# Patient Record
Sex: Male | Born: 1980 | Race: Black or African American | Hispanic: No | Marital: Married | Smoking: Former smoker
Health system: Southern US, Community
[De-identification: ages and names within clinical notes are randomized; demographics above are authoritative.]

## PROBLEM LIST (undated history)

## (undated) DIAGNOSIS — E291 Testicular hypofunction: Secondary | ICD-10-CM

## (undated) DIAGNOSIS — N3281 Overactive bladder: Secondary | ICD-10-CM

## (undated) DIAGNOSIS — B20 Human immunodeficiency virus [HIV] disease: Secondary | ICD-10-CM

## (undated) DIAGNOSIS — N400 Enlarged prostate without lower urinary tract symptoms: Secondary | ICD-10-CM

## (undated) DIAGNOSIS — G894 Chronic pain syndrome: Secondary | ICD-10-CM

## (undated) DIAGNOSIS — E669 Obesity, unspecified: Secondary | ICD-10-CM

## (undated) DIAGNOSIS — N529 Male erectile dysfunction, unspecified: Secondary | ICD-10-CM

## (undated) DIAGNOSIS — Z0282 Encounter for adoption services: Secondary | ICD-10-CM

## (undated) DIAGNOSIS — L408 Other psoriasis: Secondary | ICD-10-CM

## (undated) DIAGNOSIS — I5030 Unspecified diastolic (congestive) heart failure: Secondary | ICD-10-CM

## (undated) DIAGNOSIS — F329 Major depressive disorder, single episode, unspecified: Secondary | ICD-10-CM

## (undated) DIAGNOSIS — I119 Hypertensive heart disease without heart failure: Secondary | ICD-10-CM

## (undated) DIAGNOSIS — IMO0002 Reserved for concepts with insufficient information to code with codable children: Secondary | ICD-10-CM

## (undated) DIAGNOSIS — G40909 Epilepsy, unspecified, not intractable, without status epilepticus: Secondary | ICD-10-CM

## (undated) DIAGNOSIS — I509 Heart failure, unspecified: Secondary | ICD-10-CM

## (undated) DIAGNOSIS — E1149 Type 2 diabetes mellitus with other diabetic neurological complication: Secondary | ICD-10-CM

## (undated) DIAGNOSIS — G4730 Sleep apnea, unspecified: Secondary | ICD-10-CM

## (undated) DIAGNOSIS — G459 Transient cerebral ischemic attack, unspecified: Secondary | ICD-10-CM

## (undated) DIAGNOSIS — E0590 Thyrotoxicosis, unspecified without thyrotoxic crisis or storm: Secondary | ICD-10-CM

## (undated) DIAGNOSIS — Z789 Other specified health status: Secondary | ICD-10-CM

## (undated) DIAGNOSIS — N289 Disorder of kidney and ureter, unspecified: Secondary | ICD-10-CM

## (undated) DIAGNOSIS — E785 Hyperlipidemia, unspecified: Secondary | ICD-10-CM

## (undated) DIAGNOSIS — E119 Type 2 diabetes mellitus without complications: Secondary | ICD-10-CM

## (undated) DIAGNOSIS — Q743 Arthrogryposis multiplex congenita: Secondary | ICD-10-CM

## (undated) DIAGNOSIS — I43 Cardiomyopathy in diseases classified elsewhere: Secondary | ICD-10-CM

## (undated) DIAGNOSIS — N3289 Other specified disorders of bladder: Secondary | ICD-10-CM

## (undated) DIAGNOSIS — A6000 Herpesviral infection of urogenital system, unspecified: Secondary | ICD-10-CM

## (undated) DIAGNOSIS — J40 Bronchitis, not specified as acute or chronic: Secondary | ICD-10-CM

## (undated) DIAGNOSIS — Z21 Asymptomatic human immunodeficiency virus [HIV] infection status: Secondary | ICD-10-CM

## (undated) DIAGNOSIS — J309 Allergic rhinitis, unspecified: Secondary | ICD-10-CM

## (undated) DIAGNOSIS — Q688 Other specified congenital musculoskeletal deformities: Secondary | ICD-10-CM

## (undated) DIAGNOSIS — K219 Gastro-esophageal reflux disease without esophagitis: Secondary | ICD-10-CM

## (undated) DIAGNOSIS — K5732 Diverticulitis of large intestine without perforation or abscess without bleeding: Secondary | ICD-10-CM

## (undated) DIAGNOSIS — Z9289 Personal history of other medical treatment: Secondary | ICD-10-CM

## (undated) DIAGNOSIS — F419 Anxiety disorder, unspecified: Secondary | ICD-10-CM

## (undated) DIAGNOSIS — M1990 Unspecified osteoarthritis, unspecified site: Secondary | ICD-10-CM

## (undated) DIAGNOSIS — IMO0001 Reserved for inherently not codable concepts without codable children: Secondary | ICD-10-CM

## (undated) DIAGNOSIS — F3289 Other specified depressive episodes: Secondary | ICD-10-CM

## (undated) HISTORY — DX: Transient cerebral ischemic attack, unspecified: G45.9

## (undated) HISTORY — DX: Asymptomatic human immunodeficiency virus (hiv) infection status: Z21

## (undated) HISTORY — PX: HAND SURGERY: SHX662

## (undated) HISTORY — PX: APPENDECTOMY: SHX54

## (undated) HISTORY — DX: Other specified disorders of bladder: N32.89

## (undated) HISTORY — DX: Unspecified osteoarthritis, unspecified site: M19.90

## (undated) HISTORY — DX: Hypertensive heart disease without heart failure: I43

## (undated) HISTORY — DX: Reserved for inherently not codable concepts without codable children: IMO0001

## (undated) HISTORY — DX: Chronic pain syndrome: G89.4

## (undated) HISTORY — DX: Overactive bladder: N32.81

## (undated) HISTORY — DX: Epilepsy, unspecified, not intractable, without status epilepticus: G40.909

## (undated) HISTORY — DX: Other specified congenital musculoskeletal deformities: Q68.8

## (undated) HISTORY — DX: Allergic rhinitis, unspecified: J30.9

## (undated) HISTORY — DX: Essential (primary) hypertension: I10

## (undated) HISTORY — DX: Obesity, unspecified: E66.9

## (undated) HISTORY — PX: ORTHOPEDIC SURGERY: SHX850

## (undated) HISTORY — DX: Other specified health status: Z78.9

## (undated) HISTORY — DX: Other psoriasis: L40.8

## (undated) HISTORY — DX: Reserved for concepts with insufficient information to code with codable children: IMO0002

## (undated) HISTORY — DX: Unspecified diastolic (congestive) heart failure: I50.30

## (undated) HISTORY — DX: Major depressive disorder, single episode, unspecified: F32.9

## (undated) HISTORY — DX: Benign prostatic hyperplasia without lower urinary tract symptoms: N40.0

## (undated) HISTORY — DX: Encounter for adoption services: Z02.82

## (undated) HISTORY — DX: Personal history of other medical treatment: Z92.89

## (undated) HISTORY — PX: CORNEAL TRANSPLANT: SHX108

## (undated) HISTORY — DX: Anxiety disorder, unspecified: F41.9

## (undated) HISTORY — DX: Thyrotoxicosis, unspecified without thyrotoxic crisis or storm: E05.90

## (undated) HISTORY — PX: COLOSTOMY: SHX63

## (undated) HISTORY — DX: Sleep apnea, unspecified: G47.30

## (undated) HISTORY — DX: Male erectile dysfunction, unspecified: N52.9

## (undated) HISTORY — DX: Hyperlipidemia, unspecified: E78.5

## (undated) HISTORY — DX: Testicular hypofunction: E29.1

## (undated) HISTORY — DX: Diverticulitis of large intestine without perforation or abscess without bleeding: K57.32

## (undated) HISTORY — DX: Other specified depressive episodes: F32.89

## (undated) HISTORY — PX: OTHER SURGICAL HISTORY: SHX169

## (undated) HISTORY — DX: Herpesviral infection of urogenital system, unspecified: A60.00

## (undated) HISTORY — DX: Type 2 diabetes mellitus with other diabetic neurological complication: E11.49

## (undated) HISTORY — DX: Hypertensive heart disease without heart failure: I11.9

## (undated) HISTORY — DX: Human immunodeficiency virus (HIV) disease: B20

## (undated) HISTORY — DX: Gastro-esophageal reflux disease without esophagitis: K21.9

---

## 2003-12-19 ENCOUNTER — Emergency Department: Payer: Self-pay | Admitting: Unknown Physician Specialty

## 2004-08-08 ENCOUNTER — Emergency Department: Payer: Self-pay | Admitting: Unknown Physician Specialty

## 2004-10-02 ENCOUNTER — Emergency Department: Payer: Self-pay | Admitting: Emergency Medicine

## 2004-12-11 ENCOUNTER — Emergency Department: Payer: Self-pay | Admitting: Unknown Physician Specialty

## 2005-05-02 ENCOUNTER — Emergency Department: Payer: Self-pay | Admitting: Unknown Physician Specialty

## 2005-06-18 ENCOUNTER — Emergency Department: Payer: Self-pay | Admitting: Emergency Medicine

## 2005-06-27 DIAGNOSIS — I10 Essential (primary) hypertension: Secondary | ICD-10-CM | POA: Insufficient documentation

## 2005-07-04 ENCOUNTER — Emergency Department: Payer: Self-pay | Admitting: Internal Medicine

## 2005-08-03 ENCOUNTER — Emergency Department: Payer: Self-pay | Admitting: Unknown Physician Specialty

## 2005-08-21 ENCOUNTER — Emergency Department: Payer: Self-pay | Admitting: Emergency Medicine

## 2005-08-22 ENCOUNTER — Emergency Department: Payer: Self-pay | Admitting: Unknown Physician Specialty

## 2006-02-14 ENCOUNTER — Emergency Department: Payer: Self-pay | Admitting: Emergency Medicine

## 2006-06-06 ENCOUNTER — Emergency Department: Payer: Self-pay

## 2006-07-28 ENCOUNTER — Emergency Department: Payer: Self-pay | Admitting: Emergency Medicine

## 2006-08-28 DIAGNOSIS — E1169 Type 2 diabetes mellitus with other specified complication: Secondary | ICD-10-CM | POA: Diagnosis present

## 2007-02-12 ENCOUNTER — Emergency Department: Payer: Self-pay | Admitting: Emergency Medicine

## 2007-05-14 ENCOUNTER — Emergency Department: Payer: Self-pay | Admitting: Emergency Medicine

## 2007-09-14 ENCOUNTER — Emergency Department: Payer: Self-pay | Admitting: Emergency Medicine

## 2007-10-05 ENCOUNTER — Emergency Department: Payer: Self-pay | Admitting: Emergency Medicine

## 2007-10-17 ENCOUNTER — Emergency Department: Payer: Self-pay

## 2007-11-27 ENCOUNTER — Emergency Department: Payer: Self-pay | Admitting: Emergency Medicine

## 2007-12-28 ENCOUNTER — Emergency Department: Payer: Self-pay | Admitting: Emergency Medicine

## 2008-01-18 DIAGNOSIS — J45901 Unspecified asthma with (acute) exacerbation: Secondary | ICD-10-CM | POA: Diagnosis present

## 2008-02-15 ENCOUNTER — Emergency Department: Payer: Self-pay | Admitting: Emergency Medicine

## 2008-02-21 ENCOUNTER — Emergency Department: Payer: Self-pay | Admitting: Emergency Medicine

## 2008-02-22 ENCOUNTER — Emergency Department: Payer: Self-pay | Admitting: Emergency Medicine

## 2008-05-20 ENCOUNTER — Emergency Department: Payer: Self-pay | Admitting: Emergency Medicine

## 2008-05-27 ENCOUNTER — Emergency Department: Payer: Self-pay | Admitting: Emergency Medicine

## 2008-06-26 ENCOUNTER — Emergency Department: Payer: Self-pay | Admitting: Emergency Medicine

## 2008-06-28 ENCOUNTER — Emergency Department: Payer: Self-pay | Admitting: Emergency Medicine

## 2008-08-16 ENCOUNTER — Emergency Department: Payer: Self-pay | Admitting: Emergency Medicine

## 2008-09-10 ENCOUNTER — Emergency Department: Payer: Self-pay | Admitting: Emergency Medicine

## 2008-10-08 ENCOUNTER — Emergency Department: Payer: Self-pay | Admitting: Emergency Medicine

## 2008-10-16 ENCOUNTER — Observation Stay: Payer: Self-pay | Admitting: Student

## 2008-10-31 ENCOUNTER — Emergency Department: Payer: Self-pay | Admitting: Emergency Medicine

## 2008-12-05 ENCOUNTER — Ambulatory Visit: Payer: Self-pay | Admitting: Cardiology

## 2008-12-05 ENCOUNTER — Inpatient Hospital Stay: Payer: Self-pay | Admitting: Internal Medicine

## 2008-12-22 ENCOUNTER — Ambulatory Visit: Payer: Self-pay | Admitting: Internal Medicine

## 2008-12-24 ENCOUNTER — Emergency Department: Payer: Self-pay | Admitting: Unknown Physician Specialty

## 2008-12-30 ENCOUNTER — Emergency Department: Payer: Self-pay | Admitting: Emergency Medicine

## 2009-03-26 ENCOUNTER — Emergency Department: Payer: Self-pay | Admitting: Emergency Medicine

## 2009-06-26 ENCOUNTER — Emergency Department: Payer: Self-pay | Admitting: Emergency Medicine

## 2009-07-10 ENCOUNTER — Emergency Department: Payer: Self-pay | Admitting: Emergency Medicine

## 2009-11-02 ENCOUNTER — Emergency Department (HOSPITAL_COMMUNITY): Admission: EM | Admit: 2009-11-02 | Discharge: 2009-11-03 | Payer: Self-pay | Admitting: Emergency Medicine

## 2010-02-11 ENCOUNTER — Emergency Department: Payer: Self-pay | Admitting: Emergency Medicine

## 2010-02-25 ENCOUNTER — Emergency Department: Payer: Self-pay | Admitting: Emergency Medicine

## 2010-03-24 ENCOUNTER — Inpatient Hospital Stay: Payer: Self-pay | Admitting: *Deleted

## 2010-03-31 ENCOUNTER — Emergency Department: Payer: Self-pay | Admitting: Emergency Medicine

## 2010-04-24 ENCOUNTER — Emergency Department: Payer: Self-pay | Admitting: Emergency Medicine

## 2010-04-29 ENCOUNTER — Emergency Department: Payer: Self-pay | Admitting: Emergency Medicine

## 2010-05-04 ENCOUNTER — Inpatient Hospital Stay: Payer: Self-pay | Admitting: Internal Medicine

## 2010-11-24 ENCOUNTER — Emergency Department: Payer: Self-pay | Admitting: Emergency Medicine

## 2010-12-16 ENCOUNTER — Inpatient Hospital Stay: Payer: Self-pay | Admitting: Specialist

## 2010-12-27 ENCOUNTER — Inpatient Hospital Stay: Payer: Self-pay | Admitting: *Deleted

## 2011-02-09 ENCOUNTER — Emergency Department: Payer: Self-pay | Admitting: Unknown Physician Specialty

## 2011-02-09 LAB — CBC
HCT: 43.8 % (ref 40.0–52.0)
MCH: 27.6 pg (ref 26.0–34.0)
MCHC: 32.8 g/dL (ref 32.0–36.0)
MCV: 84 fL (ref 80–100)
Platelet: 240 10*3/uL (ref 150–440)
RDW: 13.8 % (ref 11.5–14.5)

## 2011-02-09 LAB — BASIC METABOLIC PANEL
Anion Gap: 9 (ref 7–16)
BUN: 11 mg/dL (ref 7–18)
Calcium, Total: 8.9 mg/dL (ref 8.5–10.1)
Chloride: 100 mmol/L (ref 98–107)
Co2: 31 mmol/L (ref 21–32)
EGFR (African American): >60
EGFR (Non-African Amer.): >60
Glucose: 237 mg/dL — ABNORMAL HIGH (ref 65–99)
Osmolality: 286 (ref 275–301)
Potassium: 3.3 mmol/L — ABNORMAL LOW (ref 3.5–5.1)
Sodium: 140 mmol/L (ref 136–145)

## 2011-02-09 LAB — URINALYSIS, COMPLETE
Ketone: NEGATIVE
Ph: 7 (ref 4.5–8.0)
Protein: NEGATIVE
Specific Gravity: 1.015 (ref 1.003–1.030)
Squamous Epithelial: NONE SEEN
WBC UR: 1 /HPF (ref 0–5)

## 2011-02-10 LAB — URINE CULTURE

## 2011-04-17 ENCOUNTER — Emergency Department: Payer: Self-pay | Admitting: Emergency Medicine

## 2011-04-17 LAB — COMPREHENSIVE METABOLIC PANEL
Albumin: 3.9 g/dL (ref 3.4–5.0)
Anion Gap: 8 (ref 7–16)
BUN: 13 mg/dL (ref 7–18)
Calcium, Total: 9.2 mg/dL (ref 8.5–10.1)
Chloride: 104 mmol/L (ref 98–107)
Co2: 33 mmol/L — ABNORMAL HIGH (ref 21–32)
EGFR (African American): >60
EGFR (Non-African Amer.): >60
Glucose: 124 mg/dL — ABNORMAL HIGH (ref 65–99)
Osmolality: 290 (ref 275–301)
Potassium: 3.3 mmol/L — ABNORMAL LOW (ref 3.5–5.1)
SGOT(AST): 23 U/L (ref 15–37)
SGPT (ALT): 31 U/L
Sodium: 145 mmol/L (ref 136–145)
Total Protein: 6.8 g/dL (ref 6.4–8.2)

## 2011-04-17 LAB — URINALYSIS, COMPLETE
Glucose,UR: NEGATIVE mg/dL (ref 0–75)
Leukocyte Esterase: NEGATIVE
Nitrite: NEGATIVE
Ph: 6 (ref 4.5–8.0)
Protein: NEGATIVE
Specific Gravity: 1.025 (ref 1.003–1.030)
WBC UR: <1 /HPF (ref 0–5)

## 2011-04-17 LAB — CBC
HCT: 41.7 % (ref 40.0–52.0)
MCH: 27.8 pg (ref 26.0–34.0)
MCV: 85 fL (ref 80–100)
Platelet: 225 10*3/uL (ref 150–440)
RBC: 4.91 10*6/uL (ref 4.40–5.90)
RDW: 13.8 % (ref 11.5–14.5)

## 2011-04-27 ENCOUNTER — Emergency Department: Payer: Self-pay | Admitting: Emergency Medicine

## 2011-04-27 LAB — URINALYSIS, COMPLETE
Bilirubin,UR: NEGATIVE
Blood: NEGATIVE
Leukocyte Esterase: NEGATIVE
Ph: 6 (ref 4.5–8.0)
RBC,UR: 1 /HPF (ref 0–5)
Specific Gravity: 1.031 (ref 1.003–1.030)
Squamous Epithelial: 2

## 2011-04-27 LAB — BASIC METABOLIC PANEL
Anion Gap: 12 (ref 7–16)
BUN: 12 mg/dL (ref 7–18)
Calcium, Total: 9.1 mg/dL (ref 8.5–10.1)
Chloride: 101 mmol/L (ref 98–107)
Creatinine: 0.79 mg/dL (ref 0.60–1.30)
EGFR (Non-African Amer.): >60
Glucose: 69 mg/dL (ref 65–99)
Osmolality: 285 (ref 275–301)
Sodium: 144 mmol/L (ref 136–145)

## 2011-04-27 LAB — CBC
HGB: 14.3 g/dL (ref 13.0–18.0)
MCH: 27.9 pg (ref 26.0–34.0)
MCHC: 32.9 g/dL (ref 32.0–36.0)
MCV: 85 fL (ref 80–100)
RBC: 5.12 10*6/uL (ref 4.40–5.90)

## 2011-04-27 LAB — RAPID INFLUENZA A&B ANTIGENS

## 2011-05-30 ENCOUNTER — Encounter: Payer: Self-pay | Admitting: *Deleted

## 2011-05-30 ENCOUNTER — Emergency Department: Payer: Self-pay | Admitting: Emergency Medicine

## 2011-05-30 LAB — COMPREHENSIVE METABOLIC PANEL
Albumin: 4.3 g/dL (ref 3.4–5.0)
Alkaline Phosphatase: 64 U/L (ref 50–136)
Anion Gap: 7 (ref 7–16)
BUN: 7 mg/dL (ref 7–18)
Chloride: 103 mmol/L (ref 98–107)
Creatinine: 0.62 mg/dL (ref 0.60–1.30)
EGFR (African American): >60
Osmolality: 283 (ref 275–301)
Sodium: 142 mmol/L (ref 136–145)

## 2011-05-30 LAB — CBC
HGB: 14.1 g/dL (ref 13.0–18.0)
MCV: 84 fL (ref 80–100)
RBC: 5.19 10*6/uL (ref 4.40–5.90)
WBC: 5.7 10*3/uL (ref 3.8–10.6)

## 2011-05-30 LAB — CK TOTAL AND CKMB (NOT AT ARMC): CK-MB: 2.7 ng/mL (ref 0.5–3.6)

## 2011-05-31 ENCOUNTER — Ambulatory Visit: Payer: Self-pay | Admitting: Cardiovascular Disease

## 2011-06-01 ENCOUNTER — Emergency Department: Payer: Self-pay | Admitting: Emergency Medicine

## 2011-06-01 LAB — BASIC METABOLIC PANEL
Anion Gap: 11 (ref 7–16)
Calcium, Total: 8.7 mg/dL (ref 8.5–10.1)
Chloride: 104 mmol/L (ref 98–107)
Co2: 25 mmol/L (ref 21–32)
EGFR (African American): >60
Osmolality: 280 (ref 275–301)
Potassium: 3.2 mmol/L — ABNORMAL LOW (ref 3.5–5.1)
Sodium: 140 mmol/L (ref 136–145)

## 2011-06-01 LAB — CBC
HCT: 43.4 % (ref 40.0–52.0)
MCH: 27.8 pg (ref 26.0–34.0)
MCHC: 33.1 g/dL (ref 32.0–36.0)
MCV: 84 fL (ref 80–100)
Platelet: 289 10*3/uL (ref 150–440)
WBC: 5.3 10*3/uL (ref 3.8–10.6)

## 2011-06-01 LAB — TROPONIN I: Troponin-I: <0.02 ng/mL

## 2011-06-13 ENCOUNTER — Ambulatory Visit: Payer: Self-pay | Admitting: Pain Medicine

## 2011-06-20 ENCOUNTER — Ambulatory Visit (INDEPENDENT_AMBULATORY_CARE_PROVIDER_SITE_OTHER): Payer: Medicaid Other | Admitting: Cardiovascular Disease

## 2011-06-20 ENCOUNTER — Encounter: Payer: Self-pay | Admitting: Cardiovascular Disease

## 2011-06-20 DIAGNOSIS — M5382 Other specified dorsopathies, cervical region: Secondary | ICD-10-CM

## 2011-06-20 DIAGNOSIS — E1159 Type 2 diabetes mellitus with other circulatory complications: Secondary | ICD-10-CM | POA: Insufficient documentation

## 2011-06-20 DIAGNOSIS — I152 Hypertension secondary to endocrine disorders: Secondary | ICD-10-CM | POA: Insufficient documentation

## 2011-06-20 DIAGNOSIS — E119 Type 2 diabetes mellitus without complications: Secondary | ICD-10-CM

## 2011-06-20 DIAGNOSIS — R079 Chest pain, unspecified: Secondary | ICD-10-CM

## 2011-06-20 DIAGNOSIS — R0602 Shortness of breath: Secondary | ICD-10-CM

## 2011-06-20 DIAGNOSIS — R29898 Other symptoms and signs involving the musculoskeletal system: Secondary | ICD-10-CM

## 2011-06-20 DIAGNOSIS — I10 Essential (primary) hypertension: Secondary | ICD-10-CM

## 2011-06-20 DIAGNOSIS — E118 Type 2 diabetes mellitus with unspecified complications: Secondary | ICD-10-CM | POA: Insufficient documentation

## 2011-06-20 MED ORDER — ISOSORBIDE MONONITRATE ER 30 MG PO TB24: 30.0000 mg | ORAL_TABLET | Freq: Two times a day (BID) | ORAL | Status: DC

## 2011-06-20 NOTE — Assessment & Plan Note (Signed)
For his chest pain and blood pressure, we have suggested he start isosorbide mononitrate 30 mg daily. He will monitor his blood pressure and if it continues to be elevated, he will increase the isosorbide to twice a day.

## 2011-06-20 NOTE — Patient Instructions (Signed)
Please start isosorbide once a day Monitor your blood pressure for the next week. If it continues to run high in one week, start isosorbide twice a day  We will schedule you for a stress test at [facility]  Please call us if you have new issues that need to be addressed before your next appt.  Your physician wants you to follow-up in: 3 months.  You will receive a reminder letter in the mail two months in advance. If you don't receive a letter, please call our office to schedule the follow-up appointment.

## 2011-06-20 NOTE — Assessment & Plan Note (Signed)
Etiology of his chest pain is uncertain. He does have several risk factors for coronary artery disease including diabetes, hypertension. He is unable to treadmill and we will schedule him for a Myoview at his convenience.

## 2011-06-20 NOTE — Assessment & Plan Note (Signed)
Long history of diabetes. He does work out with [employer] at [employer]We have encouraged continued exercise, careful diet management in an effort to lose weight.

## 2011-06-20 NOTE — Progress Notes (Signed)
Patient ID: Cory Campbell, male    DOB: Dec 15, 1980, 31 y.o.   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 He presents for further evaluation of chest pain.  He reports that symptoms started approximately 2 months ago, occurred 3 times per week lasting 5-10 minutes at a time. Symptoms are sharp, come on at rest and sometimes with exertion. He describes the symptoms as very severe. He denies any significant lower extremity edema, wears braces in both legs, no significant lightheadedness, sweating.   Workup in the hospital was essentially negative with normal cardiac enzymes.  He reports that his biggest problem is his blood pressure which is always elevated  EKG shows normal sinus rhythm with rate 74 beats per minute poor R-wave progression through the anterior precordial leads, nonspecific ST and T wave abnormality   Outpatient Encounter Prescriptions as of 06/20/2011  Medication Sig Dispense Refill  . acetaminophen-codeine (TYLENOL #3) 300-30 MG per tablet Take 1 tablet by mouth every 4 (four) hours as needed.      Marland Kitchen albuterol (PROVENTIL HFA;VENTOLIN HFA) 108 (90 BASE) MCG/ACT inhaler Inhale 2 puffs into the lungs every 4 (four) hours as needed.      Marland Kitchen amLODipine (NORVASC) 10 MG tablet Take 10 mg by mouth daily.      Marland Kitchen aspirin 325 MG tablet Take 325 mg by mouth daily.      . beclomethasone (QVAR) 80 MCG/ACT inhaler Inhale 2 puffs into the lungs as needed.      . carbamazepine (CARBATROL) 200 MG 12 hr capsule Take one tablet in the am, one tablet in the afternoon and two tablets in the pm.      . cetirizine (ZYRTEC) 10 MG  chewable tablet Chew 10 mg by mouth daily.      . chlorthalidone (HYGROTON) 25 MG tablet Take 25 mg by mouth daily.      . clobetasol cream (TEMOVATE) 0.05 % Apply topically 2 (two) times daily.      . fluticasone (FLONASE) 50 MCG/ACT nasal spray Place 2 sprays into the nose daily.      Marland Kitchen glucose blood test strip 1 each by Other route as needed. Use as instructed      . hydrALAZINE (APRESOLINE) 50 MG tablet Take 50 mg by mouth 3 (three) times daily.      . insulin aspart (NOVOLOG FLEXPEN) 100 UNIT/ML injection Inject 15 Units into the skin 3 (three) times daily before meals. 15 units before lunch, 25 units before dinner and 5 units with snacks.      . insulin glargine (LANTUS) 100 UNIT/ML injection Inject 60 Units into the skin at bedtime.      . Insulin Pen Needle (NOVOFINE) 30G X 8 MM MISC Inject 1 packet into the skin 4 (four) times daily.      Marland Kitchen lisinopril (PRINIVIL,ZESTRIL) 40 MG tablet Take 40 mg by mouth daily.      . metoprolol (LOPRESSOR) 100 MG tablet Take 100 mg by mouth 2 (two) times daily.      . Multiple Vitamin (MULTIVITAMIN) tablet Take 1 tablet by mouth daily.      Marland Kitchen omeprazole (PRILOSEC) 20 MG capsule Take 20  mg by mouth daily.      Marland Kitchen oxybutynin (DITROPAN) 5 MG tablet Take 5 mg by mouth 4 (four) times daily.      . pravastatin (PRAVACHOL) 80 MG tablet Take 80 mg by mouth daily.      . sildenafil (VIAGRA) 100 MG tablet Take 50 mg by mouth daily as needed.      Marland Kitchen spironolactone (ALDACTONE) 25 MG tablet Take 25 mg by mouth daily.      . traZODone (DESYREL) 50 MG tablet Take 25 mg by mouth 2 (two) times daily.         Review of Systems  Constitutional: Negative.   HENT: Negative.   Eyes: Negative.   Respiratory: Negative.   Cardiovascular: Positive for chest pain.  Gastrointestinal: Negative.   Musculoskeletal: Positive for gait problem.       Severe leg pain, chronic  Skin: Negative.   Neurological: Negative.   Hematological: Negative.   Psychiatric/Behavioral:  Negative.   All other systems reviewed and are negative.    BP 159/105  Pulse 74  Ht 5\' 4"  (1.626 m)  Wt 213 lb (96.616 kg)  BMI 36.56 kg/m2  Physical Exam  Nursing note and vitals reviewed. Constitutional: He is oriented to person, place, and time. He appears well-developed and well-nourished.       Presents with arm crutches bilaterally  HENT:  Head: Normocephalic.  Nose: Nose normal.  Mouth/Throat: Oropharynx is clear and moist.  Eyes: Conjunctivae are normal. Pupils are equal, round, and reactive to light.  Neck: Normal range of motion. Neck supple. No JVD present.  Cardiovascular: Normal rate, regular rhythm, S1 normal, S2 normal, normal heart sounds and intact distal pulses.  Exam reveals no gallop and no friction rub.   No murmur heard. Pulmonary/Chest: Effort normal and breath sounds normal. No respiratory distress. He has no wheezes. He has no rales. He exhibits no tenderness.  Abdominal: Soft. Bowel sounds are normal. He exhibits no distension. There is no tenderness.  Musculoskeletal: He exhibits no edema and no tenderness.       In the legs, in braces bilaterally  Lymphadenopathy:    He has no cervical adenopathy.  Neurological: He is alert and oriented to person, place, and time. Coordination abnormal.  Skin: Skin is warm and dry. No rash noted. No erythema.  Psychiatric: He has a normal mood and affect. His behavior is normal. Judgment and thought content normal.           Assessment and Plan

## 2011-06-28 ENCOUNTER — Ambulatory Visit: Payer: Self-pay | Admitting: Cardiovascular Disease

## 2011-06-28 DIAGNOSIS — R079 Chest pain, unspecified: Secondary | ICD-10-CM

## 2011-06-30 ENCOUNTER — Telehealth: Payer: Self-pay | Admitting: Cardiovascular Disease

## 2011-06-30 NOTE — Telephone Encounter (Signed)
Pt called stating that his isosorbide is giving him a headache and wants to talk to the nurse about it

## 2011-06-30 NOTE — Telephone Encounter (Signed)
Pt c/o h/a since starting isosorbide QD. BP is better (120/86) but having a hard time tolerating h/a.  Has yet to try taking isosorbide BID.  I will discuss with Dr. Mariah Milling and see if he wants to make any changes and will then call patient back.  Understanding verb.

## 2011-06-30 NOTE — Telephone Encounter (Signed)
He could try cutting it in half to start for a while. Monitor BP on 1/2 pill Call back with numbers. See if his headache resolves.

## 2011-06-30 NOTE — Telephone Encounter (Signed)
Please review below and advise.  Thanks!

## 2011-07-04 ENCOUNTER — Telehealth: Payer: Self-pay

## 2011-07-04 NOTE — Telephone Encounter (Signed)
LMOM per Dr. Kizzie Furnish from [facility] looked normal.

## 2011-07-04 NOTE — Telephone Encounter (Signed)
Pt informed Understanding verb 

## 2011-07-04 NOTE — Telephone Encounter (Signed)
Pt unavail.

## 2011-07-10 ENCOUNTER — Other Ambulatory Visit: Payer: Self-pay | Admitting: Cardiovascular Disease

## 2011-07-10 DIAGNOSIS — R0602 Shortness of breath: Secondary | ICD-10-CM

## 2011-07-24 ENCOUNTER — Ambulatory Visit: Payer: Self-pay | Admitting: Neurology

## 2011-09-06 ENCOUNTER — Ambulatory Visit: Payer: Self-pay | Admitting: Gastroenterology

## 2011-09-22 ENCOUNTER — Ambulatory Visit: Payer: Medicaid Other | Admitting: Cardiovascular Disease

## 2011-09-28 ENCOUNTER — Ambulatory Visit: Payer: Medicaid Other | Admitting: Cardiovascular Disease

## 2011-11-07 ENCOUNTER — Ambulatory Visit: Payer: Medicaid Other | Admitting: Cardiovascular Disease

## 2011-11-15 ENCOUNTER — Ambulatory Visit (INDEPENDENT_AMBULATORY_CARE_PROVIDER_SITE_OTHER): Payer: Medicaid Other | Admitting: Cardiovascular Disease

## 2011-11-15 ENCOUNTER — Encounter: Payer: Self-pay | Admitting: Cardiovascular Disease

## 2011-11-15 VITALS — BP 142/82 | HR 64 | Ht 66.0 in | Wt 202.8 lb

## 2011-11-15 DIAGNOSIS — R079 Chest pain, unspecified: Secondary | ICD-10-CM

## 2011-11-15 DIAGNOSIS — I10 Essential (primary) hypertension: Secondary | ICD-10-CM

## 2011-11-15 DIAGNOSIS — E119 Type 2 diabetes mellitus without complications: Secondary | ICD-10-CM

## 2011-11-15 MED ORDER — HYDRALAZINE HCL 100 MG PO TABS: 100.0000 mg | ORAL_TABLET | Freq: Three times a day (TID) | ORAL | Status: DC

## 2011-11-15 MED ORDER — HYDRALAZINE HCL 100 MG PO TABS: 50.0000 mg | ORAL_TABLET | Freq: Three times a day (TID) | ORAL | Status: DC

## 2011-11-15 NOTE — Assessment & Plan Note (Signed)
Blood pressure continues to be mildly elevated. We will increase the hydralazine to 100 mg 3 times a day.

## 2011-11-15 NOTE — Assessment & Plan Note (Signed)
No recent episodes of chest pain. No further workup at this time 

## 2011-11-15 NOTE — Patient Instructions (Addendum)
You are doing well. Please increase the hydralazine to 100 mg three times a day  Please call us if you have new issues that need to be addressed before your next appt.  Your physician wants you to follow-up in: 12 months.  You will receive a reminder letter in the mail two months in advance. If you don't receive a letter, please call our office to schedule the follow-up appointment.

## 2011-11-15 NOTE — Progress Notes (Signed)
Patient ID: Cory Campbell, male    DOB: May 11, 1980, 31 y.o.   MRN: [id number]  HPI Comments: Cory Campbell is a 31 year old gentleman with a history of Arthrogryposis multiplex congenita, chronic pain in his legs, asthma, hypertension that has been challenging to control, diabetes, obstructive sleep apnea that is severe on CPAP since April 2012, obesity, seizure disorder, psoriasis with several  evaluations in the emergency room for chest pain on April 23, 06/01/2011. He presents for routine followup.  He denies any further episodes of chest pain. He did go to the emergency room 2 months ago at [facility] for abdominal pain. He was started on MiraLAX for constipation. Since then he has been feeling well. He denies any significant shortness of breath. Rare extremity edema improved with leg elevation. He reports that his diabetes is relatively well controlled if he follows his diet. He reports blood pressure continues to be elevated.  EKG shows normal sinus rhythm with rate 64 beats per minute poor R-wave progression through the anterior precordial leads, nonspecific ST and T wave abnormality   Outpatient Encounter Prescriptions as of 11/15/2011  Medication Sig Dispense Refill  . albuterol (PROVENTIL HFA;VENTOLIN HFA) 108 (90 BASE) MCG/ACT inhaler Inhale 2 puffs into the lungs every 4 (four) hours as needed.      . ALBUTEROL SULFATE PO 2.5mg /62mL as needed.      Marland Kitchen amLODipine (NORVASC) 10 MG tablet Take 10 mg by mouth daily.      Marland Kitchen aspirin 81 MG tablet Take 81 mg by mouth daily.      . beclomethasone (QVAR) 80 MCG/ACT inhaler Inhale 2 puffs into the lungs as needed.      . carbamazepine (CARBATROL) 200 MG 12 hr capsule 2 (two) times daily.       . cetirizine (ZYRTEC) 10 MG chewable tablet Chew 10 mg by mouth daily.      . chlorthalidone (HYGROTON) 25 MG tablet Take 25 mg by mouth daily.      . citalopram (CELEXA) 20 MG tablet Take 20 mg by mouth daily.      . clobetasol cream (TEMOVATE) 0.05 % Apply  topically 2 (two) times daily.      . cyclobenzaprine (FLEXERIL) 10 MG tablet Take 10 mg by mouth as needed.      . dicyclomine (BENTYL) 20 MG tablet Take 20 mg by mouth every 6 (six) hours.      . DULoxetine (CYMBALTA) 20 MG capsule Take 20 mg by mouth daily.      . fluticasone (FLONASE) 50 MCG/ACT nasal spray Place 2 sprays into the nose daily.      Marland Kitchen glucose blood test strip 1 each by Other route as needed. Use as instructed      . insulin aspart (NOVOLOG FLEXPEN) 100 UNIT/ML injection Inject 15 Units into the skin 3 (three) times daily before meals. 15 units before lunch, 25 units before dinner and 5 units with snacks.      . insulin glargine (LANTUS) 100 UNIT/ML injection Inject 60 Units into the skin at bedtime.      . Insulin Pen Needle (NOVOFINE) 30G X 8 MM MISC Inject 1 packet into the skin 4 (four) times daily.      . isosorbide mononitrate (IMDUR) 30 MG 24 hr tablet Take 1 tablet (30 mg total) by mouth 2 (two) times daily.  60 tablet  6  . lisinopril (PRINIVIL,ZESTRIL) 40 MG tablet Take 40 mg by mouth daily.      . metoprolol (LOPRESSOR)  100 MG tablet Take 100 mg by mouth 2 (two) times daily.      . Multiple Vitamin (MULTIVITAMIN) tablet Take 1 tablet by mouth daily.      . naproxen (NAPROSYN) 250 MG tablet Take 250 mg by mouth daily.      Marland Kitchen omeprazole (PRILOSEC) 20 MG capsule Take 20 mg by mouth daily.      Marland Kitchen oxybutynin (DITROPAN) 5 MG tablet Takes 1/2 tablet every 8 hours.      . polyethylene glycol (MIRALAX / GLYCOLAX) packet Take 17 g by mouth daily.      . pravastatin (PRAVACHOL) 80 MG tablet Take 80 mg by mouth daily.      Marland Kitchen senna (SENOKOT) 8.6 MG tablet 2 tablets at bedtime.      . sildenafil (VIAGRA) 100 MG tablet Take 50 mg by mouth daily as needed.      Marland Kitchen spironolactone (ALDACTONE) 25 MG tablet Take 25 mg by mouth daily.      . traZODone (DESYREL) 50 MG tablet Takes 1/2- 2 tablets at bedtime.      . triamcinolone ointment (KENALOG) 0.5 % Apply topically 2 (two) times daily.       .  hydrALAZINE (APRESOLINE) 50 MG tablet Take 50 mg by mouth 3 (three) times daily.         Review of Systems  Constitutional: Negative.   HENT: Negative.   Eyes: Negative.   Respiratory: Negative.   Gastrointestinal: Negative.   Musculoskeletal: Positive for gait problem.       Severe leg pain, chronic  Skin: Negative.   Neurological: Negative.   Hematological: Negative.   Psychiatric/Behavioral: Negative.   All other systems reviewed and are negative.    BP 142/82  Pulse 64  Ht 5\' 6"  (1.676 m)  Wt 202 lb 12 oz (91.967 kg)  BMI 32.72 kg/m2  Physical Exam  Nursing note and vitals reviewed. Constitutional: He is oriented to person, place, and time. He appears well-developed and well-nourished.       Presents with arm crutches bilaterally  HENT:  Head: Normocephalic.  Nose: Nose normal.  Mouth/Throat: Oropharynx is clear and moist.  Eyes: Conjunctivae normal are normal. Pupils are equal, round, and reactive to light.  Neck: Normal range of motion. Neck supple. No JVD present.  Cardiovascular: Normal rate, regular rhythm, S1 normal, S2 normal, normal heart sounds and intact distal pulses.  Exam reveals no gallop and no friction rub.   No murmur heard. Pulmonary/Chest: Effort normal and breath sounds normal. No respiratory distress. He has no wheezes. He has no rales. He exhibits no tenderness.  Abdominal: Soft. Bowel sounds are normal. He exhibits no distension. There is no tenderness.  Musculoskeletal: He exhibits no edema and no tenderness.       In the legs, in braces bilaterally  Lymphadenopathy:    He has no cervical adenopathy.  Neurological: He is alert and oriented to person, place, and time. Coordination abnormal.  Skin: Skin is warm and dry. No rash noted. No erythema.  Psychiatric: He has a normal mood and affect. His behavior is normal. Judgment and thought content normal.           Assessment and Plan

## 2011-11-15 NOTE — Assessment & Plan Note (Signed)
He reports that his diabetes is relatively well controlled. No recent lab work is available. We have encouraged continued exercise, careful diet management in an effort to lose weight.

## 2011-12-13 ENCOUNTER — Ambulatory Visit: Payer: Self-pay | Admitting: Family Medicine

## 2012-01-24 ENCOUNTER — Ambulatory Visit: Payer: Self-pay | Admitting: Gastroenterology

## 2012-01-26 LAB — PATHOLOGY REPORT

## 2012-02-09 ENCOUNTER — Emergency Department: Payer: Self-pay | Admitting: Unknown Physician Specialty

## 2012-02-09 LAB — URINALYSIS, COMPLETE
Bacteria: NONE SEEN
Bilirubin,UR: NEGATIVE
Blood: NEGATIVE
Glucose,UR: NEGATIVE mg/dL (ref 0–75)
Nitrite: NEGATIVE
Protein: NEGATIVE
RBC,UR: 2 /HPF (ref 0–5)
WBC UR: 5 /HPF (ref 0–5)

## 2012-02-09 LAB — BASIC METABOLIC PANEL
Co2: 25 mmol/L (ref 21–32)
EGFR (Non-African Amer.): >60
Glucose: 206 mg/dL — ABNORMAL HIGH (ref 65–99)
Osmolality: 277 (ref 275–301)
Potassium: 3.9 mmol/L (ref 3.5–5.1)

## 2012-02-09 LAB — CBC
HCT: 43.6 % (ref 40.0–52.0)
MCHC: 34.1 g/dL (ref 32.0–36.0)
MCV: 85 fL (ref 80–100)
Platelet: 258 10*3/uL (ref 150–440)
WBC: 8.2 10*3/uL (ref 3.8–10.6)

## 2012-02-09 LAB — HEPATIC FUNCTION PANEL A (ARMC)
Alkaline Phosphatase: 78 U/L (ref 50–136)
Bilirubin,Total: 0.6 mg/dL (ref 0.2–1.0)
SGOT(AST): 38 U/L — ABNORMAL HIGH (ref 15–37)
SGPT (ALT): 38 U/L (ref 12–78)
Total Protein: 7.7 g/dL (ref 6.4–8.2)

## 2012-02-09 LAB — DRUG SCREEN, URINE
Barbiturates, Ur Screen: NEGATIVE (ref ?–200)
Cannabinoid 50 Ng, Ur ~~LOC~~: NEGATIVE (ref ?–50)
Cocaine Metabolite,Ur ~~LOC~~: NEGATIVE (ref ?–300)
MDMA (Ecstasy)Ur Screen: NEGATIVE (ref ?–500)
Phencyclidine (PCP) Ur S: NEGATIVE (ref ?–25)
Tricyclic, Ur Screen: NEGATIVE (ref ?–1000)

## 2012-02-09 LAB — MAGNESIUM: Magnesium: 1.7 mg/dL — ABNORMAL LOW

## 2012-02-09 LAB — TROPONIN I: Troponin-I: <0.02 ng/mL

## 2012-02-09 LAB — CK TOTAL AND CKMB (NOT AT ARMC): CK, Total: 229 U/L (ref 35–232)

## 2012-02-21 ENCOUNTER — Telehealth: Payer: Self-pay | Admitting: *Deleted

## 2012-02-21 ENCOUNTER — Other Ambulatory Visit: Payer: Self-pay

## 2012-02-21 MED ORDER — NITROGLYCERIN 0.4 MG SL SUBL: 0.4000 mg | SUBLINGUAL_TABLET | SUBLINGUAL | Status: DC | PRN

## 2012-02-21 NOTE — Telephone Encounter (Signed)
Line busy

## 2012-02-21 NOTE — Telephone Encounter (Signed)
lmtcb

## 2012-02-21 NOTE — Telephone Encounter (Signed)
Pt c/o sharp intermittent CP that is associated with diaphoresis Says these began last week Worse with exertion Denies active symptoms at this time Has appt with [place] next week ]asks if he should be seen sooner/make any changes prior to appt I told him I will discuss with Dr. Mariah Milling and call him back Understanding verb

## 2012-02-21 NOTE — Telephone Encounter (Signed)
I discussed with Dr. Lewie Loron who suggests ok to wait until Tuesday 02/27/12 to be seen Asks to give pt RX for "NTG 0.4 mg SL to use PRN episodes" VO Dr. Alvis Lemmings, RN Dr. Mariah Milling mentions possible esophageal spasm or other GI issue He is reassured by negative stress test performed in May 2013. I will call pt to inform

## 2012-02-21 NOTE — Telephone Encounter (Signed)
Pt informed Understanding verb New RX sent to pharm for NTG He was instructed on how to take this med and to call 911 should he require 3 SL NTG with no relief Understanding verb

## 2012-02-21 NOTE — Telephone Encounter (Signed)
Pt calling stating that he is having CP and sweats. Last time was yesterday. No nitro. Pt not on any medication for CP. No other symptoms.

## 2012-02-21 NOTE — Telephone Encounter (Signed)
Attempted to reach pt on alternate # # has been temporarily disconnected

## 2012-02-26 ENCOUNTER — Other Ambulatory Visit: Payer: Self-pay | Admitting: Cardiovascular Disease

## 2012-02-26 NOTE — Telephone Encounter (Signed)
Refilled Isosorbide #[id number] refill#6 sent to [facility].

## 2012-02-27 ENCOUNTER — Encounter: Payer: Self-pay | Admitting: Cardiovascular Disease

## 2012-02-27 ENCOUNTER — Ambulatory Visit (INDEPENDENT_AMBULATORY_CARE_PROVIDER_SITE_OTHER): Payer: Medicaid Other | Admitting: Cardiovascular Disease

## 2012-02-27 VITALS — BP 120/80 | HR 84 | Ht 66.0 in | Wt 200.5 lb

## 2012-02-27 DIAGNOSIS — R079 Chest pain, unspecified: Secondary | ICD-10-CM

## 2012-02-27 DIAGNOSIS — R0602 Shortness of breath: Secondary | ICD-10-CM

## 2012-02-27 DIAGNOSIS — E119 Type 2 diabetes mellitus without complications: Secondary | ICD-10-CM

## 2012-02-27 DIAGNOSIS — I10 Essential (primary) hypertension: Secondary | ICD-10-CM

## 2012-02-27 NOTE — Assessment & Plan Note (Signed)
Blood pressure is well controlled on today's visit. No changes made to the medications. 

## 2012-02-27 NOTE — Assessment & Plan Note (Signed)
Atypical type chest pain. Negative stress test 6 months ago. We have suggested he hold his statin to make sure he is not having myalgias. He would like to try ranexa which we are using in his mother. He will try this for a short course of 2 weeks. If no improvement of his symptoms, we will hold the ranexa.  If chest pain persists despite our medical management, only other option would be cardiac catheterization to rule out underlying coronary disease. He does have underlying diabetes and numerous other medical issues which would put him at risk.

## 2012-02-27 NOTE — Progress Notes (Signed)
Patient ID: Cory Campbell, male    DOB: 1980/12/08, 32 y.o.   MRN: [id number]  HPI Comments: Mr. Brabson is a 32 year old gentleman with a history of Arthrogryposis multiplex congenita, chronic pain in his legs, asthma, hypertension that has been challenging to control, diabetes, obstructive sleep apnea that is severe on CPAP since April 2012, obesity, seizure disorder, psoriasis with several  evaluations in the emergency room for chest pain on April 23, 06/01/2011. He presents for routine followup.   Prior visits to the emergency room several months ago at [facility] for abdominal pain. He was started on MiraLAX for constipation. Recent visit to the emergency room for chest pain on 02/09/2012. Workup was essentially negative. Chest pain radiates to the right, the left, up to the shoulder, sometimes downward. Symptoms present at rest and with exertion.  Blood pressure has been relatively stable.  Glucose level was high in the hospital, greater than 200 Since his discharge several weeks ago, he has continued to have waxing waning chest pain on the left, sometimes worse with palpation of the left pectoral region. Symptoms seem to come on sometimes with exertion, sometimes with rest  EKG shows normal sinus rhythm with rate 84 beats per minute, nonspecific ST and T wave abnormality   EKG on 02/09/2012 in the hospital showed normal sinus rhythm with rate 86 beats per minute with PVCs otherwise no significant ST or T wave changes   Outpatient Encounter Prescriptions as of 02/27/2012  Medication Sig Dispense Refill  . albuterol (PROVENTIL HFA;VENTOLIN HFA) 108 (90 BASE) MCG/ACT inhaler Inhale 2 puffs into the lungs every 4 (four) hours as needed.      . ALBUTEROL SULFATE PO 2.5mg /55mL as needed.      Marland Kitchen amLODipine (NORVASC) 10 MG tablet Take 10 mg by mouth daily.      Marland Kitchen aspirin 81 MG tablet Take 81 mg by mouth daily.      . beclomethasone (QVAR) 80 MCG/ACT inhaler Inhale 2 puffs into the lungs as needed.        . carbamazepine (CARBATROL) 200 MG 12 hr capsule 2 (two) times daily.       . cetirizine (ZYRTEC) 10 MG chewable tablet Chew 10 mg by mouth daily.      . chlorthalidone (HYGROTON) 25 MG tablet Take 25 mg by mouth daily.      . citalopram (CELEXA) 20 MG tablet Take 20 mg by mouth daily.      . clobetasol cream (TEMOVATE) 0.05 % Apply topically 2 (two) times daily.      . cyclobenzaprine (FLEXERIL) 10 MG tablet Take 10 mg by mouth as needed.      . dicyclomine (BENTYL) 20 MG tablet Take 20 mg by mouth every 6 (six) hours.      . DULoxetine (CYMBALTA) 20 MG capsule Take 20 mg by mouth daily.      . fluticasone (FLONASE) 50 MCG/ACT nasal spray Place 2 sprays into the nose daily.      Marland Kitchen glucose blood test strip 1 each by Other route as needed. Use as instructed      . hydrALAZINE (APRESOLINE) 100 MG tablet Take 1 tablet (100 mg total) by mouth 3 (three) times daily.  90 tablet  6  . insulin aspart (NOVOLOG FLEXPEN) 100 UNIT/ML injection Inject 15 Units into the skin 3 (three) times daily before meals. 15 units before lunch, 25 units before dinner and 5 units with snacks.      . insulin glargine (LANTUS) 100 UNIT/ML  injection Inject 60 Units into the skin at bedtime.      . Insulin Pen Needle (NOVOFINE) 30G X 8 MM MISC Inject 1 packet into the skin 4 (four) times daily.      . isosorbide mononitrate (IMDUR) 30 MG 24 hr tablet take 1 tablet by mouth twice a day  60 tablet  6  . lisinopril (PRINIVIL,ZESTRIL) 40 MG tablet Take 40 mg by mouth daily.      . metoprolol (LOPRESSOR) 100 MG tablet Take 100 mg by mouth 2 (two) times daily.      . Multiple Vitamin (MULTIVITAMIN) tablet Take 1 tablet by mouth daily.      . naproxen (NAPROSYN) 250 MG tablet Take 250 mg by mouth daily.      . nitroGLYCERIN (NITROSTAT) 0.4 MG SL tablet Place 1 tablet (0.4 mg total) under the tongue every 5 (five) minutes as needed for chest pain.  25 tablet  3  . omeprazole (PRILOSEC) 20 MG capsule Take 20 mg by mouth daily.       Marland Kitchen oxybutynin (DITROPAN) 5 MG tablet Takes 1/2 tablet every 8 hours.      . polyethylene glycol (MIRALAX / GLYCOLAX) packet Take 17 g by mouth daily.      . pravastatin (PRAVACHOL) 80 MG tablet Take 80 mg by mouth daily.      Marland Kitchen senna (SENOKOT) 8.6 MG tablet 2 tablets at bedtime.      . sildenafil (VIAGRA) 100 MG tablet Take 50 mg by mouth daily as needed.      Marland Kitchen spironolactone (ALDACTONE) 25 MG tablet Take 25 mg by mouth daily.      . traZODone (DESYREL) 50 MG tablet Takes 1/2- 2 tablets at bedtime.      . triamcinolone ointment (KENALOG) 0.5 % Apply topically 2 (two) times daily.          Review of Systems  Constitutional: Negative.   HENT: Negative.   Eyes: Negative.   Respiratory: Positive for chest tightness.   Gastrointestinal: Negative.   Musculoskeletal: Positive for gait problem.       Severe leg pain, chronic  Skin: Negative.   Neurological: Negative.   Hematological: Negative.   Psychiatric/Behavioral: Negative.   All other systems reviewed and are negative.    BP 120/80  Pulse 84  Ht 5\' 6"  (1.676 m)  Wt 200 lb 8 oz (90.946 kg)  BMI 32.36 kg/m2  Physical Exam  Nursing note and vitals reviewed. Constitutional: He is oriented to person, place, and time. He appears well-developed and well-nourished.       Presents with arm crutches bilaterally  HENT:  Head: Normocephalic.  Nose: Nose normal.  Mouth/Throat: Oropharynx is clear and moist.  Eyes: Conjunctivae normal are normal. Pupils are equal, round, and reactive to light.  Neck: Normal range of motion. Neck supple. No JVD present.  Cardiovascular: Normal rate, regular rhythm, S1 normal, S2 normal, normal heart sounds and intact distal pulses.  Exam reveals no gallop and no friction rub.   No murmur heard. Pulmonary/Chest: Effort normal and breath sounds normal. No respiratory distress. He has no wheezes. He has no rales. He exhibits no tenderness.  Abdominal: Soft. Bowel sounds are normal. He exhibits no  distension. There is no tenderness.  Musculoskeletal: He exhibits no edema and no tenderness.       In the legs, in braces bilaterally  Lymphadenopathy:    He has no cervical adenopathy.  Neurological: He is alert and oriented to person, place, and  time. Coordination abnormal.  Skin: Skin is warm and dry. No rash noted. No erythema.  Psychiatric: He has a normal mood and affect. His behavior is normal. Judgment and thought content normal.           Assessment and Plan

## 2012-02-27 NOTE — Assessment & Plan Note (Signed)
We have encouraged continued exercise, careful diet management in an effort to lose weight. 

## 2012-02-27 NOTE — Patient Instructions (Addendum)
  Please hold the pravastatin for two weeks to see if chest pain improves We will try to obtain the cardiac cath report from [facility]  Please call us if you have new issues that need to be addressed before your next appt.  Your physician wants you to follow-up in: 3 months.  You will receive a reminder letter in the mail two months in advance. If you don't receive a letter, please call our office to schedule the follow-up appointment.

## 2012-03-12 ENCOUNTER — Telehealth: Payer: Self-pay

## 2012-03-12 NOTE — Telephone Encounter (Signed)
Pt called to say at last OV Dr. Mariah Milling started him on Ranexa. Says this med is working well for him and is ready for some samples and to sign indigent paperwork for Ranexa.  Pt says he was given samples of Ranexa 100 mg to take BID but I do not see dose documented. I will discuss with Dr. Mariah Milling first then told pt I will leave samples at [facility]. He verb understanding I will also have paperwork up front as well  Pt also mentions he has been holding pravastatin at Dr. Windell Hummingbird request for c/o muscle pain Says symptoms have improved and wants to know what Dr. Mariah Milling wants him on for cholesterol. I will discuss this as well with Dr. Mariah Milling.

## 2012-03-13 NOTE — Telephone Encounter (Signed)
Completed patient assistance program application for Ranexa. Will have pt complete financial info and will fax back in.

## 2012-03-19 ENCOUNTER — Emergency Department: Payer: Self-pay | Admitting: Emergency Medicine

## 2012-03-19 LAB — URINALYSIS, COMPLETE
Bacteria: NONE SEEN
Bilirubin,UR: NEGATIVE
Blood: NEGATIVE
Glucose,UR: 150 mg/dL (ref 0–75)
Ketone: NEGATIVE
Leukocyte Esterase: NEGATIVE
Nitrite: NEGATIVE
Ph: 5 (ref 4.5–8.0)
Protein: NEGATIVE
RBC,UR: 1 /HPF (ref 0–5)
Specific Gravity: 1.02 (ref 1.003–1.030)
Squamous Epithelial: <1
WBC UR: 1 /HPF (ref 0–5)

## 2012-03-20 ENCOUNTER — Telehealth: Payer: Self-pay

## 2012-03-20 NOTE — Telephone Encounter (Signed)
error 

## 2012-03-29 NOTE — Telephone Encounter (Signed)
Pt says you prescribed Ranexa last office visit Says we gave him samples I do not see this documented Says this is helping symptoms and needs more samples Should I provide him with 1000 mg tabs or 500 mg tabs He is unsure as to which dose we prescribed

## 2012-03-31 NOTE — Telephone Encounter (Signed)
Continue whatever dose is helping on ranexa bid (500 or 1000)  Hold statin for now, on next visit, will check lipids then decide

## 2012-04-01 ENCOUNTER — Other Ambulatory Visit: Payer: Self-pay

## 2012-04-01 MED ORDER — RANOLAZINE ER 1000 MG PO TB12: 1000.0000 mg | ORAL_TABLET | Freq: Two times a day (BID) | ORAL | Status: DC

## 2012-04-01 NOTE — Telephone Encounter (Signed)
I was able to speak with pt who says medicaid is now paying for meds and he no longer needs samples of ranexa/patient assistance program He is taking ranexa 1000 mg BID Will continue to hold statin and will check L/L in April at next appt

## 2012-06-05 ENCOUNTER — Ambulatory Visit: Payer: Medicaid Other | Admitting: Cardiovascular Disease

## 2012-06-07 ENCOUNTER — Encounter: Payer: Self-pay | Admitting: Cardiovascular Disease

## 2012-06-07 ENCOUNTER — Ambulatory Visit (INDEPENDENT_AMBULATORY_CARE_PROVIDER_SITE_OTHER): Payer: Medicaid Other | Admitting: Cardiovascular Disease

## 2012-06-07 VITALS — BP 140/86 | HR 67 | Ht 65.0 in | Wt 206.2 lb

## 2012-06-07 DIAGNOSIS — I10 Essential (primary) hypertension: Secondary | ICD-10-CM

## 2012-06-07 DIAGNOSIS — Z9989 Dependence on other enabling machines and devices: Secondary | ICD-10-CM

## 2012-06-07 DIAGNOSIS — G4730 Sleep apnea, unspecified: Secondary | ICD-10-CM | POA: Insufficient documentation

## 2012-06-07 DIAGNOSIS — E119 Type 2 diabetes mellitus without complications: Secondary | ICD-10-CM

## 2012-06-07 DIAGNOSIS — G4733 Obstructive sleep apnea (adult) (pediatric): Secondary | ICD-10-CM

## 2012-06-07 DIAGNOSIS — R079 Chest pain, unspecified: Secondary | ICD-10-CM

## 2012-06-07 MED ORDER — PRAVASTATIN SODIUM 80 MG PO TABS: 80.0000 mg | ORAL_TABLET | Freq: Every evening | ORAL | Status: DC

## 2012-06-07 NOTE — Assessment & Plan Note (Signed)
We have encouraged continued exercise, careful diet management in an effort to lose weight. 

## 2012-06-07 NOTE — Assessment & Plan Note (Signed)
Resolution of his chest tightness. Suspect this was atypical in nature. Better on ranexa. We'll restart cholesterol medication.

## 2012-06-07 NOTE — Assessment & Plan Note (Signed)
Encouraged him to stay on his CPAP

## 2012-06-07 NOTE — Progress Notes (Signed)
Patient ID: Cory Campbell, male    DOB: 25-Aug-1980, 32 y.o.   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 He presents for routine followup.  He reported chest pain on his last clinic visit. Cholesterol pill was held for possible myalgias, also started on ranexa. Now taking 1000 mg twice a day. Today he reports feeling much better. He did have a recent fall, hurt his left side including his arm and hip/leg. Leg brace was needing adjustment. He has plans for the summer including going to [place] and to [place]. Denies any leg edema, shortness of breath.     Prior visits to the emergency room several months ago at [facility] for abdominal pain. He was started on MiraLAX for constipation. Recent visit to the emergency room for chest pain on 02/09/2012. Workup was essentially negative. Chest pain radiates to the right, the left, up to the shoulder, sometimes downward. Symptoms present at rest and with exertion.  Blood pressure has been relatively stable.  Glucose level was high in the hospital, greater than 200  EKG shows normal sinus rhythm with rate 67 beats per minute, nonspecific ST and T wave abnormality   EKG on 02/09/2012 in the hospital showed normal sinus rhythm with rate 86 beats per minute with PVCs otherwise no significant ST or T wave changes   Outpatient Encounter Prescriptions as of 06/07/2012  Medication Sig Dispense Refill  . albuterol (PROVENTIL HFA;VENTOLIN HFA) 108 (90 BASE) MCG/ACT inhaler Inhale 2 puffs into the lungs every 4 (four) hours as needed.      . ALBUTEROL SULFATE PO 2.5mg /64mL as needed.      Marland Kitchen antiseptic oral rinse (BIOTENE) LIQD 15 mLs by Mouth  Rinse route 2 (two) times daily as needed.      Marland Kitchen aspirin 81 MG tablet Take 81 mg by mouth daily.      . beclomethasone (QVAR) 80 MCG/ACT inhaler Inhale 2 puffs into the lungs as needed.      . cetirizine (ZYRTEC) 10 MG chewable tablet Chew 10 mg by mouth daily.      . chlorthalidone (HYGROTON) 25 MG tablet Take 25 mg by mouth daily.      . citalopram (CELEXA) 20 MG tablet Take 20 mg by mouth daily.      . clobetasol cream (TEMOVATE) 0.05 % Apply topically 2 (two) times daily.      . cyclobenzaprine (FLEXERIL) 10 MG tablet Take 10 mg by mouth as needed.      . dicyclomine (BENTYL) 20 MG tablet Take 20 mg by mouth every 6 (six) hours.      . DULoxetine (CYMBALTA) 20 MG capsule Take 20 mg by mouth daily.      . finasteride (PROSCAR) 5 MG tablet Take 5 mg by mouth daily.      . fluticasone (FLONASE) 50 MCG/ACT nasal spray Place 2 sprays into the nose daily.      Marland Kitchen glucose blood test strip 1 each by Other route as needed. Use as instructed      . hydrALAZINE (APRESOLINE) 100 MG tablet Take 1 tablet (100 mg total) by mouth 3 (three) times daily.  90 tablet  6  . HYDROcodone-acetaminophen (NORCO/VICODIN) 5-325 MG per  tablet Take 1 tablet by mouth as needed for pain.      Marland Kitchen insulin aspart (NOVOLOG FLEXPEN) 100 UNIT/ML injection 60 units before breakfast, 60 units before lunch, 60 units before dinner and 10 units with snacks.      . insulin glargine (LANTUS) 100 UNIT/ML injection Inject 80 Units into the skin at bedtime.       . Insulin Pen Needle (NOVOFINE) 30G X 8 MM MISC Inject 1 packet into the skin 4 (four) times daily.      . isosorbide dinitrate (ISORDIL) 30 MG tablet Take 30 mg by mouth 2 (two) times daily.      . isosorbide mononitrate (IMDUR) 30 MG 24 hr tablet take 1 tablet by mouth twice a day  60 tablet  6  . lisinopril (PRINIVIL,ZESTRIL) 40 MG tablet Take 40 mg by mouth daily.      . methylPREDNISolone (MEDROL) 4 MG tablet Take 4 mg by mouth as directed.      . metoprolol (LOPRESSOR) 100  MG tablet Take 100 mg by mouth 2 (two) times daily.      . Multiple Vitamin (MULTIVITAMIN) tablet Take 1 tablet by mouth daily.      . naproxen (NAPROSYN) 250 MG tablet Take 250 mg by mouth daily.      . nicotine (NICODERM CQ - DOSED IN MG/24 HR) 7 mg/24hr patch Place 1 patch onto the skin daily.      . nicotine polacrilex (COMMIT) 2 MG lozenge Place 2 mg inside cheek as needed for smoking cessation.      . nitroGLYCERIN (NITROSTAT) 0.4 MG SL tablet Place 1 tablet (0.4 mg total) under the tongue every 5 (five) minutes as needed for chest pain.  25 tablet  3  . omeprazole (PRILOSEC) 20 MG capsule Take 20 mg by mouth daily.      . polyethylene glycol (MIRALAX / GLYCOLAX) packet Take 17 g by mouth daily.      . ranolazine (RANEXA) 1000 MG SR tablet Take 1 tablet (1,000 mg total) by mouth 2 (two) times daily.  60 tablet  3  . spironolactone (ALDACTONE) 25 MG tablet Take 25 mg by mouth daily.      . tamsulosin (FLOMAX) 0.4 MG CAPS Take 0.4 mg by mouth daily.      . traZODone (DESYREL) 50 MG tablet Takes 1/2- 2 tablets at bedtime.      . triamcinolone ointment (KENALOG) 0.5 % Apply topically 2 (two) times daily.      . pravastatin (PRAVACHOL) 80 MG tablet Take 1 tablet (80 mg total) by mouth every evening. Was holding   30 tablet  6    Review of Systems  Constitutional: Negative.   HENT: Negative.   Eyes: Negative.   Cardiovascular: Negative.   Gastrointestinal: Negative.   Musculoskeletal: Positive for gait problem.       Severe leg pain, chronic  Skin: Negative.   Neurological: Negative.   Psychiatric/Behavioral: Negative.   All other systems reviewed and are negative.    BP 140/86  Pulse 67  Ht 5\' 5"  (1.651 m)  Wt 206 lb 4 oz (93.554 kg)  BMI 34.32 kg/m2  Physical Exam  Nursing note and vitals reviewed. Constitutional: He is oriented to person, place, and time. He appears well-developed and well-nourished.  Presents with arm crutches bilaterally  HENT:  Head: Normocephalic.   Nose: Nose normal.  Mouth/Throat: Oropharynx is clear and moist.  Eyes: Conjunctivae are normal. Pupils are equal, round, and reactive to light.  Neck: Normal range of motion. Neck supple. No JVD present.  Cardiovascular: Normal rate, regular rhythm, S1 normal, S2 normal, normal heart sounds and intact distal pulses.  Exam reveals no gallop and no friction rub.   No murmur heard. Pulmonary/Chest: Effort normal and breath sounds normal. No respiratory distress. He has no wheezes. He has no rales. He exhibits no tenderness.  Abdominal: Soft. Bowel sounds are normal. He exhibits no distension. There is no tenderness.  Musculoskeletal: Normal range of motion. He exhibits no edema and no tenderness.  In the legs, in braces bilaterally  Lymphadenopathy:    He has no cervical adenopathy.  Neurological: He is alert and oriented to person, place, and time. Coordination abnormal.  Skin: Skin is warm and dry. No rash noted. No erythema.  Psychiatric: He has a normal mood and affect. His behavior is normal. Judgment and thought content normal.      Assessment and Plan

## 2012-06-07 NOTE — Patient Instructions (Addendum)
You are doing well. Please restart pravastatin 1/2 pill for a few weeks Increase back to a full pull in June if no significant muscle ache  Please call us if you have new issues that need to be addressed before your next appt.  Your physician wants you to follow-up in: 12 months.  You will receive a reminder letter in the mail two months in advance. If you don't receive a letter, please call our office to schedule the follow-up appointment.

## 2012-06-07 NOTE — Assessment & Plan Note (Signed)
Blood pressure is well controlled on today's visit. No changes made to the medications. 

## 2012-06-09 ENCOUNTER — Other Ambulatory Visit: Payer: Self-pay | Admitting: Cardiovascular Disease

## 2012-06-10 ENCOUNTER — Other Ambulatory Visit: Payer: Self-pay | Admitting: *Deleted

## 2012-06-10 MED ORDER — HYDRALAZINE HCL 100 MG PO TABS: ORAL_TABLET | ORAL | Status: DC

## 2012-06-10 NOTE — Telephone Encounter (Signed)
Refilled Hydralazine sent to [facility].

## 2012-08-12 DIAGNOSIS — G894 Chronic pain syndrome: Secondary | ICD-10-CM | POA: Diagnosis present

## 2012-08-15 ENCOUNTER — Other Ambulatory Visit: Payer: Self-pay | Admitting: Cardiovascular Disease

## 2012-08-15 NOTE — Telephone Encounter (Signed)
Refilled Ranexa sent to [facility].

## 2012-09-10 ENCOUNTER — Other Ambulatory Visit: Payer: Self-pay | Admitting: Cardiovascular Disease

## 2012-10-02 ENCOUNTER — Other Ambulatory Visit: Payer: Self-pay | Admitting: Cardiovascular Disease

## 2012-12-15 ENCOUNTER — Emergency Department: Payer: Self-pay | Admitting: Internal Medicine

## 2012-12-19 ENCOUNTER — Other Ambulatory Visit: Payer: Self-pay | Admitting: Cardiovascular Disease

## 2013-01-04 ENCOUNTER — Other Ambulatory Visit: Payer: Self-pay | Admitting: Cardiovascular Disease

## 2013-01-21 ENCOUNTER — Ambulatory Visit (INDEPENDENT_AMBULATORY_CARE_PROVIDER_SITE_OTHER): Payer: Medicaid Other | Admitting: Cardiovascular Disease

## 2013-01-21 ENCOUNTER — Encounter: Payer: Self-pay | Admitting: Cardiovascular Disease

## 2013-01-21 VITALS — BP 118/90 | HR 103 | Ht 65.0 in | Wt 200.0 lb

## 2013-01-21 DIAGNOSIS — E119 Type 2 diabetes mellitus without complications: Secondary | ICD-10-CM

## 2013-01-21 DIAGNOSIS — R0602 Shortness of breath: Secondary | ICD-10-CM

## 2013-01-21 DIAGNOSIS — R000 Tachycardia, unspecified: Secondary | ICD-10-CM

## 2013-01-21 DIAGNOSIS — R079 Chest pain, unspecified: Secondary | ICD-10-CM

## 2013-01-21 DIAGNOSIS — I10 Essential (primary) hypertension: Secondary | ICD-10-CM

## 2013-01-21 NOTE — Assessment & Plan Note (Signed)
We'll continue his current medications. If he does start using more metoprolol and regular basis for tachycardia, may need to decrease his chlorthalidone does or decrease the hydralazine dose. This was discussed with him

## 2013-01-21 NOTE — Progress Notes (Signed)
Patient ID: Cory Campbell, male    DOB: 1980-10-07, 32 y.o.   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 He presents for routine followup.  On his last clinic visit he reported having chest pain. He was started on ranexa and reports mild improvement in his symptoms.. biggest complaint on today's visit is tachycardia. He stopped smoking 2 weeks ago, now using E. Cigarette. Otherwise feels well with no complaints. Diabetes has been poorly controlled recently, sugars greater than 200 on a regular basis  Previous fall, hurt his left side including his arm and hip/leg. Leg brace was needing adjustment. Denies any leg edema, shortness of breath.     Prior visits to the emergency room  at [facility] for abdominal pain. He was started on MiraLAX for constipation.  emergency room for chest pain on 02/09/2012. Workup was essentially negative. Chest pain radiates to the right, the left, up to the shoulder, sometimes downward. Symptoms present at rest and with exertion.  Blood pressure has been relatively stable.   EKG shows normal sinus rhythm with rate 103 beats per minute, nonspecific ST and T wave abnormality   EKG on 02/09/2012 in the hospital showed normal sinus rhythm with rate 86 beats per minute with PVCs otherwise no significant ST or T wave changes   Outpatient Encounter Prescriptions as of 01/21/2013  Medication Sig  . albuterol (PROVENTIL HFA;VENTOLIN HFA) 108 (90 BASE) MCG/ACT inhaler Inhale 2 puffs into the lungs every 4 (four) hours as needed.  . ALBUTEROL SULFATE PO 2.5mg /4mL as needed.  Marland Kitchen antiseptic oral rinse (BIOTENE) LIQD 15 mLs by Mouth Rinse route 2 (two) times daily as  needed.  Marland Kitchen aspirin 81 MG tablet Take 81 mg by mouth daily.  . beclomethasone (QVAR) 80 MCG/ACT inhaler Inhale 2 puffs into the lungs as needed.  . cetirizine (ZYRTEC) 10 MG chewable tablet Chew 10 mg by mouth daily.  . chlorthalidone (HYGROTON) 25 MG tablet Take 25 mg by mouth daily.  . citalopram (CELEXA) 20 MG tablet Take 20 mg by mouth daily.  . clobetasol cream (TEMOVATE) 0.05 % Apply topically 2 (two) times daily.  . cyclobenzaprine (FLEXERIL) 10 MG tablet Take 10 mg by mouth 3 (three) times daily as needed.   . dicyclomine (BENTYL) 20 MG tablet Take 20 mg by mouth every 6 (six) hours.  . DULoxetine (CYMBALTA) 20 MG capsule Take 20 mg by mouth daily.  . finasteride (PROSCAR) 5 MG tablet Take 5 mg by mouth daily.  . fluticasone (FLONASE) 50 MCG/ACT nasal spray Place 2 sprays into the nose daily.  Marland Kitchen glucose blood test strip 1 each by Other route as needed. Use as instructed  . hydrALAZINE (APRESOLINE) 100 MG tablet take 1 tablet by mouth three times a day  . insulin aspart (NOVOLOG FLEXPEN) 100 UNIT/ML injection 60 units before breakfast, 60 units before lunch, 60 units before dinner and 10 units with snacks.  . insulin glargine (LANTUS) 100 UNIT/ML injection Inject 80 Units into the skin at bedtime.   . Insulin Pen Needle (NOVOFINE) 30G X 8 MM MISC Inject 1 packet into the skin 4 (four) times daily.  . isosorbide dinitrate (ISORDIL) 30 MG tablet Take 30 mg by mouth 2 (two) times  daily.  . lisinopril (PRINIVIL,ZESTRIL) 40 MG tablet Take 40 mg by mouth daily.  . metoprolol (LOPRESSOR) 100 MG tablet Take 100 mg by mouth 2 (two) times daily.  . Multiple Vitamin (MULTIVITAMIN) tablet Take 1 tablet by mouth daily.  Marland Kitchen NITROSTAT 0.4 MG SL tablet place 1 tablet under the tongue if needed every 5 minutes for chest pain for 3 doses IF NO RELIEF AFTER 3RD DOSE CALL PRESCRIBER OR 911.  . omeprazole (PRILOSEC) 20 MG capsule Take 20 mg by mouth daily.  . polyethylene glycol (MIRALAX / GLYCOLAX) packet  Take 17 g by mouth daily.  . pravastatin (PRAVACHOL) 80 MG tablet take 1 tablet by mouth every evening  . RANEXA 1000 MG SR tablet take 1 tablet by mouth twice a day  . spironolactone (ALDACTONE) 25 MG tablet Take 25 mg by mouth daily.  . tamsulosin (FLOMAX) 0.4 MG CAPS Take 0.4 mg by mouth daily.  . traZODone (DESYREL) 50 MG tablet Takes 1/2- 2 tablets at bedtime.  . triamcinolone ointment (KENALOG) 0.5 % Apply topically 2 (two) times daily.    Review of Systems  Constitutional: Negative.   HENT: Negative.   Eyes: Negative.   Respiratory: Negative.   Cardiovascular: Positive for chest pain and palpitations.  Gastrointestinal: Negative.   Endocrine: Negative.   Musculoskeletal: Positive for gait problem.       Severe leg pain, chronic  Skin: Negative.   Allergic/Immunologic: Negative.   Neurological: Negative.   Hematological: Negative.   Psychiatric/Behavioral: Negative.   All other systems reviewed and are negative.    BP 118/90  Pulse 103  Ht 5\' 5"  (1.651 m)  Wt 200 lb (90.719 kg)  BMI 33.28 kg/m2  Physical Exam  Nursing note and vitals reviewed. Constitutional: He is oriented to person, place, and time. He appears well-developed and well-nourished.  Presents with arm crutches bilaterally  HENT:  Head: Normocephalic.  Nose: Nose normal.  Mouth/Throat: Oropharynx is clear and moist.  Eyes: Conjunctivae are normal. Pupils are equal, round, and reactive to light.  Neck: Normal range of motion. Neck supple. No JVD present.  Cardiovascular: Normal rate, regular rhythm, S1 normal, S2 normal, normal heart sounds and intact distal pulses.  Exam reveals no gallop and no friction rub.   No murmur heard. Pulmonary/Chest: Effort normal and breath sounds normal. No respiratory distress. He has no wheezes. He has no rales. He exhibits no tenderness.  Abdominal: Soft. Bowel sounds are normal. He exhibits no distension. There is no tenderness.  Musculoskeletal: Normal range of  motion. He exhibits no edema and no tenderness.  In the legs, in braces bilaterally  Lymphadenopathy:    He has no cervical adenopathy.  Neurological: He is alert and oriented to person, place, and time. Coordination abnormal.  Skin: Skin is warm and dry. No rash noted. No erythema.  Psychiatric: He has a normal mood and affect. His behavior is normal. Judgment and thought content normal.      Assessment and Plan

## 2013-01-21 NOTE — Assessment & Plan Note (Signed)
Currently wearing his CPAP 

## 2013-01-21 NOTE — Assessment & Plan Note (Signed)
Elevated heart rate on today's visit, etiology is not clear. He's using electronic cigarettes, recently quit smoking. Some stress at home. Denies missing doses of his metoprolol. We have suggested he take extra half the Toprol as needed for palpitations. If heart rate continues to run high, he could increase a.m. metoprolol up to 150 mg in the morning, continue 100 mg in the evening. He may need to cut his chlorthalidone down or in half if blood pressure is low. Could also decrease the dose of his hydralazine in half for hypotension

## 2013-01-21 NOTE — Patient Instructions (Signed)
Heart rate is mildly elevated  OK to take extra 1/2 metoprolol as needed for tachycardia or palpitations Or Please increase the metoprolol to 150 mg in the morning, stay on 100 mg at dinner Cut the chlorthalidone in 1/2 or hold it if blood pressure runs low  Or hold the noon hydralazine   Please call us if you have new issues that need to be addressed before your next appt.  Your physician wants you to follow-up in: 6 months.  You will receive a reminder letter in the mail two months in advance. If you don't receive a letter, please call our office to schedule the follow-up appointment.

## 2013-01-21 NOTE — Assessment & Plan Note (Signed)
Sugars have been running high, typically greater than 200 by his report. Encouraged him to watch his diet

## 2013-01-21 NOTE — Assessment & Plan Note (Signed)
Chronic atypical chest pain. Previous workup negative, history of falls, also uses his upper extremities to lift his weight

## 2013-04-24 ENCOUNTER — Other Ambulatory Visit: Payer: Self-pay | Admitting: Cardiovascular Disease

## 2013-05-15 ENCOUNTER — Other Ambulatory Visit: Payer: Self-pay | Admitting: Cardiovascular Disease

## 2013-06-16 ENCOUNTER — Other Ambulatory Visit: Payer: Self-pay | Admitting: Cardiovascular Disease

## 2013-07-21 ENCOUNTER — Encounter: Payer: Self-pay | Admitting: *Deleted

## 2013-07-21 ENCOUNTER — Ambulatory Visit: Payer: Medicaid Other | Admitting: Cardiovascular Disease

## 2013-08-12 ENCOUNTER — Emergency Department: Payer: Self-pay | Admitting: Emergency Medicine

## 2013-08-12 ENCOUNTER — Ambulatory Visit (INDEPENDENT_AMBULATORY_CARE_PROVIDER_SITE_OTHER): Payer: Medicaid Other | Admitting: Cardiovascular Disease

## 2013-08-12 ENCOUNTER — Encounter: Payer: Self-pay | Admitting: Cardiovascular Disease

## 2013-08-12 VITALS — BP 172/120 | HR 131 | Ht 65.0 in | Wt 208.0 lb

## 2013-08-12 DIAGNOSIS — M898X1 Other specified disorders of bone, shoulder: Secondary | ICD-10-CM | POA: Insufficient documentation

## 2013-08-12 DIAGNOSIS — R000 Tachycardia, unspecified: Secondary | ICD-10-CM

## 2013-08-12 DIAGNOSIS — M899 Disorder of bone, unspecified: Secondary | ICD-10-CM

## 2013-08-12 DIAGNOSIS — M949 Disorder of cartilage, unspecified: Secondary | ICD-10-CM

## 2013-08-12 DIAGNOSIS — Z9989 Dependence on other enabling machines and devices: Secondary | ICD-10-CM

## 2013-08-12 DIAGNOSIS — Z9181 History of falling: Secondary | ICD-10-CM

## 2013-08-12 DIAGNOSIS — R0602 Shortness of breath: Secondary | ICD-10-CM

## 2013-08-12 DIAGNOSIS — R079 Chest pain, unspecified: Secondary | ICD-10-CM

## 2013-08-12 DIAGNOSIS — R296 Repeated falls: Secondary | ICD-10-CM | POA: Insufficient documentation

## 2013-08-12 DIAGNOSIS — E118 Type 2 diabetes mellitus with unspecified complications: Secondary | ICD-10-CM

## 2013-08-12 DIAGNOSIS — I10 Essential (primary) hypertension: Secondary | ICD-10-CM

## 2013-08-12 DIAGNOSIS — G4733 Obstructive sleep apnea (adult) (pediatric): Secondary | ICD-10-CM

## 2013-08-12 MED ORDER — FUROSEMIDE 20 MG PO TABS: 20.0000 mg | ORAL_TABLET | Freq: Every day | ORAL | Status: DC | PRN

## 2013-08-12 MED ORDER — POTASSIUM CHLORIDE CRYS ER 20 MEQ PO TBCR: 20.0000 meq | EXTENDED_RELEASE_TABLET | Freq: Every day | ORAL | Status: DC | PRN

## 2013-08-12 MED ORDER — METOPROLOL TARTRATE 100 MG PO TABS: 150.0000 mg | ORAL_TABLET | Freq: Two times a day (BID) | ORAL | Status: DC

## 2013-08-12 NOTE — Assessment & Plan Note (Signed)
We have encouraged continued exercise, careful diet management in an effort to lose weight. 

## 2013-08-12 NOTE — Assessment & Plan Note (Signed)
Recent fall yesterday now with swelling of his left clavicle region. Blood pressure high and heart rate high likely from severe pain. He is going to the emergency room for x-rays

## 2013-08-12 NOTE — Assessment & Plan Note (Signed)
Recommended increased metoprolol up to 150 mm twice a day. Faster heart rate and hypertension today likely from severe pain

## 2013-08-12 NOTE — Assessment & Plan Note (Signed)
We'll increase his metoprolol to 150 mg twice a day. Blood pressure well above his baseline likely from severe pain

## 2013-08-12 NOTE — Patient Instructions (Addendum)
Please increase the metoprolol up to 150 mg twice a day for fast heart rate  Please take lasix/furosemide as needed for shortness of breath Take with potassium  Please call us if you have new issues that need to be addressed before your next appt.  Your physician wants you to follow-up in: 1 month.

## 2013-08-12 NOTE — Assessment & Plan Note (Signed)
Swelling noted of the left clavicular region. Suggested he go for x-rays to rule out fracture

## 2013-08-12 NOTE — Progress Notes (Signed)
Patient ID: Cory Campbell, male    DOB: [birth year], 33 y.o.   MRN: [id number]  HPI Comments: 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 He presents for routine followup. History of frequent falls  In followup today, he reports that he fell yesterday. He landed on his left shoulder. He has had severe pain in his upper left mediastinal area, around his collarbone. Significant swelling noted. He has not been icing. He reports pain is 10 over 10. He is on a pain patch, also a pain cream which does not seem to be helping.  He had a fall about one month ago also [facility]. No fracture reported per the patient  Reports having shortness of breath for the past several weeks when he gets up in the morning. Possibly has trace leg edema but not particularly impressive. No coughing or PND.  No recent chest pain reported. Continues to have periods of palpitations consistent with tachycardia. Of proptosis visit we suggested he increase his metoprolol up to 150 mg in the morning. Continues to take 100 mg twice a day Reports his sugars have been running high   [facility]. He was started on MiraLAX for constipation.  emergency room for chest pain on 02/09/2012. Workup was essentially negative. Chest pain radiates to the right, the left, up to the shoulder, sometimes downward. Symptoms present at rest and with exertion.  Blood pressure has been relatively stable.   EKG shows normal sinus rhythm with rate 129 beats per minute, nonspecific ST and T wave abnormality    Outpatient Encounter Prescriptions as of 08/12/2013  Medication Sig  . albuterol  (PROVENTIL HFA;VENTOLIN HFA) 108 (90 BASE) MCG/ACT inhaler Inhale 2 puffs into the lungs every 4 (four) hours as needed.  . ALBUTEROL SULFATE PO 2.5mg /643mL as needed.  Marland Kitchen. antiseptic oral rinse (BIOTENE) LIQD 15 mLs by Mouth Rinse route 2 (two) times daily as needed.  Marland Kitchen. aspirin 81 MG tablet Take 81 mg by mouth daily.  . beclomethasone (QVAR) 80 MCG/ACT inhaler Inhale 2 puffs into the lungs as needed.  . cetirizine (ZYRTEC) 10 MG chewable tablet Chew 10 mg by mouth daily.  . chlorthalidone (HYGROTON) 25 MG tablet Take 25 mg by mouth daily.  . citalopram (CELEXA) 20 MG tablet Take 20 mg by mouth daily.  . clobetasol cream (TEMOVATE) 0.05 % Apply topically 2 (two) times daily.  . cyclobenzaprine (FLEXERIL) 10 MG tablet Take 10 mg by mouth 3 (three) times daily as needed.   . dicyclomine (BENTYL) 20 MG tablet Take 20 mg by mouth every 6 (six) hours.  . DULoxetine (CYMBALTA) 20 MG capsule Take 20 mg by mouth daily.  . finasteride (PROSCAR) 5 MG tablet Take 5 mg by mouth daily.  . fluticasone (FLONASE) 50 MCG/ACT nasal spray Place 2 sprays into the nose daily.  Marland Kitchen. glucose blood test strip 1 each by Other route as needed. Use as instructed  . hydrALAZINE (APRESOLINE) 100 MG tablet take 1 tablet by mouth three times a day  . hydrALAZINE (APRESOLINE) 100 MG tablet take 1 tablet by mouth three times a day  . insulin aspart (NOVOLOG FLEXPEN) 100 UNIT/ML injection Inject 40  Units into the skin 3 (three) times daily with meals.   . insulin glargine (LANTUS) 100 UNIT/ML injection Inject 60 Units into the skin 2 (two) times daily.   . Insulin Pen Needle (NOVOFINE) 30G X 8 MM MISC Inject 1 packet into the skin 4 (four) times daily.  . isosorbide dinitrate (ISORDIL) 30 MG tablet Take 30 mg by mouth 2 (two) times daily.  . isosorbide mononitrate (IMDUR) 30 MG 24 hr tablet take 1 tablet by mouth twice a day  . lisinopril (PRINIVIL,ZESTRIL) 40 MG tablet Take 40 mg by mouth daily.  . metoprolol (LOPRESSOR) 100 MG  tablet Take 100 mg by mouth 2 (two) times daily.  . Multiple Vitamin (MULTIVITAMIN) tablet Take 1 tablet by mouth daily.  Marland Kitchen. NITROSTAT 0.4 MG SL tablet place 1 tablet under the tongue if needed every 5 minutes for chest pain for 3 doses IF NO RELIEF AFTER 3RD DOSE CALL PRESCRIBER OR 911.  . omeprazole (PRILOSEC) 20 MG capsule Take 20 mg by mouth daily.  . polyethylene glycol (MIRALAX / GLYCOLAX) packet Take 17 g by mouth daily.  . pravastatin (PRAVACHOL) 80 MG tablet take 1 tablet by mouth every evening  . RANEXA 1000 MG SR tablet take 1 tablet by mouth twice a day  . spironolactone (ALDACTONE) 25 MG tablet Take 25 mg by mouth daily.  . tamsulosin (FLOMAX) 0.4 MG CAPS Take 0.4 mg by mouth daily.  . traZODone (DESYREL) 50 MG tablet Takes 1/2- 2 tablets at bedtime.  . triamcinolone ointment (KENALOG) 0.5 % Apply topically 2 (two) times daily.    Review of Systems  Constitutional: Negative.   HENT: Negative.   Eyes: Negative.   Respiratory: Negative.   Cardiovascular: Positive for chest pain and palpitations.  Gastrointestinal: Negative.   Endocrine: Negative.   Musculoskeletal: Positive for gait problem.       Severe leg pain, chronic Pain of his left clavicle area, behind his neck  Skin: Negative.   Allergic/Immunologic: Negative.   Neurological: Negative.   Hematological: Negative.   Psychiatric/Behavioral: Negative.   All other systems reviewed and are negative.   BP 172/120  Pulse 131  Ht 5\' 5"  (1.651 m)  Wt 208 lb (94.348 kg)  BMI 34.61 kg/m2  Physical Exam  Nursing note and vitals reviewed. Constitutional: He is oriented to person, place, and time. He appears well-developed and well-nourished.  Presents with arm crutches bilaterally Swelling noted of the left clavicle region, severe tenderness with palpation  HENT:  Head: Normocephalic.  Nose: Nose normal.  Mouth/Throat: Oropharynx is clear and moist.  Eyes: Conjunctivae are normal. Pupils are equal, round, and  reactive to light.  Neck: Normal range of motion. Neck supple. No JVD present.  Cardiovascular: Normal rate, regular rhythm, S1 normal, S2 normal, normal heart sounds and intact distal pulses.  Exam reveals no gallop and no friction rub.   No murmur heard. Pulmonary/Chest: Effort normal and breath sounds normal. No respiratory distress. He has no wheezes. He has no rales. He exhibits no tenderness.  Abdominal: Soft. Bowel sounds are normal. He exhibits no distension. There is no tenderness.  Musculoskeletal: Normal range of motion. He exhibits no edema and no tenderness.  In the legs, in braces bilaterally  Lymphadenopathy:    He has no cervical adenopathy.  Neurological: He is alert and oriented to person, place, and time. Coordination abnormal.  Skin: Skin is warm and dry. No rash noted. No erythema.  Psychiatric: He has a normal mood and affect. His behavior  is normal. Judgment and thought content normal.      Assessment and Plan

## 2013-08-12 NOTE — Assessment & Plan Note (Signed)
Recommended he continue with his CPAP

## 2013-08-27 ENCOUNTER — Telehealth: Payer: Self-pay

## 2013-08-27 ENCOUNTER — Emergency Department: Payer: Self-pay | Admitting: Emergency Medicine

## 2013-08-27 NOTE — Telephone Encounter (Signed)
I see no indication that pt went to [facility].   Left message for pt to call back.

## 2013-08-27 NOTE — Telephone Encounter (Signed)
Pt states his heart has "not stop beating fast", states he has a sharp pain when his heart beats fast. Please call.

## 2013-08-27 NOTE — Telephone Encounter (Signed)
Patient stated he is sweating and his chest hurts  He "feels like his heart is beating very fast" He has not been active today, just resting  Pain is a 9  He states his pain is sharp and stabbing  It does not decrease with rest  Patient stated that he took his metoprolol this morning   Patient advised to go to the ED  He stated someone is there to drive him now Discussed this with Dr. Mariah MillingGollan   Requested that patient call us after his ED visit

## 2013-08-27 NOTE — Telephone Encounter (Signed)
Patient called again

## 2013-08-28 LAB — CBC
HCT: 47.3 % (ref 40.0–52.0)
HGB: 15.8 g/dL (ref 13.0–18.0)
MCH: 28.4 pg (ref 26.0–34.0)
MCHC: 33.4 g/dL (ref 32.0–36.0)
MCV: 85 fL (ref 80–100)
Platelet: 271 10*3/uL (ref 150–440)
RBC: 5.57 10*6/uL (ref 4.40–5.90)
RDW: 13.1 % (ref 11.5–14.5)
WBC: 6 10*3/uL (ref 3.8–10.6)

## 2013-08-28 LAB — PROTIME-INR
INR: 0.9
Prothrombin Time: 12.2 secs (ref 11.5–14.7)

## 2013-08-28 LAB — BASIC METABOLIC PANEL
ANION GAP: 10 (ref 7–16)
BUN: 8 mg/dL (ref 7–18)
CREATININE: 0.7 mg/dL (ref 0.60–1.30)
Calcium, Total: 7.8 mg/dL — ABNORMAL LOW (ref 8.5–10.1)
Chloride: 96 mmol/L — ABNORMAL LOW (ref 98–107)
Co2: 28 mmol/L (ref 21–32)
EGFR (African American): >60
EGFR (Non-African Amer.): >60
GLUCOSE: 331 mg/dL — AB (ref 65–99)
Osmolality: 279 (ref 275–301)
Potassium: 2.9 mmol/L — ABNORMAL LOW (ref 3.5–5.1)
SODIUM: 134 mmol/L — AB (ref 136–145)

## 2013-08-28 LAB — CK TOTAL AND CKMB (NOT AT ARMC)
CK, Total: 418 U/L — ABNORMAL HIGH
CK-MB: 1.2 ng/mL (ref 0.5–3.6)

## 2013-08-28 LAB — PRO B NATRIURETIC PEPTIDE

## 2013-08-28 LAB — APTT: Activated PTT: 29.9 secs (ref 23.6–35.9)

## 2013-08-28 LAB — TROPONIN I
Troponin-I: <0.02 ng/mL
Troponin-I: <0.02 ng/mL

## 2013-08-28 LAB — D-DIMER(ARMC): D-Dimer: 262 ng/ml

## 2013-09-22 ENCOUNTER — Ambulatory Visit: Payer: Medicaid Other | Admitting: Cardiovascular Disease

## 2013-10-22 ENCOUNTER — Ambulatory Visit: Payer: Medicaid Other | Admitting: Cardiovascular Disease

## 2013-10-29 ENCOUNTER — Ambulatory Visit: Payer: Medicaid Other | Admitting: Cardiovascular Disease

## 2013-11-03 ENCOUNTER — Emergency Department: Payer: Self-pay | Admitting: Emergency Medicine

## 2013-11-03 LAB — URINALYSIS, COMPLETE
BILIRUBIN, UR: NEGATIVE
Bacteria: NONE SEEN
Blood: NEGATIVE
GLUCOSE, UR: NEGATIVE mg/dL (ref 0–75)
Ketone: NEGATIVE
LEUKOCYTE ESTERASE: NEGATIVE
Nitrite: NEGATIVE
PROTEIN: NEGATIVE
Ph: 7 (ref 4.5–8.0)
RBC,UR: <1 /HPF (ref 0–5)
Specific Gravity: 1.01 (ref 1.003–1.030)
Squamous Epithelial: 1
WBC UR: 1 /HPF (ref 0–5)

## 2013-11-10 ENCOUNTER — Other Ambulatory Visit: Payer: Self-pay | Admitting: Cardiovascular Disease

## 2013-11-12 ENCOUNTER — Encounter: Payer: Self-pay | Admitting: Cardiovascular Disease

## 2013-11-12 ENCOUNTER — Ambulatory Visit (INDEPENDENT_AMBULATORY_CARE_PROVIDER_SITE_OTHER): Payer: Medicaid Other | Admitting: Cardiovascular Disease

## 2013-11-12 VITALS — BP 138/92 | HR 85 | Ht 63.0 in | Wt 219.8 lb

## 2013-11-12 DIAGNOSIS — R296 Repeated falls: Secondary | ICD-10-CM

## 2013-11-12 DIAGNOSIS — I10 Essential (primary) hypertension: Secondary | ICD-10-CM

## 2013-11-12 DIAGNOSIS — G4733 Obstructive sleep apnea (adult) (pediatric): Secondary | ICD-10-CM

## 2013-11-12 DIAGNOSIS — R079 Chest pain, unspecified: Secondary | ICD-10-CM

## 2013-11-12 DIAGNOSIS — R000 Tachycardia, unspecified: Secondary | ICD-10-CM

## 2013-11-12 DIAGNOSIS — E118 Type 2 diabetes mellitus with unspecified complications: Secondary | ICD-10-CM

## 2013-11-12 DIAGNOSIS — Z9989 Dependence on other enabling machines and devices: Secondary | ICD-10-CM

## 2013-11-12 NOTE — Assessment & Plan Note (Signed)
Compliant with his CPAP, stable

## 2013-11-12 NOTE — Assessment & Plan Note (Signed)
Recommended that he may need an endocrinologist for diabetes control. He reports sugars running in the 200s on a regular basis

## 2013-11-12 NOTE — Progress Notes (Signed)
Patient ID: Cory Campbell, male    DOB: March 20, 1980, 33 y.o.   MRN: [id number]  HPI Comments: Cory Campbell is a 33 year-old gentleman with a history of Arthrogryposis multiplex congenita, chronic pain in his legs, asthma, hypertension, diabetes, severe obstructive sleep apnea on CPAP since April 2012, obesity, seizure disorder, psoriasis with several  evaluations in the emergency room for chest pain on April 23, 06/01/2011. He presents for routine followup. History of frequent falls  In followup today, he reports that he is doing well. Sugars have been running high. Report sugars typically more than 200 on a regular basis. Diabetes is managed by primary care. No recent falls or injuries Denies having any significant shortness of breath. No significant lower extremity edema No coughing or PND. He is taking metoprolol 150 mg twice a day  [facility]. He was started on MiraLAX for constipation. emergency room for chest pain on 02/09/2012. Workup was essentially negative. Chest pain radiates to the right, the left, up to the shoulder, sometimes downward. Symptoms present at rest and with exertion.  Blood pressure has been relatively stable.   EKG shows normal sinus rhythm with rate 84 beats per minute, nonspecific ST and T wave abnormality    Outpatient Encounter Prescriptions as of 11/12/2013  Medication Sig  . albuterol (PROVENTIL HFA;VENTOLIN HFA) 108 (90 BASE) MCG/ACT inhaler Inhale 2 puffs into the lungs every 4 (four) hours as needed.  . ALBUTEROL SULFATE PO 2.5mg /653mL as needed.  Marland Kitchen. aspirin 81 MG tablet Take 81 mg by mouth daily.  . baclofen (LIORESAL) 10 MG tablet Take 10 mg by mouth 3 (three) times daily.  . beclomethasone (QVAR) 80 MCG/ACT inhaler Inhale 2 puffs into the lungs as needed.  . cetirizine (ZYRTEC) 10 MG chewable tablet Chew 10 mg by mouth daily.  . chlorthalidone (HYGROTON) 25 MG tablet Take 25 mg by mouth daily.  .  citalopram (CELEXA) 20 MG tablet Take 20 mg by mouth daily.  Marland Kitchen. dicyclomine (BENTYL) 20 MG tablet Take 20 mg by mouth every 6 (six) hours.  . finasteride (PROSCAR) 5 MG tablet Take 5 mg by mouth daily.  . fluticasone (FLONASE) 50 MCG/ACT nasal spray Place 2 sprays into the nose daily.  . furosemide (LASIX) 20 MG tablet Take 1 tablet (20 mg total) by mouth daily as needed.  Marland Kitchen. glucose blood test strip 1 each by Other route as needed. Use as instructed  . hydrALAZINE (APRESOLINE) 100 MG tablet take 1 tablet by mouth three times a day  . insulin aspart (NOVOLOG FLEXPEN) 100 UNIT/ML injection Inject 40 Units into the skin 3 (three) times daily with meals.   . insulin glargine (LANTUS) 100 UNIT/ML injection Inject 60 Units into the skin 2 (two) times daily.   . Insulin Pen Needle (NOVOFINE) 30G X 8 MM MISC Inject 1 packet into the skin 4 (four) times daily.  . isosorbide dinitrate (ISORDIL) 30 MG tablet Take 30 mg by mouth 2 (two) times daily.  Marland Kitchen. lisinopril (PRINIVIL,ZESTRIL) 40 MG tablet Take 40 mg by mouth daily.  . metoprolol (LOPRESSOR) 100 MG tablet Take 1.5 tablets (150 mg total) by mouth 2 (two) times daily.  . Multiple Vitamin (MULTIVITAMIN) tablet Take 1 tablet by mouth daily.  Marland Kitchen. NITROSTAT 0.4 MG SL tablet place 1 tablet under the tongue if needed every 5 minutes for chest pain for 3 doses IF NO RELIEF AFTER 3RD DOSE CALL PRESCRIBER OR 911.  .Marland Kitchen  omeprazole (PRILOSEC) 20 MG capsule Take 20 mg by mouth daily.  . polyethylene glycol (MIRALAX / GLYCOLAX) packet Take 17 g by mouth daily.  . potassium chloride SA (K-DUR,KLOR-CON) 20 MEQ tablet Take 1 tablet (20 mEq total) by mouth daily as needed.  . pravastatin (PRAVACHOL) 80 MG tablet take 1 tablet by mouth every evening  . RANEXA 1000 MG SR tablet take 1 tablet by mouth twice a day  . spironolactone (ALDACTONE) 25 MG tablet Take 25 mg by mouth daily.  . tamsulosin (FLOMAX) 0.4 MG CAPS Take 0.4 mg by mouth daily.  . traZODone (DESYREL) 50 MG tablet  Takes 1/2- 2 tablets at bedtime.  . triamcinolone ointment (KENALOG) 0.5 % Apply topically 2 (two) times daily.   Review of Systems  Constitutional: Negative.   HENT: Negative.   Eyes: Negative.   Respiratory: Negative.   Cardiovascular: Negative.   Gastrointestinal: Negative.   Endocrine: Negative.   Musculoskeletal: Positive for gait problem.       Severe leg pain, chronic  Skin: Negative.   Allergic/Immunologic: Negative.   Neurological: Negative.   Hematological: Negative.   Psychiatric/Behavioral: Negative.   All other systems reviewed and are negative.   BP 138/92  Pulse 85  Ht 5\' 3"  (1.6 m)  Wt 219 lb 12 oz (99.678 kg)  BMI 38.94 kg/m2  Physical Exam  Nursing note and vitals reviewed. Constitutional: He is oriented to person, place, and time. He appears well-developed and well-nourished.  HENT:  Head: Normocephalic.  Nose: Nose normal.  Mouth/Throat: Oropharynx is clear and moist.  Eyes: Conjunctivae are normal. Pupils are equal, round, and reactive to light.  Neck: Normal range of motion. Neck supple. No JVD present.  Cardiovascular: Normal rate, regular rhythm, S1 normal, S2 normal, normal heart sounds and intact distal pulses.  Exam reveals no gallop and no friction rub.   No murmur heard. Pulmonary/Chest: Effort normal and breath sounds normal. No respiratory distress. He has no wheezes. He has no rales. He exhibits no tenderness.  Abdominal: Soft. Bowel sounds are normal. He exhibits no distension. There is no tenderness.  Musculoskeletal: Normal range of motion. He exhibits no edema and no tenderness.  In the legs, in braces bilaterally  Lymphadenopathy:    He has no cervical adenopathy.  Neurological: He is alert and oriented to person, place, and time. Coordination abnormal.  Skin: Skin is warm and dry. No rash noted. No erythema.  Psychiatric: He has a normal mood and affect. His behavior is normal. Judgment and thought content normal.       Assessment and Plan

## 2013-11-12 NOTE — Assessment & Plan Note (Signed)
Heart rate has dramatically improved on metoprolol 150 mg twice a day

## 2013-11-12 NOTE — Assessment & Plan Note (Signed)
Blood pressure is well controlled on today's visit. No changes made to the medications. 

## 2013-11-12 NOTE — Patient Instructions (Signed)
You are doing well. No medication changes were made.  Please call us if you have new issues that need to be addressed before your next appt.  Your physician wants you to follow-up in: 6 months.  You will receive a reminder letter in the mail two months in advance. If you don't receive a letter, please call our office to schedule the follow-up appointment.   

## 2013-11-12 NOTE — Assessment & Plan Note (Signed)
No recent falls. Secondary to underlying leg weakness

## 2013-11-18 ENCOUNTER — Other Ambulatory Visit: Payer: Self-pay | Admitting: Cardiovascular Disease

## 2013-12-11 ENCOUNTER — Other Ambulatory Visit: Payer: Self-pay | Admitting: Cardiovascular Disease

## 2014-01-14 ENCOUNTER — Other Ambulatory Visit: Payer: Self-pay | Admitting: Cardiovascular Disease

## 2014-02-25 ENCOUNTER — Other Ambulatory Visit: Payer: Self-pay | Admitting: Cardiovascular Disease

## 2014-02-26 ENCOUNTER — Other Ambulatory Visit: Payer: Self-pay | Admitting: *Deleted

## 2014-02-26 MED ORDER — RANOLAZINE ER 1000 MG PO TB12: 1000.0000 mg | ORAL_TABLET | Freq: Two times a day (BID) | ORAL | Status: DC

## 2014-03-10 ENCOUNTER — Other Ambulatory Visit: Payer: Self-pay | Admitting: Cardiovascular Disease

## 2014-04-04 ENCOUNTER — Other Ambulatory Visit: Payer: Self-pay | Admitting: Cardiovascular Disease

## 2014-04-27 ENCOUNTER — Other Ambulatory Visit: Payer: Self-pay | Admitting: Cardiovascular Disease

## 2014-05-11 ENCOUNTER — Emergency Department
Admit: 2014-05-11 | Disposition: A | Discharge status: Home / Self Care | Payer: Self-pay | Admitting: Emergency Medicine

## 2014-05-11 ENCOUNTER — Other Ambulatory Visit: Payer: Self-pay | Admitting: Cardiovascular Disease

## 2014-05-11 LAB — URINALYSIS, COMPLETE
BACTERIA: NONE SEEN
Bilirubin,UR: NEGATIVE
Blood: NEGATIVE
GLUCOSE, UR: NEGATIVE mg/dL (ref 0–75)
Hyaline Cast: 2
KETONE: NEGATIVE
Leukocyte Esterase: NEGATIVE
Nitrite: NEGATIVE
Ph: 6 (ref 4.5–8.0)
Protein: NEGATIVE
RBC,UR: <1 /HPF (ref 0–5)
SPECIFIC GRAVITY: 1.01 (ref 1.003–1.030)
Squamous Epithelial: <1
WBC UR: NONE SEEN /HPF (ref 0–5)

## 2014-05-13 LAB — URINE CULTURE

## 2014-05-16 ENCOUNTER — Emergency Department
Admit: 2014-05-16 | Disposition: A | Discharge status: Home / Self Care | Payer: Self-pay | Admitting: Emergency Medicine

## 2014-05-16 LAB — URINALYSIS, COMPLETE
BILIRUBIN, UR: NEGATIVE
GLUCOSE, UR: NEGATIVE mg/dL (ref 0–75)
Ketone: NEGATIVE
Nitrite: NEGATIVE
Ph: 7 (ref 4.5–8.0)
Protein: 100
SPECIFIC GRAVITY: 1.018 (ref 1.003–1.030)
SQUAMOUS EPITHELIAL: NONE SEEN

## 2014-05-18 ENCOUNTER — Encounter: Payer: Self-pay | Admitting: Cardiovascular Disease

## 2014-05-18 ENCOUNTER — Ambulatory Visit (INDEPENDENT_AMBULATORY_CARE_PROVIDER_SITE_OTHER): Payer: Medicaid Other | Admitting: Cardiovascular Disease

## 2014-05-18 VITALS — BP 140/90 | HR 75 | Ht 63.0 in | Wt 200.0 lb

## 2014-05-18 DIAGNOSIS — E118 Type 2 diabetes mellitus with unspecified complications: Secondary | ICD-10-CM

## 2014-05-18 DIAGNOSIS — R079 Chest pain, unspecified: Secondary | ICD-10-CM

## 2014-05-18 DIAGNOSIS — E669 Obesity, unspecified: Secondary | ICD-10-CM | POA: Insufficient documentation

## 2014-05-18 DIAGNOSIS — I10 Essential (primary) hypertension: Secondary | ICD-10-CM

## 2014-05-18 DIAGNOSIS — R0602 Shortness of breath: Secondary | ICD-10-CM

## 2014-05-18 DIAGNOSIS — Z9989 Dependence on other enabling machines and devices: Secondary | ICD-10-CM

## 2014-05-18 DIAGNOSIS — R296 Repeated falls: Secondary | ICD-10-CM

## 2014-05-18 DIAGNOSIS — G4733 Obstructive sleep apnea (adult) (pediatric): Secondary | ICD-10-CM

## 2014-05-18 MED ORDER — ISOSORBIDE MONONITRATE ER 30 MG PO TB24: 30.0000 mg | ORAL_TABLET | Freq: Two times a day (BID) | ORAL | Status: DC

## 2014-05-18 NOTE — Patient Instructions (Addendum)
You are doing well.  Please stop the isosorbide dinitrate Take the ISOSORBIDE MONONITRATE twice a day  Please call us if you have new issues that need to be addressed before your next appt.  Your physician wants you to follow-up in: 6 months.  You will receive a reminder letter in the mail two months in advance. If you don't receive a letter, please call our office to schedule the follow-up appointment.

## 2014-05-18 NOTE — Assessment & Plan Note (Signed)
Weight continues to be a problem. Unable to exercise given his handicaps Urged weight loss through diet changes

## 2014-05-18 NOTE — Assessment & Plan Note (Signed)
Previously reported sugars running in the 200s on a regular basis. Recommended close follow-up with primary care. His weight has been trending upwards Now with abdominal obesity

## 2014-05-18 NOTE — Assessment & Plan Note (Signed)
Currently with no symptoms of chest pain

## 2014-05-18 NOTE — Progress Notes (Signed)
Patient ID: Cory Campbell, male    DOB: 06-11-1980, 34 y.o.   MRN: [id number]  HPI Comments: Cory Campbell is a 34 year-old gentleman with a history of Arthrogryposis multiplex congenita, chronic pain in his legs, asthma, hypertension, diabetes, severe obstructive sleep apnea on CPAP since April 2012, obesity, seizure disorder, psoriasis with several  evaluations in the emergency room for chest pain on April 23, 06/01/2011. He presents for routine followup of his hypertension and chest pain History of frequent falls, walks with leg braces and canes  In followup today, he reports that he is doing well.  Previously had elevated sugars,  managed by primary care. No recent falls or injuries per the patient Denies having any significant shortness of breath. No significant lower extremity edema. Does not take Lasix often No coughing or PND. Notes indicate he is on isosorbide mononitrate and dinitrate. He is unclear why he is on both, or which one he is taking He does not monitor his blood pressure at home, no blood pressure cuff  EKG shows normal sinus rhythm with rate 75 bpm, no significant ST or T-wave changes  Prior visits to the emergency room  at [facility] for abdominal pain. He was started on MiraLAX for constipation. emergency room for chest pain on 02/09/2012. Workup was essentially negative. Chest pain radiates to the right, the left, up to the shoulder, sometimes downward. Symptoms present at rest and with exertion.  Blood pressure has been relatively stable.     Allergies  Allergen Reactions  . Erythromycin Base   . Penicillins     Outpatient Encounter Prescriptions as of 05/18/2014  Medication Sig  . albuterol (PROVENTIL HFA;VENTOLIN HFA) 108 (90 BASE) MCG/ACT inhaler Inhale 2 puffs into the lungs every 4 (four) hours as needed.  . ALBUTEROL SULFATE PO 2.5mg /73mL as needed.  Marland Kitchen aspirin 81 MG tablet Take 81 mg by mouth daily.  . baclofen (LIORESAL) 10 MG tablet Take 10 mg by mouth 3  (three) times daily.  . beclomethasone (QVAR) 80 MCG/ACT inhaler Inhale 2 puffs into the lungs as needed.  . cetirizine (ZYRTEC) 10 MG chewable tablet Chew 10 mg by mouth daily.  . chlorthalidone (HYGROTON) 25 MG tablet Take 25 mg by mouth daily.  . citalopram (CELEXA) 20 MG tablet Take 20 mg by mouth daily.  Marland Kitchen dicyclomine (BENTYL) 20 MG tablet Take 20 mg by mouth every 6 (six) hours.  . finasteride (PROSCAR) 5 MG tablet Take 5 mg by mouth daily.  . fluticasone (FLONASE) 50 MCG/ACT nasal spray Place 2 sprays into the nose daily.  . furosemide (LASIX) 20 MG tablet take 1 tablet by mouth once daily if needed  . glucose blood test strip 1 each by Other route as needed. Use as instructed  . hydrALAZINE (APRESOLINE) 100 MG tablet take 1 tablet by mouth three times a day  . insulin aspart (NOVOLOG FLEXPEN) 100 UNIT/ML injection Inject 40 Units into the skin 3 (three) times daily with meals.   . insulin glargine (LANTUS) 100 UNIT/ML injection Inject 60 Units into the skin 2 (two) times daily.   . Insulin Pen Needle (NOVOFINE) 30G X 8 MM MISC Inject 1 packet into the skin 4 (four) times daily.  . isosorbide mononitrate (IMDUR) 30 MG 24 hr tablet Take 1 tablet (30 mg total) by mouth 2 (two) times daily.  Marland Kitchen lisinopril (PRINIVIL,ZESTRIL) 40 MG tablet Take 40 mg by mouth daily.  . metoprolol (LOPRESSOR) 100 MG tablet Take 1.5 tablets (150 mg total) by  mouth 2 (two) times daily.  . Multiple Vitamin (MULTIVITAMIN) tablet Take 1 tablet by mouth daily.  Marland Kitchen NITROSTAT 0.4 MG SL tablet place 1 tablet under the tongue if needed every 5 minutes for chest pain for 3 doses IF NO RELIEF AFTER 3RD DOSE CALL PRESCRIBER OR 911.  . omeprazole (PRILOSEC) 20 MG capsule Take 20 mg by mouth daily.  . polyethylene glycol (MIRALAX / GLYCOLAX) packet Take 17 g by mouth daily.  . potassium chloride SA (K-DUR,KLOR-CON) 20 MEQ tablet Take 1 tablet (20 mEq total) by mouth daily as needed.  . pravastatin (PRAVACHOL) 80 MG tablet take  1 tablet by mouth at bedtime  . ranolazine (RANEXA) 1000 MG SR tablet Take 1 tablet (1,000 mg total) by mouth 2 (two) times daily.  Marland Kitchen spironolactone (ALDACTONE) 25 MG tablet Take 25 mg by mouth daily.  . tamsulosin (FLOMAX) 0.4 MG CAPS Take 0.4 mg by mouth daily.  . traZODone (DESYREL) 50 MG tablet Takes 1/2- 2 tablets at bedtime.  . triamcinolone ointment (KENALOG) 0.5 % Apply topically 2 (two) times daily.  . [DISCONTINUED] isosorbide dinitrate (ISORDIL) 30 MG tablet Take 30 mg by mouth 2 (two) times daily.  . [DISCONTINUED] isosorbide mononitrate (IMDUR) 30 MG 24 hr tablet take 1 tablet by mouth twice a day    Past Medical History  Diagnosis Date  . Asthma   . HTN (hypertension)   . Premature baby     born premature  . Arthrogryposis   . Chest pain, unspecified   . Type II or unspecified type diabetes mellitus with neurological manifestations, not stated as uncontrolled   . Abrasion or friction burn of foot and toe(s), without mention of infection   . Other convulsions   . Diverticulitis of colon (without mention of hemorrhage)   . Chronic pain syndrome   . Diverticulitis of colon (without mention of hemorrhage)   . Myalgia and myositis, unspecified   . Other psoriasis   . Depressive disorder, not elsewhere classified   . Other and unspecified noninfectious gastroenteritis and colitis(558.9)   . Diabetes mellitus     Type II  . Hyperlipidemia   . Unspecified asthma(493.90)   . Allergic rhinitis, cause unspecified   . 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 or other cause, without mention of thyrotoxic crisis or storm     hyperthyroidism    Past Surgical History  Procedure Laterality Date  . Orthopedic surgery      hands, feet, knees, legs  . Hand surgery      left and right  . Leg surgery      left and right  . Tubes in ears      both  . Feet surgery      both  . Appendectomy      Social History  reports  that he has quit smoking. He has never used smokeless tobacco. He reports that he does not drink alcohol or use illicit drugs.  Family History Family history is unknown by patient.   Review of Systems  Constitutional: Negative.   Respiratory: Negative.   Cardiovascular: Negative.   Gastrointestinal: Negative.   Musculoskeletal: Positive for gait problem.       Severe leg pain, chronic  Skin: Negative.   Neurological: Negative.   Hematological: Negative.   Psychiatric/Behavioral: Negative.   All other systems reviewed and are negative.   BP 140/90 mmHg  Pulse 75  Ht  (1.6 m)  Wt 200 lb (  90.719 kg)  BMI 35.44 kg/m2  Physical Exam  Constitutional: He is oriented to person, place, and time. He appears well-developed and well-nourished.  Leg braces bilaterally, walks with canes  HENT:  Head: Normocephalic.  Nose: Nose normal.  Mouth/Throat: Oropharynx is clear and moist.  Eyes: Conjunctivae are normal. Pupils are equal, round, and reactive to light.  Neck: Normal range of motion. Neck supple. No JVD present.  Cardiovascular: Normal rate, regular rhythm, S1 normal, S2 normal, normal heart sounds and intact distal pulses.  Exam reveals no gallop and no friction rub.   No murmur heard. Pulmonary/Chest: Effort normal and breath sounds normal. No respiratory distress. He has no wheezes. He has no rales. He exhibits no tenderness.  Abdominal: Soft. Bowel sounds are normal. He exhibits no distension. There is no tenderness.  Musculoskeletal: Normal range of motion. He exhibits no edema or tenderness.  In the legs, in braces bilaterally  Lymphadenopathy:    He has no cervical adenopathy.  Neurological: He is alert and oriented to person, place, and time. Coordination abnormal.  Skin: Skin is warm and dry. No rash noted. No erythema.  Psychiatric: He has a normal mood and affect. His behavior is normal. Judgment and thought content normal.      Assessment and Plan   Nursing  note and vitals reviewed.

## 2014-05-18 NOTE — Assessment & Plan Note (Signed)
Compliant with his CPAP, stable

## 2014-05-18 NOTE — Assessment & Plan Note (Signed)
He denies any recent falls

## 2014-05-18 NOTE — Assessment & Plan Note (Signed)
Unclear why he is on isosorbide mononitrate and dinitrate. He denies any side effects to either one and in fact does not know which one he is taking if any. Recommended he hold the isosorbide dinitrate given the frequency of dosing. Recommended he continue isosorbide mononitrate 30 mg twice a day. Potentially this could be taken 60 mg once a day

## 2014-05-20 LAB — URINE CULTURE

## 2014-06-15 ENCOUNTER — Telehealth: Payer: Self-pay | Admitting: *Deleted

## 2014-06-15 NOTE — Telephone Encounter (Signed)
Pharmacy calling stating that we prescribed remex,  They want to verify if pt is suppose to take it twice a day or not.  Please advise.

## 2014-06-15 NOTE — Telephone Encounter (Signed)
[facility] the patient is taking Ranexa 1000 mg twice a day.

## 2014-10-09 ENCOUNTER — Encounter: Payer: Self-pay | Admitting: *Deleted

## 2014-10-22 ENCOUNTER — Telehealth: Payer: Self-pay | Admitting: Cardiovascular Disease

## 2014-10-22 MED ORDER — FUROSEMIDE 20 MG PO TABS: ORAL_TABLET | ORAL | Status: DC

## 2014-10-22 MED ORDER — POTASSIUM CHLORIDE CRYS ER 20 MEQ PO TBCR: 20.0000 meq | EXTENDED_RELEASE_TABLET | Freq: Every day | ORAL | Status: DC | PRN

## 2014-10-22 NOTE — Telephone Encounter (Signed)
Refill sent for Kdur 20 meq and Lasix 20 mg 90 day supply.

## 2014-10-22 NOTE — Telephone Encounter (Signed)
°  1. Which medications need to be refilled? Kdur 20 meq po daily prn  and Lasix 20 mg po daily prn  2. Which pharmacy is medication to be sent to? [facility]  3. Do they need a 30 day or 90 day supply? 90  4. Would they like a call back once the medication has been sent to the pharmacy? YES , patient says he has been calling and hasnt gotten the refill yet  Has been out of Kdur for a while- 2 months?

## 2014-10-26 ENCOUNTER — Ambulatory Visit: Payer: Self-pay | Admitting: Urology

## 2014-10-26 ENCOUNTER — Encounter: Payer: Self-pay | Admitting: Urology

## 2014-11-02 ENCOUNTER — Other Ambulatory Visit: Payer: Self-pay | Admitting: Cardiovascular Disease

## 2014-11-02 ENCOUNTER — Other Ambulatory Visit: Payer: Self-pay

## 2014-11-02 MED ORDER — NITROGLYCERIN 0.4 MG SL SUBL: 0.4000 mg | SUBLINGUAL_TABLET | SUBLINGUAL | Status: DC | PRN

## 2014-12-02 ENCOUNTER — Ambulatory Visit: Payer: Medicaid Other | Admitting: Cardiovascular Disease

## 2015-01-08 ENCOUNTER — Ambulatory Visit: Payer: Medicaid Other | Admitting: Nurse Practitioner

## 2015-01-28 ENCOUNTER — Other Ambulatory Visit: Payer: Self-pay | Admitting: Cardiovascular Disease

## 2015-02-17 ENCOUNTER — Ambulatory Visit: Payer: Medicaid Other | Admitting: Nurse Practitioner

## 2015-02-24 ENCOUNTER — Other Ambulatory Visit: Payer: Self-pay | Admitting: Cardiovascular Disease

## 2015-03-22 ENCOUNTER — Telehealth: Payer: Self-pay | Admitting: Cardiovascular Disease

## 2015-03-22 NOTE — Telephone Encounter (Signed)
3 attempts to schedule fu from recall list.  LMOV to call office for scheduling.   Deleting Recall.

## 2015-04-29 ENCOUNTER — Ambulatory Visit: Payer: Medicaid Other

## 2015-04-29 ENCOUNTER — Encounter: Payer: Self-pay | Admitting: *Deleted

## 2015-04-29 ENCOUNTER — Ambulatory Visit
Admission: EM | Admit: 2015-04-29 | Discharge: 2015-04-29 | Disposition: A | Discharge status: Home / Self Care | Payer: Medicaid Other | Attending: Emergency Medicine | Admitting: Emergency Medicine

## 2015-04-29 DIAGNOSIS — F17200 Nicotine dependence, unspecified, uncomplicated: Secondary | ICD-10-CM | POA: Diagnosis not present

## 2015-04-29 DIAGNOSIS — M1990 Unspecified osteoarthritis, unspecified site: Secondary | ICD-10-CM | POA: Insufficient documentation

## 2015-04-29 DIAGNOSIS — E785 Hyperlipidemia, unspecified: Secondary | ICD-10-CM | POA: Insufficient documentation

## 2015-04-29 DIAGNOSIS — F329 Major depressive disorder, single episode, unspecified: Secondary | ICD-10-CM | POA: Insufficient documentation

## 2015-04-29 DIAGNOSIS — R079 Chest pain, unspecified: Secondary | ICD-10-CM | POA: Diagnosis present

## 2015-04-29 DIAGNOSIS — F419 Anxiety disorder, unspecified: Secondary | ICD-10-CM | POA: Diagnosis not present

## 2015-04-29 DIAGNOSIS — Z993 Dependence on wheelchair: Secondary | ICD-10-CM | POA: Insufficient documentation

## 2015-04-29 DIAGNOSIS — Z7982 Long term (current) use of aspirin: Secondary | ICD-10-CM | POA: Insufficient documentation

## 2015-04-29 DIAGNOSIS — Z79899 Other long term (current) drug therapy: Secondary | ICD-10-CM | POA: Insufficient documentation

## 2015-04-29 DIAGNOSIS — N3281 Overactive bladder: Secondary | ICD-10-CM | POA: Insufficient documentation

## 2015-04-29 DIAGNOSIS — I10 Essential (primary) hypertension: Secondary | ICD-10-CM | POA: Insufficient documentation

## 2015-04-29 DIAGNOSIS — Z8673 Personal history of transient ischemic attack (TIA), and cerebral infarction without residual deficits: Secondary | ICD-10-CM | POA: Diagnosis not present

## 2015-04-29 DIAGNOSIS — E0590 Thyrotoxicosis, unspecified without thyrotoxic crisis or storm: Secondary | ICD-10-CM | POA: Insufficient documentation

## 2015-04-29 DIAGNOSIS — E669 Obesity, unspecified: Secondary | ICD-10-CM | POA: Insufficient documentation

## 2015-04-29 DIAGNOSIS — G4730 Sleep apnea, unspecified: Secondary | ICD-10-CM | POA: Insufficient documentation

## 2015-04-29 DIAGNOSIS — J45909 Unspecified asthma, uncomplicated: Secondary | ICD-10-CM | POA: Diagnosis not present

## 2015-04-29 DIAGNOSIS — N400 Enlarged prostate without lower urinary tract symptoms: Secondary | ICD-10-CM | POA: Diagnosis not present

## 2015-04-29 DIAGNOSIS — R0789 Other chest pain: Secondary | ICD-10-CM

## 2015-04-29 DIAGNOSIS — G894 Chronic pain syndrome: Secondary | ICD-10-CM | POA: Insufficient documentation

## 2015-04-29 DIAGNOSIS — Z880 Allergy status to penicillin: Secondary | ICD-10-CM | POA: Diagnosis not present

## 2015-04-29 DIAGNOSIS — Z794 Long term (current) use of insulin: Secondary | ICD-10-CM | POA: Diagnosis not present

## 2015-04-29 DIAGNOSIS — K219 Gastro-esophageal reflux disease without esophagitis: Secondary | ICD-10-CM | POA: Diagnosis not present

## 2015-04-29 DIAGNOSIS — E119 Type 2 diabetes mellitus without complications: Secondary | ICD-10-CM | POA: Insufficient documentation

## 2015-04-29 NOTE — ED Provider Notes (Signed)
CSN: [id number]     Arrival date & time 04/29/15  1513 History   First MD Initiated Contact with Patient 04/29/15 1702     Chief Complaint  Patient presents with  . Chest Pain   (Consider location/radiation/quality/duration/timing/severity/associated sxs/prior Treatment) HPI  35 yo M reports quarter sized area of tenderness right lateral mid -ribs present x 2 days  Denies fall, denies trauma, denies cough, fever , malaise, shortness of breath.  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  Has been  previously been able to mobilize for short distances with forearm crutches-  His doctor has recently encouraged him to decrease use of crutches and increase wheelchair time because  His shoulders are being strained by the increasing weight they bear on crutches as his knees are no longer supportive.  Hx smoke, bronchitis,pneumonia distant.  Past Medical History  Diagnosis Date  . Asthma   . HTN (hypertension)   . Premature baby     born premature  . Arthrogryposis   . Chest pain, unspecified   . Type II or unspecified type diabetes mellitus with neurological manifestations, not stated as uncontrolled   . Abrasion or friction burn of foot and toe(s), without mention of infection   . Other convulsions   . Diverticulitis of colon (without mention of hemorrhage)   . Chronic pain syndrome   . Anxiety   . Myalgia and myositis, unspecified   . Other psoriasis   . Depressive disorder, not elsewhere classified   . Other and unspecified noninfectious gastroenteritis and colitis(558.9)   . Acid reflux   . Hyperlipidemia   . Arthritis   . Allergic rhinitis, cause unspecified   . 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 or other cause, without mention of thyrotoxic crisis or storm     hyperthyroidism  . Adopted   . TIA (transient ischemic attack)   . Sleep apnea   . OAB (overactive bladder)   . Bladder wall  thickening   . BPH (benign prostatic hyperplasia)   . Hypogonadism in male   . Obesity    Past Surgical History  Procedure Laterality Date  . Orthopedic surgery      hands, feet, knees, legs  . Hand surgery      left and right  . Leg surgery      left and right  . Tubes in ears      both  . Feet surgery      both  . Appendectomy     History reviewed. No pertinent family history. Social History  Substance Use Topics  . Smoking status: Current Every Day Smoker -- 0 years  . Smokeless tobacco: Never Used     Comment: qiut in   . Alcohol Use: No     Comment: used to be moderate    Review of Systems Review of 10 systems negative for acute change except as referenced in HPI  Allergies  Erythromycin base and Penicillins  Home Medications   Prior to Admission medications   Medication Sig Start Date End Date Taking? Authorizing Provider  albuterol (PROVENTIL HFA;VENTOLIN HFA) 108 (90 BASE) MCG/ACT inhaler Inhale 2 puffs into the lungs every 4 (four) hours as needed.   Yes Historical Provider, MD  ALBUTEROL SULFATE PO 2.5mg /70mL as needed.   Yes Historical Provider, MD  aspirin 81 MG tablet Take 81 mg by mouth daily.   Yes Historical Provider, MD  baclofen (LIORESAL) 10 MG tablet Take 10  mg by mouth 3 (three) times daily.   Yes Historical Provider, MD  beclomethasone (QVAR) 80 MCG/ACT inhaler Inhale 2 puffs into the lungs as needed.   Yes Historical Provider, MD  cetirizine (ZYRTEC) 10 MG chewable tablet Chew 10 mg by mouth daily.   Yes Historical Provider, MD  chlorthalidone (HYGROTON) 25 MG tablet Take 25 mg by mouth daily.   Yes Historical Provider, MD  citalopram (CELEXA) 20 MG tablet Take 20 mg by mouth daily.   Yes Historical Provider, MD  dicyclomine (BENTYL) 20 MG tablet Take 20 mg by mouth every 6 (six) hours.   Yes Historical Provider, MD  finasteride (PROSCAR) 5 MG tablet Take 5 mg by mouth daily.   Yes Historical Provider, MD  fluticasone (FLONASE) 50 MCG/ACT nasal  spray Place 2 sprays into the nose daily.   Yes Historical Provider, MD  furosemide (LASIX) 20 MG tablet take 1 tablet by mouth once daily if needed 10/22/14  Yes Antonieta Iba, MD  glucose blood test strip 1 each by Other route as needed. Use as instructed   Yes Historical Provider, MD  hydrALAZINE (APRESOLINE) 100 MG tablet take 1 tablet by mouth three times a day 01/28/15  Yes Antonieta Iba, MD  hydrALAZINE (APRESOLINE) 100 MG tablet take 1 tablet by mouth three times a day 01/29/15  Yes Antonieta Iba, MD  Insulin Pen Needle (NOVOFINE) 30G X 8 MM MISC Inject 1 packet into the skin 4 (four) times daily.   Yes Historical Provider, MD  isosorbide mononitrate (IMDUR) 30 MG 24 hr tablet Take 1 tablet (30 mg total) by mouth 2 (two) times daily. 05/18/14  Yes Antonieta Iba, MD  lisinopril (PRINIVIL,ZESTRIL) 40 MG tablet Take 40 mg by mouth daily.   Yes Historical Provider, MD  metoprolol (LOPRESSOR) 100 MG tablet Take 1.5 tablets (150 mg total) by mouth 2 (two) times daily. 08/12/13  Yes Antonieta Iba, MD  Multiple Vitamin (MULTIVITAMIN) tablet Take 1 tablet by mouth daily.   Yes Historical Provider, MD  omeprazole (PRILOSEC) 20 MG capsule Take 20 mg by mouth daily.   Yes Historical Provider, MD  polyethylene glycol (MIRALAX / GLYCOLAX) packet Take 17 g by mouth daily.   Yes Historical Provider, MD  potassium chloride SA (K-DUR,KLOR-CON) 20 MEQ tablet Take 1 tablet (20 mEq total) by mouth daily as needed. 10/22/14  Yes Antonieta Iba, MD  pravastatin (PRAVACHOL) 80 MG tablet take 1 tablet by mouth at bedtime 02/25/15  Yes Antonieta Iba, MD  ranolazine (RANEXA) 1000 MG SR tablet Take 1 tablet (1,000 mg total) by mouth 2 (two) times daily. 02/26/14  Yes Antonieta Iba, MD  spironolactone (ALDACTONE) 25 MG tablet Take 25 mg by mouth daily.   Yes Historical Provider, MD  tamsulosin (FLOMAX) 0.4 MG CAPS Take 0.4 mg by mouth daily.   Yes Historical Provider, MD  traZODone (DESYREL) 50 MG tablet  Takes 1/2- 2 tablets at bedtime.   Yes Historical Provider, MD  triamcinolone ointment (KENALOG) 0.5 % Apply topically 2 (two) times daily.   Yes Historical Provider, MD  insulin aspart (NOVOLOG FLEXPEN) 100 UNIT/ML injection Inject 40 Units into the skin 3 (three) times daily with meals.     Historical Provider, MD  insulin glargine (LANTUS) 100 UNIT/ML injection Inject 60 Units into the skin 2 (two) times daily.     Historical Provider, MD  nitroGLYCERIN (NITROSTAT) 0.4 MG SL tablet Place 1 tablet (0.4 mg total) under the tongue every 5 (  five) minutes as needed for chest pain. Call 911 if chest is not resolved w/ 3 tabs. 11/02/14   Antonieta Ibaimothy J Gollan, MD   Meds Ordered and Administered this Visit  Medications - No data to display  BP 141/101 mmHg  Pulse 91  Temp(Src) 98.4 F (36.9 C) (Oral)  Resp 16  Ht 5\' 3"  (1.6 m)  Wt 198 lb (89.812 kg)  BMI 35.08 kg/m2  SpO2 100% No data found.   Physical Exam    Constitutional -alert and oriented,well appearing and in mild discomfort General: wheelchair, interactive and relaxed Head-atraumatic, normocephalic Eyes- conjunctiva normal, EOMI , Ears: grossly normal hearing Nose- no congestion or rhinorrhea Mouth/throat- mucous membranes moist Neck- supple without glandular enlargement CV- regular rate Resp-no distress, normal respiratory effort,clear to auscultation bilaterally GI- soft,non-tender,no distention GU- not examined MSK- point tenderness 2 cm area right lateral chest wall; soft tissue tenderness. Pressure on rib as well as above abd below yield same localized discomfort Hands with multiple finger deformities, patient denies any changes. ROM shoulders unchanged Neuro- normal speech and language,  Skin-warm,dry ,intact;  Psych-mood and affect grossly normal; speech and behavior grossly normal  ED Course  Procedures (including critical care time)  Labs Review Labs Reviewed - No data to display  Imaging Review Dg Ribs  Unilateral W/chest Right  04/29/2015  CLINICAL DATA:  Rib pain on the right-side for 2 days. Current smoker. History of pneumonia, bronchitis, hypertension, diabetes, MI, paralysis. EXAM: RIGHT RIBS AND CHEST - 3+ VIEW COMPARISON:  Chest x-ray dated 11/03/2013. FINDINGS: Single view of the chest and four views of the right ribs are provided. Study is hypoinspiratory with crowding of the perihilar bronchovascular markings. Given the low lung volumes, lungs are clear. No evidence of pneumonia. No pleural effusion or pneumothorax seen. Heart size is normal. Osseous structures about the chest are unremarkable. The right ribs appear intact and normal in mineralization. No acute or suspicious osseous lesions seen. IMPRESSION: Negative. Electronically Signed   By: Bary RichardStan  Maynard M.D.   On: 04/29/2015 17:16  Pertinent labs and imaging results that were available during my care of the patient were reviewed by me and considered in medical decision making. Xray results  discussed with patient and paper copy of same. Discussed contusion/bruise/  Local irritation. Plan therapy with heat pad or ice as preferred. Topical analgesic cream. Time and observation- report if exacerbation  MDM   1. Chest wall tenderness    Plan: x-ray results and diagnosis reviewed with patient/  Rx as per orders;  benefits, risks, potential side effects reviewed   Recommend supportive treatment with cyclic tylenol and ibuprofen; athletic creams,  Seek additional medical care if symptoms worsen or are not improving    Rae HalstedLaurie W Lee, PA-C 04/29/15 2220

## 2015-04-29 NOTE — Discharge Instructions (Signed)
As we discussed please try athletic rub or very localized pain cream to the area we identified as most tender. A heating pad is also helpful at times. Please be reasurred that the xrays report that the ribs and lungs do not appear to be injured or ill thus the tenderness most likely is in the tissue. This may be from a bump or fall a few days ago that is hardly remembered. It would be expected that the tenderness would steadily improve over next few days. Please report back to your PCP or to us or ER of choice should you experience an increase in discomfort, the onset of  coughing , shortness of breath or other symptoms that cause you concern. The status of the tenderness would be re-evaluated at that time.  Chest Wall Pain Chest wall pain is pain in or around the bones and muscles of your chest. Sometimes, an injury causes this pain. Sometimes, the cause may not be known. This pain may take several weeks or longer to get better. HOME CARE Pay attention to any changes in your symptoms. Take these actions to help with your pain:  Rest as told by your doctor.  Avoid activities that cause pain. Try not to use your chest, belly (abdominal), or side muscles to lift heavy things.  If directed, apply ice to the painful area:  Put ice in a plastic bag.  Place a towel between your skin and the bag.  Leave the ice on for 20 minutes, 2-3 times per day.  Take over-the-counter and prescription medicines only as told by your doctor.  Do not use tobacco products, including cigarettes, chewing tobacco, and e-cigarettes. If you need help quitting, ask your doctor.  Keep all follow-up visits as told by your doctor. This is important. GET HELP IF:  You have a fever.  Your chest pain gets worse.  You have new symptoms. GET HELP RIGHT AWAY IF:  You feel sick to your stomach (nauseous) or you throw up (vomit).  You feel sweaty or light-headed.  You have a cough with phlegm (sputum) or you cough up  blood.  You are short of breath.   This information is not intended to replace advice given to you by your health care provider. Make sure you discuss any questions you have with your health care provider.   Document Released: 07/12/2007 Document Revised: 10/14/2014 Document Reviewed: 04/20/2014 Elsevier Interactive Patient Education [employer]2016 [employer]

## 2015-04-29 NOTE — ED Notes (Signed)
Right side upper (axillary) rib pain 8/10, worsens with resp/movement. Denies injury or accompanying symptoms.

## 2015-04-30 ENCOUNTER — Ambulatory Visit: Payer: Medicaid Other | Admitting: Physician Assistant

## 2015-05-19 ENCOUNTER — Ambulatory Visit
Admission: EM | Admit: 2015-05-19 | Discharge: 2015-05-19 | Disposition: A | Discharge status: Home / Self Care | Payer: Medicaid Other | Attending: Family Medicine | Admitting: Family Medicine

## 2015-05-19 ENCOUNTER — Encounter: Payer: Self-pay | Admitting: *Deleted

## 2015-05-19 DIAGNOSIS — I10 Essential (primary) hypertension: Secondary | ICD-10-CM | POA: Insufficient documentation

## 2015-05-19 DIAGNOSIS — Z7982 Long term (current) use of aspirin: Secondary | ICD-10-CM | POA: Diagnosis not present

## 2015-05-19 DIAGNOSIS — Z8673 Personal history of transient ischemic attack (TIA), and cerebral infarction without residual deficits: Secondary | ICD-10-CM | POA: Diagnosis not present

## 2015-05-19 DIAGNOSIS — R350 Frequency of micturition: Secondary | ICD-10-CM | POA: Diagnosis not present

## 2015-05-19 DIAGNOSIS — M791 Myalgia: Secondary | ICD-10-CM | POA: Diagnosis not present

## 2015-05-19 DIAGNOSIS — K219 Gastro-esophageal reflux disease without esophagitis: Secondary | ICD-10-CM | POA: Insufficient documentation

## 2015-05-19 DIAGNOSIS — L409 Psoriasis, unspecified: Secondary | ICD-10-CM | POA: Insufficient documentation

## 2015-05-19 DIAGNOSIS — E291 Testicular hypofunction: Secondary | ICD-10-CM | POA: Diagnosis not present

## 2015-05-19 DIAGNOSIS — R739 Hyperglycemia, unspecified: Secondary | ICD-10-CM

## 2015-05-19 DIAGNOSIS — K529 Noninfective gastroenteritis and colitis, unspecified: Secondary | ICD-10-CM | POA: Insufficient documentation

## 2015-05-19 DIAGNOSIS — N400 Enlarged prostate without lower urinary tract symptoms: Secondary | ICD-10-CM | POA: Insufficient documentation

## 2015-05-19 DIAGNOSIS — F329 Major depressive disorder, single episode, unspecified: Secondary | ICD-10-CM | POA: Insufficient documentation

## 2015-05-19 DIAGNOSIS — N481 Balanitis: Secondary | ICD-10-CM | POA: Diagnosis not present

## 2015-05-19 DIAGNOSIS — E0590 Thyrotoxicosis, unspecified without thyrotoxic crisis or storm: Secondary | ICD-10-CM | POA: Insufficient documentation

## 2015-05-19 DIAGNOSIS — M549 Dorsalgia, unspecified: Secondary | ICD-10-CM | POA: Insufficient documentation

## 2015-05-19 DIAGNOSIS — K5732 Diverticulitis of large intestine without perforation or abscess without bleeding: Secondary | ICD-10-CM | POA: Insufficient documentation

## 2015-05-19 DIAGNOSIS — E119 Type 2 diabetes mellitus without complications: Secondary | ICD-10-CM | POA: Insufficient documentation

## 2015-05-19 DIAGNOSIS — M609 Myositis, unspecified: Secondary | ICD-10-CM | POA: Diagnosis not present

## 2015-05-19 DIAGNOSIS — J45909 Unspecified asthma, uncomplicated: Secondary | ICD-10-CM | POA: Insufficient documentation

## 2015-05-19 DIAGNOSIS — G4730 Sleep apnea, unspecified: Secondary | ICD-10-CM | POA: Insufficient documentation

## 2015-05-19 DIAGNOSIS — Z87898 Personal history of other specified conditions: Secondary | ICD-10-CM | POA: Insufficient documentation

## 2015-05-19 DIAGNOSIS — F419 Anxiety disorder, unspecified: Secondary | ICD-10-CM | POA: Diagnosis not present

## 2015-05-19 DIAGNOSIS — G894 Chronic pain syndrome: Secondary | ICD-10-CM | POA: Insufficient documentation

## 2015-05-19 DIAGNOSIS — E669 Obesity, unspecified: Secondary | ICD-10-CM | POA: Diagnosis not present

## 2015-05-19 DIAGNOSIS — N3281 Overactive bladder: Secondary | ICD-10-CM | POA: Diagnosis not present

## 2015-05-19 DIAGNOSIS — Z0189 Encounter for other specified special examinations: Secondary | ICD-10-CM | POA: Diagnosis present

## 2015-05-19 LAB — URINALYSIS COMPLETE WITH MICROSCOPIC (ARMC ONLY)
BACTERIA UA: NONE SEEN
Bilirubin Urine: NEGATIVE
Glucose, UA: 500 mg/dL — AB
KETONES UR: NEGATIVE mg/dL
Leukocytes, UA: NEGATIVE
NITRITE: NEGATIVE
PROTEIN: NEGATIVE mg/dL
SPECIFIC GRAVITY, URINE: 1.01 (ref 1.005–1.030)
WBC UA: NONE SEEN WBC/hpf (ref 0–5)
pH: 5.5 (ref 5.0–8.0)

## 2015-05-19 LAB — GLUCOSE, CAPILLARY: GLUCOSE-CAPILLARY: 385 mg/dL — AB (ref 65–99)

## 2015-05-19 LAB — CBC WITH DIFFERENTIAL/PLATELET
BASOS ABS: 0.1 10*3/uL (ref 0–0.1)
BASOS PCT: 1 %
Eosinophils Absolute: 0 10*3/uL (ref 0–0.7)
Eosinophils Relative: 1 %
HEMATOCRIT: 37.4 % — AB (ref 40.0–52.0)
HEMOGLOBIN: 11.5 g/dL — AB (ref 13.0–18.0)
Lymphocytes Relative: 20 %
Lymphs Abs: 1.8 10*3/uL (ref 1.0–3.6)
MCH: 22.5 pg — ABNORMAL LOW (ref 26.0–34.0)
MCHC: 30.8 g/dL — ABNORMAL LOW (ref 32.0–36.0)
MCV: 73.1 fL — ABNORMAL LOW (ref 80.0–100.0)
MONOS PCT: 7 %
Monocytes Absolute: 0.6 10*3/uL (ref 0.2–1.0)
NEUTROS ABS: 6.3 10*3/uL (ref 1.4–6.5)
NEUTROS PCT: 71 %
Platelets: 246 10*3/uL (ref 150–440)
RBC: 5.11 MIL/uL (ref 4.40–5.90)
RDW: 18.7 % — ABNORMAL HIGH (ref 11.5–14.5)
WBC: 8.7 10*3/uL (ref 3.8–10.6)

## 2015-05-19 LAB — BASIC METABOLIC PANEL
ANION GAP: 3 — AB (ref 5–15)
BUN: 10 mg/dL (ref 6–20)
CHLORIDE: 102 mmol/L (ref 101–111)
CO2: 27 mmol/L (ref 22–32)
Calcium: 8.4 mg/dL — ABNORMAL LOW (ref 8.9–10.3)
Creatinine, Ser: 0.77 mg/dL (ref 0.61–1.24)
GFR calc non Af Amer: >60 mL/min (ref >60)
Glucose, Bld: 401 mg/dL — ABNORMAL HIGH (ref 65–99)
POTASSIUM: 3.6 mmol/L (ref 3.5–5.1)
Sodium: 132 mmol/L — ABNORMAL LOW (ref 135–145)

## 2015-05-19 MED ORDER — CEPHALEXIN 500 MG PO CAPS: 500.0000 mg | ORAL_CAPSULE | Freq: Four times a day (QID) | ORAL | Status: AC

## 2015-05-19 MED ORDER — NYSTATIN 100000 UNIT/GM EX POWD: Freq: Three times a day (TID) | CUTANEOUS | Status: DC

## 2015-05-19 NOTE — Discharge Instructions (Signed)
Take medication as prescribed. Rest. Drink plenty of water. Keep penile area clean and dry. Keep foreskin area clean and dry. Monitor blood pressure at home. Eat healthy diet.   Manage diabetes and insulin as per your endocrinologist's recommendations as discussed. Call tomorrow to follow up, as discussed. This is very important.   Follow up with your primary care physician this week for follow up in 2-3 days. Return to Urgent care or ER for uncontrolled blood sugar, abdominal pain, back pain, urinary or bowel changes, new or worsening concerns.    Balanitis Balanitis is inflammation of the head of the penis (glans).  CAUSES  Balanitis has multiple causes, both infectious and noninfectious. Often balanitis is the result of poor personal hygiene, especially in uncircumcised males. Without adequate washing, viruses, bacteria, and yeast collect between the foreskin and the glans. This can cause an infection. Lack of air and irritation from a normal secretion called smegma contribute to the cause in uncircumcised males. Other causes include:  Chemical irritation from the use of certain soaps and shower gels (especially soaps with perfumes), condoms, personal lubricants, petroleum jelly, spermicides, and fabric conditioners.  Skin conditions, such as eczema, dermatitis, and psoriasis.  Allergies to drugs, such as tetracycline and sulfa.  Certain medical conditions, including liver cirrhosis, congestive heart failure, and kidney disease.  Morbid obesity. RISK FACTORS  Diabetes mellitus.  A tight foreskin that is difficult to pull back past the glans (phimosis).  Sex without the use of a condom. SIGNS AND SYMPTOMS  Symptoms may include:  Discharge coming from under the foreskin.  Tenderness.  Itching and inability to get an erection (because of the pain).  Redness and a rash.  Sores on the glans and on the foreskin. DIAGNOSIS Diagnosis of balanitis is confirmed through a physical  exam. TREATMENT The treatment is based on the cause of the balanitis. Treatment may include:  Frequent cleansing.  Keeping the glans and foreskin dry.  Use of medicines such as creams, pain medicines, antibiotics, or medicines to treat fungal infections.  Sitz baths. If the irritation has caused a scar on the foreskin that prevents easy retraction, a circumcision may be recommended.  HOME CARE INSTRUCTIONS  Sex should be avoided until the condition has cleared. MAKE SURE YOU:  Understand these instructions.  Will watch your condition.  Will get help right away if you are not doing well or get worse.   This information is not intended to replace advice given to you by your health care provider. Make sure you discuss any questions you have with your health care provider.   Document Released: 06/11/2008 Document Revised: 01/28/2013 Document Reviewed: 07/15/2012 Elsevier Interactive Patient Education 2016 Else[employer] Inc.  Hyperglycemia High blood sugar (hyperglycemia) means that the level of sugar in your blood is higher than it should be. Signs of high blood sugar include:  Feeling thirsty.  Frequent peeing (urinating).  Feeling tired or sleepy.  Dry mouth.  Vision changes.  Feeling weak.  Feeling hungry but losing weight.  Numbness and tingling in your hands or feet.  Headache. When you ignore these signs, your blood sugar may keep going up. These problems may get worse, and other problems may begin. HOME CARE  Check your blood sugars as told by your doctor. Write down the numbers with the date and time.  Take the right amount of insulin or diabetes pills at the right time. Write down the dose with date and time.  Refill your insulin or diabetes pills before running  out.  Watch what you eat. Follow your meal plan.  Drink liquids without sugar, such as water. Check with your doctor if you have kidney or heart disease.  Follow your doctor's orders for exercise.  Exercise at the same time of day.  Keep your doctor's appointments. GET HELP RIGHT AWAY IF:   You have trouble thinking or are confused.  You have fast breathing with fruity smelling breath.  You pass out (faint).  You have 2 to 3 days of high blood sugars and you do not know why.  You have chest pain.  You are feeling sick to your stomach (nauseous) or throwing up (vomiting).  You have sudden vision changes. MAKE SURE YOU:   Understand these instructions.  Will watch your condition.  Will get help right away if you are not doing well or get worse.   This information is not intended to replace advice given to you by your health care provider. Make sure you discuss any questions you have with your health care provider.   Document Released: 11/20/2008 Document Revised: 02/13/2014 Document Reviewed: 09/29/2014 Elsevier Interactive Patient Education [employer]

## 2015-05-19 NOTE — ED Notes (Signed)
Patient started having symptoms of burning on urination, urinary frequency, and back pain for 4 days. Patient reports being thirsty.

## 2015-05-19 NOTE — ED Provider Notes (Signed)
[facility]  ____________________________________________  Time seen: Approximately 5:27 PM  I have reviewed the triage vital signs and the nursing notes.   HISTORY  Chief Complaint Urinary Frequency and Back Pain   HPI Cory Campbell is a 35 y.o. male  presents for complaints of urinary frequency, urinary urgency and some burning with urination x 4 days. Patient reports last two days has been more thirsty than normal. Reports also with distal penile rash and states that distal penis itches. Reports history of similar with yeast infection to penis. States some low back pain, but that consistent with chronic low back pain, denies any acute changes in back pain. Denies urinary flow changes, hematuria, stool color changes, constipation or stool changes.   Denies falls or injury. Denies abdominal pain. Denies penile or testicular pain or swelling. Denies fevers, nausea, vomiting, diarrhea, chest pain, shortness of breath, weakness, dizziness, foul breath, or other complaints.   Patient reports blood sugars have been running 150-290 last few days, which per him is consistent with his normal, but states this morning and this afternoon blood sugar did not read on his monitor. States last ate a few hours ago.   PCP: Letta Pate    Past Medical History  Diagnosis Date  . Asthma   . HTN (hypertension)   . Premature baby     born premature  . Arthrogryposis   . Chest pain, unspecified   . Type II or unspecified type diabetes mellitus with neurological manifestations, not stated as uncontrolled   . Abrasion or friction burn of foot and toe(s), without mention of infection   . Other convulsions   . Diverticulitis of colon (without mention of hemorrhage)   . Chronic pain syndrome   . Anxiety   . Myalgia and myositis, unspecified   . Other psoriasis   . Depressive disorder, not elsewhere classified   . Other and unspecified noninfectious gastroenteritis and colitis(558.9)   . Acid  reflux   . Hyperlipidemia   . Arthritis   . Allergic rhinitis, cause unspecified   . 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 or other cause, without mention of thyrotoxic crisis or storm     hyperthyroidism  . Adopted   . TIA (transient ischemic attack)   . Sleep apnea   . OAB (overactive bladder)   . Bladder wall thickening   . BPH (benign prostatic hyperplasia)   . Hypogonadism in male   . Obesity     Patient Active Problem List   Diagnosis Date Noted  . Obesity 05/18/2014  . Frequent falls 08/12/2013  . Pain of left clavicle 08/12/2013  . Tachycardia 01/21/2013  . Obstructive sleep apnea on CPAP 06/07/2012  . Chest pain 06/20/2011  . Hypertension 06/20/2011  . Diabetes mellitus (HCC) 06/20/2011    Past Surgical History  Procedure Laterality Date  . Orthopedic surgery      hands, feet, knees, legs  . Hand surgery      left and right  . Leg surgery      left and right  . Tubes in ears      both  . Feet surgery      both  . Appendectomy      Current Outpatient Rx  Name  Route  Sig  Dispense  Refill  . albuterol (PROVENTIL HFA;VENTOLIN HFA) 108 (90 BASE) MCG/ACT inhaler   Inhalation   Inhale 2 puffs into the lungs every 4 (four) hours as needed.         Marland Kitchen  ALBUTEROL SULFATE PO      2.5mg /51mL as needed.         Marland Kitchen aspirin 81 MG tablet   Oral   Take 81 mg by mouth daily.         . beclomethasone (QVAR) 80 MCG/ACT inhaler   Inhalation   Inhale 2 puffs into the lungs as needed.         . Buprenorphine (BUTRANS) 15 MCG/HR PTWK   Transdermal   Place 15 mg onto the skin once a week.         . cetirizine (ZYRTEC) 10 MG chewable tablet   Oral   Chew 10 mg by mouth daily.         . chlorthalidone (HYGROTON) 25 MG tablet   Oral   Take 25 mg by mouth daily.         . citalopram (CELEXA) 20 MG tablet   Oral   Take 20 mg by mouth daily.         Marland Kitchen dicyclomine (BENTYL) 20 MG tablet    Oral   Take 20 mg by mouth every 6 (six) hours.         . finasteride (PROSCAR) 5 MG tablet   Oral   Take 5 mg by mouth daily.         . fluticasone (FLONASE) 50 MCG/ACT nasal spray   Nasal   Place 2 sprays into the nose daily.         . furosemide (LASIX) 20 MG tablet      take 1 tablet by mouth once daily if needed   90 tablet   3   . hydrALAZINE (APRESOLINE) 100 MG tablet      take 1 tablet by mouth three times a day   90 tablet   6   .           . isosorbide mononitrate (IMDUR) 30 MG 24 hr tablet   Oral   Take 1 tablet (30 mg total) by mouth 2 (two) times daily.   60 tablet   11   . lisinopril (PRINIVIL,ZESTRIL) 40 MG tablet   Oral   Take 40 mg by mouth daily.         . metoprolol (LOPRESSOR) 100 MG tablet   Oral   Take 1.5 tablets (150 mg total) by mouth 2 (two) times daily.   90 tablet   6   . Multiple Vitamin (MULTIVITAMIN) tablet   Oral   Take 1 tablet by mouth daily.         Marland Kitchen omeprazole (PRILOSEC) 20 MG capsule   Oral   Take 20 mg by mouth daily.         . polyethylene glycol (MIRALAX / GLYCOLAX) packet   Oral   Take 17 g by mouth daily.         . potassium chloride SA (K-DUR,KLOR-CON) 20 MEQ tablet   Oral   Take 1 tablet (20 mEq total) by mouth daily as needed.   90 tablet   3   . pravastatin (PRAVACHOL) 80 MG tablet      take 1 tablet by mouth at bedtime   30 tablet   3   . ranolazine (RANEXA) 1000 MG SR tablet   Oral   Take 1 tablet (1,000 mg total) by mouth 2 (two) times daily.   60 tablet   3   . spironolactone (ALDACTONE) 25 MG tablet   Oral   Take 25 mg by mouth  daily.         . tamsulosin (FLOMAX) 0.4 MG CAPS   Oral   Take 0.4 mg by mouth daily.         . traZODone (DESYREL) 50 MG tablet      Takes 1/2- 2 tablets at bedtime.         . triamcinolone ointment (KENALOG) 0.5 %   Topical   Apply topically 2 (two) times daily.         . baclofen (LIORESAL) 10 MG tablet   Oral   Take 10 mg by  mouth 3 (three) times daily.         Marland Kitchen glucose blood test strip   Other   1 each by Other route as needed. Use as instructed         . hydrALAZINE (APRESOLINE) 100 MG tablet      take 1 tablet by mouth three times a day   90 tablet   3   .           .           . Insulin Pen Needle (NOVOFINE) 30G X 8 MM MISC   Subcutaneous   Inject 1 packet into the skin 4 (four) times daily.         . nitroGLYCERIN (NITROSTAT) 0.4 MG SL tablet   Sublingual   Place 1 tablet (0.4 mg total) under the tongue every 5 (five) minutes as needed for chest pain. Call 911 if chest is not resolved w/ 3 tabs.   25 tablet   1   Humulin R 25 mg TID with meals   Allergies Erythromycin base and Penicillins   family history. Denies.  Reports adopted.   Social History Social History  Substance Use Topics  . Smoking status: Current Every Day Smoker -- 0 years  . Smokeless tobacco: Never Used     Comment: qiut in   . Alcohol Use: No     Comment: used to be moderate    Review of Systems Constitutional: No fever/chills Eyes: No visual changes. ENT: No sore throat. Cardiovascular: Denies chest pain. Respiratory: Denies shortness of breath. Gastrointestinal: No abdominal pain.  No nausea, no vomiting.  No diarrhea.  No constipation. Genitourinary: positive for dysuria. Musculoskeletal: Negative for back pain. Skin: Negative for rash. Neurological: Negative for headaches, focal weakness or numbness.  10-point ROS otherwise negative.  ____________________________________________   PHYSICAL EXAM:  VITAL SIGNS: ED Triage Vitals  Enc Vitals Group     BP 05/19/15 1624 159/101 mmHg     Pulse Rate 05/19/15 1624 84     Resp 05/19/15 1624 18     Temp 05/19/15 1624 98 F (36.7 C)     Temp Source 05/19/15 1624 Oral     SpO2 05/19/15 1624 100 %     Weight 05/19/15 1624 198 lb (89.812 kg)     Height 05/19/15 1624 5\' 3"  (1.6 m)     Head Cir --      Peak Flow --      Pain Score 05/19/15 1636  0     Pain Loc --      Pain Edu? --      Excl. in GC? --     Constitutional: Alert and oriented. Well appearing and in no acute distress. In wheelchair in room.  Eyes: Conjunctivae are normal. PERRL. EOMI. Head: Atraumatic.  Nose: No congestion/rhinnorhea.  Mouth/Throat: Mucous membranes are moist.  Oropharynx non-erythematous. Neck: No stridor.  No cervical spine tenderness to palpation. Cardiovascular: Normal rate, regular rhythm. Grossly normal heart sounds.  Good peripheral circulation. Respiratory: Normal respiratory effort.  No retractions. Lungs CTAB. No wheezes, rales or rhonchi.  Gastrointestinal: Soft and nontender. No distention. Normal Bowel sounds.  No abdominal bruits. No CVA tenderness. Male genitourinary: Exam completed with [name] RN at bedside as chaperone.  No penile tenderness, no testicular tenderness or testicular swelling. Uncircumcised. Foreskin retracted by patient with mildly ulcerated distal penis appearance with yellowish whitish discharge. No swelling. Foreskin replaced by patient.  Musculoskeletal: No lower or upper extremity tenderness nor edema.  Splints to bilateral lower extremities.  Neurologic:  Normal speech and language.  Skin:  Skin is warm, dry and intact. No rash noted. Psychiatric: Mood and affect are normal. Speech and behavior are normal.  ____________________________________________   LABS (all labs ordered are listed, but only abnormal results are displayed)  Labs Reviewed  URINALYSIS COMPLETEWITH MICROSCOPIC ([facility] ONLY) - Abnormal; Notable for the following:    Glucose, UA 500 (*)    Hgb urine dipstick TRACE (*)    Squamous Epithelial / LPF 0-5 (*)    All other components within normal limits  GLUCOSE, CAPILLARY - Abnormal; Notable for the following:    Glucose-Capillary 385 (*)    All other components within normal limits  CBC WITH DIFFERENTIAL/PLATELET - Abnormal; Notable for the following:    Hemoglobin 11.5 (*)    HCT 37.4 (*)     MCV 73.1 (*)    MCH 22.5 (*)    MCHC 30.8 (*)    RDW 18.7 (*)    All other components within normal limits  BASIC METABOLIC PANEL - Abnormal; Notable for the following:    Sodium 132 (*)    Glucose, Bld 401 (*)    Calcium 8.4 (*)    Anion gap 3 (*)    All other components within normal limits  URINE CULTURE     INITIAL IMPRESSION / ASSESSMENT AND PLAN / ED COURSE  Pertinent labs & imaging results that were available during my care of the patient were reviewed by me and considered in my medical decision making (see chart for details).  Discussed patient and plan of care with Dr Thurmond Butts. Very well-appearing patient. No acute distress. Presenting for urinary frequency, urinary urgency and some burning with urination over last 4 days. Also reports penile rash with itching. Patient also reports feeling thirsty and blood sugar was increased today. Patient with appearance of balanitis, suspect that this is yeast related and possibly due to patient reporting sweating a lot versus elevated blood sugars. Concern with secondary skin infection due to patient rubbing and scratching the area. Concern this may be secondary increasing his blood sugars which sound to have been already elevated at baseline. Will also evaluate labs.  Labs reviewed. Glucose 401. Urinalysis negative for bacteria, no WBCs, 500 glucose and trace hemoglobin, oxalate crystals present. Patient without CVA tenderness and no RBCs in urine. Patient denies acute increase in back pain. Patient reports that he does have history of kidney stones. Doubt kidney stone causing problem at this time but encouraged patient to have very close PCP follow-up and encouraged patient to have imaging if pain and back increases or continues. Patient states that he does not feel he is currently having this kidney stone and does not want imaging at this time.  Will treat balanitis with oral cephalexin (reports has taken this in past and tolerates well,  allergic to penicillin) and nystatin topical  powder. Encouraged cleaning and keeping area dry. Encouraged patient to monitor blood sugar very closely and monitor diet. Patient reports as per endocrinologist Dr Kerrie PleasureMoryati he will adjust his insulin tonight and take 10 extra units and check before dinner and before bed. Counseled eating protein dinner. Directed to follow up with endocrinologist regarding blood sugar levels tomorrow. Encouraged patient to follow-up in the next 2 days with primary care physician.  Discussed follow up with Primary care physician this week. Discussed follow up and return parameters to urgent care or er including no resolution or any worsening concerns. Patient verbalized understanding and agreed to plan.   ____________________________________________   FINAL CLINICAL IMPRESSION(S) / ED DIAGNOSES  Final diagnoses:  Balanitis  Hyperglycemia      Note: This dictation was prepared with Dragon dictation along with smaller phrase technology. Any transcriptional errors that result from this process are unintentional.    Renford DillsLindsey Seaborn Nakama, NP 05/19/15 2017  Renford DillsLindsey Aidyn Sportsman, NP 05/19/15 2018

## 2015-05-21 LAB — URINE CULTURE

## 2015-06-02 ENCOUNTER — Other Ambulatory Visit: Payer: Self-pay | Admitting: Cardiovascular Disease

## 2015-06-07 ENCOUNTER — Other Ambulatory Visit: Payer: Self-pay | Admitting: Cardiovascular Disease

## 2015-06-20 ENCOUNTER — Emergency Department: Payer: Medicaid Other

## 2015-06-20 ENCOUNTER — Emergency Department
Admission: EM | Admit: 2015-06-20 | Discharge: 2015-06-20 | Disposition: A | Discharge status: Home / Self Care | Payer: Medicaid Other | Attending: Emergency Medicine | Admitting: Emergency Medicine

## 2015-06-20 DIAGNOSIS — Z79899 Other long term (current) drug therapy: Secondary | ICD-10-CM | POA: Insufficient documentation

## 2015-06-20 DIAGNOSIS — E669 Obesity, unspecified: Secondary | ICD-10-CM | POA: Diagnosis not present

## 2015-06-20 DIAGNOSIS — Z794 Long term (current) use of insulin: Secondary | ICD-10-CM | POA: Insufficient documentation

## 2015-06-20 DIAGNOSIS — Z791 Long term (current) use of non-steroidal anti-inflammatories (NSAID): Secondary | ICD-10-CM | POA: Diagnosis not present

## 2015-06-20 DIAGNOSIS — E785 Hyperlipidemia, unspecified: Secondary | ICD-10-CM | POA: Insufficient documentation

## 2015-06-20 DIAGNOSIS — F17200 Nicotine dependence, unspecified, uncomplicated: Secondary | ICD-10-CM | POA: Diagnosis not present

## 2015-06-20 DIAGNOSIS — E119 Type 2 diabetes mellitus without complications: Secondary | ICD-10-CM | POA: Insufficient documentation

## 2015-06-20 DIAGNOSIS — Z8669 Personal history of other diseases of the nervous system and sense organs: Secondary | ICD-10-CM | POA: Insufficient documentation

## 2015-06-20 DIAGNOSIS — F329 Major depressive disorder, single episode, unspecified: Secondary | ICD-10-CM | POA: Insufficient documentation

## 2015-06-20 DIAGNOSIS — Z880 Allergy status to penicillin: Secondary | ICD-10-CM | POA: Insufficient documentation

## 2015-06-20 DIAGNOSIS — R079 Chest pain, unspecified: Secondary | ICD-10-CM | POA: Insufficient documentation

## 2015-06-20 DIAGNOSIS — Z8673 Personal history of transient ischemic attack (TIA), and cerebral infarction without residual deficits: Secondary | ICD-10-CM | POA: Insufficient documentation

## 2015-06-20 DIAGNOSIS — Z7982 Long term (current) use of aspirin: Secondary | ICD-10-CM | POA: Insufficient documentation

## 2015-06-20 DIAGNOSIS — J45909 Unspecified asthma, uncomplicated: Secondary | ICD-10-CM | POA: Insufficient documentation

## 2015-06-20 DIAGNOSIS — I10 Essential (primary) hypertension: Secondary | ICD-10-CM | POA: Insufficient documentation

## 2015-06-20 LAB — BASIC METABOLIC PANEL
Anion gap: 11 (ref 5–15)
BUN: 17 mg/dL (ref 6–20)
CALCIUM: 9.8 mg/dL (ref 8.9–10.3)
CHLORIDE: 101 mmol/L (ref 101–111)
CO2: 25 mmol/L (ref 22–32)
CREATININE: 0.71 mg/dL (ref 0.61–1.24)
GFR calc Af Amer: >60 mL/min (ref >60)
GFR calc non Af Amer: >60 mL/min (ref >60)
Glucose, Bld: 292 mg/dL — ABNORMAL HIGH (ref 65–99)
Potassium: 4.1 mmol/L (ref 3.5–5.1)
SODIUM: 137 mmol/L (ref 135–145)

## 2015-06-20 LAB — CBC
HCT: 45.8 % (ref 40.0–52.0)
Hemoglobin: 14.5 g/dL (ref 13.0–18.0)
MCH: 22.4 pg — AB (ref 26.0–34.0)
MCHC: 31.6 g/dL — ABNORMAL LOW (ref 32.0–36.0)
MCV: 70.8 fL — AB (ref 80.0–100.0)
PLATELETS: 266 10*3/uL (ref 150–440)
RBC: 6.47 MIL/uL — ABNORMAL HIGH (ref 4.40–5.90)
RDW: 21 % — AB (ref 11.5–14.5)
WBC: 7.7 10*3/uL (ref 3.8–10.6)

## 2015-06-20 LAB — TROPONIN I
Troponin I: <0.03 ng/mL (ref <0.031)
Troponin I: <0.03 ng/mL (ref <0.031)

## 2015-06-20 MED ORDER — MORPHINE SULFATE (PF) 4 MG/ML IV SOLN
4.0000 mg | Freq: Once | INTRAVENOUS | Status: AC
  Administered 2015-06-20: 4 mg via INTRAVENOUS

## 2015-06-20 MED ORDER — MORPHINE SULFATE (PF) 4 MG/ML IV SOLN
INTRAVENOUS | Status: AC
  Administered 2015-06-20: 4 mg via INTRAVENOUS
  Filled 2015-06-20: qty 1

## 2015-06-20 MED ORDER — MORPHINE SULFATE (PF) 2 MG/ML IV SOLN
2.0000 mg | Freq: Once | INTRAVENOUS | Status: AC
  Administered 2015-06-20: 2 mg via INTRAVENOUS
  Filled 2015-06-20: qty 1

## 2015-06-20 MED ORDER — ONDANSETRON HCL 4 MG/2ML IJ SOLN
4.0000 mg | Freq: Once | INTRAMUSCULAR | Status: AC
  Administered 2015-06-20: 4 mg via INTRAVENOUS
  Filled 2015-06-20: qty 2

## 2015-06-20 NOTE — ED Provider Notes (Signed)
[facility] Emergency Department Provider Note   ____________________________________________  Time seen: Seen upon arrival to the emergency department  I have reviewed the triage vital signs and the nursing notes.   HISTORY  Chief Complaint Chest Pain    HPI Cory Campbell is a 35 y.o. male with a history of nonspecific chest pain, TIA and arthrogryposis who is presenting to the emergency department today with 2 days of chest pain. He says that the chest pain was intermittent since Friday and left-sided. However, he says over the past 2 hours the pain has been constant. He took 3 nitros at home without any relief. He said the pain has been radiating to the right side of his chest. He says it is associated with shortness of breath and nausea. Denies any diaphoresis or vomiting. Not associated with exertion. Says that his lower extremities were swelling recently and he was seen [facility] where they increased his dose of Lasix. He says that he has lower extremity pain at this time bilaterally which is chronic for him. [facility]. Says the pain is a 7 out of 10 at this time. He says it feels like a sharp pain. He says it worsens when he takes a deep breath as well as when he moves his left arm.He denies any recent heavy lifting or overexertion. Denies any injury.   Past Medical History  Diagnosis Date  . Asthma   . HTN (hypertension)   . Premature baby     born premature  . Arthrogryposis   . Chest pain, unspecified   . Type II or unspecified type diabetes mellitus with neurological manifestations, not stated as uncontrolled   . Abrasion or friction burn of foot and toe(s), without mention of infection   . Other convulsions   . Diverticulitis of colon (without mention of hemorrhage)   . Chronic pain syndrome   . Anxiety   . Myalgia and myositis, unspecified   . Other psoriasis   . Depressive disorder, not elsewhere  classified   . Other and unspecified noninfectious gastroenteritis and colitis(558.9)   . Acid reflux   . Hyperlipidemia   . Arthritis   . Allergic rhinitis, cause unspecified   . 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 or other cause, without mention of thyrotoxic crisis or storm     hyperthyroidism  . Adopted   . TIA (transient ischemic attack)   . Sleep apnea   . OAB (overactive bladder)   . Bladder wall thickening   . BPH (benign prostatic hyperplasia)   . Hypogonadism in male   . Obesity     Patient Active Problem List   Diagnosis Date Noted  . Obesity 05/18/2014  . Frequent falls 08/12/2013  . Pain of left clavicle 08/12/2013  . Tachycardia 01/21/2013  . Obstructive sleep apnea on CPAP 06/07/2012  . Chest pain 06/20/2011  . Hypertension 06/20/2011  . Diabetes mellitus (HCC) 06/20/2011    Past Surgical History  Procedure Laterality Date  . Orthopedic surgery      hands, feet, knees, legs  . Hand surgery      left and right  . Leg surgery      left and right  . Tubes in ears      both  . Feet surgery      both  . Appendectomy      Current Outpatient Rx  Name  Route  Sig  Dispense  Refill  . albuterol (PROVENTIL HFA;VENTOLIN HFA) 108 (90 BASE) MCG/ACT inhaler   Inhalation   Inhale 2 puffs into the lungs every 4 (four) hours as needed for wheezing or shortness of breath.          Marland Kitchen albuterol (PROVENTIL) (2.5 MG/3ML) 0.083% nebulizer solution   Nebulization   Take 2.5 mg by nebulization every 4 (four) hours as needed for wheezing or shortness of breath.         . alfuzosin (UROXATRAL) 10 MG 24 hr tablet   Oral   Take 10 mg by mouth daily.         Marland Kitchen amLODipine (NORVASC) 10 MG tablet   Oral   Take 10 mg by mouth daily.         Marland Kitchen aspirin 81 MG tablet   Oral   Take 81 mg by mouth daily.         . baclofen (LIORESAL) 10 MG tablet   Oral   Take 10 mg by mouth 3 (three) times daily.           . beclomethasone (QVAR) 80 MCG/ACT inhaler   Inhalation   Inhale 2 puffs into the lungs 2 (two) times daily.          . Buprenorphine (BUTRANS) 15 MCG/HR PTWK   Transdermal   Place 15 mg onto the skin once a week. Put on Fridays.         . chlorthalidone (HYGROTON) 25 MG tablet   Oral   Take 25 mg by mouth daily.         . citalopram (CELEXA) 20 MG tablet   Oral   Take 20 mg by mouth daily.         . diclofenac sodium (VOLTAREN) 1 % GEL   Topical   Apply 2-4 g topically 4 (four) times daily as needed. For pain.         Marland Kitchen dicyclomine (BENTYL) 20 MG tablet   Oral   Take 20 mg by mouth every 6 (six) hours.         . finasteride (PROSCAR) 5 MG tablet   Oral   Take 5 mg by mouth daily.         . fluticasone (FLONASE) 50 MCG/ACT nasal spray   Nasal   Place 2 sprays into the nose daily.         . furosemide (LASIX) 20 MG tablet      take 1 tablet by mouth once daily if needed Patient taking differently: Take 20 mg by mouth daily.    90 tablet   3   . glucose blood test strip   Other   1 each by Other route as needed. Use as instructed         . hydrALAZINE (APRESOLINE) 100 MG tablet      take 1 tablet by mouth three times a day   90 tablet   6   . hydrocortisone cream 0.5 %   Topical   Apply 1 application topically 2 (two) times daily.         . Insulin Pen Needle (NOVOFINE) 30G X 8 MM MISC   Subcutaneous   Inject 1 packet into the skin 4 (four) times daily.         . insulin regular human CONCENTRATED (HUMULIN R U-500 KWIKPEN) 500 UNIT/ML kwikpen   Subcutaneous   Inject 25 Units into the skin 3 (three) times daily with meals.         Marland Kitchen  isosorbide mononitrate (IMDUR) 30 MG 24 hr tablet      take 1 tablet by mouth twice a day   60 tablet   3   . lisinopril (PRINIVIL,ZESTRIL) 40 MG tablet   Oral   Take 40 mg by mouth daily.         Marland Kitchen loratadine (CLARITIN) 10 MG tablet   Oral   Take 10 mg by mouth daily.         .  metoprolol (LOPRESSOR) 100 MG tablet   Oral   Take 1.5 tablets (150 mg total) by mouth 2 (two) times daily. Patient taking differently: Take 100 mg by mouth 2 (two) times daily.    90 tablet   6   . mirtazapine (REMERON) 45 MG tablet   Oral   Take 45 mg by mouth at bedtime.         . Multiple Vitamin (MULTIVITAMIN) tablet   Oral   Take 1 tablet by mouth daily.         . nitroGLYCERIN (NITROSTAT) 0.4 MG SL tablet   Sublingual   Place 1 tablet (0.4 mg total) under the tongue every 5 (five) minutes as needed for chest pain. Call 911 if chest is not resolved w/ 3 tabs.   25 tablet   1   . nortriptyline (PAMELOR) 25 MG capsule   Oral   Take 50 mg by mouth at bedtime.         Marland Kitchen nystatin (MYCOSTATIN) powder   Topical   Apply topically 3 (three) times daily. For 10 days   15 g   0   . omeprazole (PRILOSEC) 20 MG capsule   Oral   Take 20 mg by mouth daily.         . ondansetron (ZOFRAN-ODT) 4 MG disintegrating tablet   Oral   Take 4 mg by mouth every 8 (eight) hours as needed for nausea or vomiting.         . polyethylene glycol (MIRALAX / GLYCOLAX) packet   Oral   Take 17 g by mouth daily.         . potassium chloride SA (K-DUR,KLOR-CON) 20 MEQ tablet   Oral   Take 1 tablet (20 mEq total) by mouth daily as needed. Patient taking differently: Take 20 mEq by mouth daily.    90 tablet   3   . pravastatin (PRAVACHOL) 80 MG tablet      take 1 tablet by mouth at bedtime   30 tablet   3   . prednisoLONE acetate (PRED FORTE) 1 % ophthalmic suspension   Right Eye   Place 1 drop into the right eye 4 (four) times daily.         Marland Kitchen RANEXA 1000 MG SR tablet      take 1 tablet by mouth twice a day   60 tablet   3   . solifenacin (VESICARE) 10 MG tablet   Oral   Take 10 mg by mouth daily.         Marland Kitchen spironolactone (ALDACTONE) 25 MG tablet   Oral   Take 25 mg by mouth daily.         . tamsulosin (FLOMAX) 0.4 MG CAPS   Oral   Take 0.4 mg by mouth  daily.         . traMADol (ULTRAM) 50 MG tablet   Oral   Take 50 mg by mouth every 6 (six) hours as needed for moderate pain or severe pain.         Marland Kitchen  traZODone (DESYREL) 50 MG tablet      Takes 1/2- 2 tablets at bedtime.         . triamcinolone ointment (KENALOG) 0.5 %   Topical   Apply topically 2 (two) times daily.         Marland Kitchen zolpidem (AMBIEN) 10 MG tablet   Oral   Take 10 mg by mouth at bedtime.         . hydrALAZINE (APRESOLINE) 100 MG tablet      take 1 tablet by mouth three times a day   90 tablet   3     Allergies Erythromycin base and Penicillins  History reviewed. No pertinent family history.  Social History Social History  Substance Use Topics  . Smoking status: Current Every Day Smoker -- 0 years  . Smokeless tobacco: Never Used     Comment: qiut in   . Alcohol Use: No     Comment: used to be moderate    Review of Systems Constitutional: No fever/chills Eyes: No visual changes. ENT: No sore throat. Cardiovascular: As above Respiratory: As above Gastrointestinal: No abdominal pain.   no vomiting.  No diarrhea.  No constipation. Genitourinary: Negative for dysuria. Musculoskeletal: Negative for back pain. Skin: Negative for rash. Neurological: Negative for headaches or numbness.  10-point ROS otherwise negative.  ____________________________________________   PHYSICAL EXAM:  VITAL SIGNS: ED Triage Vitals  Enc Vitals Group     BP 06/20/15 1721 111/86 mmHg     Pulse Rate 06/20/15 1721 93     Resp 06/20/15 1721 17     Temp 06/20/15 1721 97.5 F (36.4 C)     Temp Source 06/20/15 1721 Oral     SpO2 06/20/15 1717 100 %     Weight 06/20/15 1721 198 lb (89.812 kg)     Height 06/20/15 1721 5\' 3"  (1.6 m)     Head Cir --      Peak Flow --      Pain Score 06/20/15 1723 9     Pain Loc --      Pain Edu? --      Excl. in GC? --     Constitutional: Alert and oriented. Well appearing and in no acute distress. Eyes: Conjunctivae are  normal. PERRL. EOMI. Head: Atraumatic. Nose: No congestion/rhinnorhea. Mouth/Throat: Mucous membranes are moist.  Oropharynx non-erythematous. Neck: No stridor.   Cardiovascular: Normal rate, regular rhythm. Grossly normal heart sounds.  Good peripheral circulation. Reproducible tenderness to the left side of the chest upon palpation. Respiratory: Normal respiratory effort.  No retractions. Lungs CTAB. Gastrointestinal: Soft and nontender. No distention. No abdominal bruits. No CVA tenderness. Musculoskeletal: No lower extremity edema.  Tenderness to the bilateral lower extremities which is distractible. The patient has cachectic lower extremities and is wearing bilateral lower extremity braces that extend from the bilateral ankles just below the knees bilaterally. Intact dorsalis pedis pulses bilaterally. Neurologic:  Normal speech and language. 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  Skin:  Skin is warm, dry and intact. No rash noted. Psychiatric: Mood and affect are normal. Speech and behavior are normal.  ____________________________________________   LABS (all labs ordered are listed, but only abnormal results are displayed)  Labs Reviewed  BASIC METABOLIC PANEL - Abnormal; Notable for the following:    Glucose, Bld 292 (*)    All other components within normal limits  CBC - Abnormal; Notable for the following:    RBC 6.47 (*)    MCV  70.8 (*)    MCH 22.4 (*)    MCHC 31.6 (*)    RDW 21.0 (*)    All other components within normal limits  TROPONIN I  TROPONIN I   ____________________________________________  EKG  ED ECG REPORT I, Deshay Kirstein,  Teena Irani, the attending physician, personally viewed and interpreted this ECG.   Date: 06/20/2015  EKG Time: 1721  Rate: 95  Rhythm: normal sinus rhythm  Axis: Normal axis  Intervals:none  ST&T Change: No ST elevation or depression. T-wave inversions in V6 as well as a biphasic-appearing T waves in  lead 1. No significant change when compared to multiple previous EKGs.  ____________________________________________  RADIOLOGY   DG Chest 2 View (Final result) Result time: 06/20/15 17:49:54   Final result by Rad Results In Interface (06/20/15 17:49:54)   Narrative:   CLINICAL DATA: Chest pain  EXAM: CHEST 2 VIEW  COMPARISON: 04/29/2015  FINDINGS: The heart size and mediastinal contours are within normal limits. Both lungs are clear. The visualized skeletal structures are unremarkable.  IMPRESSION: No active cardiopulmonary disease.   Electronically Signed By: Alcide Clever M.D. On: 06/20/2015 17:49    ____________________________________________   PROCEDURES    ____________________________________________   INITIAL IMPRESSION / ASSESSMENT AND PLAN / ED COURSE  Pertinent labs & imaging results that were available during my care of the patient were reviewed by me and considered in my medical decision making (see chart for details).  ----------------------------------------- 6:28 PM on 06/20/2015 -----------------------------------------  Patient discussed with Dr. Mariah Milling who is the patient's known cardiologist. Dr. Mariah Milling said that the patient had a stress test was normal 2013. I discussed the circumstances of the event with Dr. Nilda Simmer who says that it seems reasonable, especially with the patient's chronic chest pain, for outpatient workup as long as he is negative troponins that he is looking well.  ----------------------------------------- 9:14 PM on 06/20/2015 -----------------------------------------  Patient pain is relieved with morphine. Very reassuring workup including 2 negative troponins. We'll follow up with his cardiologist, Dr.Gollan, in the office. Long history of chest pain. Normal vital signs without hypoxia, hypotension or tachycardia. Unlikely to be PE. Also doubt thoracic aortic pathology secondary to no radiation to the back.  Explained the plan to the patient was understanding and willing to comply. To be discharged home.  ____________________________________________   FINAL CLINICAL IMPRESSION(S) / ED DIAGNOSES  Chest pain.    NEW MEDICATIONS STARTED DURING THIS VISIT:  New Prescriptions   No medications on file     Note:  This document was prepared using Dragon voice recognition software and may include unintentional dictation errors.    Myrna Blazer, MD 06/20/15 2115

## 2015-06-20 NOTE — ED Notes (Signed)
Pt taken to car in a wheelchair. Pt to be taken home by family. Pt in NAD at this time.

## 2015-06-20 NOTE — ED Notes (Signed)
Pt dressing self at this time. IV and monitors removed. Pt verbalized he has called a ride to pick him up. Pt in NAD at this time.

## 2015-06-20 NOTE — ED Notes (Signed)
Pt arrived from home via EMS c/o chest pain. Per EMS pt c/o left sided chest pain radiating to the right side for the past 2 hours. Pt reported to EMS taking 3 nitros without relief, pt given 324 of aspirin by EMS. Pt alert and oriented, in no acute distress at this time.

## 2015-06-20 NOTE — ED Notes (Signed)
Patient transported to X-ray 

## 2015-06-21 ENCOUNTER — Telehealth: Payer: Self-pay

## 2015-06-21 NOTE — Telephone Encounter (Signed)
-----   Message from Antonieta Ibaimothy J Gollan, MD sent at 06/20/2015  5:45 PM EDT ----- Seen in the ER sunday 06/20/15 He needs outpt lexiscan myoview for chest pain TG

## 2015-06-21 NOTE — Telephone Encounter (Signed)
Pt has an appt w/ Dr. Mariah MillingGollan 06/23/15.

## 2015-06-23 ENCOUNTER — Telehealth: Payer: Self-pay | Admitting: Cardiovascular Disease

## 2015-06-23 ENCOUNTER — Encounter (INDEPENDENT_AMBULATORY_CARE_PROVIDER_SITE_OTHER): Payer: Self-pay

## 2015-06-23 ENCOUNTER — Ambulatory Visit (INDEPENDENT_AMBULATORY_CARE_PROVIDER_SITE_OTHER): Payer: Medicaid Other | Admitting: Cardiovascular Disease

## 2015-06-23 ENCOUNTER — Encounter: Payer: Self-pay | Admitting: Cardiovascular Disease

## 2015-06-23 VITALS — BP 118/80 | HR 93 | Ht 63.0 in | Wt 187.5 lb

## 2015-06-23 DIAGNOSIS — R079 Chest pain, unspecified: Secondary | ICD-10-CM

## 2015-06-23 DIAGNOSIS — G4733 Obstructive sleep apnea (adult) (pediatric): Secondary | ICD-10-CM

## 2015-06-23 DIAGNOSIS — R0602 Shortness of breath: Secondary | ICD-10-CM | POA: Diagnosis not present

## 2015-06-23 DIAGNOSIS — E118 Type 2 diabetes mellitus with unspecified complications: Secondary | ICD-10-CM

## 2015-06-23 DIAGNOSIS — R296 Repeated falls: Secondary | ICD-10-CM

## 2015-06-23 DIAGNOSIS — E669 Obesity, unspecified: Secondary | ICD-10-CM

## 2015-06-23 DIAGNOSIS — I10 Essential (primary) hypertension: Secondary | ICD-10-CM | POA: Diagnosis not present

## 2015-06-23 DIAGNOSIS — Z9989 Dependence on other enabling machines and devices: Secondary | ICD-10-CM

## 2015-06-23 NOTE — Assessment & Plan Note (Signed)
We have encouraged continued careful diet management in an effort to lose weight.  

## 2015-06-23 NOTE — Patient Instructions (Addendum)
  No medication changes were made.  We will schedule an echocardiogram for shortness of breath, chest tightness Echocardiography is a painless test that uses sound waves to create images of your heart. It provides your doctor with information about the size and shape of your heart and how well your heart's chambers and valves are working. This procedure takes approximately one hour. There are no restrictions for this procedure.   Date & time: ________________________  Please call us if you have new issues that need to be addressed before your next appt.  Your physician wants you to follow-up in: 6 months.  You will receive a reminder letter in the mail two months in advance. If you don't receive a letter, please call our office to schedule the follow-up appointment.  Echocardiogram An echocardiogram, or echocardiography, uses sound waves (ultrasound) to produce an image of your heart. The echocardiogram is simple, painless, obtained within a short period of time, and offers valuable information to your health care provider. The images from an echocardiogram can provide information such as:  Evidence of coronary artery disease (CAD).  Heart size.  Heart muscle function.  Heart valve function.  Aneurysm detection.  Evidence of a past heart attack.  Fluid buildup around the heart.  Heart muscle thickening.  Assess heart valve function. LET [facility] KNOW ABOUT:  Any allergies you have.  All medicines you are taking, including vitamins, herbs, eye drops, creams, and over-the-counter medicines.  Previous problems you or members of your family have had with the use of anesthetics.  Any blood disorders you have.  Previous surgeries you have had.  Medical conditions you have.  Possibility of pregnancy, if this applies. BEFORE THE PROCEDURE  No special preparation is needed. Eat and drink normally.  PROCEDURE   In order to produce an image of your heart, gel  will be applied to your chest and a wand-like tool (transducer) will be moved over your chest. The gel will help transmit the sound waves from the transducer. The sound waves will harmlessly bounce off your heart to allow the heart images to be captured in real-time motion. These images will then be recorded.  You may need an IV to receive a medicine that improves the quality of the pictures. AFTER THE PROCEDURE You may return to your normal schedule including diet, activities, and medicines, unless your health care provider tells you otherwise.   This information is not intended to replace advice given to you by your health care provider. Make sure you discuss any questions you have with your health care provider.   Document Released: 01/21/2000 Document Revised: 02/13/2014 Document Reviewed: 09/30/2012 Elsevier Interactive Patient Education [employer]

## 2015-06-23 NOTE — Assessment & Plan Note (Signed)
Sugar levels have been running high Likely secondary to weight gain, poor diet Recommended close follow-up with primary care

## 2015-06-23 NOTE — Assessment & Plan Note (Signed)
Long prior history of atypical chest pain Previous stress tests with no ischemia Suspect musculoskeletal, exacerbated by using his crutches Echocardiogram has been ordered to rule out structural heart disease. None on file

## 2015-06-23 NOTE — Progress Notes (Signed)
Patient ID: Cory Campbell, male    DOB: 11-10-1980, 35 y.o.   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 He presents for routine followup of his hypertension and chest pain History of frequent falls, walks with leg braces and canes  Recently seen in the emergency room for chest pain We discussed the case with the emergency room physician. Pain was somewhat atypical, EKG was unchanged, cardiac enzymes negative. Sent home with follow-up today In follow-up, he reports having occasional symptoms from the left side radiating to the right. Continues to use his crutches, despite being told to use a motorized scooter given problems with his joints. Motorized scooter does not go everywhere that he needs to go, so has to use the crutches and try to walk Prior history of falls  Reports his sugars have been elevated, difficult to control, managed by primary care.  He reports having some shortness of breath, leg swelling. Some PND/orthopnea He does have sleep apnea, uses CPAP faithfully  EKG shows normal sinus rhythm with rate 93 bpm, no significant ST or T-wave changes  Other past medical history reviewed Prior [facility]. He was started on MiraLAX for constipation. emergency room for chest pain on 02/09/2012. Workup was essentially negative. Chest pain radiates to the right, the left, up to the shoulder, sometimes downward. Symptoms present at rest and with exertion.  Blood pressure has been relatively stable.     Allergies  Allergen Reactions  . Erythromycin Base Itching and Rash  . Penicillins Itching and Rash    Has patient had a  PCN reaction causing immediate rash, facial/tongue/throat swelling, SOB or lightheadedness with hypotension: Yes Has patient had a PCN reaction causing severe rash involving mucus membranes or skin necrosis: No Has patient had a PCN reaction that required hospitalization No Has patient had a PCN reaction occurring within the last 10 years: No If all of the above answers are "NO", then may proceed with Cephalosporin use.    Outpatient Encounter Prescriptions as of 06/23/2015  Medication Sig  . albuterol (PROVENTIL HFA;VENTOLIN HFA) 108 (90 BASE) MCG/ACT inhaler Inhale 2 puffs into the lungs every 4 (four) hours as needed for wheezing or shortness of breath.   Marland Kitchen. albuterol (PROVENTIL) (2.5 MG/3ML) 0.083% nebulizer solution Take 2.5 mg by nebulization every 4 (four) hours as needed for wheezing or shortness of breath.  . alfuzosin (UROXATRAL) 10 MG 24 hr tablet Take 10 mg by mouth daily.  Marland Kitchen. amLODipine (NORVASC) 10 MG tablet Take 10 mg by mouth daily.  Marland Kitchen. aspirin 81 MG tablet Take 81 mg by mouth daily.  . baclofen (LIORESAL) 10 MG tablet Take 10 mg by mouth 3 (three) times daily.  . beclomethasone (QVAR) 80 MCG/ACT inhaler Inhale 2 puffs into the lungs 2 (two) times daily.   . Buprenorphine (BUTRANS) 15 MCG/HR PTWK Place 15 mg onto the skin once a week. Put on Fridays.  . chlorthalidone (HYGROTON) 25 MG tablet Take 25 mg by mouth daily.  . citalopram (CELEXA) 20 MG tablet Take 20 mg by mouth daily.  . diclofenac sodium (VOLTAREN) 1 % GEL Apply 2-4 g topically 4 (four) times daily as needed. For pain.  .Marland Kitchen  dicyclomine (BENTYL) 20 MG tablet Take 20 mg by mouth every 6 (six) hours.  . finasteride (PROSCAR) 5 MG tablet Take 5 mg by mouth daily.  . fluticasone (FLONASE) 50 MCG/ACT nasal spray Place 2 sprays into the nose daily.  . furosemide (LASIX) 20 MG tablet take 1 tablet by mouth once daily if needed (Patient taking differently: Take 20 mg by mouth daily. )  . glucose blood test strip 1 each by Other  route as needed. Use as instructed  . hydrALAZINE (APRESOLINE) 100 MG tablet take 1 tablet by mouth three times a day  . hydrALAZINE (APRESOLINE) 100 MG tablet take 1 tablet by mouth three times a day  . hydrocortisone cream 0.5 % Apply 1 application topically 2 (two) times daily.  . Insulin Pen Needle (NOVOFINE) 30G X 8 MM MISC Inject 1 packet into the skin 4 (four) times daily.  . insulin regular human CONCENTRATED (HUMULIN R U-500 KWIKPEN) 500 UNIT/ML kwikpen Inject 25 Units into the skin 3 (three) times daily with meals.  . isosorbide mononitrate (IMDUR) 30 MG 24 hr tablet take 1 tablet by mouth twice a day  . lisinopril (PRINIVIL,ZESTRIL) 40 MG tablet Take 40 mg by mouth daily.  Marland Kitchen loratadine (CLARITIN) 10 MG tablet Take 10 mg by mouth daily.  . metoprolol (LOPRESSOR) 100 MG tablet Take 1.5 tablets (150 mg total) by mouth 2 (two) times daily. (Patient taking differently: Take 100 mg by mouth 2 (two) times daily. )  . mirtazapine (REMERON) 45 MG tablet Take 45 mg by mouth at bedtime.  . Multiple Vitamin (MULTIVITAMIN) tablet Take 1 tablet by mouth daily.  . nitroGLYCERIN (NITROSTAT) 0.4 MG SL tablet Place 1 tablet (0.4 mg total) under the tongue every 5 (five) minutes as needed for chest pain. Call 911 if chest is not resolved w/ 3 tabs.  . nortriptyline (PAMELOR) 25 MG capsule Take 50 mg by mouth at bedtime.  Marland Kitchen nystatin (MYCOSTATIN) powder Apply topically 3 (three) times daily. For 10 days  . omeprazole (PRILOSEC) 20 MG capsule Take 20 mg by mouth daily.  . ondansetron (ZOFRAN-ODT) 4 MG disintegrating tablet Take 4 mg by mouth every 8 (eight) hours as needed for nausea or vomiting.  . polyethylene glycol (MIRALAX / GLYCOLAX) packet Take 17 g by mouth daily.  . potassium chloride SA (K-DUR,KLOR-CON) 20 MEQ tablet Take 1 tablet (20 mEq total) by mouth daily as needed. (Patient taking differently: Take 20 mEq by mouth daily. )  . pravastatin (PRAVACHOL) 80 MG tablet take 1 tablet by mouth at  bedtime  . prednisoLONE acetate (PRED FORTE) 1 % ophthalmic suspension Place 1 drop into the right eye 4 (four) times daily.  Marland Kitchen RANEXA 1000 MG SR tablet take 1 tablet by mouth twice a day  . solifenacin (VESICARE) 10 MG tablet Take 10 mg by mouth daily.  Marland Kitchen spironolactone (ALDACTONE) 25 MG tablet Take 25 mg by mouth daily.  . tamsulosin (FLOMAX) 0.4 MG CAPS Take 0.4 mg by mouth daily.  . traMADol (ULTRAM) 50 MG tablet Take 50 mg by mouth every 6 (six) hours as needed for moderate pain or severe pain.  . traZODone (DESYREL) 50 MG tablet Takes 1/2- 2 tablets at bedtime.  . triamcinolone ointment (KENALOG) 0.5 % Apply topically 2 (two) times daily.  Marland Kitchen zolpidem (AMBIEN) 10 MG tablet Take 10 mg by mouth at bedtime.   No facility-administered encounter medications on file as of 06/23/2015.    Past Medical History  Diagnosis Date  . Asthma   .  HTN (hypertension)   . Premature baby     born premature  . Arthrogryposis   . Chest pain, unspecified   . Type II or unspecified type diabetes mellitus with neurological manifestations, not stated as uncontrolled   . Abrasion or friction burn of foot and toe(s), without mention of infection   . Other convulsions   . Diverticulitis of colon (without mention of hemorrhage)   . Chronic pain syndrome   . Anxiety   . Myalgia and myositis, unspecified   . Other psoriasis   . Depressive disorder, not elsewhere classified   . Other and unspecified noninfectious gastroenteritis and colitis(558.9)   . Acid reflux   . Hyperlipidemia   . Arthritis   . Allergic rhinitis, cause unspecified   . 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 or other cause, without mention of thyrotoxic crisis or storm     hyperthyroidism  . Adopted   . TIA (transient ischemic attack)   . Sleep apnea   . OAB (overactive bladder)   . Bladder wall thickening   . BPH (benign prostatic hyperplasia)   . Hypogonadism in male   .  Obesity     Past Surgical History  Procedure Laterality Date  . Orthopedic surgery      hands, feet, knees, legs  . Hand surgery      left and right  . Leg surgery      left and right  . Tubes in ears      both  . Feet surgery      both  . Appendectomy    . Corneal transplant      Social History  reports that he has been smoking.  He has never used smokeless tobacco. He reports that he does not drink alcohol or use illicit drugs.  Family History Family history is unknown by patient.  Review of Systems  Constitutional: Negative.   Respiratory: Positive for chest tightness and shortness of breath.   Cardiovascular: Negative.   Gastrointestinal: Negative.   Musculoskeletal: Positive for gait problem.       Severe leg pain, chronic  Skin: Negative.   Neurological: Negative.   Hematological: Negative.   Psychiatric/Behavioral: Negative.   All other systems reviewed and are negative.   BP 118/80 mmHg  Pulse 93  Ht 5\' 3"  (1.6 m)  Wt 187 lb 8 oz (85.049 kg)  BMI 33.22 kg/m2  Physical Exam  Constitutional: He is oriented to person, place, and time. He appears well-developed and well-nourished.  Leg braces bilaterally, walks with canes  HENT:  Head: Normocephalic.  Nose: Nose normal.  Mouth/Throat: Oropharynx is clear and moist.  Eyes: Conjunctivae are normal. Pupils are equal, round, and reactive to light.  Neck: Normal range of motion. Neck supple. No JVD present.  Cardiovascular: Normal rate, regular rhythm, S1 normal, S2 normal, normal heart sounds and intact distal pulses.  Exam reveals no gallop and no friction rub.   No murmur heard. Pulmonary/Chest: Effort normal and breath sounds normal. No respiratory distress. He has no wheezes. He has no rales. He exhibits no tenderness.  Abdominal: Soft. Bowel sounds are normal. He exhibits no distension. There is no tenderness.  Musculoskeletal: Normal range of motion. He exhibits no edema or tenderness.  Thin legs, in  braces bilaterally  Lymphadenopathy:    He has no cervical adenopathy.  Neurological: He is alert and oriented to person, place, and time. Coordination abnormal.  Skin: Skin is warm and dry.  No rash noted. No erythema.  Psychiatric: He has a normal mood and affect. His behavior is normal. Judgment and thought content normal.      Assessment and Plan   Nursing note and vitals reviewed.

## 2015-06-23 NOTE — Assessment & Plan Note (Addendum)
He reports compliance with his CPAP. Despite this does have some shortness of breath when supine Echocardiogram pending. Grossly no signs of acute diastolic CHF but may need higher dose Lasix if symptoms get worse

## 2015-06-23 NOTE — Assessment & Plan Note (Signed)
Blood pressure is well controlled on today's visit. No changes made to the medications. 

## 2015-06-23 NOTE — Assessment & Plan Note (Signed)
To minimize falls and joint problems, he currently uses a motorized scooter   Total encounter time more than 25 minutes  Greater than 50% was spent in counseling and coordination of care with the patient

## 2015-06-23 NOTE — Telephone Encounter (Signed)
Patient has transportation today at [id number] and mother wants to know if we can work him in the schedule any sooner.  Patient has an appt at 320.  Unable to move appt  As schedule is full.  Patient will come early per mother and wait in lobby.  Patient mother aware that we may not be able to see him early unless another patient cancels and he may have to wait until already scheduled appt. Time.

## 2015-06-23 NOTE — Assessment & Plan Note (Signed)
He reports having shortness of breath, leg edema, new symptoms for him Weight has been trending upwards which could contribute to symptoms Echocardiogram has been ordered to evaluate ejection fraction, right heart pressures

## 2015-07-13 ENCOUNTER — Other Ambulatory Visit: Payer: Medicaid Other

## 2015-08-02 ENCOUNTER — Other Ambulatory Visit: Payer: Self-pay | Admitting: Cardiovascular Disease

## 2015-08-15 ENCOUNTER — Encounter: Payer: Self-pay | Admitting: Gynecology

## 2015-08-15 ENCOUNTER — Ambulatory Visit
Admission: EM | Admit: 2015-08-15 | Discharge: 2015-08-15 | Disposition: A | Discharge status: Home / Self Care | Payer: Medicaid Other | Attending: Family Medicine | Admitting: Family Medicine

## 2015-08-15 DIAGNOSIS — E119 Type 2 diabetes mellitus without complications: Secondary | ICD-10-CM | POA: Insufficient documentation

## 2015-08-15 DIAGNOSIS — I10 Essential (primary) hypertension: Secondary | ICD-10-CM | POA: Diagnosis not present

## 2015-08-15 DIAGNOSIS — Z79899 Other long term (current) drug therapy: Secondary | ICD-10-CM | POA: Insufficient documentation

## 2015-08-15 DIAGNOSIS — F329 Major depressive disorder, single episode, unspecified: Secondary | ICD-10-CM | POA: Insufficient documentation

## 2015-08-15 DIAGNOSIS — Z8673 Personal history of transient ischemic attack (TIA), and cerebral infarction without residual deficits: Secondary | ICD-10-CM | POA: Diagnosis not present

## 2015-08-15 DIAGNOSIS — F419 Anxiety disorder, unspecified: Secondary | ICD-10-CM | POA: Diagnosis not present

## 2015-08-15 DIAGNOSIS — J45909 Unspecified asthma, uncomplicated: Secondary | ICD-10-CM | POA: Insufficient documentation

## 2015-08-15 DIAGNOSIS — R300 Dysuria: Secondary | ICD-10-CM | POA: Diagnosis present

## 2015-08-15 DIAGNOSIS — Z794 Long term (current) use of insulin: Secondary | ICD-10-CM | POA: Diagnosis not present

## 2015-08-15 DIAGNOSIS — Z7982 Long term (current) use of aspirin: Secondary | ICD-10-CM | POA: Insufficient documentation

## 2015-08-15 DIAGNOSIS — N3281 Overactive bladder: Secondary | ICD-10-CM | POA: Diagnosis not present

## 2015-08-15 DIAGNOSIS — Z880 Allergy status to penicillin: Secondary | ICD-10-CM | POA: Insufficient documentation

## 2015-08-15 DIAGNOSIS — N390 Urinary tract infection, site not specified: Secondary | ICD-10-CM | POA: Diagnosis not present

## 2015-08-15 DIAGNOSIS — G4730 Sleep apnea, unspecified: Secondary | ICD-10-CM | POA: Diagnosis not present

## 2015-08-15 DIAGNOSIS — E785 Hyperlipidemia, unspecified: Secondary | ICD-10-CM | POA: Diagnosis not present

## 2015-08-15 DIAGNOSIS — K219 Gastro-esophageal reflux disease without esophagitis: Secondary | ICD-10-CM | POA: Insufficient documentation

## 2015-08-15 DIAGNOSIS — N400 Enlarged prostate without lower urinary tract symptoms: Secondary | ICD-10-CM | POA: Diagnosis not present

## 2015-08-15 LAB — URINALYSIS COMPLETE WITH MICROSCOPIC (ARMC ONLY)
Bilirubin Urine: NEGATIVE
GLUCOSE, UA: 500 mg/dL — AB
Hgb urine dipstick: NEGATIVE
Ketones, ur: NEGATIVE mg/dL
Leukocytes, UA: NEGATIVE
Nitrite: NEGATIVE
Protein, ur: NEGATIVE mg/dL
Specific Gravity, Urine: 1.02 (ref 1.005–1.030)
pH: 7 (ref 5.0–8.0)

## 2015-08-15 MED ORDER — CIPROFLOXACIN HCL 500 MG PO TABS: 500.0000 mg | ORAL_TABLET | Freq: Two times a day (BID) | ORAL | Status: DC

## 2015-08-15 NOTE — ED Provider Notes (Addendum)
CSN: [id number]     Arrival date & time 08/15/15  1430 History   First MD Initiated Contact with Patient 08/15/15 1558     Chief Complaint  Patient presents with  . Cystitis   (Consider location/radiation/quality/duration/timing/severity/associated sxs/prior Treatment) HPI: Patient presents today with symptoms of left flank pain and dysuria. Patient states that he's had the symptoms for the last few days. Patient is diabetic. He denies any significant fluctuations in his blood sugar. He denies any fever, chest pain, shortness of breath, abdominal pain, nausea, vomiting. He states that he is an appointment with his primary care physician on Wednesday. He states he has had problems with cystitis/UTI in the past. He denies any penile discharge or problems with urine flow. He denies any history of STDs. He denies being sexually active.  Past Medical History  Diagnosis Date  . Asthma   . HTN (hypertension)   . Premature baby     born premature  . Arthrogryposis   . Chest pain, unspecified   . Type II or unspecified type diabetes mellitus with neurological manifestations, not stated as uncontrolled   . Abrasion or friction burn of foot and toe(s), without mention of infection   . Other convulsions   . Diverticulitis of colon (without mention of hemorrhage)   . Chronic pain syndrome   . Anxiety   . Myalgia and myositis, unspecified   . Other psoriasis   . Depressive disorder, not elsewhere classified   . Other and unspecified noninfectious gastroenteritis and colitis(558.9)   . Acid reflux   . Hyperlipidemia   . Arthritis   . Allergic rhinitis, cause unspecified   . 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 or other cause, without mention of thyrotoxic crisis or storm     hyperthyroidism  . Adopted   . TIA (transient ischemic attack)   . Sleep apnea   . OAB (overactive bladder)   . Bladder wall thickening   . BPH (benign prostatic  hyperplasia)   . Hypogonadism in male   . Obesity    Past Surgical History  Procedure Laterality Date  . Orthopedic surgery      hands, feet, knees, legs  . Hand surgery      left and right  . Leg surgery      left and right  . Tubes in ears      both  . Feet surgery      both  . Appendectomy    . Corneal transplant     Family History  Problem Relation Age of Onset  . Family history unknown: Yes   Social History  Substance Use Topics  . Smoking status: Current Every Day Smoker -- 0 years  . Smokeless tobacco: Never Used     Comment: qiut in   . Alcohol Use: No     Comment: used to be moderate    Review of Systems  Allergies  Erythromycin base and Penicillins  Home Medications   Prior to Admission medications   Medication Sig Start Date End Date Taking? Authorizing Provider  albuterol (PROVENTIL HFA;VENTOLIN HFA) 108 (90 BASE) MCG/ACT inhaler Inhale 2 puffs into the lungs every 4 (four) hours as needed for wheezing or shortness of breath.    Yes Historical Provider, MD  albuterol (PROVENTIL) (2.5 MG/3ML) 0.083% nebulizer solution Take 2.5 mg by nebulization every 4 (four) hours as needed for wheezing or shortness of breath.   Yes Historical Provider, MD  alfuzosin (UROXATRAL) 10  MG 24 hr tablet Take 10 mg by mouth daily.   Yes Historical Provider, MD  amLODipine (NORVASC) 10 MG tablet Take 10 mg by mouth daily.   Yes Historical Provider, MD  aspirin 81 MG tablet Take 81 mg by mouth daily.   Yes Historical Provider, MD  baclofen (LIORESAL) 10 MG tablet Take 10 mg by mouth 3 (three) times daily.   Yes Historical Provider, MD  beclomethasone (QVAR) 80 MCG/ACT inhaler Inhale 2 puffs into the lungs 2 (two) times daily.    Yes Historical Provider, MD  Buprenorphine (BUTRANS) 15 MCG/HR PTWK Place 15 mg onto the skin once a week. Put on Fridays.   Yes Historical Provider, MD  chlorthalidone (HYGROTON) 25 MG tablet Take 25 mg by mouth daily.   Yes Historical Provider, MD   citalopram (CELEXA) 20 MG tablet Take 20 mg by mouth daily.   Yes Historical Provider, MD  diclofenac sodium (VOLTAREN) 1 % GEL Apply 2-4 g topically 4 (four) times daily as needed. For pain.   Yes Historical Provider, MD  dicyclomine (BENTYL) 20 MG tablet Take 20 mg by mouth every 6 (six) hours.   Yes Historical Provider, MD  finasteride (PROSCAR) 5 MG tablet Take 5 mg by mouth daily.   Yes Historical Provider, MD  fluticasone (FLONASE) 50 MCG/ACT nasal spray Place 2 sprays into the nose daily.   Yes Historical Provider, MD  furosemide (LASIX) 20 MG tablet take 1 tablet by mouth once daily if needed Patient taking differently: Take 20 mg by mouth daily.  10/22/14  Yes Antonieta Iba, MD  glucose blood test strip 1 each by Other route as needed. Use as instructed   Yes Historical Provider, MD  hydrALAZINE (APRESOLINE) 100 MG tablet take 1 tablet by mouth three times a day 01/28/15  Yes Antonieta Iba, MD  hydrALAZINE (APRESOLINE) 100 MG tablet take 1 tablet by mouth three times a day 01/29/15  Yes Antonieta Iba, MD  hydrocortisone cream 0.5 % Apply 1 application topically 2 (two) times daily.   Yes Historical Provider, MD  Insulin Pen Needle (NOVOFINE) 30G X 8 MM MISC Inject 1 packet into the skin 4 (four) times daily.   Yes Historical Provider, MD  insulin regular human CONCENTRATED (HUMULIN R U-500 KWIKPEN) 500 UNIT/ML kwikpen Inject 25 Units into the skin 3 (three) times daily with meals.   Yes Historical Provider, MD  isosorbide mononitrate (IMDUR) 30 MG 24 hr tablet take 1 tablet by mouth twice a day 06/07/15  Yes Antonieta Iba, MD  lisinopril (PRINIVIL,ZESTRIL) 40 MG tablet Take 40 mg by mouth daily.   Yes Historical Provider, MD  loratadine (CLARITIN) 10 MG tablet Take 10 mg by mouth daily.   Yes Historical Provider, MD  metoprolol (LOPRESSOR) 100 MG tablet Take 1.5 tablets (150 mg total) by mouth 2 (two) times daily. Patient taking differently: Take 100 mg by mouth 2 (two) times  daily.  08/12/13  Yes Antonieta Iba, MD  mirtazapine (REMERON) 45 MG tablet Take 45 mg by mouth at bedtime.   Yes Historical Provider, MD  Multiple Vitamin (MULTIVITAMIN) tablet Take 1 tablet by mouth daily.   Yes Historical Provider, MD  nitroGLYCERIN (NITROSTAT) 0.4 MG SL tablet Place 1 tablet (0.4 mg total) under the tongue every 5 (five) minutes as needed for chest pain. Call 911 if chest is not resolved w/ 3 tabs. 11/02/14  Yes Antonieta Iba, MD  nitroGLYCERIN (NITROSTAT) 0.4 MG SL tablet place 1 tablet under  the tongue if needed every 5 minutes for chest pain for 3 doses IF NO RELIEF AFTER 3RD DOSE CALL PRESCRIBER OR 911. 08/03/15  Yes Antonieta Iba, MD  nortriptyline (PAMELOR) 25 MG capsule Take 50 mg by mouth at bedtime.   Yes Historical Provider, MD  nystatin (MYCOSTATIN) powder Apply topically 3 (three) times daily. For 10 days 05/19/15  Yes Renford Dills, NP  omeprazole (PRILOSEC) 20 MG capsule Take 20 mg by mouth daily.   Yes Historical Provider, MD  ondansetron (ZOFRAN-ODT) 4 MG disintegrating tablet Take 4 mg by mouth every 8 (eight) hours as needed for nausea or vomiting.   Yes Historical Provider, MD  polyethylene glycol (MIRALAX / GLYCOLAX) packet Take 17 g by mouth daily.   Yes Historical Provider, MD  potassium chloride SA (K-DUR,KLOR-CON) 20 MEQ tablet Take 1 tablet (20 mEq total) by mouth daily as needed. Patient taking differently: Take 20 mEq by mouth daily.  10/22/14  Yes Antonieta Iba, MD  pravastatin (PRAVACHOL) 80 MG tablet take 1 tablet by mouth at bedtime 02/25/15  Yes Antonieta Iba, MD  prednisoLONE acetate (PRED FORTE) 1 % ophthalmic suspension Place 1 drop into the right eye 4 (four) times daily.   Yes Historical Provider, MD  RANEXA 1000 MG SR tablet take 1 tablet by mouth twice a day 06/02/15  Yes Antonieta Iba, MD  solifenacin (VESICARE) 10 MG tablet Take 10 mg by mouth daily.   Yes Historical Provider, MD  spironolactone (ALDACTONE) 25 MG tablet Take 25  mg by mouth daily.   Yes Historical Provider, MD  tamsulosin (FLOMAX) 0.4 MG CAPS Take 0.4 mg by mouth daily.   Yes Historical Provider, MD  traMADol (ULTRAM) 50 MG tablet Take 50 mg by mouth every 6 (six) hours as needed for moderate pain or severe pain.   Yes Historical Provider, MD  traZODone (DESYREL) 50 MG tablet Takes 1/2- 2 tablets at bedtime.   Yes Historical Provider, MD  triamcinolone ointment (KENALOG) 0.5 % Apply topically 2 (two) times daily.   Yes Historical Provider, MD  zolpidem (AMBIEN) 10 MG tablet Take 10 mg by mouth at bedtime.   Yes Historical Provider, MD   Meds Ordered and Administered this Visit  Medications - No data to display  BP 163/123 mmHg  Pulse 88  Temp(Src) 98.7 F (37.1 C) (Oral)  Resp 18  Wt 165 lb (74.844 kg)  SpO2 100% No data found.   Physical Exam:  GENERAL: NAD RESP: CTA B CARD: RRR ABD: +colostomy bag, NT, no flank tenderness appreciated    ED Course  Procedures (including critical care time)  Labs Review Labs Reviewed  URINALYSIS COMPLETEWITH MICROSCOPIC ([facility] ONLY) - Abnormal; Notable for the following:    Glucose, UA 500 (*)    Bacteria, UA RARE (*)    Squamous Epithelial / LPF 0-5 (*)    All other components within normal limits    Imaging Review No results found.    MDM  A/P: UTI - Will treat patient with Cipro, monitor blood sugars closely, hydrate well, patient states that he is an appointment with his primary care physician on Wednesday. If his symptoms worsen before that time I recommend that he go to the ER.    Jolene Provost, MD 08/15/15 1610  Jolene Provost, MD 08/15/15 [phone]

## 2015-08-15 NOTE — ED Notes (Signed)
Patient c/o left side pain and burning with urination x 3 days ago.

## 2015-08-20 ENCOUNTER — Other Ambulatory Visit: Payer: Self-pay | Admitting: Cardiovascular Disease

## 2015-09-02 ENCOUNTER — Encounter: Payer: Self-pay | Admitting: Cardiovascular Disease

## 2015-10-31 ENCOUNTER — Other Ambulatory Visit: Payer: Self-pay | Admitting: Cardiovascular Disease

## 2015-11-26 ENCOUNTER — Ambulatory Visit: Payer: Medicaid Other | Admitting: Physician Assistant

## 2015-12-10 ENCOUNTER — Encounter: Payer: Self-pay | Admitting: *Deleted

## 2015-12-10 ENCOUNTER — Ambulatory Visit: Payer: Medicaid Other | Admitting: Cardiovascular Disease

## 2015-12-10 NOTE — Progress Notes (Deleted)
Cardiology Office Note  Date:  12/10/2015   ID:  Cory ShipperCharles J Campbell, DOB [birth year], MRN [id number]  PCP:  Emogene MorganAYCOCK, NGWE A, MD   No chief complaint on file.   HPI:  [name] is a 35 year-old gentleman with a history of Arthrogryposis multiplex congenita, chronic pain in his legs, asthma, hypertension, diabetes, severe obstructive sleep apnea on CPAP since April 2012, obesity, seizure disorder, psoriasis with several  evaluations in the emergency room for chest pain on April 23, 06/01/2011. He presents for routine followup of his hypertension and chest pain History of frequent falls, walks with leg braces and canes  Recently seen in the emergency room for chest pain We discussed the case with the emergency room physician. Pain was somewhat atypical, EKG was unchanged, cardiac enzymes negative. Sent home with follow-up today In follow-up, he reports having occasional symptoms from the left side radiating to the right. Continues to use his crutches, despite being told to use a motorized scooter given problems with his joints. Motorized scooter does not go everywhere that he needs to go, so has to use the crutches and try to walk Prior history of falls  Reports his sugars have been elevated, difficult to control, managed by primary care.  He reports having some shortness of breath, leg swelling. Some PND/orthopnea He does have sleep apnea, uses CPAP faithfully  EKG shows normal sinus rhythm with rate 93 bpm, no significant ST or T-wave changes  Other past medical history reviewed Prior [facility] for abdominal [facility]. He was started on MiraLAX for constipation. emergency room for chest pain on 02/09/2012. Workup was essentially negative. Chest pain radiates to the right, the left, up to the shoulder, sometimes downward. Symptoms present at rest and with exertion.  Blood pressure has been relatively stable.    PMH:   has a past medical history of Abrasion or  friction burn of foot and toe(s), without mention of infection; Acid reflux; Adopted; Allergic rhinitis, cause unspecified; Anxiety; Arthritis; Arthrogryposis; Asthma; Bladder wall thickening; BPH (benign prostatic hyperplasia); Chest pain, unspecified; Chronic pain syndrome; Depressive disorder, not elsewhere classified; Diverticulitis of colon (without mention of hemorrhage); HTN (hypertension); Hyperlipidemia; Hypogonadism in male; Myalgia and myositis, unspecified; OAB (overactive bladder); Obesity; Other and unspecified noninfectious gastroenteritis and colitis(558.9); Other convulsions (HCC); Other psoriasis; 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  PSH:    Past Surgical History:  Procedure Laterality Date  . APPENDECTOMY    . CORNEAL TRANSPLANT    . feet surgery     both  . HAND SURGERY     left and right  . leg surgery     left and right  . ORTHOPEDIC SURGERY     hands, feet, knees, legs  . tubes in ears     both    Current Outpatient Prescriptions  Medication Sig Dispense Refill  . albuterol (PROVENTIL HFA;VENTOLIN HFA) 108 (90 BASE) MCG/ACT inhaler Inhale 2 puffs into the lungs every 4 (four) hours as needed for wheezing or shortness of breath.     Marland Kitchen. albuterol (PROVENTIL) (2.5 MG/3ML) 0.083% nebulizer solution Take 2.5 mg by nebulization every 4 (four) hours as needed for wheezing or shortness of breath.    . alfuzosin (UROXATRAL) 10 MG 24 hr tablet Take 10  mg by mouth daily.    Marland Kitchen amLODipine (NORVASC) 10 MG tablet Take 10 mg by mouth daily.    Marland Kitchen aspirin 81 MG tablet Take 81 mg by mouth daily.    . baclofen (LIORESAL) 10 MG tablet Take 10 mg by mouth 3 (three) times daily.    . beclomethasone  (QVAR) 80 MCG/ACT inhaler Inhale 2 puffs into the lungs 2 (two) times daily.     . Buprenorphine (BUTRANS) 15 MCG/HR PTWK Place 15 mg onto the skin once a week. Put on Fridays.    . chlorthalidone (HYGROTON) 25 MG tablet Take 25 mg by mouth daily.    . ciprofloxacin (CIPRO) 500 MG tablet Take 1 tablet (500 mg total) by mouth every 12 (twelve) hours. 20 tablet 0  . citalopram (CELEXA) 20 MG tablet Take 20 mg by mouth daily.    . diclofenac sodium (VOLTAREN) 1 % GEL Apply 2-4 g topically 4 (four) times daily as needed. For pain.    Marland Kitchen dicyclomine (BENTYL) 20 MG tablet Take 20 mg by mouth every 6 (six) hours.    . finasteride (PROSCAR) 5 MG tablet Take 5 mg by mouth daily.    . fluticasone (FLONASE) 50 MCG/ACT nasal spray Place 2 sprays into the nose daily.    . furosemide (LASIX) 20 MG tablet take 1 tablet by mouth once daily if needed (Patient taking differently: Take 20 mg by mouth daily. ) 90 tablet 3  . glucose blood test strip 1 each by Other route as needed. Use as instructed    . hydrALAZINE (APRESOLINE) 100 MG tablet take 1 tablet by mouth three times a day 90 tablet 3  . hydrALAZINE (APRESOLINE) 100 MG tablet take 1 tablet by mouth three times a day 90 tablet 6  . hydrALAZINE (APRESOLINE) 100 MG tablet take 1 tablet by mouth three times a day 90 tablet 2  . hydrocortisone cream 0.5 % Apply 1 application topically 2 (two) times daily.    . Insulin Pen Needle (NOVOFINE) 30G X 8 MM MISC Inject 1 packet into the skin 4 (four) times daily.    . insulin regular human CONCENTRATED (HUMULIN R U-500 KWIKPEN) 500 UNIT/ML kwikpen Inject 25 Units into the skin 3 (three) times daily with meals.    . isosorbide mononitrate (IMDUR) 30 MG 24 hr tablet take 1 tablet by mouth twice a day 60 tablet 3  . lisinopril (PRINIVIL,ZESTRIL) 40 MG tablet Take 40 mg by mouth daily.    Marland Kitchen loratadine (CLARITIN) 10 MG tablet Take 10 mg by mouth daily.    . metoprolol (LOPRESSOR) 100 MG tablet Take 1.5 tablets (150 mg  total) by mouth 2 (two) times daily. (Patient taking differently: Take 100 mg by mouth 2 (two) times daily. ) 90 tablet 6  . mirtazapine (REMERON) 45 MG tablet Take 45 mg by mouth at bedtime.    . Multiple Vitamin (MULTIVITAMIN) tablet Take 1 tablet by mouth daily.    . nitroGLYCERIN (NITROSTAT) 0.4 MG SL tablet Place 1 tablet (0.4 mg total) under the tongue every 5 (five) minutes as needed for chest pain. Call 911 if chest is not resolved w/ 3 tabs. 25 tablet 1  . nitroGLYCERIN (NITROSTAT) 0.4 MG SL tablet place 1 tablet under the tongue if needed every 5 minutes for chest pain for 3 doses IF NO RELIEF AFTER 3RD DOSE CALL PRESCRIBER OR 911. 25 tablet 3  . nortriptyline (PAMELOR) 25 MG capsule Take 50 mg by mouth at bedtime.    Marland Kitchen  nystatin (MYCOSTATIN) powder Apply topically 3 (three) times daily. For 10 days 15 g 0  . omeprazole (PRILOSEC) 20 MG capsule Take 20 mg by mouth daily.    . ondansetron (ZOFRAN-ODT) 4 MG disintegrating tablet Take 4 mg by mouth every 8 (eight) hours as needed for nausea or vomiting.    . polyethylene glycol (MIRALAX / GLYCOLAX) packet Take 17 g by mouth daily.    . potassium chloride SA (K-DUR,KLOR-CON) 20 MEQ tablet Take 1 tablet (20 mEq total) by mouth daily as needed. (Patient taking differently: Take 20 mEq by mouth daily. ) 90 tablet 3  . pravastatin (PRAVACHOL) 80 MG tablet take 1 tablet by mouth at bedtime 30 tablet 2  . prednisoLONE acetate (PRED FORTE) 1 % ophthalmic suspension Place 1 drop into the right eye 4 (four) times daily.    Marland Kitchen. RANEXA 1000 MG SR tablet take 1 tablet by mouth twice a day 60 tablet 3  . solifenacin (VESICARE) 10 MG tablet Take 10 mg by mouth daily.    Marland Kitchen. spironolactone (ALDACTONE) 25 MG tablet Take 25 mg by mouth daily.    . tamsulosin (FLOMAX) 0.4 MG CAPS Take 0.4 mg by mouth daily.    . traMADol (ULTRAM) 50 MG tablet Take 50 mg by mouth every 6 (six) hours as needed for moderate pain or severe pain.    . traZODone (DESYREL) 50 MG tablet  Takes 1/2- 2 tablets at bedtime.    . triamcinolone ointment (KENALOG) 0.5 % Apply topically 2 (two) times daily.    Marland Kitchen. zolpidem (AMBIEN) 10 MG tablet Take 10 mg by mouth at bedtime.     No current facility-administered medications for this visit.      Allergies:   Erythromycin base and Penicillins   Social History:  The patient  reports that he has been smoking.  He has smoked for the past 0.00 years. He has never used smokeless tobacco. He reports that he does not drink alcohol or use drugs.   Family History:   Family history is unknown by patient.    Review of Systems: ROS   PHYSICAL EXAM: VS:  There were no vitals taken for this visit. , BMI There is no height or weight on file to calculate BMI. GEN: Well nourished, well developed, in no acute distress HEENT: normal Neck: no JVD, carotid bruits, or masses Cardiac: RRR; no murmurs, rubs, or gallops,no edema  Respiratory:  clear to auscultation bilaterally, normal work of breathing GI: soft, nontender, nondistended, + BS MS: no deformity or atrophy Skin: warm and dry, no rash Neuro:  Strength and sensation are intact Psych: euthymic mood, full affect    Recent Labs: 06/20/2015: BUN 17; Creatinine, Ser 0.71; Hemoglobin 14.5; Platelets 266; Potassium 4.1; Sodium 137    Lipid Panel No results found for: CHOL, HDL, LDLCALC, TRIG    Wt Readings from Last 3 Encounters:  08/15/15 165 lb (74.8 kg)  06/23/15 187 lb 8 oz (85 kg)  06/20/15 198 lb (89.8 kg)       ASSESSMENT AND PLAN:  No diagnosis found.   Disposition:   F/U  6 months  No orders of the defined types were placed in this encounter.    Signed, Dossie Arbourim Le Faulcon, M.D., Ph.D. 12/10/2015  [facility], [place] [phone]

## 2015-12-18 ENCOUNTER — Other Ambulatory Visit: Payer: Self-pay | Admitting: Cardiovascular Disease

## 2015-12-20 ENCOUNTER — Ambulatory Visit: Payer: Medicaid Other | Admitting: Cardiovascular Disease

## 2016-01-03 ENCOUNTER — Ambulatory Visit: Payer: Medicaid Other | Admitting: Cardiovascular Disease

## 2016-01-14 ENCOUNTER — Ambulatory Visit (INDEPENDENT_AMBULATORY_CARE_PROVIDER_SITE_OTHER): Payer: Medicaid Other | Admitting: Cardiovascular Disease

## 2016-01-14 ENCOUNTER — Encounter: Payer: Self-pay | Admitting: Cardiovascular Disease

## 2016-01-14 VITALS — BP 120/76 | HR 82 | Ht 63.0 in | Wt 178.0 lb

## 2016-01-14 DIAGNOSIS — E6609 Other obesity due to excess calories: Secondary | ICD-10-CM

## 2016-01-14 DIAGNOSIS — G4733 Obstructive sleep apnea (adult) (pediatric): Secondary | ICD-10-CM

## 2016-01-14 DIAGNOSIS — Z9989 Dependence on other enabling machines and devices: Secondary | ICD-10-CM

## 2016-01-14 DIAGNOSIS — E118 Type 2 diabetes mellitus with unspecified complications: Secondary | ICD-10-CM

## 2016-01-14 DIAGNOSIS — R296 Repeated falls: Secondary | ICD-10-CM

## 2016-01-14 DIAGNOSIS — Z6830 Body mass index (BMI) 30.0-30.9, adult: Secondary | ICD-10-CM

## 2016-01-14 DIAGNOSIS — R079 Chest pain, unspecified: Secondary | ICD-10-CM

## 2016-01-14 DIAGNOSIS — I10 Essential (primary) hypertension: Secondary | ICD-10-CM

## 2016-01-14 NOTE — Progress Notes (Signed)
Cardiology Office Note  Date:  01/14/2016   ID:  Cory Campbell Cory Campbell, DOB October 11, 1980, MRN [id number]  PCP:  Emogene Morgan, MD   Chief Complaint  Patient presents with  . other    6 month follow up. Meds reviewed by the pt. verbally. "doing well."     HPI:  Cory Campbell is a 35 year-old gentleman with a history of Arthrogryposis multiplex congenita, chronic pain in his legs, asthma, hypertension, diabetes, severe obstructive sleep apnea on CPAP since April 2012, obesity, seizure disorder, psoriasis with several  evaluations in the emergency room for chest pain on April 23, 06/01/2011. He presents for routine followup of his hypertension and chest pain History of frequent falls, walks with leg braces and canes  In follow-up today he reports that he is doing well Denies any chest pain symptoms Lab work reviewed with him No recent hemoglobin A1c available but he does report HBA1C 11? Now followed at [facility] for his diabetes LDL 90  He reported having Diarrhea in November 2017 Currently Resolved [facility]es to [facility], for [facility] care 1 recent fall, did not hurt himself  He does have sleep apnea, uses CPAP faithfully  EKG shows normal sinus rhythm with rate 82 bpm, no significant ST or T-wave changes  Other past medical history Previously seen in the emergency room for chest pain  Pain was somewhat atypical, EKG was unchanged, cardiac enzymes negative.   Continues to use his crutches, despite being told to use a motorized scooter given problems with his joints. Motorized scooter does not go everywhere that he needs to go, so has to use the crutches and try to walk Prior history of falls  Reports his sugars have been elevated, difficult to control, managed by primary care.  Prior visits to the emergency room  at [facility] for abdominal pain. He was started on MiraLAX for constipation. emergency room for chest pain on 02/09/2012. Workup was essentially negative. Chest pain radiates to the  right, the left, up to the shoulder, sometimes downward. Symptoms present at rest and with exertion.  Blood pressure has been relatively stable.    PMH:   has a past medical history of Abrasion or friction burn of foot and toe(s), without mention of infection; Acid reflux; Adopted; Allergic rhinitis, cause unspecified; Anxiety; Arthritis; Arthrogryposis; Asthma; Bladder wall thickening; BPH (benign prostatic hyperplasia); Chest pain, unspecified; Chronic pain syndrome; Depressive disorder, not elsewhere classified; Diverticulitis of colon (without mention of hemorrhage)(562.11); HTN (hypertension); Hyperlipidemia; Hypogonadism in male; Myalgia and myositis, unspecified; OAB (overactive bladder); Obesity; Other and unspecified noninfectious gastroenteritis and colitis(558.9); Other convulsions; Other psoriasis; 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  PSH:    Past Surgical History:  Procedure Laterality Date  . APPENDECTOMY    . CORNEAL TRANSPLANT    . feet surgery     both  . HAND SURGERY     left and right  . leg surgery     left and right  . ORTHOPEDIC SURGERY     hands, feet, knees, legs  . tubes in ears     both    Current Outpatient Prescriptions  Medication Sig Dispense Refill  . albuterol (PROVENTIL HFA;VENTOLIN HFA) 108 (90 BASE) MCG/ACT inhaler Inhale 2 puffs into the lungs every 4 (four) hours as needed for  wheezing or shortness of breath.     Marland Kitchen. albuterol (PROVENTIL) (2.5 MG/3ML) 0.083% nebulizer solution Take 2.5 mg by nebulization every 4 (four) hours as needed for wheezing or shortness of breath.    . alfuzosin (UROXATRAL) 10 MG 24 hr tablet Take 10 mg by mouth daily.    Marland Kitchen. amLODipine  (NORVASC) 10 MG tablet Take 10 mg by mouth daily.    Marland Kitchen. aspirin 81 MG tablet Take 81 mg by mouth daily.    . baclofen (LIORESAL) 10 MG tablet Take 10 mg by mouth 3 (three) times daily.    . beclomethasone (QVAR) 80 MCG/ACT inhaler Inhale 2 puffs into the lungs 2 (two) times daily.     . Buprenorphine (BUTRANS) 15 MCG/HR PTWK Place 15 mg onto the skin once a week. Put on Fridays.    . chlorthalidone (HYGROTON) 25 MG tablet Take 25 mg by mouth daily.    . citalopram (CELEXA) 20 MG tablet Take 20 mg by mouth daily.    . diclofenac sodium (VOLTAREN) 1 % GEL Apply 2-4 g topically 4 (four) times daily as needed. For pain.    Marland Kitchen. dicyclomine (BENTYL) 20 MG tablet Take 20 mg by mouth every 6 (six) hours.    . DOCOSAHEXAENOIC ACID PO Take by mouth.    . famotidine (PEPCID) 20 MG tablet Take 20 mg by mouth daily.    . finasteride (PROSCAR) 5 MG tablet Take 5 mg by mouth daily.    . fluticasone (FLONASE) 50 MCG/ACT nasal spray Place 2 sprays into the nose daily.    . furosemide (LASIX) 20 MG tablet take 1 tablet by mouth once daily if needed 90 tablet 3  . glucose blood test strip 1 each by Other route as needed. Use as instructed    . hydrALAZINE (APRESOLINE) 100 MG tablet take 1 tablet by mouth three times a day 90 tablet 2  . hydrocortisone cream 0.5 % Apply 1 application topically 2 (two) times daily.    Marland Kitchen. HYDROmorphone (DILAUDID) 4 MG tablet Take 4 mg by mouth every 3 (three) hours as needed.    . Insulin Pen Needle (NOVOFINE) 30G X 8 MM MISC Inject 1 packet into the skin 4 (four) times daily.    . isosorbide mononitrate (IMDUR) 30 MG 24 hr tablet take 1 tablet by mouth twice a day 60 tablet 3  . ketoconazole (NIZORAL) 2 % shampoo Apply topically.    Marland Kitchen. LANTUS SOLOSTAR 100 UNIT/ML Solostar Pen INJECT 0.5 ML (50 UNITS) UNDER THE SKIN NIGHTLY  4  . lisinopril (PRINIVIL,ZESTRIL) 40 MG tablet Take 40 mg by mouth daily.    Marland Kitchen. loratadine (CLARITIN) 10 MG tablet Take 10 mg by mouth daily.    . metoprolol  (LOPRESSOR) 100 MG tablet Take 1.5 tablets (150 mg total) by mouth 2 (two) times daily. (Patient taking differently: Take 100 mg by mouth 2 (two) times daily. ) 90 tablet 6  . miconazole (MICOTIN) 2 % cream Apply topically.    . mirtazapine (REMERON) 45 MG tablet Take 45 mg by mouth at bedtime.    . Multiple Vitamin (MULTIVITAMIN) tablet Take 1 tablet by mouth daily.    . naloxone (NARCAN) nasal spray 4 mg/0.1 mL One spray in either nostril once for known/suspected opioid overdose. May repeat every 2-3 minutes in alternating nostril til EMS arrives    . nitroGLYCERIN (NITROSTAT) 0.4 MG SL tablet Place 1 tablet (0.4 mg total) under the tongue every 5 (five) minutes as needed for chest pain.  Call 911 if chest is not resolved w/ 3 tabs. 25 tablet 1  . nortriptyline (PAMELOR) 25 MG capsule Take 50 mg by mouth at bedtime.    Marland Kitchen NOVOLOG FLEXPEN 100 UNIT/ML FlexPen 10 Units 3 (three) times daily.  4  . nystatin (MYCOSTATIN) powder Apply topically 3 (three) times daily. For 10 days 15 g 0  . ondansetron (ZOFRAN-ODT) 4 MG disintegrating tablet Take 4 mg by mouth every 8 (eight) hours as needed for nausea or vomiting.    . polyethylene glycol (MIRALAX / GLYCOLAX) packet Take 17 g by mouth daily.    . potassium chloride SA (K-DUR,KLOR-CON) 20 MEQ tablet Take 1 tablet (20 mEq total) by mouth daily as needed. (Patient taking differently: Take 20 mEq by mouth daily. ) 90 tablet 3  . pravastatin (PRAVACHOL) 80 MG tablet take 1 tablet by mouth at bedtime 30 tablet 3  . prednisoLONE acetate (PRED FORTE) 1 % ophthalmic suspension Place 1 drop into the right eye 4 (four) times daily.    Marland Kitchen RA MELATONIN 1 MG SUBL 1 mg daily.  0  . solifenacin (VESICARE) 10 MG tablet Take 10 mg by mouth daily.    Marland Kitchen spironolactone (ALDACTONE) 25 MG tablet Take 25 mg by mouth daily.    . tamsulosin (FLOMAX) 0.4 MG CAPS Take 0.4 mg by mouth daily.    Marland Kitchen tiZANidine (ZANAFLEX) 4 MG capsule Take 4 mg by mouth.    . traMADol (ULTRAM) 50 MG  tablet Take 50 mg by mouth every 6 (six) hours as needed for moderate pain or severe pain.    . traMADol (ULTRAM) 50 MG tablet Take 50 mg by mouth every 6 (six) hours as needed.    . traZODone (DESYREL) 50 MG tablet Takes 1/2- 2 tablets at bedtime.    Marland Kitchen zolpidem (AMBIEN) 10 MG tablet Take 10 mg by mouth at bedtime.     No current facility-administered medications for this visit.      Allergies:   Penicillins; Erythromycin base; and Erythromycin base   Social History:  The patient  reports that he has been smoking.  He has smoked for the past 0.00 years. He has never used smokeless tobacco. He reports that he does not drink alcohol or use drugs.   Family History:   Family history is unknown by patient.    Review of Systems: Review of Systems  Constitutional: Negative.   Respiratory: Negative.   Cardiovascular: Negative.   Gastrointestinal: Negative.   Musculoskeletal: Negative.        Unsteady gait, leg weakness  Neurological: Negative.   Psychiatric/Behavioral: Negative.   All other systems reviewed and are negative.    PHYSICAL EXAM: VS:  BP 120/76 (BP Location: Right Arm, Patient Position: Sitting, Cuff Size: Normal)   Pulse 82   Ht 5\' 3"  (1.6 m)   Wt 178 lb (80.7 kg)   BMI 31.53 kg/m  , BMI Body mass index is 31.53 kg/m. GEN: Well nourished, well developed, in no acute distress  HEENT: normal  Neck: no JVD, carotid bruits, or masses Cardiac: RRR; no murmurs, rubs, or gallops,no edema  Respiratory:  clear to auscultation bilaterally, normal work of breathing GI: soft, nontender, nondistended, + BS MS: no deformity  , Atrophy lower extremities/ Muscle wasting, braces in place Skin: warm and dry, no rash Neuro:  Strength and sensation are intact Psych: euthymic mood, full affect    Recent Labs: 06/20/2015: BUN 17; Creatinine, Ser 0.71; Hemoglobin 14.5; Platelets 266; Potassium 4.1; Sodium 137  Lipid Panel No results found for: CHOL, HDL, LDLCALC, TRIG    Wt  Readings from Last 3 Encounters:  01/14/16 178 lb (80.7 kg)  08/15/15 165 lb (74.8 kg)  06/23/15 187 lb 8 oz (85 kg)       ASSESSMENT AND PLAN:  Essential hypertension - Plan: EKG 12-Lead Blood pressure is well controlled on today's visit. No changes made to the medications.  Class 1 obesity due to excess calories without serious comorbidity with body mass index (BMI) of 30.0 to 30.9 in adult Unable to exercise secondary to disabilities Recommended strict low carbohydrate diet  Frequent falls Walks with canes bilaterally Uses a scooter  Obstructive sleep apnea on CPAP Compliant with his CPAP  Chest pain, unspecified type Previous history of atypical chest pain No further workup at this time, no recent symptoms  Type 2 diabetes mellitus with complication, without long-term current use of insulin (HCC) [facility] Recommended low carbohydrate diet   Total encounter time more than 25 minutes  Greater than 50% was spent in counseling and coordination of care with the patient  Disposition:   F/U  12 months   Orders Placed This Encounter  Procedures  . EKG 12-Lead     Signed, Dossie Arbourim Yezenia Fredrick, M.D., Ph.D. 01/14/2016  [facility], [place] [phone]

## 2016-01-14 NOTE — Patient Instructions (Signed)

## 2016-02-05 ENCOUNTER — Other Ambulatory Visit: Payer: Self-pay | Admitting: Cardiovascular Disease

## 2016-02-09 ENCOUNTER — Other Ambulatory Visit: Payer: Self-pay | Admitting: Cardiovascular Disease

## 2016-02-26 ENCOUNTER — Emergency Department
Admission: EM | Admit: 2016-02-26 | Discharge: 2016-02-26 | Disposition: A | Discharge status: Home / Self Care | Payer: Medicaid Other | Attending: Student in an Organized Health Care Education/Training Program | Admitting: Student in an Organized Health Care Education/Training Program

## 2016-02-26 ENCOUNTER — Encounter: Payer: Self-pay | Admitting: Radiology

## 2016-02-26 ENCOUNTER — Emergency Department: Payer: Medicaid Other

## 2016-02-26 DIAGNOSIS — I10 Essential (primary) hypertension: Secondary | ICD-10-CM | POA: Insufficient documentation

## 2016-02-26 DIAGNOSIS — Z794 Long term (current) use of insulin: Secondary | ICD-10-CM | POA: Insufficient documentation

## 2016-02-26 DIAGNOSIS — Z79899 Other long term (current) drug therapy: Secondary | ICD-10-CM | POA: Diagnosis not present

## 2016-02-26 DIAGNOSIS — E119 Type 2 diabetes mellitus without complications: Secondary | ICD-10-CM | POA: Insufficient documentation

## 2016-02-26 DIAGNOSIS — Z7982 Long term (current) use of aspirin: Secondary | ICD-10-CM | POA: Diagnosis not present

## 2016-02-26 DIAGNOSIS — J45909 Unspecified asthma, uncomplicated: Secondary | ICD-10-CM | POA: Insufficient documentation

## 2016-02-26 DIAGNOSIS — R1032 Left lower quadrant pain: Secondary | ICD-10-CM | POA: Diagnosis present

## 2016-02-26 DIAGNOSIS — F17200 Nicotine dependence, unspecified, uncomplicated: Secondary | ICD-10-CM | POA: Diagnosis not present

## 2016-02-26 LAB — CBC
HCT: 42.5 % (ref 40.0–52.0)
Hemoglobin: 14.1 g/dL (ref 13.0–18.0)
MCH: 26.8 pg (ref 26.0–34.0)
MCHC: 33.3 g/dL (ref 32.0–36.0)
MCV: 80.6 fL (ref 80.0–100.0)
PLATELETS: 245 10*3/uL (ref 150–440)
RBC: 5.27 MIL/uL (ref 4.40–5.90)
RDW: 14.5 % (ref 11.5–14.5)
WBC: 10.5 10*3/uL (ref 3.8–10.6)

## 2016-02-26 LAB — COMPREHENSIVE METABOLIC PANEL
ALBUMIN: 3.9 g/dL (ref 3.5–5.0)
ALK PHOS: 75 U/L (ref 38–126)
ALT: 23 U/L (ref 17–63)
AST: 22 U/L (ref 15–41)
Anion gap: 8 (ref 5–15)
BILIRUBIN TOTAL: 0.2 mg/dL — AB (ref 0.3–1.2)
BUN: 14 mg/dL (ref 6–20)
CALCIUM: 9.8 mg/dL (ref 8.9–10.3)
CO2: 30 mmol/L (ref 22–32)
CREATININE: 0.83 mg/dL (ref 0.61–1.24)
Chloride: 96 mmol/L — ABNORMAL LOW (ref 101–111)
GFR calc Af Amer: >60 mL/min (ref >60)
GFR calc non Af Amer: >60 mL/min (ref >60)
GLUCOSE: 267 mg/dL — AB (ref 65–99)
Potassium: 4.1 mmol/L (ref 3.5–5.1)
SODIUM: 134 mmol/L — AB (ref 135–145)
TOTAL PROTEIN: 7.1 g/dL (ref 6.5–8.1)

## 2016-02-26 LAB — URINALYSIS, COMPLETE (UACMP) WITH MICROSCOPIC
Bacteria, UA: NONE SEEN
Bilirubin Urine: NEGATIVE
Glucose, UA: >=500 mg/dL — AB
Hgb urine dipstick: NEGATIVE
Ketones, ur: 5 mg/dL — AB
NITRITE: NEGATIVE
PH: 5 (ref 5.0–8.0)
Protein, ur: 100 mg/dL — AB
SPECIFIC GRAVITY, URINE: 1.032 — AB (ref 1.005–1.030)

## 2016-02-26 LAB — LIPASE, BLOOD: Lipase: 31 U/L (ref 11–51)

## 2016-02-26 MED ORDER — PROMETHAZINE HCL 25 MG/ML IJ SOLN
12.5000 mg | Freq: Once | INTRAMUSCULAR | Status: AC
  Administered 2016-02-26: 12.5 mg via INTRAVENOUS
  Filled 2016-02-26: qty 1

## 2016-02-26 MED ORDER — IOPAMIDOL (ISOVUE-300) INJECTION 61%
100.0000 mL | Freq: Once | INTRAVENOUS | Status: AC | PRN
  Administered 2016-02-26: 100 mL via INTRAVENOUS

## 2016-02-26 MED ORDER — DICYCLOMINE HCL 10 MG PO CAPS
10.0000 mg | ORAL_CAPSULE | Freq: Once | ORAL | Status: AC
  Administered 2016-02-26: 10 mg via ORAL
  Filled 2016-02-26: qty 1

## 2016-02-26 MED ORDER — HYDROMORPHONE HCL 4 MG PO TABS: 4.0000 mg | ORAL_TABLET | ORAL | 0 refills | Status: DC | PRN

## 2016-02-26 MED ORDER — HYDROMORPHONE HCL 1 MG/ML IJ SOLN
1.0000 mg | INTRAMUSCULAR | Status: DC | PRN
  Administered 2016-02-26: 1 mg via INTRAVENOUS
  Filled 2016-02-26: qty 1

## 2016-02-26 MED ORDER — SODIUM CHLORIDE 0.9 % IV BOLUS (SEPSIS)
1000.0000 mL | Freq: Once | INTRAVENOUS | Status: AC
  Administered 2016-02-26: 1000 mL via INTRAVENOUS

## 2016-02-26 NOTE — ED Triage Notes (Signed)
Patient arrives with c/o lower left abd pain. 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   Patient states he attempted self treatment with Tramadol, Hydromorphone, "muscle relaxers", and "Medicine for cramping".  None of which worked.

## 2016-02-26 NOTE — Discharge Instructions (Signed)

## 2016-02-26 NOTE — ED Provider Notes (Signed)
[facility] Emergency Department Provider Note    First MD Initiated Contact with Patient 02/26/16 1702     (approximate)  I have reviewed the triage vital signs and the nursing notes.   HISTORY  Chief Complaint Abdominal Pain    HPI Cory Campbell is a 36 y.o. male with complex past medical history including multiple gastric surgeries with colostomy presenting with acute left lower abdomen pain that is been ongoing over the past 2-3 days. Patient says that he's been trying to self medicate with tramadol, Dilaudid, muscle relaxants and Bentyl without any improvement. No fevers. No change in the character of his stool. Denies any blood in his bowels. He has been tolerating oral hydration and medications without vomiting. Denies any chest pain or shortness of breath.   Past Medical History:  Diagnosis Date  . Abrasion or friction burn of foot and toe(s), without mention of infection   . Acid reflux   . Adopted   . Allergic rhinitis, cause unspecified   . Anxiety   . Arthritis   . Arthrogryposis   . Asthma   . Bladder wall thickening   . BPH (benign prostatic hyperplasia)   . Chest pain, unspecified   . Chronic pain syndrome   . Depressive disorder, not elsewhere classified   . Diverticulitis of colon (without mention of hemorrhage)(562.11)   . HTN (hypertension)   . Hyperlipidemia   . Hypogonadism in male   . Myalgia and myositis, unspecified   . OAB (overactive bladder)   . Obesity   . Other and unspecified noninfectious gastroenteritis and colitis(558.9)   . Other convulsions   . Other psoriasis   . 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 Premature baby    born premature  . Sleep apnea   . 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 or other cause, without mention of thyrotoxic crisis or storm    hyperthyroidism  . TIA (transient ischemic attack)   . Type II or unspecified type diabetes mellitus with neurological  manifestations, not stated as uncontrolled(250.60)    Family History  Problem Relation Age of Onset  . Family history unknown: Yes   Past Surgical History:  Procedure Laterality Date  . APPENDECTOMY    . CORNEAL TRANSPLANT    . feet surgery     both  . HAND SURGERY     left and right  . leg surgery     left and right  . ORTHOPEDIC SURGERY     hands, feet, knees, legs  . tubes in ears     both   Patient Active Problem List   Diagnosis Date Noted  . Shortness of breath 06/23/2015  . Obesity 05/18/2014  . Frequent falls 08/12/2013  . Pain of left clavicle 08/12/2013  . Tachycardia 01/21/2013  . Obstructive sleep apnea on CPAP 06/07/2012  . Chest pain 06/20/2011  . Hypertension 06/20/2011  . Diabetes mellitus (HCC) 06/20/2011      Prior to Admission medications   Medication Sig Start Date End Date Taking? Authorizing Provider  albuterol (PROVENTIL HFA;VENTOLIN HFA) 108 (90 BASE) MCG/ACT inhaler Inhale 2 puffs into the lungs every 4 (four) hours as needed for wheezing or shortness of breath.     Historical Provider, MD  albuterol (PROVENTIL) (2.5 MG/3ML) 0.083% nebulizer solution Take 2.5 mg by nebulization every 4 (four) hours as needed for wheezing or shortness of breath.    Historical Provider, MD  alfuzosin (UROXATRAL) 10 MG 24 hr tablet Take 10 mg  by mouth daily.    Historical Provider, MD  amLODipine (NORVASC) 10 MG tablet Take 10 mg by mouth daily.    Historical Provider, MD  aspirin 81 MG tablet Take 81 mg by mouth daily.    Historical Provider, MD  baclofen (LIORESAL) 10 MG tablet Take 10 mg by mouth 3 (three) times daily.    Historical Provider, MD  beclomethasone (QVAR) 80 MCG/ACT inhaler Inhale 2 puffs into the lungs 2 (two) times daily.     Historical Provider, MD  Buprenorphine (BUTRANS) 15 MCG/HR PTWK Place 15 mg onto the skin once a week. Put on Fridays.    Historical Provider, MD  chlorthalidone (HYGROTON) 25 MG tablet Take 25 mg by mouth daily.    Historical  Provider, MD  citalopram (CELEXA) 20 MG tablet Take 20 mg by mouth daily.    Historical Provider, MD  diclofenac sodium (VOLTAREN) 1 % GEL Apply 2-4 g topically 4 (four) times daily as needed. For pain.    Historical Provider, MD  dicyclomine (BENTYL) 20 MG tablet Take 20 mg by mouth every 6 (six) hours.    Historical Provider, MD  DOCOSAHEXAENOIC ACID PO Take by mouth.    Historical Provider, MD  famotidine (PEPCID) 20 MG tablet Take 20 mg by mouth daily. 01/03/16   Historical Provider, MD  finasteride (PROSCAR) 5 MG tablet Take 5 mg by mouth daily.    Historical Provider, MD  fluticasone (FLONASE) 50 MCG/ACT nasal spray Place 2 sprays into the nose daily.    Historical Provider, MD  furosemide (LASIX) 20 MG tablet take 1 tablet by mouth once daily if needed 12/20/15   Antonieta Iba, MD  glucose blood test strip 1 each by Other route as needed. Use as instructed    Historical Provider, MD  hydrALAZINE (APRESOLINE) 100 MG tablet take 1 tablet by mouth three times a day 08/23/15   Antonieta Iba, MD  hydrALAZINE (APRESOLINE) 100 MG tablet take 1 tablet by mouth three times a day 02/08/16   Antonieta Iba, MD  hydrocortisone cream 0.5 % Apply 1 application topically 2 (two) times daily.    Historical Provider, MD  HYDROmorphone (DILAUDID) 4 MG tablet Take 4 mg by mouth every 3 (three) hours as needed. 01/12/16   Historical Provider, MD  Insulin Pen Needle (NOVOFINE) 30G X 8 MM MISC Inject 1 packet into the skin 4 (four) times daily.    Historical Provider, MD  isosorbide mononitrate (IMDUR) 30 MG 24 hr tablet take 1 tablet by mouth twice a day 11/01/15   Antonieta Iba, MD  ketoconazole (NIZORAL) 2 % shampoo Apply topically. 08/27/14   Historical Provider, MD  LANTUS SOLOSTAR 100 UNIT/ML Solostar Pen INJECT 0.5 ML (50 UNITS) UNDER THE SKIN NIGHTLY 11/02/15   Historical Provider, MD  lisinopril (PRINIVIL,ZESTRIL) 40 MG tablet Take 40 mg by mouth daily.    Historical Provider, MD  loratadine  (CLARITIN) 10 MG tablet Take 10 mg by mouth daily.    Historical Provider, MD  metoprolol (LOPRESSOR) 100 MG tablet Take 1.5 tablets (150 mg total) by mouth 2 (two) times daily. Patient taking differently: Take 100 mg by mouth 2 (two) times daily.  08/12/13   Antonieta Iba, MD  miconazole (MICOTIN) 2 % cream Apply topically. 09/13/15 09/12/16  Historical Provider, MD  mirtazapine (REMERON) 45 MG tablet Take 45 mg by mouth at bedtime.    Historical Provider, MD  Multiple Vitamin (MULTIVITAMIN) tablet Take 1 tablet by mouth daily.  Historical Provider, MD  naloxone [facility]) nasal spray 4 mg/0.1 mL One spray in either nostril once for known/suspected opioid overdose. May repeat every 2-3 minutes in alternating nostril til EMS arrives 01/12/16   Historical Provider, MD  nitroGLYCERIN (NITROSTAT) 0.4 MG SL tablet Place 1 tablet (0.4 mg total) under the tongue every 5 (five) minutes as needed for chest pain. Call 911 if chest is not resolved w/ 3 tabs. 11/02/14   Antonieta Iba, MD  nortriptyline (PAMELOR) 25 MG capsule Take 50 mg by mouth at bedtime.    Historical Provider, MD  NOVOLOG FLEXPEN 100 UNIT/ML FlexPen 10 Units 3 (three) times daily. 12/13/15   Historical Provider, MD  nystatin (MYCOSTATIN) powder Apply topically 3 (three) times daily. For 10 days 05/19/15   Renford Dills, NP  ondansetron (ZOFRAN-ODT) 4 MG disintegrating tablet Take 4 mg by mouth every 8 (eight) hours as needed for nausea or vomiting.    Historical Provider, MD  polyethylene glycol (MIRALAX / GLYCOLAX) packet Take 17 g by mouth daily.    Historical Provider, MD  potassium chloride SA (K-DUR,KLOR-CON) 20 MEQ tablet take 1 tablet by mouth once daily if needed 02/08/16   Antonieta Iba, MD  potassium chloride SA (K-DUR,KLOR-CON) 20 MEQ tablet take 1 tablet by mouth once daily if needed 02/09/16   Antonieta Iba, MD  pravastatin (PRAVACHOL) 80 MG tablet take 1 tablet by mouth at bedtime 12/20/15   Antonieta Iba, MD  prednisoLONE  acetate (PRED FORTE) 1 % ophthalmic suspension Place 1 drop into the right eye 4 (four) times daily.    Historical Provider, MD  RA MELATONIN 1 MG SUBL 1 mg daily. 12/27/15   Historical Provider, MD  RANEXA 1000 MG SR tablet take 1 tablet by mouth twice a day 02/08/16   Antonieta Iba, MD  solifenacin (VESICARE) 10 MG tablet Take 10 mg by mouth daily.    Historical Provider, MD  spironolactone (ALDACTONE) 25 MG tablet Take 25 mg by mouth daily.    Historical Provider, MD  tamsulosin (FLOMAX) 0.4 MG CAPS Take 0.4 mg by mouth daily.    Historical Provider, MD  tiZANidine (ZANAFLEX) 4 MG capsule Take 4 mg by mouth. 01/12/16 01/11/17  Historical Provider, MD  traMADol (ULTRAM) 50 MG tablet Take 50 mg by mouth every 6 (six) hours as needed for moderate pain or severe pain.    Historical Provider, MD  traMADol (ULTRAM) 50 MG tablet Take 50 mg by mouth every 6 (six) hours as needed. 01/12/16   Historical Provider, MD  traZODone (DESYREL) 50 MG tablet Takes 1/2- 2 tablets at bedtime.    Historical Provider, MD  zolpidem (AMBIEN) 10 MG tablet Take 10 mg by mouth at bedtime.    Historical Provider, MD    Allergies Penicillins; Erythromycin base; and Erythromycin base    Social History Social History  Substance Use Topics  . Smoking status: Current Every Day Smoker    Years: 0.00  . Smokeless tobacco: Never Used     Comment: qiut in   . Alcohol use No     Comment: used to be moderate    Review of Systems Patient denies headaches, rhinorrhea, blurry vision, numbness, shortness of breath, chest pain, edema, cough, abdominal pain, nausea, vomiting, diarrhea, dysuria, fevers, rashes or hallucinations unless otherwise stated above in HPI. ____________________________________________   PHYSICAL EXAM:  VITAL SIGNS: Vitals:   02/26/16 2030 02/26/16 2113  BP: 124/79 126/83  Pulse: 69 64  Resp:  16  Temp:      Constitutional: Alert and oriented. Chronically ill appearing but in no acute  distress. Eyes: Conjunctivae are normal. PERRL. EOMI. Head: Atraumatic. Nose: No congestion/rhinnorhea. Mouth/Throat: Mucous membranes are moist.  Oropharynx non-erythematous. Neck: No stridor. Painless ROM. No cervical spine tenderness to palpation Hematological/Lymphatic/Immunilogical: No cervical lymphadenopathy. Cardiovascular: Normal rate, regular rhythm. Grossly normal heart sounds.  Good peripheral circulation. Respiratory: Normal respiratory effort.  No retractions. Lungs CTAB. Gastrointestinal: Left lower quadrant mild tenderness to palpation around stoma but the stoma itself appears pink and well perfused. No stenosis. No peritonitis. Normal active bowel sounds. Genitourinary:  Musculoskeletal:  No joint effusions. Neurologic:  No gross focal neurologic deficits are appreciated Skin:  Skin is warm, dry and intact. No rash noted.  ____________________________________________   LABS (all labs ordered are listed, but only abnormal results are displayed)  Results for orders placed or performed during the hospital encounter of 02/26/16 (from the past 24 hour(s))  Lipase, blood     Status: None   Collection Time: 02/26/16  5:19 PM  Result Value Ref Range   Lipase 31 11 - 51 U/L  Comprehensive metabolic panel     Status: Abnormal   Collection Time: 02/26/16  5:19 PM  Result Value Ref Range   Sodium 134 (L) 135 - 145 mmol/L   Potassium 4.1 3.5 - 5.1 mmol/L   Chloride 96 (L) 101 - 111 mmol/L   CO2 30 22 - 32 mmol/L   Glucose, Bld 267 (H) 65 - 99 mg/dL   BUN 14 6 - 20 mg/dL   Creatinine, Ser 1.610.83 0.61 - 1.24 mg/dL   Calcium 9.8 8.9 - 09.610.3 mg/dL   Total Protein 7.1 6.5 - 8.1 g/dL   Albumin 3.9 3.5 - 5.0 g/dL   AST 22 15 - 41 U/L   ALT 23 17 - 63 U/L   Alkaline Phosphatase 75 38 - 126 U/L   Total Bilirubin 0.2 (L) 0.3 - 1.2 mg/dL   GFR calc non Af Amer >60 >60 mL/min   GFR calc Af Amer >60 >60 mL/min   Anion gap 8 5 - 15  CBC     Status: None   Collection Time: 02/26/16   5:19 PM  Result Value Ref Range   WBC 10.5 3.8 - 10.6 K/uL   RBC 5.27 4.40 - 5.90 MIL/uL   Hemoglobin 14.1 13.0 - 18.0 g/dL   HCT 04.542.5 40.940.0 - 81.152.0 %   MCV 80.6 80.0 - 100.0 fL   MCH 26.8 26.0 - 34.0 pg   MCHC 33.3 32.0 - 36.0 g/dL   RDW 91.414.5 78.211.5 - 95.614.5 %   Platelets 245 150 - 440 K/uL  Urinalysis, Complete w Microscopic     Status: Abnormal   Collection Time: 02/26/16  7:13 PM  Result Value Ref Range   Color, Urine AMBER (A) YELLOW   APPearance HAZY (A) CLEAR   Specific Gravity, Urine 1.032 (H) 1.005 - 1.030   pH 5.0 5.0 - 8.0   Glucose, UA >=500 (A) NEGATIVE mg/dL   Hgb urine dipstick NEGATIVE NEGATIVE   Bilirubin Urine NEGATIVE NEGATIVE   Ketones, ur 5 (A) NEGATIVE mg/dL   Protein, ur 213100 (A) NEGATIVE mg/dL   Nitrite NEGATIVE NEGATIVE   Leukocytes, UA TRACE (A) NEGATIVE   RBC / HPF 6-30 0 - 5 RBC/hpf   WBC, UA 6-30 0 - 5 WBC/hpf   Bacteria, UA NONE SEEN NONE SEEN   Squamous Epithelial / LPF 6-30 (A) NONE SEEN  Mucous PRESENT    Hyaline Casts, UA PRESENT    ____________________________________________  EKG ____________________________________________  RADIOLOGY  I personally reviewed all radiographic images ordered to evaluate for the above acute complaints and reviewed radiology reports and findings.  These findings were personally discussed with the patient.  Please see medical record for radiology report.  ____________________________________________   PROCEDURES  Procedure(s) performed:  Procedures    Critical Care performed: no ____________________________________________   INITIAL IMPRESSION / ASSESSMENT AND PLAN / ED COURSE  Pertinent labs & imaging results that were available during my care of the patient were reviewed by me and considered in my medical decision making (see chart for details).  DDX: diverticulitis, asbce,ss sbo, ileus, medication effect, withdrawal, stone  KACPER CARTLIDGE is a 36 y.o. who presents to the ED with Acute  abdominal pain.  Patient is AFVSS in ED. Exam as above. Given current presentation have considered the above differential. Patient not acutely toxic. Based on his history of multiple surgeries with acute left lower quadrant pain will order CT imaging to evaluate for any. Operative complication, abscess or obstruction. Will provide pot pain medication. We'll provide IV fluids.  The patient will be placed on continuous pulse oximetry and telemetry for monitoring.  Laboratory evaluation will be sent to evaluate for the above complaints.     Clinical Course as of Feb 26 2151  Sat Feb 26, 2016  2047 Patient reassessed and pain-free. CT imaging reviewed with patient. Repeat abdominal exam is soft and benign. I do feel the patient's acute pain is likely secondary to running out of his home medications. We'll provide short duration to get the patient comfortable until Monday when he contact his primary doctor.  Have discussed with the patient and available family all diagnostics and treatments performed thus far and all questions were answered to the best of my ability. The patient demonstrates understanding and agreement with plan.   [PR]    Clinical Course User Index [PR] Willy Eddy, MD     ____________________________________________   FINAL CLINICAL IMPRESSION(S) / ED DIAGNOSES  Final diagnoses:  Left lower quadrant pain      NEW MEDICATIONS STARTED DURING THIS VISIT:  New Prescriptions   No medications on file     Note:  This document was prepared using Dragon voice recognition software and may include unintentional dictation errors.    Willy Eddy, MD 02/26/16 2154

## 2016-04-08 ENCOUNTER — Other Ambulatory Visit: Payer: Self-pay | Admitting: Cardiovascular Disease

## 2016-04-16 ENCOUNTER — Other Ambulatory Visit: Payer: Self-pay | Admitting: Cardiovascular Disease

## 2016-05-09 ENCOUNTER — Ambulatory Visit
Admission: EM | Admit: 2016-05-09 | Discharge: 2016-05-09 | Disposition: A | Discharge status: Home / Self Care | Payer: Medicaid Other | Attending: Family Medicine | Admitting: Family Medicine

## 2016-05-09 ENCOUNTER — Other Ambulatory Visit: Payer: Self-pay | Admitting: Cardiovascular Disease

## 2016-05-09 DIAGNOSIS — R05 Cough: Secondary | ICD-10-CM

## 2016-05-09 DIAGNOSIS — N481 Balanitis: Secondary | ICD-10-CM

## 2016-05-09 DIAGNOSIS — R369 Urethral discharge, unspecified: Secondary | ICD-10-CM | POA: Diagnosis present

## 2016-05-09 DIAGNOSIS — R059 Cough, unspecified: Secondary | ICD-10-CM

## 2016-05-09 LAB — URINALYSIS, COMPLETE (UACMP) WITH MICROSCOPIC
BILIRUBIN URINE: NEGATIVE
Glucose, UA: NEGATIVE mg/dL
Hgb urine dipstick: NEGATIVE
KETONES UR: NEGATIVE mg/dL
LEUKOCYTES UA: NEGATIVE
NITRITE: NEGATIVE
Protein, ur: NEGATIVE mg/dL
Specific Gravity, Urine: 1.015 (ref 1.005–1.030)
pH: 5.5 (ref 5.0–8.0)

## 2016-05-09 LAB — CHLAMYDIA/NGC RT PCR (ARMC ONLY)
CHLAMYDIA TR: NOT DETECTED
N gonorrhoeae: NOT DETECTED

## 2016-05-09 MED ORDER — METRONIDAZOLE 500 MG PO TABS: 500.0000 mg | ORAL_TABLET | Freq: Two times a day (BID) | ORAL | 0 refills | Status: DC

## 2016-05-09 MED ORDER — CEPHALEXIN 500 MG PO CAPS: 500.0000 mg | ORAL_CAPSULE | Freq: Three times a day (TID) | ORAL | 0 refills | Status: DC

## 2016-05-09 MED ORDER — FLUCONAZOLE 150 MG PO TABS: 150.0000 mg | ORAL_TABLET | Freq: Every day | ORAL | 0 refills | Status: DC

## 2016-05-09 NOTE — ED Triage Notes (Signed)
Pt c/o cough and congestion for about 4 days, Chest tightness, phlegm is dark in color. Burning with urination. 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

## 2016-05-09 NOTE — ED Provider Notes (Signed)
[facility]    CSN: [id number] Arrival date & time: 05/09/16  1527     History   Chief Complaint Chief Complaint  Patient presents with  . Cough  . SEXUALLY TRANSMITTED DISEASE    HPI Cory Campbell is a 36 y.o. male.   36 yo male with a c/o drainage around the head of the penis. States he recently had a flare up of his genital herpes and has been taking acyclovir prescribed by his PCP. However states he's noticed white, thick discharge accumulating around the head of the penis.    The history is provided by the patient.  URI  Presenting symptoms: congestion, cough and rhinorrhea   Severity:  Moderate Onset quality:  Sudden Duration:  5 days Timing:  Constant Progression:  Worsening Chronicity:  New Relieved by:  None tried Ineffective treatments:  None tried Associated symptoms: no headaches and no wheezing   Risk factors: not elderly, no chronic cardiac disease, no chronic kidney disease, no chronic respiratory disease, no diabetes mellitus, no immunosuppression, no recent illness, no recent travel and no sick contacts     Past Medical History:  Diagnosis Date  . Abrasion or friction burn of foot and toe(s), without mention of infection   . Acid reflux   . Adopted   . Allergic rhinitis, cause unspecified   . Anxiety   . Arthritis   . Arthrogryposis   . Asthma   . Bladder wall thickening   . BPH (benign prostatic hyperplasia)   . Chest pain, unspecified   . Chronic pain syndrome   . Depressive disorder, not elsewhere classified   . Diverticulitis of colon (without mention of hemorrhage)(562.11)   . HTN (hypertension)   . Hyperlipidemia   . Hypogonadism in male   . Myalgia and myositis, unspecified   . OAB (overactive bladder)   . Obesity   . Other and unspecified noninfectious gastroenteritis and colitis(558.9)   . Other convulsions   . Other psoriasis   . 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 Premature baby    born  premature  . Sleep apnea   . 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 or other cause, without mention of thyrotoxic crisis or storm    hyperthyroidism  . TIA (transient ischemic attack)   . Type II or unspecified type diabetes mellitus with neurological manifestations, not stated as uncontrolled(250.60)     Patient Active Problem List   Diagnosis Date Noted  . Shortness of breath 06/23/2015  . Obesity 05/18/2014  . Frequent falls 08/12/2013  . Pain of left clavicle 08/12/2013  . Tachycardia 01/21/2013  . Obstructive sleep apnea on CPAP 06/07/2012  . Chest pain 06/20/2011  . Hypertension 06/20/2011  . Diabetes mellitus (HCC) 06/20/2011    Past Surgical History:  Procedure Laterality Date  . APPENDECTOMY    . CORNEAL TRANSPLANT    . feet surgery     both  . HAND SURGERY     left and right  . leg surgery     left and right  . ORTHOPEDIC SURGERY     hands, feet, knees, legs  . tubes in ears     both       Home Medications    Prior to Admission medications   Medication Sig Start Date End Date Taking? Authorizing Provider  albuterol (PROVENTIL HFA;VENTOLIN HFA) 108 (90 BASE) MCG/ACT inhaler Inhale 2 puffs into the lungs every 4 (four) hours as needed for wheezing or shortness of breath.  Yes Historical Provider, MD  albuterol (PROVENTIL) (2.5 MG/3ML) 0.083% nebulizer solution Take 2.5 mg by nebulization every 4 (four) hours as needed for wheezing or shortness of breath.   Yes Historical Provider, MD  metoprolol (LOPRESSOR) 100 MG tablet Take 1.5 tablets (150 mg total) by mouth 2 (two) times daily. Patient taking differently: Take 100 mg by mouth 2 (two) times daily.  08/12/13  Yes Antonieta Iba, MD  alfuzosin (UROXATRAL) 10 MG 24 hr tablet Take 10 mg by mouth daily.    Historical Provider, MD  amLODipine (NORVASC) 10 MG tablet Take 10 mg by mouth daily.    Historical Provider, MD  aspirin 81 MG tablet Take 81 mg by mouth daily.    Historical Provider, MD  baclofen  (LIORESAL) 10 MG tablet Take 10 mg by mouth 3 (three) times daily.    Historical Provider, MD  beclomethasone (QVAR) 80 MCG/ACT inhaler Inhale 2 puffs into the lungs 2 (two) times daily.     Historical Provider, MD  Buprenorphine (BUTRANS) 15 MCG/HR PTWK Place 15 mg onto the skin once a week. Put on Fridays.    Historical Provider, MD  cephALEXin (KEFLEX) 500 MG capsule Take 1 capsule (500 mg total) by mouth 3 (three) times daily. 05/09/16   [name], MD  chlorthalidone (HYGROTON) 25 MG tablet Take 25 mg by mouth daily.    Historical Provider, MD  citalopram (CELEXA) 20 MG tablet Take 20 mg by mouth daily.    Historical Provider, MD  diclofenac sodium (VOLTAREN) 1 % GEL Apply 2-4 g topically 4 (four) times daily as needed. For pain.    Historical Provider, MD  dicyclomine (BENTYL) 20 MG tablet Take 20 mg by mouth every 6 (six) hours.    Historical Provider, MD  DOCOSAHEXAENOIC ACID PO Take by mouth.    Historical Provider, MD  famotidine (PEPCID) 20 MG tablet Take 20 mg by mouth daily. 01/03/16   Historical Provider, MD  finasteride (PROSCAR) 5 MG tablet Take 5 mg by mouth daily.    Historical Provider, MD  fluconazole (DIFLUCAN) 150 MG tablet Take 1 tablet (150 mg total) by mouth daily. 05/09/16   [name], MD  fluticasone (FLONASE) 50 MCG/ACT nasal spray Place 2 sprays into the nose daily.    Historical Provider, MD  furosemide (LASIX) 20 MG tablet take 1 tablet by mouth once daily if needed 12/20/15   Antonieta Iba, MD  glucose blood test strip 1 each by Other route as needed. Use as instructed    Historical Provider, MD  hydrALAZINE (APRESOLINE) 100 MG tablet take 1 tablet by mouth three times a day 08/23/15   Antonieta Iba, MD  hydrALAZINE (APRESOLINE) 100 MG tablet take 1 tablet by mouth three times a day 02/08/16   Antonieta Iba, MD  hydrocortisone cream 0.5 % Apply 1 application topically 2 (two) times daily.    Historical Provider, MD  HYDROmorphone (DILAUDID) 4 MG tablet Take  1 tablet (4 mg total) by mouth every 3 (three) hours as needed. 02/26/16   Willy Eddy, MD  Insulin Pen Needle (NOVOFINE) 30G X 8 MM MISC Inject 1 packet into the skin 4 (four) times daily.    Historical Provider, MD  isosorbide mononitrate (IMDUR) 30 MG 24 hr tablet take 1 tablet by mouth twice a day 04/10/16   Antonieta Iba, MD  ketoconazole (NIZORAL) 2 % shampoo Apply topically. 08/27/14   Historical Provider, MD  LANTUS SOLOSTAR 100 UNIT/ML Solostar Pen INJECT 0.5 ML (  50 UNITS) UNDER THE SKIN NIGHTLY 11/02/15   Historical Provider, MD  lisinopril (PRINIVIL,ZESTRIL) 40 MG tablet Take 40 mg by mouth daily.    Historical Provider, MD  loratadine (CLARITIN) 10 MG tablet Take 10 mg by mouth daily.    Historical Provider, MD  metroNIDAZOLE (FLAGYL) 500 MG tablet Take 1 tablet (500 mg total) by mouth 2 (two) times daily. 05/09/16   [name], MD  miconazole (MICOTIN) 2 % cream Apply topically. 09/13/15 09/12/16  Historical Provider, MD  mirtazapine (REMERON) 45 MG tablet Take 45 mg by mouth at bedtime.    Historical Provider, MD  Multiple Vitamin (MULTIVITAMIN) tablet Take 1 tablet by mouth daily.    Historical Provider, MD  naloxone [facility]) nasal spray 4 mg/0.1 mL One spray in either nostril once for known/suspected opioid overdose. May repeat every 2-3 minutes in alternating nostril til EMS arrives 01/12/16   Historical Provider, MD  nitroGLYCERIN (NITROSTAT) 0.4 MG SL tablet Place 1 tablet (0.4 mg total) under the tongue every 5 (five) minutes as needed for chest pain. Call 911 if chest is not resolved w/ 3 tabs. 11/02/14   Antonieta Iba, MD  nortriptyline (PAMELOR) 25 MG capsule Take 50 mg by mouth at bedtime.    Historical Provider, MD  NOVOLOG FLEXPEN 100 UNIT/ML FlexPen 10 Units 3 (three) times daily. 12/13/15   Historical Provider, MD  nystatin (MYCOSTATIN) powder Apply topically 3 (three) times daily. For 10 days 05/19/15   Renford Dills, NP  ondansetron (ZOFRAN-ODT) 4 MG disintegrating  tablet Take 4 mg by mouth every 8 (eight) hours as needed for nausea or vomiting.    Historical Provider, MD  polyethylene glycol (MIRALAX / GLYCOLAX) packet Take 17 g by mouth daily.    Historical Provider, MD  potassium chloride SA (K-DUR,KLOR-CON) 20 MEQ tablet take 1 tablet by mouth once daily if needed 02/08/16   Antonieta Iba, MD  potassium chloride SA (K-DUR,KLOR-CON) 20 MEQ tablet take 1 tablet by mouth once daily if needed 02/09/16   Antonieta Iba, MD  pravastatin (PRAVACHOL) 80 MG tablet take 1 tablet by mouth at bedtime 04/17/16   Antonieta Iba, MD  prednisoLONE acetate (PRED FORTE) 1 % ophthalmic suspension Place 1 drop into the right eye 4 (four) times daily.    Historical Provider, MD  RA MELATONIN 1 MG SUBL 1 mg daily. 12/27/15   Historical Provider, MD  RANEXA 1000 MG SR tablet take 1 tablet by mouth twice a day 05/09/16   Antonieta Iba, MD  solifenacin (VESICARE) 10 MG tablet Take 10 mg by mouth daily.    Historical Provider, MD  spironolactone (ALDACTONE) 25 MG tablet Take 25 mg by mouth daily.    Historical Provider, MD  tamsulosin (FLOMAX) 0.4 MG CAPS Take 0.4 mg by mouth daily.    Historical Provider, MD  tiZANidine (ZANAFLEX) 4 MG capsule Take 4 mg by mouth. 01/12/16 01/11/17  Historical Provider, MD  traMADol (ULTRAM) 50 MG tablet Take 50 mg by mouth every 6 (six) hours as needed for moderate pain or severe pain.    Historical Provider, MD  traMADol (ULTRAM) 50 MG tablet Take 50 mg by mouth every 6 (six) hours as needed. 01/12/16   Historical Provider, MD  traZODone (DESYREL) 50 MG tablet Takes 1/2- 2 tablets at bedtime.    Historical Provider, MD  zolpidem (AMBIEN) 10 MG tablet Take 10 mg by mouth at bedtime.    Historical Provider, MD    Family History Family History  Problem Relation Age of Onset  . Family history unknown: Yes    Social History Social History  Substance Use Topics  . Smoking status: Current Every Day Smoker    Years: 0.00  . Smokeless tobacco:  Never Used     Comment: qiut in   . Alcohol use No     Comment: used to be moderate     Allergies   Penicillins; Erythromycin base; and Erythromycin base   Review of Systems Review of Systems  HENT: Positive for congestion and rhinorrhea.   Respiratory: Positive for cough. Negative for wheezing.   Neurological: Negative for headaches.     Physical Exam Triage Vital Signs ED Triage Vitals  Enc Vitals Group     BP 05/09/16 1620 (!) 157/107     Pulse Rate 05/09/16 1620 (!) 105     Resp 05/09/16 1620 18     Temp 05/09/16 1620 98.4 F (36.9 C)     Temp Source 05/09/16 1620 Oral     SpO2 05/09/16 1620 100 %     Weight 05/09/16 1619 175 lb (79.4 kg)     Height 05/09/16 1619  (1.6 m)     Head Circumference --      Peak Flow --      Pain Score 05/09/16 1620 5     Pain Loc --      Pain Edu? --      Excl. in GC? --    No data found.   Updated Vital Signs BP (!) 157/107 (BP Location: Left Arm)   Pulse (!) 105   Temp 98.4 F (36.9 C) (Oral)   Resp 18   Ht  (1.6 m)   Wt 175 lb (79.4 kg)   SpO2 100%   BMI 31.00 kg/m   Visual Acuity Right Eye Distance:   Left Eye Distance:   Bilateral Distance:    Right Eye Near:   Left Eye Near:    Bilateral Near:     Physical Exam  Constitutional: He appears well-developed and well-nourished. No distress.  HENT:  Head: Normocephalic and atraumatic.  Nose: Nose normal.  Mouth/Throat: Uvula is midline, oropharynx is clear and moist and mucous membranes are normal. No oropharyngeal exudate or tonsillar abscesses.  Eyes: Right eye exhibits no discharge. Left eye exhibits no discharge. No scleral icterus.  Neck: Normal range of motion. Neck supple. No tracheal deviation present. No thyromegaly present.  Cardiovascular: Normal rate, regular rhythm and normal heart sounds.   Pulmonary/Chest: Effort normal and breath sounds normal. No stridor. No respiratory distress. He has no wheezes. He has no rales. He exhibits no  tenderness.  Genitourinary: Testes normal. Uncircumcised. Penile erythema present.  Genitourinary Comments: Erythema and drainage on the skin around the head of the penis  Lymphadenopathy:    He has no cervical adenopathy.  Neurological: He is alert.  Skin: Skin is warm and dry. No rash noted. He is not diaphoretic.  Nursing note and vitals reviewed.    UC Treatments / Results  Labs (all labs ordered are listed, but only abnormal results are displayed) Labs Reviewed  URINALYSIS, COMPLETE (UACMP) WITH MICROSCOPIC - Abnormal; Notable for the following:       Result Value   Squamous Epithelial / LPF 0-5 (*)    Bacteria, UA RARE (*)    All other components within normal limits  CHLAMYDIA/NGC RT PCR [facility] ONLY)    EKG  EKG Interpretation None       Radiology No  results found.  Procedures Procedures (including critical care time)  Medications Ordered in UC Medications - No data to display   Initial Impression / Assessment and Plan / UC Course  I have reviewed the triage vital signs and the nursing notes.  Pertinent labs & imaging results that were available during my care of the patient were reviewed by me and considered in my medical decision making (see chart for details).       Final Clinical Impressions(s) / UC Diagnoses   Final diagnoses:  Balanitis  Cough    New Prescriptions Discharge Medication List as of 05/09/2016  4:58 PM    START taking these medications   Details  cephALEXin (KEFLEX) 500 MG capsule Take 1 capsule (500 mg total) by mouth 3 (three) times daily., Starting Tue 05/09/2016, Normal    fluconazole (DIFLUCAN) 150 MG tablet Take 1 tablet (150 mg total) by mouth daily., Starting Tue 05/09/2016, Normal    metroNIDAZOLE (FLAGYL) 500 MG tablet Take 1 tablet (500 mg total) by mouth 2 (two) times daily., Starting Tue 05/09/2016, Normal       1. Lab results and diagnosis reviewed with patient 2. rx as per orders above; reviewed possible  side effects, interactions, risks and benefits  3. Recommend supportive treatment with increased fluids, otc analgesics prn 4. Follow-up prn if symptoms worsen or don't improve   [name], MD 05/09/16 1918

## 2016-06-27 ENCOUNTER — Telehealth: Payer: Self-pay | Admitting: Cardiovascular Disease

## 2016-06-27 NOTE — Telephone Encounter (Signed)
[place] [facility] [facility].

## 2016-07-30 ENCOUNTER — Emergency Department: Payer: Medicaid Other

## 2016-07-30 ENCOUNTER — Encounter: Payer: Self-pay | Admitting: Emergency Medicine

## 2016-07-30 ENCOUNTER — Emergency Department
Admission: EM | Admit: 2016-07-30 | Discharge: 2016-07-30 | Disposition: A | Discharge status: Home / Self Care | Payer: Medicaid Other | Attending: Student in an Organized Health Care Education/Training Program | Admitting: Student in an Organized Health Care Education/Training Program

## 2016-07-30 DIAGNOSIS — E0590 Thyrotoxicosis, unspecified without thyrotoxic crisis or storm: Secondary | ICD-10-CM | POA: Insufficient documentation

## 2016-07-30 DIAGNOSIS — Z8673 Personal history of transient ischemic attack (TIA), and cerebral infarction without residual deficits: Secondary | ICD-10-CM | POA: Diagnosis not present

## 2016-07-30 DIAGNOSIS — R079 Chest pain, unspecified: Secondary | ICD-10-CM | POA: Diagnosis present

## 2016-07-30 DIAGNOSIS — Z79899 Other long term (current) drug therapy: Secondary | ICD-10-CM | POA: Diagnosis not present

## 2016-07-30 DIAGNOSIS — Z7982 Long term (current) use of aspirin: Secondary | ICD-10-CM | POA: Insufficient documentation

## 2016-07-30 DIAGNOSIS — I10 Essential (primary) hypertension: Secondary | ICD-10-CM | POA: Diagnosis not present

## 2016-07-30 DIAGNOSIS — E119 Type 2 diabetes mellitus without complications: Secondary | ICD-10-CM | POA: Diagnosis not present

## 2016-07-30 DIAGNOSIS — Z7902 Long term (current) use of antithrombotics/antiplatelets: Secondary | ICD-10-CM | POA: Insufficient documentation

## 2016-07-30 DIAGNOSIS — Z794 Long term (current) use of insulin: Secondary | ICD-10-CM | POA: Diagnosis not present

## 2016-07-30 DIAGNOSIS — F17200 Nicotine dependence, unspecified, uncomplicated: Secondary | ICD-10-CM | POA: Diagnosis not present

## 2016-07-30 DIAGNOSIS — J45909 Unspecified asthma, uncomplicated: Secondary | ICD-10-CM | POA: Diagnosis not present

## 2016-07-30 HISTORY — DX: Type 2 diabetes mellitus without complications: E11.9

## 2016-07-30 LAB — BASIC METABOLIC PANEL
Anion gap: 11 (ref 5–15)
BUN: 15 mg/dL (ref 6–20)
CHLORIDE: 97 mmol/L — AB (ref 101–111)
CO2: 28 mmol/L (ref 22–32)
CREATININE: 0.73 mg/dL (ref 0.61–1.24)
Calcium: 9.1 mg/dL (ref 8.9–10.3)
Glucose, Bld: 159 mg/dL — ABNORMAL HIGH (ref 65–99)
Potassium: 3.4 mmol/L — ABNORMAL LOW (ref 3.5–5.1)
SODIUM: 136 mmol/L (ref 135–145)

## 2016-07-30 LAB — URINALYSIS, COMPLETE (UACMP) WITH MICROSCOPIC
Bacteria, UA: NONE SEEN
Bilirubin Urine: NEGATIVE
GLUCOSE, UA: NEGATIVE mg/dL
Hgb urine dipstick: NEGATIVE
Ketones, ur: 5 mg/dL — AB
Leukocytes, UA: NEGATIVE
NITRITE: NEGATIVE
PH: 6 (ref 5.0–8.0)
Protein, ur: 30 mg/dL — AB
SPECIFIC GRAVITY, URINE: 1.023 (ref 1.005–1.030)

## 2016-07-30 LAB — CBC
HCT: 38.2 % — ABNORMAL LOW (ref 40.0–52.0)
Hemoglobin: 13 g/dL (ref 13.0–18.0)
MCH: 27.8 pg (ref 26.0–34.0)
MCHC: 34 g/dL (ref 32.0–36.0)
MCV: 81.8 fL (ref 80.0–100.0)
PLATELETS: 321 10*3/uL (ref 150–440)
RBC: 4.66 MIL/uL (ref 4.40–5.90)
RDW: 13.8 % (ref 11.5–14.5)
WBC: 7.7 10*3/uL (ref 3.8–10.6)

## 2016-07-30 LAB — TROPONIN I

## 2016-07-30 MED ORDER — MORPHINE SULFATE (PF) 4 MG/ML IV SOLN
4.0000 mg | INTRAVENOUS | Status: DC | PRN
  Administered 2016-07-30: 4 mg via INTRAVENOUS
  Filled 2016-07-30: qty 1

## 2016-07-30 MED ORDER — IOPAMIDOL (ISOVUE-370) INJECTION 76%
75.0000 mL | Freq: Once | INTRAVENOUS | Status: AC | PRN
  Administered 2016-07-30: 75 mL via INTRAVENOUS

## 2016-07-30 MED ORDER — NAPROXEN 375 MG PO TABS: 375.0000 mg | ORAL_TABLET | Freq: Two times a day (BID) | ORAL | 0 refills | Status: AC

## 2016-07-30 MED ORDER — SODIUM CHLORIDE 0.9 % IV BOLUS (SEPSIS)
500.0000 mL | Freq: Once | INTRAVENOUS | Status: AC
  Administered 2016-07-30: 500 mL via INTRAVENOUS

## 2016-07-30 MED ORDER — ONDANSETRON HCL 4 MG/2ML IJ SOLN
4.0000 mg | Freq: Once | INTRAMUSCULAR | Status: AC
  Administered 2016-07-30: 4 mg via INTRAVENOUS
  Filled 2016-07-30: qty 2

## 2016-07-30 MED ORDER — KETOROLAC TROMETHAMINE 30 MG/ML IJ SOLN
15.0000 mg | Freq: Once | INTRAMUSCULAR | Status: AC
  Administered 2016-07-30: 15 mg via INTRAVENOUS
  Filled 2016-07-30: qty 1

## 2016-07-30 NOTE — ED Notes (Signed)
Pt reports that he started with chest pain this am - pain was located over left breast and radiated across his chest - reports shortness of breath - denies dizziness - denies N/V

## 2016-07-30 NOTE — ED Notes (Signed)
Pt given cup of water 

## 2016-07-30 NOTE — ED Notes (Signed)
Secretary notified to call ems for pt transport home 

## 2016-07-30 NOTE — ED Notes (Signed)
Ems via wheelchair , non ambulatory, reproducible chest pain worse with cough, [facility]

## 2016-07-30 NOTE — ED Provider Notes (Signed)
[facility] Emergency Department Provider Note    First MD Initiated Contact with Patient 07/30/16 1654     (approximate)  I have reviewed the triage vital signs and the nursing notes.   HISTORY  Chief Complaint Chest Pain    HPI Cory Campbell is a 36 y.o. male with multiple chronic medical conditions presents with midsternal chest pain is nonradiating and nonexertional this with the patient from sleep around 9 AM this morning. Denies any pain when taking a deep breath. Denies any diaphoresis. No associated nausea. Pain is worse with movement. No recent fevers. Denies any cough productive of any sputum. Denies any pain radiating through to his back. States she's been compliant with his pain medications. States is been to multiple ERs in the past several weeks with different areas of pain including his abdomen and low back. Does not have any pain there today.   Past Medical History:  Diagnosis Date  . Abrasion or friction burn of foot and toe(s), without mention of infection   . Acid reflux   . Adopted   . Allergic rhinitis, cause unspecified   . Anxiety   . Arthritis   . Arthrogryposis   . Asthma   . Bladder wall thickening   . BPH (benign prostatic hyperplasia)   . Chest pain, unspecified   . Chronic pain syndrome   . Depressive disorder, not elsewhere classified   . Diverticulitis of colon (without mention of hemorrhage)(562.11)   . HTN (hypertension)   . Hyperlipidemia   . Hypogonadism in male   . Myalgia and myositis, unspecified   . OAB (overactive bladder)   . Obesity   . Other and unspecified noninfectious gastroenteritis and colitis(558.9)   . Other convulsions   . Other psoriasis   . 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 Premature baby    born premature  . Sleep apnea   . 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 or other cause, without mention of thyrotoxic crisis or storm    hyperthyroidism  . TIA (transient  ischemic attack)   . Type II or unspecified type diabetes mellitus with neurological manifestations, not stated as uncontrolled(250.60)    Family History  Problem Relation Age of Onset  . Family history unknown: Yes   Past Surgical History:  Procedure Laterality Date  . APPENDECTOMY    . CORNEAL TRANSPLANT    . feet surgery     both  . HAND SURGERY     left and right  . leg surgery     left and right  . ORTHOPEDIC SURGERY     hands, feet, knees, legs  . tubes in ears     both   Patient Active Problem List   Diagnosis Date Noted  . Shortness of breath 06/23/2015  . Obesity 05/18/2014  . Frequent falls 08/12/2013  . Pain of left clavicle 08/12/2013  . Tachycardia 01/21/2013  . Obstructive sleep apnea on CPAP 06/07/2012  . Chest pain 06/20/2011  . Hypertension 06/20/2011  . Diabetes mellitus (HCC) 06/20/2011      Prior to Admission medications   Medication Sig Start Date End Date Taking? Authorizing Provider  albuterol (PROVENTIL HFA;VENTOLIN HFA) 108 (90 BASE) MCG/ACT inhaler Inhale 2 puffs into the lungs every 4 (four) hours as needed for wheezing or shortness of breath.     [provider]  albuterol (PROVENTIL) (2.5 MG/3ML) 0.083% nebulizer solution Take 2.5 mg by nebulization every 4 (four) hours as needed for wheezing or shortness  of breath.    [provider]  alfuzosin (UROXATRAL) 10 MG 24 hr tablet Take 10 mg by mouth daily.    [provider]  amLODipine (NORVASC) 10 MG tablet Take 10 mg by mouth daily.    [provider]  aspirin 81 MG tablet Take 81 mg by mouth daily.    [provider]  baclofen (LIORESAL) 10 MG tablet Take 10 mg by mouth 3 (three) times daily.    [provider]  beclomethasone (QVAR) 80 MCG/ACT inhaler Inhale 2 puffs into the lungs 2 (two) times daily.     [provider]  Buprenorphine (BUTRANS) 15 MCG/HR PTWK Place 15 mg onto the skin once a week. Put on Fridays.    [provider]  cephALEXin (KEFLEX) 500 MG capsule Take 1 capsule (500 mg total) by mouth 3 (three) times daily. 05/09/16   [name], MD  chlorthalidone (HYGROTON) 25 MG tablet Take 25 mg by mouth daily.    [provider]  citalopram (CELEXA) 20 MG tablet Take 20 mg by mouth daily.    [provider]  diclofenac sodium (VOLTAREN) 1 % GEL Apply 2-4 g topically 4 (four) times daily as needed. For pain.    [provider]  dicyclomine (BENTYL) 20 MG tablet Take 20 mg by mouth every 6 (six) hours.    [provider]  DOCOSAHEXAENOIC ACID PO Take by mouth.    [provider]  famotidine (PEPCID) 20 MG tablet Take 20 mg by mouth daily. 01/03/16   [provider]  finasteride (PROSCAR) 5 MG tablet Take 5 mg by mouth daily.    [provider]  fluconazole (DIFLUCAN) 150 MG tablet Take 1 tablet (150 mg total) by mouth daily. 05/09/16   [name], MD  fluticasone (FLONASE) 50 MCG/ACT nasal spray Place 2 sprays into the nose daily.    [provider]  furosemide (LASIX) 20 MG tablet take 1 tablet by mouth once daily if needed 12/20/15   Antonieta Iba, MD  glucose blood test strip 1 each by Other route as needed. Use as instructed    [provider]  hydrALAZINE (APRESOLINE) 100 MG tablet take 1 tablet by mouth three times a day 08/23/15   Antonieta Iba, MD  hydrALAZINE (APRESOLINE) 100 MG tablet take 1 tablet by mouth three times a day 02/08/16   Antonieta Iba, MD  hydrocortisone cream 0.5 % Apply 1 application topically 2 (two) times daily.    [provider]  HYDROmorphone (DILAUDID) 4 MG tablet Take 1 tablet (4 mg total) by mouth every 3 (three) hours as needed. 02/26/16   Willy Eddy, MD  Insulin Pen Needle (NOVOFINE) 30G X 8 MM MISC Inject 1 packet into the skin 4 (four) times daily.    [provider]  isosorbide mononitrate (IMDUR) 30 MG 24 hr tablet take 1 tablet by mouth twice a  day 04/10/16   Antonieta Iba, MD  ketoconazole (NIZORAL) 2 % shampoo Apply topically. 08/27/14   [provider]  LANTUS SOLOSTAR 100 UNIT/ML Solostar Pen INJECT 0.5 ML (50 UNITS) UNDER THE SKIN NIGHTLY 11/02/15   [provider]  lisinopril (PRINIVIL,ZESTRIL) 40 MG tablet Take 40 mg by mouth daily.    [provider]  loratadine (CLARITIN) 10 MG tablet Take 10 mg by mouth daily.    [provider]  metoprolol (LOPRESSOR) 100 MG tablet Take 1.5 tablets (150 mg total) by mouth 2 (two) times  daily. Patient taking differently: Take 100 mg by mouth 2 (two) times daily.  08/12/13   Antonieta IbaGollan, Timothy J, MD  metroNIDAZOLE (FLAGYL) 500 MG tablet Take 1 tablet (500 mg total) by mouth 2 (two) times daily. 05/09/16   Payton Mccallumonty, [place], MD  miconazole (MICOTIN) 2 % cream Apply topically. 09/13/15 09/12/16  [provider]  mirtazapine (REMERON) 45 MG tablet Take 45 mg by mouth at bedtime.    [provider]  Multiple Vitamin (MULTIVITAMIN) tablet Take 1 tablet by mouth daily.    [provider]  naloxone [facility](NARCAN) nasal spray 4 mg/0.1 mL One spray in either nostril once for known/suspected opioid overdose. May repeat every 2-3 minutes in alternating nostril til EMS arrives 01/12/16   [provider]  nitroGLYCERIN (NITROSTAT) 0.4 MG SL tablet Place 1 tablet (0.4 mg total) under the tongue every 5 (five) minutes as needed for chest pain. Call 911 if chest is not resolved w/ 3 tabs. 11/02/14   Antonieta IbaGollan, Timothy J, MD  nortriptyline (PAMELOR) 25 MG capsule Take 50 mg by mouth at bedtime.    [provider]  NOVOLOG FLEXPEN 100 UNIT/ML FlexPen 10 Units 3 (three) times daily. 12/13/15   [provider]  nystatin (MYCOSTATIN) powder Apply topically 3 (three) times daily. For 10 days 05/19/15   Renford DillsMiller, Lindsey, NP  ondansetron (ZOFRAN-ODT) 4 MG disintegrating tablet Take 4 mg by mouth every 8 (eight) hours as needed for nausea or vomiting.     [provider]  polyethylene glycol (MIRALAX / GLYCOLAX) packet Take 17 g by mouth daily.    [provider]  potassium chloride SA (K-DUR,KLOR-CON) 20 MEQ tablet take 1 tablet by mouth once daily if needed 02/08/16   Antonieta IbaGollan, Timothy J, MD  potassium chloride SA (K-DUR,KLOR-CON) 20 MEQ tablet take 1 tablet by mouth once daily if needed 02/09/16   Antonieta IbaGollan, Timothy J, MD  pravastatin (PRAVACHOL) 80 MG tablet take 1 tablet by mouth at bedtime 04/17/16   Antonieta IbaGollan, Timothy J, MD  prednisoLONE acetate (PRED FORTE) 1 % ophthalmic suspension Place 1 drop into the right eye 4 (four) times daily.    [provider]  RA MELATONIN 1 MG SUBL 1 mg daily. 12/27/15   [provider]  RANEXA 1000 MG SR tablet take 1 tablet by mouth twice a day 05/09/16   Antonieta IbaGollan, Timothy J, MD  solifenacin (VESICARE) 10 MG tablet Take 10 mg by mouth daily.    [provider]  spironolactone (ALDACTONE) 25 MG tablet Take 25 mg by mouth daily.    [provider]  tamsulosin (FLOMAX) 0.4 MG CAPS Take 0.4 mg by mouth daily.    [provider]  tiZANidine (ZANAFLEX) 4 MG capsule Take 4 mg by mouth. 01/12/16 01/11/17  [provider]  traMADol (ULTRAM) 50 MG tablet Take 50 mg by mouth every 6 (six) hours as needed for moderate pain or severe pain.    [provider]  traMADol (ULTRAM) 50 MG tablet Take 50 mg by mouth every 6 (six) hours as needed. 01/12/16   [provider]  traZODone (DESYREL) 50 MG tablet Takes 1/2- 2 tablets at bedtime.    [provider]  zolpidem (AMBIEN) 10 MG tablet Take 10 mg by mouth at bedtime.    [provider]    Allergies Penicillins; Erythromycin base; and Erythromycin base    Social History Social History  Substance Use Topics  . Smoking status: Current Every Day Smoker    Years: 0.00  .  Smokeless tobacco: Never Used     Comment: qiut in   . Alcohol use No     Comment: used to be moderate     Review of Systems Patient denies headaches, rhinorrhea, blurry vision, numbness, shortness of breath, chest pain, edema, cough, abdominal pain, nausea, vomiting, diarrhea, dysuria, fevers, rashes or hallucinations unless otherwise stated above in HPI. ____________________________________________   PHYSICAL EXAM:  VITAL SIGNS: Vitals:   07/30/16 1314 07/30/16 1630  BP: 94/64 96/64  Pulse: 88 83  Resp: 18 18  Temp: 98.6 F (37 C) 98 F (36.7 C)    Constitutional: Alert and oriented. in no acute distress. Eyes: Conjunctivae are normal.  Head: Atraumatic. Nose: No congestion/rhinnorhea. Mouth/Throat: Mucous membranes are moist.   Neck: No stridor. Painless ROM.  Cardiovascular: Normal rate, regular rhythm. Grossly normal heart sounds.  Good peripheral circulation.  + point ten derness to left anterior chest wall, no bruising, ecchymosis, crepitus Respiratory: Normal respiratory effort.  No retractions. Lungs CTAB. Gastrointestinal: Soft and nontender. No distention. No abdominal bruits. No CVA tenderness. Musculoskeletal: No lower extremity tenderness nor edema.  No joint effusions. Neurologic:  Normal speech and language. No gross focal neurologic deficits are appreciated. No facial droop Skin:  Skin is warm, dry and intact. No rash noted. Psychiatric: Mood and affect are normal. Speech and behavior are normal.  ____________________________________________   LABS (all labs ordered are listed, but only abnormal results are displayed)  Results for orders placed or performed during the hospital encounter of 07/30/16 (from the past 24 hour(s))  Basic metabolic panel     Status: Abnormal   Collection Time: 07/30/16  1:23 PM  Result Value Ref Range   Sodium 136 135 - 145 mmol/L   Potassium 3.4 (L) 3.5 - 5.1 mmol/L   Chloride 97 (L) 101 - 111 mmol/L   CO2 28 22 - 32 mmol/L   Glucose, Bld 159 (H) 65 - 99 mg/dL   BUN 15 6 - 20 mg/dL   Creatinine, Ser 4.09 0.61 - 1.24 mg/dL    Calcium 9.1 8.9 - 81.1 mg/dL   GFR calc non Af Amer >60 >60 mL/min   GFR calc Af Amer >60 >60 mL/min   Anion gap 11 5 - 15  CBC     Status: Abnormal   Collection Time: 07/30/16  1:23 PM  Result Value Ref Range   WBC 7.7 3.8 - 10.6 K/uL   RBC 4.66 4.40 - 5.90 MIL/uL   Hemoglobin 13.0 13.0 - 18.0 g/dL   HCT 91.4 (L) 78.2 - 95.6 %   MCV 81.8 80.0 - 100.0 fL   MCH 27.8 26.0 - 34.0 pg   MCHC 34.0 32.0 - 36.0 g/dL   RDW 21.3 08.6 - 57.8 %   Platelets 321 150 - 440 K/uL  Troponin I     Status: None   Collection Time: 07/30/16  1:23 PM  Result Value Ref Range   Troponin I <0.03 <0.03 ng/mL   ____________________________________________  EKG My review and personal interpretation at Time: 13:10   Indication: chest pain  Rate: 85  Rhythm: sinus Axis: normal Other: normal intervals, no ST elevations, non specific lateral t wave change ____________________________________________  RADIOLOGY  I personally reviewed all radiographic images ordered to evaluate for the above acute complaints and reviewed radiology reports and findings.  These findings were personally discussed with the patient.  Please see medical record for radiology report.  ____________________________________________   PROCEDURES  Procedure(s) performed:  Procedures  Critical Care performed: no ____________________________________________   INITIAL IMPRESSION / ASSESSMENT AND PLAN / ED COURSE  Pertinent labs & imaging results that were available during my care of the patient were reviewed by me and considered in my medical decision making (see chart for details).  DDX: ACS, pericarditis, esophagitis, pna, bronchitis, costochondritis   BRENDON CHRISTOFFEL is a 36 y.o. who presents to the ED with Chest pain as described above. EKG shows no acute changes. Seems very muscular skeletal in nature sequences whenever I touch his anterior chest wall. He is afebrile. His abdominal exam is soft and benign. Patient does  have extensive medical history therefore do feel it will be prudent to further risk stratify with repeat troponins and further monitoring.  CT imaging Cheree Ditto will be ordered to further risk stratify the patient is high risk for pulmonary embolism with evidence of mild tachycardia and borderline hypoxia and room air. This could also be secondary to atelectasis or bronchitis though seems less clinical consistent with this.  Clinical Course as of Jul 30 2057  Sun Jul 30, 2016  1913 Repeat troponin is negative.   CT angiogram has no evidence of pneumonia, pulmonary embolism or airspace disease. At this point he feel patient is stable for discharge home follow up with PCP and cardiology.  [PR]    Clinical Course User Index [PR] Willy Eddy, MD     ____________________________________________   FINAL CLINICAL IMPRESSION(S) / ED DIAGNOSES  Final diagnoses:  Chest pain      NEW MEDICATIONS STARTED DURING THIS VISIT:  New Prescriptions   No medications on file     Note:  This document was prepared using Dragon voice recognition software and may include unintentional dictation errors.    Willy Eddy, MD 07/30/16 2101

## 2016-07-30 NOTE — ED Triage Notes (Signed)
Pt arrived via EMS from home with sub-sternal chest pain that radiates to the left and the back.  Pt states the pain woke him up. Pt reports headache as well since last Tuesday.  Pt took NTG x3 at home prior to EMS arrival.

## 2016-08-24 NOTE — Progress Notes (Signed)
Cardiology Office Note  Date:  08/25/2016   ID:  Cory Campbell, DOB 04-26-1980, MRN [id number]  PCP:  Cory Morgan, MD   Chief Complaint  Patient presents with  . other    ED FU Chest pain seen on 07/30/16 for CP. Patient c/o swelling in legs. Meds reviewed verbally with patient.     HPI:  Cory Campbell is a 36 year-old gentleman with a history of  Arthrogryposis multiplex congenita,  chronic pain in his legs,  asthma,  hypertension,  diabetes,  severe obstructive sleep apnea on CPAP since April 2012,  obesity,  seizure disorder,  psoriasis with several  evaluations in the emergency room for chest pain on April 23, 06/01/2011.  History of frequent falls, walks with leg braces and canes He presents for routine followup of his hypertension and chest pain  Seen in the emergency room 07/30/2016 for chest pain episode that woke him from sleep Pain was worse with movement, felt to be musculoskeletal His been seen in the emergency room several times for pain abdomen, lower back Hospital records reviewed with the patient in detail  BLood pressure down Over the past month or so Lost lots of weght since ostomy 220 down to 160 today  Trying to supplement his diet with nutritional supplements like boost He is scheduled to have ostomy repaired Denies any chest pain symptoms, denies shortness of breath No recent falls or trauma   followed at [facility] his diabetes  EKG shows normal sinus rhythm with rate 82 bpm, no significant ST or T-wave changes Shows normal sinus rhythm with rate 77 bpm PVCs nonspecific T wave abnormality, no change from previous EKGs  Other past medical history reviewed He reported having Diarrhea in November 2017, Resolved Goes to [facility], for primary care He does have sleep apnea, uses CPAP  Previously seen in the emergency room for chest pain  Pain was somewhat atypical, EKG was unchanged, cardiac enzymes negative.   Continues to use his crutches,  despite being told to use a motorized scooter given problems with his joints. Motorized scooter does not go everywhere that he needs to go, so has to use the crutches and try to walk Prior history of falls  Reports his sugars have been elevated, difficult to control, managed by primary care.  Prior visits to the emergency room  at [facility] for abdominal pain. He was started on MiraLAX for constipation. emergency room for chest pain on 02/09/2012. Workup was essentially negative. Chest pain radiates to the right, the left, up to the shoulder, sometimes downward. Symptoms present at rest and with exertion.  Blood pressure has been relatively stable.    PMH:   has a past medical history of Abrasion or friction burn of foot and toe(s), without mention of infection; Acid reflux; Adopted; Allergic rhinitis, cause unspecified; Anxiety; Arthritis; Arthrogryposis; Asthma; Bladder wall thickening; BPH (benign prostatic hyperplasia); Chest pain, unspecified; Chronic pain syndrome; Depressive disorder, not elsewhere classified; Diabetes mellitus without complication (HCC); Diverticulitis of colon (without mention of hemorrhage)(562.11); HTN (hypertension); Hyperlipidemia; Hypogonadism in male; Myalgia and myositis, unspecified; OAB (overactive bladder); Obesity; Other and unspecified noninfectious gastroenteritis and colitis(558.9); Other convulsions; Other psoriasis; 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  PSH:    Past Surgical History:  Procedure Laterality Date  .  APPENDECTOMY    . CORNEAL TRANSPLANT    . feet surgery     both  . HAND SURGERY     left and right  . leg surgery     left and right  . ORTHOPEDIC  SURGERY     hands, feet, knees, legs  . tubes in ears     both    Current Outpatient Prescriptions  Medication Sig Dispense Refill  . acyclovir (ZOVIRAX) 400 MG tablet Take 400 mg by mouth 2 (two) times daily.    Marland Kitchen. albuterol (PROVENTIL HFA;VENTOLIN HFA) 108 (90 BASE) MCG/ACT inhaler Inhale 2 puffs into the lungs every 4 (four) hours as needed for wheezing or shortness of breath.     Marland Kitchen. albuterol (PROVENTIL) (2.5 MG/3ML) 0.083% nebulizer solution Take 2.5 mg by nebulization every 4 (four) hours as needed for wheezing or shortness of breath.    . alfuzosin (UROXATRAL) 10 MG 24 hr tablet Take 10 mg by mouth daily.    Marland Kitchen. amLODipine (NORVASC) 10 MG tablet Take 10 mg by mouth daily.    Marland Kitchen. aspirin 81 MG tablet Take 81 mg by mouth daily.    . baclofen (LIORESAL) 10 MG tablet Take 10 mg by mouth 3 (three) times daily.    . beclomethasone (QVAR) 80 MCG/ACT inhaler Inhale 2 puffs into the lungs 2 (two) times daily.     . Buprenorphine (BUTRANS) 15 MCG/HR PTWK Place 15 mg onto the skin once a week. Saturday    . chlorthalidone (HYGROTON) 25 MG tablet Take 25 mg by mouth daily.    . citalopram (CELEXA) 20 MG tablet Take 20 mg by mouth daily.    . cloNIDine (CATAPRES - DOSED IN MG/24 HR) 0.1 mg/24hr patch Place 0.1 mg onto the skin once a week. saturday    . diclofenac sodium (VOLTAREN) 1 % GEL Apply 2-4 g topically 4 (four) times daily as needed. For pain.    Marland Kitchen. dicyclomine (BENTYL) 20 MG tablet Take 20 mg by mouth every 6 (six) hours.    . DOCOSAHEXAENOIC ACID PO Take 1 tablet by mouth daily.     . famotidine (PEPCID) 20 MG tablet Take 20 mg by mouth 2 (two) times daily.     . finasteride (PROSCAR) 5 MG tablet Take 5 mg by mouth daily.    . fluticasone (FLONASE) 50 MCG/ACT nasal spray Place 2 sprays into the nose daily.    . hydrALAZINE (APRESOLINE) 100 MG tablet take 1 tablet by mouth three times a day (Patient taking differently: take 0.5 tablet by mouth three times a day) 90 tablet 3  . hydrocortisone  cream 0.5 % Apply 1 application topically 2 (two) times daily.    Marland Kitchen. HYDROmorphone (DILAUDID) 4 MG tablet Take 1 tablet (4 mg total) by mouth every 3 (three) hours as needed. (Patient taking differently: Take 4 mg by mouth 2 (two) times daily as needed. ) 12 tablet 0  . ibuprofen (ADVIL,MOTRIN) 400 MG tablet Take 400 mg by mouth every 6 (six) hours as needed.    . Insulin Degludec (TRESIBA FLEXTOUCH) 200 UNIT/ML SOPN Inject 100 Units into the skin daily at 10 pm.    . ketoconazole (NIZORAL) 2 % shampoo Apply 1 application topically 2 (two) times a week. tues and thursday    . lisinopril (PRINIVIL,ZESTRIL) 40 MG tablet Take 40 mg by mouth daily.    Marland Kitchen. loratadine (CLARITIN) 10 MG tablet Take 10 mg by mouth daily.    . metoprolol (LOPRESSOR) 100 MG tablet  Take 1.5 tablets (150 mg total) by mouth 2 (two) times daily. (Patient taking differently: Take 100 mg by mouth 2 (two) times daily. ) 90 tablet 6  . miconazole (MICOTIN) 2 % cream Apply 1 application topically daily.     . mirtazapine (REMERON) 45 MG tablet Take 45 mg by mouth at bedtime.    . Multiple Vitamin (MULTIVITAMIN) tablet Take 1 tablet by mouth daily.    . naloxone (NARCAN) nasal spray 4 mg/0.1 mL One spray in either nostril once for known/suspected opioid overdose. May repeat every 2-3 minutes in alternating nostril til EMS arrives    . nitroGLYCERIN (NITROSTAT) 0.4 MG SL tablet Place 1 tablet (0.4 mg total) under the tongue every 5 (five) minutes as needed for chest pain. Call 911 if chest is not resolved w/ 3 tabs. 25 tablet 1  . nortriptyline (PAMELOR) 25 MG capsule Take 50 mg by mouth at bedtime.    Marland Kitchen NOVOLOG FLEXPEN 100 UNIT/ML FlexPen Inject 10 Units into the skin See admin instructions. With every meal and every snack  4  . nystatin (MYCOSTATIN) powder Apply topically 3 (three) times daily. For 10 days 15 g 0  . nystatin-triamcinolone (MYCOLOG II) cream Apply 1 application topically 2 (two) times daily.    Marland Kitchen omeprazole (PRILOSEC) 20  MG capsule Take 20 mg by mouth daily.    . ondansetron (ZOFRAN-ODT) 4 MG disintegrating tablet Take 4 mg by mouth every 8 (eight) hours as needed for nausea or vomiting.    . polyethylene glycol (MIRALAX / GLYCOLAX) packet Take 17 g by mouth 3 (three) times daily.     . potassium chloride SA (K-DUR,KLOR-CON) 20 MEQ tablet take 1 tablet by mouth once daily if needed (Patient taking differently: take 1 tablet by mouth once daily) 90 tablet 3  . pravastatin (PRAVACHOL) 80 MG tablet take 1 tablet by mouth at bedtime 30 tablet 3  . prednisoLONE acetate (PRED FORTE) 1 % ophthalmic suspension Place 1 drop into the right eye daily.     Marland Kitchen RA MELATONIN 1 MG SUBL Take 1 mg by mouth at bedtime.   0  . solifenacin (VESICARE) 10 MG tablet Take 10 mg by mouth daily.    Marland Kitchen spironolactone (ALDACTONE) 25 MG tablet Take 25 mg by mouth daily.    . tamsulosin (FLOMAX) 0.4 MG CAPS Take 0.4 mg by mouth daily.    Marland Kitchen tiZANidine (ZANAFLEX) 4 MG capsule Take 4 mg by mouth 3 (three) times daily as needed.     . zolpidem (AMBIEN) 10 MG tablet Take 10 mg by mouth at bedtime.     No current facility-administered medications for this visit.      Allergies:   Penicillins; Erythromycin base; and Erythromycin base   Social History:  The patient  reports that he has been smoking.  He has smoked for the past 0.00 years. He has never used smokeless tobacco. He reports that he does not drink alcohol or use drugs.   Family History:   Family history is unknown by patient.    Review of Systems: Review of Systems  Constitutional: Positive for weight loss.  Respiratory: Negative.   Cardiovascular: Negative.   Gastrointestinal: Negative.   Musculoskeletal: Negative.        Unsteady gait, leg weakness  Neurological: Negative.   Psychiatric/Behavioral: Negative.   All other systems reviewed and are negative.    PHYSICAL EXAM: VS:  BP 112/77 (BP Location: Left Arm, Patient Position: Sitting, Cuff Size: Normal)   Pulse  77    Ht 5\' 4"  (1.626 m)   Wt 161 lb (73 kg)   BMI 27.64 kg/m  , BMI Body mass index is 27.64 kg/m.  GEN: Well nourished, well developed, in no acute distress  HEENT: normal  Neck: no JVD, carotid bruits, or masses Cardiac: RRR; no murmurs, rubs, or gallops,no edema  Respiratory:  clear to auscultation bilaterally, normal work of breathing GI: soft, nontender, nondistended, + BS MS: no deformity  , Atrophy lower extremities/ Muscle wasting, braces in place Skin: warm and dry, no rash Neuro:  Strength and sensation are intact Psych: euthymic mood, full affect    Recent Labs: 02/26/2016: ALT 23 07/30/2016: BUN 15; Creatinine, Ser 0.73; Hemoglobin 13.0; Platelets 321; Potassium 3.4; Sodium 136    Lipid Panel No results found for: CHOL, HDL, LDLCALC, TRIG    Wt Readings from Last 3 Encounters:  08/25/16 161 lb (73 kg)  07/30/16 168 lb (76.2 kg)  05/09/16 175 lb (79.4 kg)       ASSESSMENT AND PLAN:  Essential hypertension - Plan: EKG 12-Lead Blood pressure has been dropping , likely secondary to dramatic weight loss over the past several months   we will hold his chlorthalidone as he has trouble keeping up with his fluids in the setting of having an ostomy. Recommended he monitor blood pressure at home and if this continues to run low we would decrease the hydralazine down to 25 mg 3 times a day or even stop the hydralazine Recommended he increase his calorie intake to stabilize his weight  Class 1 obesity due to excess calories without serious comorbidity with body mass  Dramatic weight loss in the setting of having ostomy   discussed strategies of stabilizing his weight Down from 220 pounds now down to 160  Frequent falls Denies any recent falls   Obstructive sleep apnea on CPAP Compliant with his CPAP  Chest pain, unspecified type Previous history of atypical chest pain No further workup at this time, denies having any further pain   Type 2 diabetes mellitus with  complication, without long-term current use of insulin (HCC) working with [facility] Numbers will likely be much improved given recent dramatic weight loss    preop cardiovascular No further testing needed, acceptable risk for GI/ostomy surgery He we will  follow-up with [facility] to schedule the surgery   Total encounter time more than 25 minutes  Greater than 50% was spent in counseling and coordination of care with the patient  Disposition:   F/U  6 months   Orders Placed This Encounter  Procedures  . EKG 12-Lead     Signed, Dossie Arbour, M.D., Ph.D. 08/25/2016  [facility] [place], [place] [phone]

## 2016-08-25 ENCOUNTER — Ambulatory Visit (INDEPENDENT_AMBULATORY_CARE_PROVIDER_SITE_OTHER): Payer: Medicaid Other | Admitting: Cardiovascular Disease

## 2016-08-25 ENCOUNTER — Encounter: Payer: Self-pay | Admitting: *Deleted

## 2016-08-25 ENCOUNTER — Encounter: Payer: Self-pay | Admitting: Cardiovascular Disease

## 2016-08-25 VITALS — BP 112/77 | HR 77 | Ht 64.0 in | Wt 161.0 lb

## 2016-08-25 DIAGNOSIS — R000 Tachycardia, unspecified: Secondary | ICD-10-CM | POA: Diagnosis not present

## 2016-08-25 DIAGNOSIS — E118 Type 2 diabetes mellitus with unspecified complications: Secondary | ICD-10-CM | POA: Diagnosis not present

## 2016-08-25 DIAGNOSIS — G4733 Obstructive sleep apnea (adult) (pediatric): Secondary | ICD-10-CM

## 2016-08-25 DIAGNOSIS — Z9989 Dependence on other enabling machines and devices: Secondary | ICD-10-CM | POA: Diagnosis not present

## 2016-08-25 DIAGNOSIS — R079 Chest pain, unspecified: Secondary | ICD-10-CM

## 2016-08-25 DIAGNOSIS — R0602 Shortness of breath: Secondary | ICD-10-CM

## 2016-08-25 DIAGNOSIS — R296 Repeated falls: Secondary | ICD-10-CM | POA: Diagnosis not present

## 2016-08-25 NOTE — Patient Instructions (Signed)
Medication Instructions:   Take chlorthalidone as needed Monitor blood pressure If BP continues to run low, Cut the hydralazine either to a 1/4 pill or stop  Labwork:  No new labs needed  Testing/Procedures:  No further testing at this time   Follow-Up: It was a pleasure seeing you in the office today. Please call us if you have new issues that need to be addressed before your next appt.  [phone]  Your physician wants you to follow-up in: 6 months.  You will receive a reminder letter in the mail two months in advance. If you don't receive a letter, please call our office to schedule the follow-up appointment.  If you need a refill on your cardiac medications before your next appointment, please call your pharmacy.

## 2016-08-29 ENCOUNTER — Other Ambulatory Visit: Payer: Self-pay | Admitting: Cardiovascular Disease

## 2016-08-30 NOTE — Telephone Encounter (Signed)
Isosorbide 30 mg tablet refill request.  Not on current medication list but on previous med list, ok to refill?

## 2016-10-06 ENCOUNTER — Emergency Department: Payer: Medicaid Other

## 2016-10-06 ENCOUNTER — Encounter: Payer: Self-pay | Admitting: Emergency Medicine

## 2016-10-06 ENCOUNTER — Inpatient Hospital Stay
Admission: EM | Admit: 2016-10-06 | Discharge: 2016-10-10 | DRG: 303 | Disposition: A | Discharge status: Home / Self Care | Payer: Medicaid Other | Attending: Internal Medicine | Admitting: Internal Medicine

## 2016-10-06 DIAGNOSIS — Z881 Allergy status to other antibiotic agents status: Secondary | ICD-10-CM

## 2016-10-06 DIAGNOSIS — I25110 Atherosclerotic heart disease of native coronary artery with unstable angina pectoris: Principal | ICD-10-CM | POA: Diagnosis present

## 2016-10-06 DIAGNOSIS — E1149 Type 2 diabetes mellitus with other diabetic neurological complication: Secondary | ICD-10-CM | POA: Diagnosis present

## 2016-10-06 DIAGNOSIS — J45909 Unspecified asthma, uncomplicated: Secondary | ICD-10-CM | POA: Diagnosis present

## 2016-10-06 DIAGNOSIS — I10 Essential (primary) hypertension: Secondary | ICD-10-CM | POA: Diagnosis present

## 2016-10-06 DIAGNOSIS — Z933 Colostomy status: Secondary | ICD-10-CM

## 2016-10-06 DIAGNOSIS — Z6828 Body mass index (BMI) 28.0-28.9, adult: Secondary | ICD-10-CM

## 2016-10-06 DIAGNOSIS — I959 Hypotension, unspecified: Secondary | ICD-10-CM | POA: Diagnosis present

## 2016-10-06 DIAGNOSIS — Z880 Allergy status to penicillin: Secondary | ICD-10-CM

## 2016-10-06 DIAGNOSIS — Z8673 Personal history of transient ischemic attack (TIA), and cerebral infarction without residual deficits: Secondary | ICD-10-CM

## 2016-10-06 DIAGNOSIS — K219 Gastro-esophageal reflux disease without esophagitis: Secondary | ICD-10-CM | POA: Diagnosis present

## 2016-10-06 DIAGNOSIS — G4733 Obstructive sleep apnea (adult) (pediatric): Secondary | ICD-10-CM | POA: Diagnosis present

## 2016-10-06 DIAGNOSIS — E785 Hyperlipidemia, unspecified: Secondary | ICD-10-CM | POA: Diagnosis present

## 2016-10-06 DIAGNOSIS — R079 Chest pain, unspecified: Secondary | ICD-10-CM

## 2016-10-06 DIAGNOSIS — Z947 Corneal transplant status: Secondary | ICD-10-CM

## 2016-10-06 DIAGNOSIS — E669 Obesity, unspecified: Secondary | ICD-10-CM | POA: Diagnosis present

## 2016-10-06 DIAGNOSIS — Z7982 Long term (current) use of aspirin: Secondary | ICD-10-CM

## 2016-10-06 DIAGNOSIS — I313 Pericardial effusion (noninflammatory): Secondary | ICD-10-CM | POA: Diagnosis present

## 2016-10-06 DIAGNOSIS — I200 Unstable angina: Secondary | ICD-10-CM | POA: Diagnosis present

## 2016-10-06 DIAGNOSIS — Z79899 Other long term (current) drug therapy: Secondary | ICD-10-CM

## 2016-10-06 DIAGNOSIS — I252 Old myocardial infarction: Secondary | ICD-10-CM

## 2016-10-06 DIAGNOSIS — Z794 Long term (current) use of insulin: Secondary | ICD-10-CM

## 2016-10-06 DIAGNOSIS — F17210 Nicotine dependence, cigarettes, uncomplicated: Secondary | ICD-10-CM | POA: Diagnosis present

## 2016-10-06 DIAGNOSIS — Z23 Encounter for immunization: Secondary | ICD-10-CM

## 2016-10-06 DIAGNOSIS — Q743 Arthrogryposis multiplex congenita: Secondary | ICD-10-CM

## 2016-10-06 DIAGNOSIS — G894 Chronic pain syndrome: Secondary | ICD-10-CM | POA: Diagnosis present

## 2016-10-06 LAB — CBC WITH DIFFERENTIAL/PLATELET
BLASTS: 0 %
Band Neutrophils: 0 %
Basophils Absolute: 0 10*3/uL (ref 0–0.1)
Basophils Relative: 0 %
Eosinophils Absolute: 1.7 10*3/uL — ABNORMAL HIGH (ref 0–0.7)
Eosinophils Relative: 15 %
HEMATOCRIT: 41.4 % (ref 40.0–52.0)
HEMOGLOBIN: 13.7 g/dL (ref 13.0–18.0)
Lymphocytes Relative: 27 %
Lymphs Abs: 3 10*3/uL (ref 1.0–3.6)
MCH: 28.7 pg (ref 26.0–34.0)
MCHC: 33.2 g/dL (ref 32.0–36.0)
MCV: 86.5 fL (ref 80.0–100.0)
MONO ABS: 0.3 10*3/uL (ref 0.2–1.0)
Metamyelocytes Relative: 0 %
Monocytes Relative: 3 %
Myelocytes: 0 %
NEUTROS PCT: 55 %
NRBC: 0 /100{WBCs}
Neutro Abs: 6 10*3/uL (ref 1.4–6.5)
Other: 0 %
PROMYELOCYTES ABS: 0 %
Platelets: 237 10*3/uL (ref 150–440)
RBC: 4.78 MIL/uL (ref 4.40–5.90)
RDW: 15.2 % — ABNORMAL HIGH (ref 11.5–14.5)
WBC: 11 10*3/uL — AB (ref 3.8–10.6)

## 2016-10-06 LAB — URINALYSIS, COMPLETE (UACMP) WITH MICROSCOPIC
BILIRUBIN URINE: NEGATIVE
Bacteria, UA: NONE SEEN
GLUCOSE, UA: NEGATIVE mg/dL
Hgb urine dipstick: NEGATIVE
KETONES UR: NEGATIVE mg/dL
Leukocytes, UA: NEGATIVE
Nitrite: NEGATIVE
PH: 5 (ref 5.0–8.0)
Protein, ur: NEGATIVE mg/dL
Specific Gravity, Urine: 1.015 (ref 1.005–1.030)

## 2016-10-06 LAB — COMPREHENSIVE METABOLIC PANEL
ALT: 16 U/L — ABNORMAL LOW (ref 17–63)
ANION GAP: 10 (ref 5–15)
AST: 21 U/L (ref 15–41)
Albumin: 3.7 g/dL (ref 3.5–5.0)
Alkaline Phosphatase: 82 U/L (ref 38–126)
BILIRUBIN TOTAL: 0.5 mg/dL (ref 0.3–1.2)
BUN: 9 mg/dL (ref 6–20)
CALCIUM: 9.6 mg/dL (ref 8.9–10.3)
CO2: 26 mmol/L (ref 22–32)
Chloride: 102 mmol/L (ref 101–111)
Creatinine, Ser: 0.75 mg/dL (ref 0.61–1.24)
GFR calc non Af Amer: >60 mL/min (ref >60)
GLUCOSE: 108 mg/dL — AB (ref 65–99)
POTASSIUM: 3.5 mmol/L (ref 3.5–5.1)
SODIUM: 138 mmol/L (ref 135–145)
Total Protein: 7 g/dL (ref 6.5–8.1)

## 2016-10-06 LAB — URINE DRUG SCREEN, QUALITATIVE (ARMC ONLY)
Amphetamines, Ur Screen: NOT DETECTED
BARBITURATES, UR SCREEN: NOT DETECTED
BENZODIAZEPINE, UR SCRN: NOT DETECTED
Cannabinoid 50 Ng, Ur ~~LOC~~: NOT DETECTED
Cocaine Metabolite,Ur ~~LOC~~: NOT DETECTED
MDMA (Ecstasy)Ur Screen: NOT DETECTED
METHADONE SCREEN, URINE: NOT DETECTED
OPIATE, UR SCREEN: NOT DETECTED
PHENCYCLIDINE (PCP) UR S: NOT DETECTED
Tricyclic, Ur Screen: POSITIVE — AB

## 2016-10-06 LAB — TROPONIN I: Troponin I: <0.03 ng/mL (ref <0.03)

## 2016-10-06 LAB — GLUCOSE, CAPILLARY: Glucose-Capillary: 208 mg/dL — ABNORMAL HIGH (ref 65–99)

## 2016-10-06 LAB — ETHANOL: Alcohol, Ethyl (B): <5 mg/dL (ref <5)

## 2016-10-06 MED ORDER — NITROGLYCERIN IN D5W 200-5 MCG/ML-% IV SOLN
0.0000 ug/min | INTRAVENOUS | Status: DC
  Administered 2016-10-06: 60 ug/min via INTRAVENOUS
  Filled 2016-10-06: qty 250

## 2016-10-06 MED ORDER — ZOLPIDEM TARTRATE 5 MG PO TABS
10.0000 mg | ORAL_TABLET | Freq: Every day | ORAL | Status: DC
  Administered 2016-10-06 – 2016-10-09 (×4): 10 mg via ORAL
  Filled 2016-10-06 (×4): qty 2

## 2016-10-06 MED ORDER — INSULIN ASPART 100 UNIT/ML ~~LOC~~ SOLN
0.0000 [IU] | Freq: Every day | SUBCUTANEOUS | Status: DC
  Administered 2016-10-06: 2 [IU] via SUBCUTANEOUS
  Filled 2016-10-06 (×2): qty 1

## 2016-10-06 MED ORDER — MIRTAZAPINE 15 MG PO TABS
45.0000 mg | ORAL_TABLET | Freq: Every day | ORAL | Status: DC
  Administered 2016-10-06 – 2016-10-09 (×4): 45 mg via ORAL
  Filled 2016-10-06 (×4): qty 3

## 2016-10-06 MED ORDER — CHLORTHALIDONE 25 MG PO TABS
25.0000 mg | ORAL_TABLET | Freq: Every day | ORAL | Status: DC
  Administered 2016-10-07 – 2016-10-08 (×2): 25 mg via ORAL
  Filled 2016-10-06 (×2): qty 1

## 2016-10-06 MED ORDER — NICOTINE 14 MG/24HR TD PT24
14.0000 mg | MEDICATED_PATCH | Freq: Every day | TRANSDERMAL | Status: DC
  Administered 2016-10-07 – 2016-10-10 (×3): 14 mg via TRANSDERMAL
  Filled 2016-10-06 (×4): qty 1

## 2016-10-06 MED ORDER — PANTOPRAZOLE SODIUM 40 MG PO TBEC
40.0000 mg | DELAYED_RELEASE_TABLET | Freq: Every day | ORAL | Status: DC
  Administered 2016-10-06 – 2016-10-10 (×5): 40 mg via ORAL
  Filled 2016-10-06 (×5): qty 1

## 2016-10-06 MED ORDER — ONE-DAILY MULTI VITAMINS PO TABS: 1.0000 | ORAL_TABLET | Freq: Every day | ORAL | Status: DC

## 2016-10-06 MED ORDER — IOPAMIDOL (ISOVUE-370) INJECTION 76%
100.0000 mL | Freq: Once | INTRAVENOUS | Status: AC | PRN
  Administered 2016-10-06: 100 mL via INTRAVENOUS

## 2016-10-06 MED ORDER — LISINOPRIL 20 MG PO TABS
40.0000 mg | ORAL_TABLET | Freq: Every day | ORAL | Status: DC
  Administered 2016-10-07 – 2016-10-08 (×2): 40 mg via ORAL
  Filled 2016-10-06 (×3): qty 2

## 2016-10-06 MED ORDER — FLUTICASONE PROPIONATE 50 MCG/ACT NA SUSP
2.0000 | Freq: Every day | NASAL | Status: DC
  Administered 2016-10-07 – 2016-10-10 (×4): 2 via NASAL
  Filled 2016-10-06: qty 16

## 2016-10-06 MED ORDER — ALBUTEROL SULFATE (2.5 MG/3ML) 0.083% IN NEBU: 2.5000 mg | INHALATION_SOLUTION | RESPIRATORY_TRACT | Status: DC | PRN

## 2016-10-06 MED ORDER — TAMSULOSIN HCL 0.4 MG PO CAPS
0.4000 mg | ORAL_CAPSULE | Freq: Every day | ORAL | Status: DC
  Administered 2016-10-06 – 2016-10-10 (×5): 0.4 mg via ORAL
  Filled 2016-10-06 (×5): qty 1

## 2016-10-06 MED ORDER — FINASTERIDE 5 MG PO TABS
5.0000 mg | ORAL_TABLET | Freq: Every day | ORAL | Status: DC
  Administered 2016-10-07 – 2016-10-10 (×4): 5 mg via ORAL
  Filled 2016-10-06 (×4): qty 1

## 2016-10-06 MED ORDER — ACETAMINOPHEN 325 MG PO TABS
650.0000 mg | ORAL_TABLET | ORAL | Status: DC | PRN
  Administered 2016-10-06 – 2016-10-09 (×2): 650 mg via ORAL
  Filled 2016-10-06 (×2): qty 2

## 2016-10-06 MED ORDER — LORATADINE 10 MG PO TABS
10.0000 mg | ORAL_TABLET | Freq: Every day | ORAL | Status: DC
  Administered 2016-10-07 – 2016-10-10 (×4): 10 mg via ORAL
  Filled 2016-10-06 (×5): qty 1

## 2016-10-06 MED ORDER — IBUPROFEN 400 MG PO TABS: 400.0000 mg | ORAL_TABLET | Freq: Four times a day (QID) | ORAL | Status: DC | PRN

## 2016-10-06 MED ORDER — PREDNISOLONE ACETATE 1 % OP SUSP
1.0000 [drp] | Freq: Every day | OPHTHALMIC | Status: DC
  Administered 2016-10-07 – 2016-10-10 (×4): 1 [drp] via OPHTHALMIC
  Filled 2016-10-06 (×2): qty 1

## 2016-10-06 MED ORDER — CITALOPRAM HYDROBROMIDE 20 MG PO TABS
20.0000 mg | ORAL_TABLET | Freq: Every day | ORAL | Status: DC
  Administered 2016-10-06 – 2016-10-10 (×5): 20 mg via ORAL
  Filled 2016-10-06 (×6): qty 1

## 2016-10-06 MED ORDER — INSULIN ASPART 100 UNIT/ML ~~LOC~~ SOLN
0.0000 [IU] | Freq: Three times a day (TID) | SUBCUTANEOUS | Status: DC
  Administered 2016-10-07: 2 [IU] via SUBCUTANEOUS
  Administered 2016-10-07: 3 [IU] via SUBCUTANEOUS
  Administered 2016-10-08: 2 [IU] via SUBCUTANEOUS
  Administered 2016-10-08 (×2): 3 [IU] via SUBCUTANEOUS
  Administered 2016-10-09: 2 [IU] via SUBCUTANEOUS
  Administered 2016-10-09: 1 [IU] via SUBCUTANEOUS
  Administered 2016-10-10 (×2): 2 [IU] via SUBCUTANEOUS
  Filled 2016-10-06 (×9): qty 1

## 2016-10-06 MED ORDER — ENOXAPARIN SODIUM 40 MG/0.4ML ~~LOC~~ SOLN
40.0000 mg | SUBCUTANEOUS | Status: DC
  Administered 2016-10-06 – 2016-10-09 (×4): 40 mg via SUBCUTANEOUS
  Filled 2016-10-06 (×4): qty 0.4

## 2016-10-06 MED ORDER — HYDROMORPHONE HCL 2 MG PO TABS
2.0000 mg | ORAL_TABLET | Freq: Two times a day (BID) | ORAL | Status: DC
  Administered 2016-10-07 – 2016-10-10 (×7): 2 mg via ORAL
  Filled 2016-10-06 (×8): qty 1

## 2016-10-06 MED ORDER — ONDANSETRON HCL 4 MG/2ML IJ SOLN
4.0000 mg | Freq: Four times a day (QID) | INTRAMUSCULAR | Status: DC | PRN
  Administered 2016-10-07 – 2016-10-08 (×2): 4 mg via INTRAVENOUS
  Filled 2016-10-06 (×2): qty 2

## 2016-10-06 MED ORDER — ACYCLOVIR 200 MG PO CAPS
400.0000 mg | ORAL_CAPSULE | Freq: Two times a day (BID) | ORAL | Status: DC
  Administered 2016-10-06 – 2016-10-10 (×8): 400 mg via ORAL
  Filled 2016-10-06 (×10): qty 2

## 2016-10-06 MED ORDER — MELATONIN 1 MG SL SUBL: 1.0000 mg | SUBLINGUAL_TABLET | Freq: Every day | SUBLINGUAL | Status: DC

## 2016-10-06 MED ORDER — DARIFENACIN HYDROBROMIDE ER 7.5 MG PO TB24
7.5000 mg | ORAL_TABLET | Freq: Every day | ORAL | Status: DC
  Administered 2016-10-07 – 2016-10-10 (×4): 7.5 mg via ORAL
  Filled 2016-10-06 (×4): qty 1

## 2016-10-06 MED ORDER — TAB-A-VITE/IRON PO TABS
1.0000 | ORAL_TABLET | Freq: Every day | ORAL | Status: DC
  Administered 2016-10-07 – 2016-10-10 (×4): 1 via ORAL
  Filled 2016-10-06 (×5): qty 1

## 2016-10-06 MED ORDER — BUPRENORPHINE 15 MCG/HR TD PTWK: 15.0000 mg | MEDICATED_PATCH | TRANSDERMAL | Status: DC

## 2016-10-06 MED ORDER — NITROGLYCERIN IN D5W 200-5 MCG/ML-% IV SOLN
0.0000 ug/min | Freq: Once | INTRAVENOUS | Status: AC
  Administered 2016-10-06: 166.667 ug/min via INTRAVENOUS
  Filled 2016-10-06: qty 250

## 2016-10-06 MED ORDER — HYDROMORPHONE HCL 2 MG PO TABS
4.0000 mg | ORAL_TABLET | Freq: Two times a day (BID) | ORAL | Status: DC | PRN
  Administered 2016-10-06 – 2016-10-09 (×5): 4 mg via ORAL
  Filled 2016-10-06 (×6): qty 2

## 2016-10-06 MED ORDER — SIMVASTATIN 20 MG PO TABS: 40.0000 mg | ORAL_TABLET | Freq: Every day | ORAL | Status: DC

## 2016-10-06 MED ORDER — PNEUMOCOCCAL VAC POLYVALENT 25 MCG/0.5ML IJ INJ
0.5000 mL | INJECTION | INTRAMUSCULAR | Status: AC
  Administered 2016-10-09: 0.5 mL via INTRAMUSCULAR
  Filled 2016-10-06 (×2): qty 0.5

## 2016-10-06 MED ORDER — BUDESONIDE 0.5 MG/2ML IN SUSP: 2.0000 mL | Freq: Two times a day (BID) | RESPIRATORY_TRACT | Status: DC

## 2016-10-06 MED ORDER — ISOSORBIDE MONONITRATE ER 30 MG PO TB24
30.0000 mg | ORAL_TABLET | Freq: Two times a day (BID) | ORAL | Status: DC
  Administered 2016-10-06 – 2016-10-08 (×4): 30 mg via ORAL
  Filled 2016-10-06 (×4): qty 1

## 2016-10-06 MED ORDER — CLONIDINE HCL 0.1 MG/24HR TD PTWK
0.1000 mg | MEDICATED_PATCH | TRANSDERMAL | Status: DC
  Administered 2016-10-07: 0.1 mg via TRANSDERMAL
  Filled 2016-10-06: qty 1

## 2016-10-06 MED ORDER — HYDRALAZINE HCL 50 MG PO TABS
100.0000 mg | ORAL_TABLET | Freq: Three times a day (TID) | ORAL | Status: DC
  Administered 2016-10-06 – 2016-10-08 (×4): 100 mg via ORAL
  Filled 2016-10-06 (×5): qty 2

## 2016-10-06 MED ORDER — DICYCLOMINE HCL 20 MG PO TABS
20.0000 mg | ORAL_TABLET | Freq: Four times a day (QID) | ORAL | Status: DC
  Administered 2016-10-06 – 2016-10-10 (×16): 20 mg via ORAL
  Filled 2016-10-06 (×19): qty 1

## 2016-10-06 MED ORDER — INSULIN ASPART 100 UNIT/ML FLEXPEN
10.0000 [IU] | PEN_INJECTOR | Freq: Three times a day (TID) | SUBCUTANEOUS | Status: DC
  Filled 2016-10-06: qty 3

## 2016-10-06 MED ORDER — ONDANSETRON 4 MG PO TBDP: 4.0000 mg | ORAL_TABLET | Freq: Three times a day (TID) | ORAL | Status: DC | PRN

## 2016-10-06 MED ORDER — NITROGLYCERIN 0.4 MG SL SUBL: 0.4000 mg | SUBLINGUAL_TABLET | SUBLINGUAL | Status: DC | PRN

## 2016-10-06 MED ORDER — ACYCLOVIR 400 MG PO TABS
400.0000 mg | ORAL_TABLET | Freq: Two times a day (BID) | ORAL | Status: DC
  Filled 2016-10-06: qty 1

## 2016-10-06 MED ORDER — SPIRONOLACTONE 25 MG PO TABS
25.0000 mg | ORAL_TABLET | Freq: Every evening | ORAL | Status: DC
  Administered 2016-10-06 – 2016-10-09 (×3): 25 mg via ORAL
  Filled 2016-10-06 (×3): qty 1

## 2016-10-06 MED ORDER — ASPIRIN EC 81 MG PO TBEC
81.0000 mg | DELAYED_RELEASE_TABLET | Freq: Every day | ORAL | Status: DC
  Administered 2016-10-07 – 2016-10-10 (×4): 81 mg via ORAL
  Filled 2016-10-06 (×4): qty 1

## 2016-10-06 MED ORDER — NORTRIPTYLINE HCL 25 MG PO CAPS
50.0000 mg | ORAL_CAPSULE | Freq: Every day | ORAL | Status: DC
  Administered 2016-10-06 – 2016-10-09 (×4): 50 mg via ORAL
  Filled 2016-10-06 (×6): qty 2

## 2016-10-06 MED ORDER — TIZANIDINE HCL 4 MG PO TABS
4.0000 mg | ORAL_TABLET | Freq: Three times a day (TID) | ORAL | Status: DC | PRN
  Administered 2016-10-07 – 2016-10-08 (×2): 4 mg via ORAL
  Filled 2016-10-06 (×5): qty 1

## 2016-10-06 MED ORDER — MELATONIN 5 MG PO TABS
2.5000 mg | ORAL_TABLET | Freq: Every day | ORAL | Status: DC
  Administered 2016-10-06 – 2016-10-09 (×4): 2.5 mg via ORAL
  Filled 2016-10-06 (×6): qty 0.5

## 2016-10-06 MED ORDER — ALFUZOSIN HCL ER 10 MG PO TB24
10.0000 mg | ORAL_TABLET | Freq: Every day | ORAL | Status: DC
  Administered 2016-10-07 – 2016-10-10 (×4): 10 mg via ORAL
  Filled 2016-10-06 (×4): qty 1

## 2016-10-06 MED ORDER — ALBUTEROL SULFATE HFA 108 (90 BASE) MCG/ACT IN AERS: 2.0000 | INHALATION_SPRAY | RESPIRATORY_TRACT | Status: DC | PRN

## 2016-10-06 MED ORDER — HYDROMORPHONE HCL 1 MG/ML IJ SOLN
1.0000 mg | Freq: Once | INTRAMUSCULAR | Status: AC
  Administered 2016-10-06: 1 mg via INTRAVENOUS
  Filled 2016-10-06: qty 1

## 2016-10-06 MED ORDER — POTASSIUM CHLORIDE CRYS ER 20 MEQ PO TBCR
20.0000 meq | EXTENDED_RELEASE_TABLET | Freq: Every day | ORAL | Status: DC
  Administered 2016-10-07 – 2016-10-10 (×4): 20 meq via ORAL
  Filled 2016-10-06 (×4): qty 1

## 2016-10-06 MED ORDER — BACLOFEN 10 MG PO TABS
10.0000 mg | ORAL_TABLET | Freq: Three times a day (TID) | ORAL | Status: DC
  Administered 2016-10-06 – 2016-10-08 (×5): 10 mg via ORAL
  Filled 2016-10-06 (×7): qty 1

## 2016-10-06 MED ORDER — PRAVASTATIN SODIUM 20 MG PO TABS
80.0000 mg | ORAL_TABLET | Freq: Every day | ORAL | Status: DC
  Administered 2016-10-06 – 2016-10-09 (×4): 80 mg via ORAL
  Filled 2016-10-06 (×2): qty 2
  Filled 2016-10-06 (×2): qty 4

## 2016-10-06 MED ORDER — GUAIFENESIN-DM 100-10 MG/5ML PO SYRP
5.0000 mL | ORAL_SOLUTION | ORAL | Status: DC | PRN
  Administered 2016-10-06: 5 mL via ORAL
  Filled 2016-10-06 (×2): qty 5

## 2016-10-06 MED ORDER — AMLODIPINE BESYLATE 10 MG PO TABS
10.0000 mg | ORAL_TABLET | Freq: Every day | ORAL | Status: DC
  Administered 2016-10-07 – 2016-10-08 (×2): 10 mg via ORAL
  Filled 2016-10-06 (×2): qty 1

## 2016-10-06 MED ORDER — KETOCONAZOLE 2 % EX SHAM
1.0000 "application " | MEDICATED_SHAMPOO | CUTANEOUS | Status: DC
  Filled 2016-10-06 (×2): qty 120

## 2016-10-06 MED ORDER — FAMOTIDINE 20 MG PO TABS
20.0000 mg | ORAL_TABLET | Freq: Two times a day (BID) | ORAL | Status: DC
  Administered 2016-10-06 – 2016-10-10 (×8): 20 mg via ORAL
  Filled 2016-10-06 (×8): qty 1

## 2016-10-06 MED ORDER — METOPROLOL TARTRATE 50 MG PO TABS
150.0000 mg | ORAL_TABLET | Freq: Two times a day (BID) | ORAL | Status: DC
  Administered 2016-10-06 – 2016-10-08 (×4): 150 mg via ORAL
  Filled 2016-10-06 (×5): qty 3

## 2016-10-06 MED ORDER — INSULIN ASPART 100 UNIT/ML ~~LOC~~ SOLN
10.0000 [IU] | Freq: Three times a day (TID) | SUBCUTANEOUS | Status: DC
  Administered 2016-10-08 – 2016-10-10 (×8): 10 [IU] via SUBCUTANEOUS
  Filled 2016-10-06 (×9): qty 1

## 2016-10-06 MED ORDER — POLYETHYLENE GLYCOL 3350 17 G PO PACK
17.0000 g | PACK | Freq: Three times a day (TID) | ORAL | Status: DC
  Administered 2016-10-06 – 2016-10-10 (×5): 17 g via ORAL
  Filled 2016-10-06 (×8): qty 1

## 2016-10-06 MED ORDER — NITROGLYCERIN 0.4 MG SL SUBL
0.4000 mg | SUBLINGUAL_TABLET | SUBLINGUAL | Status: DC | PRN
  Administered 2016-10-06 – 2016-10-09 (×8): 0.4 mg via SUBLINGUAL
  Filled 2016-10-06 (×6): qty 1

## 2016-10-06 MED ORDER — ASPIRIN 81 MG PO TABS: 81.0000 mg | ORAL_TABLET | Freq: Every day | ORAL | Status: DC

## 2016-10-06 NOTE — ED Triage Notes (Signed)
Pt to ED by EMS from home with c/o of chest pain that started this morning and is sharp and shooting from the center of his chest. Pt states lightheadedness, SOB and nausea. EMS administered 324 of Aspirin in route and state that pt NSR.

## 2016-10-06 NOTE — ED Provider Notes (Signed)
[facility] Emergency Department Provider Note  ____________________________________________  Time seen: Approximately 1:40 PM  I have reviewed the triage vital signs and the nursing notes.   HISTORY  Chief Complaint Chest Pain    HPI Cory Campbell is a 36 y.o. male comes the ED complaining of sudden onset of chest pain this morning, intermittent lasting up to 10 minutes at a time, sharp, central chest radiating to back and bilateral arms. Associated with shortness of breath, no vomiting or diaphoresis. Nonexertional nonpleuritic. No dizziness or syncope. No palpitations. Feels like heart attack he's had in the past. Reports compliance with his medications. No identifiable aggravating or alleviating factors.     Past Medical History:  Diagnosis Date  . Abrasion or friction burn of foot and toe(s), without mention of infection   . Acid reflux   . Adopted   . Allergic rhinitis, cause unspecified   . Anxiety   . Arthritis   . Arthrogryposis   . Asthma   . Bladder wall thickening   . BPH (benign prostatic hyperplasia)   . Chest pain, unspecified   . Chronic pain syndrome   . Depressive disorder, not elsewhere classified   . Diabetes mellitus without complication (HCC)   . Diverticulitis of colon (without mention of hemorrhage)(562.11)   . HTN (hypertension)   . Hyperlipidemia   . Hypogonadism in male   . Myalgia and myositis, unspecified   . OAB (overactive bladder)   . Obesity   . Other and unspecified noninfectious gastroenteritis and colitis(558.9)   . Other convulsions   . Other psoriasis   . 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 Premature baby    born premature  . Sleep apnea   . 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 or other cause, without mention of thyrotoxic crisis or storm    hyperthyroidism  . TIA (transient ischemic attack)   . Type II or unspecified type diabetes mellitus with neurological manifestations, not  stated as uncontrolled(250.60)      Patient Active Problem List   Diagnosis Date Noted  . Shortness of breath 06/23/2015  . Obesity 05/18/2014  . Frequent falls 08/12/2013  . Pain of left clavicle 08/12/2013  . Tachycardia 01/21/2013  . Obstructive sleep apnea on CPAP 06/07/2012  . Chest pain 06/20/2011  . Hypertension 06/20/2011  . Diabetes mellitus (HCC) 06/20/2011     Past Surgical History:  Procedure Laterality Date  . APPENDECTOMY    . CORNEAL TRANSPLANT    . feet surgery     both  . HAND SURGERY     left and right  . leg surgery     left and right  . ORTHOPEDIC SURGERY     hands, feet, knees, legs  . tubes in ears     both     Prior to Admission medications   Medication Sig Start Date End Date Taking? Authorizing Provider  acyclovir (ZOVIRAX) 400 MG tablet Take 400 mg by mouth 2 (two) times daily.    [provider]  albuterol (PROVENTIL HFA;VENTOLIN HFA) 108 (90 BASE) MCG/ACT inhaler Inhale 2 puffs into the lungs every 4 (four) hours as needed for wheezing or shortness of breath.     [provider]  albuterol (PROVENTIL) (2.5 MG/3ML) 0.083% nebulizer solution Take 2.5 mg by nebulization every 4 (four) hours as needed for wheezing or shortness of breath.    [provider]  alfuzosin (UROXATRAL) 10 MG 24 hr tablet Take 10 mg by mouth daily.  [provider]  amLODipine (NORVASC) 10 MG tablet Take 10 mg by mouth daily.    [provider]  aspirin 81 MG tablet Take 81 mg by mouth daily.    [provider]  baclofen (LIORESAL) 10 MG tablet Take 10 mg by mouth 3 (three) times daily.    [provider]  beclomethasone (QVAR) 80 MCG/ACT inhaler Inhale 2 puffs into the lungs 2 (two) times daily.     [provider]  Buprenorphine (BUTRANS) 15 MCG/HR PTWK Place 15 mg onto the skin once a week. Saturday    [provider]  chlorthalidone (HYGROTON) 25 MG tablet Take 1 tablet (25 mg total) by  mouth daily as needed. 08/25/16   Antonieta IbaGollan, Timothy J, MD  citalopram (CELEXA) 20 MG tablet Take 20 mg by mouth daily.    [provider]  cloNIDine (CATAPRES - DOSED IN MG/24 HR) 0.1 mg/24hr patch Place 0.1 mg onto the skin once a week. saturday    [provider]  diclofenac sodium (VOLTAREN) 1 % GEL Apply 2-4 g topically 4 (four) times daily as needed. For pain.    [provider]  dicyclomine (BENTYL) 20 MG tablet Take 20 mg by mouth every 6 (six) hours.    [provider]  DOCOSAHEXAENOIC ACID PO Take 1 tablet by mouth daily.     [provider]  famotidine (PEPCID) 20 MG tablet Take 20 mg by mouth 2 (two) times daily.  01/03/16   [provider]  finasteride (PROSCAR) 5 MG tablet Take 5 mg by mouth daily.    [provider]  fluticasone (FLONASE) 50 MCG/ACT nasal spray Place 2 sprays into the nose daily.    [provider]  hydrALAZINE (APRESOLINE) 100 MG tablet take 1 tablet by mouth three times a day Patient taking differently: take 0.5 tablet by mouth three times a day 02/08/16   Antonieta IbaGollan, Timothy J, MD  hydrocortisone cream 0.5 % Apply 1 application topically 2 (two) times daily.    [provider]  HYDROmorphone (DILAUDID) 4 MG tablet Take 1 tablet (4 mg total) by mouth every 3 (three) hours as needed. Patient taking differently: Take 4 mg by mouth 2 (two) times daily as needed.  02/26/16   Willy Eddyobinson, Patrick, MD  ibuprofen (ADVIL,MOTRIN) 400 MG tablet Take 400 mg by mouth every 6 (six) hours as needed.    [provider]  Insulin Degludec (TRESIBA FLEXTOUCH) 200 UNIT/ML SOPN Inject 100 Units into the skin daily at 10 pm.    [provider]  isosorbide mononitrate (IMDUR) 30 MG 24 hr tablet take 1 tablet by mouth twice a day 08/30/16   Antonieta IbaGollan, Timothy J, MD  ketoconazole (NIZORAL) 2 % shampoo Apply 1 application topically 2 (two) times a week. tues and thursday 08/27/14   [provider]   lisinopril (PRINIVIL,ZESTRIL) 40 MG tablet Take 40 mg by mouth daily.    [provider]  loratadine (CLARITIN) 10 MG tablet Take 10 mg by mouth daily.    [provider]  metoprolol (LOPRESSOR) 100 MG tablet Take 1.5 tablets (150 mg total) by mouth 2 (two) times daily. Patient taking differently: Take 100 mg by mouth 2 (two) times daily.  08/12/13   Antonieta IbaGollan, Timothy J, MD  mirtazapine (REMERON) 45 MG tablet Take 45 mg by mouth at bedtime.    [provider]  Multiple Vitamin (MULTIVITAMIN) tablet Take 1 tablet by mouth daily.    [provider]  naloxone Jonelle Sports(NARCAN)  nasal spray 4 mg/0.1 mL One spray in either nostril once for known/suspected opioid overdose. May repeat every 2-3 minutes in alternating nostril til EMS arrives 01/12/16   [provider]  nitroGLYCERIN (NITROSTAT) 0.4 MG SL tablet Place 1 tablet (0.4 mg total) under the tongue every 5 (five) minutes as needed for chest pain. Call 911 if chest is not resolved w/ 3 tabs. 11/02/14   Antonieta Iba, MD  nortriptyline (PAMELOR) 25 MG capsule Take 50 mg by mouth at bedtime.    [provider]  NOVOLOG FLEXPEN 100 UNIT/ML FlexPen Inject 10 Units into the skin See admin instructions. With every meal and every snack 12/13/15   [provider]  nystatin (MYCOSTATIN) powder Apply topically 3 (three) times daily. For 10 days 05/19/15   Renford Dills, NP  nystatin-triamcinolone [facility] II) cream Apply 1 application topically 2 (two) times daily.    [provider]  omeprazole (PRILOSEC) 20 MG capsule Take 20 mg by mouth daily.    [provider]  ondansetron (ZOFRAN-ODT) 4 MG disintegrating tablet Take 4 mg by mouth every 8 (eight) hours as needed for nausea or vomiting.    [provider]  polyethylene glycol (MIRALAX / GLYCOLAX) packet Take 17 g by mouth 3 (three) times daily.     [provider]  potassium chloride SA (K-DUR,KLOR-CON) 20 MEQ tablet  take 1 tablet by mouth once daily if needed Patient taking differently: take 1 tablet by mouth once daily 02/08/16   Antonieta Iba, MD  pravastatin (PRAVACHOL) 80 MG tablet take 1 tablet by mouth at bedtime 04/17/16   Antonieta Iba, MD  prednisoLONE acetate (PRED FORTE) 1 % ophthalmic suspension Place 1 drop into the right eye daily.     [provider]  RA MELATONIN 1 MG SUBL Take 1 mg by mouth at bedtime.  12/27/15   [provider]  solifenacin (VESICARE) 10 MG tablet Take 10 mg by mouth daily.    [provider]  spironolactone (ALDACTONE) 25 MG tablet Take 25 mg by mouth daily.    [provider]  tamsulosin (FLOMAX) 0.4 MG CAPS Take 0.4 mg by mouth daily.    [provider]  tiZANidine (ZANAFLEX) 4 MG capsule Take 4 mg by mouth 3 (three) times daily as needed.  01/12/16 01/11/17  [provider]  zolpidem (AMBIEN) 10 MG tablet Take 10 mg by mouth at bedtime.    [provider]     Allergies Penicillins; Erythromycin base; and Erythromycin base   Family History  Problem Relation Age of Onset  . Family history unknown: Yes    Social History Social History  Substance Use Topics  . Smoking status: Current Every Day Smoker    Years: 0.00  . Smokeless tobacco: Never Used     Comment: quit smoking friday 08/18/2016  . Alcohol use No     Comment: used to be moderate    Review of Systems  Constitutional:   No fever or chills.  ENT:   No sore throat. No rhinorrhea. Cardiovascular:   Positive as above chest pain without syncope. Respiratory:   No dyspnea or cough. Gastrointestinal:   Negative for abdominal pain, vomiting and diarrhea.  Musculoskeletal:   Negative for focal pain or swelling All other systems reviewed and are negative except as documented above in ROS and HPI.  ____________________________________________   PHYSICAL EXAM:  VITAL SIGNS: ED Triage Vitals  Enc Vitals Group     BP 10/06/16  1337  (!) 153/115     Pulse Rate 10/06/16 1337 93     Resp 10/06/16 1337 16     Temp 10/06/16 1337 98.3 F (36.8 C)     Temp Source 10/06/16 1337 Oral     SpO2 10/06/16 1335 98 %     Weight 10/06/16 1338 169 lb (76.7 kg)     Height 10/06/16 1338 5\' 4"  (1.626 m)     Head Circumference --      Peak Flow --      Pain Score 10/06/16 1337 10     Pain Loc --      Pain Edu? --      Excl. in GC? --     Vital signs reviewed, nursing assessments reviewed.   Constitutional:   Alert and oriented.Uncomfortable but not in distress. Eyes:   No scleral icterus.  EOMI. No nystagmus. No conjunctival pallor. PERRL. ENT   Head:   Normocephalic and atraumatic.   Nose:   No congestion/rhinnorhea.    Mouth/Throat:   MMM, no pharyngeal erythema. No peritonsillar mass. Congenital jaw malformation   Neck:   No meningismus. Full ROM Hematological/Lymphatic/Immunilogical:   No cervical lymphadenopathy. Cardiovascular:   RRR. Symmetric bilateral radial  pulses. Nonpalpable femoral pulses, good right dorsalis pedis pulse.  No murmurs. Normal distal capillary refill Respiratory:   Normal respiratory effort without tachypnea/retractions. Breath sounds are clear and equal bilaterally. No wheezes/rales/rhonchi. Gastrointestinal:   Soft and nontender. Non distended. There is no CVA tenderness.  No rebound, rigidity, or guarding. Colostomy in place with unremarkable liquid stool in bag Genitourinary:   deferred Musculoskeletal:   Normal range of motion in all extremities. No joint effusions.  No lower extremity tenderness.  No edema. Nontender, extremities warm Neurologic:   Normal speech and language.  Motor grossly intact. No gross focal neurologic deficits are appreciated.  Skin:    Skin is warm, dry and intact. No rash noted.  No petechiae, purpura, or bullae.  ____________________________________________    LABS (pertinent positives/negatives) (all labs ordered are listed, but only abnormal results  are displayed) Labs Reviewed  COMPREHENSIVE METABOLIC PANEL - Abnormal; Notable for the following:       Result Value   Glucose, Bld 108 (*)    ALT 16 (*)    All other components within normal limits  CBC WITH DIFFERENTIAL/PLATELET - Abnormal; Notable for the following:    WBC 11.0 (*)    RDW 15.2 (*)    Eosinophils Absolute 1.7 (*)    All other components within normal limits  URINALYSIS, COMPLETE (UACMP) WITH MICROSCOPIC - Abnormal; Notable for the following:    Color, Urine YELLOW (*)    APPearance CLEAR (*)    Squamous Epithelial / LPF 0-5 (*)    All other components within normal limits  URINE DRUG SCREEN, QUALITATIVE ([facility] ONLY) - Abnormal; Notable for the following:    Tricyclic, Ur Screen POSITIVE (*)    All other components within normal limits  ETHANOL  TROPONIN I   ____________________________________________   EKG  Interpreted by me Sinus rhythm rate 91. Normal axis. Slightly prolonged QTC of 525. Normal ST segments and T waves. ____________________________________________    RADIOLOGY  Dg Chest Portable 1 View  Result Date: 10/06/2016 CLINICAL DATA:  Acute chest pain today. EXAM: PORTABLE CHEST 1 VIEW COMPARISON:  07/30/2016 and prior studies FINDINGS: Upper limits normal heart size noted. There is no evidence of focal airspace disease, pulmonary edema, suspicious pulmonary nodule/mass, pleural effusion, or pneumothorax.  No acute bony abnormalities are identified. IMPRESSION: No active disease. Electronically Signed   By: Harmon Pier M.D.   On: 10/06/2016 14:12   Ct Angio Chest Aorta W And/or Wo Contrast  Result Date: 10/06/2016 CLINICAL DATA:  Lambert Mody, central chest pain since this morning. Shortness of breath. Nausea. EXAM: CT ANGIOGRAPHY CHEST WITH CONTRAST TECHNIQUE: Multidetector CT imaging of the chest was performed using the standard protocol during bolus administration of intravenous contrast. Multiplanar CT image reconstructions and MIPs were obtained to  evaluate the vascular anatomy. CONTRAST:  100 cc Isovue 370 COMPARISON:  Portable chest obtained earlier today and chest CTA dated 07/30/2016. FINDINGS: Cardiovascular: Normally opacified and normal appearing thoracic aorta without aneurysm or dissection. Small pericardial effusion with a maximum thickness of 9 mm. Normal sized heart. Mediastinum/Nodes: No enlarged mediastinal, hilar, or axillary lymph nodes. Thyroid gland, trachea, and esophagus demonstrate no significant findings. Lungs/Pleura: Minimal dependent atelectasis at the lung bases. No lung nodules or pleural fluid. Upper Abdomen: Multiple small gallstones in the gallbladder measuring up to 2 mm in maximum diameter each. No gallbladder wall thickening or pericholecystic fluid seen. Musculoskeletal: Mild scoliosis. Mildly elevated left hemidiaphragm. Mild lower cervical spine degenerative changes. Review of the MIP images confirms the above findings. IMPRESSION: 1. No aortic aneurysm or dissection. 2. Small pericardial effusion. 3. Cholelithiasis. Electronically Signed   By: Beckie Salts M.D.   On: 10/06/2016 15:01    ____________________________________________   PROCEDURES Procedures  ____________________________________________   INITIAL IMPRESSION / ASSESSMENT AND PLAN / ED COURSE  Pertinent labs & imaging results that were available during my care of the patient were reviewed by me and considered in my medical decision making (see chart for details).  Patient presents with chest pain concerning for ACS versus dissection. Low suspicion for airspace disease or pneumothorax or PE. Unlikely infection, patient is not septic. Currently hemodynamically stable. Given the somewhat unusual clinical exam vascular findings that failed to provide reassurance about full symmetric distal blood flow, will need a CT angiogram of the chest. He had a CTA to evaluate for PE about 2 months ago which was negative, but it's worth repeating today targeting  to the aorta.  Clinical Course as of Oct 06 1709  Fri Oct 06, 2016  1456 No relief with 3 nitros. Will start nitro drip, dilaudid  [PS]  1535 Abdomen reassessed. Completely nontender. Ongoing chest pain.   [PS]  1703 Pain improved to 8/10 on nitro drip  [PS]    Clinical Course User Index [PS] Sharman Cheek, MD     ----------------------------------------- 5:11 PM on 10/06/2016 -----------------------------------------  Discussed with hospitalist for further eval of pt with refractory chest pain and significant underlying disease.does not have a history of escalating symptoms, not clinically consistent with unstable angina at this time.  ____________________________________________   FINAL CLINICAL IMPRESSION(S) / ED DIAGNOSES  Final diagnoses:  Nonspecific chest pain       New Prescriptions   No medications on file     Portions of this note were generated with dragon dictation software. Dictation errors may occur despite best attempts at proofreading.    Sharman Cheek, MD 10/06/16 [phone]

## 2016-10-06 NOTE — ED Notes (Signed)
Patient transported to CT 

## 2016-10-06 NOTE — H&P (Signed)
[facility] - [place] at [facility]   PATIENT NAME: Cory Campbell    MR#:  [id number]  DATE OF BIRTH:  [birth year]  DATE OF ADMISSION:  10/06/2016  PRIMARY CARE PHYSICIAN: Emogene Morgan, MD   REQUESTING/REFERRING PHYSICIAN: Scotty Court  CHIEF COMPLAINT:  chest pain  HISTORY OF PRESENT ILLNESS:  [name]  is a 36 y.o. male with a known history of obstructive sleep apnea, coronary artery disease, chronic pain syndrome arthrogryposis, insulin requiring diabetes mellitus, essential hypertension and multiple other medical problems is presenting to the ED with a chief complaint of intractablechest pain. Patient started having left-sided chest pain in the inframammary area 12 noon which was radiating to the left shoulder and associated with shortness of breath. Sublingual nitroglycerin in the ED did not relieve the pain so patient was started on nitroglycerin drip. Initial troponin is negative. EKG with no significant changes After nitroglycerin drip is started patient's pain was better  PAST MEDICAL HISTORY:   Past Medical History:  Diagnosis Date  . Abrasion or friction burn of foot and toe(s), without mention of infection   . Acid reflux   . Adopted   . Allergic rhinitis, cause unspecified   . Anxiety   . Arthritis   . Arthrogryposis   . Asthma   . Bladder wall thickening   . BPH (benign prostatic hyperplasia)   . Chest pain, unspecified   . Chronic pain syndrome   . Depressive disorder, not elsewhere classified   . Diabetes mellitus without complication (HCC)   . Diverticulitis of colon (without mention of hemorrhage)(562.11)   . HTN (hypertension)   . Hyperlipidemia   . Hypogonadism in male   . Myalgia and myositis, unspecified   . OAB (overactive bladder)   . Obesity   . Other and unspecified noninfectious gastroenteritis and colitis(558.9)   . Other convulsions   . Other psoriasis   . 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  Premature baby    born premature  . Sleep apnea   . 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 or other cause, without mention of thyrotoxic crisis or storm    hyperthyroidism  . TIA (transient ischemic attack)   . Type II or unspecified type diabetes mellitus with neurological manifestations, not stated as uncontrolled(250.60)     PAST SURGICAL HISTOIRY:   Past Surgical History:  Procedure Laterality Date  . APPENDECTOMY    . CORNEAL TRANSPLANT    . feet surgery     both  . HAND SURGERY     left and right  . leg surgery     left and right  . ORTHOPEDIC SURGERY     hands, feet, knees, legs  . tubes in ears     both    SOCIAL HISTORY:   Social History  Substance Use Topics  . Smoking status: Current Every Day Smoker    Years: 0.00  . Smokeless tobacco: Never Used     Comment: quit smoking friday 08/18/2016  . Alcohol use No     Comment: used to be moderate    FAMILY HISTORY:   Family History  Problem Relation Age of Onset  . Family history unknown: Yes    DRUG ALLERGIES:   Allergies  Allergen Reactions  . Penicillins Itching, Rash and Hives    Has patient had a PCN reaction causing immediate rash, facial/tongue/throat swelling, SOB or lightheadedness with hypotension: Yes Has patient had a PCN reaction causing severe rash involving mucus membranes or skin  necrosis: No Has patient had a PCN reaction that required hospitalization No Has patient had a PCN reaction occurring within the last 10 years: No If all of the above answers are "NO", then may proceed with Cephalosporin use. Has patient had a PCN reaction causing immediate rash, facial/tongue/throat swelling, SOB or lightheadedness with hypotension: Yes Has patient had a PCN reaction causing severe rash involving mucus membranes or skin necrosis: No Has patient had a PCN reaction that required hospitalization No Has patient had a PCN reaction occurring within the last 10 years: No If all of the above answers  are "NO", then may proceed with Cephalosporin use.  . Erythromycin Base Itching and Rash  . Erythromycin Base Itching and Rash    REVIEW OF SYSTEMS:  CONSTITUTIONAL: No fever, fatigue or weakness.  EYES: No blurred or double vision.  EARS, NOSE, AND THROAT: No tinnitus or ear pain.  RESPIRATORY: No cough, shortness of breath, wheezing or hemoptysis.  CARDIOVASCULAR:reporting left-sided chest pain and shortness of breath. No orthopnea, edema.  GASTROINTESTINAL: No nausea, vomiting, diarrhea or abdominal pain.  GENITOURINARY: No dysuria, hematuria.  ENDOCRINE: No polyuria, nocturia,  HEMATOLOGY: No anemia, easy bruising or bleeding SKIN: No rash or lesion. MUSCULOSKELETAL: No joint pain or arthritis.   NEUROLOGIC: No tingling, numbness, weakness.  PSYCHIATRY: No anxiety or depression.   MEDICATIONS AT HOME:   Prior to Admission medications   Medication Sig Start Date End Date Taking? Authorizing Provider  acyclovir (ZOVIRAX) 400 MG tablet Take 400 mg by mouth 2 (two) times daily.   Yes [provider]  albuterol (PROVENTIL HFA;VENTOLIN HFA) 108 (90 BASE) MCG/ACT inhaler Inhale 2 puffs into the lungs every 4 (four) hours as needed for wheezing or shortness of breath.    Yes [provider]  albuterol (PROVENTIL) (2.5 MG/3ML) 0.083% nebulizer solution Take 2.5 mg by nebulization every 4 (four) hours as needed for wheezing or shortness of breath.   Yes [provider]  alfuzosin (UROXATRAL) 10 MG 24 hr tablet Take 10 mg by mouth daily.   Yes [provider]  amLODipine (NORVASC) 10 MG tablet Take 10 mg by mouth daily.   Yes [provider]  aspirin 81 MG tablet Take 81 mg by mouth daily.   Yes [provider]  baclofen (LIORESAL) 10 MG tablet Take 10 mg by mouth 3 (three) times daily.   Yes [provider]  beclomethasone (QVAR) 80 MCG/ACT inhaler Inhale 2 puffs into the lungs 2 (two) times daily.    Yes [provider]   Buprenorphine (BUTRANS) 15 MCG/HR PTWK Place 15 mg onto the skin once a week. Saturday   Yes [provider]  chlorthalidone (HYGROTON) 25 MG tablet Take 1 tablet (25 mg total) by mouth daily as needed. 08/25/16  Yes Gollan, Tollie Pizza, MD  citalopram (CELEXA) 20 MG tablet Take 20 mg by mouth daily.   Yes [provider]  cloNIDine (CATAPRES - DOSED IN MG/24 HR) 0.1 mg/24hr patch Place 0.1 mg onto the skin once a week. saturday   Yes [provider]  diclofenac sodium (VOLTAREN) 1 % GEL Apply 2-4 g topically 4 (four) times daily as needed. For pain.   Yes [provider]  dicyclomine (BENTYL) 20 MG tablet Take 20 mg by mouth every 6 (six) hours.   Yes [provider]  DOCOSAHEXAENOIC ACID PO Take 1 tablet by mouth daily.    Yes [provider]  famotidine (PEPCID) 20 MG tablet Take 20 mg by  mouth 2 (two) times daily.  01/03/16  Yes [provider]  finasteride (PROSCAR) 5 MG tablet Take 5 mg by mouth daily.   Yes [provider]  fluticasone (FLONASE) 50 MCG/ACT nasal spray Place 2 sprays into the nose daily.   Yes [provider]  hydrALAZINE (APRESOLINE) 100 MG tablet take 1 tablet by mouth three times a day Patient taking differently: take 0.5 tablet by mouth three times a day 02/08/16  Yes Gollan, Tollie Pizza, MD  hydrocortisone cream 0.5 % Apply 1 application topically 2 (two) times daily.   Yes [provider]  HYDROmorphone (DILAUDID) 2 MG tablet Take 2 mg by mouth every 12 (twelve) hours. 08/28/16  Yes [provider]  HYDROmorphone (DILAUDID) 4 MG tablet Take 1 tablet (4 mg total) by mouth every 3 (three) hours as needed. Patient taking differently: Take 4 mg by mouth 2 (two) times daily as needed.  02/26/16  Yes Willy Eddy, MD  ibuprofen (ADVIL,MOTRIN) 400 MG tablet Take 400 mg by mouth every 6 (six) hours as needed.   Yes [provider]  Insulin Degludec (TRESIBA FLEXTOUCH) 200  UNIT/ML SOPN Inject 100 Units into the skin daily at 10 pm.   Yes [provider]  isosorbide mononitrate (IMDUR) 30 MG 24 hr tablet take 1 tablet by mouth twice a day 08/30/16  Yes Gollan, Tollie Pizza, MD  ketoconazole (NIZORAL) 2 % shampoo Apply 1 application topically 2 (two) times a week. tues and thursday 08/27/14  Yes [provider]  lisinopril (PRINIVIL,ZESTRIL) 40 MG tablet Take 40 mg by mouth daily.   Yes [provider]  loratadine (CLARITIN) 10 MG tablet Take 10 mg by mouth daily.   Yes [provider]  metoprolol (LOPRESSOR) 100 MG tablet Take 1.5 tablets (150 mg total) by mouth 2 (two) times daily. Patient taking differently: Take 100 mg by mouth 2 (two) times daily.  08/12/13  Yes Antonieta Iba, MD  mirtazapine (REMERON) 45 MG tablet Take 45 mg by mouth at bedtime.   Yes [provider]  Multiple Vitamin (MULTIVITAMIN) tablet Take 1 tablet by mouth daily.   Yes [provider]  naloxone [facility]) nasal spray 4 mg/0.1 mL One spray in either nostril once for known/suspected opioid overdose. May repeat every 2-3 minutes in alternating nostril til EMS arrives 01/12/16  Yes [provider]  nitroGLYCERIN (NITROSTAT) 0.4 MG SL tablet Place 1 tablet (0.4 mg total) under the tongue every 5 (five) minutes as needed for chest pain. Call 911 if chest is not resolved w/ 3 tabs. 11/02/14  Yes Antonieta Iba, MD  nortriptyline (PAMELOR) 25 MG capsule Take 50 mg by mouth at bedtime.   Yes [provider]  NOVOLOG FLEXPEN 100 UNIT/ML FlexPen Inject 10 Units into the skin See admin instructions. With every meal and every snack 12/13/15  Yes [provider]  nystatin-triamcinolone (MYCOLOG II) cream Apply 1 application topically 2 (two) times daily.   Yes [provider]  omeprazole (PRILOSEC) 20 MG capsule Take 20 mg by mouth daily.   Yes [provider]  ondansetron (ZOFRAN-ODT) 4 MG disintegrating tablet  Take 4 mg by mouth every 8 (eight) hours as needed for nausea or vomiting.   Yes [provider]  polyethylene glycol (MIRALAX / GLYCOLAX) packet Take 17 g by mouth 3 (three) times daily.    Yes [provider]  potassium chloride SA (K-DUR,KLOR-CON) 20 MEQ tablet take 1 tablet by mouth once daily if needed  Patient taking differently: take 1 tablet by mouth once daily 02/08/16  Yes Gollan, Tollie Pizzaimothy J, MD  pravastatin (PRAVACHOL) 80 MG tablet take 1 tablet by mouth at bedtime 04/17/16  Yes Gollan, Tollie Pizzaimothy J, MD  prednisoLONE acetate (PRED FORTE) 1 % ophthalmic suspension Place 1 drop into the right eye daily.    Yes [provider]  RA MELATONIN 1 MG SUBL Take 1 mg by mouth at bedtime.  12/27/15  Yes [provider]  solifenacin (VESICARE) 10 MG tablet Take 10 mg by mouth daily.   Yes [provider]  spironolactone (ALDACTONE) 25 MG tablet Take 25 mg by mouth daily.   Yes [provider]  tamsulosin (FLOMAX) 0.4 MG CAPS Take 0.4 mg by mouth daily.   Yes [provider]  tiZANidine (ZANAFLEX) 4 MG capsule Take 4 mg by mouth 3 (three) times daily as needed.  01/12/16 01/11/17 Yes [provider]  zolpidem (AMBIEN) 10 MG tablet Take 10 mg by mouth at bedtime.   Yes [provider]  nystatin (MYCOSTATIN) powder Apply topically 3 (three) times daily. For 10 days Patient not taking: Reported on 10/06/2016 05/19/15   Renford DillsMiller, Lindsey, NP      VITAL SIGNS:  Blood pressure (!) 140/107, pulse 87, temperature 98.3 F (36.8 C), temperature source Oral, resp. rate 20, height 5\' 4"  (1.626 m), weight 76.7 kg (169 lb), SpO2 98 %.  PHYSICAL EXAMINATION:  GENERAL:  36 y.o.-year-old patient lying in the bed with no acute distress.  EYES: Pupils equal, round, reactive to light and accommodation. No scleral icterus. Extraocular muscles intact.  HEENT: Head atraumatic, normocephalic. Oropharynx and nasopharynx clear.  NECK:  Supple, no  jugular venous distention. No thyroid enlargement, no tenderness.  LUNGS: Normal breath sounds bilaterally, no wheezing, rales,rhonchi or crepitation. No use of accessory muscles of respiration.  CARDIOVASCULAR: S1, S2 normal. No murmurs, rubs, or gallops. Reproducible left-sided chest wall tenderness on palpation ABDOMEN: Soft, nontender, nondistended. Bowel sounds present. Chronic colostomy EXTREMITIES: congenital abnormality , ambulates with crutches.No pedal edema, cyanosis, or clubbing.  NEUROLOGIC: Cranial nerves II through XII are intact. Muscle strength 5/5 in all extremities. Sensation intact. Gait not checked.  PSYCHIATRIC: The patient is alert and oriented x 3.  SKIN: No obvious rash, lesion, or ulcer.   LABORATORY PANEL:   CBC  Recent Labs Lab 10/06/16 1343  WBC 11.0*  HGB 13.7  HCT 41.4  PLT 237   ------------------------------------------------------------------------------------------------------------------  Chemistries   Recent Labs Lab 10/06/16 1343  NA 138  K 3.5  CL 102  CO2 26  GLUCOSE 108*  BUN 9  CREATININE 0.75  CALCIUM 9.6  AST 21  ALT 16*  ALKPHOS 82  BILITOT 0.5   ------------------------------------------------------------------------------------------------------------------  Cardiac Enzymes  Recent Labs Lab 10/06/16 1343  TROPONINI <0.03   ------------------------------------------------------------------------------------------------------------------  RADIOLOGY:  Dg Chest Portable 1 View  Result Date: 10/06/2016 CLINICAL DATA:  Acute chest pain today. EXAM: PORTABLE CHEST 1 VIEW COMPARISON:  07/30/2016 and prior studies FINDINGS: Upper limits normal heart size noted. There is no evidence of focal airspace disease, pulmonary edema, suspicious pulmonary nodule/mass, pleural effusion, or pneumothorax. No acute bony abnormalities are identified. IMPRESSION: No active disease. Electronically Signed   By: Harmon PierJeffrey  Hu M.D.   On:  10/06/2016 14:12   Ct Angio Chest Aorta W And/or Wo Contrast  Result Date: 10/06/2016 CLINICAL DATA:  Lambert ModySharp, central chest pain since this morning. Shortness of breath. Nausea. EXAM: CT ANGIOGRAPHY CHEST WITH CONTRAST TECHNIQUE: Multidetector CT imaging of  the chest was performed using the standard protocol during bolus administration of intravenous contrast. Multiplanar CT image reconstructions and MIPs were obtained to evaluate the vascular anatomy. CONTRAST:  100 cc Isovue 370 COMPARISON:  Portable chest obtained earlier today and chest CTA dated 07/30/2016. FINDINGS: Cardiovascular: Normally opacified and normal appearing thoracic aorta without aneurysm or dissection. Small pericardial effusion with a maximum thickness of 9 mm. Normal sized heart. Mediastinum/Nodes: No enlarged mediastinal, hilar, or axillary lymph nodes. Thyroid gland, trachea, and esophagus demonstrate no significant findings. Lungs/Pleura: Minimal dependent atelectasis at the lung bases. No lung nodules or pleural fluid. Upper Abdomen: Multiple small gallstones in the gallbladder measuring up to 2 mm in maximum diameter each. No gallbladder wall thickening or pericholecystic fluid seen. Musculoskeletal: Mild scoliosis. Mildly elevated left hemidiaphragm. Mild lower cervical spine degenerative changes. Review of the MIP images confirms the above findings. IMPRESSION: 1. No aortic aneurysm or dissection. 2. Small pericardial effusion. 3. Cholelithiasis. Electronically Signed   By: Beckie Salts M.D.   On: 10/06/2016 15:01    EKG:   Orders placed or performed during the hospital encounter of 10/06/16  . ED EKG  . ED EKG  . EKG 12-Lead  . EKG 12-Lead    IMPRESSION AND PLAN:  Reyansh Kushnir  is a 36 y.o. male with a known history of obstructive sleep apnea, coronary artery disease, chronic pain syndrome arthrogryposis, insulin requiring diabetes mellitus, essential hypertension and multiple other medical problems is presenting to  the ED with a chief complaint of intractablechest pain. Patient started having left-sided chest pain in the inframammary area 12 noon which was radiating to the left shoulder and associated with shortness of breath. Sublingual nitroglycerin in the ED did not relieve the pain so patient was started on nitroglycerin drip. Initial troponin is negative.   # UNSTABLE ANGINA with history of coronary artery disease Admit to telemetry Initial troponin is negative cycle cardiac biomarkers Repeat EKG in a.m. Get echocardiogram Nothing by mouth after midnight Consult to Dr. 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 Patient is agreeable to nicotine patch. We'll start nicotine patch    DVT prophylaxis with Lovenox subcutaneous     All the records are reviewed and case discussed with ED provider. Management plans discussed with the patient, family and they are in agreement.  CODE STATUS: fc , wifE [name] HCPOA  TOTAL TIME TAKING CARE OF THIS PATIENT: 45 minutes.   Note: This dictation was prepared with Dragon dictation along with smaller phrase technology. Any transcriptional errors that result from this process are unintentional.  Ramonita Lab M.D on 10/06/2016 at 6:37 PM  Between 7am to 6pm - Pager - [phone]  After 6pm go to [email or web address] - password [email or web address]  [facility]  [facility]  Office  [phone]  CC: Primary care physician; Emogene Morgan, MD

## 2016-10-07 ENCOUNTER — Observation Stay: Admit: 2016-10-07 | Payer: Medicaid Other

## 2016-10-07 DIAGNOSIS — R079 Chest pain, unspecified: Secondary | ICD-10-CM | POA: Diagnosis not present

## 2016-10-07 LAB — TROPONIN I
Troponin I: <0.03 ng/mL (ref <0.03)
Troponin I: <0.03 ng/mL (ref <0.03)

## 2016-10-07 LAB — GLUCOSE, CAPILLARY
GLUCOSE-CAPILLARY: 119 mg/dL — AB (ref 65–99)
GLUCOSE-CAPILLARY: 152 mg/dL — AB (ref 65–99)
Glucose-Capillary: 228 mg/dL — ABNORMAL HIGH (ref 65–99)
Glucose-Capillary: 164 mg/dL — ABNORMAL HIGH (ref 65–99)

## 2016-10-07 LAB — CBC
HCT: 35.6 % — ABNORMAL LOW (ref 40.0–52.0)
Hemoglobin: 11.9 g/dL — ABNORMAL LOW (ref 13.0–18.0)
MCH: 29.2 pg (ref 26.0–34.0)
MCHC: 33.5 g/dL (ref 32.0–36.0)
MCV: 87.1 fL (ref 80.0–100.0)
PLATELETS: 227 10*3/uL (ref 150–440)
RBC: 4.08 MIL/uL — ABNORMAL LOW (ref 4.40–5.90)
RDW: 15.6 % — ABNORMAL HIGH (ref 11.5–14.5)
WBC: 11 10*3/uL — ABNORMAL HIGH (ref 3.8–10.6)

## 2016-10-07 LAB — HEMOGLOBIN A1C
Hgb A1c MFr Bld: 7 % — ABNORMAL HIGH (ref 4.8–5.6)
MEAN PLASMA GLUCOSE: 154.2 mg/dL

## 2016-10-07 LAB — BASIC METABOLIC PANEL
ANION GAP: 7 (ref 5–15)
BUN: 9 mg/dL (ref 6–20)
CHLORIDE: 105 mmol/L (ref 101–111)
CO2: 27 mmol/L (ref 22–32)
Calcium: 8.3 mg/dL — ABNORMAL LOW (ref 8.9–10.3)
Creatinine, Ser: 0.58 mg/dL — ABNORMAL LOW (ref 0.61–1.24)
GFR calc non Af Amer: >60 mL/min (ref >60)
Glucose, Bld: 255 mg/dL — ABNORMAL HIGH (ref 65–99)
POTASSIUM: 3.8 mmol/L (ref 3.5–5.1)
Sodium: 139 mmol/L (ref 135–145)

## 2016-10-07 LAB — LIPID PANEL
CHOLESTEROL: 163 mg/dL (ref 0–200)
HDL: 26 mg/dL — ABNORMAL LOW (ref >40)
LDL Cholesterol: 112 mg/dL — ABNORMAL HIGH (ref 0–99)
Total CHOL/HDL Ratio: 6.3 RATIO
Triglycerides: 126 mg/dL (ref <150)
VLDL: 25 mg/dL (ref 0–40)

## 2016-10-07 LAB — MAGNESIUM: MAGNESIUM: 1.4 mg/dL — AB (ref 1.7–2.4)

## 2016-10-07 MED ORDER — HYDROMORPHONE HCL 1 MG/ML IJ SOLN
1.0000 mg | Freq: Once | INTRAMUSCULAR | Status: AC
  Administered 2016-10-07: 1 mg via INTRAVENOUS
  Filled 2016-10-07: qty 1

## 2016-10-07 MED ORDER — IBUPROFEN 400 MG PO TABS
200.0000 mg | ORAL_TABLET | Freq: Three times a day (TID) | ORAL | Status: DC
  Administered 2016-10-07 – 2016-10-10 (×8): 200 mg via ORAL
  Filled 2016-10-07 (×8): qty 1

## 2016-10-07 MED ORDER — MAGNESIUM SULFATE 2 GM/50ML IV SOLN
2.0000 g | Freq: Once | INTRAVENOUS | Status: AC
  Administered 2016-10-07: 2 g via INTRAVENOUS
  Filled 2016-10-07: qty 50

## 2016-10-07 MED ORDER — DIPHENHYDRAMINE HCL 25 MG PO CAPS: 25.0000 mg | ORAL_CAPSULE | Freq: Four times a day (QID) | ORAL | Status: DC | PRN

## 2016-10-07 MED ORDER — DIPHENHYDRAMINE HCL 25 MG PO CAPS
25.0000 mg | ORAL_CAPSULE | Freq: Once | ORAL | Status: AC
Start: 2016-10-07 — End: 2016-10-07
  Administered 2016-10-07: 25 mg via ORAL
  Filled 2016-10-07: qty 1

## 2016-10-07 MED ORDER — BUDESONIDE 0.5 MG/2ML IN SUSP
2.0000 mL | Freq: Two times a day (BID) | RESPIRATORY_TRACT | Status: DC
  Administered 2016-10-07 – 2016-10-10 (×5): 0.5 mg via RESPIRATORY_TRACT
  Filled 2016-10-07 (×7): qty 2

## 2016-10-07 MED ORDER — ALUM & MAG HYDROXIDE-SIMETH 200-200-20 MG/5ML PO SUSP
30.0000 mL | Freq: Four times a day (QID) | ORAL | Status: DC | PRN
  Administered 2016-10-07: 30 mL via ORAL
  Filled 2016-10-07 (×2): qty 30

## 2016-10-07 MED ORDER — NITROGLYCERIN IN D5W 200-5 MCG/ML-% IV SOLN
0.0000 ug/min | INTRAVENOUS | Status: DC
  Filled 2016-10-07: qty 250

## 2016-10-07 NOTE — Progress Notes (Signed)
[facility] [place] [facility]   PATIENT NAME: Cory Campbell    MR#:  [id number]  DATE OF BIRTH:  [birth year]  SUBJECTIVE:  CHIEF COMPLAINT:   Chief Complaint  Patient presents with  . Chest Pain     Came with chest pain, troponins stable, pain continues.   He said nitro drip helps a little.   Seen by cardiologist, and he suggested to stop nitro drip.   Pt is almost moaning due to pain.  REVIEW OF SYSTEMS:   CONSTITUTIONAL: No fever, fatigue or weakness.  EYES: No blurred or double vision.  EARS, NOSE, AND THROAT: No tinnitus or ear pain.  RESPIRATORY: No cough, shortness of breath, wheezing or hemoptysis.  CARDIOVASCULAR:reporting left-sided chest pain and shortness of breath. No orthopnea, edema.  GASTROINTESTINAL: No nausea, vomiting, diarrhea or abdominal pain.  GENITOURINARY: No dysuria, hematuria.  ENDOCRINE: No polyuria, nocturia,  HEMATOLOGY: No anemia, easy bruising or bleeding SKIN: No rash or lesion. MUSCULOSKELETAL: No joint pain or arthritis.   NEUROLOGIC: No tingling, numbness, weakness.  PSYCHIATRY: No anxiety or depression.  ROS  DRUG ALLERGIES:   Allergies  Allergen Reactions  . Penicillins Itching, Rash and Hives    Has patient had a PCN reaction causing immediate rash, facial/tongue/throat swelling, SOB or lightheadedness with hypotension: Yes Has patient had a PCN reaction causing severe rash involving mucus membranes or skin necrosis: No Has patient had a PCN reaction that required hospitalization No Has patient had a PCN reaction occurring within the last 10 years: No If all of the above answers are "NO", then may proceed with Cephalosporin use. Has patient had a PCN reaction causing immediate rash, facial/tongue/throat swelling, SOB or lightheadedness with hypotension: Yes Has patient had a PCN reaction causing severe rash involving mucus membranes or skin necrosis: No Has patient had a PCN reaction that required  hospitalization No Has patient had a PCN reaction occurring within the last 10 years: No If all of the above answers are "NO", then may proceed with Cephalosporin use.  . Erythromycin Base Itching and Rash  . Erythromycin Base Itching and Rash    VITALS:  Blood pressure (!) 128/93, pulse (!) 101, temperature 97.8 F (36.6 C), temperature source Oral, resp. rate 16, height 5\' 4"  (1.626 m), weight 74.5 kg (164 lb 4.8 oz), SpO2 98 %.  PHYSICAL EXAMINATION:  GENERAL:  36 y.o.-year-old patient lying in the bed with no acute distress.  EYES: Pupils equal, round, reactive to light and accommodation. No scleral icterus. Extraocular muscles intact.  HEENT: Head atraumatic, normocephalic. Oropharynx and nasopharynx clear.  NECK:  Supple, no jugular venous distention. No thyroid enlargement, no tenderness.  LUNGS: Normal breath sounds bilaterally, no wheezing, rales,rhonchi or crepitation. No use of accessory muscles of respiration.  CARDIOVASCULAR: S1, S2 normal. No murmurs, rubs, or gallops.  ABDOMEN: Soft, nontender, nondistended. Bowel sounds present. No organomegaly or mass.  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  NEUROLOGIC: Cranial nerves II through XII are intact. Muscle strength 5/5 in all extremities. Sensation intact. Gait not checked.  PSYCHIATRIC: The patient is alert and oriented x 3.  SKIN: No obvious rash, lesion, or ulcer.   Physical Exam LABORATORY PANEL:   CBC  Recent Labs Lab 10/07/16 0503  WBC 11.0*  HGB 11.9*  HCT 35.6*  PLT 227   ------------------------------------------------------------------------------------------------------------------  Chemistries   Recent Labs Lab 10/06/16 1343 10/07/16 0503  NA 138 139  K 3.5 3.8  CL 102 105  CO2 26 27  GLUCOSE 108* 255*  BUN 9 9  CREATININE 0.75 0.58*  CALCIUM 9.6 8.3*  MG  --  1.4*  AST 21  --   ALT 16*  --   ALKPHOS 82  --    BILITOT 0.5  --    ------------------------------------------------------------------------------------------------------------------  Cardiac Enzymes  Recent Labs Lab 10/07/16 0503 10/07/16 1054  TROPONINI <0.03 <0.03   ------------------------------------------------------------------------------------------------------------------  RADIOLOGY:  Dg Chest Portable 1 View  Result Date: 10/06/2016 CLINICAL DATA:  Acute chest pain today. EXAM: PORTABLE CHEST 1 VIEW COMPARISON:  07/30/2016 and prior studies FINDINGS: Upper limits normal heart size noted. There is no evidence of focal airspace disease, pulmonary edema, suspicious pulmonary nodule/mass, pleural effusion, or pneumothorax. No acute bony abnormalities are identified. IMPRESSION: No active disease. Electronically Signed   By: Harmon PierJeffrey  Hu M.D.   On: 10/06/2016 14:12   Ct Angio Chest Aorta W And/or Wo Contrast  Result Date: 10/06/2016 CLINICAL DATA:  Lambert ModySharp, central chest pain since this morning. Shortness of breath. Nausea. EXAM: CT ANGIOGRAPHY CHEST WITH CONTRAST TECHNIQUE: Multidetector CT imaging of the chest was performed using the standard protocol during bolus administration of intravenous contrast. Multiplanar CT image reconstructions and MIPs were obtained to evaluate the vascular anatomy. CONTRAST:  100 cc Isovue 370 COMPARISON:  Portable chest obtained earlier today and chest CTA dated 07/30/2016. FINDINGS: Cardiovascular: Normally opacified and normal appearing thoracic aorta without aneurysm or dissection. Small pericardial effusion with a maximum thickness of 9 mm. Normal sized heart. Mediastinum/Nodes: No enlarged mediastinal, hilar, or axillary lymph nodes. Thyroid gland, trachea, and esophagus demonstrate no significant findings. Lungs/Pleura: Minimal dependent atelectasis at the lung bases. No lung nodules or pleural fluid. Upper Abdomen: Multiple small gallstones in the gallbladder measuring up to 2 mm in maximum  diameter each. No gallbladder wall thickening or pericholecystic fluid seen. Musculoskeletal: Mild scoliosis. Mildly elevated left hemidiaphragm. Mild lower cervical spine degenerative changes. Review of the MIP images confirms the above findings. IMPRESSION: 1. No aortic aneurysm or dissection. 2. Small pericardial effusion. 3. Cholelithiasis. Electronically Signed   By: Beckie SaltsSteven  Reid M.D.   On: 10/06/2016 15:01    ASSESSMENT AND PLAN:   Active Problems:   Unstable angina (HCC)  Zachary GeorgeCharles Frediani  is a 36 y.o. male with a known history of obstructive sleep apnea, coronary artery disease, chronic pain syndrome arthrogryposis, insulin requiring diabetes mellitus, essential hypertension and multiple other medical problems is presenting to the ED with a chief complaint of intractablechest pain. Patient started having left-sided chest pain in the inframammary area 12 noon which was radiating to the left shoulder and associated with shortness of breath. Sublingual nitroglycerin in the ED did not relieve the pain so patient was started on nitroglycerin drip. Initial troponin is negative.   # UNSTABLE ANGINA with history of coronary artery disease Monitor on telemetry Initial troponin is negative cycle cardiac biomarkers- remained stable. Get echocardiogram Consult to cardiology-[facility] Check fasting lipid panel- LDL 112 Aspirin, beta blocker and statin   Stopped nitro drip and added ibuprophen round the clock, wait on echo to r/o pericarditis.  #Small pericardial effusion on CTchest Will get echocardiogram Cardiac consult appreciated    Suggest to stop Nitro drip, wait on echo to r/o pericarditis.  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 Patient is agreeable to nicotine patch. on nicotine patch   DVT prophylaxis with Lovenox subcutaneous    All the records are reviewed and case discussed with Care Management/Social Workerr. Management plans discussed with the patient, family and they are in agreement.  CODE STATUS: Full.  TOTAL TIME TAKING CARE OF THIS PATIENT: 35 minutes.    POSSIBLE D/C IN 1-2 DAYS, DEPENDING ON CLINICAL CONDITION.   Altamese Dilling M.D on 10/07/2016   Between 7am to 6pm - Pager - [phone]  After 6pm go to [email or web address] - Social research officer, government  [facility]  Office  [phone]  CC: Primary care physician; Emogene Morgan, MD  Note: This dictation was prepared with Dragon dictation along with smaller phrase technology. Any transcriptional errors that result from this process are unintentional.

## 2016-10-07 NOTE — Progress Notes (Signed)
Patient c/o 10 out of 10 pain at left breast, patient screaming and holding chest. VSS,Patient given 1 sublingual nitro, cardiology notified but suggested that pain is not cardiac related and suggested to contact attending physician for orders. Attending MD notified, orders to give patient PRN xanaflex, will give and continue to assess and monitor.

## 2016-10-07 NOTE — Consult Note (Signed)
Cardiology Consultation:   Patient ID: Cory Campbell; [id number]; 07-17-1980   Admit date: 10/06/2016 Date of Consult: 10/07/2016  Primary Care Provider: Donnie Coffin, MD Primary Cardiologist: Rockey Situ Primary Electrophysiologist:  None   Patient Profile:   Cory Campbell is a 36 y.o. male with a hx of arthrogryposis chronic pain syndrome who is being seen today for the evaluation of chest pain at the request of Dr Anselm Jungling.  History of Present Illness:   Cory Campbell 36 y.o. admitted with atypical chest pain. Has had history of such. Normal myovue in 2013. Pain is not exertional. Sharp in epigastric and lower left pectoral area He walks with crutches and braces. Pain can last 5 minutes and resolve spontaneously. No associated palpitations dyspnea syncope. Pain not pleuritic Is positional in shoulder  Pain has only been going on for 2 days No recent trauma or fall. Pain free this am ECG with no acute changes and troponin negative not clear why he was placed on iv nitro.    Past Medical History:  Diagnosis Date  . Abrasion or friction burn of foot and toe(s), without mention of infection   . Acid reflux   . Adopted   . Allergic rhinitis, cause unspecified   . Anxiety   . Arthritis   . Arthrogryposis   . Asthma   . Bladder wall thickening   . BPH (benign prostatic hyperplasia)   . Chest pain, unspecified   . Chronic pain syndrome   . Depressive disorder, not elsewhere classified   . Diabetes mellitus without complication ([place])   . Diverticulitis of colon (without mention of hemorrhage)(562.11)   . HTN (hypertension)   . Hyperlipidemia   . Hypogonadism in male   . Myalgia and myositis, unspecified   . OAB (overactive bladder)   . Obesity   . Other and unspecified noninfectious gastroenteritis and colitis(558.9)   . Other convulsions   . Other psoriasis   . 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 Premature baby    born premature  . Sleep apnea   .  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 or other cause, without mention of thyrotoxic crisis or storm    hyperthyroidism  . TIA (transient ischemic attack)   . Type II or unspecified type diabetes mellitus with neurological manifestations, not stated as uncontrolled(250.60)     Past Surgical History:  Procedure Laterality Date  . APPENDECTOMY    . CORNEAL TRANSPLANT    . feet surgery     both  . HAND SURGERY     left and right  . leg surgery     left and right  . ORTHOPEDIC SURGERY     hands, feet, knees, legs  . tubes in ears     both     Home Medications:  Prior to Admission medications   Medication Sig Start Date End Date Taking? Authorizing Provider  acyclovir (ZOVIRAX) 400 MG tablet Take 400 mg by mouth 2 (two) times daily.   Yes [provider]  albuterol (PROVENTIL HFA;VENTOLIN HFA) 108 (90 BASE) MCG/ACT inhaler Inhale 2 puffs into the lungs every 4 (four) hours as needed for wheezing or shortness of breath.    Yes [provider]  albuterol (PROVENTIL) (2.5 MG/3ML) 0.083% nebulizer solution Take 2.5 mg by nebulization every 4 (four) hours as needed for wheezing or shortness of breath.   Yes [provider]  alfuzosin (UROXATRAL) 10 MG 24 hr tablet Take 10 mg by mouth daily.   Yes  [provider]  amLODipine (NORVASC) 10 MG tablet Take 10 mg by mouth daily.   Yes [provider]  aspirin 81 MG tablet Take 81 mg by mouth daily.   Yes [provider]  baclofen (LIORESAL) 10 MG tablet Take 10 mg by mouth 3 (three) times daily.   Yes [provider]  beclomethasone (QVAR) 80 MCG/ACT inhaler Inhale 2 puffs into the lungs 2 (two) times daily.    Yes [provider]  Buprenorphine (BUTRANS) 15 MCG/HR PTWK Place 15 mg onto the skin once a week. Saturday   Yes [provider]  chlorthalidone (HYGROTON) 25 MG tablet Take 1 tablet (25 mg total) by mouth daily as needed. 08/25/16  Yes Gollan, Kathlene November, MD    citalopram (CELEXA) 20 MG tablet Take 20 mg by mouth daily.   Yes [provider]  cloNIDine (CATAPRES - DOSED IN MG/24 HR) 0.1 mg/24hr patch Place 0.1 mg onto the skin once a week. saturday   Yes [provider]  diclofenac sodium (VOLTAREN) 1 % GEL Apply 2-4 g topically 4 (four) times daily as needed. For pain.   Yes [provider]  dicyclomine (BENTYL) 20 MG tablet Take 20 mg by mouth every 6 (six) hours.   Yes [provider]  DOCOSAHEXAENOIC ACID PO Take 1 tablet by mouth daily.    Yes [provider]  famotidine (PEPCID) 20 MG tablet Take 20 mg by mouth 2 (two) times daily.  01/03/16  Yes [provider]  finasteride (PROSCAR) 5 MG tablet Take 5 mg by mouth daily.   Yes [provider]  fluticasone (FLONASE) 50 MCG/ACT nasal spray Place 2 sprays into the nose daily.   Yes [provider]  hydrALAZINE (APRESOLINE) 100 MG tablet take 1 tablet by mouth three times a day Patient taking differently: take 0.5 tablet by mouth three times a day 02/08/16  Yes Gollan, Kathlene November, MD  hydrocortisone cream 0.5 % Apply 1 application topically 2 (two) times daily.   Yes [provider]  HYDROmorphone (DILAUDID) 2 MG tablet Take 2 mg by mouth every 12 (twelve) hours. 08/28/16  Yes [provider]  HYDROmorphone (DILAUDID) 4 MG tablet Take 1 tablet (4 mg total) by mouth every 3 (three) hours as needed. Patient taking differently: Take 4 mg by mouth 2 (two) times daily as needed.  02/26/16  Yes Merlyn Lot, MD  ibuprofen (ADVIL,MOTRIN) 400 MG tablet Take 400 mg by mouth every 6 (six) hours as needed.   Yes [provider]  Insulin Degludec (TRESIBA FLEXTOUCH) 200 UNIT/ML SOPN Inject 100 Units into the skin daily at 10 pm.   Yes [provider]  isosorbide mononitrate (IMDUR) 30 MG 24 hr tablet take 1 tablet by mouth twice a day 08/30/16  Yes Gollan, Kathlene November, MD  ketoconazole (NIZORAL) 2 % shampoo  Apply 1 application topically 2 (two) times a week. tues and thursday 08/27/14  Yes [provider]  lisinopril (PRINIVIL,ZESTRIL) 40 MG tablet Take 40 mg by mouth daily.   Yes [provider]  loratadine (CLARITIN) 10 MG tablet Take 10 mg by mouth daily.   Yes [provider]  metoprolol (LOPRESSOR) 100 MG tablet Take 1.5 tablets (150 mg total) by mouth 2 (two) times daily. Patient taking differently: Take 100 mg by mouth 2 (two) times daily.  08/12/13  Yes Minna Merritts, MD  mirtazapine (REMERON) 45 MG tablet Take 45 mg by mouth at bedtime.   Yes [provider]  Multiple Vitamin (MULTIVITAMIN) tablet Take 1 tablet by mouth daily.   Yes [provider]  naloxone [facility]) nasal spray 4 mg/0.1 mL One spray in either nostril once for known/suspected opioid overdose. May repeat every 2-3 minutes in alternating nostril til EMS arrives 01/12/16  Yes [provider]  nitroGLYCERIN (NITROSTAT) 0.4 MG SL tablet Place 1 tablet (0.4 mg total) under the tongue every 5 (five) minutes as needed for chest pain. Call 911 if chest is not resolved w/ 3 tabs. 11/02/14  Yes Minna Merritts, MD  nortriptyline (PAMELOR) 25 MG capsule Take 50 mg by mouth at bedtime.   Yes [provider]  NOVOLOG FLEXPEN 100 UNIT/ML FlexPen Inject 10 Units into the skin See admin instructions. With every meal and every snack 12/13/15  Yes [provider]  nystatin-triamcinolone (MYCOLOG II) cream Apply 1 application topically 2 (two) times daily.   Yes [provider]  omeprazole (PRILOSEC) 20 MG capsule Take 20 mg by mouth daily.   Yes [provider]  ondansetron (ZOFRAN-ODT) 4 MG disintegrating tablet Take 4 mg by mouth every 8 (eight) hours as needed for nausea or vomiting.   Yes [provider]  polyethylene glycol (MIRALAX / GLYCOLAX) packet Take 17 g by mouth 3 (three) times daily.    Yes [provider]  potassium chloride  SA (K-DUR,KLOR-CON) 20 MEQ tablet take 1 tablet by mouth once daily if needed Patient taking differently: take 1 tablet by mouth once daily 02/08/16  Yes Gollan, Kathlene November, MD  pravastatin (PRAVACHOL) 80 MG tablet take 1 tablet by mouth at bedtime 04/17/16  Yes Gollan, Kathlene November, MD  prednisoLONE acetate (PRED FORTE) 1 % ophthalmic suspension Place 1 drop into the right eye daily.    Yes [provider]  RA MELATONIN 1 MG SUBL Take 1 mg by mouth at bedtime.  12/27/15  Yes [provider]  solifenacin (VESICARE) 10 MG tablet Take 10 mg by mouth daily.   Yes [provider]  spironolactone (ALDACTONE) 25 MG tablet Take 25 mg by mouth daily.   Yes [provider]  tamsulosin (FLOMAX) 0.4 MG CAPS Take 0.4 mg by mouth daily.   Yes [provider]  tiZANidine (ZANAFLEX) 4 MG capsule Take 4 mg by mouth 3 (three) times daily as needed.  01/12/16 01/11/17 Yes [provider]  zolpidem (AMBIEN) 10 MG tablet Take 10 mg by mouth at bedtime.   Yes [provider]  nystatin (MYCOSTATIN) powder Apply topically 3 (three) times daily. For 10 days Patient not taking: Reported on 10/06/2016 05/19/15   Marylene Land, NP    Inpatient Medications: Scheduled Meds: . acyclovir  400 mg Oral BID  . alfuzosin  10 mg Oral Daily  . amLODipine  10 mg Oral Daily  . aspirin EC  81 mg Oral Daily  . baclofen  10 mg Oral TID  . budesonide  2 mL Inhalation BID  . Buprenorphine  15 mg Transdermal Weekly  . chlorthalidone  25 mg Oral Daily  . citalopram  20 mg Oral Daily  . cloNIDine  0.1 mg Transdermal Weekly  . darifenacin  7.5 mg Oral Daily  . dicyclomine  20 mg Oral Q6H  . enoxaparin (LOVENOX) injection  40 mg Subcutaneous Q24H  . famotidine  20 mg Oral BID  . finasteride  5 mg Oral Daily  . fluticasone  2 spray Each Nare Daily  . hydrALAZINE  100 mg Oral TID  . HYDROmorphone  2 mg Oral Q12H  . insulin aspart  0-5 Units Subcutaneous QHS  . insulin aspart   0-9 Units Subcutaneous TID WC  . insulin aspart  10 Units Subcutaneous TID WC  . isosorbide mononitrate  30 mg Oral BID  . [START ON 10/09/2016] ketoconazole  1 application Topical Once per day on Mon Thu  . lisinopril  40 mg Oral Daily  . loratadine  10 mg Oral Daily  . Melatonin  2.5 mg Oral QHS  . metoprolol tartrate  150 mg Oral BID  . mirtazapine  45 mg Oral QHS  . multivitamins with iron  1 tablet Oral Daily  . nicotine  14 mg Transdermal Daily  . nortriptyline  50 mg Oral QHS  . pantoprazole  40 mg Oral Daily  . pneumococcal 23 valent vaccine  0.5 mL Intramuscular Tomorrow-1000  . polyethylene glycol  17 g Oral TID  . potassium chloride SA  20 mEq Oral Daily  . pravastatin  80 mg Oral QHS  . prednisoLONE acetate  1 drop Right Eye Daily  . spironolactone  25 mg Oral QPM  . tamsulosin  0.4 mg Oral Daily  . zolpidem  10 mg Oral QHS   Continuous Infusions: . magnesium sulfate 1 - 4 g bolus IVPB    . nitroGLYCERIN 60 mcg/min (10/06/16 2313)   PRN Meds: acetaminophen, albuterol, albuterol, guaiFENesin-dextromethorphan, HYDROmorphone, ibuprofen, nitroGLYCERIN, ondansetron (ZOFRAN) IV, tiZANidine  Allergies:    Allergies  Allergen Reactions  . Penicillins Itching, Rash and Hives    Has patient had a PCN reaction causing immediate rash, facial/tongue/throat swelling, SOB or lightheadedness with hypotension: Yes Has patient had a PCN reaction causing severe rash involving mucus membranes or skin necrosis: No Has patient had a PCN reaction that required hospitalization No Has patient had a PCN reaction occurring within the last 10 years: No If all of the above answers are "NO", then may proceed with Cephalosporin use. Has patient had a PCN reaction causing immediate rash, facial/tongue/throat swelling, SOB or lightheadedness with hypotension: Yes Has patient had a PCN reaction causing severe rash involving mucus membranes or skin necrosis: No Has patient had a PCN reaction that  required hospitalization No Has patient had a PCN reaction occurring within the last 10 years: No If all of the above answers are "NO", then may proceed with Cephalosporin use.  . Erythromycin Base Itching and Rash  . Erythromycin Base Itching and Rash    Social History:   Social History   Social History  . Marital status: Married    Spouse name: N/A  . Number of children: N/A  . Years of education: N/A   Occupational History  . Not on file.   Social History Main Topics  . Smoking status: Current Every Day Smoker    Years: 0.00  . Smokeless tobacco: Never Used     Comment: quit smoking friday 08/18/2016  . Alcohol use No     Comment: used to be moderate  . Drug use: No  . Sexual activity: Not on file   Other Topics Concern  . Not on file   Social History Narrative   Uses crutches to ambulate    Family History:   Father with HTN  Family History  Problem Relation Age of Onset  . Family history unknown: Yes     ROS:  Please see the history of present illness.  ROS  All other ROS reviewed and negative.     Physical Exam/Data:   Vitals:  10/06/16 2004 10/06/16 2233 10/07/16 0427 10/07/16 0931  BP: 131/67 118/62 128/82 130/73  Pulse: 90 96 86 94  Resp: _0 Temp: 98.4 F (36.9 C)  97.8 F (36.6 C) 97.8 F (36.6 C)  TempSrc: Oral  Oral Oral  SpO2: 100%  99% 98%  Weight: 164 lb 4.8 oz (74.5 kg)     Height:        Intake/Output Summary (Last 24 hours) at 10/07/16 1016 Last data filed at 10/07/16 0830  Gross per 24 hour  Intake                0 ml  Output              250 ml  Net             -250 ml   Filed Weights   10/06/16 1338 10/06/16 2004  Weight: 169 lb (76.7 kg) 164 lb 4.8 oz (74.5 kg)   Body mass index is 28.2 kg/m.  General:  Black male with multiple bone abnormalities  HEENT: normal Lymph: no adenopathy Neck: no JVD Endocrine:  No thryomegaly Vascular: No carotid bruits; FA pulses 2+ bilaterally without bruits  Cardiac:   normal S1, S2; RRR; no murmur   Lungs:  clear to auscultation bilaterally, no wheezing, rhonchi or rales  Abd: soft, nontender, no hepatomegaly  Ext: no edema Musculoskeletal:  LE leg deformaties  Skin: warm and dry  Neuro:  CNs 2-12 intact, no focal abnormalities noted Psych:  Normal affect   EKG:  The EKG was personally reviewed and demonstrates:  NSR no acute changes Q lead 3 and low precordial voltage  Telemetry:  Telemetry was personally reviewed and demonstrates:  NSR  Relevant CV Studies: Myovue 2013 normal  Echo pending   Laboratory Data:  Chemistry Recent Labs Lab 10/06/16 1343 10/07/16 0503  NA 138 139  K 3.5 3.8  CL 102 105  CO2 26 27  GLUCOSE 108* 255*  BUN 9 9  CREATININE 0.75 0.58*  CALCIUM 9.6 8.3*  GFRNONAA >60 >60  GFRAA >60 >60  ANIONGAP 10 7     Recent Labs Lab 10/06/16 1343  PROT 7.0  ALBUMIN 3.7  AST 21  ALT 16*  ALKPHOS 82  BILITOT 0.5   Hematology Recent Labs Lab 10/06/16 1343 10/07/16 0503  WBC 11.0* 11.0*  RBC 4.78 4.08*  HGB 13.7 11.9*  HCT 41.4 35.6*  MCV 86.5 87.1  MCH 28.7 29.2  MCHC 33.2 33.5  RDW 15.2* 15.6*  PLT 237 227   Cardiac Enzymes Recent Labs Lab 10/06/16 1343 10/07/16 0503  TROPONINI <0.03 <0.03   No results for input(s): TROPIPOC in the last 168 hours.  BNPNo results for input(s): BNP, PROBNP in the last 168 hours.  DDimer No results for input(s): DDIMER in the last 168 hours.  Radiology/Studies:  Dg Chest Portable 1 View  Result Date: 10/06/2016 CLINICAL DATA:  Acute chest pain today. EXAM: PORTABLE CHEST 1 VIEW COMPARISON:  07/30/2016 and prior studies FINDINGS: Upper limits normal heart size noted. There is no evidence of focal airspace disease, pulmonary edema, suspicious pulmonary nodule/mass, pleural effusion, or pneumothorax. No acute bony abnormalities are identified. IMPRESSION: No active disease. Electronically Signed   By: Margarette Canada M.D.   On: 10/06/2016 14:12   Ct Angio Chest Aorta W  And/or Wo Contrast  Result Date: 10/06/2016 CLINICAL DATA:  Hervey Ard, central chest pain since this morning. Shortness of breath. Nausea. EXAM: CT ANGIOGRAPHY CHEST WITH  CONTRAST TECHNIQUE: Multidetector CT imaging of the chest was performed using the standard protocol during bolus administration of intravenous contrast. Multiplanar CT image reconstructions and MIPs were obtained to evaluate the vascular anatomy. CONTRAST:  100 cc Isovue 370 COMPARISON:  Portable chest obtained earlier today and chest CTA dated 07/30/2016. FINDINGS: Cardiovascular: Normally opacified and normal appearing thoracic aorta without aneurysm or dissection. Small pericardial effusion with a maximum thickness of 9 mm. Normal sized heart. Mediastinum/Nodes: No enlarged mediastinal, hilar, or axillary lymph nodes. Thyroid gland, trachea, and esophagus demonstrate no significant findings. Lungs/Pleura: Minimal dependent atelectasis at the lung bases. No lung nodules or pleural fluid. Upper Abdomen: Multiple small gallstones in the gallbladder measuring up to 2 mm in maximum diameter each. No gallbladder wall thickening or pericholecystic fluid seen. Musculoskeletal: Mild scoliosis. Mildly elevated left hemidiaphragm. Mild lower cervical spine degenerative changes. Review of the MIP images confirms the above findings. IMPRESSION: 1. No aortic aneurysm or dissection. 2. Small pericardial effusion. 3. Cholelithiasis. Electronically Signed   By: Claudie Revering M.D.   On: 10/06/2016 15:01    Assessment and Plan:   1. Chest Pain: very atypical r/o no acute ECG changes normal myovue 2013 wean nitro to off CTA negative for aneurysm/dissection ? Small pericardial effusion F/U echo and check ESR r/o pericarditis. No rub on exam. Likely d/c in am with outpatient f/u Gollan and consider repeating myovue as outpatient  2. Cholesterol on statin 3. HTN:  Well controlled.  Continue current medications and low sodium Dash type diet.   4. DM:  Discussed  low carb diet.  Target hemoglobin A1c is 6.5 or less.  Continue current medications.    Signed, Jenkins Rouge, MD  10/07/2016 10:16 AM

## 2016-10-08 ENCOUNTER — Observation Stay (HOSPITAL_BASED_OUTPATIENT_CLINIC_OR_DEPARTMENT_OTHER)
Admit: 2016-10-08 | Discharge: 2016-10-08 | Disposition: A | Discharge status: Home / Self Care | Payer: Medicaid Other | Attending: Internal Medicine | Admitting: Internal Medicine

## 2016-10-08 DIAGNOSIS — Z880 Allergy status to penicillin: Secondary | ICD-10-CM | POA: Diagnosis not present

## 2016-10-08 DIAGNOSIS — Z79899 Other long term (current) drug therapy: Secondary | ICD-10-CM | POA: Diagnosis not present

## 2016-10-08 DIAGNOSIS — E785 Hyperlipidemia, unspecified: Secondary | ICD-10-CM | POA: Diagnosis present

## 2016-10-08 DIAGNOSIS — E1149 Type 2 diabetes mellitus with other diabetic neurological complication: Secondary | ICD-10-CM | POA: Diagnosis present

## 2016-10-08 DIAGNOSIS — K219 Gastro-esophageal reflux disease without esophagitis: Secondary | ICD-10-CM | POA: Diagnosis present

## 2016-10-08 DIAGNOSIS — R079 Chest pain, unspecified: Secondary | ICD-10-CM | POA: Diagnosis not present

## 2016-10-08 DIAGNOSIS — I313 Pericardial effusion (noninflammatory): Secondary | ICD-10-CM | POA: Diagnosis present

## 2016-10-08 DIAGNOSIS — J45909 Unspecified asthma, uncomplicated: Secondary | ICD-10-CM | POA: Diagnosis present

## 2016-10-08 DIAGNOSIS — R072 Precordial pain: Secondary | ICD-10-CM | POA: Diagnosis not present

## 2016-10-08 DIAGNOSIS — Z6828 Body mass index (BMI) 28.0-28.9, adult: Secondary | ICD-10-CM | POA: Diagnosis not present

## 2016-10-08 DIAGNOSIS — Z947 Corneal transplant status: Secondary | ICD-10-CM | POA: Diagnosis not present

## 2016-10-08 DIAGNOSIS — Z23 Encounter for immunization: Secondary | ICD-10-CM | POA: Diagnosis not present

## 2016-10-08 DIAGNOSIS — I25110 Atherosclerotic heart disease of native coronary artery with unstable angina pectoris: Secondary | ICD-10-CM | POA: Diagnosis present

## 2016-10-08 DIAGNOSIS — F17210 Nicotine dependence, cigarettes, uncomplicated: Secondary | ICD-10-CM | POA: Diagnosis present

## 2016-10-08 DIAGNOSIS — Q743 Arthrogryposis multiplex congenita: Secondary | ICD-10-CM | POA: Diagnosis not present

## 2016-10-08 DIAGNOSIS — I252 Old myocardial infarction: Secondary | ICD-10-CM | POA: Diagnosis not present

## 2016-10-08 DIAGNOSIS — G4733 Obstructive sleep apnea (adult) (pediatric): Secondary | ICD-10-CM | POA: Diagnosis present

## 2016-10-08 DIAGNOSIS — Z933 Colostomy status: Secondary | ICD-10-CM | POA: Diagnosis not present

## 2016-10-08 DIAGNOSIS — I959 Hypotension, unspecified: Secondary | ICD-10-CM | POA: Diagnosis not present

## 2016-10-08 DIAGNOSIS — E669 Obesity, unspecified: Secondary | ICD-10-CM | POA: Diagnosis present

## 2016-10-08 DIAGNOSIS — G894 Chronic pain syndrome: Secondary | ICD-10-CM | POA: Diagnosis present

## 2016-10-08 DIAGNOSIS — I10 Essential (primary) hypertension: Secondary | ICD-10-CM | POA: Diagnosis present

## 2016-10-08 DIAGNOSIS — Z7982 Long term (current) use of aspirin: Secondary | ICD-10-CM | POA: Diagnosis not present

## 2016-10-08 DIAGNOSIS — Z881 Allergy status to other antibiotic agents status: Secondary | ICD-10-CM | POA: Diagnosis not present

## 2016-10-08 DIAGNOSIS — Z8673 Personal history of transient ischemic attack (TIA), and cerebral infarction without residual deficits: Secondary | ICD-10-CM | POA: Diagnosis not present

## 2016-10-08 DIAGNOSIS — Z794 Long term (current) use of insulin: Secondary | ICD-10-CM | POA: Diagnosis not present

## 2016-10-08 LAB — CBC
HCT: 38.4 % — ABNORMAL LOW (ref 40.0–52.0)
HEMOGLOBIN: 12.6 g/dL — AB (ref 13.0–18.0)
MCH: 28.7 pg (ref 26.0–34.0)
MCHC: 32.9 g/dL (ref 32.0–36.0)
MCV: 87.2 fL (ref 80.0–100.0)
PLATELETS: 230 10*3/uL (ref 150–440)
RBC: 4.41 MIL/uL (ref 4.40–5.90)
RDW: 15.3 % — ABNORMAL HIGH (ref 11.5–14.5)
WBC: 11.1 10*3/uL — AB (ref 3.8–10.6)

## 2016-10-08 LAB — URINALYSIS, COMPLETE (UACMP) WITH MICROSCOPIC
BILIRUBIN URINE: NEGATIVE
Bacteria, UA: NONE SEEN
GLUCOSE, UA: NEGATIVE mg/dL
HGB URINE DIPSTICK: NEGATIVE
Ketones, ur: NEGATIVE mg/dL
NITRITE: NEGATIVE
PH: 5 (ref 5.0–8.0)
Protein, ur: NEGATIVE mg/dL
SPECIFIC GRAVITY, URINE: 1.012 (ref 1.005–1.030)

## 2016-10-08 LAB — C DIFFICILE QUICK SCREEN W PCR REFLEX
C Diff antigen: NEGATIVE
C Diff interpretation: NOT DETECTED
C Diff toxin: NEGATIVE

## 2016-10-08 LAB — ECHOCARDIOGRAM COMPLETE
Height: 64 in
Weight: 2620.8 oz

## 2016-10-08 LAB — COMPREHENSIVE METABOLIC PANEL
ALBUMIN: 2.1 g/dL — AB (ref 3.5–5.0)
ALT: 8 U/L — ABNORMAL LOW (ref 17–63)
ANION GAP: 5 (ref 5–15)
AST: 16 U/L (ref 15–41)
Alkaline Phosphatase: 52 U/L (ref 38–126)
BUN: 25 mg/dL — ABNORMAL HIGH (ref 6–20)
CHLORIDE: 110 mmol/L (ref 101–111)
CO2: 22 mmol/L (ref 22–32)
Calcium: 6.8 mg/dL — ABNORMAL LOW (ref 8.9–10.3)
Creatinine, Ser: 0.91 mg/dL (ref 0.61–1.24)
GFR calc non Af Amer: >60 mL/min (ref >60)
Glucose, Bld: 167 mg/dL — ABNORMAL HIGH (ref 65–99)
Potassium: 4.5 mmol/L (ref 3.5–5.1)
SODIUM: 137 mmol/L (ref 135–145)
Total Bilirubin: 0.5 mg/dL (ref 0.3–1.2)
Total Protein: 4.3 g/dL — ABNORMAL LOW (ref 6.5–8.1)

## 2016-10-08 LAB — GLUCOSE, CAPILLARY
GLUCOSE-CAPILLARY: 217 mg/dL — AB (ref 65–99)
GLUCOSE-CAPILLARY: 153 mg/dL — AB (ref 65–99)
Glucose-Capillary: 211 mg/dL — ABNORMAL HIGH (ref 65–99)
Glucose-Capillary: 176 mg/dL — ABNORMAL HIGH (ref 65–99)
Glucose-Capillary: 167 mg/dL — ABNORMAL HIGH (ref 65–99)
Glucose-Capillary: 149 mg/dL — ABNORMAL HIGH (ref 65–99)

## 2016-10-08 LAB — AMYLASE: AMYLASE: 35 U/L (ref 28–100)

## 2016-10-08 LAB — SEDIMENTATION RATE: Sed Rate: 2 mm/hr (ref 0–15)

## 2016-10-08 LAB — LIPASE, BLOOD: Lipase: 19 U/L (ref 11–51)

## 2016-10-08 LAB — TROPONIN I

## 2016-10-08 LAB — LACTIC ACID, PLASMA
LACTIC ACID, VENOUS: 2.1 mmol/L — AB (ref 0.5–1.9)
Lactic Acid, Venous: 2.1 mmol/L (ref 0.5–1.9)

## 2016-10-08 MED ORDER — SODIUM CHLORIDE 0.9 % IV BOLUS (SEPSIS)
500.0000 mL | Freq: Once | INTRAVENOUS | Status: AC
  Administered 2016-10-08: 500 mL via INTRAVENOUS

## 2016-10-08 MED ORDER — SODIUM CHLORIDE 0.9 % IV BOLUS (SEPSIS)
1000.0000 mL | Freq: Once | INTRAVENOUS | Status: AC
  Administered 2016-10-08: 1000 mL via INTRAVENOUS

## 2016-10-08 MED ORDER — ENSURE ENLIVE PO LIQD
237.0000 mL | Freq: Two times a day (BID) | ORAL | Status: DC
  Administered 2016-10-09 (×2): 237 mL via ORAL

## 2016-10-08 MED ORDER — SODIUM CHLORIDE 0.9 % IV BOLUS (SEPSIS)
1000.0000 mL | INTRAVENOUS | Status: AC
  Administered 2016-10-08: 1000 mL via INTRAVENOUS

## 2016-10-08 MED ORDER — SODIUM CHLORIDE 0.9 % IV BOLUS (SEPSIS): 1000.0000 mL | Freq: Once | INTRAVENOUS | Status: AC

## 2016-10-08 MED ORDER — PHENYLEPHRINE HCL 10 MG/ML IJ SOLN
0.0000 ug/min | INTRAMUSCULAR | Status: DC
  Administered 2016-10-08: 20 ug/min via INTRAVENOUS
  Administered 2016-10-09: 30 ug/min via INTRAVENOUS
  Filled 2016-10-08 (×2): qty 1

## 2016-10-08 MED ORDER — HYDROMORPHONE HCL 1 MG/ML IJ SOLN
1.0000 mg | Freq: Once | INTRAMUSCULAR | Status: DC
  Filled 2016-10-08: qty 1

## 2016-10-08 NOTE — Discharge Summary (Signed)
[facility] [place] NAME: Cory Campbell    MR#:  [id number]  DATE OF BIRTH:  [birth year]  DATE OF ADMISSION:  10/06/2016 ADMITTING PHYSICIAN: Nicholes Mango, MD  DATE OF DISCHARGE: 10/08/2016   PRIMARY CARE PHYSICIAN: Donnie Coffin, MD    ADMISSION DIAGNOSIS:  Nonspecific chest pain [R07.9]  DISCHARGE DIAGNOSIS:  Active Problems:   Unstable angina ([place])   SECONDARY DIAGNOSIS:   Past Medical History:  Diagnosis Date  . Abrasion or friction burn of foot and toe(s), without mention of infection   . Acid reflux   . Adopted   . Allergic rhinitis, cause unspecified   . Anxiety   . Arthritis   . Arthrogryposis   . Asthma   . Bladder wall thickening   . BPH (benign prostatic hyperplasia)   . Chest pain, unspecified   . Chronic pain syndrome   . Depressive disorder, not elsewhere classified   . Diabetes mellitus without complication ([place])   . Diverticulitis of colon (without mention of hemorrhage)(562.11)   . HTN (hypertension)   . Hyperlipidemia   . Hypogonadism in male   . Myalgia and myositis, unspecified   . OAB (overactive bladder)   . Obesity   . Other and unspecified noninfectious gastroenteritis and colitis(558.9)   . Other convulsions   . Other psoriasis   . 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 Premature baby    born premature  . Sleep apnea   . 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 or other cause, without mention of thyrotoxic crisis or storm    hyperthyroidism  . TIA (transient ischemic attack)   . Type II or unspecified type diabetes mellitus with neurological manifestations, not stated as uncontrolled(250.60)     HOSPITAL COURSE:   Cory Campbell a 36 y.o. malewith a known history of obstructive sleep apnea, coronary artery disease, chronic pain syndrome arthrogryposis, insulin requiring diabetes mellitus, essential hypertension and multiple other medical problems is presenting to  the ED with a chief complaint of intractablechest pain. Patient started having left-sided chest pain in the inframammary area 12 noon which was radiating to the left shoulder and associated with shortness of breath. Sublingual nitroglycerin in the ED did not relieve the pain so patient was started on nitroglycerin drip. Initial troponin is negative.   # UNSTABLE ANGINA with history of coronary artery disease Monitor on telemetry Initial troponin is negative cycle cardiac biomarkers- remained stable. Get echocardiogram- no acute findings. Consult to cardiology-[facility] Check fasting lipid panel- LDL 112 Aspirin, beta blocker and statin   Stopped nitro drip and added ibuprophen round the clock, no findigns as per echo.   ESR is 2, so no pericarditis,likely just muscular pain.  #Small pericardial effusion on CTchest Negative echocardiogram Cardiac consult appreciated    Suggest to stop Nitro drip, symptomatic management of pain.  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 Patient is agreeable to nicotine patch. on nicotine patch  # Diarrhea   Had C diff in past, receiving miralax till yesterday.    Checked, negative for C diff.  DVT prophylaxis with Lovenox subcutaneous  DISCHARGE CONDITIONS:  Stable.  CONSULTS OBTAINED:  Treatment Team:  Josue Hector, MD  DRUG ALLERGIES:   Allergies  Allergen Reactions  . Penicillins Itching, Rash and Hives    Has patient had a PCN reaction causing immediate rash, facial/tongue/throat  swelling, SOB or lightheadedness with hypotension: Yes Has patient had a PCN reaction causing severe rash involving mucus membranes or skin necrosis: No Has patient had a PCN reaction that required hospitalization No Has patient had a PCN reaction occurring within the last 10 years: No If all of the above answers are "NO", then may proceed with Cephalosporin use. Has patient had a PCN reaction causing immediate rash, facial/tongue/throat swelling, SOB or lightheadedness with hypotension: Yes Has patient had a PCN reaction causing severe rash involving mucus membranes or skin necrosis: No Has patient had a PCN reaction that required hospitalization No Has patient had a PCN reaction occurring within the last 10 years: No If all of the above answers are "NO", then may proceed with Cephalosporin use.  . Erythromycin Base Itching and Rash  . Erythromycin Base Itching and Rash    DISCHARGE MEDICATIONS:   Current Discharge Medication List    CONTINUE these medications which have NOT CHANGED   Details  acyclovir (ZOVIRAX) 400 MG tablet Take 400 mg by mouth 2 (two) times daily.    albuterol (PROVENTIL HFA;VENTOLIN HFA) 108 (90 BASE) MCG/ACT inhaler Inhale 2 puffs into the lungs every 4 (four) hours as needed for wheezing or shortness of breath.     albuterol (PROVENTIL) (2.5 MG/3ML) 0.083% nebulizer solution Take 2.5 mg by nebulization every 4 (four) hours as needed for wheezing or shortness of breath.    alfuzosin (UROXATRAL) 10 MG 24 hr tablet Take 10 mg by mouth daily.    amLODipine (NORVASC) 10 MG tablet Take 10 mg by mouth daily.    aspirin 81 MG tablet Take 81 mg by mouth daily.    baclofen (LIORESAL) 10 MG tablet Take 10 mg by mouth 3 (three) times daily.    beclomethasone (QVAR) 80 MCG/ACT inhaler Inhale 2 puffs into the lungs 2 (two) times daily.     Buprenorphine (BUTRANS) 15 MCG/HR PTWK Place 15 mg onto the skin once a week. Saturday    chlorthalidone (HYGROTON) 25 MG tablet  Take 1 tablet (25 mg total) by mouth daily as needed. Qty: 30 tablet, Refills: 3    citalopram (CELEXA) 20 MG tablet Take 20 mg by mouth daily.    cloNIDine (CATAPRES - DOSED IN MG/24 HR) 0.1 mg/24hr patch Place 0.1 mg onto the skin once a week. saturday    diclofenac sodium (VOLTAREN) 1 % GEL Apply 2-4 g topically 4 (four) times daily as needed. For pain.    dicyclomine (BENTYL) 20 MG tablet Take 20 mg by mouth every 6 (six) hours.    DOCOSAHEXAENOIC ACID PO Take 1 tablet by mouth daily.     famotidine (PEPCID) 20 MG tablet Take 20 mg by mouth 2 (two) times daily.     finasteride (PROSCAR) 5 MG tablet Take 5 mg by mouth daily.    fluticasone (FLONASE) 50 MCG/ACT nasal spray Place 2 sprays into the nose daily.    hydrALAZINE (APRESOLINE) 100 MG tablet take 1 tablet by mouth three times a day Qty: 90 tablet, Refills: 3    hydrocortisone cream 0.5 % Apply 1 application topically 2 (two) times daily.    !! HYDROmorphone (DILAUDID) 2 MG tablet Take 2 mg by mouth every 12 (twelve) hours. Refills: 0    !!  HYDROmorphone (DILAUDID) 4 MG tablet Take 1 tablet (4 mg total) by mouth every 3 (three) hours as needed. Qty: 12 tablet, Refills: 0    ibuprofen (ADVIL,MOTRIN) 400 MG tablet Take 400 mg by mouth every 6 (six) hours as needed.    Insulin Degludec (TRESIBA FLEXTOUCH) 200 UNIT/ML SOPN Inject 100 Units into the skin daily at 10 pm.    isosorbide mononitrate (IMDUR) 30 MG 24 hr tablet take 1 tablet by mouth twice a day Qty: 60 tablet, Refills: 3    ketoconazole (NIZORAL) 2 % shampoo Apply 1 application topically 2 (two) times a week. tues and thursday    lisinopril (PRINIVIL,ZESTRIL) 40 MG tablet Take 40 mg by mouth daily.    loratadine (CLARITIN) 10 MG tablet Take 10 mg by mouth daily.    metoprolol (LOPRESSOR) 100 MG tablet Take 1.5 tablets (150 mg total) by mouth 2 (two) times daily. Qty: 90 tablet, Refills: 6    mirtazapine (REMERON) 45 MG tablet Take 45 mg by mouth at  bedtime.    Multiple Vitamin (MULTIVITAMIN) tablet Take 1 tablet by mouth daily.    naloxone (NARCAN) nasal spray 4 mg/0.1 mL One spray in either nostril once for known/suspected opioid overdose. May repeat every 2-3 minutes in alternating nostril til EMS arrives    nitroGLYCERIN (NITROSTAT) 0.4 MG SL tablet Place 1 tablet (0.4 mg total) under the tongue every 5 (five) minutes as needed for chest pain. Call 911 if chest is not resolved w/ 3 tabs. Qty: 25 tablet, Refills: 1    nortriptyline (PAMELOR) 25 MG capsule Take 50 mg by mouth at bedtime.    NOVOLOG FLEXPEN 100 UNIT/ML FlexPen Inject 10 Units into the skin See admin instructions. With every meal and every snack Refills: 4    nystatin-triamcinolone (MYCOLOG II) cream Apply 1 application topically 2 (two) times daily.    omeprazole (PRILOSEC) 20 MG capsule Take 20 mg by mouth daily.    ondansetron (ZOFRAN-ODT) 4 MG disintegrating tablet Take 4 mg by mouth every 8 (eight) hours as needed for nausea or vomiting.    polyethylene glycol (MIRALAX / GLYCOLAX) packet Take 17 g by mouth 3 (three) times daily.     potassium chloride SA (K-DUR,KLOR-CON) 20 MEQ tablet take 1 tablet by mouth once daily if needed Qty: 90 tablet, Refills: 3    pravastatin (PRAVACHOL) 80 MG tablet take 1 tablet by mouth at bedtime Qty: 30 tablet, Refills: 3    prednisoLONE acetate (PRED FORTE) 1 % ophthalmic suspension Place 1 drop into the right eye daily.     RA MELATONIN 1 MG SUBL Take 1 mg by mouth at bedtime.  Refills: 0    solifenacin (VESICARE) 10 MG tablet Take 10 mg by mouth daily.    spironolactone (ALDACTONE) 25 MG tablet Take 25 mg by mouth daily.    tamsulosin (FLOMAX) 0.4 MG CAPS Take 0.4 mg by mouth daily.    tiZANidine (ZANAFLEX) 4 MG capsule Take 4 mg by mouth 3 (three) times daily as needed.     zolpidem (AMBIEN) 10 MG tablet Take 10 mg by mouth at bedtime.    nystatin (MYCOSTATIN) powder Apply topically 3 (three) times daily. For 10  days Qty: 15 g, Refills: 0     !! - Potential duplicate medications found. Please discuss with provider.       DISCHARGE INSTRUCTIONS:    Follow with PMD in 1-2 weeks.  If you experience worsening of your admission symptoms, develop shortness of breath, life threatening  emergency, suicidal or homicidal thoughts you must seek medical attention immediately by calling 911 or calling your MD immediately  if symptoms less severe.  You Must read complete instructions/literature along with all the possible adverse reactions/side effects for all the Medicines you take and that have been prescribed to you. Take any new Medicines after you have completely understood and accept all the possible adverse reactions/side effects.   Please note  You were cared for by a hospitalist during your hospital stay. If you have any questions about your discharge medications or the care you received while you were in the hospital after you are discharged, you can call the unit and asked to speak with the hospitalist on call if the hospitalist that took care of you is not available. Once you are discharged, your primary care physician will handle any further medical issues. Please note that NO REFILLS for any discharge medications will be authorized once you are discharged, as it is imperative that you return to your primary care physician (or establish a relationship with a primary care physician if you do not have one) for your aftercare needs so that they can reassess your need for medications and monitor your lab values.    Today   CHIEF COMPLAINT:   Chief Complaint  Patient presents with  . Chest Pain    HISTORY OF PRESENT ILLNESS:  Cory Campbell  is a 36 y.o. male with a known history of obstructive sleep apnea, coronary artery disease, chronic pain syndrome arthrogryposis, insulin requiring diabetes mellitus, essential hypertension and multiple other medical problems is presenting to the ED with a  chief complaint of intractablechest pain. Patient started having left-sided chest pain in the inframammary area 12 noon which was radiating to the left shoulder and associated with shortness of breath. Sublingual nitroglycerin in the ED did not relieve the pain so patient was started on nitroglycerin drip. Initial troponin is negative. EKG with no significant changes After nitroglycerin drip is started patient's pain was better   VITAL SIGNS:  Blood pressure 94/60, pulse (!) 102, temperature (!) 97.5 F (36.4 C), temperature source Oral, resp. rate 14, height 5' 4" (1.626 m), weight 74.3 kg (163 lb 12.8 oz), SpO2 96 %.  I/O:   Intake/Output Summary (Last 24 hours) at 10/08/16 1412 Last data filed at 10/08/16 1300  Gross per 24 hour  Intake              480 ml  Output             1000 ml  Net             -520 ml    PHYSICAL EXAMINATION:   GENERAL:  36 y.o.-year-old patient lying in the bed with no acute distress.  EYES: Pupils equal, round, reactive to light and accommodation. No scleral icterus. Extraocular muscles intact.  HEENT: Head atraumatic, normocephalic. Oropharynx and nasopharynx clear.  NECK:  Supple, no jugular venous distention. No thyroid enlargement, no tenderness.  LUNGS: Normal breath sounds bilaterally, no wheezing, rales,rhonchi or crepitation. No use of accessory muscles of respiration.  CARDIOVASCULAR: S1, S2 normal. No murmurs, rubs, or gallops.  ABDOMEN: Soft, nontender, nondistended. Bowel sounds present. No organomegaly or mass.  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  NEUROLOGIC: Cranial nerves II through XII are intact. Muscle strength 5/5 in all extremities. Sensation intact. Gait not checked.  PSYCHIATRIC: The patient is alert and oriented x 3.  SKIN: No  obvious rash, lesion, or ulcer.   DATA REVIEW:   CBC  Recent Labs Lab 10/07/16 0503  WBC 11.0*  HGB 11.9*  HCT 35.6*  PLT 227     Chemistries   Recent Labs Lab 10/06/16 1343 10/07/16 0503  NA 138 139  K 3.5 3.8  CL 102 105  CO2 26 27  GLUCOSE 108* 255*  BUN 9 9  CREATININE 0.75 0.58*  CALCIUM 9.6 8.3*  MG  --  1.4*  AST 21  --   ALT 16*  --   ALKPHOS 82  --   BILITOT 0.5  --     Cardiac Enzymes  Recent Labs Lab 10/07/16 1652  TROPONINI <0.03    Microbiology Results  Results for orders placed or performed during the hospital encounter of 10/06/16  C difficile quick scan w PCR reflex     Status: None   Collection Time: 10/08/16 11:40 AM  Result Value Ref Range Status   C Diff antigen NEGATIVE NEGATIVE Final   C Diff toxin NEGATIVE NEGATIVE Final   C Diff interpretation No C. difficile detected.  Final    RADIOLOGY:  Ct Angio Chest Aorta W And/or Wo Contrast  Result Date: 10/06/2016 CLINICAL DATA:  Cory Campbell, central chest pain since this morning. Shortness of breath. Nausea. EXAM: CT ANGIOGRAPHY CHEST WITH CONTRAST TECHNIQUE: Multidetector CT imaging of the chest was performed using the standard protocol during bolus administration of intravenous contrast. Multiplanar CT image reconstructions and MIPs were obtained to evaluate the vascular anatomy. CONTRAST:  100 cc Isovue 370 COMPARISON:  Portable chest obtained earlier today and chest CTA dated 07/30/2016. FINDINGS: Cardiovascular: Normally opacified and normal appearing thoracic aorta without aneurysm or dissection. Small pericardial effusion with a maximum thickness of 9 mm. Normal sized heart. Mediastinum/Nodes: No enlarged mediastinal, hilar, or axillary lymph nodes. Thyroid gland, trachea, and esophagus demonstrate no significant findings. Lungs/Pleura: Minimal dependent atelectasis at the lung bases. No lung nodules or pleural fluid. Upper Abdomen: Multiple small gallstones in the gallbladder measuring up to 2 mm in maximum diameter each. No gallbladder wall thickening or pericholecystic fluid seen. Musculoskeletal: Mild scoliosis. Mildly  elevated left hemidiaphragm. Mild lower cervical spine degenerative changes. Review of the MIP images confirms the above findings. IMPRESSION: 1. No aortic aneurysm or dissection. 2. Small pericardial effusion. 3. Cholelithiasis. Electronically Signed   By: Claudie Revering M.D.   On: 10/06/2016 15:01    EKG:   Orders placed or performed during the hospital encounter of 10/06/16  . ED EKG  . ED EKG  . EKG 12-Lead  . EKG 12-Lead  . EKG 12-lead  . EKG 12-Lead  . EKG 12-Lead      Management plans discussed with the patient, family and they are in agreement.  CODE STATUS:     Code Status Orders        Start     Ordered   10/06/16 1954  Full code  Continuous     10/06/16 1953    Code Status History    Date Active Date Inactive Code Status Order ID Comments User Context   This patient has a current code status but no historical code status.      TOTAL TIME TAKING CARE OF THIS PATIENT: 35 minutes.    Vaughan Basta M.D on 10/08/2016 at 2:12 PM  Between 7am to 6pm - Pager - [phone]  After 6pm go to [email or web address] - password [email or web address] [facility]  Office  [phone]  CC: Primary  care physician; Donnie Coffin, MD   Note: This dictation was prepared with Dragon dictation along with smaller phrase technology. Any transcriptional errors that result from this process are unintentional.

## 2016-10-08 NOTE — Progress Notes (Addendum)
Pt still complaining of pain, MD notified and received verbal orders. Upon reassessment of pt to give 1mg  IV Dilaudid per MD's orders, pt was lethargic and stated he felt a little dizzy. Held the dilaudid and checked his BP manually twice which was 58/42 pulse 88. Blood sugar was 176. Pt able to respond to questions appropriately. Paged MD, orders received. Started NS bolus of 500ml,  52400ml/hr. Will continue to monitor and recheck blood pressure.

## 2016-10-08 NOTE — Progress Notes (Signed)
[facility] [place] at [facility]   PATIENT NAME: Cory Campbell    MR#:  [id number]  DATE OF BIRTH:  12-Dec-1980  SUBJECTIVE:  CHIEF COMPLAINT:  Hypotension . Patient was about to be discharged but RN called me for low blood pressure. Patient suddenly became hypotensive and he did not receive any extra doses of narcotics today other than his home medications. Patient was reporting dizziness but still talking. Patient's wife just left home. She is aware that  patient's blood pressure is low.   REVIEW OF SYSTEMS:  CONSTITUTIONAL: No fever,Patient is feeling  weak EYES: No blurred or double vision.  EARS, NOSE, AND THROAT: No tinnitus or ear pain.  RESPIRATORY: No cough, shortness of breath, wheezing or hemoptysis.  CARDIOVASCULAR: No chest pain, orthopnea, edema. Reporting dizziness GASTROINTESTINAL: No nausea, vomiting, diarrhea or abdominal pain.  GENITOURINARY: No dysuria, hematuria.  ENDOCRINE: No polyuria, nocturia,  HEMATOLOGY: No anemia, easy bruising or bleeding SKIN: No rash or lesion. MUSCULOSKELETAL: No joint pain or arthritis.   NEUROLOGIC: No tingling, numbness, weakness.  PSYCHIATRY: No anxiety or depression.   DRUG ALLERGIES:   Allergies  Allergen Reactions  . Penicillins Itching, Rash and Hives    Has patient had a PCN reaction causing immediate rash, facial/tongue/throat swelling, SOB or lightheadedness with hypotension: Yes Has patient had a PCN reaction causing severe rash involving mucus membranes or skin necrosis: No Has patient had a PCN reaction that required hospitalization No Has patient had a PCN reaction occurring within the last 10 years: No If all of the above answers are "NO", then may proceed with Cephalosporin use. Has patient had a PCN reaction causing immediate rash, facial/tongue/throat swelling, SOB or lightheadedness with hypotension: Yes Has patient had a PCN reaction causing severe rash involving mucus membranes or  skin necrosis: No Has patient had a PCN reaction that required hospitalization No Has patient had a PCN reaction occurring within the last 10 years: No If all of the above answers are "NO", then may proceed with Cephalosporin use.  . Erythromycin Base Itching and Rash  . Erythromycin Base Itching and Rash    VITALS:  Blood pressure (!) 67/36, pulse 84, temperature 98.4 F (36.9 C), temperature source Oral, resp. rate 14, height 5\' 4"  (1.626 m), weight 74.3 kg (163 lb 12.8 oz), SpO2 99 %.  PHYSICAL EXAMINATION:  GENERAL:  36 y.o.-year-old patient lying in the bed with no acute distress.  EYES: Pupils equal, round, reactive to light and accommodation. No scleral icterus. Extraocular muscles intact.  HEENT: Head atraumatic, normocephalic. Oropharynx and nasopharynx clear.  NECK:  Supple, no jugular venous distention. No thyroid enlargement, no tenderness.  LUNGS: Normal breath sounds bilaterally, no wheezing, rales,rhonchi or crepitation. No use of accessory muscles of respiration.  CARDIOVASCULAR: S1, S2 normal. No murmurs, rubs, or gallops.  ABDOMEN: Soft, nontender, nondistended. Bowel sounds present. Chronic colostomy  EXTREMITIES: Chronic congenital abnormalities No pedal edema, cyanosis, or clubbing.  NEUROLOGIC: Cranial nerves II through XII are intact. Muscle strength at his baseline in all extremities.  PSYCHIATRIC: The patient is alert and oriented x 3.  SKIN: No obvious rash, lesion, or ulcer.    LABORATORY PANEL:   CBC  Recent Labs Lab 10/07/16 0503  WBC 11.0*  HGB 11.9*  HCT 35.6*  PLT 227   ------------------------------------------------------------------------------------------------------------------  Chemistries   Recent Labs Lab 10/06/16 1343 10/07/16 0503  NA 138 139  K 3.5 3.8  CL 102 105  CO2 26 27  GLUCOSE 108* 255*  BUN 9 9  CREATININE 0.75 0.58*  CALCIUM 9.6 8.3*  MG  --  1.4*  AST 21  --   ALT 16*  --   ALKPHOS 82  --   BILITOT 0.5  --     ------------------------------------------------------------------------------------------------------------------  Cardiac Enzymes  Recent Labs Lab 10/07/16 1652  TROPONINI <0.03   ------------------------------------------------------------------------------------------------------------------  RADIOLOGY:  No results found.  EKG:   Orders placed or performed during the hospital encounter of 10/06/16  . ED EKG  . ED EKG  . EKG 12-Lead  . EKG 12-Lead  . EKG 12-lead  . EKG 12-Lead  . EKG 12-Lead  . EKG 12-Lead  . EKG 12-Lead  . EKG 12-Lead  . EKG 12-Lead    ASSESSMENT AND PLAN:   Patient was about to be discharged but RN called me for low blood pressure. Patient suddenly became hypotensive and he did not receive any extra doses of narcotics today other than his home medications. Patient was reporting dizziness but still talking  # Severe hypotension with map - 45 Patient is symptomatic with dizziness IV fluid bolus 500 was given with no significant improvement, will give another 1 L bolus Stat blood cultures were ordered Check urine drug screen Stat EKG ordered-no acute ST-T wave changes Transfer patient to stepdown unit for pressors as blood pressure is trending down and patient is symptomatic, d/w e-link intensivist Dr. Jamison NeighborNestor Patient denies any chest pain but monitor him on telemetry Stool for C. 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 Management plans discussed with the patient, family and they are in agreement.  CODE STATUS: fc , wife is HCPOA  TOTAL  CRITICAL CARE TIME TAKING CARE OF THIS PATIENT: 37  minutes.    Note: This dictation was prepared with Dragon dictation along with smaller phrase technology. Any transcriptional errors that result from this process are unintentional.   Ramonita LabGouru, Chief Walkup M.D on 10/08/2016 at 5:19 PM  Between 7am to 6pm - Pager - [phone] After 6pm go to [email or web address] - password [email or web address]  [facility]  Office  [phone]  CC: Primary care physician; Emogene MorganAycock, Ngwe A, MD

## 2016-10-08 NOTE — Progress Notes (Signed)
Initial Nutrition Assessment  DOCUMENTATION CODES:   Not applicable  INTERVENTION:  Provide Ensure Enlive po BID, each supplement provides 350 kcal and 20 grams of protein. Encouraged patient to continue drinking Ensure at home.  Continue daily MVI.  NUTRITION DIAGNOSIS:   Inadequate oral intake related to poor appetite, nausea, other (see comment) (takes longer for patient to eat) as evidenced by per patient/family report.  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   Spoke with patient and wife at bedside. Patient reports his appetite has been decreased the past year. He believes it may be related to his ostomy bag. He reports he got his colostomy last year. Per chart review patient had partial colect[facility] with [facility]. Patient reports that with his ostomy it takes him longer to eat and he sometimes has nausea (improves with Zofran occasionally; denies any emesis). Patient reports his wife changes his ostomy and he does not usually look at it. Wife reports they are supposed to change every 3 days, but sometimes he has frequent output that requires daily changing if not more. Wife prepares 3 meals per day for patient. However, patient reports he really only eats 1 good meal per day, which is dinner. He will eat a meat and three vegetables at that dinner. He drinks 3 Ensure Original daily to replace calories and protein from the meals he cannot eat.  UBW was 221 lbs per patient report several years ago.  Patient was 198 lbs in early 2017. He was 187.5 lbs on 06/23/2015 (prior to placement of ostomy). He was 168 lbs on 07/30/2016. He lost 19.5 lbs (10.4% body weight) over approximately one year, which is not significant for time frame.   Meal Completion: 100% per chart  Medications reviewed and include: acyclovir, famotidine, Novolog 0-9 units TID, Novolog 0-5 units QHS, Novolog 10 units TID, Remeron 45 mg daily, MVI daily, pantoprazole, Miralax, potassium chloride 20 mEq daily.  Labs reviewed: CBG 164-217.  Nutrition-Focused physical exam completed. Findings are mild-moderate fat depletion in upper arm region, severe muscle depletion (noted only in patellar region, anterior thigh region, posterior calf region), and no edema. 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  Patient does not meet criteria for malnutrition at this time, but is at risk for malnutrition.  Discussed with RN.  Diet Order:  Diet heart healthy/carb modified Room service appropriate? Yes; Fluid consistency: Thin Diet - low sodium heart healthy  Skin:  Reviewed, no issues (colostomy)  Last BM:  10/08/2016 - 250 ml output into ostomy  Height:   Ht Readings from Last 1 Encounters:  10/06/16 5\' 4"  (1.626 m)    Weight:   Wt Readings from Last 1 Encounters:  10/08/16 163 lb 12.8 oz (74.3 kg)    Ideal Body Weight:  59.1 kg  BMI:  Body mass index is 28.12 kg/m.  Estimated Nutritional Needs:   Kcal:  1900-2200 (MSJ x 1.2-1.4)  Protein:  75-90 grams (1-1.2 grams/kg)  Fluid:  2.2-2.6 L/day (30-35 ml/kg)  EDUCATION NEEDS:   No education needs identified at this time  Helane Rima, MS, RD, LDN Pager: [phone] After Hours Pager: [phone]

## 2016-10-08 NOTE — Progress Notes (Signed)
eLink Physician-Brief Progress Note Patient Name: Cory ComeCharles J Campbell DOB: 1980-11-02 MRN: [id number]   Date of Service  10/08/2016  HPI/Events of Note  Camera check on patient after transfer to the ICU. Patient is awake. Oriented to place, year, and president. Endorsing "sharp" chest pain similar to the pain he had upon presenting. Also endorsing abdominal discomfort. Last episode of nausea approximately 3 days ago. Blood pressure seems to be responding to IV fluid bolus. Has not yet been started on peripheral vasopressor infusion.   eICU Interventions  1. Discontinued antihypertensive medications including clonidine patch 2. 1 L normal saline fluid bolus 3. Monitoring as per unit protocol  4. Stat troponin I, lactic acid, CMP, amylase, and lipase.      Intervention Category Major Interventions: Hypotension - evaluation and management  Lawanda CousinsJennings Ivylynn Hoppes 10/08/2016, 5:53 PM

## 2016-10-08 NOTE — Progress Notes (Signed)
Rechecked pt's blood pressure: BP 131/99 Pulse 86 O2 99 on RA. Then after that it went back down to 67/32 Pulse 83.  Pt sitting up and eating. No complaints of pain.

## 2016-10-08 NOTE — Progress Notes (Signed)
Progress Note  Patient Name: Cory Campbell Date of Encounter: 10/08/2016  Primary Cardiologist: Rockey Situ  Subjective   Continues to have atypical more right sided pain this am Going for echo   Inpatient Medications    Scheduled Meds: . acyclovir  400 mg Oral BID  . alfuzosin  10 mg Oral Daily  . amLODipine  10 mg Oral Daily  . aspirin EC  81 mg Oral Daily  . baclofen  10 mg Oral TID  . budesonide  2 mL Inhalation BID  . Buprenorphine  15 mg Transdermal Weekly  . chlorthalidone  25 mg Oral Daily  . citalopram  20 mg Oral Daily  . cloNIDine  0.1 mg Transdermal Weekly  . darifenacin  7.5 mg Oral Daily  . dicyclomine  20 mg Oral Q6H  . enoxaparin (LOVENOX) injection  40 mg Subcutaneous Q24H  . famotidine  20 mg Oral BID  . finasteride  5 mg Oral Daily  . fluticasone  2 spray Each Nare Daily  . hydrALAZINE  100 mg Oral TID  . HYDROmorphone  2 mg Oral Q12H  . ibuprofen  200 mg Oral TID  . insulin aspart  0-5 Units Subcutaneous QHS  . insulin aspart  0-9 Units Subcutaneous TID WC  . insulin aspart  10 Units Subcutaneous TID WC  . isosorbide mononitrate  30 mg Oral BID  . [START ON 10/09/2016] ketoconazole  1 application Topical Once per day on Mon Thu  . lisinopril  40 mg Oral Daily  . loratadine  10 mg Oral Daily  . Melatonin  2.5 mg Oral QHS  . metoprolol tartrate  150 mg Oral BID  . mirtazapine  45 mg Oral QHS  . multivitamins with iron  1 tablet Oral Daily  . nicotine  14 mg Transdermal Daily  . nortriptyline  50 mg Oral QHS  . pantoprazole  40 mg Oral Daily  . pneumococcal 23 valent vaccine  0.5 mL Intramuscular Tomorrow-1000  . polyethylene glycol  17 g Oral TID  . potassium chloride SA  20 mEq Oral Daily  . pravastatin  80 mg Oral QHS  . prednisoLONE acetate  1 drop Right Eye Daily  . spironolactone  25 mg Oral QPM  . tamsulosin  0.4 mg Oral Daily  . zolpidem  10 mg Oral QHS   Continuous Infusions:  PRN Meds: acetaminophen, albuterol, albuterol, alum & mag  hydroxide-simeth, guaiFENesin-dextromethorphan, HYDROmorphone, ibuprofen, nitroGLYCERIN, ondansetron (ZOFRAN) IV, tiZANidine   Vital Signs    Vitals:   10/07/16 1648 10/07/16 1941 10/08/16 0500 10/08/16 0806  BP: 108/72 122/76 128/77 119/81  Pulse: 95 90 92 92  Resp:  '19 20 17  '$ Temp:  99.4 F (37.4 C) 98.7 F (37.1 C) 98.4 F (36.9 C)  TempSrc:  Oral Oral Oral  SpO2: 100% 100% 99% 100%  Weight:   163 lb 12.8 oz (74.3 kg)   Height:        Intake/Output Summary (Last 24 hours) at 10/08/16 1033 Last data filed at 10/08/16 0900  Gross per 24 hour  Intake              240 ml  Output             1350 ml  Net            -1110 ml   Filed Weights   10/06/16 1338 10/06/16 2004 10/08/16 0500  Weight: 169 lb (76.7 kg) 164 lb 4.8 oz (74.5 kg) 163 lb 12.8 oz (  74.3 kg)    Telemetry    NSR - Personally Reviewed  ECG    NSR normal no acute changes of ischemia or pericarditis - Personally Reviewed  Physical Exam  Overweight black male  GEN: No acute distress.   Neck: No JVD Cardiac: RRR, no murmurs, rubs, or gallops.  Respiratory: Clear to auscultation bilaterally. GI: Soft, nontender, non-distended  MS: No edema; No deformity. Neuro:  Nonfocal  Psych: Normal affect   Labs    Chemistry Recent Labs Lab 10/06/16 1343 10/07/16 0503  NA 138 139  K 3.5 3.8  CL 102 105  CO2 26 27  GLUCOSE 108* 255*  BUN 9 9  CREATININE 0.75 0.58*  CALCIUM 9.6 8.3*  PROT 7.0  --   ALBUMIN 3.7  --   AST 21  --   ALT 16*  --   ALKPHOS 82  --   BILITOT 0.5  --   GFRNONAA >60 >60  GFRAA >60 >60  ANIONGAP 10 7     Hematology Recent Labs Lab 10/06/16 1343 10/07/16 0503  WBC 11.0* 11.0*  RBC 4.78 4.08*  HGB 13.7 11.9*  HCT 41.4 35.6*  MCV 86.5 87.1  MCH 28.7 29.2  MCHC 33.2 33.5  RDW 15.2* 15.6*  PLT 237 227    Cardiac Enzymes Recent Labs Lab 10/06/16 1343 10/07/16 0503 10/07/16 1054 10/07/16 1652  TROPONINI <0.03 <0.03 <0.03 <0.03   No results for input(s):  TROPIPOC in the last 168 hours.   BNPNo results for input(s): BNP, PROBNP in the last 168 hours.   DDimer No results for input(s): DDIMER in the last 168 hours.   Radiology    Dg Chest Portable 1 View  Result Date: 10/06/2016 CLINICAL DATA:  Acute chest pain today. EXAM: PORTABLE CHEST 1 VIEW COMPARISON:  07/30/2016 and prior studies FINDINGS: Upper limits normal heart size noted. There is no evidence of focal airspace disease, pulmonary edema, suspicious pulmonary nodule/mass, pleural effusion, or pneumothorax. No acute bony abnormalities are identified. IMPRESSION: No active disease. Electronically Signed   By: Margarette Canada M.D.   On: 10/06/2016 14:12   Ct Angio Chest Aorta W And/or Wo Contrast  Result Date: 10/06/2016 CLINICAL DATA:  Hervey Ard, central chest pain since this morning. Shortness of breath. Nausea. EXAM: CT ANGIOGRAPHY CHEST WITH CONTRAST TECHNIQUE: Multidetector CT imaging of the chest was performed using the standard protocol during bolus administration of intravenous contrast. Multiplanar CT image reconstructions and MIPs were obtained to evaluate the vascular anatomy. CONTRAST:  100 cc Isovue 370 COMPARISON:  Portable chest obtained earlier today and chest CTA dated 07/30/2016. FINDINGS: Cardiovascular: Normally opacified and normal appearing thoracic aorta without aneurysm or dissection. Small pericardial effusion with a maximum thickness of 9 mm. Normal sized heart. Mediastinum/Nodes: No enlarged mediastinal, hilar, or axillary lymph nodes. Thyroid gland, trachea, and esophagus demonstrate no significant findings. Lungs/Pleura: Minimal dependent atelectasis at the lung bases. No lung nodules or pleural fluid. Upper Abdomen: Multiple small gallstones in the gallbladder measuring up to 2 mm in maximum diameter each. No gallbladder wall thickening or pericholecystic fluid seen. Musculoskeletal: Mild scoliosis. Mildly elevated left hemidiaphragm. Mild lower cervical spine degenerative  changes. Review of the MIP images confirms the above findings. IMPRESSION: 1. No aortic aneurysm or dissection. 2. Small pericardial effusion. 3. Cholelithiasis. Electronically Signed   By: Claudie Revering M.D.   On: 10/06/2016 15:01    Cardiac Studies   Echo pending  Patient Profile    Cory Campbell is a 36 y.o.  male with a hx of arthrogryposis chronic pain syndrome who is being seen today for the evaluation of chest pain at the request of Dr Anselm Jungling.   Assessment & Plan    Chest Pain:  Very atypical hours with no acute ECG changes with pain and negative troponin no rub on exam no pericarditis on ECG ESR is normal For echo this am Ok to d/c home from cardiology perspective Consider outpatient lexiscan myovue Pain seems muscular in nature and may be related to his arthrogryposis   Cholesterol:  On statin  HTN:  Well controlled.  Continue current medications and low sodium Dash type diet.    DM:  Discussed low carb diet.  Target hemoglobin A1c is 6.5 or less.  Continue current medications.  Signed, Jenkins Rouge, MD  10/08/2016, 10:33 AM

## 2016-10-08 NOTE — Progress Notes (Signed)
Reassessed pt, able to answer questions and follow commands but will dose off and on. Paged MD, notified him of my assessments and blood pressure of 64/35 pulse 79. Verbal orders received to continue to monitor pt and keep NS going at 7050ml/h. Currently at pt's bedside.

## 2016-10-08 NOTE — Progress Notes (Signed)
Pt going down for an Echo.

## 2016-10-08 NOTE — Consult Note (Signed)
PULMONARY / CRITICAL CARE MEDICINE   Name: Cory Campbell MRN: [id number] DOB: 08/04/1980    ADMISSION DATE:  10/06/2016   CONSULTATION DATE:  10/08/16  REFERRING MD:  Amado Coe  Reason: Hypotension  CHIEF COMPLAINT:  hypertension  HISTORY OF PRESENT ILLNESS:  This is a 36 year old African-American male with a past medical history as indicated below was initially admitted with complaints of chest pain.is diagnosed with unstable angina and was being treated on the floor. Late this afternoon, patient became hypotensive. He was given fluid boluses without significant improvement in his blood pressure, hence was transferred to the ICU for further management. He is currently on low-dose of Neo-Synephrine and his blood pressures improving. Patient is complaining of foul-smelling diarrhea. He has a colostomy bag. He is a stool culture was negative for C. Difficile. He denies any associated fever, chills, or abdominal cramping. He reports having diarrhea prior to admission.  He is being followed by cardiology for chest pain.cardiac enzymes have been stable but he continues to have right-sided chest pain. His 2-D echo was unremarkable with a LV EF 50% -   55%.  PAST MEDICAL HISTORY :  He  has a past medical history of Abrasion or friction burn of foot and toe(s), without mention of infection; Acid reflux; Adopted; Allergic rhinitis, cause unspecified; Anxiety; Arthritis; Arthrogryposis; Asthma; Bladder wall thickening; BPH (benign prostatic hyperplasia); Chest pain, unspecified; Chronic pain syndrome; Depressive disorder, not elsewhere classified; Diabetes mellitus without complication (HCC); Diverticulitis of colon (without mention of hemorrhage)(562.11); HTN (hypertension); Hyperlipidemia; Hypogonadism in male; Myalgia and myositis, unspecified; OAB (overactive bladder); Obesity; Other and unspecified noninfectious gastroenteritis and colitis(558.9); Other convulsions; Other psoriasis; 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  PAST SURGICAL HISTORY: He  has a past surgical history that includes orthopedic surgery; Hand surgery; leg surgery; tubes in ears; feet surgery; Appendectomy; and Corneal transplant.  Allergies  Allergen Reactions  . Penicillins Itching, Rash and Hives    Has patient had a PCN reaction causing immediate rash, facial/tongue/throat swelling, SOB or lightheadedness with hypotension: Yes Has patient had a PCN reaction causing severe rash involving mucus membranes or skin necrosis: No Has patient had a PCN reaction that required hospitalization No Has patient had a PCN reaction occurring within the last 10 years: No If all of the above answers are "NO", then may proceed with Cephalosporin use. Has patient had a PCN reaction causing immediate rash, facial/tongue/throat swelling, SOB or lightheadedness with hypotension: Yes Has patient had a PCN reaction causing severe rash involving mucus membranes or skin necrosis: No Has patient had a PCN reaction that required hospitalization No Has patient had a PCN reaction occurring within the last 10 years: No If all of the above answers are "NO", then may proceed with Cephalosporin use.  . Erythromycin Base Itching and Rash  . Erythromycin Base Itching and Rash    No current facility-administered medications on file prior to encounter.    Current Outpatient Prescriptions on File Prior to Encounter  Medication Sig  . acyclovir (ZOVIRAX) 400 MG tablet Take 400 mg by mouth 2 (two) times daily.  Marland Kitchen albuterol (PROVENTIL HFA;VENTOLIN HFA) 108 (90 BASE) MCG/ACT inhaler Inhale 2 puffs into the lungs every 4 (four) hours as needed for wheezing or  shortness of breath.   Marland Kitchen albuterol (  PROVENTIL) (2.5 MG/3ML) 0.083% nebulizer solution Take 2.5 mg by nebulization every 4 (four) hours as needed for wheezing or shortness of breath.  . alfuzosin (UROXATRAL) 10 MG 24 hr tablet Take 10 mg by mouth daily.  Marland Kitchen. amLODipine (NORVASC) 10 MG tablet Take 10 mg by mouth daily.  Marland Kitchen. aspirin 81 MG tablet Take 81 mg by mouth daily.  . baclofen (LIORESAL) 10 MG tablet Take 10 mg by mouth 3 (three) times daily.  . beclomethasone (QVAR) 80 MCG/ACT inhaler Inhale 2 puffs into the lungs 2 (two) times daily.   . Buprenorphine (BUTRANS) 15 MCG/HR PTWK Place 15 mg onto the skin once a week. Saturday  . chlorthalidone (HYGROTON) 25 MG tablet Take 1 tablet (25 mg total) by mouth daily as needed.  . citalopram (CELEXA) 20 MG tablet Take 20 mg by mouth daily.  . cloNIDine (CATAPRES - DOSED IN MG/24 HR) 0.1 mg/24hr patch Place 0.1 mg onto the skin once a week. saturday  . diclofenac sodium (VOLTAREN) 1 % GEL Apply 2-4 g topically 4 (four) times daily as needed. For pain.  Marland Kitchen. dicyclomine (BENTYL) 20 MG tablet Take 20 mg by mouth every 6 (six) hours.  . DOCOSAHEXAENOIC ACID PO Take 1 tablet by mouth daily.   . famotidine (PEPCID) 20 MG tablet Take 20 mg by mouth 2 (two) times daily.   . finasteride (PROSCAR) 5 MG tablet Take 5 mg by mouth daily.  . fluticasone (FLONASE) 50 MCG/ACT nasal spray Place 2 sprays into the nose daily.  . hydrALAZINE (APRESOLINE) 100 MG tablet take 1 tablet by mouth three times a day (Patient taking differently: take 0.5 tablet by mouth three times a day)  . hydrocortisone cream 0.5 % Apply 1 application topically 2 (two) times daily.  Marland Kitchen. HYDROmorphone (DILAUDID) 4 MG tablet Take 1 tablet (4 mg total) by mouth every 3 (three) hours as needed. (Patient taking differently: Take 4 mg by mouth 2 (two) times daily as needed. )  . ibuprofen (ADVIL,MOTRIN) 400 MG tablet Take 400 mg by mouth every 6 (six) hours as needed.  . Insulin Degludec (TRESIBA FLEXTOUCH)  200 UNIT/ML SOPN Inject 100 Units into the skin daily at 10 pm.  . isosorbide mononitrate (IMDUR) 30 MG 24 hr tablet take 1 tablet by mouth twice a day  . ketoconazole (NIZORAL) 2 % shampoo Apply 1 application topically 2 (two) times a week. tues and thursday  . lisinopril (PRINIVIL,ZESTRIL) 40 MG tablet Take 40 mg by mouth daily.  Marland Kitchen. loratadine (CLARITIN) 10 MG tablet Take 10 mg by mouth daily.  . metoprolol (LOPRESSOR) 100 MG tablet Take 1.5 tablets (150 mg total) by mouth 2 (two) times daily. (Patient taking differently: Take 100 mg by mouth 2 (two) times daily. )  . mirtazapine (REMERON) 45 MG tablet Take 45 mg by mouth at bedtime.  . Multiple Vitamin (MULTIVITAMIN) tablet Take 1 tablet by mouth daily.  . naloxone (NARCAN) nasal spray 4 mg/0.1 mL One spray in either nostril once for known/suspected opioid overdose. May repeat every 2-3 minutes in alternating nostril til EMS arrives  . nitroGLYCERIN (NITROSTAT) 0.4 MG SL tablet Place 1 tablet (0.4 mg total) under the tongue every 5 (five) minutes as needed for chest pain. Call 911 if chest is not resolved w/ 3 tabs.  . nortriptyline (PAMELOR) 25 MG capsule Take 50 mg by mouth at bedtime.  Marland Kitchen. NOVOLOG FLEXPEN 100 UNIT/ML FlexPen Inject 10 Units into the skin See admin instructions. With every meal and every snack  .  nystatin-triamcinolone (MYCOLOG II) cream Apply 1 application topically 2 (two) times daily.  Marland Kitchen omeprazole (PRILOSEC) 20 MG capsule Take 20 mg by mouth daily.  . ondansetron (ZOFRAN-ODT) 4 MG disintegrating tablet Take 4 mg by mouth every 8 (eight) hours as needed for nausea or vomiting.  . polyethylene glycol (MIRALAX / GLYCOLAX) packet Take 17 g by mouth 3 (three) times daily.   . potassium chloride SA (K-DUR,KLOR-CON) 20 MEQ tablet take 1 tablet by mouth once daily if needed (Patient taking differently: take 1 tablet by mouth once daily)  . pravastatin (PRAVACHOL) 80 MG tablet take 1 tablet by mouth at bedtime  . prednisoLONE acetate  (PRED FORTE) 1 % ophthalmic suspension Place 1 drop into the right eye daily.   Marland Kitchen RA MELATONIN 1 MG SUBL Take 1 mg by mouth at bedtime.   . solifenacin (VESICARE) 10 MG tablet Take 10 mg by mouth daily.  Marland Kitchen spironolactone (ALDACTONE) 25 MG tablet Take 25 mg by mouth daily.  . tamsulosin (FLOMAX) 0.4 MG CAPS Take 0.4 mg by mouth daily.  Marland Kitchen tiZANidine (ZANAFLEX) 4 MG capsule Take 4 mg by mouth 3 (three) times daily as needed.   . zolpidem (AMBIEN) 10 MG tablet Take 10 mg by mouth at bedtime.  Marland Kitchen nystatin (MYCOSTATIN) powder Apply topically 3 (three) times daily. For 10 days (Patient not taking: Reported on 10/06/2016)    FAMILY HISTORY:  His indicated that his mother is alive. He indicated that his father is alive.    SOCIAL HISTORY: He  reports that he has been smoking.  He has smoked for the past 0.00 years. He has never used smokeless tobacco. He reports that he does not drink alcohol or use drugs.  REVIEW OF SYSTEMS:   Constitutional: Negative for fever and chills.  HENT: Negative for congestion and rhinorrhea.  Eyes: Negative for redness and visual disturbance.  Respiratory: Negative for shortness of breath and wheezing.  Cardiovascular: positive for right sided chest pain but negative for  palpitations.  Gastrointestinal: Negative  for nausea , vomiting and abdominal pain but positive for   Loose stools Genitourinary: Negative for dysuria and urgency.  Endocrine: Denies polyuria, polyphagia and heat intolerance Musculoskeletal: Negative for myalgias and arthralgias.  Skin: Negative for pallor and wound.  Neurological: Negative for dizziness and headaches positive for chronic generalized pain  SUBJECTIVE:   VITAL SIGNS: BP (!) 67/36 (BP Location: Right Arm)   Pulse 84   Temp 98.4 F (36.9 C) (Oral)   Resp 20   Ht 5\' 4"  (1.626 m)   Wt 163 lb 12.8 oz (74.3 kg)   SpO2 97%   BMI 28.12 kg/m   HEMODYNAMICS:    VENTILATOR SETTINGS:    INTAKE / OUTPUT: I/O last 3 completed  shifts: In: 480 [P.O.:480] Out: 1850 [Urine:400; Stool:1450]  PHYSICAL EXAMINATION: General: older for age, no acute distress Neuro:  Alert and oriented 3, no focal deficits HEENT:  PERRLA, trachea midline, oral mucosa moist Cardiovascular:  Normal sinus rhythm, S1, S2, no murmur, regurg or gallop, +2 pulses, no edema Lungs:  Clear to auscultation bilaterally Abdomen:  Distended, left lower abdominal colostomy back, positive bowel sounds, foul-smelling stool Musculoskeletal:  No deformities, positive range of motion Skin:  Warm and dry  LABS:  BMET  Recent Labs Lab 10/06/16 1343 10/07/16 0503 10/08/16 1818  NA 138 139 137  K 3.5 3.8 4.5  CL 102 105 110  CO2 26 27 22   BUN 9 9 25*  CREATININE 0.75 0.58* 0.91  GLUCOSE 108* 255* 167*    Electrolytes  Recent Labs Lab 10/06/16 1343 10/07/16 0503 10/08/16 1818  CALCIUM 9.6 8.3* 6.8*  MG  --  1.4*  --     CBC  Recent Labs Lab 10/06/16 1343 10/07/16 0503 10/08/16 1818  WBC 11.0* 11.0* 11.1*  HGB 13.7 11.9* 12.6*  HCT 41.4 35.6* 38.4*  PLT 237 227 230    Coag's No results for input(s): APTT, INR in the last 168 hours.  Sepsis Markers  Recent Labs Lab 10/08/16 1818  LATICACIDVEN 2.1*    ABG No results for input(s): PHART, PCO2ART, PO2ART in the last 168 hours.  Liver Enzymes  Recent Labs Lab 10/06/16 1343 10/08/16 1818  AST 21 16  ALT 16* 8*  ALKPHOS 82 52  BILITOT 0.5 0.5  ALBUMIN 3.7 2.1*    Cardiac Enzymes  Recent Labs Lab 10/07/16 1054 10/07/16 1652 10/08/16 1818  TROPONINI <0.03 <0.03 <0.03    Glucose  Recent Labs Lab 10/07/16 2052 10/08/16 0803 10/08/16 1156 10/08/16 1505 10/08/16 1706 10/08/16 1744  GLUCAP 164* 211* 217* 176* 167* 153*    Imaging No results found.   STUDIES:  none  CULTURES: Stool for C. Difficile negative  ANTIBIOTICS: Flagyl  SIGNIFICANT EVENTS: 08/31: admitted  LINES/TUBES: Peripheral IVs Colostomy  bag  DISCUSSION: 36 year old African-American male with multiple comorbidities conditions presented with hypotension, refractory to fluid resuscitation, chest pain and diarrhea. He has a history of noninfectious gastroenteritis and colitis. He has no leukocytosis and his pro-calcitonin is normal.   ASSESSMENT  Hypotension, exact cause unknown but most likely secondary to volume depletion from diarrhea. Mild lactic acidosis-lactic acid level 2.1 Chest pain-cardiac enzymes negative so far Hypomagnesemia. Hypophosphatemia. Diarrhea of unknown etiology - stool negative for C. Difficile  PLAN IV fluids Neo-Synephrine infusion, titrate to maintain mean arterial blood pressure greater than 65 Cardiology following, appreciate input. Monitor and replace electrolytes Flagyl 500 mg 3 times a day5 days. Repeat stool culture if persistent foul smelling diarrhea Trend Pro-calcitonin Cycle cardiac enzymes per cardiology GI and DVT prophylaxis Further changes in treatment plan pending clinical course and diagnostics  FAMILY  - Updates: family at bedside. We'll updated when available  - Inter-disciplinary family meet or Palliative Care meeting due by:  day 7  Litzy Dicker S. [facility] ANP-BC Pulmonary and Critical Care Medicine [facility] Pager [phone] or [phone] 10/08/2016, 8:13 PM

## 2016-10-08 NOTE — Progress Notes (Signed)
eLink Physician-Brief Progress Note Patient Name: Cory Campbell DOB: Aug 20, 1980 MRN: [id number]   Date of Service  10/08/2016  HPI/Events of Note  Notified by hospitalist of New hypotension. Admitted with chest discomfort. Previously on nitroglycerin drip. Last hemoglobin yesterday morning was stable. Reportedly pupils are not pinpoint. Chronically on narcotics and antihypertensive medications. Reportedly patient is not complaining of chest pain at this time. Currently receiving bolus IV fluids through peripheral IV.   eICU Interventions  1. Transferring patient to ICU 2. Starting low-dose peripherally infused Neo-Synephrine 3. Stat EKG & CBC 4. Intensivist team to assess patient upon arrival to the ICU      Intervention Category Evaluation Type: New Patient Evaluation  Lawanda CousinsJennings Nestor 10/08/2016, 5:21 PM

## 2016-10-09 ENCOUNTER — Other Ambulatory Visit: Payer: Self-pay | Admitting: Cardiovascular Disease

## 2016-10-09 LAB — GLUCOSE, CAPILLARY
GLUCOSE-CAPILLARY: 139 mg/dL — AB (ref 65–99)
GLUCOSE-CAPILLARY: 160 mg/dL — AB (ref 65–99)
GLUCOSE-CAPILLARY: 211 mg/dL — AB (ref 65–99)
Glucose-Capillary: 195 mg/dL — ABNORMAL HIGH (ref 65–99)
Glucose-Capillary: 172 mg/dL — ABNORMAL HIGH (ref 65–99)

## 2016-10-09 LAB — LACTIC ACID, PLASMA: Lactic Acid, Venous: 1.3 mmol/L (ref 0.5–1.9)

## 2016-10-09 LAB — CBC
HEMATOCRIT: 36.7 % — AB (ref 40.0–52.0)
HEMOGLOBIN: 12.4 g/dL — AB (ref 13.0–18.0)
MCH: 28.9 pg (ref 26.0–34.0)
MCHC: 33.8 g/dL (ref 32.0–36.0)
MCV: 85.5 fL (ref 80.0–100.0)
Platelets: 229 10*3/uL (ref 150–440)
RBC: 4.29 MIL/uL — AB (ref 4.40–5.90)
RDW: 15.4 % — ABNORMAL HIGH (ref 11.5–14.5)
WBC: 10.3 10*3/uL (ref 3.8–10.6)

## 2016-10-09 LAB — URINE DRUG SCREEN, QUALITATIVE (ARMC ONLY)
Amphetamines, Ur Screen: NOT DETECTED
BARBITURATES, UR SCREEN: NOT DETECTED
Benzodiazepine, Ur Scrn: NOT DETECTED
CANNABINOID 50 NG, UR ~~LOC~~: NOT DETECTED
COCAINE METABOLITE, UR ~~LOC~~: NOT DETECTED
MDMA (ECSTASY) UR SCREEN: NOT DETECTED
Methadone Scn, Ur: NOT DETECTED
Opiate, Ur Screen: POSITIVE — AB
PHENCYCLIDINE (PCP) UR S: NOT DETECTED
TRICYCLIC, UR SCREEN: POSITIVE — AB

## 2016-10-09 LAB — BASIC METABOLIC PANEL
Anion gap: 4 — ABNORMAL LOW (ref 5–15)
BUN: 12 mg/dL (ref 6–20)
CHLORIDE: 112 mmol/L — AB (ref 101–111)
CO2: 23 mmol/L (ref 22–32)
Calcium: 6.7 mg/dL — ABNORMAL LOW (ref 8.9–10.3)
Creatinine, Ser: 0.67 mg/dL (ref 0.61–1.24)
GFR calc Af Amer: >60 mL/min (ref >60)
GFR calc non Af Amer: >60 mL/min (ref >60)
GLUCOSE: 129 mg/dL — AB (ref 65–99)
POTASSIUM: 3.4 mmol/L — AB (ref 3.5–5.1)
SODIUM: 139 mmol/L (ref 135–145)

## 2016-10-09 LAB — MAGNESIUM: MAGNESIUM: 1.6 mg/dL — AB (ref 1.7–2.4)

## 2016-10-09 LAB — PROCALCITONIN: Procalcitonin: <0.1 ng/mL

## 2016-10-09 LAB — PHOSPHORUS: Phosphorus: 2.4 mg/dL — ABNORMAL LOW (ref 2.5–4.6)

## 2016-10-09 LAB — MRSA PCR SCREENING: MRSA BY PCR: NEGATIVE

## 2016-10-09 LAB — TROPONIN I
Troponin I: <0.03 ng/mL (ref <0.03)
Troponin I: <0.03 ng/mL (ref <0.03)

## 2016-10-09 MED ORDER — METRONIDAZOLE 500 MG PO TABS
500.0000 mg | ORAL_TABLET | Freq: Three times a day (TID) | ORAL | Status: DC
  Administered 2016-10-09 – 2016-10-10 (×5): 500 mg via ORAL
  Filled 2016-10-09 (×6): qty 1

## 2016-10-09 MED ORDER — SODIUM CHLORIDE 0.9 % IV BOLUS (SEPSIS)
1000.0000 mL | Freq: Once | INTRAVENOUS | Status: AC
  Administered 2016-10-09: 1000 mL via INTRAVENOUS

## 2016-10-09 NOTE — Progress Notes (Signed)
Prior to transfer to the floor the pt began to c/o 9/10 CP that he described as sharp with radiation from the left side the mid chest. Nitro 0.4 SL was given x 1 with no relief. Pt described it as intermittent, and stated that he experiences this every once in a while at home. Dr Esaw GrandchildVachanni was made aware, no new orders this time. Dr Esaw GrandchildVachanni stated that he felt it wasn't cardiac related and that it was muscular related. Pt transferred to the floor on a telemetry monitor. Report given to Wilhemina CashAnnabelle, RN and Carney BernJean, Charity fundraiserN.

## 2016-10-09 NOTE — Progress Notes (Signed)
[facility] NAME: Cory Campbell    MR#:  [id number]  DATE OF BIRTH:  [birth year]  SUBJECTIVE:  CHIEF COMPLAINT:   Chief Complaint  Patient presents with  . Chest Pain     Came with chest pain, troponins stable, pain continues.   He said nitro drip helps a little.   Seen by cardiologist, and he suggested to stop nitro drip.   He had hypotension yesterday, so did not go home, but transferred to stepdown unit instead.   Given IV levophed and held all Htn meds, Now BP stable.  REVIEW OF SYSTEMS:   CONSTITUTIONAL: No fever, fatigue or weakness.  EYES: No blurred or double vision.  EARS, NOSE, AND THROAT: No tinnitus or ear pain.  RESPIRATORY: No cough, shortness of breath, wheezing or hemoptysis.  CARDIOVASCULAR:reporting left-sided chest pain and shortness of breath. No orthopnea, edema.  GASTROINTESTINAL: No nausea, vomiting, diarrhea or abdominal pain.  GENITOURINARY: No dysuria, hematuria.  ENDOCRINE: No polyuria, nocturia,  HEMATOLOGY: No anemia, easy bruising or bleeding SKIN: No rash or lesion. MUSCULOSKELETAL: No joint pain or arthritis.   NEUROLOGIC: No tingling, numbness, weakness.  PSYCHIATRY: No anxiety or depression.  ROS  DRUG ALLERGIES:   Allergies  Allergen Reactions  . Penicillins Itching, Rash and Hives    Has patient had a PCN reaction causing immediate rash, facial/tongue/throat swelling, SOB or lightheadedness with hypotension: Yes Has patient had a PCN reaction causing severe rash involving mucus membranes or skin necrosis: No Has patient had a PCN reaction that required hospitalization No Has patient had a PCN reaction occurring within the last 10 years: No If all of the above answers are "NO", then may proceed with Cephalosporin use. Has patient had a PCN reaction causing immediate rash, facial/tongue/throat swelling, SOB or lightheadedness with hypotension: Yes Has patient had a PCN reaction causing  severe rash involving mucus membranes or skin necrosis: No Has patient had a PCN reaction that required hospitalization No Has patient had a PCN reaction occurring within the last 10 years: No If all of the above answers are "NO", then may proceed with Cephalosporin use.  . Erythromycin Base Itching and Rash  . Erythromycin Base Itching and Rash    VITALS:  Blood pressure (!) 149/92, pulse 93, temperature 98 F (36.7 C), temperature source Oral, resp. rate (!) 22, height _0  (1.626 m), weight 74.3 kg (163 lb 12.8 oz), SpO2 100 %.  PHYSICAL EXAMINATION:  GENERAL:  36 y.o.-year-old patient lying in the bed with no acute distress.  EYES: Pupils equal, round, reactive to light and accommodation. No scleral icterus. Extraocular muscles intact.  HEENT: Head atraumatic, normocephalic. Oropharynx and nasopharynx clear.  NECK:  Supple, no jugular venous distention. No thyroid enlargement, no tenderness.  LUNGS: Normal breath sounds bilaterally, no wheezing, rales,rhonchi or crepitation. No use of accessory muscles of respiration.  CARDIOVASCULAR: S1, S2 normal. No murmurs, rubs, or gallops.  ABDOMEN: Soft, nontender, nondistended. Bowel sounds present. No organomegaly or mass. Colostomy in place. EXTREMITIES:  Congenital limbs abnormalities and atrophies absent and short fingers, No pedal edema, cyanosis, or clubbing.  NEUROLOGIC: Cranial nerves II through XII are intact. Muscle strength 5/5 in all extremities. Sensation intact. Gait not checked.  PSYCHIATRIC: The patient is alert and oriented x 3.  SKIN: No obvious rash, lesion, or ulcer.   Physical Exam LABORATORY PANEL:   CBC  Recent Labs Lab 10/09/16 0554  WBC 10.3  HGB 12.4*  HCT 36.7*  PLT 229   ------------------------------------------------------------------------------------------------------------------  Chemistries   Recent Labs Lab 10/08/16 1818 10/09/16 0554  NA 137 139  K 4.5 3.4*  CL 110 112*  CO2 22 23   GLUCOSE 167* 129*  BUN 25* 12  CREATININE 0.91 0.67  CALCIUM 6.8* 6.7*  MG  --  1.6*  AST 16  --   ALT 8*  --   ALKPHOS 52  --   BILITOT 0.5  --    ------------------------------------------------------------------------------------------------------------------  Cardiac Enzymes  Recent Labs Lab 10/08/16 2351 10/09/16 0554  TROPONINI <0.03 <0.03   ------------------------------------------------------------------------------------------------------------------  RADIOLOGY:  No results found.  ASSESSMENT AND PLAN:   Active Problems:   Unstable angina (HCC)   Hypotension  Cory Campbell  is a 36 y.o. male with a known history of obstructive sleep apnea, coronary artery disease, chronic pain syndrome arthrogryposis, insulin requiring diabetes mellitus, essential hypertension and multiple other medical problems is presenting to the ED with a chief complaint of intractablechest pain. Patient started having left-sided chest pain in the inframammary area 12 noon which was radiating to the left shoulder and associated with shortness of breath. Sublingual nitroglycerin in the ED did not relieve the pain so patient was started on nitroglycerin drip. Initial troponin is negative.   # UNSTABLE ANGINA with history of coronary artery disease Monitored on telemetry serial troponin are negative cycle cardiac biomarkers- remained stable. reviewed echocardiogram Consult to cardiology-[facility] appreciated, ESR is 2, - suggested PAIN IS NOT CARDIAC Check fasting lipid panel- LDL 112 Aspirin, beta blocker and statin   Stopped nitro drip and added ibuprophen round the clock.   LIKELY THE PAIN IS DUE TO HIS MUSCULAR DISORDER, he follows cronically in pain clinic in [facility].  #Small pericardial effusion on CT chest  negative echocardiogram Cardiac consult appreciated Negative echo to r/o pericarditis.  #Essential hypertension Held meds due to hypotension, was on levophed drip, resolved now,  will start BP meds as needed.  #Insulin requiring diabetes mellitus Stable now. Home meds.  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   He claims to be taking dilaudid, which was prescribed 2 weeks ago from there, but his urine on admission is negative for opioids, which turned positive after receiving here, on 3 rd day, so it is questionable , if he really takes it or not and WHAT HE DOES with his meds??  #Obstructive sleep apnea continue CPAP daily at bedtime  #Tobacco abuse disorder Counseled patient to stop smoking for 5 minutes. Patient is agreeable to nicotine patch.  # C/o diarrhea   Checked C diff- negative.   Still due to hypotension, started on flagyl by ICU team for gastroenteritis, cont for total 5 days.  DVT prophylaxis with Lovenox subcutaneous   All the records are reviewed and case discussed with Care Management/Social Worker. Management plans discussed with the patient, family and they are in agreement.  CODE STATUS: Full.  TOTAL TIME TAKING CARE OF THIS PATIENT: 35 minutes.    POSSIBLE D/C IN 1-2 DAYS, DEPENDING ON CLINICAL CONDITION.   Vaughan Basta M.D on 10/09/2016   Between 7am to 6pm - Pager - [phone]  After 6pm go to [email or web address] - password [email or web address] [facility]  Office  [phone]  CC: Primary care physician; Donnie Coffin, MD  Note: This dictation was prepared with Dragon dictation along with smaller phrase technology. Any transcriptional errors that result from this process are unintentional.

## 2016-10-09 NOTE — Progress Notes (Signed)
Patient no longer on Neo for hypotension.  BP has stabilized and is WNL.  No complaints of chest pain. Patient able to make needs known. 4 loose stools this shift. C-diff negative. Lab draw this am.  Will continue to monitor.   .Marland Kitchen

## 2016-10-09 NOTE — Progress Notes (Signed)
Pt has remained alert and oriented with no c/o pain. Pt has remained on RA, SpO2 > 95%. Lung sounds clear to auscultation. BP has been appropriate and Neo was turned off 0630 this am. Pt expresses readiness to go home.

## 2016-10-10 ENCOUNTER — Encounter: Payer: Self-pay | Admitting: Infectious Diseases

## 2016-10-10 LAB — GLUCOSE, CAPILLARY
Glucose-Capillary: 185 mg/dL — ABNORMAL HIGH (ref 65–99)
Glucose-Capillary: 168 mg/dL — ABNORMAL HIGH (ref 65–99)

## 2016-10-10 LAB — PROCALCITONIN

## 2016-10-10 LAB — HIV 1/2 AB DIFFERENTIATION
HIV 1 AB: POSITIVE — AB
HIV 2 Ab: NEGATIVE

## 2016-10-10 MED ORDER — ENSURE ENLIVE PO LIQD: 237.0000 mL | Freq: Two times a day (BID) | ORAL | 12 refills | Status: DC

## 2016-10-10 MED ORDER — METRONIDAZOLE 500 MG PO TABS: 500.0000 mg | ORAL_TABLET | Freq: Three times a day (TID) | ORAL | 0 refills | Status: AC

## 2016-10-10 NOTE — Discharge Summary (Signed)
[facility] [place] NAME: Cory Campbell    MR#:  [id number]  DATE OF BIRTH:  [birth year]  DATE OF ADMISSION:  10/06/2016 ADMITTING PHYSICIAN: Nicholes Mango, MD  DATE OF DISCHARGE: 10/10/2016   PRIMARY CARE PHYSICIAN: Donnie Coffin, MD    ADMISSION DIAGNOSIS:  Nonspecific chest pain [R07.9]  DISCHARGE DIAGNOSIS:  Active Problems:   Unstable angina ([place])   Hypotension   SECONDARY DIAGNOSIS:   Past Medical History:  Diagnosis Date  . Abrasion or friction burn of foot and toe(s), without mention of infection   . Acid reflux   . Adopted   . Allergic rhinitis, cause unspecified   . Anxiety   . Arthritis   . Arthrogryposis   . Asthma   . Bladder wall thickening   . BPH (benign prostatic hyperplasia)   . Chest pain, unspecified   . Chronic pain syndrome   . Depressive disorder, not elsewhere classified   . Diabetes mellitus without complication ([place])   . Diverticulitis of colon (without mention of hemorrhage)(562.11)   . HTN (hypertension)   . Hyperlipidemia   . Hypogonadism in male   . Myalgia and myositis, unspecified   . OAB (overactive bladder)   . Obesity   . Other and unspecified noninfectious gastroenteritis and colitis(558.9)   . Other convulsions   . Other psoriasis   . 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 Premature baby    born premature  . Sleep apnea   . 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 or other cause, without mention of thyrotoxic crisis or storm    hyperthyroidism  . TIA (transient ischemic attack)   . Type II or unspecified type diabetes mellitus with neurological manifestations, not stated as uncontrolled(250.60)     HOSPITAL COURSE:    Cory Campbell a 36 y.o. malewith a known history of obstructive sleep apnea, coronary artery disease, chronic pain syndrome arthrogryposis, insulin requiring diabetes mellitus, essential hypertension and multiple other medical problems  is presenting to the ED with a chief complaint of intractablechest pain. Patient started having left-sided chest pain in the inframammary area 12 noon which was radiating to the left shoulder and associated with shortness of breath. Sublingual nitroglycerin in the ED did not relieve the pain so patient was started on nitroglycerin drip. Initial troponin is negative.   # UNSTABLE ANGINA with history of coronary artery disease Monitored on telemetry serial troponin are negative cycle cardiac biomarkers- remained stable. reviewed echocardiogram Consult to cardiology-[facility] appreciated, ESR is 2, - suggested PAIN IS NOT CARDIAC Check fasting lipid panel- LDL 112 Aspirin, beta blocker and statin   Stopped nitro drip and added ibuprophen round the clock.   LIKELY THE PAIN IS DUE TO HIS MUSCULAR DISORDER, he follows cronically in pain clinic in [facility].  #Small pericardial effusion on CT chest  negative echocardiogram Cardiac consult appreciated Negative echo to r/o pericarditis.  #Essential hypertension Held meds due to hypotension, was on levophed drip, resolved now, will start BP meds as needed.  His SBP in range of 120-140 with only spironolactone and HR is normal, so will not add any meds.  Follow with PMD for further check.  #Insulin requiring diabetes mellitus Stable now. Home meds.  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   He claims to be taking dilaudid, which was prescribed 2 weeks ago from there, but his urine on admission is negative  for opioids, which turned positive after receiving here, on 3 rd day, so it is questionable , if he really takes it or not and WHAT HE DOES with his meds??  #Obstructive sleep apnea continue CPAP daily at bedtime  #Tobacco abuse disorder Counseled patient to stop smoking for 5 minutes. Patient is agreeable to nicotine patch.  # C/o diarrhea   Checked  C diff- negative.   Still due to hypotension, started on flagyl by ICU team for gastroenteritis, cont for total 5 days.  DVT prophylaxis with Lovenox subcutaneous  DISCHARGE CONDITIONS:   Stable.  CONSULTS OBTAINED:  Treatment Team:  Wendall Stade, MD  DRUG ALLERGIES:   Allergies  Allergen Reactions  . Penicillins Itching, Rash and Hives    Has patient had a PCN reaction causing immediate rash, facial/tongue/throat swelling, SOB or lightheadedness with hypotension: Yes Has patient had a PCN reaction causing severe rash involving mucus membranes or skin necrosis: No Has patient had a PCN reaction that required hospitalization No Has patient had a PCN reaction occurring within the last 10 years: No If all of the above answers are "NO", then may proceed with Cephalosporin use. Has patient had a PCN reaction causing immediate rash, facial/tongue/throat swelling, SOB or lightheadedness with hypotension: Yes Has patient had a PCN reaction causing severe rash involving mucus membranes or skin necrosis: No Has patient had a PCN reaction that required hospitalization No Has patient had a PCN reaction occurring within the last 10 years: No If all of the above answers are "NO", then may proceed with Cephalosporin use.  . Erythromycin Base Itching and Rash  . Erythromycin Base Itching and Rash    DISCHARGE MEDICATIONS:   Current Discharge Medication List    START taking these medications   Details  feeding supplement, ENSURE ENLIVE, (ENSURE ENLIVE) LIQD Take 237 mLs by mouth 2 (two) times daily between meals. Qty: 237 mL, Refills: 12    metroNIDAZOLE (FLAGYL) 500 MG tablet Take 1 tablet (500 mg total) by mouth every 8 (eight) hours. Qty: 9 tablet, Refills: 0      CONTINUE these medications which have NOT CHANGED   Details  acyclovir (ZOVIRAX) 400 MG tablet Take 400 mg by mouth 2 (two) times daily.    albuterol (PROVENTIL HFA;VENTOLIN HFA) 108 (90 BASE) MCG/ACT inhaler Inhale 2  puffs into the lungs every 4 (four) hours as needed for wheezing or shortness of breath.     albuterol (PROVENTIL) (2.5 MG/3ML) 0.083% nebulizer solution Take 2.5 mg by nebulization every 4 (four) hours as needed for wheezing or shortness of breath.    alfuzosin (UROXATRAL) 10 MG 24 hr tablet Take 10 mg by mouth daily.    aspirin 81 MG tablet Take 81 mg by mouth daily.    baclofen (LIORESAL) 10 MG tablet Take 10 mg by mouth 3 (three) times daily.    beclomethasone (QVAR) 80 MCG/ACT inhaler Inhale 2 puffs into the lungs 2 (two) times daily.     Buprenorphine (BUTRANS) 15 MCG/HR PTWK Place 15 mg onto the skin once a week. Saturday    citalopram (CELEXA) 20 MG tablet Take 20 mg by mouth daily.    diclofenac sodium (VOLTAREN) 1 % GEL Apply 2-4 g topically 4 (four) times daily as needed. For pain.    dicyclomine (BENTYL) 20 MG tablet Take 20 mg by mouth every 6 (six) hours.    DOCOSAHEXAENOIC ACID PO Take 1 tablet by mouth daily.     famotidine (PEPCID) 20 MG tablet Take  20 mg by mouth 2 (two) times daily.     finasteride (PROSCAR) 5 MG tablet Take 5 mg by mouth daily.    fluticasone (FLONASE) 50 MCG/ACT nasal spray Place 2 sprays into the nose daily.    hydrocortisone cream 0.5 % Apply 1 application topically 2 (two) times daily.    !! HYDROmorphone (DILAUDID) 2 MG tablet Take 2 mg by mouth every 12 (twelve) hours. Refills: 0    !! HYDROmorphone (DILAUDID) 4 MG tablet Take 1 tablet (4 mg total) by mouth every 3 (three) hours as needed. Qty: 12 tablet, Refills: 0    ibuprofen (ADVIL,MOTRIN) 400 MG tablet Take 400 mg by mouth every 6 (six) hours as needed.    Insulin Degludec (TRESIBA FLEXTOUCH) 200 UNIT/ML SOPN Inject 100 Units into the skin daily at 10 pm.    ketoconazole (NIZORAL) 2 % shampoo Apply 1 application topically 2 (two) times a week. tues and thursday    loratadine (CLARITIN) 10 MG tablet Take 10 mg by mouth daily.    mirtazapine (REMERON) 45 MG tablet Take 45 mg by  mouth at bedtime.    Multiple Vitamin (MULTIVITAMIN) tablet Take 1 tablet by mouth daily.    naloxone (NARCAN) nasal spray 4 mg/0.1 mL One spray in either nostril once for known/suspected opioid overdose. May repeat every 2-3 minutes in alternating nostril til EMS arrives    nitroGLYCERIN (NITROSTAT) 0.4 MG SL tablet Place 1 tablet (0.4 mg total) under the tongue every 5 (five) minutes as needed for chest pain. Call 911 if chest is not resolved w/ 3 tabs. Qty: 25 tablet, Refills: 1    nortriptyline (PAMELOR) 25 MG capsule Take 50 mg by mouth at bedtime.    NOVOLOG FLEXPEN 100 UNIT/ML FlexPen Inject 10 Units into the skin See admin instructions. With every meal and every snack Refills: 4    nystatin-triamcinolone (MYCOLOG II) cream Apply 1 application topically 2 (two) times daily.    omeprazole (PRILOSEC) 20 MG capsule Take 20 mg by mouth daily.    ondansetron (ZOFRAN-ODT) 4 MG disintegrating tablet Take 4 mg by mouth every 8 (eight) hours as needed for nausea or vomiting.    polyethylene glycol (MIRALAX / GLYCOLAX) packet Take 17 g by mouth 3 (three) times daily.     potassium chloride SA (K-DUR,KLOR-CON) 20 MEQ tablet take 1 tablet by mouth once daily if needed Qty: 90 tablet, Refills: 3    prednisoLONE acetate (PRED FORTE) 1 % ophthalmic suspension Place 1 drop into the right eye daily.     RA MELATONIN 1 MG SUBL Take 1 mg by mouth at bedtime.  Refills: 0    solifenacin (VESICARE) 10 MG tablet Take 10 mg by mouth daily.    spironolactone (ALDACTONE) 25 MG tablet Take 25 mg by mouth daily.    tamsulosin (FLOMAX) 0.4 MG CAPS Take 0.4 mg by mouth daily.    tiZANidine (ZANAFLEX) 4 MG capsule Take 4 mg by mouth 3 (three) times daily as needed.     zolpidem (AMBIEN) 10 MG tablet Take 10 mg by mouth at bedtime.    nystatin (MYCOSTATIN) powder Apply topically 3 (three) times daily. For 10 days Qty: 15 g, Refills: 0    pravastatin (PRAVACHOL) 80 MG tablet take 1 tablet by mouth  at bedtime Qty: 30 tablet, Refills: 3     !! - Potential duplicate medications found. Please discuss with provider.    STOP taking these medications     amLODipine (NORVASC) 10 MG tablet  chlorthalidone (HYGROTON) 25 MG tablet      cloNIDine (CATAPRES - DOSED IN MG/24 HR) 0.1 mg/24hr patch      hydrALAZINE (APRESOLINE) 100 MG tablet      isosorbide mononitrate (IMDUR) 30 MG 24 hr tablet      lisinopril (PRINIVIL,ZESTRIL) 40 MG tablet      metoprolol (LOPRESSOR) 100 MG tablet          DISCHARGE INSTRUCTIONS:    Follow with PMD in 1-2 weeks.  If you experience worsening of your admission symptoms, develop shortness of breath, life threatening emergency, suicidal or homicidal thoughts you must seek medical attention immediately by calling 911 or calling your MD immediately  if symptoms less severe.  You Must read complete instructions/literature along with all the possible adverse reactions/side effects for all the Medicines you take and that have been prescribed to you. Take any new Medicines after you have completely understood and accept all the possible adverse reactions/side effects.   Please note  You were cared for by a hospitalist during your hospital stay. If you have any questions about your discharge medications or the care you received while you were in the hospital after you are discharged, you can call the unit and asked to speak with the hospitalist on call if the hospitalist that took care of you is not available. Once you are discharged, your primary care physician will handle any further medical issues. Please note that NO REFILLS for any discharge medications will be authorized once you are discharged, as it is imperative that you return to your primary care physician (or establish a relationship with a primary care physician if you do not have one) for your aftercare needs so that they can reassess your need for medications and monitor your lab  values.    Today   CHIEF COMPLAINT:   Chief Complaint  Patient presents with  . Chest Pain    HISTORY OF PRESENT ILLNESS:  Cory Campbell  is a 36 y.o. male with a known history of obstructive sleep apnea, coronary artery disease, chronic pain syndrome arthrogryposis, insulin requiring diabetes mellitus, essential hypertension and multiple other medical problems is presenting to the ED with a chief complaint of intractablechest pain. Patient started having left-sided chest pain in the inframammary area 12 noon which was radiating to the left shoulder and associated with shortness of breath. Sublingual nitroglycerin in the ED did not relieve the pain so patient was started on nitroglycerin drip. Initial troponin is negative. EKG with no significant changes After nitroglycerin drip is started patient's pain was better   VITAL SIGNS:  Blood pressure 138/89, pulse 87, temperature 97.8 F (36.6 C), temperature source Oral, resp. rate 16, height _0  (1.626 m), weight 74.3 kg (163 lb 12.8 oz), SpO2 98 %.  I/O:    Intake/Output Summary (Last 24 hours) at 10/10/16 1037 Last data filed at 10/10/16 0954  Gross per 24 hour  Intake              800 ml  Output             2530 ml  Net            -1730 ml    PHYSICAL EXAMINATION:   GENERAL:  36 y.o.-year-old patient lying in the bed with no acute distress.  EYES: Pupils equal, round, reactive to light and accommodation. No scleral icterus. Extraocular muscles intact.  HEENT: Head atraumatic, normocephalic. Oropharynx and nasopharynx clear.  NECK:  Supple, no jugular  venous distention. No thyroid enlargement, no tenderness.  LUNGS: Normal breath sounds bilaterally, no wheezing, rales,rhonchi or crepitation. No use of accessory muscles of respiration.  CARDIOVASCULAR: S1, S2 normal. No murmurs, rubs, or gallops.  ABDOMEN: Soft, nontender, nondistended. Bowel sounds present. No organomegaly or mass.  EXTREMITIES:  Congenital limbs  abnormalities absent and short fingers, No pedal edema, cyanosis, or clubbing.  NEUROLOGIC: Cranial nerves II through XII are intact. Muscle strength 5/5 in all extremities. Sensation intact. Gait not checked.  PSYCHIATRIC: The patient is alert and oriented x 3.  SKIN: No obvious rash, lesion, or ulcer.   DATA REVIEW:   CBC  Recent Labs Lab 10/09/16 0554  WBC 10.3  HGB 12.4*  HCT 36.7*  PLT 229    Chemistries   Recent Labs Lab 10/08/16 1818 10/09/16 0554  NA 137 139  K 4.5 3.4*  CL 110 112*  CO2 22 23  GLUCOSE 167* 129*  BUN 25* 12  CREATININE 0.91 0.67  CALCIUM 6.8* 6.7*  MG  --  1.6*  AST 16  --   ALT 8*  --   ALKPHOS 52  --   BILITOT 0.5  --     Cardiac Enzymes  Recent Labs Lab 10/09/16 0554  TROPONINI <0.03    Microbiology Results  Results for orders placed or performed during the hospital encounter of 10/06/16  C difficile quick scan w PCR reflex     Status: None   Collection Time: 10/08/16 11:40 AM  Result Value Ref Range Status   C Diff antigen NEGATIVE NEGATIVE Final   C Diff toxin NEGATIVE NEGATIVE Final   C Diff interpretation No C. difficile detected.  Final  CULTURE, BLOOD (ROUTINE X 2) w Reflex to ID Panel     Status: None (Preliminary result)   Collection Time: 10/08/16  6:08 PM  Result Value Ref Range Status   Specimen Description BLOOD RIGHT ANTECUBITAL  Final   Special Requests   Final    BOTTLES DRAWN AEROBIC AND ANAEROBIC Blood Culture adequate volume   Culture NO GROWTH 2 DAYS  Final   Report Status PENDING  Incomplete  CULTURE, BLOOD (ROUTINE X 2) w Reflex to ID Panel     Status: None (Preliminary result)   Collection Time: 10/08/16  6:21 PM  Result Value Ref Range Status   Specimen Description BLOOD BLOOD RIGHT FOREARM  Final   Special Requests   Final    BOTTLES DRAWN AEROBIC AND ANAEROBIC Blood Culture adequate volume   Culture NO GROWTH 2 DAYS  Final   Report Status PENDING  Incomplete  MRSA PCR Screening     Status: None    Collection Time: 10/09/16  9:15 AM  Result Value Ref Range Status   MRSA by PCR NEGATIVE NEGATIVE Final    Comment:        The GeneXpert MRSA Assay (FDA approved for NASAL specimens only), is one component of a comprehensive MRSA colonization surveillance program. It is not intended to diagnose MRSA infection nor to guide or monitor treatment for MRSA infections.     RADIOLOGY:  No results found.  EKG:   Orders placed or performed during the hospital encounter of 10/06/16  . ED EKG  . ED EKG  . EKG 12-Lead  . EKG 12-Lead  . EKG 12-lead  . EKG 12-Lead  . EKG 12-Lead  . EKG 12-Lead  . EKG 12-Lead  . EKG 12-Lead  . EKG 12-Lead  . EKG 12-Lead  . EKG 12-Lead  Management plans discussed with the patient, family and they are in agreement.  CODE STATUS:     Code Status Orders        Start     Ordered   10/06/16 1954  Full code  Continuous     10/06/16 1953    Code Status History    Date Active Date Inactive Code Status Order ID Comments User Context   This patient has a current code status but no historical code status.      TOTAL TIME TAKING CARE OF THIS PATIENT: 35 minutes.    Vaughan Basta M.D on 10/10/2016 at 10:37 AM  Between 7am to 6pm - Pager - [phone]  After 6pm go to [email or web address] - password [email or web address] [facility]  Office  [phone]  CC: Primary care physician; Donnie Coffin, MD   Note: This dictation was prepared with Dragon dictation along with smaller phrase technology. Any transcriptional errors that result from this process are unintentional.

## 2016-10-10 NOTE — Pre-Procedure Instructions (Signed)
Notified of +Hiv result  I have contacted DIS worker Shawn Stalliera Stackhouse to notify her of the result and she will contact patient

## 2016-10-10 NOTE — Progress Notes (Signed)
ID note Notified of + HIV ab testing. He has been discharged. I have contacted Shawn Stalliera Stackhouse with DIS to contact and inform patient of his test.  I can see him in next 1-2 weeks in clinic.

## 2016-10-11 ENCOUNTER — Telehealth: Payer: Self-pay | Admitting: Infectious Disease

## 2016-10-11 NOTE — Telephone Encounter (Signed)
Patient with HIV antibody test. Presumed NEW DIagnosis  Tammy, Marcelino DusterMichelle can we send DIS to get him into care> he has Medicaid so we could easily do rapid start of meds on him after we draw labs

## 2016-10-12 NOTE — Progress Notes (Signed)
I also called the [facility] and [facility] pharmacies, noted as his pharmacy in our records, to get his contact info. The number [facility] providded me- is not an existing number, [facility] gave me [phone] I have left a voice mail to call me back on that number.

## 2016-10-12 NOTE — Progress Notes (Signed)
After pt is discharges, we were made aware of HIV test result positive. ID clinic has contacted DIS. I tried calling pt on both the numbers in our chart for him and spouce, but none of them is going through.  I called [facility] and left a msg for the nurse to call me back,as it seems pt follows there regularly for pain meds. I will try to get updated contact details from there.

## 2016-10-13 LAB — CULTURE, BLOOD (ROUTINE X 2)
Culture: NO GROWTH
Culture: NO GROWTH
SPECIAL REQUESTS: ADEQUATE
SPECIAL REQUESTS: ADEQUATE

## 2016-10-13 NOTE — Progress Notes (Signed)
Received a call back form nurse practitioner from [facility] ( Dr. Fayrene FearingJames office.) at 10:20 am on 10/13/16 I did speak to nurse Ms.Linda Cristy FriedlanderFlorence I informed her about pt's HIV positive result and to arranage for further care or follow ups. As per her - she will let Dr. Fayrene FearingJames know. Per her- usually pt communicate with them by Mychart emails, not by phone. She also asked details of Dr. Jarrett AblesFitzgerald's office number, so she will send him Mychart message to contact the office to make appointment.

## 2016-10-28 NOTE — Progress Notes (Signed)
Cardiology Office Note  Date:  10/30/2016   ID:  Cory Campbell, DOB 02-Jul-1980, MRN [id number]  PCP:  Emogene Morgan, MD   Chief Complaint  Patient presents with  . other    Cardiac clearance for colostomy reversal. Meds reviewed by the pt. verbally. "doing well."     HPI:  Mr. Bazzi is a 36 year-old gentleman with a history of  Arthrogryposis multiplex congenita,  chronic pain in his legs,  asthma,  hypertension,  diabetes, HBA1C 8.7 severe obstructive sleep apnea on CPAP since April 2012,  obesity,  seizure disorder,  psoriasis with several  evaluations in the emergency room for chest pain on April 23, 06/01/2011.  History of frequent falls, walks with leg braces and canes Prior history of atypical chest pain, often exacerbated by falls Ostomy Question of HIV infection based on notes from primary care, they were retesting He presents for routine followup of his hypertension and chest pain  In follow-up today he reports that he is doing well, Denies any recent episodes of chest pain on exertion Last episode of chest pain with workup June 2018 after a fall, felt to be musculoskeletal  Reports his blood pressure is mildly elevated, compliant with his medications BP at home: 150s systolic havingDiarrhea Had ostomy 2 years ago Now scheduled to have ostomy reversal  Lab work reviewed hemoglobin A1c 8.7 Last potassium 3.4  EKG shows normal sinus rhythm with rate 100 bpm, no significant ST or T-wave changes  Other past medical history reviewed He reported having Diarrhea in November 2017, Resolved Goes to [place] [facility], for primary care He does have sleep apnea, uses CPAP  Previously seen in the emergency room for chest pain  Pain was somewhat atypical, EKG was unchanged, cardiac enzymes negative.   Prior visits to the emergency room  at [facility] for abdominal pain. He was started on MiraLAX for constipation. emergency room for chest pain on 02/09/2012. Workup was  essentially negative. Chest pain radiates to the right, the left, up to the shoulder, sometimes downward. Symptoms present at rest and with exertion.  Blood pressure has been relatively stable.    PMH:   has a past medical history of Abrasion or friction burn of foot and toe(s), without mention of infection; Acid reflux; Adopted; Allergic rhinitis, cause unspecified; Anxiety; Arthritis; Arthrogryposis; Asthma; Bladder wall thickening; BPH (benign prostatic hyperplasia); Chest pain, unspecified; Chronic pain syndrome; Depressive disorder, not elsewhere classified; Diabetes mellitus without complication (HCC); Diverticulitis of colon (without mention of hemorrhage)(562.11); ED (erectile dysfunction); Herpes genitalis; HIV infection (HCC); HTN (hypertension); Hyperlipidemia; Hypogonadism in male; Myalgia and myositis, unspecified; OAB (overactive bladder); Obesity; Other and unspecified noninfectious gastroenteritis and colitis(558.9); Other convulsions; Other psoriasis; 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  PSH:    Past Surgical History:  Procedure Laterality Date  . APPENDECTOMY    . COLOSTOMY    . CORNEAL TRANSPLANT    . feet surgery     both  . HAND SURGERY     left and right  . leg surgery     left and right  . ORTHOPEDIC SURGERY     hands, feet, knees, legs  . tubes in ears     both    Current Outpatient Prescriptions  Medication Sig Dispense Refill  . acyclovir (  ZOVIRAX) 400 MG tablet Take 400 mg by mouth 2 (two) times daily.    Marland Kitchen albuterol (PROVENTIL HFA;VENTOLIN HFA) 108 (90 BASE) MCG/ACT inhaler Inhale 2 puffs into the lungs every 4 (four) hours as needed for wheezing or shortness of  breath.     Marland Kitchen albuterol (PROVENTIL) (2.5 MG/3ML) 0.083% nebulizer solution Take 2.5 mg by nebulization every 4 (four) hours as needed for wheezing or shortness of breath.    . alfuzosin (UROXATRAL) 10 MG 24 hr tablet Take 10 mg by mouth daily.    Marland Kitchen amLODipine (NORVASC) 10 MG tablet Take 10 mg by mouth daily.    Marland Kitchen aspirin 81 MG tablet Take 81 mg by mouth daily.    . baclofen (LIORESAL) 10 MG tablet Take 10 mg by mouth 3 (three) times daily.    . beclomethasone (QVAR) 80 MCG/ACT inhaler Inhale 2 puffs into the lungs 2 (two) times daily.     . Buprenorphine (BUTRANS) 15 MCG/HR PTWK Place 15 mg onto the skin once a week. Saturday    . citalopram (CELEXA) 20 MG tablet Take 20 mg by mouth daily.    . cloNIDine (CATAPRES - DOSED IN MG/24 HR) 0.1 mg/24hr patch Place 0.1 mg onto the skin once a week.    . diclofenac sodium (VOLTAREN) 1 % GEL Apply 2-4 g topically 4 (four) times daily as needed. For pain.    Marland Kitchen dicyclomine (BENTYL) 20 MG tablet Take 20 mg by mouth every 6 (six) hours.    . DOCOSAHEXAENOIC ACID PO Take 1 tablet by mouth daily.     . famotidine (PEPCID) 20 MG tablet Take 20 mg by mouth 2 (two) times daily.     . feeding supplement, ENSURE ENLIVE, (ENSURE ENLIVE) LIQD Take 237 mLs by mouth 2 (two) times daily between meals. 237 mL 12  . finasteride (PROSCAR) 5 MG tablet Take 5 mg by mouth daily.    . fluticasone (FLONASE) 50 MCG/ACT nasal spray Place 2 sprays into the nose daily.    . hydrocortisone cream 0.5 % Apply 1 application topically 2 (two) times daily.    Marland Kitchen HYDROmorphone (DILAUDID) 4 MG tablet Take 1 tablet (4 mg total) by mouth every 3 (three) hours as needed. (Patient taking differently: Take 4 mg by mouth 2 (two) times daily as needed. ) 12 tablet 0  . ibuprofen (ADVIL,MOTRIN) 400 MG tablet Take 400 mg by mouth every 6 (six) hours as needed.    . Insulin Degludec (TRESIBA FLEXTOUCH) 200 UNIT/ML SOPN Inject 100 Units into the skin daily at 10 pm.    . ketoconazole (NIZORAL) 2 %  shampoo Apply 1 application topically 2 (two) times a week. tues and thursday    . lisinopril (PRINIVIL,ZESTRIL) 40 MG tablet Take 40 mg by mouth daily.    Marland Kitchen loratadine (CLARITIN) 10 MG tablet Take 10 mg by mouth daily.    . metoprolol tartrate (LOPRESSOR) 100 MG tablet Take 100 mg by mouth 2 (two) times daily.    . mirtazapine (REMERON) 45 MG tablet Take 45 mg by mouth at bedtime.    . Multiple Vitamin (MULTIVITAMIN) tablet Take 1 tablet by mouth daily.    . naloxone (NARCAN) nasal spray 4 mg/0.1 mL One spray in either nostril once for known/suspected opioid overdose. May repeat every 2-3 minutes in alternating nostril til EMS arrives    . nitroGLYCERIN (NITROSTAT) 0.4 MG SL tablet Place 1 tablet (0.4 mg total) under the tongue every 5 (five) minutes as needed for chest pain.  Call 911 if chest is not resolved w/ 3 tabs. 25 tablet 1  . nortriptyline (PAMELOR) 25 MG capsule Take 50 mg by mouth at bedtime.    Marland Kitchen NOVOLOG FLEXPEN 100 UNIT/ML FlexPen Inject 10 Units into the skin See admin instructions. With every meal and every snack  4  . nystatin (MYCOSTATIN) powder Apply topically 3 (three) times daily. For 10 days 15 g 0  . nystatin-triamcinolone (MYCOLOG II) cream Apply 1 application topically 2 (two) times daily.    Marland Kitchen omeprazole (PRILOSEC) 20 MG capsule Take 20 mg by mouth daily.    . ondansetron (ZOFRAN-ODT) 4 MG disintegrating tablet Take 4 mg by mouth every 8 (eight) hours as needed for nausea or vomiting.    . polyethylene glycol (MIRALAX / GLYCOLAX) packet Take 17 g by mouth 3 (three) times daily.     . potassium chloride SA (K-DUR,KLOR-CON) 20 MEQ tablet take 1 tablet by mouth once daily if needed (Patient taking differently: take 1 tablet by mouth once daily) 90 tablet 3  . pravastatin (PRAVACHOL) 80 MG tablet take 1 tablet by mouth at bedtime 30 tablet 3  . prednisoLONE acetate (PRED FORTE) 1 % ophthalmic suspension Place 1 drop into the right eye daily.     Marland Kitchen RA MELATONIN 1 MG SUBL Take 1  mg by mouth at bedtime.   0  . solifenacin (VESICARE) 10 MG tablet Take 10 mg by mouth daily.    . tamsulosin (FLOMAX) 0.4 MG CAPS Take 0.4 mg by mouth daily.    Marland Kitchen tiZANidine (ZANAFLEX) 4 MG capsule Take 4 mg by mouth 3 (three) times daily as needed.     . zolpidem (AMBIEN) 10 MG tablet Take 10 mg by mouth at bedtime.     No current facility-administered medications for this visit.      Allergies:   Penicillins; Erythromycin base; and Erythromycin base   Social History:  The patient  reports that he has been smoking.  He has smoked for the past 0.00 years. He has never used smokeless tobacco. He reports that he does not drink alcohol or use drugs.   Family History:   Family history is unknown by patient.    Review of Systems: Review of Systems  Constitutional:       20 pound weight gain  Respiratory: Negative.   Cardiovascular: Negative.   Gastrointestinal: Positive for abdominal pain and diarrhea.  Musculoskeletal: Negative.        Unsteady gait, leg weakness  Neurological: Negative.   Psychiatric/Behavioral: Negative.   All other systems reviewed and are negative.    PHYSICAL EXAM: VS:  Ht  (1.626 m)   Wt 181 lb (82.1 kg)   BMI 31.07 kg/m  , BMI Body mass index is 31.07 kg/m. GEN: Well nourished, well developed, in no acute distress  HEENT: normal  Neck: no JVD, carotid bruits, or masses Cardiac: RRR; no murmurs, rubs, or gallops,no edema  Respiratory:  clear to auscultation bilaterally, normal work of breathing GI: soft, nontender, nondistended, + BS, ostomy in place MS: no deformity  , Atrophy lower extremities/ Muscle wasting, braces in place Skin: warm and dry, no rash Neuro:  Strength and sensation are intact Psych: euthymic mood, full affect     Recent Labs: 10/08/2016: ALT 8 10/09/2016: BUN 12; Creatinine, Ser 0.67; Hemoglobin 12.4; Magnesium 1.6; Platelets 229; Potassium 3.4; Sodium 139    Lipid Panel Lab Results  Component Value Date   CHOL 163  10/07/2016   HDL 26 (  L) 10/07/2016   LDLCALC 112 (H) 10/07/2016   TRIG 126 10/07/2016      Wt Readings from Last 3 Encounters:  10/30/16 181 lb (82.1 kg)  10/08/16 163 lb 12.8 oz (74.3 kg)  08/25/16 161 lb (73 kg)       ASSESSMENT AND PLAN:  Essential hypertension - Plan: EKG 12-Lead Blood pressure mildly elevated but with surgery pending, will not change his medications at this time. Likely have weight loss, and be kept NPO causing drop in pressures We can readjust medications after surgery  Class 1 obesity due to excess calories without serious comorbidity with body mass  Weight with dramatic drop off now up slightly Scheduled for ostomy repair  Frequent falls Denies any recent falls  Prior falls leading to chest pain  Obstructive sleep apnea on CPAP Compliant with his CPAP  Chest pain, unspecified type Previous history of atypical chest pain No further workup at this time, denies having any further pain   Type 2 diabetes mellitus with complication, without long-term current use of insulin (HCC) working with [facility] Recommended he avoid carbohydrates Unable to exercise secondary to debility   preop cardiovascular No further testing needed, acceptable risk for GI/ostomy surgery   Total encounter time more than 25 minutes  Greater than 50% was spent in counseling and coordination of care with the patient  Disposition:   F/U  12 months   No orders of the defined types were placed in this encounter.    Signed, Dossie Arbour, M.D., Ph.D. 10/30/2016  [facility] [place], [place] [phone]

## 2016-10-30 ENCOUNTER — Encounter: Payer: Self-pay | Admitting: Cardiovascular Disease

## 2016-10-30 ENCOUNTER — Ambulatory Visit (INDEPENDENT_AMBULATORY_CARE_PROVIDER_SITE_OTHER): Payer: Medicaid Other | Admitting: Cardiovascular Disease

## 2016-10-30 VITALS — BP 158/92 | Ht 64.0 in | Wt 181.0 lb

## 2016-10-30 DIAGNOSIS — E118 Type 2 diabetes mellitus with unspecified complications: Secondary | ICD-10-CM | POA: Diagnosis not present

## 2016-10-30 DIAGNOSIS — I10 Essential (primary) hypertension: Secondary | ICD-10-CM

## 2016-10-30 DIAGNOSIS — R296 Repeated falls: Secondary | ICD-10-CM | POA: Diagnosis not present

## 2016-10-30 DIAGNOSIS — R079 Chest pain, unspecified: Secondary | ICD-10-CM

## 2016-10-30 DIAGNOSIS — R0602 Shortness of breath: Secondary | ICD-10-CM

## 2016-10-30 DIAGNOSIS — Z9989 Dependence on other enabling machines and devices: Secondary | ICD-10-CM | POA: Diagnosis not present

## 2016-10-30 DIAGNOSIS — G4733 Obstructive sleep apnea (adult) (pediatric): Secondary | ICD-10-CM | POA: Diagnosis not present

## 2016-10-30 NOTE — Patient Instructions (Signed)

## 2016-11-01 NOTE — Addendum Note (Signed)
Addended by: Festus Aloe on: 11/01/2016 10:32 AM   Modules accepted: Orders

## 2016-11-15 ENCOUNTER — Other Ambulatory Visit: Payer: Self-pay | Admitting: Cardiovascular Disease

## 2016-11-19 DIAGNOSIS — B20 Human immunodeficiency virus [HIV] disease: Secondary | ICD-10-CM | POA: Diagnosis present

## 2016-11-19 DIAGNOSIS — Z21 Asymptomatic human immunodeficiency virus [HIV] infection status: Secondary | ICD-10-CM | POA: Diagnosis present

## 2016-11-25 ENCOUNTER — Encounter: Payer: Self-pay | Admitting: Emergency Medicine

## 2016-11-25 ENCOUNTER — Observation Stay
Admission: EM | Admit: 2016-11-25 | Discharge: 2016-11-26 | Disposition: A | Discharge status: Home / Self Care | Payer: Medicaid Other | Attending: Internal Medicine | Admitting: Internal Medicine

## 2016-11-25 ENCOUNTER — Emergency Department: Payer: Medicaid Other

## 2016-11-25 DIAGNOSIS — R112 Nausea with vomiting, unspecified: Secondary | ICD-10-CM | POA: Diagnosis not present

## 2016-11-25 DIAGNOSIS — M629 Disorder of muscle, unspecified: Secondary | ICD-10-CM | POA: Insufficient documentation

## 2016-11-25 DIAGNOSIS — B009 Herpesviral infection, unspecified: Secondary | ICD-10-CM | POA: Diagnosis not present

## 2016-11-25 DIAGNOSIS — I10 Essential (primary) hypertension: Principal | ICD-10-CM | POA: Insufficient documentation

## 2016-11-25 DIAGNOSIS — K219 Gastro-esophageal reflux disease without esophagitis: Secondary | ICD-10-CM | POA: Diagnosis not present

## 2016-11-25 DIAGNOSIS — B20 Human immunodeficiency virus [HIV] disease: Secondary | ICD-10-CM | POA: Diagnosis not present

## 2016-11-25 DIAGNOSIS — Z9989 Dependence on other enabling machines and devices: Secondary | ICD-10-CM | POA: Diagnosis not present

## 2016-11-25 DIAGNOSIS — R1032 Left lower quadrant pain: Secondary | ICD-10-CM | POA: Diagnosis not present

## 2016-11-25 DIAGNOSIS — G4733 Obstructive sleep apnea (adult) (pediatric): Secondary | ICD-10-CM | POA: Insufficient documentation

## 2016-11-25 DIAGNOSIS — Z7982 Long term (current) use of aspirin: Secondary | ICD-10-CM | POA: Insufficient documentation

## 2016-11-25 DIAGNOSIS — J45909 Unspecified asthma, uncomplicated: Secondary | ICD-10-CM | POA: Insufficient documentation

## 2016-11-25 DIAGNOSIS — F17200 Nicotine dependence, unspecified, uncomplicated: Secondary | ICD-10-CM | POA: Diagnosis not present

## 2016-11-25 DIAGNOSIS — F329 Major depressive disorder, single episode, unspecified: Secondary | ICD-10-CM | POA: Insufficient documentation

## 2016-11-25 DIAGNOSIS — Z79899 Other long term (current) drug therapy: Secondary | ICD-10-CM | POA: Diagnosis not present

## 2016-11-25 DIAGNOSIS — Z8673 Personal history of transient ischemic attack (TIA), and cerebral infarction without residual deficits: Secondary | ICD-10-CM | POA: Insufficient documentation

## 2016-11-25 DIAGNOSIS — E785 Hyperlipidemia, unspecified: Secondary | ICD-10-CM | POA: Insufficient documentation

## 2016-11-25 DIAGNOSIS — R109 Unspecified abdominal pain: Secondary | ICD-10-CM | POA: Diagnosis present

## 2016-11-25 DIAGNOSIS — Z7951 Long term (current) use of inhaled steroids: Secondary | ICD-10-CM | POA: Insufficient documentation

## 2016-11-25 DIAGNOSIS — N400 Enlarged prostate without lower urinary tract symptoms: Secondary | ICD-10-CM | POA: Insufficient documentation

## 2016-11-25 DIAGNOSIS — Z933 Colostomy status: Secondary | ICD-10-CM | POA: Diagnosis not present

## 2016-11-25 DIAGNOSIS — E1159 Type 2 diabetes mellitus with other circulatory complications: Secondary | ICD-10-CM | POA: Diagnosis present

## 2016-11-25 DIAGNOSIS — Z794 Long term (current) use of insulin: Secondary | ICD-10-CM | POA: Diagnosis not present

## 2016-11-25 DIAGNOSIS — E119 Type 2 diabetes mellitus without complications: Secondary | ICD-10-CM | POA: Diagnosis not present

## 2016-11-25 DIAGNOSIS — I152 Hypertension secondary to endocrine disorders: Secondary | ICD-10-CM | POA: Diagnosis present

## 2016-11-25 LAB — COMPREHENSIVE METABOLIC PANEL
ALK PHOS: 58 U/L (ref 38–126)
ALT: 21 U/L (ref 17–63)
AST: 29 U/L (ref 15–41)
Albumin: 3.9 g/dL (ref 3.5–5.0)
Anion gap: 13 (ref 5–15)
BILIRUBIN TOTAL: 0.4 mg/dL (ref 0.3–1.2)
BUN: 19 mg/dL (ref 6–20)
CALCIUM: 9.3 mg/dL (ref 8.9–10.3)
CHLORIDE: 95 mmol/L — AB (ref 101–111)
CO2: 27 mmol/L (ref 22–32)
CREATININE: 0.79 mg/dL (ref 0.61–1.24)
Glucose, Bld: 261 mg/dL — ABNORMAL HIGH (ref 65–99)
Potassium: 3.6 mmol/L (ref 3.5–5.1)
Sodium: 135 mmol/L (ref 135–145)
TOTAL PROTEIN: 7.4 g/dL (ref 6.5–8.1)

## 2016-11-25 LAB — GASTROINTESTINAL PANEL BY PCR, STOOL (REPLACES STOOL CULTURE)

## 2016-11-25 LAB — URINALYSIS, COMPLETE (UACMP) WITH MICROSCOPIC
BILIRUBIN URINE: NEGATIVE
Bacteria, UA: NONE SEEN
Hgb urine dipstick: NEGATIVE
KETONES UR: 20 mg/dL — AB
LEUKOCYTES UA: NEGATIVE
NITRITE: NEGATIVE
PH: 7 (ref 5.0–8.0)
Protein, ur: NEGATIVE mg/dL
SPECIFIC GRAVITY, URINE: 1.021 (ref 1.005–1.030)
SQUAMOUS EPITHELIAL / LPF: NONE SEEN

## 2016-11-25 LAB — GLUCOSE, CAPILLARY
Glucose-Capillary: 161 mg/dL — ABNORMAL HIGH (ref 65–99)
Glucose-Capillary: 226 mg/dL — ABNORMAL HIGH (ref 65–99)
Glucose-Capillary: 223 mg/dL — ABNORMAL HIGH (ref 65–99)

## 2016-11-25 LAB — URINE DRUG SCREEN, QUALITATIVE (ARMC ONLY)
Amphetamines, Ur Screen: NOT DETECTED
BARBITURATES, UR SCREEN: NOT DETECTED
BENZODIAZEPINE, UR SCRN: NOT DETECTED
COCAINE METABOLITE, UR ~~LOC~~: NOT DETECTED
Cannabinoid 50 Ng, Ur ~~LOC~~: NOT DETECTED
MDMA (Ecstasy)Ur Screen: NOT DETECTED
METHADONE SCREEN, URINE: NOT DETECTED
OPIATE, UR SCREEN: POSITIVE — AB
PHENCYCLIDINE (PCP) UR S: NOT DETECTED
Tricyclic, Ur Screen: POSITIVE — AB

## 2016-11-25 LAB — CBC
HCT: 43.7 % (ref 40.0–52.0)
Hemoglobin: 14.2 g/dL (ref 13.0–18.0)
MCH: 27.4 pg (ref 26.0–34.0)
MCHC: 32.6 g/dL (ref 32.0–36.0)
MCV: 84.1 fL (ref 80.0–100.0)
PLATELETS: 276 10*3/uL (ref 150–440)
RBC: 5.19 MIL/uL (ref 4.40–5.90)
RDW: 13.5 % (ref 11.5–14.5)
WBC: 7.6 10*3/uL (ref 3.8–10.6)

## 2016-11-25 LAB — LIPASE, BLOOD: Lipase: 34 U/L (ref 11–51)

## 2016-11-25 MED ORDER — MIRTAZAPINE 15 MG PO TABS
45.0000 mg | ORAL_TABLET | Freq: Every day | ORAL | Status: DC
  Administered 2016-11-25: 45 mg via ORAL
  Filled 2016-11-25: qty 3

## 2016-11-25 MED ORDER — DOCUSATE SODIUM 100 MG PO CAPS: 100.0000 mg | ORAL_CAPSULE | Freq: Two times a day (BID) | ORAL | Status: DC | PRN

## 2016-11-25 MED ORDER — FINASTERIDE 5 MG PO TABS
5.0000 mg | ORAL_TABLET | Freq: Every day | ORAL | Status: DC
  Administered 2016-11-25 – 2016-11-26 (×2): 5 mg via ORAL
  Filled 2016-11-25 (×2): qty 1

## 2016-11-25 MED ORDER — MELATONIN 5 MG PO TABS
2.5000 mg | ORAL_TABLET | Freq: Every day | ORAL | Status: DC
  Administered 2016-11-25: 2.5 mg via ORAL
  Filled 2016-11-25 (×2): qty 0.5

## 2016-11-25 MED ORDER — HYDROMORPHONE HCL 1 MG/ML IJ SOLN: 1.0000 mg | INTRAMUSCULAR | Status: DC | PRN

## 2016-11-25 MED ORDER — ONDANSETRON HCL 4 MG/2ML IJ SOLN
4.0000 mg | Freq: Once | INTRAMUSCULAR | Status: AC
  Administered 2016-11-25: 4 mg via INTRAVENOUS
  Filled 2016-11-25: qty 2

## 2016-11-25 MED ORDER — ACYCLOVIR 200 MG PO CAPS
400.0000 mg | ORAL_CAPSULE | Freq: Two times a day (BID) | ORAL | Status: DC
  Administered 2016-11-25 – 2016-11-26 (×3): 400 mg via ORAL
  Filled 2016-11-25 (×4): qty 2

## 2016-11-25 MED ORDER — ALBUTEROL SULFATE (2.5 MG/3ML) 0.083% IN NEBU: 2.5000 mg | INHALATION_SOLUTION | RESPIRATORY_TRACT | Status: DC | PRN

## 2016-11-25 MED ORDER — PROMETHAZINE HCL 25 MG/ML IJ SOLN: 25.0000 mg | Freq: Four times a day (QID) | INTRAMUSCULAR | Status: DC | PRN

## 2016-11-25 MED ORDER — ASPIRIN EC 81 MG PO TBEC
81.0000 mg | DELAYED_RELEASE_TABLET | Freq: Every day | ORAL | Status: DC
  Administered 2016-11-25 – 2016-11-26 (×2): 81 mg via ORAL
  Filled 2016-11-25 (×2): qty 1

## 2016-11-25 MED ORDER — PANTOPRAZOLE SODIUM 40 MG PO TBEC
40.0000 mg | DELAYED_RELEASE_TABLET | Freq: Every day | ORAL | Status: DC
Start: 2016-11-25 — End: 2016-11-26
  Administered 2016-11-25 – 2016-11-26 (×2): 40 mg via ORAL
  Filled 2016-11-25 (×2): qty 1

## 2016-11-25 MED ORDER — DARIFENACIN HYDROBROMIDE ER 7.5 MG PO TB24
7.5000 mg | ORAL_TABLET | Freq: Every day | ORAL | Status: DC
  Administered 2016-11-25 – 2016-11-26 (×2): 7.5 mg via ORAL
  Filled 2016-11-25 (×2): qty 1

## 2016-11-25 MED ORDER — BICTEGRAVIR-EMTRICITAB-TENOFOV 50-200-25 MG PO TABS
1.0000 | ORAL_TABLET | Freq: Every day | ORAL | Status: DC
  Administered 2016-11-25 – 2016-11-26 (×2): 1 via ORAL
  Filled 2016-11-25 (×2): qty 1

## 2016-11-25 MED ORDER — TAMSULOSIN HCL 0.4 MG PO CAPS
0.4000 mg | ORAL_CAPSULE | Freq: Every day | ORAL | Status: DC
Start: 2016-11-25 — End: 2016-11-25

## 2016-11-25 MED ORDER — SODIUM CHLORIDE 0.9 % IV BOLUS (SEPSIS)
1000.0000 mL | Freq: Once | INTRAVENOUS | Status: AC
  Administered 2016-11-25: 1000 mL via INTRAVENOUS

## 2016-11-25 MED ORDER — ACYCLOVIR 400 MG PO TABS
400.0000 mg | ORAL_TABLET | Freq: Two times a day (BID) | ORAL | Status: DC
  Filled 2016-11-25 (×2): qty 1

## 2016-11-25 MED ORDER — HYDROMORPHONE HCL 1 MG/ML IJ SOLN
0.5000 mg | Freq: Once | INTRAMUSCULAR | Status: AC
  Administered 2016-11-25: 0.5 mg via INTRAVENOUS
  Filled 2016-11-25: qty 1

## 2016-11-25 MED ORDER — IOPAMIDOL (ISOVUE-300) INJECTION 61%
30.0000 mL | Freq: Once | INTRAVENOUS | Status: AC | PRN
  Administered 2016-11-25: 30 mL via ORAL

## 2016-11-25 MED ORDER — PRAVASTATIN SODIUM 20 MG PO TABS
80.0000 mg | ORAL_TABLET | Freq: Every day | ORAL | Status: DC
  Administered 2016-11-25: 80 mg via ORAL
  Filled 2016-11-25: qty 4

## 2016-11-25 MED ORDER — MORPHINE SULFATE (PF) 4 MG/ML IV SOLN
4.0000 mg | Freq: Once | INTRAVENOUS | Status: AC
  Administered 2016-11-25: 4 mg via INTRAVENOUS
  Filled 2016-11-25: qty 1

## 2016-11-25 MED ORDER — CHLORTHALIDONE 25 MG PO TABS
25.0000 mg | ORAL_TABLET | Freq: Every day | ORAL | Status: DC
  Administered 2016-11-25 – 2016-11-26 (×2): 25 mg via ORAL
  Filled 2016-11-25 (×2): qty 1

## 2016-11-25 MED ORDER — HYDRALAZINE HCL 20 MG/ML IJ SOLN
INTRAMUSCULAR | Status: AC
  Filled 2016-11-25: qty 1

## 2016-11-25 MED ORDER — INSULIN GLARGINE 100 UNIT/ML ~~LOC~~ SOLN
50.0000 [IU] | Freq: Every day | SUBCUTANEOUS | Status: DC
  Administered 2016-11-25: 50 [IU] via SUBCUTANEOUS
  Filled 2016-11-25 (×3): qty 0.5

## 2016-11-25 MED ORDER — FLUTICASONE PROPIONATE 50 MCG/ACT NA SUSP
2.0000 | Freq: Every day | NASAL | Status: DC
  Administered 2016-11-26: 2 via NASAL
  Filled 2016-11-25: qty 16

## 2016-11-25 MED ORDER — LORATADINE 10 MG PO TABS
10.0000 mg | ORAL_TABLET | Freq: Every day | ORAL | Status: DC
  Administered 2016-11-25 – 2016-11-26 (×2): 10 mg via ORAL
  Filled 2016-11-25 (×2): qty 1

## 2016-11-25 MED ORDER — PREDNISOLONE ACETATE 1 % OP SUSP
1.0000 [drp] | Freq: Every day | OPHTHALMIC | Status: DC
  Administered 2016-11-26: 1 [drp] via OPHTHALMIC
  Filled 2016-11-25: qty 1

## 2016-11-25 MED ORDER — IOPAMIDOL (ISOVUE-300) INJECTION 61%
100.0000 mL | Freq: Once | INTRAVENOUS | Status: AC | PRN
  Administered 2016-11-25: 100 mL via INTRAVENOUS

## 2016-11-25 MED ORDER — NYSTATIN-TRIAMCINOLONE 100000-0.1 UNIT/GM-% EX CREA
1.0000 "application " | TOPICAL_CREAM | Freq: Two times a day (BID) | CUTANEOUS | Status: DC
  Administered 2016-11-25 – 2016-11-26 (×2): 1 via TOPICAL
  Filled 2016-11-25: qty 15

## 2016-11-25 MED ORDER — NORTRIPTYLINE HCL 25 MG PO CAPS
50.0000 mg | ORAL_CAPSULE | Freq: Every day | ORAL | Status: DC
  Administered 2016-11-25: 50 mg via ORAL
  Filled 2016-11-25 (×2): qty 2

## 2016-11-25 MED ORDER — HEPARIN SODIUM (PORCINE) 5000 UNIT/ML IJ SOLN
5000.0000 [IU] | Freq: Three times a day (TID) | INTRAMUSCULAR | Status: DC
  Administered 2016-11-25 – 2016-11-26 (×3): 5000 [IU] via SUBCUTANEOUS
  Filled 2016-11-25 (×3): qty 1

## 2016-11-25 MED ORDER — HYDRALAZINE HCL 20 MG/ML IJ SOLN
10.0000 mg | Freq: Four times a day (QID) | INTRAMUSCULAR | Status: DC | PRN
  Administered 2016-11-25: 10 mg via INTRAVENOUS

## 2016-11-25 MED ORDER — INSULIN ASPART 100 UNIT/ML ~~LOC~~ SOLN
0.0000 [IU] | Freq: Three times a day (TID) | SUBCUTANEOUS | Status: DC
  Administered 2016-11-25: 0.3 [IU] via SUBCUTANEOUS
  Administered 2016-11-26: 3 [IU] via SUBCUTANEOUS
  Administered 2016-11-26: 7 [IU] via SUBCUTANEOUS
  Filled 2016-11-25 (×3): qty 1

## 2016-11-25 MED ORDER — ZOLPIDEM TARTRATE 5 MG PO TABS
10.0000 mg | ORAL_TABLET | Freq: Every day | ORAL | Status: DC
  Administered 2016-11-25: 10 mg via ORAL
  Filled 2016-11-25: qty 2

## 2016-11-25 MED ORDER — TIZANIDINE HCL 2 MG PO TABS
4.0000 mg | ORAL_TABLET | Freq: Three times a day (TID) | ORAL | Status: DC | PRN
  Filled 2016-11-25: qty 2

## 2016-11-25 MED ORDER — LISINOPRIL 20 MG PO TABS
40.0000 mg | ORAL_TABLET | Freq: Every day | ORAL | Status: DC
  Administered 2016-11-25 – 2016-11-26 (×2): 40 mg via ORAL
  Filled 2016-11-25: qty 4
  Filled 2016-11-25: qty 2

## 2016-11-25 MED ORDER — HYDROMORPHONE HCL 2 MG PO TABS
4.0000 mg | ORAL_TABLET | ORAL | Status: DC | PRN
  Administered 2016-11-25 – 2016-11-26 (×4): 4 mg via ORAL
  Filled 2016-11-25 (×4): qty 2

## 2016-11-25 MED ORDER — CLONIDINE HCL 0.1 MG/24HR TD PTWK
0.1000 mg | MEDICATED_PATCH | TRANSDERMAL | Status: DC
  Administered 2016-11-26: 0.1 mg via TRANSDERMAL
  Filled 2016-11-25: qty 1

## 2016-11-25 MED ORDER — CITALOPRAM HYDROBROMIDE 20 MG PO TABS
20.0000 mg | ORAL_TABLET | Freq: Every day | ORAL | Status: DC
  Administered 2016-11-25 – 2016-11-26 (×2): 20 mg via ORAL
  Filled 2016-11-25 (×2): qty 1

## 2016-11-25 MED ORDER — ENSURE ENLIVE PO LIQD
237.0000 mL | Freq: Two times a day (BID) | ORAL | Status: DC
  Administered 2016-11-26: 237 mL via ORAL

## 2016-11-25 MED ORDER — METOPROLOL TARTRATE 50 MG PO TABS
100.0000 mg | ORAL_TABLET | Freq: Two times a day (BID) | ORAL | Status: DC
  Administered 2016-11-25 – 2016-11-26 (×3): 100 mg via ORAL
  Filled 2016-11-25 (×3): qty 2

## 2016-11-25 MED ORDER — ALFUZOSIN HCL ER 10 MG PO TB24
10.0000 mg | ORAL_TABLET | Freq: Every day | ORAL | Status: DC
  Administered 2016-11-26: 10 mg via ORAL
  Filled 2016-11-25 (×2): qty 1

## 2016-11-25 MED ORDER — AMLODIPINE BESYLATE 10 MG PO TABS
10.0000 mg | ORAL_TABLET | Freq: Every day | ORAL | Status: DC
  Administered 2016-11-25 – 2016-11-26 (×2): 10 mg via ORAL
  Filled 2016-11-25: qty 2
  Filled 2016-11-25: qty 1

## 2016-11-25 MED ORDER — BUPROPION HCL ER (XL) 150 MG PO TB24
150.0000 mg | ORAL_TABLET | Freq: Every day | ORAL | Status: DC
  Administered 2016-11-25 – 2016-11-26 (×2): 150 mg via ORAL
  Filled 2016-11-25 (×2): qty 1

## 2016-11-25 MED ORDER — HYDROMORPHONE HCL 1 MG/ML IJ SOLN
1.0000 mg | Freq: Once | INTRAMUSCULAR | Status: AC
  Administered 2016-11-25: 1 mg via INTRAVENOUS
  Filled 2016-11-25: qty 1

## 2016-11-25 NOTE — ED Notes (Signed)
Pt emesis x1 s/p PO contrast. CT aware. TO come get pt when ready to obtain CT.

## 2016-11-25 NOTE — ED Provider Notes (Signed)
[facility] Emergency Department Provider Note ____________________________________________   First MD Initiated Contact with Patient 11/25/16 [phone]     (approximate)  I have reviewed the triage vital signs and the nursing notes.   HISTORY  Chief Complaint Abdominal Pain    HPI NICHOALS HEYDE is a 36 y.o. male With past medical history as noted below who presents with left lower quadrant abdominal pain acute onset 6 hours ago, associated with vomiting, nonbloody and nonbilious, and associated with increased loose output from his colostomy. Patient states that he has similar flares of pain in the past, most recently 1 week ago when he was seen at [facility].  Patient states at time he was constipated, however.  He denies fever or chills, urinary symptoms, chest pain, or difficulty breathing.  Past Medical History:  Diagnosis Date  . Abrasion or friction burn of foot and toe(s), without mention of infection   . Acid reflux   . Adopted   . Allergic rhinitis, cause unspecified   . Anxiety   . Arthritis   . Arthrogryposis   . Asthma   . Bladder wall thickening   . BPH (benign prostatic hyperplasia)   . Chest pain, unspecified   . Chronic pain syndrome   . Depressive disorder, not elsewhere classified   . Diabetes mellitus without complication (HCC)   . Diverticulitis of colon (without mention of hemorrhage)(562.11)   . ED (erectile dysfunction)   . Herpes genitalis   . HIV infection (HCC)   . HTN (hypertension)   . Hyperlipidemia   . Hypogonadism in male   . Myalgia and myositis, unspecified   . OAB (overactive bladder)   . Obesity   . Other and unspecified noninfectious gastroenteritis and colitis(558.9)   . Other convulsions   . Other psoriasis   . 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 Premature baby    born premature  . Sleep apnea   . 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 or other cause, without mention of thyrotoxic crisis or  storm    hyperthyroidism  . TIA (transient ischemic attack)   . Type II or unspecified type diabetes mellitus with neurological manifestations, not stated as uncontrolled(250.60)     Patient Active Problem List   Diagnosis Date Noted  . Hypotension 10/08/2016  . Unstable angina (HCC) 10/06/2016  . Shortness of breath 06/23/2015  . Obesity 05/18/2014  . Frequent falls 08/12/2013  . Pain of left clavicle 08/12/2013  . Tachycardia 01/21/2013  . Obstructive sleep apnea on CPAP 06/07/2012  . Chest pain 06/20/2011  . Hypertension 06/20/2011  . Diabetes mellitus (HCC) 06/20/2011    Past Surgical History:  Procedure Laterality Date  . APPENDECTOMY    . COLOSTOMY    . CORNEAL TRANSPLANT    . feet surgery     both  . HAND SURGERY     left and right  . leg surgery     left and right  . ORTHOPEDIC SURGERY     hands, feet, knees, legs  . tubes in ears     both    Prior to Admission medications   Medication Sig Start Date End Date Taking? Authorizing Provider  acyclovir (ZOVIRAX) 400 MG tablet Take 400 mg by mouth 2 (two) times daily.    [provider]  albuterol (PROVENTIL HFA;VENTOLIN HFA) 108 (90 BASE) MCG/ACT inhaler Inhale 2 puffs into the lungs every 4 (four) hours as needed for wheezing or shortness of breath.     [provider]  albuterol (PROVENTIL) (2.5 MG/3ML) 0.083% nebulizer solution Take 2.5 mg by nebulization every 4 (four) hours as needed for wheezing or shortness of breath.    [provider]  alfuzosin (UROXATRAL) 10 MG 24 hr tablet Take 10 mg by mouth daily.    [provider]  amLODipine (NORVASC) 10 MG tablet Take 10 mg by mouth daily.    [provider]  aspirin 81 MG tablet Take 81 mg by mouth daily.    [provider]  baclofen (LIORESAL) 10 MG tablet Take 10 mg by mouth 3 (three) times daily.    [provider]  beclomethasone (QVAR) 80 MCG/ACT inhaler Inhale 2 puffs into the lungs 2 (two) times  daily.     [provider]  Buprenorphine (BUTRANS) 15 MCG/HR PTWK Place 15 mg onto the skin once a week. Saturday    [provider]  citalopram (CELEXA) 20 MG tablet Take 20 mg by mouth daily.    [provider]  cloNIDine (CATAPRES - DOSED IN MG/24 HR) 0.1 mg/24hr patch Place 0.1 mg onto the skin once a week.    [provider]  diclofenac sodium (VOLTAREN) 1 % GEL Apply 2-4 g topically 4 (four) times daily as needed. For pain.    [provider]  dicyclomine (BENTYL) 20 MG tablet Take 20 mg by mouth every 6 (six) hours.    [provider]  DOCOSAHEXAENOIC ACID PO Take 1 tablet by mouth daily.     [provider]  famotidine (PEPCID) 20 MG tablet Take 20 mg by mouth 2 (two) times daily.  01/03/16   [provider]  feeding supplement, ENSURE ENLIVE, (ENSURE ENLIVE) LIQD Take 237 mLs by mouth 2 (two) times daily between meals. 10/10/16   Altamese Dilling, MD  finasteride (PROSCAR) 5 MG tablet Take 5 mg by mouth daily.    [provider]  fluticasone (FLONASE) 50 MCG/ACT nasal spray Place 2 sprays into the nose daily.    [provider]  hydrocortisone cream 0.5 % Apply 1 application topically 2 (two) times daily.    [provider]  HYDROmorphone (DILAUDID) 4 MG tablet Take 1 tablet (4 mg total) by mouth every 3 (three) hours as needed. Patient taking differently: Take 4 mg by mouth 2 (two) times daily as needed.  02/26/16   Willy Eddy, MD  ibuprofen (ADVIL,MOTRIN) 400 MG tablet Take 400 mg by mouth every 6 (six) hours as needed.    [provider]  Insulin Degludec (TRESIBA FLEXTOUCH) 200 UNIT/ML SOPN Inject 100 Units into the skin daily at 10 pm.    [provider]  ketoconazole (NIZORAL) 2 % shampoo Apply 1 application topically 2 (two) times a week. tues and thursday 08/27/14   [provider]  lisinopril (PRINIVIL,ZESTRIL) 40 MG tablet Take 40 mg by mouth  daily.    [provider]  loratadine (CLARITIN) 10 MG tablet Take 10 mg by mouth daily.    [provider]  metoprolol tartrate (LOPRESSOR) 100 MG tablet Take 100 mg by mouth 2 (two) times daily.    [provider]  mirtazapine (REMERON) 45 MG tablet Take 45 mg by mouth at bedtime.    [provider]  Multiple Vitamin (MULTIVITAMIN) tablet Take 1 tablet by mouth daily.    [provider]  naloxone [facility]) nasal spray 4 mg/0.1 mL One spray in either nostril once for known/suspected opioid overdose. May repeat every 2-3 minutes in alternating nostril til  EMS arrives 01/12/16   [provider]  nitroGLYCERIN (NITROSTAT) 0.4 MG SL tablet Place 1 tablet (0.4 mg total) under the tongue every 5 (five) minutes as needed for chest pain. Call 911 if chest is not resolved w/ 3 tabs. 11/02/14   Antonieta IbaGollan, Timothy J, MD  nortriptyline (PAMELOR) 25 MG capsule Take 50 mg by mouth at bedtime.    [provider]  NOVOLOG FLEXPEN 100 UNIT/ML FlexPen Inject 10 Units into the skin See admin instructions. With every meal and every snack 12/13/15   [provider]  nystatin (MYCOSTATIN) powder Apply topically 3 (three) times daily. For 10 days 05/19/15   Renford DillsMiller, Lindsey, NP  nystatin-triamcinolone [facility](MYCOLOG II) cream Apply 1 application topically 2 (two) times daily.    [provider]  omeprazole (PRILOSEC) 20 MG capsule Take 20 mg by mouth daily.    [provider]  ondansetron (ZOFRAN-ODT) 4 MG disintegrating tablet Take 4 mg by mouth every 8 (eight) hours as needed for nausea or vomiting.    [provider]  polyethylene glycol (MIRALAX / GLYCOLAX) packet Take 17 g by mouth 3 (three) times daily.     [provider]  potassium chloride SA (K-DUR,KLOR-CON) 20 MEQ tablet take 1 tablet by mouth once daily if needed Patient taking differently: take 1 tablet by mouth once daily 02/08/16   Antonieta IbaGollan, Timothy J, MD  pravastatin  (PRAVACHOL) 80 MG tablet take 1 tablet by mouth at bedtime 10/10/16   Antonieta IbaGollan, Timothy J, MD  prednisoLONE acetate (PRED FORTE) 1 % ophthalmic suspension Place 1 drop into the right eye daily.     [provider]  RA MELATONIN 1 MG SUBL Take 1 mg by mouth at bedtime.  12/27/15   [provider]  solifenacin (VESICARE) 10 MG tablet Take 10 mg by mouth daily.    [provider]  tamsulosin (FLOMAX) 0.4 MG CAPS Take 0.4 mg by mouth daily.    [provider]  tiZANidine (ZANAFLEX) 4 MG capsule Take 4 mg by mouth 3 (three) times daily as needed.  01/12/16 01/11/17  [provider]  zolpidem (AMBIEN) 10 MG tablet Take 10 mg by mouth at bedtime.    [provider]    Allergies Penicillins; Erythromycin base; and Erythromycin base  Family History  Problem Relation Age of Onset  . Family history unknown: Yes    Social History Social History  Substance Use Topics  . Smoking status: Current Every Day Smoker    Years: 0.00  . Smokeless tobacco: Never Used     Comment: quit smoking friday 08/18/2016  . Alcohol use No     Comment: used to be moderate    Review of Systems  Constitutional: No fever/chills Eyes: No redness. ENT: No sore throat. Cardiovascular: Denies chest pain. Respiratory: Denies shortness of breath. Gastrointestinal: Positive for nausea and vomiting.  Genitourinary: Negative for dysuria.  Musculoskeletal: Negative for back pain. Skin: Negative for rash. Neurological: Negative for headache.   ____________________________________________   PHYSICAL EXAM:  VITAL SIGNS: ED Triage Vitals  Enc Vitals Group     BP 11/25/16 0648 (!) 140/99     Pulse Rate 11/25/16 0648 (!) 116     Resp 11/25/16 0648 20     Temp 11/25/16 0648 98.2 F (36.8 C)     Temp Source 11/25/16 0648 Oral     SpO2 11/25/16 0648 94 %     Weight 11/25/16 0650 182 lb (82.6 kg)     Height 11/25/16  0650 5\' 5"  (1.651 m)     Head Circumference --       Peak Flow --      Pain Score 11/25/16 0647 10     Pain Loc --      Pain Edu? --      Excl. in GC? --     Constitutional: Alert and oriented. Uncomfortable appearing.  Eyes: Conjunctivae are normal.  Head: Atraumatic. Nose: No congestion/rhinnorhea. Mouth/Throat: Mucous membranes are moist.   Neck: Normal range of motion.  Cardiovascular: Tachycardic, regular rhythm. Grossly normal heart sounds.  Good peripheral circulation. Respiratory: Normal respiratory effort.  No retractions. Lungs CTAB. Gastrointestinal: Soft with moderate LLQ and L mid abd tenderness.  Genitourinary: No CVA tenderness. Musculoskeletal: No lower extremity edema.  Extremities warm and well perfused.  Neurologic:  Normal speech and language. No gross focal neurologic deficits are appreciated.  Skin:  Skin is warm and dry. No rash noted. Psychiatric: Mood and affect are normal. Speech and behavior are normal.  ____________________________________________   LABS (all labs ordered are listed, but only abnormal results are displayed)  Labs Reviewed  COMPREHENSIVE METABOLIC PANEL - Abnormal; Notable for the following:       Result Value   Chloride 95 (*)    Glucose, Bld 261 (*)    All other components within normal limits  URINALYSIS, COMPLETE (UACMP) WITH MICROSCOPIC - Abnormal; Notable for the following:    Color, Urine YELLOW (*)    APPearance HAZY (*)    Glucose, UA >=500 (*)    Ketones, ur 20 (*)    All other components within normal limits  LIPASE, BLOOD  CBC   ____________________________________________  EKG   ____________________________________________  RADIOLOGY  CT abd: no acute findings  ____________________________________________   PROCEDURES  Procedure(s) performed: No    Critical Care performed: No ____________________________________________   INITIAL IMPRESSION / ASSESSMENT AND PLAN / ED COURSE  Pertinent labs & imaging results that were available during my care of  the patient were reviewed by me and considered in my medical decision making (see chart for details).  36 year old male with a past history of OSA, CAD, insulin requiring DM, hypertension, colostomy, recent dx of HIV and multiple other medical problems p/w recurrent LLQ abd pain associated with nausea and vomiting.    On review of past medical records in epic and care everywhere, patient had a visit to the [facility] ED 1 week ago for similar symptoms and had a negative CT at that time.    On exam, patient is somewhat uncomfortable appearing, tachycardic but with otherwise normal vital signs, and is moderately tender in the left lower quadrant and left mid abdomen.  Differential includes recurrence of chronic pain, but also acute colitis, diverticulitis, gastroenteritis, less likely SBO.  Although pt has had imaging recently, given acute pain and significant tenderness on exam as well as abnormal VS, will obtain labs and CT.      ----------------------------------------- 10:24 AM on 11/25/2016 -----------------------------------------  CT shows no acute findings, lab workup is unremarkable. UA is also negative. Patient remains with improved but significant pain, and remains tachycardic after fluids. Given the abnormal vital signs and persistent pain and since opiates at home have not been controlling this exacerbation, we will admit for pain control and fluids.  ____________________________________________   FINAL CLINICAL IMPRESSION(S) / ED DIAGNOSES  Final diagnoses:  Left lower quadrant pain      NEW MEDICATIONS STARTED DURING THIS VISIT:  New Prescriptions   No medications on  file     Note:  This document was prepared using Dragon voice recognition software and may include unintentional dictation errors.    Dionne Bucy, MD 11/25/16 1025

## 2016-11-25 NOTE — ED Notes (Signed)
Admitting Provider at bedside. 

## 2016-11-25 NOTE — ED Notes (Signed)
Pharmacy tech at bedside 

## 2016-11-25 NOTE — ED Notes (Signed)
Patient transported to CT 

## 2016-11-25 NOTE — H&P (Addendum)
[facility] [place] [facility]   PATIENT NAME: Cory Campbell    MR#:  [id number]  DATE OF BIRTH:  Aug 01, 1980  DATE OF ADMISSION:  11/25/2016  PRIMARY CARE PHYSICIAN: Emogene Morgan, MD   REQUESTING/REFERRING PHYSICIAN: [email or web address]  CHIEF COMPLAINT:   Chief Complaint  Patient presents with  . Abdominal Pain    HISTORY OF PRESENT ILLNESS: Cory Campbell  is a 36 y.o. male with a known history of Htn, HLD, HIV, COngenital muscular disorder, DM, Chronic pain syndrome, Colostomy and multiple other issues. Have multiple visits to here and [facility] ER, follows at [facility] pain clinic and GI surgery clinic and [facility] with Dr. Sampson Goon for HIV.  Told by [facility] sx yesterday that- he need a reversal of his colostomy and the scar tissue to help him with pain, but they will do it once his " Counts are good, due to his HIV" ( Started on ART last week.) Came to ER as since last night, have abdominal pian and vomiting, could not keep any of his meds in, and his BP and HR is high. No fever/ chills.  PAST MEDICAL HISTORY:   Past Medical History:  Diagnosis Date  . Abrasion or friction burn of foot and toe(s), without mention of infection   . Acid reflux   . Adopted   . Allergic rhinitis, cause unspecified   . Anxiety   . Arthritis   . Arthrogryposis   . Asthma   . Bladder wall thickening   . BPH (benign prostatic hyperplasia)   . Chest pain, unspecified   . Chronic pain syndrome   . Depressive disorder, not elsewhere classified   . Diabetes mellitus without complication (HCC)   . Diverticulitis of colon (without mention of hemorrhage)(562.11)   . ED (erectile dysfunction)   . Herpes genitalis   . HIV infection (HCC)   . HTN (hypertension)   . Hyperlipidemia   . Hypogonadism in male   . Myalgia and myositis, unspecified   . OAB (overactive bladder)   . Obesity   . Other and unspecified noninfectious gastroenteritis and colitis(558.9)   . Other  convulsions   . Other psoriasis   . 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 Premature baby    born premature  . Sleep apnea   . 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 or other cause, without mention of thyrotoxic crisis or storm    hyperthyroidism  . TIA (transient ischemic attack)   . Type II or unspecified type diabetes mellitus with neurological manifestations, not stated as uncontrolled(250.60)     PAST SURGICAL HISTORY: Past Surgical History:  Procedure Laterality Date  . APPENDECTOMY    . COLOSTOMY    . CORNEAL TRANSPLANT    . feet surgery     both  . HAND SURGERY     left and right  . leg surgery     left and right  . ORTHOPEDIC SURGERY     hands, feet, knees, legs  . tubes in ears     both    SOCIAL HISTORY:  Social History  Substance Use Topics  . Smoking status: Current Every Day Smoker    Years: 0.00  . Smokeless tobacco: Never Used     Comment: quit smoking friday 08/18/2016  . Alcohol use No     Comment: used to be moderate    FAMILY HISTORY:  Family History  Problem Relation Age of Onset  . Family history unknown: Yes  DRUG ALLERGIES:  Allergies  Allergen Reactions  . Penicillins Itching, Rash and Hives    Has patient had a PCN reaction causing immediate rash, facial/tongue/throat swelling, SOB or lightheadedness with hypotension: Yes Has patient had a PCN reaction causing severe rash involving mucus membranes or skin necrosis: No Has patient had a PCN reaction that required hospitalization No Has patient had a PCN reaction occurring within the last 10 years: No If all of the above answers are "NO", then may proceed with Cephalosporin use. Has patient had a PCN reaction causing immediate rash, facial/tongue/throat swelling, SOB or lightheadedness with hypotension: Yes Has patient had a PCN reaction causing severe rash involving mucus membranes or skin necrosis: No Has patient had a PCN reaction that required hospitalization  No Has patient had a PCN reaction occurring within the last 10 years: No If all of the above answers are "NO", then may proceed with Cephalosporin use.  . Erythromycin Base Itching and Rash  . Erythromycin Base Itching and Rash    REVIEW OF SYSTEMS:   CONSTITUTIONAL: No fever, fatigue or weakness.  EYES: No blurred or double vision.  EARS, NOSE, AND THROAT: No tinnitus or ear pain.  RESPIRATORY: No cough, shortness of breath, wheezing or hemoptysis.  CARDIOVASCULAR: No chest pain, orthopnea, edema.  GASTROINTESTINAL: positive for nausea, vomiting, diarrhea and abdominal pain.  GENITOURINARY: No dysuria, hematuria.  ENDOCRINE: No polyuria, nocturia,  HEMATOLOGY: No anemia, easy bruising or bleeding SKIN: No rash or lesion. MUSCULOSKELETAL: No joint pain or arthritis.   NEUROLOGIC: No tingling, numbness, weakness.  PSYCHIATRY: No anxiety or depression.   MEDICATIONS AT HOME:  Prior to Admission medications   Medication Sig Start Date End Date Taking? Authorizing Provider  acyclovir (ZOVIRAX) 400 MG tablet Take 400 mg by mouth 2 (two) times daily.   Yes [provider]  albuterol (PROVENTIL HFA;VENTOLIN HFA) 108 (90 BASE) MCG/ACT inhaler Inhale 2 puffs into the lungs every 4 (four) hours as needed for wheezing or shortness of breath.    Yes [provider]  albuterol (PROVENTIL) (2.5 MG/3ML) 0.083% nebulizer solution Take 2.5 mg by nebulization every 4 (four) hours as needed for wheezing or shortness of breath.   Yes [provider]  alfuzosin (UROXATRAL) 10 MG 24 hr tablet Take 10 mg by mouth daily.   Yes [provider]  amLODipine (NORVASC) 10 MG tablet Take 10 mg by mouth daily.   Yes [provider]  aspirin 81 MG tablet Take 81 mg by mouth daily.   Yes [provider]  BIKTARVY 50-200-25 MG TABS tablet Take 1 tablet by mouth daily. 11/21/16  Yes [provider]  Buprenorphine (BUTRANS) 15 MCG/HR PTWK Place 15 mg onto  the skin once a week. Saturday   Yes [provider]  buPROPion (WELLBUTRIN XL) 150 MG 24 hr tablet Take 1 tablet by mouth daily. 11/16/16  Yes [provider]  chlorthalidone (HYGROTON) 25 MG tablet Take 1 tablet by mouth daily. 11/15/16  Yes [provider]  citalopram (CELEXA) 20 MG tablet Take 20 mg by mouth daily.   Yes [provider]  diclofenac sodium (VOLTAREN) 1 % GEL Apply 2-4 g topically 4 (four) times daily as needed. For pain.   Yes [provider]  dicyclomine (BENTYL) 20 MG tablet Take 20 mg by mouth every 6 (six) hours.   Yes [provider]  DOCOSAHEXAENOIC ACID PO Take 1 tablet by mouth daily.    Yes [provider]  famotidine (PEPCID) 20  MG tablet Take 20 mg by mouth 2 (two) times daily.  01/03/16  Yes [provider]  feeding supplement, ENSURE ENLIVE, (ENSURE ENLIVE) LIQD Take 237 mLs by mouth 2 (two) times daily between meals. 10/10/16  Yes Altamese Dilling, MD  finasteride (PROSCAR) 5 MG tablet Take 5 mg by mouth daily.   Yes [provider]  fluticasone (FLONASE) 50 MCG/ACT nasal spray Place 2 sprays into the nose daily.   Yes [provider]  hydrocortisone cream 0.5 % Apply 1 application topically 2 (two) times daily.   Yes [provider]  HYDROmorphone (DILAUDID) 4 MG tablet Take 1 tablet (4 mg total) by mouth every 3 (three) hours as needed. Patient taking differently: Take 4 mg by mouth 2 (two) times daily as needed.  02/26/16  Yes Willy Eddy, MD  Insulin Degludec (TRESIBA FLEXTOUCH) 200 UNIT/ML SOPN Inject 100 Units into the skin daily at 10 pm.   Yes [provider]  ketoconazole (NIZORAL) 2 % shampoo Apply 1 application topically 2 (two) times a week. tues and thursday 08/27/14  Yes [provider]  lisinopril (PRINIVIL,ZESTRIL) 40 MG tablet Take 40 mg by mouth daily.   Yes [provider]  loratadine (CLARITIN) 10 MG tablet Take 10  mg by mouth daily.   Yes [provider]  metoprolol tartrate (LOPRESSOR) 100 MG tablet Take 100 mg by mouth 2 (two) times daily.   Yes [provider]  mirtazapine (REMERON) 45 MG tablet Take 45 mg by mouth at bedtime.   Yes [provider]  Multiple Vitamin (MULTIVITAMIN) tablet Take 1 tablet by mouth daily.   Yes [provider]  nortriptyline (PAMELOR) 25 MG capsule Take 50 mg by mouth at bedtime.   Yes [provider]  NOVOLOG FLEXPEN 100 UNIT/ML FlexPen Inject 10 Units into the skin 3 (three) times daily with meals. With every meal and every snack 12/13/15  Yes [provider]  nystatin-triamcinolone (MYCOLOG II) cream Apply 1 application topically 2 (two) times daily.   Yes [provider]  omeprazole (PRILOSEC) 20 MG capsule Take 20 mg by mouth daily.   Yes [provider]  polyethylene glycol (MIRALAX / GLYCOLAX) packet Take 17 g by mouth 3 (three) times daily.    Yes [provider]  potassium chloride SA (K-DUR,KLOR-CON) 20 MEQ tablet take 1 tablet by mouth once daily if needed Patient taking differently: take 1 tablet by mouth once daily 02/08/16  Yes Gollan, Tollie Pizza, MD  pravastatin (PRAVACHOL) 80 MG tablet take 1 tablet by mouth at bedtime 10/10/16  Yes Gollan, Tollie Pizza, MD  prednisoLONE acetate (PRED FORTE) 1 % ophthalmic suspension Place 1 drop into the right eye daily.    Yes [provider]  RA MELATONIN 1 MG SUBL Take 1 mg by mouth at bedtime.  12/27/15  Yes [provider]  solifenacin (VESICARE) 10 MG tablet Take 10 mg by mouth daily.   Yes [provider]  tamsulosin (FLOMAX) 0.4 MG CAPS Take 0.4 mg by mouth daily.   Yes [provider]  tiZANidine (ZANAFLEX) 4 MG capsule Take 4 mg by mouth 3 (three) times daily as needed.  01/12/16 01/11/17 Yes [provider]  zolpidem (AMBIEN) 10 MG tablet Take 10 mg by mouth at bedtime.   Yes [provider]   cloNIDine (CATAPRES - DOSED IN MG/24 HR) 0.1 mg/24hr patch Place 0.1 mg onto the skin once a week.    [provider]  naloxone Jonelle Sports)  nasal spray 4 mg/0.1 mL One spray in either nostril once for known/suspected opioid overdose. May repeat every 2-3 minutes in alternating nostril til EMS arrives 01/12/16   [provider]  nitroGLYCERIN (NITROSTAT) 0.4 MG SL tablet Place 1 tablet (0.4 mg total) under the tongue every 5 (five) minutes as needed for chest pain. Call 911 if chest is not resolved w/ 3 tabs. 11/02/14   Antonieta Iba, MD  nystatin (MYCOSTATIN) powder Apply topically 3 (three) times daily. For 10 days Patient not taking: Reported on 11/25/2016 05/19/15   Renford Dills, NP  ondansetron (ZOFRAN-ODT) 4 MG disintegrating tablet Take 4 mg by mouth every 8 (eight) hours as needed for nausea or vomiting.    [provider]      PHYSICAL EXAMINATION:   VITAL SIGNS: Blood pressure (!) 144/103, pulse (!) 116, temperature 98.2 F (36.8 C), temperature source Oral, resp. rate 20, height 5\' 5"  (1.651 m), weight 82.6 kg (182 lb), SpO2 94 %.  GENERAL:  36 y.o.-year-old patient lying in the bed with no acute distress.  EYES: Pupils equal, round, reactive to light and accommodation. No scleral icterus. Extraocular muscles intact.  HEENT: Head atraumatic, normocephalic. Oropharynx and nasopharynx clear.  NECK:  Supple, no jugular venous distention. No thyroid enlargement, no tenderness.  LUNGS: Normal breath sounds bilaterally, no wheezing, rales,rhonchi or crepitation. No use of accessory muscles of respiration.  CARDIOVASCULAR: S1, S2 normal. No murmurs, rubs, or gallops.  ABDOMEN: Soft, mild tender, nondistended. Bowel sounds present. No organomegaly or mass. Colostomy in place. EXTREMITIES: No pedal edema, cyanosis, or clubbing. Have congenital deformities on all 4 limbs, atrophic. NEUROLOGIC: Cranial nerves II through XII are intact. Muscle strength 4/5 in upper  extremities. Lower extremities are atrophic and stump like. Sensation intact. Gait not checked.  PSYCHIATRIC: The patient is alert and oriented x 3.  SKIN: No obvious rash, lesion, or ulcer.   LABORATORY PANEL:   CBC  Recent Labs Lab 11/25/16 0703  WBC 7.6  HGB 14.2  HCT 43.7  PLT 276  MCV 84.1  MCH 27.4  MCHC 32.6  RDW 13.5   ------------------------------------------------------------------------------------------------------------------  Chemistries   Recent Labs Lab 11/25/16 0703  NA 135  K 3.6  CL 95*  CO2 27  GLUCOSE 261*  BUN 19  CREATININE 0.79  CALCIUM 9.3  AST 29  ALT 21  ALKPHOS 58  BILITOT 0.4   ------------------------------------------------------------------------------------------------------------------ estimated creatinine clearance is 125 mL/min (by C-G formula based on SCr of 0.79 mg/dL). ------------------------------------------------------------------------------------------------------------------ No results for input(s): TSH, T4TOTAL, T3FREE, THYROIDAB in the last 72 hours.  Invalid input(s): FREET3   Coagulation profile No results for input(s): INR, PROTIME in the last 168 hours. ------------------------------------------------------------------------------------------------------------------- No results for input(s): DDIMER in the last 72 hours. -------------------------------------------------------------------------------------------------------------------  Cardiac Enzymes No results for input(s): CKMB, TROPONINI, MYOGLOBIN in the last 168 hours.  Invalid input(s): CK ------------------------------------------------------------------------------------------------------------------ Invalid input(s): POCBNP  ---------------------------------------------------------------------------------------------------------------  Urinalysis    Component Value Date/Time   COLORURINE YELLOW (A) 11/25/2016 0703   APPEARANCEUR HAZY (A)  11/25/2016 0703   APPEARANCEUR Cloudy 05/16/2014 0350   LABSPEC 1.021 11/25/2016 0703   LABSPEC 1.018 05/16/2014 0350   PHURINE 7.0 11/25/2016 0703   GLUCOSEU >=500 (A) 11/25/2016 0703   GLUCOSEU Negative 05/16/2014 0350   HGBUR NEGATIVE 11/25/2016 0703   BILIRUBINUR NEGATIVE 11/25/2016 0703   BILIRUBINUR Negative 05/16/2014 0350   KETONESUR 20 (A) 11/25/2016 0703   PROTEINUR NEGATIVE 11/25/2016 0703   NITRITE NEGATIVE 11/25/2016 0703   LEUKOCYTESUR NEGATIVE 11/25/2016  0703   LEUKOCYTESUR 1+ 05/16/2014 0350     RADIOLOGY: Ct Abdomen Pelvis W Contrast  Result Date: 11/25/2016 CLINICAL DATA:  36 year old male with acute abdominal pain at the left lower quadrant colostomy site. History of HIV. EXAM: CT ABDOMEN AND PELVIS WITH CONTRAST TECHNIQUE: Multidetector CT imaging of the abdomen and pelvis was performed using the standard protocol following bolus administration of intravenous contrast. CONTRAST:  ISOVUE-300 IOPAMIDOL (ISOVUE-300) INJECTION 61% COMPARISON:  02/26/2016 and prior CTs FINDINGS: Lower chest: No acute abnormality Hepatobiliary: The liver is unremarkable. Cholelithiasis noted without CT evidence of acute cholecystitis. No biliary dilatation. Pancreas: Unremarkable Spleen: Unremarkable Adrenals/Urinary Tract: The kidneys and adrenal glands are unremarkable. Mild circumferential bladder wall thickening is unchanged. Stomach/Bowel: Left lower quadrant ostomy again noted without significant change from 02/26/2016. No acute abnormalities are present. No evidence of bowel obstruction, definite bowel wall thickening or inflammatory changes. Vascular/Lymphatic: No significant vascular findings are present. No enlarged abdominal or pelvic lymph nodes. Reproductive: Prostate is unremarkable. Other: No ascites, focal collection or pneumoperitoneum. Musculoskeletal: Chronic deformity and dislocation of both hips again noted. No acute abnormalities identified. IMPRESSION: 1. No evidence  of acute abnormality 2. Left lower quadrant ostomy without significant change or acute abnormality. No evidence of bowel obstruction or bowel wall thickening. 3. Cholelithiasis without CT evidence of acute cholecystitis. Electronically Signed   By: Harmon Pier M.D.   On: 11/25/2016 09:56    EKG: Orders placed or performed in visit on 10/30/16  . EKG 12-Lead    IMPRESSION AND PLAN:  * Uncontrolled Htn   BP is 160/130 during my visit in room. HR is 120-140 with sinus tachycardia   This is due to distress of possible gastritis and missing his meds this morning due to vomiting.   Cont home meds and hydralazine IV as needed.  * nausea and vomit, abd pain   May be viral gastritis   Check gi panel.  * DM   Cont basal insuline, keep on ISS.    Holding meal time home dose.  * HIV   COnt ART, depending on further progress, may need to call ID.  * Muscular disorder   Con muscle relaxers.  * Smoking   Councelled to Quit smoking for 4 min, offered to have nicotine patch.  All the records are reviewed and case discussed with ED provider. Management plans discussed with the patient, family and they are in agreement.  CODE STATUS: Full. Code Status History    Date Active Date Inactive Code Status Order ID Comments User Context   10/06/2016  7:53 PM 10/10/2016  6:04 PM Full Code [id number]  Ramonita Lab, MD Inpatient       TOTAL TIME TAKING CARE OF THIS PATIENT: 45 minutes.    Altamese Dilling M.D on 11/25/2016   Between 7am to 6pm - Pager - [phone]  After 6pm go to [email or web address] - Social research officer, government  [facility]  Office  [phone]  CC: Primary care physician; Emogene Morgan, MD   Note: This dictation was prepared with Dragon dictation along with smaller phrase technology. Any transcriptional errors that result from this process are unintentional.

## 2016-11-25 NOTE — ED Triage Notes (Signed)
Patient arrives from home via EMS with co LLQ abdominal pain with co n/v.  Pt saw GI MD yesterday and "everything was alright"  Patient was scheduled for colostomy reversal but found out he has HIV and he stated the MD said they would have to make sure his blood counts are up before the reversal could be done.  Pt moaning in pain and holding his abdomen/colostomy bag in left hand. VS WNL

## 2016-11-26 LAB — BASIC METABOLIC PANEL
ANION GAP: 12 (ref 5–15)
BUN: 16 mg/dL (ref 6–20)
CO2: 25 mmol/L (ref 22–32)
CREATININE: 0.78 mg/dL (ref 0.61–1.24)
Calcium: 9.1 mg/dL (ref 8.9–10.3)
Chloride: 95 mmol/L — ABNORMAL LOW (ref 101–111)
GFR calc Af Amer: >60 mL/min (ref >60)
GLUCOSE: 283 mg/dL — AB (ref 65–99)
Potassium: 3.6 mmol/L (ref 3.5–5.1)
SODIUM: 132 mmol/L — AB (ref 135–145)

## 2016-11-26 LAB — CBC
HCT: 45.7 % (ref 40.0–52.0)
Hemoglobin: 14.8 g/dL (ref 13.0–18.0)
MCH: 27.7 pg (ref 26.0–34.0)
MCHC: 32.4 g/dL (ref 32.0–36.0)
MCV: 85.3 fL (ref 80.0–100.0)
PLATELETS: 288 10*3/uL (ref 150–440)
RBC: 5.35 MIL/uL (ref 4.40–5.90)
RDW: 13.7 % (ref 11.5–14.5)
WBC: 8.4 10*3/uL (ref 3.8–10.6)

## 2016-11-26 LAB — GLUCOSE, CAPILLARY
GLUCOSE-CAPILLARY: 244 mg/dL — AB (ref 65–99)
GLUCOSE-CAPILLARY: 318 mg/dL — AB (ref 65–99)

## 2016-11-26 MED ORDER — CLONIDINE HCL 0.1 MG PO TABS
0.1000 mg | ORAL_TABLET | Freq: Four times a day (QID) | ORAL | Status: DC | PRN
Start: 2016-11-26 — End: 2016-11-26
  Administered 2016-11-26: 0.1 mg via ORAL
  Filled 2016-11-26: qty 1

## 2016-11-26 NOTE — Discharge Summary (Signed)
[facility] - [place] [facility]   PATIENT NAME: Cory Campbell    MR#:  [id number]  DATE OF BIRTH:  1980/02/17  DATE OF ADMISSION:  11/25/2016   ADMITTING PHYSICIAN: Altamese Dilling, MD  DATE OF DISCHARGE: No discharge date for patient encounter.  PRIMARY CARE PHYSICIAN: Aycock, Ngwe A, MD   ADMISSION DIAGNOSIS:  Left lower quadrant pain [R10.32] DISCHARGE DIAGNOSIS:  Principal Problem:   Uncontrolled hypertension Active Problems:   Abdominal pain  SECONDARY DIAGNOSIS:   Past Medical History:  Diagnosis Date  . Abrasion or friction burn of foot and toe(s), without mention of infection   . Acid reflux   . Adopted   . Allergic rhinitis, cause unspecified   . Anxiety   . Arthritis   . Arthrogryposis   . Asthma   . Bladder wall thickening   . BPH (benign prostatic hyperplasia)   . Chest pain, unspecified   . Chronic pain syndrome   . Depressive disorder, not elsewhere classified   . Diabetes mellitus without complication (HCC)   . Diverticulitis of colon (without mention of hemorrhage)(562.11)   . ED (erectile dysfunction)   . Herpes genitalis   . HIV infection (HCC)   . HTN (hypertension)   . Hyperlipidemia   . Hypogonadism in male   . Myalgia and myositis, unspecified   . OAB (overactive bladder)   . Obesity   . Other and unspecified noninfectious gastroenteritis and colitis(558.9)   . Other convulsions   . Other psoriasis   . 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 Premature baby    born premature  . Sleep apnea   . 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 or other cause, without mention of thyrotoxic crisis or storm    hyperthyroidism  . TIA (transient ischemic attack)   . Type II or unspecified type diabetes mellitus with neurological manifestations, not stated as uncontrolled(250.60)    HOSPITAL COURSE:   * Accelerated hypertension    the patient has been treated with home meds and hydralazine IV as  needed.  * nausea and vomit, abd pain, improved.   May be viral gastritis Negative gi panel.  * DM   Cont basal insuline, keep on ISS.    Holding meal time home dose.  * HIV   COnt ART.  * Muscular disorder   Con muscle relaxers.  * Smoking   Councelled to Quit smoking for 4 min, offered to have nicotine patch.  DISCHARGE CONDITIONS:  Stable, discharge home today. CONSULTS OBTAINED:   DRUG ALLERGIES:   Allergies  Allergen Reactions  . Penicillins Itching, Rash and Hives    Has patient had a PCN reaction causing immediate rash, facial/tongue/throat swelling, SOB or lightheadedness with hypotension: Yes Has patient had a PCN reaction causing severe rash involving mucus membranes or skin necrosis: No Has patient had a PCN reaction that required hospitalization No Has patient had a PCN reaction occurring within the last 10 years: No If all of the above answers are "NO", then may proceed with Cephalosporin use. Has patient had a PCN reaction causing immediate rash, facial/tongue/throat swelling, SOB or lightheadedness with hypotension: Yes Has patient had a PCN reaction causing severe rash involving mucus membranes or skin necrosis: No Has patient had a PCN reaction that required hospitalization No Has patient had a PCN reaction occurring within the last 10 years: No If all of the above answers are "NO", then may proceed with Cephalosporin use.  . Erythromycin Base Itching and Rash  .  Erythromycin Base Itching and Rash   DISCHARGE MEDICATIONS:   Allergies as of 11/26/2016      Reactions   Penicillins Itching, Rash, Hives   Has patient had a PCN reaction causing immediate rash, facial/tongue/throat swelling, SOB or lightheadedness with hypotension: Yes Has patient had a PCN reaction causing severe rash involving mucus membranes or skin necrosis: No Has patient had a PCN reaction that required hospitalization No Has patient had a PCN reaction occurring within the last  10 years: No If all of the above answers are "NO", then may proceed with Cephalosporin use. Has patient had a PCN reaction causing immediate rash, facial/tongue/throat swelling, SOB or lightheadedness with hypotension: Yes Has patient had a PCN reaction causing severe rash involving mucus membranes or skin necrosis: No Has patient had a PCN reaction that required hospitalization No Has patient had a PCN reaction occurring within the last 10 years: No If all of the above answers are "NO", then may proceed with Cephalosporin use.   Erythromycin Base Itching, Rash   Erythromycin Base Itching, Rash      Medication List    TAKE these medications   acyclovir 400 MG tablet Commonly known as:  ZOVIRAX Take 400 mg by mouth 2 (two) times daily.   albuterol (2.5 MG/3ML) 0.083% nebulizer solution Commonly known as:  PROVENTIL Take 2.5 mg by nebulization every 4 (four) hours as needed for wheezing or shortness of breath.   albuterol 108 (90 Base) MCG/ACT inhaler Commonly known as:  PROVENTIL HFA;VENTOLIN HFA Inhale 2 puffs into the lungs every 4 (four) hours as needed for wheezing or shortness of breath.   alfuzosin 10 MG 24 hr tablet Commonly known as:  UROXATRAL Take 10 mg by mouth daily.   amLODipine 10 MG tablet Commonly known as:  NORVASC Take 10 mg by mouth daily.   aspirin 81 MG tablet Take 81 mg by mouth daily.   BIKTARVY 50-200-25 MG Tabs tablet Generic drug:  bictegravir-emtricitabine-tenofovir AF Take 1 tablet by mouth daily.   buPROPion 150 MG 24 hr tablet Commonly known as:  WELLBUTRIN XL Take 1 tablet by mouth daily.   BUTRANS 15 MCG/HR Ptwk Generic drug:  Buprenorphine Place 15 mg onto the skin once a week. Saturday   chlorthalidone 25 MG tablet Commonly known as:  HYGROTON Take 1 tablet by mouth daily.   citalopram 20 MG tablet Commonly known as:  CELEXA Take 20 mg by mouth daily.   cloNIDine 0.1 mg/24hr patch Commonly known as:  CATAPRES - Dosed in mg/24  hr Place 0.1 mg onto the skin once a week.   diclofenac sodium 1 % Gel Commonly known as:  VOLTAREN Apply 2-4 g topically 4 (four) times daily as needed. For pain.   dicyclomine 20 MG tablet Commonly known as:  BENTYL Take 20 mg by mouth every 6 (six) hours.   DOCOSAHEXAENOIC ACID PO Take 1 tablet by mouth daily.   famotidine 20 MG tablet Commonly known as:  PEPCID Take 20 mg by mouth 2 (two) times daily.   feeding supplement (ENSURE ENLIVE) Liqd Take 237 mLs by mouth 2 (two) times daily between meals.   finasteride 5 MG tablet Commonly known as:  PROSCAR Take 5 mg by mouth daily.   fluticasone 50 MCG/ACT nasal spray Commonly known as:  FLONASE Place 2 sprays into the nose daily.   hydrocortisone cream 0.5 % Apply 1 application topically 2 (two) times daily.   HYDROmorphone 4 MG tablet Commonly known as:  DILAUDID Take 1  tablet (4 mg total) by mouth every 3 (three) hours as needed. What changed:  when to take this   ketoconazole 2 % shampoo Commonly known as:  NIZORAL Apply 1 application topically 2 (two) times a week. tues and thursday   lisinopril 40 MG tablet Commonly known as:  PRINIVIL,ZESTRIL Take 40 mg by mouth daily.   loratadine 10 MG tablet Commonly known as:  CLARITIN Take 10 mg by mouth daily.   metoprolol tartrate 100 MG tablet Commonly known as:  LOPRESSOR Take 100 mg by mouth 2 (two) times daily.   mirtazapine 45 MG tablet Commonly known as:  REMERON Take 45 mg by mouth at bedtime.   multivitamin tablet Take 1 tablet by mouth daily.   naloxone 4 MG/0.1ML Liqd nasal spray kit Commonly known as:  NARCAN One spray in either nostril once for known/suspected opioid overdose. May repeat every 2-3 minutes in alternating nostril til EMS arrives   nitroGLYCERIN 0.4 MG SL tablet Commonly known as:  NITROSTAT Place 1 tablet (0.4 mg total) under the tongue every 5 (five) minutes as needed for chest pain. Call 911 if chest is not resolved w/ 3  tabs.   nortriptyline 25 MG capsule Commonly known as:  PAMELOR Take 50 mg by mouth at bedtime.   NOVOLOG FLEXPEN 100 UNIT/ML FlexPen Generic drug:  insulin aspart Inject 10 Units into the skin 3 (three) times daily with meals. With every meal and every snack   nystatin powder Commonly known as:  MYCOSTATIN/NYSTOP Apply topically 3 (three) times daily. For 10 days   nystatin-triamcinolone cream Commonly known as:  MYCOLOG II Apply 1 application topically 2 (two) times daily.   omeprazole 20 MG capsule Commonly known as:  PRILOSEC Take 20 mg by mouth daily.   ondansetron 4 MG disintegrating tablet Commonly known as:  ZOFRAN-ODT Take 4 mg by mouth every 8 (eight) hours as needed for nausea or vomiting.   polyethylene glycol packet Commonly known as:  MIRALAX / GLYCOLAX Take 17 g by mouth 3 (three) times daily.   potassium chloride SA 20 MEQ tablet Commonly known as:  K-DUR,KLOR-CON take 1 tablet by mouth once daily if needed What changed:  See the new instructions.   pravastatin 80 MG tablet Commonly known as:  PRAVACHOL take 1 tablet by mouth at bedtime   prednisoLONE acetate 1 % ophthalmic suspension Commonly known as:  PRED FORTE Place 1 drop into the right eye daily.   RA MELATONIN 1 MG Subl Generic drug:  Melatonin Take 1 mg by mouth at bedtime.   solifenacin 10 MG tablet Commonly known as:  VESICARE Take 10 mg by mouth daily.   tiZANidine 4 MG capsule Commonly known as:  ZANAFLEX Take 4 mg by mouth 3 (three) times daily as needed.   TRESIBA FLEXTOUCH 100 UNIT/ML Sopn FlexTouch Pen Generic drug:  insulin degludec Inject 50 Units into the skin daily at 10 pm.   zolpidem 10 MG tablet Commonly known as:  AMBIEN Take 10 mg by mouth at bedtime.        DISCHARGE INSTRUCTIONS:  See AVS.  If you experience worsening of your admission symptoms, develop shortness of breath, life threatening emergency, suicidal or homicidal thoughts you must seek medical  attention immediately by calling 911 or calling your MD immediately  if symptoms less severe.  You Must read complete instructions/literature along with all the possible adverse reactions/side effects for all the Medicines you take and that have been prescribed to you. Take any new  Medicines after you have completely understood and accpet all the possible adverse reactions/side effects.   Please note  You were cared for by a hospitalist during your hospital stay. If you have any questions about your discharge medications or the care you received while you were in the hospital after you are discharged, you can call the unit and asked to speak with the hospitalist on call if the hospitalist that took care of you is not available. Once you are discharged, your primary care physician will handle any further medical issues. Please note that NO REFILLS for any discharge medications will be authorized once you are discharged, as it is imperative that you return to your primary care physician (or establish a relationship with a primary care physician if you do not have one) for your aftercare needs so that they can reassess your need for medications and monitor your lab values.    On the day of Discharge:  VITAL SIGNS:  Blood pressure (!) 157/118, pulse 89, temperature 98.7 F (37.1 C), temperature source Oral, resp. rate 18, height '5\' 5"'$  (1.651 m), weight 185 lb (83.9 kg), SpO2 100 %. PHYSICAL EXAMINATION:  GENERAL:  36 y.o.-year-old patient lying in the bed with no acute distress.  EYES: Pupils equal, round, reactive to light and accommodation. No scleral icterus. Extraocular muscles intact.  HEENT: Head atraumatic, normocephalic. Oropharynx and nasopharynx clear.  NECK:  Supple, no jugular venous distention. No thyroid enlargement, no tenderness.  LUNGS: Normal breath sounds bilaterally, no wheezing, rales,rhonchi or crepitation. No use of accessory muscles of respiration.  CARDIOVASCULAR: S1, S2  normal. No murmurs, rubs, or gallops.  ABDOMEN: Soft, non-tender, non-distended. Bowel sounds present. No organomegaly or mass.  EXTREMITIES: No pedal edema, cyanosis, or clubbing.  NEUROLOGIC: Cranial nerves II through XII are intact. Muscle strength 5/5 in all extremities. Sensation intact. Gait not checked.  PSYCHIATRIC: The patient is alert and oriented x 3.  SKIN: No obvious rash, lesion, or ulcer.  DATA REVIEW:   CBC  Recent Labs Lab 11/26/16 0307  WBC 8.4  HGB 14.8  HCT 45.7  PLT 288    Chemistries   Recent Labs Lab 11/25/16 0703 11/26/16 0307  NA 135 132*  K 3.6 3.6  CL 95* 95*  CO2 27 25  GLUCOSE 261* 283*  BUN 19 16  CREATININE 0.79 0.78  CALCIUM 9.3 9.1  AST 29  --   ALT 21  --   ALKPHOS 58  --   BILITOT 0.4  --      Microbiology Results  Results for orders placed or performed during the hospital encounter of 11/25/16  Gastrointestinal Panel by PCR , Stool     Status: None   Collection Time: 11/25/16  3:15 PM  Result Value Ref Range Status   Campylobacter species NOT DETECTED NOT DETECTED Final   Plesimonas shigelloides NOT DETECTED NOT DETECTED Final   Salmonella species NOT DETECTED NOT DETECTED Final   Yersinia enterocolitica NOT DETECTED NOT DETECTED Final   Vibrio species NOT DETECTED NOT DETECTED Final   Vibrio cholerae NOT DETECTED NOT DETECTED Final   Enteroaggregative E coli (EAEC) NOT DETECTED NOT DETECTED Final   Enteropathogenic E coli (EPEC) NOT DETECTED NOT DETECTED Final   Enterotoxigenic E coli (ETEC) NOT DETECTED NOT DETECTED Final   Shiga like toxin producing E coli (STEC) NOT DETECTED NOT DETECTED Final   Shigella/Enteroinvasive E coli (EIEC) NOT DETECTED NOT DETECTED Final   Cryptosporidium NOT DETECTED NOT DETECTED Final   Cyclospora cayetanensis  NOT DETECTED NOT DETECTED Final   Entamoeba histolytica NOT DETECTED NOT DETECTED Final   Giardia lamblia NOT DETECTED NOT DETECTED Final   Adenovirus F40/41 NOT DETECTED NOT  DETECTED Final   Astrovirus NOT DETECTED NOT DETECTED Final   Norovirus GI/GII NOT DETECTED NOT DETECTED Final   Rotavirus A NOT DETECTED NOT DETECTED Final   Sapovirus (I, II, IV, and V) NOT DETECTED NOT DETECTED Final    RADIOLOGY:  No results found.   Management plans discussed with the patient, family and they are in agreement.  CODE STATUS: Full Code   TOTAL TIME TAKING CARE OF THIS PATIENT: 26 minutes.    Demetrios Loll M.D on 11/26/2016 at 1:47 PM  Between 7am to 6pm - Pager - [phone]  After 6pm go to [email or web address] - [employer] [facility]  Office  [phone]  CC: Primary care physician; Donnie Coffin, MD   Note: This dictation was prepared with Dragon dictation along with smaller phrase technology. Any transcriptional errors that result from this process are unintentional.

## 2016-11-26 NOTE — Progress Notes (Signed)
Pt alert and oriented. Medicated for pain x1 with good results. No complaints of nausa or vomiting this shift. Pt using urinal without difficulty.

## 2016-11-26 NOTE — Discharge Instructions (Signed)
Heart healthy and ADA diet. °

## 2016-11-26 NOTE — Progress Notes (Signed)
Patient is being discharged to home. DC instructions given and patient acknowledged understanding.  IV removed. Belongings packed. Spoke to CM/SW who prepared non-emgergent transport via EMS for patient since he does not walk.

## 2016-11-26 NOTE — Plan of Care (Signed)
Problem: Safety: Goal: Ability to remain free from injury will improve Outcome: Progressing Pt alert and oriented and makes needs known  Problem: Pain Managment: Goal: General experience of comfort will improve Outcome: Progressing Pain control with oral pain medication  Problem: Nutrition: Goal: Adequate nutrition will be maintained Outcome: Progressing Eating and drinking without difficulty.

## 2016-12-05 ENCOUNTER — Emergency Department
Admission: EM | Admit: 2016-12-05 | Discharge: 2016-12-05 | Disposition: A | Discharge status: Home / Self Care | Payer: Medicaid Other | Source: Home / Self Care | Attending: Emergency Medicine | Admitting: Emergency Medicine

## 2016-12-05 ENCOUNTER — Emergency Department
Admission: EM | Admit: 2016-12-05 | Discharge: 2016-12-05 | Disposition: A | Discharge status: Home / Self Care | Payer: Medicaid Other | Attending: Emergency Medicine | Admitting: Emergency Medicine

## 2016-12-05 ENCOUNTER — Emergency Department: Payer: Medicaid Other

## 2016-12-05 ENCOUNTER — Encounter: Payer: Self-pay | Admitting: Emergency Medicine

## 2016-12-05 DIAGNOSIS — F17200 Nicotine dependence, unspecified, uncomplicated: Secondary | ICD-10-CM | POA: Insufficient documentation

## 2016-12-05 DIAGNOSIS — R109 Unspecified abdominal pain: Secondary | ICD-10-CM | POA: Diagnosis not present

## 2016-12-05 DIAGNOSIS — I10 Essential (primary) hypertension: Secondary | ICD-10-CM | POA: Diagnosis not present

## 2016-12-05 DIAGNOSIS — E1149 Type 2 diabetes mellitus with other diabetic neurological complication: Secondary | ICD-10-CM | POA: Insufficient documentation

## 2016-12-05 DIAGNOSIS — G8929 Other chronic pain: Secondary | ICD-10-CM | POA: Diagnosis not present

## 2016-12-05 DIAGNOSIS — Z794 Long term (current) use of insulin: Secondary | ICD-10-CM | POA: Diagnosis not present

## 2016-12-05 DIAGNOSIS — R739 Hyperglycemia, unspecified: Secondary | ICD-10-CM

## 2016-12-05 DIAGNOSIS — Z79899 Other long term (current) drug therapy: Secondary | ICD-10-CM | POA: Diagnosis not present

## 2016-12-05 DIAGNOSIS — Z7982 Long term (current) use of aspirin: Secondary | ICD-10-CM | POA: Diagnosis not present

## 2016-12-05 DIAGNOSIS — Z933 Colostomy status: Secondary | ICD-10-CM | POA: Insufficient documentation

## 2016-12-05 DIAGNOSIS — Z7902 Long term (current) use of antithrombotics/antiplatelets: Secondary | ICD-10-CM | POA: Insufficient documentation

## 2016-12-05 DIAGNOSIS — R1110 Vomiting, unspecified: Secondary | ICD-10-CM | POA: Insufficient documentation

## 2016-12-05 DIAGNOSIS — R197 Diarrhea, unspecified: Secondary | ICD-10-CM | POA: Insufficient documentation

## 2016-12-05 DIAGNOSIS — R000 Tachycardia, unspecified: Secondary | ICD-10-CM

## 2016-12-05 DIAGNOSIS — J45909 Unspecified asthma, uncomplicated: Secondary | ICD-10-CM | POA: Insufficient documentation

## 2016-12-05 DIAGNOSIS — R1032 Left lower quadrant pain: Secondary | ICD-10-CM

## 2016-12-05 LAB — URINALYSIS, COMPLETE (UACMP) WITH MICROSCOPIC
Bacteria, UA: NONE SEEN
Bilirubin Urine: NEGATIVE
Glucose, UA: >=500 mg/dL — AB
Hgb urine dipstick: NEGATIVE
Ketones, ur: NEGATIVE mg/dL
NITRITE: NEGATIVE
Protein, ur: NEGATIVE mg/dL
SPECIFIC GRAVITY, URINE: 1.023 (ref 1.005–1.030)
pH: 5 (ref 5.0–8.0)

## 2016-12-05 LAB — COMPREHENSIVE METABOLIC PANEL
ALK PHOS: 74 U/L (ref 38–126)
ALK PHOS: 77 U/L (ref 38–126)
ALT: 22 U/L (ref 17–63)
ALT: 17 U/L (ref 17–63)
ANION GAP: 9 (ref 5–15)
ANION GAP: 10 (ref 5–15)
AST: 28 U/L (ref 15–41)
AST: 24 U/L (ref 15–41)
Albumin: 3.6 g/dL (ref 3.5–5.0)
Albumin: 3.8 g/dL (ref 3.5–5.0)
BILIRUBIN TOTAL: 0.7 mg/dL (ref 0.3–1.2)
BUN: 14 mg/dL (ref 6–20)
BUN: 12 mg/dL (ref 6–20)
CALCIUM: 9.2 mg/dL (ref 8.9–10.3)
CO2: 23 mmol/L (ref 22–32)
CO2: 26 mmol/L (ref 22–32)
CREATININE: 0.76 mg/dL (ref 0.61–1.24)
Calcium: 9 mg/dL (ref 8.9–10.3)
Chloride: 103 mmol/L (ref 101–111)
Chloride: 100 mmol/L — ABNORMAL LOW (ref 101–111)
Creatinine, Ser: 0.89 mg/dL (ref 0.61–1.24)
GFR calc non Af Amer: >60 mL/min (ref >60)
GFR calc non Af Amer: >60 mL/min (ref >60)
GLUCOSE: 280 mg/dL — AB (ref 65–99)
GLUCOSE: 251 mg/dL — AB (ref 65–99)
Potassium: 3.7 mmol/L (ref 3.5–5.1)
Potassium: 4.2 mmol/L (ref 3.5–5.1)
SODIUM: 136 mmol/L (ref 135–145)
Sodium: 135 mmol/L (ref 135–145)
TOTAL PROTEIN: 7.6 g/dL (ref 6.5–8.1)
Total Bilirubin: 0.6 mg/dL (ref 0.3–1.2)
Total Protein: 7.3 g/dL (ref 6.5–8.1)

## 2016-12-05 LAB — CBC
HCT: 49.8 % (ref 40.0–52.0)
HCT: 48.2 % (ref 40.0–52.0)
HEMOGLOBIN: 16.1 g/dL (ref 13.0–18.0)
Hemoglobin: 15.7 g/dL (ref 13.0–18.0)
MCH: 27.3 pg (ref 26.0–34.0)
MCH: 27.5 pg (ref 26.0–34.0)
MCHC: 32.4 g/dL (ref 32.0–36.0)
MCHC: 32.6 g/dL (ref 32.0–36.0)
MCV: 84.2 fL (ref 80.0–100.0)
MCV: 84.3 fL (ref 80.0–100.0)
PLATELETS: 284 10*3/uL (ref 150–440)
PLATELETS: 298 10*3/uL (ref 150–440)
RBC: 5.91 MIL/uL — ABNORMAL HIGH (ref 4.40–5.90)
RBC: 5.72 MIL/uL (ref 4.40–5.90)
RDW: 13.8 % (ref 11.5–14.5)
RDW: 13.6 % (ref 11.5–14.5)
WBC: 11.4 10*3/uL — ABNORMAL HIGH (ref 3.8–10.6)
WBC: 10.1 10*3/uL (ref 3.8–10.6)

## 2016-12-05 LAB — LIPASE, BLOOD
Lipase: 24 U/L (ref 11–51)
Lipase: 25 U/L (ref 11–51)

## 2016-12-05 LAB — TROPONIN I

## 2016-12-05 MED ORDER — ONDANSETRON HCL 4 MG/2ML IJ SOLN
4.0000 mg | Freq: Once | INTRAMUSCULAR | Status: AC
  Administered 2016-12-05: 4 mg via INTRAVENOUS
  Filled 2016-12-05: qty 2

## 2016-12-05 MED ORDER — IOPAMIDOL (ISOVUE-300) INJECTION 61%
100.0000 mL | Freq: Once | INTRAVENOUS | Status: AC | PRN
  Administered 2016-12-05: 100 mL via INTRAVENOUS

## 2016-12-05 MED ORDER — ONDANSETRON 4 MG PO TBDP
ORAL_TABLET | ORAL | Status: AC
  Administered 2016-12-05: 4 mg via ORAL
  Filled 2016-12-05: qty 1

## 2016-12-05 MED ORDER — SODIUM CHLORIDE 0.9 % IV BOLUS (SEPSIS)
1000.0000 mL | Freq: Once | INTRAVENOUS | Status: AC
  Administered 2016-12-05: 1000 mL via INTRAVENOUS

## 2016-12-05 MED ORDER — ACETAMINOPHEN 500 MG PO TABS
1000.0000 mg | ORAL_TABLET | Freq: Once | ORAL | Status: AC
  Administered 2016-12-05: 1000 mg via ORAL
  Filled 2016-12-05: qty 2

## 2016-12-05 MED ORDER — ONDANSETRON 4 MG PO TBDP
4.0000 mg | ORAL_TABLET | Freq: Once | ORAL | Status: AC
  Administered 2016-12-05: 4 mg via ORAL

## 2016-12-05 MED ORDER — IOPAMIDOL (ISOVUE-300) INJECTION 61%
30.0000 mL | Freq: Once | INTRAVENOUS | Status: AC | PRN
  Administered 2016-12-05: 30 mL via ORAL

## 2016-12-05 NOTE — ED Provider Notes (Signed)
[facility] Emergency Department Provider Note  ____________________________________________   First MD Initiated Contact with Patient 12/05/16 [phone]     (approximate)  I have reviewed the triage vital signs and the nursing notes.   HISTORY  Chief Complaint Abdominal Pain; Emesis; and Diarrhea    HPI Cory Campbell is a 36 y.o. male with extensive history including a colostomy and who is a patient of Dr. Epifania Gore at [facility].  He has been seen several times recently in this emergency department and, according to the medical record, has been seen multiple times in the [facility] emergency department as well.  He has also had an office visit with his surgeon within the last 2 weeks for the same issue of abdominal pain around the colostomy site.  He reports that the pain continues, no better and no worse.  Nothing in particular affects the symptoms.  He is not able to verbalize to me what was the plan from his surgeon.  He states that he has had some vomiting and loose output in the colostomy.  He denies fever/chills, shortness of breath, and cough.  He describes the symptoms as severe.  Past Medical History:  Diagnosis Date  . Abrasion or friction burn of foot and toe(s), without mention of infection   . Acid reflux   . Adopted   . Allergic rhinitis, cause unspecified   . Anxiety   . Arthritis   . Arthrogryposis   . Asthma   . Bladder wall thickening   . BPH (benign prostatic hyperplasia)   . Chest pain, unspecified   . Chronic pain syndrome   . Depressive disorder, not elsewhere classified   . Diabetes mellitus without complication (HCC)   . Diverticulitis of colon (without mention of hemorrhage)(562.11)   . ED (erectile dysfunction)   . Herpes genitalis   . HIV infection (HCC)   . HTN (hypertension)   . Hyperlipidemia   . Hypogonadism in male   . Myalgia and myositis, unspecified   . OAB (overactive bladder)   . Obesity   . Other and unspecified  noninfectious gastroenteritis and colitis(558.9)   . Other convulsions   . Other psoriasis   . 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 Premature baby    born premature  . Sleep apnea   . 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 or other cause, without mention of thyrotoxic crisis or storm    hyperthyroidism  . TIA (transient ischemic attack)   . Type II or unspecified type diabetes mellitus with neurological manifestations, not stated as uncontrolled(250.60)     Patient Active Problem List   Diagnosis Date Noted  . Abdominal pain 11/25/2016  . Hypotension 10/08/2016  . Unstable angina (HCC) 10/06/2016  . Shortness of breath 06/23/2015  . Obesity 05/18/2014  . Frequent falls 08/12/2013  . Pain of left clavicle 08/12/2013  . Tachycardia 01/21/2013  . Obstructive sleep apnea on CPAP 06/07/2012  . Chest pain 06/20/2011  . Uncontrolled hypertension 06/20/2011  . Diabetes mellitus (HCC) 06/20/2011    Past Surgical History:  Procedure Laterality Date  . APPENDECTOMY    . COLOSTOMY    . CORNEAL TRANSPLANT    . feet surgery     both  . HAND SURGERY     left and right  . leg surgery     left and right  . ORTHOPEDIC SURGERY     hands, feet, knees, legs  . tubes in ears     both  Prior to Admission medications   Medication Sig Start Date End Date Taking? Authorizing Provider  acyclovir (ZOVIRAX) 400 MG tablet Take 400 mg by mouth 2 (two) times daily.    [provider]  albuterol (PROVENTIL HFA;VENTOLIN HFA) 108 (90 BASE) MCG/ACT inhaler Inhale 2 puffs into the lungs every 4 (four) hours as needed for wheezing or shortness of breath.     [provider]  albuterol (PROVENTIL) (2.5 MG/3ML) 0.083% nebulizer solution Take 2.5 mg by nebulization every 4 (four) hours as needed for wheezing or shortness of breath.    [provider]  alfuzosin (UROXATRAL) 10 MG 24 hr tablet Take 10 mg by mouth daily.    [provider]  amLODipine  (NORVASC) 10 MG tablet Take 10 mg by mouth daily.    [provider]  aspirin 81 MG tablet Take 81 mg by mouth daily.    [provider]  BIKTARVY 50-200-25 MG TABS tablet Take 1 tablet by mouth daily. 11/21/16   [provider]  Buprenorphine (BUTRANS) 15 MCG/HR PTWK Place 15 mg onto the skin once a week. Saturday    [provider]  buPROPion (WELLBUTRIN XL) 150 MG 24 hr tablet Take 1 tablet by mouth daily. 11/16/16   [provider]  chlorthalidone (HYGROTON) 25 MG tablet Take 1 tablet by mouth daily. 11/15/16   [provider]  citalopram (CELEXA) 20 MG tablet Take 20 mg by mouth daily.    [provider]  cloNIDine (CATAPRES - DOSED IN MG/24 HR) 0.1 mg/24hr patch Place 0.1 mg onto the skin once a week.    [provider]  diclofenac sodium (VOLTAREN) 1 % GEL Apply 2-4 g topically 4 (four) times daily as needed. For pain.    [provider]  dicyclomine (BENTYL) 20 MG tablet Take 20 mg by mouth every 6 (six) hours.    [provider]  DOCOSAHEXAENOIC ACID PO Take 1 tablet by mouth daily.     [provider]  famotidine (PEPCID) 20 MG tablet Take 20 mg by mouth 2 (two) times daily.  01/03/16   [provider]  feeding supplement, ENSURE ENLIVE, (ENSURE ENLIVE) LIQD Take 237 mLs by mouth 2 (two) times daily between meals. 10/10/16   Altamese DillingVachhani, Vaibhavkumar, MD  finasteride (PROSCAR) 5 MG tablet Take 5 mg by mouth daily.    [provider]  fluticasone (FLONASE) 50 MCG/ACT nasal spray Place 2 sprays into the nose daily.    [provider]  hydrocortisone cream 0.5 % Apply 1 application topically 2 (two) times daily.    [provider]  HYDROmorphone (DILAUDID) 4 MG tablet Take 1 tablet (4 mg total) by mouth every 3 (three) hours as needed. Patient taking differently: Take 4 mg by mouth 2 (two) times daily as needed.  02/26/16   Willy Eddyobinson, Patrick, MD  insulin degludec  (TRESIBA FLEXTOUCH) 100 UNIT/ML SOPN FlexTouch Pen Inject 50 Units into the skin daily at 10 pm.     [provider]  ketoconazole (NIZORAL) 2 % shampoo Apply 1 application topically 2 (two) times a week. tues and thursday 08/27/14   [provider]  lisinopril (PRINIVIL,ZESTRIL) 40 MG tablet Take 40 mg by mouth daily.    [provider]  loratadine (CLARITIN) 10 MG tablet Take 10 mg by mouth daily.    [provider]  metoprolol tartrate (LOPRESSOR) 100 MG tablet Take 100 mg by mouth 2 (two) times daily.    [provider]  mirtazapine (REMERON) 45 MG tablet Take 45 mg by mouth at bedtime.    [provider]  Multiple Vitamin (MULTIVITAMIN) tablet Take 1 tablet by mouth daily.    [provider]  naloxone [facility]) nasal spray 4 mg/0.1 mL One spray in either nostril once for known/suspected opioid overdose. May repeat every 2-3 minutes in alternating nostril til EMS arrives 01/12/16   [provider]  nitroGLYCERIN (NITROSTAT) 0.4 MG SL tablet Place 1 tablet (0.4 mg total) under the tongue every 5 (five) minutes as needed for chest pain. Call 911 if chest is not resolved w/ 3 tabs. 11/02/14   Antonieta Iba, MD  nortriptyline (PAMELOR) 25 MG capsule Take 50 mg by mouth at bedtime.    [provider]  NOVOLOG FLEXPEN 100 UNIT/ML FlexPen Inject 10 Units into the skin 3 (three) times daily with meals. With every meal and every snack 12/13/15   [provider]  nystatin (MYCOSTATIN) powder Apply topically 3 (three) times daily. For 10 days Patient not taking: Reported on 11/25/2016 05/19/15   Renford Dills, NP  nystatin-triamcinolone [facility] II) cream Apply 1 application topically 2 (two) times daily.    [provider]  omeprazole (PRILOSEC) 20 MG capsule Take 20 mg by mouth daily.    [provider]  ondansetron (ZOFRAN-ODT) 4 MG disintegrating tablet Take 4 mg by mouth every 8 (eight) hours as  needed for nausea or vomiting.    [provider]  polyethylene glycol (MIRALAX / GLYCOLAX) packet Take 17 g by mouth 3 (three) times daily.     [provider]  potassium chloride SA (K-DUR,KLOR-CON) 20 MEQ tablet take 1 tablet by mouth once daily if needed Patient taking differently: take 1 tablet by mouth once daily 02/08/16   Antonieta Iba, MD  pravastatin (PRAVACHOL) 80 MG tablet take 1 tablet by mouth at bedtime 10/10/16   Antonieta Iba, MD  prednisoLONE acetate (PRED FORTE) 1 % ophthalmic suspension Place 1 drop into the right eye daily.     [provider]  RA MELATONIN 1 MG SUBL Take 1 mg by mouth at bedtime.  12/27/15   [provider]  solifenacin (VESICARE) 10 MG tablet Take 10 mg by mouth daily.    [provider]  tiZANidine (ZANAFLEX) 4 MG capsule Take 4 mg by mouth 3 (three) times daily as needed.  01/12/16 01/11/17  [provider]  zolpidem (AMBIEN) 10 MG tablet Take 10 mg by mouth at bedtime.    [provider]    Allergies Penicillins; Erythromycin base; and Erythromycin base  Family History  Problem Relation Age of Onset  . Family history unknown: Yes    Social History Social History  Substance Use Topics  . Smoking status: Current Every Day Smoker    Years: 0.00  . Smokeless tobacco: Never Used     Comment: quit smoking friday 08/18/2016  . Alcohol use No     Comment: used to be moderate    Review of Systems Constitutional: No fever/chills Eyes: No visual changes. ENT: No sore throat. Cardiovascular: Denies chest pain. Respiratory: Denies shortness of breath. Gastrointestinal: +abdominal pain.  Some vomiting and loose stool in the colostomy Genitourinary: Negative for dysuria. Musculoskeletal: Negative for neck pain.  Negative for back pain. Integumentary: Negative for rash. Neurological: Negative for headaches, focal weakness or  numbness.   ____________________________________________   PHYSICAL EXAM:  VITAL SIGNS: ED Triage Vitals  Enc Vitals Group     BP 12/05/16  0147 131/88     Pulse Rate 12/05/16 0147 (!) 106     Resp 12/05/16 0147 20     Temp 12/05/16 0147 98.3 F (36.8 C)     Temp Source 12/05/16 0147 Oral     SpO2 12/05/16 0147 96 %     Weight --      Height --      Head Circumference --      Peak Flow --      Pain Score 12/05/16 0148 9     Pain Loc --      Pain Edu? --      Excl. in GC? --     Constitutional: Alert and oriented.  No acute distress Eyes: Conjunctivae are normal.  Head: Atraumatic. Nose: No congestion/rhinnorhea. Mouth/Throat: Mucous membranes are somewhat dry Neck: No stridor.  No meningeal signs.   Cardiovascular: Mild tachycardia, regular rhythm. Good peripheral circulation. Grossly normal heart sounds. Respiratory: Normal respiratory effort.  No retractions. Lungs CTAB. Gastrointestinal: Soft with diffuse tenderness throughout.  Ostomy site is well appearing, bag was just changed so there is no output Musculoskeletal: No lower extremity tenderness nor edema. No gross deformities of extremities. Neurologic:  Normal speech and language. No gross focal neurologic deficits are appreciated.  Skin:  Skin is warm, dry and intact. No rash noted. Psychiatric: Mood and affect are normal. Speech and behavior are normal.  ____________________________________________   LABS (all labs ordered are listed, but only abnormal results are displayed)  Labs Reviewed  COMPREHENSIVE METABOLIC PANEL - Abnormal; Notable for the following:       Result Value   Glucose, Bld 280 (*)    All other components within normal limits  CBC - Abnormal; Notable for the following:    WBC 11.4 (*)    RBC 5.91 (*)    All other components within normal limits  LIPASE, BLOOD   ____________________________________________  EKG  None - EKG not ordered by ED  physician ____________________________________________  RADIOLOGY   Dg Abdomen Acute W/chest  Result Date: 12/05/2016 CLINICAL DATA:  Persistent abdominal pain at colostomy site. EXAM: DG ABDOMEN ACUTE W/ 1V CHEST COMPARISON:  CT 10 days prior 11/25/2016 FINDINGS: Low lung volumes. The cardiomediastinal contours are normal. The lungs are clear. There is no free intra-abdominal air. No dilated bowel loops to suggest obstruction. Small volume of stool throughout the colon. No radiopaque calculi. No acute osseous abnormalities are seen. Chronic deformity of both hips. IMPRESSION: 1. Normal bowel gas pattern.  No obstruction or free air. 2. Low lung volumes without acute abnormality. 3. Gallstones on recent CT are not well demonstrated radiographically. Electronically Signed   By: Rubye Oaks M.D.   On: 12/05/2016 05:40    ____________________________________________   PROCEDURES  Critical Care performed: No   Procedure(s) performed:   Procedures   ____________________________________________   INITIAL IMPRESSION / ASSESSMENT AND PLAN / ED COURSE  As part of my medical decision making, I reviewed the following data within the electronic MEDICAL RECORD NUMBER Nursing notes reviewed and incorporated, Labs reviewed , Old chart reviewed, Radiograph reviewed  and Notes from prior ED visits    Differential diagnosis includes, but is not limited to, acute appendicitis, renal colic, testicular torsion, urinary tract infection/pyelonephritis, prostatitis,  epididymitis, diverticulitis, small bowel obstruction or ileus, colitis, abdominal aortic aneurysm, gastroenteritis, hernia, etc. however, the patient has been seen multiple times at this facility and others recently for the same symptoms and he has received at least 2 CT scans that  were reassuring, several acute abdominal radiograph series that were also reassuring, and has seen surgeon within the last 2 weeks.  He does have very mild  tachycardia initially but it resolved while in his exam room.  Passed a p.o. challenge without difficulty.  His lab work is unremarkable with some hyperglycemia and a very mild leukocytosis of 11.4 but otherwise it is reassuring.  I Find no evidence of acute or emergent medical condition at this time.  I updated him about these findings and explained that I cannot prescribe narcotics for chronic pain.  I encouraged him to call his surgeon's office in the next couple of hours when they open and see if they can schedule a close outpatient follow-up visit.  I gave my usual and customary return precautions.   Clinical Course as of Dec 06 822  Tue Dec 05, 2016  4098 DG Abdomen Acute W/Chest [CF]    Clinical Course User Index [CF] Loleta Rose, MD    ____________________________________________  FINAL CLINICAL IMPRESSION(S) / ED DIAGNOSES  Final diagnoses:  Chronic abdominal pain     MEDICATIONS GIVEN DURING THIS VISIT:  Medications  ondansetron (ZOFRAN-ODT) disintegrating tablet 4 mg (4 mg Oral Given 12/05/16 0604)     NEW OUTPATIENT MEDICATIONS STARTED DURING THIS VISIT:  Discharge Medication List as of 12/05/2016  7:19 AM      Discharge Medication List as of 12/05/2016  7:19 AM      Discharge Medication List as of 12/05/2016  7:19 AM       Note:  This document was prepared using Dragon voice recognition software and may include unintentional dictation errors.    Loleta Rose, MD 12/05/16 [phone]

## 2016-12-05 NOTE — Discharge Instructions (Signed)
Please take a bland diet for the next several days to improve your diarrhea and see if this helps your pain. Please also make an appointment with your primary care physician and your pain doctor to make a long-term pain management plan.  Return to the emergency department if you develop severe pain, inability to keep down fluids, fever, changes in your ostomy output, or any other symptoms concerning to you.

## 2016-12-05 NOTE — ED Notes (Signed)
[facility] called to transport hom

## 2016-12-05 NOTE — ED Notes (Signed)
Pt discharged home in [employer] after verbalizing understanding of discharge instructions; nad noted. Pt to contact his wife to meet taxi when it arrives to bring his crutches.

## 2016-12-05 NOTE — ED Notes (Signed)
Patient transported to CT 

## 2016-12-05 NOTE — ED Triage Notes (Signed)
Pt to triage via w/c with no distress noted, brought in by EMS who reports pt seen for abd pain frequently recently; c/o persistent pain

## 2016-12-05 NOTE — ED Notes (Signed)
Pt given new disposable scrubs and changed.

## 2016-12-05 NOTE — ED Notes (Signed)
[place] care [place], [place]. Pt sleeping soundly upon this nurse's arrival to room. Pt requests EMS to take him home; after further investigation, pt has been sent by taxi before. States that he doesn't have his braces or crutches, but that we can take him out in wheelchair and place in taxi, and his wife, who is home and doesn't drive, can get his crutches for him when taxi arrives. Pt also requests change of clothing. NAD noted.

## 2016-12-05 NOTE — ED Provider Notes (Signed)
[facility] Emergency Department Provider Note  ____________________________________________  Time seen: Approximately 4:11 PM  I have reviewed the triage vital signs and the nursing notes.   HISTORY  Chief Complaint Abdominal Pain    HPI Cory Campbell is a 36 y.o. male a history of colostomy placed 2 years ago, chronic abdominal pain on Dilaudid, diabetes, HIV, presenting with abdominal pain. The patient was seen here less than 24 hours ago for similar complaints, with a reassuring workup including x-ray that did not show any evidence of obstruction, reassuring laboratory studies, and a reassuring clinical course. He states that he has had a chronic unchanged abdominal pain for the past 8 months. His pain is not any different today. He notes more watery output from his ostomy site. His pain is not improved with his home Dilaudid. He has had one episode of vomiting yesterday and one episode today. No fevers or chills. No urinary symptoms. No chest pain or shortness of breath.   Past Medical History:  Diagnosis Date  . Abrasion or friction burn of foot and toe(s), without mention of infection   . Acid reflux   . Adopted   . Allergic rhinitis, cause unspecified   . Anxiety   . Arthritis   . Arthrogryposis   . Asthma   . Bladder wall thickening   . BPH (benign prostatic hyperplasia)   . Chest pain, unspecified   . Chronic pain syndrome   . Depressive disorder, not elsewhere classified   . Diabetes mellitus without complication (HCC)   . Diverticulitis of colon (without mention of hemorrhage)(562.11)   . ED (erectile dysfunction)   . Herpes genitalis   . HIV infection (HCC)   . HTN (hypertension)   . Hyperlipidemia   . Hypogonadism in male   . Myalgia and myositis, unspecified   . OAB (overactive bladder)   . Obesity   . Other and unspecified noninfectious gastroenteritis and colitis(558.9)   . Other convulsions   . Other psoriasis   . Other  specified nonteratogenic anomalies(754.89)   . Premature baby    born premature  . Sleep apnea   . 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 or other cause, without mention of thyrotoxic crisis or storm    hyperthyroidism  . TIA (transient ischemic attack)   . Type II or unspecified type diabetes mellitus with neurological manifestations, not stated as uncontrolled(250.60)     Patient Active Problem List   Diagnosis Date Noted  . Abdominal pain 11/25/2016  . Hypotension 10/08/2016  . Unstable angina (HCC) 10/06/2016  . Shortness of breath 06/23/2015  . Obesity 05/18/2014  . Frequent falls 08/12/2013  . Pain of left clavicle 08/12/2013  . Tachycardia 01/21/2013  . Obstructive sleep apnea on CPAP 06/07/2012  . Chest pain 06/20/2011  . Uncontrolled hypertension 06/20/2011  . Diabetes mellitus (HCC) 06/20/2011    Past Surgical History:  Procedure Laterality Date  . APPENDECTOMY    . COLOSTOMY    . CORNEAL TRANSPLANT    . feet surgery     both  . HAND SURGERY     left and right  . leg surgery     left and right  . ORTHOPEDIC SURGERY     hands, feet, knees, legs  . tubes in ears     both    Current Outpatient Rx  . Order #: [id number] Class: Historical Med  . Order #: [id number] Class: Historical Med  . Order #: [id number] Class: Historical Med  . Order #: [id number] Class: Historical  Med  . Order #: [id number] Class: Historical Med  . Order #: [id number] Class: Historical Med  . Order #: [id number] Class: Historical Med  . Order #: [id number] Class: Historical Med  . Order #: [id number] Class: Historical Med  . Order #: [id number] Class: Historical Med  . Order #: [id number] Class: Historical Med  . Order #: [id number] Class: Historical Med  . Order #: [id number] Class: Historical Med  . Order #: [id number] Class: Normal  . Order #: [id number] Class: Historical Med  . Order #: [id number] Class: Print  . Order #: [id number] Class: Historical Med  . Order #: [id number] Class: Historical Med  . Order #:  [id number] Class: Historical Med  . Order #: [id number] Class: Historical Med  . Order #: [id number] Class: Historical Med  . Order #: [id number] Class: Historical Med  . Order #: [id number] Class: Historical Med  . Order #: [id number] Class: Historical Med  . Order #: [id number] Class: Historical Med  . Order #: [id number] Class: Historical Med  . Order #: [id number] Class: Normal  . Order #: [id number] Class: Normal  . Order #: [id number] Class: Historical Med  . Order #: [id number] Class: Historical Med  . Order #: [id number] Class: Historical Med  . Order #: [id number] Class: Historical Med  . Order #: [id number] Class: Historical Med  . Order #: [id number] Class: Historical Med  . Order #: [id number] Class: Historical Med  . Order #: [id number] Class: Historical Med  . Order #: [id number] Class: Historical Med  . Order #: [id number] Class: Historical Med  . Order #: [id number] Class: Normal  . Order #: [id number] Class: Normal  . Order #: [id number] Class: Historical Med  . Order #: [id number] Class: Historical Med  . Order #: [id number] Class: Historical Med  . Order #: [id number] Class: Historical Med    Allergies Penicillins; Erythromycin base; and Erythromycin base  Family History  Problem Relation Age of Onset  . Family history unknown: Yes    Social History Social History  Substance Use Topics  . Smoking status: Current Every Day Smoker    Years: 0.00    Types: Pipe  . Smokeless tobacco: Never Used     Comment: quit smoking friday 08/18/2016  . Alcohol use No     Comment: used to be moderate    Review of Systems Constitutional: No fever/chills. Lightheadedness or syncope. Eyes: No visual changes. ENT: No sore throat. No congestion or rhinorrhea. Cardiovascular: Denies chest pain. Denies palpitations. Respiratory: Denies shortness of breath.  No cough. Gastrointestinal: Positive abdominal pain.  Pain around ostomy site. Positive nausea, positive vomiting.  Positive diarrhea.  No  constipation. Genitourinary: Negative for dysuria. Musculoskeletal: Negative for back pain. Skin: Negative for rash. Neurological: Negative for headaches. No focal numbness, tingling or weakness.     ____________________________________________   PHYSICAL EXAM:  VITAL SIGNS: ED Triage Vitals  Enc Vitals Group     BP 12/05/16 1440 (!) 137/100     Pulse Rate 12/05/16 1440 (!) 108     Resp 12/05/16 1440 18     Temp 12/05/16 1440 98.3 F (36.8 C)     Temp Source 12/05/16 1440 Oral     SpO2 12/05/16 1440 100 %     Weight 12/05/16 1439 195 lb (88.5 kg)     Height 12/05/16 1439 5\' 5"  (1.651 m)     Head Circumference --      Peak Flow --      Pain Score 12/05/16 1440 10     Pain Loc --      Pain Edu? --      Excl. in GC? --     Constitutional: The patient is alert, makes good eye contact, and  answers questions appropriately. He is chronically ill-appearing and holding his left lower quadrant. He is nontoxic. Eyes: Conjunctivae are injected bilaterally.  EOMI. No scleral icterus. Head: Atraumatic. Nose: No congestion/rhinnorhea. Mouth/Throat: Mucous membranes are dry.  Neck: No stridor.  Supple.  No JVD. No meningismus. Cardiovascular: Fast rate, regular rhythm. No murmurs, rubs or gallops.  Respiratory: Normal respiratory effort.  No accessory muscle use or retractions. Lungs CTAB.  No wheezes, rales or ronchi. Gastrointestinal: The abdomen is soft and nondistended. He has a colostomy bag in the left lower quadrant with surrounding tenderness to palpation. The output is light brown with no evidence of blood. No guarding or rebound.  No peritoneal signs. Musculoskeletal: No LE edema.  Neurologic:  A&Ox3.  Speech is clear.  Face and smile are symmetric.  EOMI.  Moves all extremities well. Skin:  Skin is warm, dry and intact. No rash noted. Psychiatric: Mood and affect are normal.   ____________________________________________   LABS (all labs ordered are listed, but only  abnormal results are displayed)  Labs Reviewed  COMPREHENSIVE METABOLIC PANEL - Abnormal; Notable for the following:       Result Value   Chloride 100 (*)    Glucose, Bld 251 (*)    All other components within normal limits  URINALYSIS, COMPLETE (UACMP) WITH MICROSCOPIC - Abnormal; Notable for the following:    Color, Urine YELLOW (*)    APPearance HAZY (*)    Glucose, UA >=500 (*)    Leukocytes, UA SMALL (*)    Squamous Epithelial / LPF 0-5 (*)    All other components within normal limits  CBC  LIPASE, BLOOD  TROPONIN I   ____________________________________________  EKG  ED ECG REPORT I, Rockne MenghiniNorman, Anne-Caroline, the attending physician, personally viewed and interpreted this ECG.   Date: 12/05/2016  EKG Time: 1654  Rate: 102  Rhythm: sinus tachycardia  Axis: leftward  Intervals:none  ST&T Change: No STEMI  ____________________________________________  RADIOLOGY  Ct Abdomen Pelvis W Contrast  Result Date: 12/05/2016 CLINICAL DATA:  Abdominal pain around colostomy site. Vomiting and diarrhea from colostomy. History of appendectomy. Diverticulitis. HIV. Smoker. EXAM: CT ABDOMEN AND PELVIS WITH CONTRAST TECHNIQUE: Multidetector CT imaging of the abdomen and pelvis was performed using the standard protocol following bolus administration of intravenous contrast. CONTRAST:  100mL ISOVUE-300 IOPAMIDOL (ISOVUE-300) INJECTION 61% COMPARISON:  Plain films of earlier today.  CT of 10 days ago. FINDINGS: Lower chest: Clear lung bases. Normal heart size without pericardial or pleural effusion. Hepatobiliary: Normal liver. Multiple small gallstones without acute cholecystitis or biliary duct dilatation. Pancreas: Normal, without mass or ductal dilatation. Spleen: Normal in size, without focal abnormality. Adrenals/Urinary Tract: Normal adrenal glands. Normal kidneys, without hydronephrosis. Normal urinary bladder. Stomach/Bowel: Normal stomach, without wall thickening. Hartmann's pouch and  descending colostomy. No acute complication identified. Scattered colonic diverticula. Normal terminal ileum. Normal small bowel. Vascular/Lymphatic: Normal caliber of the aorta and branch vessels. No retroperitoneal or retrocrural adenopathy. Similar borderline right external iliac adenopathy on image 79/ series 2 1.0 cm. Favored to be reactive. Reproductive: Normal prostate. Other: No significant free fluid.  Pelvic floor laxity. 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 IMPRESSION: 1. 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 No acute complication. 2. Cholelithiasis. Electronically Signed   By: Jeronimo GreavesKyle  Talbot M.D.   On: 12/05/2016 18:23   Dg Abdomen Acute W/chest  Result Date: 12/05/2016 CLINICAL DATA:  Persistent abdominal pain at colostomy site. EXAM: DG ABDOMEN ACUTE W/ 1V CHEST COMPARISON:  CT 10  days prior 11/25/2016 FINDINGS: Low lung volumes. The cardiomediastinal contours are normal. The lungs are clear. There is no free intra-abdominal air. No dilated bowel loops to suggest obstruction. Small volume of stool throughout the colon. No radiopaque calculi. No acute osseous abnormalities are seen. Chronic deformity of both hips. IMPRESSION: 1. Normal bowel gas pattern.  No obstruction or free air. 2. Low lung volumes without acute abnormality. 3. Gallstones on recent CT are not well demonstrated radiographically. Electronically Signed   By: Rubye Oaks M.D.   On: 12/05/2016 05:40    ____________________________________________   PROCEDURES  Procedure(s) performed: None  Procedures  Critical Care performed: No ____________________________________________   INITIAL IMPRESSION / ASSESSMENT AND PLAN / ED COURSE  Pertinent labs & imaging results that were available during my care of the patient were reviewed by me and considered in my medical decision making (see chart for details).  36 y.o. male with a history of colostomy presenting for  the second time in less than 24 hours for chronic abdominal pain, now with liquid stool and diarrhea. The patient states that his pain is unchanged, but he does have tenderness on my examination on palpation. His x-ray last night was reassuring, but given his second presentation with ongoing pain, we'll do a CT scan for further evaluation and to r/o diverticulitis, complication of ostomy, obstruction.  I've spoken to the patient about treating his pain without narcotics, as he has multiple ED visits both here and at [facility] for this similar pain. He follows at the pain clinic, and has Dilaudid at home. I will also give him intravenous fluids as his mucous membranes are dry, and an antiemetic. Plan reevaluation after his CT scan.  I've reviewed the patient's medical chart.  ----------------------------------------- 6:44 PM on 12/05/2016 -----------------------------------------  The patient's workup in the emergency department has been reassuring. He has normal electrolytes without hypokalemia. He has hyperglycemia without DKA and this has been treated with intravenous fluids. His CT scan does not show any acute process. The patient's sinus tachycardia has completely resolved. At this time, the patient is safe for discharge home. I have encouraged him to see his primary care physician and his pain physician to make a long-term pain management plan.  ____________________________________________  FINAL CLINICAL IMPRESSION(S) / ED DIAGNOSES  Final diagnoses:  Left lower quadrant pain  Diarrhea, unspecified type  Hyperglycemia  Sinus tachycardia         NEW MEDICATIONS STARTED DURING THIS VISIT:  New Prescriptions   No medications on file      Rockne Menghini, MD 12/05/16 1845

## 2016-12-05 NOTE — ED Triage Notes (Signed)
Pt to ED via EMS from home c/o lower mid abd pain and n/v today.  Was seen in ER and discharged at 0800 this morning for the same.  Also states worried about colostomy bag. Pt has pain patch in place on left chest.

## 2016-12-05 NOTE — ED Notes (Signed)
Pt given diet ginger ale to drink as po challenge after administration of ODT Zofran 4 mg. Pt tolerated the drink without an episode of vomiting.

## 2016-12-05 NOTE — ED Notes (Signed)
Stoma pink, colostomy contents yellow.

## 2016-12-05 NOTE — ED Triage Notes (Signed)
Patient reports abdominal pain at colostomy site.  Also reports vomiting and diarrhea.

## 2016-12-05 NOTE — Discharge Instructions (Signed)

## 2016-12-28 ENCOUNTER — Emergency Department
Admission: EM | Admit: 2016-12-28 | Discharge: 2016-12-28 | Disposition: A | Discharge status: Home / Self Care | Payer: Medicaid Other | Attending: Emergency Medicine | Admitting: Emergency Medicine

## 2016-12-28 ENCOUNTER — Encounter: Payer: Self-pay | Admitting: Emergency Medicine

## 2016-12-28 DIAGNOSIS — F17210 Nicotine dependence, cigarettes, uncomplicated: Secondary | ICD-10-CM | POA: Insufficient documentation

## 2016-12-28 DIAGNOSIS — R350 Frequency of micturition: Secondary | ICD-10-CM | POA: Diagnosis present

## 2016-12-28 DIAGNOSIS — Z794 Long term (current) use of insulin: Secondary | ICD-10-CM | POA: Insufficient documentation

## 2016-12-28 DIAGNOSIS — R631 Polydipsia: Secondary | ICD-10-CM | POA: Diagnosis not present

## 2016-12-28 DIAGNOSIS — E1165 Type 2 diabetes mellitus with hyperglycemia: Secondary | ICD-10-CM | POA: Diagnosis not present

## 2016-12-28 DIAGNOSIS — Z8673 Personal history of transient ischemic attack (TIA), and cerebral infarction without residual deficits: Secondary | ICD-10-CM | POA: Insufficient documentation

## 2016-12-28 DIAGNOSIS — I10 Essential (primary) hypertension: Secondary | ICD-10-CM | POA: Diagnosis not present

## 2016-12-28 DIAGNOSIS — R739 Hyperglycemia, unspecified: Secondary | ICD-10-CM

## 2016-12-28 DIAGNOSIS — J45909 Unspecified asthma, uncomplicated: Secondary | ICD-10-CM | POA: Insufficient documentation

## 2016-12-28 DIAGNOSIS — Z79899 Other long term (current) drug therapy: Secondary | ICD-10-CM | POA: Insufficient documentation

## 2016-12-28 DIAGNOSIS — B20 Human immunodeficiency virus [HIV] disease: Secondary | ICD-10-CM | POA: Diagnosis not present

## 2016-12-28 DIAGNOSIS — Z7982 Long term (current) use of aspirin: Secondary | ICD-10-CM | POA: Diagnosis not present

## 2016-12-28 LAB — URINALYSIS, COMPLETE (UACMP) WITH MICROSCOPIC
Bacteria, UA: NONE SEEN
Bilirubin Urine: NEGATIVE
Glucose, UA: >=500 mg/dL — AB
Hgb urine dipstick: NEGATIVE
Ketones, ur: 5 mg/dL — AB
Leukocytes, UA: NEGATIVE
Nitrite: NEGATIVE
Protein, ur: NEGATIVE mg/dL
Specific Gravity, Urine: 1.027 (ref 1.005–1.030)
pH: 5 (ref 5.0–8.0)

## 2016-12-28 LAB — BASIC METABOLIC PANEL WITH GFR
Anion gap: 14 (ref 5–15)
BUN: 20 mg/dL (ref 6–20)
CO2: 25 mmol/L (ref 22–32)
Calcium: 10 mg/dL (ref 8.9–10.3)
Chloride: 96 mmol/L — ABNORMAL LOW (ref 101–111)
Creatinine, Ser: 0.72 mg/dL (ref 0.61–1.24)
GFR calc Af Amer: >60 mL/min
GFR calc non Af Amer: >60 mL/min
Glucose, Bld: 356 mg/dL — ABNORMAL HIGH (ref 65–99)
Potassium: 4.1 mmol/L (ref 3.5–5.1)
Sodium: 135 mmol/L (ref 135–145)

## 2016-12-28 LAB — GLUCOSE, CAPILLARY
GLUCOSE-CAPILLARY: 325 mg/dL — AB (ref 65–99)
Glucose-Capillary: 287 mg/dL — ABNORMAL HIGH (ref 65–99)

## 2016-12-28 LAB — CBC WITH DIFFERENTIAL/PLATELET
Basophils Absolute: 0.1 10*3/uL (ref 0–0.1)
Basophils Relative: 1 %
EOS PCT: 21 %
Eosinophils Absolute: 1.6 10*3/uL — ABNORMAL HIGH (ref 0–0.7)
HCT: 45.2 % (ref 40.0–52.0)
Hemoglobin: 15 g/dL (ref 13.0–18.0)
LYMPHS ABS: 1.4 10*3/uL (ref 1.0–3.6)
LYMPHS PCT: 19 %
MCH: 27.7 pg (ref 26.0–34.0)
MCHC: 33.2 g/dL (ref 32.0–36.0)
MCV: 83.4 fL (ref 80.0–100.0)
MONO ABS: 0.5 10*3/uL (ref 0.2–1.0)
Monocytes Relative: 6 %
Neutro Abs: 4 10*3/uL (ref 1.4–6.5)
Neutrophils Relative %: 53 %
PLATELETS: 273 10*3/uL (ref 150–440)
RBC: 5.42 MIL/uL (ref 4.40–5.90)
RDW: 14 % (ref 11.5–14.5)
WBC: 7.6 10*3/uL (ref 3.8–10.6)

## 2016-12-28 LAB — HEPATIC FUNCTION PANEL
ALBUMIN: 4 g/dL (ref 3.5–5.0)
ALK PHOS: 69 U/L (ref 38–126)
ALT: 20 U/L (ref 17–63)
AST: 23 U/L (ref 15–41)
BILIRUBIN TOTAL: 0.5 mg/dL (ref 0.3–1.2)
Bilirubin, Direct: <0.1 mg/dL — ABNORMAL LOW (ref 0.1–0.5)
TOTAL PROTEIN: 7.4 g/dL (ref 6.5–8.1)

## 2016-12-28 LAB — BETA-HYDROXYBUTYRIC ACID: Beta-Hydroxybutyric Acid: 0.19 mmol/L (ref 0.05–0.27)

## 2016-12-28 MED ORDER — SODIUM CHLORIDE 0.9 % IV BOLUS (SEPSIS)
1000.0000 mL | Freq: Once | INTRAVENOUS | Status: AC
  Administered 2016-12-28: 1000 mL via INTRAVENOUS

## 2016-12-28 MED ORDER — ONDANSETRON HCL 4 MG/2ML IJ SOLN
4.0000 mg | Freq: Once | INTRAMUSCULAR | Status: AC
  Administered 2016-12-28: 4 mg via INTRAVENOUS
  Filled 2016-12-28: qty 2

## 2016-12-28 NOTE — ED Notes (Signed)
First Nurse note: EMS brought pt in with complaints of high blood sugar. He has an 18 gauge in his RA. Blood sugar was reading 493 for EMS they gave him of fluid. Pt NAD at this time.

## 2016-12-28 NOTE — ED Provider Notes (Signed)
[facility] Emergency Department Provider Note  ____________________________________________   First MD Initiated Contact with Patient 12/28/16 2141     (approximate)  I have reviewed the triage vital signs and the nursing notes.   HISTORY  Chief Complaint Hyperglycemia   HPI Cory Campbell is a 36 y.o. male who self presents to the emergency department with elevated blood sugar.  He has a past medical history of insulin-dependent diabetes mellitus with a last known hemoglobin A1c of 9.1.  He does report some polyuria and polydipsia.  He has had no fevers or chills.  No chest pain or shortness of breath.  He reports compliance with his medications.  He checked his blood sugar today and it was high so he came to the emergency department to have it checked.  His symptoms began insidiously and have been constant.  They seem to be worsened with medication and food noncompliance and improved when he is compliant.  Past Medical History:  Diagnosis Date  . Abrasion or friction burn of foot and toe(s), without mention of infection   . Acid reflux   . Adopted   . Allergic rhinitis, cause unspecified   . Anxiety   . Arthritis   . Arthrogryposis   . Asthma   . Bladder wall thickening   . BPH (benign prostatic hyperplasia)   . Chest pain, unspecified   . Chronic pain syndrome   . Depressive disorder, not elsewhere classified   . Diabetes mellitus without complication (HCC)   . Diverticulitis of colon (without mention of hemorrhage)(562.11)   . ED (erectile dysfunction)   . Herpes genitalis   . HIV infection (HCC)   . HTN (hypertension)   . Hyperlipidemia   . Hypogonadism in male   . Myalgia and myositis, unspecified   . OAB (overactive bladder)   . Obesity   . Other and unspecified noninfectious gastroenteritis and colitis(558.9)   . Other convulsions   . Other psoriasis   . 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 Premature baby    born  premature  . Sleep apnea   . 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 or other cause, without mention of thyrotoxic crisis or storm    hyperthyroidism  . TIA (transient ischemic attack)   . Type II or unspecified type diabetes mellitus with neurological manifestations, not stated as uncontrolled(250.60)     Patient Active Problem List   Diagnosis Date Noted  . Abdominal pain 11/25/2016  . Hypotension 10/08/2016  . Unstable angina (HCC) 10/06/2016  . Shortness of breath 06/23/2015  . Obesity 05/18/2014  . Frequent falls 08/12/2013  . Pain of left clavicle 08/12/2013  . Tachycardia 01/21/2013  . Obstructive sleep apnea on CPAP 06/07/2012  . Chest pain 06/20/2011  . Uncontrolled hypertension 06/20/2011  . Diabetes mellitus (HCC) 06/20/2011    Past Surgical History:  Procedure Laterality Date  . APPENDECTOMY    . COLOSTOMY    . CORNEAL TRANSPLANT    . feet surgery     both  . HAND SURGERY     left and right  . leg surgery     left and right  . ORTHOPEDIC SURGERY     hands, feet, knees, legs  . tubes in ears     both    Prior to Admission medications   Medication Sig Start Date End Date Taking? Authorizing Provider  acyclovir (ZOVIRAX) 400 MG tablet Take 400 mg by mouth 2 (two) times daily.    [provider]  albuterol (PROVENTIL HFA;VENTOLIN HFA) 108 (90 BASE) MCG/ACT inhaler Inhale 2 puffs into the lungs every 4 (four) hours as needed for wheezing or shortness of breath.     [provider]  albuterol (PROVENTIL) (2.5 MG/3ML) 0.083% nebulizer solution Take 2.5 mg by nebulization every 4 (four) hours as needed for wheezing or shortness of breath.    [provider]  alfuzosin (UROXATRAL) 10 MG 24 hr tablet Take 10 mg by mouth daily.    [provider]  amLODipine (NORVASC) 10 MG tablet Take 10 mg by mouth daily.    [provider]  aspirin 81 MG tablet Take 81 mg by mouth daily.    [provider]  BIKTARVY 50-200-25  MG TABS tablet Take 1 tablet by mouth daily. 11/21/16   [provider]  Buprenorphine (BUTRANS) 15 MCG/HR PTWK Place 15 mg onto the skin once a week. Saturday    [provider]  buPROPion (WELLBUTRIN XL) 150 MG 24 hr tablet Take 1 tablet by mouth daily. 11/16/16   [provider]  chlorthalidone (HYGROTON) 25 MG tablet Take 1 tablet by mouth daily. 11/15/16   [provider]  citalopram (CELEXA) 20 MG tablet Take 20 mg by mouth daily.    [provider]  cloNIDine (CATAPRES - DOSED IN MG/24 HR) 0.1 mg/24hr patch Place 0.1 mg onto the skin once a week.    [provider]  diclofenac sodium (VOLTAREN) 1 % GEL Apply 2-4 g topically 4 (four) times daily as needed. For pain.    [provider]  dicyclomine (BENTYL) 20 MG tablet Take 20 mg by mouth every 6 (six) hours.    [provider]  DOCOSAHEXAENOIC ACID PO Take 1 tablet by mouth daily.     [provider]  famotidine (PEPCID) 20 MG tablet Take 20 mg by mouth 2 (two) times daily.  01/03/16   [provider]  feeding supplement, ENSURE ENLIVE, (ENSURE ENLIVE) LIQD Take 237 mLs by mouth 2 (two) times daily between meals. 10/10/16   Altamese DillingVachhani, Vaibhavkumar, MD  finasteride (PROSCAR) 5 MG tablet Take 5 mg by mouth daily.    [provider]  fluticasone (FLONASE) 50 MCG/ACT nasal spray Place 2 sprays into the nose daily.    [provider]  hydrocortisone cream 0.5 % Apply 1 application topically 2 (two) times daily.    [provider]  HYDROmorphone (DILAUDID) 4 MG tablet Take 1 tablet (4 mg total) by mouth every 3 (three) hours as needed. Patient taking differently: Take 4 mg by mouth 2 (two) times daily as needed.  02/26/16   Willy Eddyobinson, Patrick, MD  insulin degludec (TRESIBA FLEXTOUCH) 100 UNIT/ML SOPN FlexTouch Pen Inject 50 Units into the skin daily at 10 pm.     [provider]  ketoconazole (NIZORAL) 2 % shampoo Apply 1  application topically 2 (two) times a week. tues and thursday 08/27/14   [provider]  lisinopril (PRINIVIL,ZESTRIL) 40 MG tablet Take 40 mg by mouth daily.    [provider]  loratadine (CLARITIN) 10 MG tablet Take 10 mg by mouth daily.    [provider]  metoprolol tartrate (LOPRESSOR) 100 MG tablet Take 100 mg by mouth 2 (two) times daily.    [provider]  mirtazapine (REMERON) 45 MG tablet Take 45 mg by mouth at bedtime.    [provider]  Multiple Vitamin (MULTIVITAMIN) tablet Take 1 tablet by mouth daily.    [provider]  naloxone (NARCAN) nasal spray 4 mg/0.1 mL One spray in either nostril once for known/suspected opioid overdose. May repeat every 2-3 minutes in alternating nostril til EMS arrives 01/12/16   [provider]  nitroGLYCERIN (NITROSTAT) 0.4 MG SL tablet Place 1 tablet (0.4 mg total) under the tongue every 5 (five) minutes as needed for chest pain. Call 911 if chest is not resolved w/ 3 tabs. Patient not taking: Reported on 12/05/2016 11/02/14   Antonieta Iba, MD  nortriptyline (PAMELOR) 25 MG capsule Take 50 mg by mouth at bedtime.    [provider]  NOVOLOG FLEXPEN 100 UNIT/ML FlexPen Inject 10 Units into the skin 3 (three) times daily with meals. With every meal and every snack 12/13/15   [provider]  nystatin (MYCOSTATIN) powder Apply topically 3 (three) times daily. For 10 days Patient not taking: Reported on 11/25/2016 05/19/15   Renford Dills, NP  nystatin-triamcinolone [facility] II) cream Apply 1 application topically 2 (two) times daily.    [provider]  omeprazole (PRILOSEC) 20 MG capsule Take 20 mg by mouth daily.    [provider]  ondansetron (ZOFRAN-ODT) 4 MG disintegrating tablet Take 4 mg by mouth every 8 (eight) hours as needed for nausea or vomiting.    [provider]  polyethylene glycol (MIRALAX / GLYCOLAX) packet Take 17 g by mouth  3 (three) times daily.     [provider]  potassium chloride SA (K-DUR,KLOR-CON) 20 MEQ tablet take 1 tablet by mouth once daily if needed Patient taking differently: take 1 tablet by mouth once daily 02/08/16   Antonieta Iba, MD  pravastatin (PRAVACHOL) 80 MG tablet take 1 tablet by mouth at bedtime 10/10/16   Antonieta Iba, MD  prednisoLONE acetate (PRED FORTE) 1 % ophthalmic suspension Place 1 drop into the right eye daily.     [provider]  RA MELATONIN 1 MG SUBL Take 1 mg by mouth at bedtime.  12/27/15   [provider]  solifenacin (VESICARE) 10 MG tablet Take 10 mg by mouth daily.    [provider]  tiZANidine (ZANAFLEX) 4 MG capsule Take 4 mg by mouth 3 (three) times daily as needed.  01/12/16 01/11/17  [provider]  zolpidem (AMBIEN) 10 MG tablet Take 10 mg by mouth at bedtime.    [provider]    Allergies Penicillins; Erythromycin base; and Erythromycin base  Family History  Family history unknown: Yes    Social History Social History   Tobacco Use  . Smoking status: Current Every Day Smoker    Years: 0.00    Types: Pipe  . Smokeless tobacco: Never Used  . Tobacco comment: quit smoking friday 08/18/2016  Substance Use Topics  . Alcohol use: No    Alcohol/week: 0.0 oz    Comment: used to be moderate  . Drug use: No    Review of Systems Constitutional: No fever/chills Eyes: No visual changes. ENT: No sore throat. Cardiovascular: Denies chest pain. Respiratory: Denies shortness of breath. Gastrointestinal: No abdominal pain.  No nausea, no vomiting.  No diarrhea.  No constipation. Genitourinary: Negative for dysuria. Musculoskeletal: Negative for back pain. Skin: Negative for rash. Neurological: Negative for headaches, focal weakness or numbness.   ____________________________________________   PHYSICAL EXAM:  VITAL SIGNS: ED Triage Vitals  Enc Vitals Group     BP 12/28/16 1955 (!) 134/95      Pulse Rate 12/28/16 1955 (!) 117     Resp 12/28/16 1955 18  Temp 12/28/16 1955 98.7 F (37.1 C)     Temp Source 12/28/16 1955 Oral     SpO2 12/28/16 1955 100 %     Weight --      Height --      Head Circumference --      Peak Flow --      Pain Score 12/28/16 1954 8     Pain Loc --      Pain Edu? --      Excl. in GC? --     Constitutional: Alert and oriented x4 pleasant cooperative speaks in full clear sentences no diaphoresis Eyes: PERRL EOMI. Head: Atraumatic. Nose: No congestion/rhinnorhea. Mouth/Throat: No trismus Neck: No stridor.   Cardiovascular: Normal rate, regular rhythm. Grossly normal heart sounds.  Good peripheral circulation. Respiratory: Normal respiratory effort.  No retractions. Lungs CTAB and moving good air Gastrointestinal: Soft nontender stoma in place to left lower quadrant pink patent productive Musculoskeletal: No lower extremity edema   Neurologic:  Normal speech and language.  Moves all 4 extremities Skin:  Skin is warm, dry and intact. No rash noted. Psychiatric: Mood and affect are normal. Speech and behavior are normal.    ____________________________________________   DIFFERENTIAL includes but not limited to  Asymptomatic hyperglycemia, DKA, HHS, metabolic arrangement ____________________________________________   LABS (all labs ordered are listed, but only abnormal results are displayed)  Labs Reviewed  BLOOD GAS, VENOUS - Abnormal; Notable for the following components:      Result Value   Bicarbonate 31.1 (*)    Acid-Base Excess 4.0 (*)    All other components within normal limits  BASIC METABOLIC PANEL - Abnormal; Notable for the following components:   Chloride 96 (*)    Glucose, Bld 356 (*)    All other components within normal limits  HEPATIC FUNCTION PANEL - Abnormal; Notable for the following components:   Bilirubin, Direct <0.1 (*)    All other components within normal limits  CBC WITH DIFFERENTIAL/PLATELET -  Abnormal; Notable for the following components:   Eosinophils Absolute 1.6 (*)    All other components within normal limits  URINALYSIS, COMPLETE (UACMP) WITH MICROSCOPIC - Abnormal; Notable for the following components:   Color, Urine YELLOW (*)    APPearance CLEAR (*)    Glucose, UA >=500 (*)    Ketones, ur 5 (*)    Squamous Epithelial / LPF 0-5 (*)    All other components within normal limits  GLUCOSE, CAPILLARY - Abnormal; Notable for the following components:   Glucose-Capillary 325 (*)    All other components within normal limits  GLUCOSE, CAPILLARY - Abnormal; Notable for the following components:   Glucose-Capillary 287 (*)    All other components within normal limits  BETA-HYDROXYBUTYRIC ACID    Blood work reviewed by me shows elevated glucose but normal anion gap, normal pH, normal beta hydroxybutyric acid and no evidence of diabetic ketoacidosis __________________________________________  EKG   ____________________________________________  RADIOLOGY   ____________________________________________   PROCEDURES  Procedure(s) performed: no  Procedures  Critical Care performed: no  Observation: no ____________________________________________   INITIAL IMPRESSION / ASSESSMENT AND PLAN / ED COURSE  Pertinent labs & imaging results that were available during my care of the patient were reviewed by me and considered in my medical decision making (see chart for details).  The time I saw the patient he is already had blood work and a liter of fluids which significantly improved his symptoms.  I had a lengthy discussion with the patient regarding  his hyperglycemia and that a hemoglobin A1c of 9.1 is extremely dangerous in the long-term.  Fortunately he has no acute medical issues that require inpatient admission at this point.  He is discharged home with strict return precautions and follow-up with his endocrinologist this coming week.  He verbalized understanding and  agreed with the plan.      ____________________________________________   FINAL CLINICAL IMPRESSION(S) / ED DIAGNOSES  Final diagnoses:  Hyperglycemia      NEW MEDICATIONS STARTED DURING THIS VISIT:  This SmartLink is deprecated. Use AVSMEDLIST instead to display the medication list for a patient.   Note:  This document was prepared using Dragon voice recognition software and may include unintentional dictation errors.     Merrily Brittleifenbark, Berneita Sanagustin, MD 12/29/16 1112

## 2016-12-28 NOTE — Discharge Instructions (Signed)
Fortunately today your blood work was reassuring and you were not in diabetic ketoacidosis.  Please continue taking all your medications as prescribed and follow-up with your primary care physician this coming week for reevaluation.  Return to the emergency department sooner for any concerns whatsoever.  It was a pleasure to take care of you today, and thank you for coming to our emergency department.  If you have any questions or concerns before leaving please ask the nurse to grab me and I'm more than happy to go through your aftercare instructions again.  If you were prescribed any opioid pain medication today such as Norco, Vicodin, Percocet, morphine, hydrocodone, or oxycodone please make sure you do not drive when you are taking this medication as it can alter your ability to drive safely.  If you have any concerns once you are home that you are not improving or are in fact getting worse before you can make it to your follow-up appointment, please do not hesitate to call 911 and come back for further evaluation.  Merrily BrittleNeil Haylin Camilli, MD  Results for orders placed or performed during the hospital encounter of 12/28/16  Blood gas, venous  Result Value Ref Range   pH, Ven 7.36 7.250 - 7.430   pCO2, Ven 55 44.0 - 60.0 mmHg   pO2, Ven PENDING 32.0 - 45.0 mmHg   Bicarbonate 31.1 (H) 20.0 - 28.0 mmol/L   Acid-Base Excess 4.0 (H) 0.0 - 2.0 mmol/L   O2 Saturation PENDING %   Patient temperature 37.0    Collection site VENOUS    Sample type VENOUS   Basic metabolic panel  Result Value Ref Range   Sodium 135 135 - 145 mmol/L   Potassium 4.1 3.5 - 5.1 mmol/L   Chloride 96 (L) 101 - 111 mmol/L   CO2 25 22 - 32 mmol/L   Glucose, Bld 356 (H) 65 - 99 mg/dL   BUN 20 6 - 20 mg/dL   Creatinine, Ser 0.450.72 0.61 - 1.24 mg/dL   Calcium 40.910.0 8.9 - 81.110.3 mg/dL   GFR calc non Af Amer >60 >60 mL/min   GFR calc Af Amer >60 >60 mL/min   Anion gap 14 5 - 15  Hepatic function panel  Result Value Ref Range   Total Protein 7.4 6.5 - 8.1 g/dL   Albumin 4.0 3.5 - 5.0 g/dL   AST 23 15 - 41 U/L   ALT 20 17 - 63 U/L   Alkaline Phosphatase 69 38 - 126 U/L   Total Bilirubin 0.5 0.3 - 1.2 mg/dL   Bilirubin, Direct <9.1<0.1 (L) 0.1 - 0.5 mg/dL   Indirect Bilirubin NOT CALCULATED 0.3 - 0.9 mg/dL  CBC with Differential  Result Value Ref Range   WBC 7.6 3.8 - 10.6 K/uL   RBC 5.42 4.40 - 5.90 MIL/uL   Hemoglobin 15.0 13.0 - 18.0 g/dL   HCT 47.845.2 29.540.0 - 62.152.0 %   MCV 83.4 80.0 - 100.0 fL   MCH 27.7 26.0 - 34.0 pg   MCHC 33.2 32.0 - 36.0 g/dL   RDW 30.814.0 65.711.5 - 84.614.5 %   Platelets 273 150 - 440 K/uL   Neutrophils Relative % 53 %   Neutro Abs 4.0 1.4 - 6.5 K/uL   Lymphocytes Relative 19 %   Lymphs Abs 1.4 1.0 - 3.6 K/uL   Monocytes Relative 6 %   Monocytes Absolute 0.5 0.2 - 1.0 K/uL   Eosinophils Relative 21 %   Eosinophils Absolute 1.6 (H) 0 - 0.7  K/uL   Basophils Relative 1 %   Basophils Absolute 0.1 0 - 0.1 K/uL  Urinalysis, Complete w Microscopic  Result Value Ref Range   Color, Urine YELLOW (A) YELLOW   APPearance CLEAR (A) CLEAR   Specific Gravity, Urine 1.027 1.005 - 1.030   pH 5.0 5.0 - 8.0   Glucose, UA >=500 (A) NEGATIVE mg/dL   Hgb urine dipstick NEGATIVE NEGATIVE   Bilirubin Urine NEGATIVE NEGATIVE   Ketones, ur 5 (A) NEGATIVE mg/dL   Protein, ur NEGATIVE NEGATIVE mg/dL   Nitrite NEGATIVE NEGATIVE   Leukocytes, UA NEGATIVE NEGATIVE   RBC / HPF 0-5 0 - 5 RBC/hpf   WBC, UA 0-5 0 - 5 WBC/hpf   Bacteria, UA NONE SEEN NONE SEEN   Squamous Epithelial / LPF 0-5 (A) NONE SEEN   Mucus PRESENT   Beta-hydroxybutyric acid  Result Value Ref Range   Beta-Hydroxybutyric Acid 0.19 0.05 - 0.27 mmol/L  Glucose, capillary  Result Value Ref Range   Glucose-Capillary 325 (H) 65 - 99 mg/dL   Comment 1 Notify RN    Comment 2 Document in Chart    Ct Abdomen Pelvis W Contrast  Result Date: 12/05/2016 CLINICAL DATA:  Abdominal pain around colostomy site. Vomiting and diarrhea from colostomy. History  of appendectomy. Diverticulitis. HIV. Smoker. EXAM: CT ABDOMEN AND PELVIS WITH CONTRAST TECHNIQUE: Multidetector CT imaging of the abdomen and pelvis was performed using the standard protocol following bolus administration of intravenous contrast. CONTRAST:  100mL ISOVUE-300 IOPAMIDOL (ISOVUE-300) INJECTION 61% COMPARISON:  Plain films of earlier today.  CT of 10 days ago. FINDINGS: Lower chest: Clear lung bases. Normal heart size without pericardial or pleural effusion. Hepatobiliary: Normal liver. Multiple small gallstones without acute cholecystitis or biliary duct dilatation. Pancreas: Normal, without mass or ductal dilatation. Spleen: Normal in size, without focal abnormality. Adrenals/Urinary Tract: Normal adrenal glands. Normal kidneys, without hydronephrosis. Normal urinary bladder. Stomach/Bowel: Normal stomach, without wall thickening. Hartmann's pouch and descending colostomy. No acute complication identified. Scattered colonic diverticula. Normal terminal ileum. Normal small bowel. Vascular/Lymphatic: Normal caliber of the aorta and branch vessels. No retroperitoneal or retrocrural adenopathy. Similar borderline right external iliac adenopathy on image 79/ series 2 1.0 cm. Favored to be reactive. Reproductive: Normal prostate. Other: No significant free fluid.  Pelvic floor laxity. 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 IMPRESSION: 1. 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 No acute complication. 2. Cholelithiasis. Electronically Signed   By: Jeronimo GreavesKyle  Talbot M.D.   On: 12/05/2016 18:23   Dg Abdomen Acute W/chest  Result Date: 12/05/2016 CLINICAL DATA:  Persistent abdominal pain at colostomy site. EXAM: DG ABDOMEN ACUTE W/ 1V CHEST COMPARISON:  CT 10 days prior 11/25/2016 FINDINGS: Low lung volumes. The cardiomediastinal contours are normal. The lungs are clear. There is no free intra-abdominal air. No dilated bowel loops to suggest obstruction. Small  volume of stool throughout the colon. No radiopaque calculi. No acute osseous abnormalities are seen. Chronic deformity of both hips. IMPRESSION: 1. Normal bowel gas pattern.  No obstruction or free air. 2. Low lung volumes without acute abnormality. 3. Gallstones on recent CT are not well demonstrated radiographically. Electronically Signed   By: Rubye OaksMelanie  Ehinger M.D.   On: 12/05/2016 05:40

## 2016-12-30 LAB — BLOOD GAS, VENOUS
ACID-BASE EXCESS: 4 mmol/L — AB (ref 0.0–2.0)
BICARBONATE: 31.1 mmol/L — AB (ref 20.0–28.0)
PCO2 VEN: 55 mmHg (ref 44.0–60.0)
Patient temperature: 37
pH, Ven: 7.36 (ref 7.250–7.430)

## 2017-01-04 ENCOUNTER — Emergency Department
Admission: EM | Admit: 2017-01-04 | Discharge: 2017-01-04 | Disposition: A | Discharge status: Home / Self Care | Payer: Medicaid Other | Attending: Emergency Medicine | Admitting: Emergency Medicine

## 2017-01-04 ENCOUNTER — Other Ambulatory Visit: Payer: Self-pay

## 2017-01-04 ENCOUNTER — Encounter: Payer: Self-pay | Admitting: *Deleted

## 2017-01-04 ENCOUNTER — Emergency Department: Payer: Medicaid Other

## 2017-01-04 DIAGNOSIS — E1165 Type 2 diabetes mellitus with hyperglycemia: Secondary | ICD-10-CM | POA: Diagnosis not present

## 2017-01-04 DIAGNOSIS — R51 Headache: Secondary | ICD-10-CM | POA: Diagnosis not present

## 2017-01-04 DIAGNOSIS — R079 Chest pain, unspecified: Secondary | ICD-10-CM | POA: Insufficient documentation

## 2017-01-04 DIAGNOSIS — Z8673 Personal history of transient ischemic attack (TIA), and cerebral infarction without residual deficits: Secondary | ICD-10-CM | POA: Diagnosis not present

## 2017-01-04 DIAGNOSIS — I10 Essential (primary) hypertension: Secondary | ICD-10-CM | POA: Insufficient documentation

## 2017-01-04 DIAGNOSIS — R519 Headache, unspecified: Secondary | ICD-10-CM

## 2017-01-04 DIAGNOSIS — R739 Hyperglycemia, unspecified: Secondary | ICD-10-CM

## 2017-01-04 DIAGNOSIS — F17210 Nicotine dependence, cigarettes, uncomplicated: Secondary | ICD-10-CM | POA: Insufficient documentation

## 2017-01-04 LAB — BASIC METABOLIC PANEL
ANION GAP: 13 (ref 5–15)
BUN: 18 mg/dL (ref 6–20)
CO2: 28 mmol/L (ref 22–32)
Calcium: 9.7 mg/dL (ref 8.9–10.3)
Chloride: 95 mmol/L — ABNORMAL LOW (ref 101–111)
Creatinine, Ser: 0.8 mg/dL (ref 0.61–1.24)
GFR calc non Af Amer: >60 mL/min (ref >60)
Glucose, Bld: 311 mg/dL — ABNORMAL HIGH (ref 65–99)
POTASSIUM: 4.3 mmol/L (ref 3.5–5.1)
SODIUM: 136 mmol/L (ref 135–145)

## 2017-01-04 LAB — TROPONIN I
Troponin I: <0.03 ng/mL (ref <0.03)
Troponin I: <0.03 ng/mL (ref <0.03)

## 2017-01-04 LAB — CBC
HEMATOCRIT: 46.2 % (ref 40.0–52.0)
Hemoglobin: 15.4 g/dL (ref 13.0–18.0)
MCH: 27.7 pg (ref 26.0–34.0)
MCHC: 33.4 g/dL (ref 32.0–36.0)
MCV: 82.8 fL (ref 80.0–100.0)
Platelets: 277 10*3/uL (ref 150–440)
RBC: 5.57 MIL/uL (ref 4.40–5.90)
RDW: 13.8 % (ref 11.5–14.5)
WBC: 7.4 10*3/uL (ref 3.8–10.6)

## 2017-01-04 MED ORDER — SODIUM CHLORIDE 0.9 % IV BOLUS (SEPSIS)
1000.0000 mL | Freq: Once | INTRAVENOUS | Status: AC
  Administered 2017-01-04: 1000 mL via INTRAVENOUS

## 2017-01-04 MED ORDER — NITROGLYCERIN 0.4 MG SL SUBL
0.4000 mg | SUBLINGUAL_TABLET | SUBLINGUAL | Status: DC | PRN
  Administered 2017-01-04: 0.4 mg via SUBLINGUAL
  Filled 2017-01-04: qty 1

## 2017-01-04 MED ORDER — METOPROLOL TARTRATE 5 MG/5ML IV SOLN
10.0000 mg | Freq: Once | INTRAVENOUS | Status: AC
  Administered 2017-01-04: 10 mg via INTRAVENOUS
  Filled 2017-01-04: qty 10

## 2017-01-04 MED ORDER — ASPIRIN 81 MG PO CHEW: 324.0000 mg | CHEWABLE_TABLET | Freq: Once | ORAL | Status: DC

## 2017-01-04 MED ORDER — ACETAMINOPHEN 325 MG PO TABS
650.0000 mg | ORAL_TABLET | Freq: Once | ORAL | Status: AC
  Administered 2017-01-04: 650 mg via ORAL
  Filled 2017-01-04: qty 2

## 2017-01-04 NOTE — ED Notes (Signed)
Pt sleeping   Easily aroused.   

## 2017-01-04 NOTE — ED Notes (Signed)
[facility] called for transport home 

## 2017-01-04 NOTE — Discharge Instructions (Addendum)
Please return to the emergency department for severe pain, shortness of breath, fever or any other symptoms concerning to you.  Please call Dr. Windell HummingbirdGollan's office to schedule an outpatient appointment for further evaluation.

## 2017-01-04 NOTE — ED Triage Notes (Signed)
Pt brought in via ems from home with chest pain since 1530 today. Pt also reports osb  cig smoker.  No cough.  Pt alert.

## 2017-01-04 NOTE — ED Provider Notes (Addendum)
[facility] Emergency Department Provider Note  ____________________________________________  Time seen: Approximately 5:00 PM  I have reviewed the triage vital signs and the nursing notes.   HISTORY  Chief Complaint Chest Pain    HPI Cory Campbell is a 36 y.o. male with a history of multiple congenital abnormalities, HTN, HL, HIV, DM, presenting with hyperglycemia, chest pain, and headache.  The patient reports that for the past several weeks" my sugar has been out of control."  This morning, he went to the dentist and upon returning home he took a nap.  When he woke up he experienced a central chest pain radiating to the left shoulder which has been stable since 3 PM.  This was not associated with shortness of breath, nausea or vomiting, palpitations, lightheadedness or syncope.  In addition, he has some mild headache without any visual changes, speech changes, numbness feeling or weakness.  Past Medical History:  Diagnosis Date  . Abrasion or friction burn of foot and toe(s), without mention of infection   . Acid reflux   . Adopted   . Allergic rhinitis, cause unspecified   . Anxiety   . Arthritis   . Arthrogryposis   . Asthma   . Bladder wall thickening   . BPH (benign prostatic hyperplasia)   . Chest pain, unspecified   . Chronic pain syndrome   . Depressive disorder, not elsewhere classified   . Diabetes mellitus without complication (HCC)   . Diverticulitis of colon (without mention of hemorrhage)(562.11)   . ED (erectile dysfunction)   . Herpes genitalis   . HIV infection (HCC)   . HTN (hypertension)   . Hyperlipidemia   . Hypogonadism in male   . Myalgia and myositis, unspecified   . OAB (overactive bladder)   . Obesity   . Other and unspecified noninfectious gastroenteritis and colitis(558.9)   . Other convulsions   . Other psoriasis   . 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 Premature baby    born premature  . Sleep  apnea   . 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 or other cause, without mention of thyrotoxic crisis or storm    hyperthyroidism  . TIA (transient ischemic attack)   . Type II or unspecified type diabetes mellitus with neurological manifestations, not stated as uncontrolled(250.60)     Patient Active Problem List   Diagnosis Date Noted  . Abdominal pain 11/25/2016  . Hypotension 10/08/2016  . Unstable angina (HCC) 10/06/2016  . Shortness of breath 06/23/2015  . Obesity 05/18/2014  . Frequent falls 08/12/2013  . Pain of left clavicle 08/12/2013  . Tachycardia 01/21/2013  . Obstructive sleep apnea on CPAP 06/07/2012  . Chest pain 06/20/2011  . Uncontrolled hypertension 06/20/2011  . Diabetes mellitus (HCC) 06/20/2011    Past Surgical History:  Procedure Laterality Date  . APPENDECTOMY    . COLOSTOMY    . CORNEAL TRANSPLANT    . feet surgery     both  . HAND SURGERY     left and right  . leg surgery     left and right  . ORTHOPEDIC SURGERY     hands, feet, knees, legs  . tubes in ears     both    Current Outpatient Rx  . Order #: [id number] Class: Historical Med  . Order #: [id number] Class: Historical Med  . Order #: [id number] Class: Historical Med  . Order #: [id number] Class: Historical Med  . Order #: [id number] Class: Historical Med  . Order #: [id number] Class: Historical  Med  . Order #: [id number] Class: Historical Med  . Order #: [id number] Class: Historical Med  . Order #: [id number] Class: Historical Med  . Order #: [id number] Class: Historical Med  . Order #: [id number] Class: Historical Med  . Order #: [id number] Class: Historical Med  . Order #: [id number] Class: Historical Med  . Order #: [id number] Class: Historical Med  . Order #: [id number] Class: Historical Med  . Order #: [id number] Class: Historical Med  . Order #: [id number] Class: Normal  . Order #: [id number] Class: Historical Med  . Order #: [id number] Class: Historical Med  . Order #: [id number] Class: Historical Med  . Order  #: [id number] Class: Print  . Order #: [id number] Class: Historical Med  . Order #: [id number] Class: Historical Med  . Order #: [id number] Class: Historical Med  . Order #: [id number] Class: Historical Med  . Order #: [id number] Class: Historical Med  . Order #: [id number] Class: Historical Med  . Order #: [id number] Class: Historical Med  . Order #: [id number] Class: Historical Med  . Order #: [id number] Class: Normal  . Order #: [id number] Class: Historical Med  . Order #: [id number] Class: Historical Med  . Order #: [id number] Class: Normal  . Order #: [id number] Class: Historical Med  . Order #: [id number] Class: Historical Med  . Order #: [id number] Class: Historical Med  . Order #: [id number] Class: Historical Med  . Order #: [id number] Class: Normal  . Order #: [id number] Class: Normal  . Order #: [id number] Class: Historical Med  . Order #: [id number] Class: Historical Med  . Order #: [id number] Class: Historical Med  . Order #: [id number] Class: Historical Med  . Order #: [id number] Class: Historical Med    Allergies Penicillins; Erythromycin base; and Erythromycin base  Family History  Family history unknown: Yes    Social History Social History   Tobacco Use  . Smoking status: Current Every Day Smoker    Years: 0.00    Types: Pipe  . Smokeless tobacco: Never Used  . Tobacco comment: quit smoking friday 08/18/2016  Substance Use Topics  . Alcohol use: No    Alcohol/week: 0.0 oz    Comment: used to be moderate  . Drug use: No    Review of Systems Constitutional: No fever/chills.  No lightheadedness or syncope. Eyes: No visual changes. ENT: No sore throat. No congestion or rhinorrhea. Cardiovascular: Positive chest pain. Denies palpitations. Respiratory: Denies shortness of breath.  No cough. Gastrointestinal: No abdominal pain.  No nausea, no vomiting.  No diarrhea.  No constipation. Genitourinary: Negative for dysuria. Musculoskeletal: Negative for back pain. Skin: Negative for  rash. Neurological: Positive for headaches. No focal numbness, tingling or weakness.     ____________________________________________   PHYSICAL EXAM:  VITAL SIGNS: ED Triage Vitals  Enc Vitals Group     BP 01/04/17 1628 (!) 135/97     Pulse Rate 01/04/17 1628 99     Resp 01/04/17 1628 20     Temp 01/04/17 1628 98.6 F (37 C)     Temp src --      SpO2 01/04/17 1628 100 %     Weight 01/04/17 1629 193 lb (87.5 kg)     Height 01/04/17 1629 5\' 5"  (1.651 m)     Head Circumference --      Peak Flow --      Pain Score 01/04/17 1628 9     Pain Loc --      Pain Edu? --      Excl. in GC? --     Constitutional: Alert and oriented.  Chronically ill appearing but in no acute distress. Answers questions appropriately. Eyes: Conjunctivae are normal.  EOMI.  No scleral icterus. Head: Atraumatic. Nose: No congestion/rhinnorhea. Mouth/Throat: Mucous membranes are dry.  Neck: No stridor.  Supple.  No JVD.  No meningismus. Cardiovascular: Normal rate, regular rhythm. No murmurs, rubs or gallops.  Respiratory: Normal respiratory effort.  No accessory muscle use or retractions. Lungs CTAB.  No wheezes, rales or ronchi. Gastrointestinal: Soft, nontender and nondistended.  The patient has a colostomy bag in the left lower quadrant without any output and no surrounding tenderness to palpation.  No guarding or rebound.  No peritoneal signs. Musculoskeletal: No LE edema. No ttp in the calves or palpable cords.  Negative Homan's sign.  Chronic muscle wasting and contractures of the lower extremities bilaterally. Neurologic:  A&Ox3.  Speech is clear.  Face and smile are symmetric.  EOMI.  Moves all extremities well. Skin:  Skin is warm, dry and intact. No rash noted. Psychiatric: Mood and affect are normal. Speech and behavior are normal.  Normal judgement.  ____________________________________________   LABS (all labs ordered are listed, but only abnormal results are displayed)  Labs Reviewed   BASIC METABOLIC PANEL - Abnormal; Notable for the following components:      Result Value   Chloride 95 (*)    Glucose, Bld 311 (*)    All other components within normal limits  CBC  TROPONIN I  TROPONIN I   ____________________________________________  EKG  ED ECG REPORT I, Rockne Menghini, the attending physician, personally viewed and interpreted this ECG.   Date: 01/04/2017  EKG Time: 1633  Rate: 101  Rhythm: sinus tachycardia  Axis: leftward  Intervals:none  ST&T Change: No STEMI  ____________________________________________  RADIOLOGY  Dg Chest 2 View  Result Date: 01/04/2017 CLINICAL DATA:  36 y/o  M; chest pain today.  Left arm numbness. EXAM: CHEST  2 VIEW COMPARISON:  12/05/2016 chest radiograph FINDINGS: Stable normal cardiac silhouette given projection and technique. Low lung volumes accentuate pulmonary markings. No consolidation, effusion, or pneumothorax. Mild reverse S curvature of the thoracic spine. No acute osseous abnormality is evident. IMPRESSION: Low lung volumes.  No acute pulmonary process identified. Electronically Signed   By: Mitzi Hansen M.D.   On: 01/04/2017 17:08    ____________________________________________   PROCEDURES  Procedure(s) performed: None  Procedures  Critical Care performed: No ____________________________________________   INITIAL IMPRESSION / ASSESSMENT AND PLAN / ED COURSE  Pertinent labs & imaging results that were available during my care of the patient were reviewed by me and considered in my medical decision making (see chart for details).  36 y.o. M w/ hx of hypertension, hypertension, hyperlipidemia, diabetes presenting with hyperglycemia, chest pain.  Overall, the patient is hemodynamically stable but he does have ongoing chest pain.  I will treat him with nitroglycerin and get a troponin.  We will treat his headache with Tylenol.  Plan reevaluation for final  disposition.  ----------------------------------------- 8:26 PM on 01/04/2017 -----------------------------------------  At this time, the patient states that his chest pain has improved and his headache have improved but they are both still present.  He has some mild tachycardia with a heart rate around 108.  His first troponin was reassuring and the second 1 is pending.  His chest x-ray does not show any acute cardiopulmonary process.  The patient had 2 separate admissions in September for chest pain as well and after evaluation including echocardiogram, it was thought that his pain was unlikely to be cardiac as per cardiology evaluation.  The patient did undergo echocardiogram 10/08/16 which showed an EF of 50-55% with  some distal septal apical hypokinesis  Spoken with Dr. Elease HashimotoNahser, who is Dr. Windell HummingbirdGollan's partner, who recommends follow-up troponin and if negative, outpatient follow-up for a risk stratification study.  ____________________________________________  FINAL CLINICAL IMPRESSION(S) / ED DIAGNOSES  Final diagnoses:  Chest pain, unspecified type  Hyperglycemia  Acute nonintractable headache, unspecified headache type         NEW MEDICATIONS STARTED DURING THIS VISIT:  This SmartLink is deprecated. Use AVSMEDLIST instead to display the medication list for a patient.    Rockne MenghiniNorman, Anne-Caroline, MD 01/04/17 2034    Rockne MenghiniNorman, Anne-Caroline, MD 01/04/17 2040

## 2017-01-04 NOTE — ED Provider Notes (Signed)
Patient received in sign-out from Dr. Sharma CovertNorman.  Workup and evaluation pending repeat trop.   Patient's repeat troponin is negative.  Stable and appropriate for follow-up as an outpatient.      Willy Eddyobinson, Kadence Mikkelson, MD 01/04/17 2221

## 2017-01-04 NOTE — ED Notes (Signed)
Pt reports left side chest pain.  ntg given.

## 2017-01-04 NOTE — ED Notes (Signed)
Pt reports sob and chest pain since 1530 today.  No n/v/d.  Pt states he was laying down when pain began.  Pt was given asa by ems en route.  Pt reports pain in left chest and left back area.  Pt alert. Iv started and labs sent.

## 2017-01-04 NOTE — ED Notes (Signed)
Pt waiiting on ems transport to home.

## 2017-01-10 ENCOUNTER — Encounter: Payer: Self-pay | Admitting: Physician Assistant

## 2017-01-10 NOTE — Progress Notes (Signed)
Cardiology Office Note Date:  01/11/2017  Patient ID:  Cory Campbell, DOB April 06, 1980, MRN [id number] PCP:  Emogene MorganAycock, Ngwe A, MD  Cardiologist:  Dr. Mariah MillingGollan, MD    Chief Complaint: ED follow up  History of Present Illness: Sondra ComeCharles J Campbell is a 36 y.o. 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   Patient is seen frequently in the ED for various complaints including abdominal pain, joint pain, diarrhea, and chest pain. Prior stress test in 06/2011 showed significant GI uptake artifact. No evidence of ischemia. EF 56%. Overall, low risk stress test. He was admitted in 10/2016 with atypical chest pain. Ruled out. Echo showed EF 50-55%, normal wall motion. His pain was felt to be not cardiac and related to his MSK disorder. He was most recently seen in the ED on 01/04/17 with chest pain, hyperglycemia, and headache in the setting of poorly controlled diabetes. He ruled out. CBC unremarkable. Blood glucose 311, K+ 4.3. CXR note acute. EKG not on file for review. Outpatient follow up was advised.  He comes in noting continued, sharp left-sided chest pain that is somewhat reproducible to palpation. Pain is not associated with exertion, though given his MSK disorder it is difficult for him to exert himself. Pain feels similar to his prior non-cardiac episodes of chest pain. No associated symptoms with his chest pain. He currently notes left-sided chest pain that is 7/10 and reproducible to his touch. He has also noted about one month history of daily palpitations lasting several minutes at a time and self resolving. No associated symptoms with his palpitations. Blood sugars remain poorly controlled per his report. He saw his endocrinologist on 12/4 with  medication adjustments.    Past Medical History:  Diagnosis Date  . Abrasion or friction burn of foot and toe(s), without mention of infection   . Acid reflux   . Adopted   . Allergic rhinitis, cause unspecified   . Anxiety   . Arthritis   . Arthrogryposis   . Asthma   . Bladder wall thickening   . BPH (benign prostatic hyperplasia)   . Chronic pain syndrome   . Depressive disorder, not elsewhere classified   . Diverticulitis of colon (without mention of hemorrhage)(562.11)   . ED (erectile dysfunction)   . Herpes genitalis   . History of echocardiogram    a. 10/2016: EF 50-55%, normal wall motion  . History of stress test    a. 06/2011: significant GI uptake artifact, no evidence of ischemia, EF 56%  . HIV infection (HCC)   . HTN (hypertension)   . Hyperlipidemia   . Hypogonadism in male   . Myalgia and myositis, unspecified   . OAB (overactive bladder)   . Obesity   . Other psoriasis   . Premature baby    born premature  . Seizure disorder (HCC)   . Sleep apnea   . 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 or other cause, without mention of thyrotoxic crisis or storm    hyperthyroidism  . TIA (transient ischemic attack)   . Type II or unspecified type diabetes mellitus with neurological manifestations, not stated as uncontrolled(250.60)     Past Surgical History:  Procedure Laterality Date  . APPENDECTOMY    . COLOSTOMY    . CORNEAL TRANSPLANT    .  feet surgery     both  . HAND SURGERY     left and right  . leg surgery     left and right  . ORTHOPEDIC SURGERY     hands, feet, knees, legs  . tubes in ears     both    Current Meds  Medication Sig  . acyclovir (ZOVIRAX) 400 MG tablet Take 400 mg by mouth 2 (two) times daily.  Marland Kitchen albuterol (PROVENTIL HFA;VENTOLIN HFA) 108 (90 BASE) MCG/ACT inhaler Inhale 2 puffs into the lungs every 4 (four) hours as needed for wheezing or shortness of breath.   Marland Kitchen albuterol (PROVENTIL) (2.5 MG/3ML) 0.083% nebulizer solution  Take 2.5 mg by nebulization every 4 (four) hours as needed for wheezing or shortness of breath.  . alfuzosin (UROXATRAL) 10 MG 24 hr tablet Take 10 mg by mouth daily.  Marland Kitchen amLODipine (NORVASC) 10 MG tablet Take 10 mg by mouth daily.  Marland Kitchen aspirin 81 MG tablet Take 81 mg by mouth daily.  Marland Kitchen BIKTARVY 50-200-25 MG TABS tablet Take 1 tablet by mouth daily.  . Buprenorphine (BUTRANS) 15 MCG/HR PTWK Place 15 mg onto the skin once a week. Saturday  . buPROPion (WELLBUTRIN XL) 150 MG 24 hr tablet Take 1 tablet by mouth daily.  . chlorthalidone (HYGROTON) 25 MG tablet Take 1 tablet by mouth daily.  . citalopram (CELEXA) 20 MG tablet Take 20 mg by mouth daily.  . cloNIDine (CATAPRES - DOSED IN MG/24 HR) 0.1 mg/24hr patch Place 0.1 mg onto the skin once a week.  . diclofenac sodium (VOLTAREN) 1 % GEL Apply 2-4 g topically 4 (four) times daily as needed. For pain.  Marland Kitchen dicyclomine (BENTYL) 20 MG tablet Take 20 mg by mouth every 6 (six) hours.  . DOCOSAHEXAENOIC ACID PO Take 1 tablet by mouth daily.   . famotidine (PEPCID) 20 MG tablet Take 20 mg by mouth 2 (two) times daily.   . feeding supplement, ENSURE ENLIVE, (ENSURE ENLIVE) LIQD Take 237 mLs by mouth 2 (two) times daily between meals.  . finasteride (PROSCAR) 5 MG tablet Take 5 mg by mouth daily.  . fluticasone (FLONASE) 50 MCG/ACT nasal spray Place 2 sprays into the nose daily.  . hydrocortisone cream 0.5 % Apply 1 application topically 2 (two) times daily.  . insulin degludec (TRESIBA FLEXTOUCH) 100 UNIT/ML SOPN FlexTouch Pen Inject 120 Units into the skin daily at 10 pm.   . ketoconazole (NIZORAL) 2 % shampoo Apply 1 application topically 2 (two) times a week. tues and thursday  . lisinopril (PRINIVIL,ZESTRIL) 40 MG tablet Take 40 mg by mouth daily.  Marland Kitchen loratadine (CLARITIN) 10 MG tablet Take 10 mg by mouth daily.  . metoprolol tartrate (LOPRESSOR) 100 MG tablet Take 100 mg by mouth 2 (two) times daily.  . mirtazapine (REMERON) 45 MG tablet Take 45 mg by  mouth at bedtime.  . Multiple Vitamin (MULTIVITAMIN) tablet Take 1 tablet by mouth daily.  . naloxone (NARCAN) nasal spray 4 mg/0.1 mL One spray in either nostril once for known/suspected opioid overdose. May repeat every 2-3 minutes in alternating nostril til EMS arrives  . nitroGLYCERIN (NITROSTAT) 0.4 MG SL tablet Place 1 tablet (0.4 mg total) under the tongue every 5 (five) minutes as needed for chest pain. Call 911 if chest is not resolved w/ 3 tabs.  . nortriptyline (PAMELOR) 25 MG capsule Take 50 mg by mouth at bedtime.  Marland Kitchen NOVOLOG FLEXPEN 100 UNIT/ML FlexPen Inject 10 Units into the skin 3 (three) times daily  with meals. With every meal and every snack  . nystatin-triamcinolone (MYCOLOG II) cream Apply 1 application topically 2 (two) times daily.  Marland Kitchen. omeprazole (PRILOSEC) 20 MG capsule Take 20 mg by mouth daily.  . ondansetron (ZOFRAN-ODT) 4 MG disintegrating tablet Take 4 mg by mouth every 8 (eight) hours as needed for nausea or vomiting.  . polyethylene glycol (MIRALAX / GLYCOLAX) packet Take 17 g by mouth 3 (three) times daily.   . potassium chloride SA (K-DUR,KLOR-CON) 20 MEQ tablet take 1 tablet by mouth once daily if needed (Patient taking differently: take 1 tablet by mouth once daily)  . pravastatin (PRAVACHOL) 80 MG tablet take 1 tablet by mouth at bedtime  . prednisoLONE acetate (PRED FORTE) 1 % ophthalmic suspension Place 1 drop into the right eye daily.   Marland Kitchen. RA MELATONIN 1 MG SUBL Take 1 mg by mouth at bedtime.   . solifenacin (VESICARE) 10 MG tablet Take 10 mg by mouth daily.  Marland Kitchen. tiZANidine (ZANAFLEX) 4 MG capsule Take 4 mg by mouth 3 (three) times daily as needed.   . zolpidem (AMBIEN) 10 MG tablet Take 10 mg by mouth at bedtime.    Allergies:   Penicillins; Erythromycin base; and Erythromycin base   Social History:  The patient  reports that he has been smoking pipe.  He has smoked for the past 0.00 years. he has never used smokeless tobacco. He reports that he does not drink  alcohol or use drugs.   Family History:  The patient's Family history is unknown by patient.  ROS:   Review of Systems  Constitutional: Positive for malaise/fatigue. Negative for chills, diaphoresis, fever and weight loss.  HENT: Negative for congestion.   Eyes: Negative for discharge and redness.  Respiratory: Negative for cough, hemoptysis, sputum production, shortness of breath and wheezing.   Cardiovascular: Positive for chest pain and palpitations. Negative for orthopnea, claudication, leg swelling and PND.  Gastrointestinal: Negative for abdominal pain, blood in stool, heartburn, melena, nausea and vomiting.  Genitourinary: Negative for hematuria.  Musculoskeletal: Positive for back pain, joint pain and myalgias. Negative for falls.  Skin: Negative for rash.  Neurological: Positive for weakness. Negative for dizziness, tingling, tremors, sensory change, speech change, focal weakness and loss of consciousness.  Endo/Heme/Allergies: Does not bruise/bleed easily.  Psychiatric/Behavioral: Negative for substance abuse. The patient is not nervous/anxious.   All other systems reviewed and are negative.    PHYSICAL EXAM:  VS:  BP 120/82 (BP Location: Left Arm, Patient Position: Sitting, Cuff Size: Normal)   Pulse 99   Ht 5\' 5"  (1.651 m)   Wt 187 lb (84.8 kg)   BMI 31.12 kg/m  BMI: Body mass index is 31.12 kg/m.  Physical Exam  Constitutional: He is oriented to person, place, and time. He appears well-developed and well-nourished.  HENT:  Head: Normocephalic and atraumatic.  Eyes: Right eye exhibits no discharge. Left eye exhibits no discharge.  Neck: Normal range of motion. No JVD present.  Cardiovascular: Normal rate, regular rhythm, S1 normal, S2 normal and normal heart sounds. Exam reveals no distant heart sounds, no friction rub, no midsystolic click and no opening snap.  No murmur heard. Palpation of the left anterior chest wall reproduces his pain  Pulmonary/Chest: Effort  normal and breath sounds normal. No respiratory distress. He has no decreased breath sounds. He has no wheezes. He has no rales. He exhibits no tenderness.  Abdominal: Soft. He exhibits no distension. There is no tenderness.  Musculoskeletal: He exhibits no edema.  Muscle wasting noted  Neurological: He is alert and oriented to person, place, and time.  Skin: Skin is warm and dry. No cyanosis. Nails show no clubbing.  Psychiatric: He has a normal mood and affect. His speech is normal and behavior is normal. Judgment and thought content normal.     EKG:  Was ordered and interpreted by me today. Shows NSR, 99 bpm, nonspecific st/t changes  Recent Labs: 10/09/2016: Magnesium 1.6 12/28/2016: ALT 20 01/04/2017: BUN 18; Creatinine, Ser 0.80; Hemoglobin 15.4; Platelets 277; Potassium 4.3; Sodium 136  10/07/2016: Cholesterol 163; HDL 26; LDL Cholesterol 112; Total CHOL/HDL Ratio 6.3; Triglycerides 126; VLDL 25   Estimated Creatinine Clearance: 126.6 mL/min (by C-G formula based on SCr of 0.8 mg/dL).   Wt Readings from Last 3 Encounters:  01/11/17 187 lb (84.8 kg)  01/04/17 193 lb (87.5 kg)  12/05/16 195 lb (88.5 kg)     Other studies reviewed: Additional studies/records reviewed today include: summarized above  ASSESSMENT AND PLAN:  1. Chest pain with moderate risk for cardiac etiology: Pain is mostly atypical in presentation and reproducible to palpation on exam. Suspect this is related to his chronic pain syndrome and MSK disorder. Will have the patient undergo Lexiscan Myoview to evaluate for high-risk ischemia. If normal, no further cardiac workup at this time.   2. Palpitations: Schedule 48-hour Holter monitor. If significant arrhythmia is revealed check tsh and magnesium. Potassium at goal when checked in the ED.   3. HTN: Well controlled today. Continue current medications.   4. Chronic pain disorder/arthrogryposis multiplex congenita: Likely playing a role in his above pain. Per PCP.    Disposition: F/u with Dr. Mariah Milling in 3 months.   Current medicines are reviewed at length with the patient today.  The patient did not have any concerns regarding medicines.  Elinor Dodge PA-C 01/11/2017 10:13 AM     [facility] [place] [address] [place], [place] [address] [phone]

## 2017-01-11 ENCOUNTER — Encounter: Payer: Self-pay | Admitting: Physician Assistant

## 2017-01-11 ENCOUNTER — Ambulatory Visit (INDEPENDENT_AMBULATORY_CARE_PROVIDER_SITE_OTHER): Payer: Medicaid Other | Admitting: Physician Assistant

## 2017-01-11 VITALS — BP 120/82 | HR 99 | Ht 65.0 in | Wt 187.0 lb

## 2017-01-11 DIAGNOSIS — R002 Palpitations: Secondary | ICD-10-CM

## 2017-01-11 DIAGNOSIS — R079 Chest pain, unspecified: Secondary | ICD-10-CM | POA: Diagnosis not present

## 2017-01-11 DIAGNOSIS — I10 Essential (primary) hypertension: Secondary | ICD-10-CM | POA: Diagnosis not present

## 2017-01-11 DIAGNOSIS — G894 Chronic pain syndrome: Secondary | ICD-10-CM | POA: Diagnosis not present

## 2017-01-11 NOTE — Patient Instructions (Addendum)
Medication Instructions:  Your physician recommends that you continue on your current medications as directed. Please refer to the Current Medication list given to you today.   Labwork: none  Testing/Procedures: Your physician has recommended that you wear a holter monitor. Holter monitors are medical devices that record the heart's electrical activity. Doctors most often use these monitors to diagnose arrhythmias. Arrhythmias are problems with the speed or rhythm of the heartbeat. The monitor is a small, portable device. You can wear one while you do your normal daily activities. This is usually used to diagnose what is causing palpitations/syncope (passing out).  Your physician has requested that you have a lexiscan myoview. For further information please visit [email or web address]. Please follow instruction sheet, as given.  [facility] MYOVIEW  Your caregiver has ordered a Stress Test with nuclear imaging. The purpose of this test is to evaluate the blood supply to your heart muscle. This procedure is referred to as a "Non-Invasive Stress Test." This is because other than having an IV started in your vein, nothing is inserted or "invades" your body. Cardiac stress tests are done to find areas of poor blood flow to the heart by determining the extent of coronary artery disease (CAD). Some patients exercise on a treadmill, which naturally increases the blood flow to your heart, while others who are  unable to walk on a treadmill due to physical limitations have a pharmacologic/chemical stress agent called Lexiscan . This medicine will mimic walking on a treadmill by temporarily increasing your coronary blood flow.   Please note: these test may take anywhere between 2-4 hours to complete  [facility]  THE VOLUNTEERS AT THE FIRST DESK WILL DIRECT YOU WHERE TO GO  Date of Procedure:___Wednesday, December 12____  Arrival Time for Procedure:   7:45am Instructions  regarding medication:   xx____ : Hold diabetes medication morning of procedure XX:     Take a 1/2 dose (60units) of nighttime insulin Tuesday night (12/11)  _xx___:  Hold metoprolol night before procedure and morning of procedure   PLEASE NOTIFY THE OFFICE AT LEAST 24 HOURS IN ADVANCE IF YOU ARE UNABLE TO KEEP YOUR APPOINTMENT.  [phone] AND  PLEASE NOTIFY [facility] AT LEAST 24 HOURS IN ADVANCE IF YOU ARE UNABLE TO KEEP YOUR APPOINTMENT. [phone]  How to prepare for your Myoview test:  1. Do not eat or drink after midnight 2. No caffeine for 24 hours prior to test 3. No smoking 24 hours prior to test. 4. Your medication may be taken with water.  If your doctor stopped a medication because of this test, do not take that medication. 5. Ladies, please do not wear dresses.  Skirts or pants are appropriate. Please wear a short sleeve shirt. 6. No perfume, cologne or lotion. 7. Wear comfortable walking shoes. No heels!            Follow-Up: Your physician recommends that you schedule a follow-up appointment in: 3 months with Dr. Mariah MillingGollan.    Any Other Special Instructions Will Be Listed Below (If Applicable).     If you need a refill on your cardiac medications before your next appointment, please call your pharmacy.   Holter Monitoring A Holter monitor is a small device that is used to detect abnormal heart rhythms. It clips to your clothing and is connected by wires to flat, sticky disks (electrodes) that attach to your chest. It is worn continuously for 24-48 hours. Follow these instructions at home:  Wear your Holter monitor at all times, even while exercising and sleeping, for as long as directed by your health care provider.  Make sure that the Holter monitor is safely clipped to your clothing or close to your body as recommended by your health care provider.  Do not get the monitor or wires wet.  Do not put body lotion or moisturizer on your  chest.  Keep your skin clean.  Keep a diary of your daily activities, such as walking and doing chores. If you feel that your heartbeat is abnormal or that your heart is fluttering or skipping a beat: ? Record what you are doing when it happens. ? Record what time of day the symptoms occur.  Return your Holter monitor as directed by your health care provider.  Keep all follow-up visits as directed by your health care provider. This is important. Get help right away if:  You feel lightheaded or you faint.  You have trouble breathing.  You feel pain in your chest, upper arm, or jaw.  You feel sick to your stomach and your skin is pale, cool, or damp.  You heartbeat feels unusual or abnormal. This information is not intended to replace advice given to you by your health care provider. Make sure you discuss any questions you have with your health care provider. Document Released: 10/22/2003 Document Revised: 07/01/2015 Document Reviewed: 09/01/2013 Elsevier Interactive Patient Education  2018 [employer]. Cardiac Nuclear Scan A cardiac nuclear scan is a test that measures blood flow to the heart when a person is resting and when he or she is exercising. The test looks for problems such as:  Not enough blood reaching a portion of the heart.  The heart muscle not working normally.  You may need this test if:  You have heart disease.  You have had abnormal lab results.  You have had heart surgery or angioplasty.  You have chest pain.  You have shortness of breath.  In this test, a radioactive dye (tracer) is injected into your bloodstream. After the tracer has traveled to your heart, an imaging device is used to measure how much of the tracer is absorbed by or distributed to various areas of your heart. This procedure is usually done at a hospital and takes 2-4 hours. Tell a health care provider about:  Any allergies you have.  All medicines you are taking, including  vitamins, herbs, eye drops, creams, and over-the-counter medicines.  Any problems you or family members have had with the use of anesthetic medicines.  Any blood disorders you have.  Any surgeries you have had.  Any medical conditions you have.  Whether you are pregnant or may be pregnant. What are the risks? Generally, this is a safe procedure. However, problems may occur, including:  Serious chest pain and heart attack. This is only a risk if the stress portion of the test is done.  Rapid heartbeat.  Sensation of warmth in your chest. This usually passes quickly.  What happens before the procedure?  Ask your health care provider about changing or stopping your regular medicines. This is especially important if you are taking diabetes medicines or blood thinners.  Remove your jewelry on the day of the procedure. What happens during the procedure?  An IV tube will be inserted into one of your veins.  Your health care provider will inject a small amount of radioactive tracer through the tube.  You will wait for 20-40 minutes while the tracer travels through your  bloodstream.  Your heart activity will be monitored with an electrocardiogram (ECG).  You will lie down on an exam table.  Images of your heart will be taken for about 15-20 minutes.  You may be asked to exercise on a treadmill or stationary bike. While you exercise, your heart's activity will be monitored with an ECG, and your blood pressure will be checked. If you are unable to exercise, you may be given a medicine to increase blood flow to parts of your heart.  When blood flow to your heart has peaked, a tracer will again be injected through the IV tube.  After 20-40 minutes, you will get back on the exam table and have more images taken of your heart.  When the procedure is over, your IV tube will be removed. The procedure may vary among health care providers and hospitals. Depending on the type of tracer  used, scans may need to be repeated 3-4 hours later. What happens after the procedure?  Unless your health care provider tells you otherwise, you may return to your normal schedule, including diet, activities, and medicines.  Unless your health care provider tells you otherwise, you may increase your fluid intake. This will help flush the contrast dye from your body. Drink enough fluid to keep your urine clear or pale yellow.  It is up to you to get your test results. Ask your health care provider, or the department that is doing the test, when your results will be ready. Summary  A cardiac nuclear scan measures the blood flow to the heart when a person is resting and when he or she is exercising.  You may need this test if you are at risk for heart disease.  Tell your health care provider if you are pregnant.  Unless your health care provider tells you otherwise, increase your fluid intake. This will help flush the contrast dye from your body. Drink enough fluid to keep your urine clear or pale yellow. This information is not intended to replace advice given to you by your health care provider. Make sure you discuss any questions you have with your health care provider. Document Released: 02/18/2004 Document Revised: 01/26/2016 Document Reviewed: 01/01/2013 Elsevier Interactive Patient Education  2017 [employer]

## 2017-01-17 ENCOUNTER — Encounter: Admission: RE | Admit: 2017-01-17 | Payer: Medicaid Other | Source: Ambulatory Visit

## 2017-01-22 ENCOUNTER — Encounter
Admission: RE | Admit: 2017-01-22 | Discharge: 2017-01-22 | Disposition: A | Discharge status: Home / Self Care | Payer: Medicaid Other | Source: Ambulatory Visit | Attending: Physician Assistant | Admitting: Physician Assistant

## 2017-01-22 DIAGNOSIS — R079 Chest pain, unspecified: Secondary | ICD-10-CM | POA: Diagnosis not present

## 2017-01-22 LAB — NM MYOCAR MULTI W/SPECT W/WALL MOTION / EF
CHL CUP NUCLEAR SDS: 5
CHL CUP NUCLEAR SSS: 5
CHL CUP RESTING HR STRESS: 101 {beats}/min
LV sys vol: 29 mL
LVDIAVOL: 83 mL (ref 62–150)
NUC STRESS TID: 1.1
Peak HR: 136 {beats}/min
Percent HR: 74 %
SRS: 8

## 2017-01-22 MED ORDER — TECHNETIUM TC 99M TETROFOSMIN IV KIT
12.9200 | PACK | Freq: Once | INTRAVENOUS | Status: AC | PRN
Start: 2017-01-22 — End: 2017-01-22
  Administered 2017-01-22: 12.92 via INTRAVENOUS

## 2017-01-22 MED ORDER — TECHNETIUM TC 99M TETROFOSMIN IV KIT
31.9540 | PACK | Freq: Once | INTRAVENOUS | Status: AC | PRN
  Administered 2017-01-22: 31.954 via INTRAVENOUS

## 2017-01-22 MED ORDER — REGADENOSON 0.4 MG/5ML IV SOLN
0.4000 mg | Freq: Once | INTRAVENOUS | Status: AC
  Administered 2017-01-22: 0.4 mg via INTRAVENOUS

## 2017-02-02 ENCOUNTER — Ambulatory Visit (INDEPENDENT_AMBULATORY_CARE_PROVIDER_SITE_OTHER): Payer: Medicaid Other

## 2017-02-02 DIAGNOSIS — R002 Palpitations: Secondary | ICD-10-CM | POA: Diagnosis not present

## 2017-02-08 ENCOUNTER — Ambulatory Visit
Admission: RE | Admit: 2017-02-08 | Discharge: 2017-02-08 | Disposition: A | Discharge status: Home / Self Care | Payer: Medicaid Other | Source: Ambulatory Visit | Attending: Cardiovascular Disease | Admitting: Cardiovascular Disease

## 2017-02-08 DIAGNOSIS — I493 Ventricular premature depolarization: Secondary | ICD-10-CM | POA: Diagnosis not present

## 2017-02-08 DIAGNOSIS — R002 Palpitations: Secondary | ICD-10-CM | POA: Insufficient documentation

## 2017-02-09 ENCOUNTER — Ambulatory Visit: Admission: RE | Admit: 2017-02-09 | Payer: Medicaid Other | Source: Ambulatory Visit

## 2017-02-16 ENCOUNTER — Other Ambulatory Visit: Payer: Self-pay | Admitting: *Deleted

## 2017-02-16 ENCOUNTER — Encounter: Payer: Self-pay | Admitting: Cardiovascular Disease

## 2017-02-16 MED ORDER — NITROGLYCERIN 0.4 MG SL SUBL: 0.4000 mg | SUBLINGUAL_TABLET | SUBLINGUAL | 1 refills | Status: DC | PRN

## 2017-02-20 ENCOUNTER — Telehealth: Payer: Self-pay | Admitting: Cardiovascular Disease

## 2017-02-20 NOTE — Telephone Encounter (Signed)
Patient is returning call - believes it was for results Please call

## 2017-02-20 NOTE — Telephone Encounter (Signed)
Left voicemail message to call back  

## 2017-02-21 NOTE — Telephone Encounter (Signed)
Left voicemail message to call back  

## 2017-02-21 NOTE — Telephone Encounter (Signed)
Reviewed monitor results with patient and he verbalized understanding with no further questions at this time.  

## 2017-04-15 NOTE — Progress Notes (Signed)
Cardiology Office Note  Date:  04/17/2017   ID:  Cory ShipperCharles J Poche, DOB Nov 17, 1980, MRN [id number]  PCP:  Emogene MorganAycock, Ngwe A, MD   Chief Complaint  Patient presents with  . other    3 month f/u no complaints today. Pt neesd 90 day potassium Rx. Meds reviewed verbally with pt.    HPI:  [name] is a 37 year-old gentleman with a history of  Arthrogryposis multiplex congenita,  chronic pain in his legs,  asthma,  hypertension,  diabetes, HBA1C 9.2 severe obstructive sleep apnea on CPAP since April 2012,  obesity,  seizure disorder,  psoriasis with several  evaluations in the emergency room for chest pain on April 23, 06/01/2011.  History of frequent falls, walks with leg braces and canes Prior history of atypical chest pain, often exacerbated by falls Ostomy He presents for routine followup of his hypertension and chest pain  Weight down 20 pounds Has changed his diet, low carbohydrates Otherwise doing well Denies any chest pain, no recent falls  Last episode of chest pain with workup June 2018 after a fall, felt to be musculoskeletal Reports blood pressure stable Compliant with medications  Biggest complaint is pain in his feet at nighttime when legs are up Pain persisted into the morning reports  he is taking gabapentin 300 mg at nighttime but does not seem to be working  Reports he is scheduled [facility]  Lab work reviewed  hemoglobin A1c 9.2, [facility] Since then has had tremendous weight loss  EKG personally reviewed by myself on todays visit  shows normal sinus rhythm with rate 88 bpm, no significant ST or T-wave changes  Other past medical history reviewed He reported having Diarrhea in November 2017, Resolved [facility] [facility] [place] [facility] [place] He does have sleep apnea, uses CPAP  Previously seen in the emergency room for chest pain  Pain was somewhat atypical, EKG was unchanged, cardiac enzymes negative.    [facility]. He was started on MiraLAX for constipation. emergency room for chest pain on 02/09/2012. Workup was essentially negative. Chest pain radiates to the right, the left, up to the shoulder, sometimes downward. Symptoms present at rest and with exertion.  Blood pressure has been relatively stable.    PMH:   has a past medical history of Abrasion or friction burn of foot and toe(s), without mention of infection, Acid reflux, Adopted, Allergic rhinitis, cause unspecified, Anxiety, Arthritis, Arthrogryposis, Asthma, Bladder wall thickening, BPH (benign prostatic hyperplasia), Chronic pain syndrome, Depressive disorder, not elsewhere classified, Diverticulitis of colon (without mention of hemorrhage)(562.11), ED (erectile dysfunction), Herpes genitalis, History of echocardiogram, History of stress test, HIV infection (HCC), HTN (hypertension), Hyperlipidemia, Hypogonadism in male, Myalgia and myositis, unspecified, OAB (overactive bladder), Obesity, Other psoriasis, Premature baby, Seizure disorder (HCC), Sleep apnea, 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  PSH:    Past Surgical History:  Procedure Laterality Date  . APPENDECTOMY    . COLOSTOMY    . CORNEAL TRANSPLANT    . feet surgery     both  . HAND SURGERY     left and right  . leg surgery     left and right  . ORTHOPEDIC SURGERY     hands, feet,  knees, legs  . tubes in ears     both    Current Outpatient Medications  Medication Sig Dispense Refill  . acyclovir (ZOVIRAX) 400 MG tablet Take 400 mg by mouth 2 (two) times daily.    Marland Kitchen albuterol (PROVENTIL HFA;VENTOLIN HFA) 108 (90 BASE) MCG/ACT inhaler Inhale 2 puffs into the lungs every 4 (four) hours as needed for  wheezing or shortness of breath.     Marland Kitchen albuterol (PROVENTIL) (2.5 MG/3ML) 0.083% nebulizer solution Take 2.5 mg by nebulization every 4 (four) hours as needed for wheezing or shortness of breath.    . alfuzosin (UROXATRAL) 10 MG 24 hr tablet Take 10 mg by mouth daily.    Marland Kitchen amLODipine (NORVASC) 10 MG tablet Take 10 mg by mouth daily.    Marland Kitchen aspirin 81 MG tablet Take 81 mg by mouth daily.    Marland Kitchen BIKTARVY 50-200-25 MG TABS tablet Take 1 tablet by mouth daily.  1  . Buprenorphine (BUTRANS) 15 MCG/HR PTWK Place 15 mg onto the skin once a week. Saturday    . buPROPion (WELLBUTRIN XL) 150 MG 24 hr tablet Take 1 tablet by mouth daily.  0  . chlorthalidone (HYGROTON) 25 MG tablet Take 1 tablet by mouth daily.  1  . citalopram (CELEXA) 20 MG tablet Take 20 mg by mouth daily.    . cloNIDine (CATAPRES - DOSED IN MG/24 HR) 0.1 mg/24hr patch Place 0.1 mg onto the skin once a week.    . diclofenac sodium (VOLTAREN) 1 % GEL Apply 2-4 g topically 4 (four) times daily as needed. For pain.    Marland Kitchen dicyclomine (BENTYL) 20 MG tablet Take 20 mg by mouth every 6 (six) hours.    . DOCOSAHEXAENOIC ACID PO Take 1 tablet by mouth daily.     . famotidine (PEPCID) 20 MG tablet Take 20 mg by mouth 2 (two) times daily.     . finasteride (PROSCAR) 5 MG tablet Take 5 mg by mouth daily.    . fluticasone (FLONASE) 50 MCG/ACT nasal spray Place 2 sprays into the nose daily.    . hydrocortisone cream 0.5 % Apply 1 application topically 2 (two) times daily.    . insulin degludec (TRESIBA FLEXTOUCH) 100 UNIT/ML SOPN FlexTouch Pen Inject 120 Units into the skin daily at 10 pm.     . ketoconazole (NIZORAL) 2 % shampoo Apply 1 application topically 2 (two) times a week. tues and thursday    . lisinopril (PRINIVIL,ZESTRIL) 40 MG tablet Take 40 mg by mouth daily.    Marland Kitchen loratadine (CLARITIN) 10 MG tablet Take 10 mg by mouth daily.    . metoprolol tartrate (LOPRESSOR) 100 MG tablet Take 100 mg by mouth 2 (two) times daily.    . mirtazapine  (REMERON) 45 MG tablet Take 45 mg by mouth at bedtime.    . Multiple Vitamin (MULTIVITAMIN) tablet Take 1 tablet by mouth daily.    . naloxone (NARCAN) nasal spray 4 mg/0.1 mL One spray in either nostril once for known/suspected opioid overdose. May repeat every 2-3 minutes in alternating nostril til EMS arrives    . nitroGLYCERIN (NITROSTAT) 0.4 MG SL tablet Place 1 tablet (0.4 mg total) under the tongue every 5 (five) minutes as needed for chest pain. Call 911 if chest is not resolved w/ 3 tabs. 25 tablet 1  . NON FORMULARY Glycerna BID.    Marland Kitchen nortriptyline (PAMELOR) 25 MG capsule Take 50 mg by mouth at bedtime.    Marland Kitchen NOVOLOG FLEXPEN 100 UNIT/ML  FlexPen Inject 10 Units into the skin 3 (three) times daily with meals. With every meal and every snack  4  . omeprazole (PRILOSEC) 20 MG capsule Take 20 mg by mouth daily.    . ondansetron (ZOFRAN-ODT) 4 MG disintegrating tablet Take 4 mg by mouth every 8 (eight) hours as needed for nausea or vomiting.    . polyethylene glycol (MIRALAX / GLYCOLAX) packet Take 17 g by mouth 3 (three) times daily.     . potassium chloride SA (K-DUR,KLOR-CON) 20 MEQ tablet take 1 tablet by mouth once daily if needed (Patient taking differently: take 1 tablet by mouth once daily) 90 tablet 3  . pravastatin (PRAVACHOL) 80 MG tablet take 1 tablet by mouth at bedtime 30 tablet 3  . prednisoLONE acetate (PRED FORTE) 1 % ophthalmic suspension Place 1 drop into the right eye daily.     Marland Kitchen RA MELATONIN 1 MG SUBL Take 1 mg by mouth at bedtime.   0  . solifenacin (VESICARE) 10 MG tablet Take 10 mg by mouth daily.    Marland Kitchen zolpidem (AMBIEN) 10 MG tablet Take 10 mg by mouth at bedtime.     No current facility-administered medications for this visit.      Allergies:   Penicillins; Erythromycin base; and Erythromycin base   Social History:  The patient  reports that he has been smoking pipe.  He has smoked for the past 0.00 years. he has never used smokeless tobacco. He reports that he  does not drink alcohol or use drugs.   Family History:   Family history is unknown by patient.    Review of Systems: Review of Systems  Respiratory: Negative.   Cardiovascular: Positive for leg swelling.  Gastrointestinal: Negative.   Musculoskeletal: Negative.        Unsteady gait, leg weakness  Neurological: Negative.   Psychiatric/Behavioral: Negative.   All other systems reviewed and are negative.    PHYSICAL EXAM: VS:  BP 133/88 (BP Location: Left Arm, Patient Position: Sitting, Cuff Size: Normal)   Pulse 88   Ht 5\' 4"  (1.626 m)   Wt 172 lb (78 kg)   BMI 29.52 kg/m  , BMI Body mass index is 29.52 kg/m. Presenting in a wheelchair Constitutional:  oriented to person, place, and time. No distress.  HENT:  Head: Normocephalic and atraumatic.  Eyes:  no discharge. No scleral icterus.  Neck: Normal range of motion. Neck supple. No JVD present.  Cardiovascular: Normal rate, regular rhythm, normal heart sounds and intact distal pulses. Exam reveals no gallop and no friction rub. No edema No murmur heard. Pulmonary/Chest: Effort normal and breath sounds normal. No stridor. No respiratory distress.  no wheezes.  no rales.  no tenderness.  Abdominal: Soft.  no distension.  no tenderness.  Musculoskeletal: Legs in braces, limited range of motion Atrophy lower extremities Neurological:  normal muscle tone. Coordination normal. No atrophy Skin: Skin is warm and dry. No rash noted. not diaphoretic.  Psychiatric:  normal mood and affect. behavior is normal. Thought content normal.        Recent Labs: 10/09/2016: Magnesium 1.6 12/28/2016: ALT 20 01/04/2017: BUN 18; Creatinine, Ser 0.80; Hemoglobin 15.4; Platelets 277; Potassium 4.3; Sodium 136    Lipid Panel Lab Results  Component Value Date   CHOL 163 10/07/2016   HDL 26 (L) 10/07/2016   LDLCALC 112 (H) 10/07/2016   TRIG 126 10/07/2016      Wt Readings from Last 3 Encounters:  04/17/17 172 lb (78 kg)  01/11/17 187  lb (84.8 kg)  01/04/17 193 lb (87.5 kg)       ASSESSMENT AND PLAN:  Essential hypertension - Plan: EKG 12-Lead Blood pressure is well controlled on today's visit. No changes made to the medications.  Class 1 obesity due to excess calories without serious comorbidity with body mass  Scheduled for ostomy repair Recent weight loss  Frequent falls Denies any recent falls  Prior falls leading to chest pain Recommended caution  Obstructive sleep apnea on CPAP Compliant with his CPAP Stable  Chest pain, unspecified type Previous history of atypical chest pain Prior chest pain after a fall, musculoskeletal  Type 2 diabetes mellitus with complication, without long-term current use of insulin (HCC) working with [facility] Hemoglobin A1c up to 9.2 managed by endocrinology [facility] Previous dietary noncompliance, improved recently   preop cardiovascular No further testing needed, acceptable risk for GI/ostomy surgery   Total encounter time more than 25 minutes  Greater than 50% was spent in counseling and coordination of care with the patient  Disposition:   F/U  12 months   Orders Placed This Encounter  Procedures  . EKG 12-Lead     Signed, Dossie Arbour, M.D., Ph.D. 04/17/2017  [facility] [place], [place] [phone]

## 2017-04-17 ENCOUNTER — Encounter: Payer: Self-pay | Admitting: Cardiovascular Disease

## 2017-04-17 ENCOUNTER — Ambulatory Visit (INDEPENDENT_AMBULATORY_CARE_PROVIDER_SITE_OTHER): Payer: Medicaid Other | Admitting: Cardiovascular Disease

## 2017-04-17 VITALS — BP 133/88 | HR 88 | Ht 64.0 in | Wt 172.0 lb

## 2017-04-17 DIAGNOSIS — E118 Type 2 diabetes mellitus with unspecified complications: Secondary | ICD-10-CM

## 2017-04-17 DIAGNOSIS — R296 Repeated falls: Secondary | ICD-10-CM

## 2017-04-17 DIAGNOSIS — R079 Chest pain, unspecified: Secondary | ICD-10-CM

## 2017-04-17 DIAGNOSIS — I10 Essential (primary) hypertension: Secondary | ICD-10-CM

## 2017-04-17 DIAGNOSIS — G894 Chronic pain syndrome: Secondary | ICD-10-CM | POA: Diagnosis not present

## 2017-04-17 DIAGNOSIS — G4733 Obstructive sleep apnea (adult) (pediatric): Secondary | ICD-10-CM

## 2017-04-17 DIAGNOSIS — R0602 Shortness of breath: Secondary | ICD-10-CM

## 2017-04-17 DIAGNOSIS — Z9989 Dependence on other enabling machines and devices: Secondary | ICD-10-CM

## 2017-04-17 NOTE — Patient Instructions (Signed)

## 2017-04-22 ENCOUNTER — Other Ambulatory Visit: Payer: Self-pay | Admitting: Cardiovascular Disease

## 2017-05-01 ENCOUNTER — Telehealth: Payer: Self-pay

## 2017-05-01 NOTE — Telephone Encounter (Signed)
Pharmacy requesting a refill on Isosorbide Mono. 30 mg one tablet twice a day.  I do not see on the patient's medication list. Please review for refill. Thanks!

## 2017-05-01 NOTE — Telephone Encounter (Signed)
Isosorbide discontinued at 10/10/16 [facility].

## 2017-05-15 ENCOUNTER — Inpatient Hospital Stay
Admission: EM | Admit: 2017-05-15 | Discharge: 2017-05-18 | DRG: 683 | Disposition: A | Discharge status: Home / Self Care | Payer: Medicaid Other | Attending: Internal Medicine | Admitting: Internal Medicine

## 2017-05-15 ENCOUNTER — Encounter: Payer: Self-pay | Admitting: Emergency Medicine

## 2017-05-15 ENCOUNTER — Emergency Department: Payer: Medicaid Other

## 2017-05-15 ENCOUNTER — Other Ambulatory Visit: Payer: Self-pay

## 2017-05-15 DIAGNOSIS — N289 Disorder of kidney and ureter, unspecified: Secondary | ICD-10-CM

## 2017-05-15 DIAGNOSIS — F329 Major depressive disorder, single episode, unspecified: Secondary | ICD-10-CM | POA: Diagnosis present

## 2017-05-15 DIAGNOSIS — E860 Dehydration: Secondary | ICD-10-CM

## 2017-05-15 DIAGNOSIS — E785 Hyperlipidemia, unspecified: Secondary | ICD-10-CM | POA: Diagnosis present

## 2017-05-15 DIAGNOSIS — Z881 Allergy status to other antibiotic agents status: Secondary | ICD-10-CM

## 2017-05-15 DIAGNOSIS — J45909 Unspecified asthma, uncomplicated: Secondary | ICD-10-CM | POA: Diagnosis present

## 2017-05-15 DIAGNOSIS — Z7982 Long term (current) use of aspirin: Secondary | ICD-10-CM

## 2017-05-15 DIAGNOSIS — B20 Human immunodeficiency virus [HIV] disease: Secondary | ICD-10-CM | POA: Diagnosis present

## 2017-05-15 DIAGNOSIS — N400 Enlarged prostate without lower urinary tract symptoms: Secondary | ICD-10-CM | POA: Diagnosis present

## 2017-05-15 DIAGNOSIS — R1010 Upper abdominal pain, unspecified: Secondary | ICD-10-CM | POA: Diagnosis not present

## 2017-05-15 DIAGNOSIS — K529 Noninfective gastroenteritis and colitis, unspecified: Secondary | ICD-10-CM | POA: Diagnosis not present

## 2017-05-15 DIAGNOSIS — G894 Chronic pain syndrome: Secondary | ICD-10-CM | POA: Diagnosis present

## 2017-05-15 DIAGNOSIS — Z794 Long term (current) use of insulin: Secondary | ICD-10-CM

## 2017-05-15 DIAGNOSIS — G4733 Obstructive sleep apnea (adult) (pediatric): Secondary | ICD-10-CM | POA: Diagnosis present

## 2017-05-15 DIAGNOSIS — K219 Gastro-esophageal reflux disease without esophagitis: Secondary | ICD-10-CM | POA: Diagnosis present

## 2017-05-15 DIAGNOSIS — E871 Hypo-osmolality and hyponatremia: Secondary | ICD-10-CM

## 2017-05-15 DIAGNOSIS — Q688 Other specified congenital musculoskeletal deformities: Secondary | ICD-10-CM | POA: Diagnosis not present

## 2017-05-15 DIAGNOSIS — K567 Ileus, unspecified: Secondary | ICD-10-CM | POA: Diagnosis present

## 2017-05-15 DIAGNOSIS — Z79891 Long term (current) use of opiate analgesic: Secondary | ICD-10-CM

## 2017-05-15 DIAGNOSIS — R197 Diarrhea, unspecified: Secondary | ICD-10-CM

## 2017-05-15 DIAGNOSIS — I10 Essential (primary) hypertension: Secondary | ICD-10-CM | POA: Diagnosis present

## 2017-05-15 DIAGNOSIS — E119 Type 2 diabetes mellitus without complications: Secondary | ICD-10-CM | POA: Diagnosis present

## 2017-05-15 DIAGNOSIS — N179 Acute kidney failure, unspecified: Secondary | ICD-10-CM | POA: Diagnosis present

## 2017-05-15 DIAGNOSIS — Z8673 Personal history of transient ischemic attack (TIA), and cerebral infarction without residual deficits: Secondary | ICD-10-CM | POA: Diagnosis not present

## 2017-05-15 DIAGNOSIS — F17200 Nicotine dependence, unspecified, uncomplicated: Secondary | ICD-10-CM | POA: Diagnosis present

## 2017-05-15 DIAGNOSIS — M1990 Unspecified osteoarthritis, unspecified site: Secondary | ICD-10-CM | POA: Diagnosis present

## 2017-05-15 DIAGNOSIS — Z880 Allergy status to penicillin: Secondary | ICD-10-CM | POA: Diagnosis not present

## 2017-05-15 DIAGNOSIS — R109 Unspecified abdominal pain: Secondary | ICD-10-CM | POA: Diagnosis present

## 2017-05-15 DIAGNOSIS — Z9089 Acquired absence of other organs: Secondary | ICD-10-CM | POA: Diagnosis not present

## 2017-05-15 DIAGNOSIS — A419 Sepsis, unspecified organism: Secondary | ICD-10-CM | POA: Diagnosis present

## 2017-05-15 DIAGNOSIS — Z932 Ileostomy status: Secondary | ICD-10-CM

## 2017-05-15 DIAGNOSIS — N3281 Overactive bladder: Secondary | ICD-10-CM | POA: Diagnosis present

## 2017-05-15 DIAGNOSIS — T783XXA Angioneurotic edema, initial encounter: Secondary | ICD-10-CM | POA: Diagnosis present

## 2017-05-15 LAB — CBC WITH DIFFERENTIAL/PLATELET
BASOS ABS: 0 10*3/uL (ref 0–0.1)
BASOS PCT: 0 %
EOS PCT: 0 %
Eosinophils Absolute: 0 10*3/uL (ref 0–0.7)
HCT: 34 % — ABNORMAL LOW (ref 40.0–52.0)
Hemoglobin: 11.3 g/dL — ABNORMAL LOW (ref 13.0–18.0)
LYMPHS PCT: 9 %
Lymphs Abs: 1.1 10*3/uL (ref 1.0–3.6)
MCH: 27.9 pg (ref 26.0–34.0)
MCHC: 33.1 g/dL (ref 32.0–36.0)
MCV: 84.4 fL (ref 80.0–100.0)
Monocytes Absolute: 0.8 10*3/uL (ref 0.2–1.0)
Monocytes Relative: 6 %
Neutro Abs: 11 10*3/uL — ABNORMAL HIGH (ref 1.4–6.5)
Neutrophils Relative %: 85 %
PLATELETS: 870 10*3/uL — AB (ref 150–440)
RBC: 4.03 MIL/uL — AB (ref 4.40–5.90)
RDW: 14 % (ref 11.5–14.5)
WBC: 12.9 10*3/uL — AB (ref 3.8–10.6)

## 2017-05-15 LAB — COMPREHENSIVE METABOLIC PANEL
ALT: 18 U/L (ref 17–63)
ANION GAP: 15 (ref 5–15)
AST: 20 U/L (ref 15–41)
Albumin: 3.9 g/dL (ref 3.5–5.0)
Alkaline Phosphatase: 99 U/L (ref 38–126)
BUN: 54 mg/dL — ABNORMAL HIGH (ref 6–20)
CALCIUM: 9.4 mg/dL (ref 8.9–10.3)
CHLORIDE: 91 mmol/L — AB (ref 101–111)
CO2: 19 mmol/L — AB (ref 22–32)
Creatinine, Ser: 4.76 mg/dL — ABNORMAL HIGH (ref 0.61–1.24)
GFR calc non Af Amer: 14 mL/min — ABNORMAL LOW (ref >60)
GFR, EST AFRICAN AMERICAN: 17 mL/min — AB (ref >60)
Glucose, Bld: 164 mg/dL — ABNORMAL HIGH (ref 65–99)
Potassium: 4.9 mmol/L (ref 3.5–5.1)
SODIUM: 125 mmol/L — AB (ref 135–145)
Total Bilirubin: 0.7 mg/dL (ref 0.3–1.2)
Total Protein: 9 g/dL — ABNORMAL HIGH (ref 6.5–8.1)

## 2017-05-15 LAB — URINALYSIS, COMPLETE (UACMP) WITH MICROSCOPIC
BILIRUBIN URINE: NEGATIVE
Glucose, UA: NEGATIVE mg/dL
HGB URINE DIPSTICK: NEGATIVE
KETONES UR: NEGATIVE mg/dL
LEUKOCYTES UA: NEGATIVE
Nitrite: NEGATIVE
PROTEIN: 30 mg/dL — AB
Specific Gravity, Urine: 1.008 (ref 1.005–1.030)
pH: 5 (ref 5.0–8.0)

## 2017-05-15 LAB — LACTIC ACID, PLASMA: LACTIC ACID, VENOUS: 1.9 mmol/L (ref 0.5–1.9)

## 2017-05-15 LAB — LIPASE, BLOOD: Lipase: 32 U/L (ref 11–51)

## 2017-05-15 MED ORDER — DIPHENHYDRAMINE HCL 50 MG/ML IJ SOLN
25.0000 mg | Freq: Once | INTRAMUSCULAR | Status: AC
  Administered 2017-05-15: 25 mg via INTRAVENOUS
  Filled 2017-05-15: qty 1

## 2017-05-15 MED ORDER — SODIUM CHLORIDE 0.9 % IV SOLN
INTRAVENOUS | Status: DC
  Administered 2017-05-16 (×2): via INTRAVENOUS

## 2017-05-15 MED ORDER — NORTRIPTYLINE HCL 25 MG PO CAPS
50.0000 mg | ORAL_CAPSULE | Freq: Every day | ORAL | Status: DC
  Administered 2017-05-16 – 2017-05-17 (×3): 50 mg via ORAL
  Filled 2017-05-15 (×4): qty 2

## 2017-05-15 MED ORDER — BICTEGRAVIR-EMTRICITAB-TENOFOV 50-200-25 MG PO TABS
1.0000 | ORAL_TABLET | Freq: Every day | ORAL | Status: DC
  Filled 2017-05-15: qty 1

## 2017-05-15 MED ORDER — ALBUTEROL SULFATE (2.5 MG/3ML) 0.083% IN NEBU: 2.5000 mg | INHALATION_SOLUTION | RESPIRATORY_TRACT | Status: DC | PRN

## 2017-05-15 MED ORDER — FINASTERIDE 5 MG PO TABS
5.0000 mg | ORAL_TABLET | Freq: Every day | ORAL | Status: DC
  Administered 2017-05-16 – 2017-05-18 (×3): 5 mg via ORAL
  Filled 2017-05-15 (×3): qty 1

## 2017-05-15 MED ORDER — ALFUZOSIN HCL ER 10 MG PO TB24
10.0000 mg | ORAL_TABLET | Freq: Every day | ORAL | Status: DC
  Administered 2017-05-16 – 2017-05-18 (×3): 10 mg via ORAL
  Filled 2017-05-15 (×3): qty 1

## 2017-05-15 MED ORDER — PRAVASTATIN SODIUM 40 MG PO TABS
80.0000 mg | ORAL_TABLET | Freq: Every day | ORAL | Status: DC
  Administered 2017-05-16 – 2017-05-17 (×2): 80 mg via ORAL
  Filled 2017-05-15 (×2): qty 2

## 2017-05-15 MED ORDER — FENTANYL CITRATE (PF) 100 MCG/2ML IJ SOLN
50.0000 ug | Freq: Once | INTRAMUSCULAR | Status: AC
  Administered 2017-05-15: 50 ug via INTRAVENOUS
  Filled 2017-05-15: qty 2

## 2017-05-15 MED ORDER — BUPROPION HCL ER (XL) 150 MG PO TB24
150.0000 mg | ORAL_TABLET | Freq: Every day | ORAL | Status: DC
  Administered 2017-05-16 – 2017-05-18 (×3): 150 mg via ORAL
  Filled 2017-05-15 (×3): qty 1

## 2017-05-15 MED ORDER — POTASSIUM CHLORIDE CRYS ER 20 MEQ PO TBCR
20.0000 meq | EXTENDED_RELEASE_TABLET | Freq: Every day | ORAL | Status: DC
  Administered 2017-05-16 – 2017-05-18 (×3): 20 meq via ORAL
  Filled 2017-05-15 (×3): qty 1

## 2017-05-15 MED ORDER — POLYETHYLENE GLYCOL 3350 17 G PO PACK
17.0000 g | PACK | Freq: Three times a day (TID) | ORAL | Status: DC
  Administered 2017-05-16 – 2017-05-18 (×8): 17 g via ORAL
  Filled 2017-05-15 (×8): qty 1

## 2017-05-15 MED ORDER — HYDROMORPHONE HCL 1 MG/ML IJ SOLN
1.0000 mg | Freq: Once | INTRAMUSCULAR | Status: AC
  Administered 2017-05-15: 1 mg via INTRAVENOUS
  Filled 2017-05-15: qty 1

## 2017-05-15 MED ORDER — IOPAMIDOL (ISOVUE-300) INJECTION 61%
30.0000 mL | Freq: Once | INTRAVENOUS | Status: AC
  Administered 2017-05-15: 15 mL via ORAL

## 2017-05-15 MED ORDER — PANTOPRAZOLE SODIUM 40 MG PO TBEC: 40.0000 mg | DELAYED_RELEASE_TABLET | Freq: Every day | ORAL | Status: DC

## 2017-05-15 MED ORDER — METRONIDAZOLE IN NACL 5-0.79 MG/ML-% IV SOLN
500.0000 mg | Freq: Once | INTRAVENOUS | Status: AC
  Administered 2017-05-15: 500 mg via INTRAVENOUS
  Filled 2017-05-15: qty 100

## 2017-05-15 MED ORDER — ONDANSETRON 4 MG PO TBDP
4.0000 mg | ORAL_TABLET | Freq: Three times a day (TID) | ORAL | Status: DC | PRN
  Administered 2017-05-16: 4 mg via ORAL
  Filled 2017-05-15 (×2): qty 1

## 2017-05-15 MED ORDER — ADULT MULTIVITAMIN W/MINERALS CH
1.0000 | ORAL_TABLET | Freq: Every day | ORAL | Status: DC
  Administered 2017-05-16 – 2017-05-18 (×3): 1 via ORAL
  Filled 2017-05-15 (×3): qty 1

## 2017-05-15 MED ORDER — LEVOFLOXACIN IN D5W 750 MG/150ML IV SOLN
750.0000 mg | Freq: Once | INTRAVENOUS | Status: AC
  Administered 2017-05-15: 750 mg via INTRAVENOUS
  Filled 2017-05-15: qty 150

## 2017-05-15 MED ORDER — METHYLPREDNISOLONE SODIUM SUCC 125 MG IJ SOLR
125.0000 mg | Freq: Once | INTRAMUSCULAR | Status: AC
Start: 2017-05-15 — End: 2017-05-15
  Administered 2017-05-15: 125 mg via INTRAVENOUS
  Filled 2017-05-15: qty 2

## 2017-05-15 MED ORDER — ACYCLOVIR 400 MG PO TABS
400.0000 mg | ORAL_TABLET | Freq: Two times a day (BID) | ORAL | Status: DC
  Filled 2017-05-15: qty 1

## 2017-05-15 MED ORDER — ONDANSETRON HCL 4 MG/2ML IJ SOLN
4.0000 mg | Freq: Once | INTRAMUSCULAR | Status: AC
  Administered 2017-05-15: 4 mg via INTRAVENOUS
  Filled 2017-05-15: qty 2

## 2017-05-15 MED ORDER — CIPROFLOXACIN IN D5W 400 MG/200ML IV SOLN: 400.0000 mg | INTRAVENOUS | Status: DC

## 2017-05-15 MED ORDER — SODIUM CHLORIDE 0.9 % IV BOLUS
1000.0000 mL | Freq: Once | INTRAVENOUS | Status: AC
  Administered 2017-05-15: 1000 mL via INTRAVENOUS

## 2017-05-15 MED ORDER — HEPARIN SODIUM (PORCINE) 5000 UNIT/ML IJ SOLN
5000.0000 [IU] | Freq: Three times a day (TID) | INTRAMUSCULAR | Status: DC
  Administered 2017-05-16 – 2017-05-18 (×8): 5000 [IU] via SUBCUTANEOUS
  Filled 2017-05-15 (×8): qty 1

## 2017-05-15 MED ORDER — ASPIRIN EC 81 MG PO TBEC
81.0000 mg | DELAYED_RELEASE_TABLET | Freq: Every day | ORAL | Status: DC
  Administered 2017-05-16 – 2017-05-18 (×3): 81 mg via ORAL
  Filled 2017-05-15 (×3): qty 1

## 2017-05-15 MED ORDER — DOCUSATE SODIUM 100 MG PO CAPS: 100.0000 mg | ORAL_CAPSULE | Freq: Two times a day (BID) | ORAL | Status: DC | PRN

## 2017-05-15 MED ORDER — FAMOTIDINE 20 MG PO TABS
20.0000 mg | ORAL_TABLET | Freq: Every day | ORAL | Status: DC
  Administered 2017-05-16 – 2017-05-18 (×3): 20 mg via ORAL
  Filled 2017-05-15 (×3): qty 1

## 2017-05-15 MED ORDER — OXYCODONE HCL 5 MG PO TABS
5.0000 mg | ORAL_TABLET | Freq: Four times a day (QID) | ORAL | Status: DC | PRN
Start: 2017-05-15 — End: 2017-05-18
  Administered 2017-05-16 – 2017-05-17 (×3): 5 mg via ORAL
  Filled 2017-05-15 (×3): qty 1

## 2017-05-15 MED ORDER — DICYCLOMINE HCL 20 MG PO TABS
20.0000 mg | ORAL_TABLET | Freq: Four times a day (QID) | ORAL | Status: DC
  Administered 2017-05-16 – 2017-05-18 (×12): 20 mg via ORAL
  Filled 2017-05-15 (×13): qty 1

## 2017-05-15 MED ORDER — CITALOPRAM HYDROBROMIDE 20 MG PO TABS
20.0000 mg | ORAL_TABLET | Freq: Every day | ORAL | Status: DC
  Administered 2017-05-16 – 2017-05-18 (×3): 20 mg via ORAL
  Filled 2017-05-15 (×3): qty 1

## 2017-05-15 MED ORDER — FLUTICASONE PROPIONATE 50 MCG/ACT NA SUSP
2.0000 | Freq: Every day | NASAL | Status: DC
  Administered 2017-05-17 – 2017-05-18 (×2): 2 via NASAL
  Filled 2017-05-15 (×3): qty 16

## 2017-05-15 MED ORDER — METRONIDAZOLE IN NACL 5-0.79 MG/ML-% IV SOLN
500.0000 mg | Freq: Three times a day (TID) | INTRAVENOUS | Status: DC
  Administered 2017-05-16 – 2017-05-18 (×8): 500 mg via INTRAVENOUS
  Filled 2017-05-15 (×11): qty 100

## 2017-05-15 MED ORDER — INSULIN ASPART 100 UNIT/ML ~~LOC~~ SOLN
0.0000 [IU] | Freq: Three times a day (TID) | SUBCUTANEOUS | Status: DC
  Administered 2017-05-16: 2 [IU] via SUBCUTANEOUS
  Administered 2017-05-16 (×2): 3 [IU] via SUBCUTANEOUS
  Administered 2017-05-17 (×3): 2 [IU] via SUBCUTANEOUS
  Administered 2017-05-18: 5 [IU] via SUBCUTANEOUS
  Administered 2017-05-18 (×2): 3 [IU] via SUBCUTANEOUS
  Filled 2017-05-15 (×9): qty 1

## 2017-05-15 NOTE — Progress Notes (Addendum)
Pharmacy Antibiotic Note  Cory Campbell is a 37 y.o. male admitted on 05/15/2017 with intra-abdominal infection.  Pharmacy has been consulted for cipro dosing.  Plan: Patient received levaquin 750 mg IV x 1   Will start cipro 400 mg IV q24h to start 04/10 for CrCl 5 - 29 ml/min  04/10 @ 0500 CrCl 31.2 ml/min will readjust cipro dose back to 400 mg IV q12h to start 04/10 @ 2200 24 hrs after initial levaquin dose. 04/09 EKG QTc 411 WNL  Height: 5\' 3"  (160 cm) Weight: 175 lb (79.4 kg) IBW/kg (Calculated) : 56.9  Temp (24hrs), Avg:98.3 F (36.8 C), Min:98.3 F (36.8 C), Max:98.3 F (36.8 C)  Recent Labs  Lab 05/15/17 1618  WBC 12.9*  CREATININE 4.76*  LATICACIDVEN 1.9    Estimated Creatinine Clearance: 19.8 mL/min (A) (by C-G formula based on SCr of 4.76 mg/dL (H)).    Allergies  Allergen Reactions  . Penicillins Itching, Rash and Hives    Has patient had a PCN reaction causing immediate rash, facial/tongue/throat swelling, SOB or lightheadedness with hypotension: Yes Has patient had a PCN reaction causing severe rash involving mucus membranes or skin necrosis: No Has patient had a PCN reaction that required hospitalization No Has patient had a PCN reaction occurring within the last 10 years: No If all of the above answers are "NO", then may proceed with Cephalosporin use. Has patient had a PCN reaction causing immediate rash, facial/tongue/throat swelling, SOB or lightheadedness with hypotension: Yes Has patient had a PCN reaction causing severe rash involving mucus membranes or skin necrosis: No Has patient had a PCN reaction that required hospitalization No Has patient had a PCN reaction occurring within the last 10 years: No If all of the above answers are "NO", then may proceed with Cephalosporin use.  . Erythromycin Base Itching and Rash  . Erythromycin Base Itching and Rash    Thank you for allowing pharmacy to be a part of this patient's care.  Thomasene Rippleavid Murrel Freet,  PharmD, BCPS Clinical Pharmacist 05/15/2017

## 2017-05-15 NOTE — H&P (Addendum)
[employer] - [place] at [facility]   PATIENT NAME: Cory Campbell    MR#:  [id number]  DATE OF BIRTH:  [birth year]  DATE OF ADMISSION:  05/15/2017  PRIMARY CARE PHYSICIAN: Emogene MorganAycock, Ngwe A, MD   REQUESTING/REFERRING PHYSICIAN: Seidacki  CHIEF COMPLAINT:   Chief Complaint  Patient presents with  . Abdominal Pain    HISTORY OF PRESENT ILLNESS: Cory GeorgeCharles Halseth  is a 37 y.o. male with a known history of Acid reflux, anxiety,arthrogryposis, asthma, bladder wall thickening, chronic pain syndrome, depression, diverticulitis of colon, hypertension, hyperlipidemia, HIV infection, sleep apnea, diabetes- had reversal of cholecyst[facility] done at [facility] 10 days ago. Since then he kept running extensive diarrhea and having abdominal pain so finally called ambulance and brought to emergency room today. He denies having any fever. But in ER noted to have hypotension and tachycardia, also noted to be have acute renal failure.his white blood cell count is elevated. CT scan abdomen showed extensive edema as intestinal walls with some surrounding strandings and some gas, ER physician called the surgeons at [facility] and discuss with them this CT scan findings. They have suggested as he had extensive surgery. Findings are expected to have after the surgery and his lactic acid is still not very high so it is mostly postsurgical edema and we should just treat him for his dehydration with IV fluids. ER physician also suspected underlying infection and started on broad-spectrum antibiotics with IV fluid and gave it to hospitalist team for further management.  PAST MEDICAL HISTORY:   Past Medical History:  Diagnosis Date  . Abrasion or friction burn of foot and toe(s), without mention of infection   . Acid reflux   . Adopted   . Allergic rhinitis, cause unspecified   . Anxiety   . Arthritis   . Arthrogryposis   . Asthma   . Bladder wall thickening   . BPH (benign prostatic hyperplasia)   . Chronic pain  syndrome   . Depressive disorder, not elsewhere classified   . Diverticulitis of colon (without mention of hemorrhage)(562.11)   . ED (erectile dysfunction)   . Herpes genitalis   . History of echocardiogram    a. 10/2016: EF 50-55%, normal wall motion  . History of stress test    a. 06/2011: significant GI uptake artifact, no evidence of ischemia, EF 56%  . HIV infection (HCC)   . HTN (hypertension)   . Hyperlipidemia   . Hypogonadism in male   . Myalgia and myositis, unspecified   . OAB (overactive bladder)   . Obesity   . Other psoriasis   . Premature baby    born premature  . Seizure disorder (HCC)   . Sleep apnea   . 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 or other cause, without mention of thyrotoxic crisis or storm    hyperthyroidism  . TIA (transient ischemic attack)   . Type II or unspecified type diabetes mellitus with neurological manifestations, not stated as uncontrolled(250.60)     PAST SURGICAL HISTORY:  Past Surgical History:  Procedure Laterality Date  . APPENDECTOMY    . COLOSTOMY    . CORNEAL TRANSPLANT    . feet surgery     both  . HAND SURGERY     left and right  . leg surgery     left and right  . ORTHOPEDIC SURGERY     hands, feet, knees, legs  . tubes in ears     both    SOCIAL HISTORY:  Social History  Tobacco Use  . Smoking status: Current Every Day Smoker    Years: 0.00    Types: Pipe  . Smokeless tobacco: Never Used  . Tobacco comment: quit smoking friday 08/18/2016  Substance Use Topics  . Alcohol use: No    Alcohol/week: 0.0 oz    Comment: used to be moderate    FAMILY HISTORY:  Family History  Family history unknown: Yes    DRUG ALLERGIES:  Allergies  Allergen Reactions  . Penicillins Itching, Rash and Hives    Has patient had a PCN reaction causing immediate rash, facial/tongue/throat swelling, SOB or lightheadedness with hypotension: Yes Has patient had a PCN reaction causing severe rash involving mucus membranes  or skin necrosis: No Has patient had a PCN reaction that required hospitalization No Has patient had a PCN reaction occurring within the last 10 years: No If all of the above answers are "NO", then may proceed with Cephalosporin use. Has patient had a PCN reaction causing immediate rash, facial/tongue/throat swelling, SOB or lightheadedness with hypotension: Yes Has patient had a PCN reaction causing severe rash involving mucus membranes or skin necrosis: No Has patient had a PCN reaction that required hospitalization No Has patient had a PCN reaction occurring within the last 10 years: No If all of the above answers are "NO", then may proceed with Cephalosporin use.  . Erythromycin Base Itching and Rash  . Erythromycin Base Itching and Rash    REVIEW OF SYSTEMS:   CONSTITUTIONAL: No fever, fatigue or weakness.  EYES: No blurred or double vision.  EARS, NOSE, AND THROAT: No tinnitus or ear pain.  RESPIRATORY: No cough, shortness of breath, wheezing or hemoptysis.  CARDIOVASCULAR: No chest pain, orthopnea, edema.  GASTROINTESTINAL: No nausea, vomiting,he have diarrhea or abdominal pain.  GENITOURINARY: No dysuria, hematuria.  ENDOCRINE: No polyuria, nocturia,  HEMATOLOGY: No anemia, easy bruising or bleeding SKIN: No rash or lesion. MUSCULOSKELETAL: No joint pain or arthritis.   NEUROLOGIC: No tingling, numbness, weakness.  PSYCHIATRY: No anxiety or depression.   MEDICATIONS AT HOME:  Prior to Admission medications   Medication Sig Start Date End Date Taking? Authorizing Provider  acyclovir (ZOVIRAX) 400 MG tablet Take 400 mg by mouth 2 (two) times daily.    [provider]  albuterol (PROVENTIL HFA;VENTOLIN HFA) 108 (90 BASE) MCG/ACT inhaler Inhale 2 puffs into the lungs every 4 (four) hours as needed for wheezing or shortness of breath.     [provider]  albuterol (PROVENTIL) (2.5 MG/3ML) 0.083% nebulizer solution Take 2.5 mg by nebulization every 4 (four)  hours as needed for wheezing or shortness of breath.    [provider]  alfuzosin (UROXATRAL) 10 MG 24 hr tablet Take 10 mg by mouth daily.    [provider]  amLODipine (NORVASC) 10 MG tablet Take 10 mg by mouth daily.    [provider]  aspirin 81 MG tablet Take 81 mg by mouth daily.    [provider]  BIKTARVY 50-200-25 MG TABS tablet Take 1 tablet by mouth daily. 11/21/16   [provider]  Buprenorphine (BUTRANS) 15 MCG/HR PTWK Place 15 mg onto the skin once a week. Saturday    [provider]  buPROPion (WELLBUTRIN XL) 150 MG 24 hr tablet Take 1 tablet by mouth daily. 11/16/16   [provider]  chlorthalidone (HYGROTON) 25 MG tablet Take 1 tablet by mouth daily. 11/15/16   [provider]  citalopram (CELEXA) 20 MG tablet Take 20 mg by mouth daily.  [provider]  cloNIDine (CATAPRES - DOSED IN MG/24 HR) 0.1 mg/24hr patch Place 0.1 mg onto the skin once a week.    [provider]  diclofenac sodium (VOLTAREN) 1 % GEL Apply 2-4 g topically 4 (four) times daily as needed. For pain.    [provider]  dicyclomine (BENTYL) 20 MG tablet Take 20 mg by mouth every 6 (six) hours.    [provider]  DOCOSAHEXAENOIC ACID PO Take 1 tablet by mouth daily.     [provider]  famotidine (PEPCID) 20 MG tablet Take 20 mg by mouth 2 (two) times daily.  01/03/16   [provider]  finasteride (PROSCAR) 5 MG tablet Take 5 mg by mouth daily.    [provider]  fluticasone (FLONASE) 50 MCG/ACT nasal spray Place 2 sprays into the nose daily.    [provider]  hydrocortisone cream 0.5 % Apply 1 application topically 2 (two) times daily.    [provider]  insulin degludec (TRESIBA FLEXTOUCH) 100 UNIT/ML SOPN FlexTouch Pen Inject 120 Units into the skin daily at 10 pm.     [provider]  ketoconazole (NIZORAL) 2 % shampoo Apply 1  application topically 2 (two) times a week. tues and thursday 08/27/14   [provider]  lisinopril (PRINIVIL,ZESTRIL) 40 MG tablet Take 40 mg by mouth daily.    [provider]  loratadine (CLARITIN) 10 MG tablet Take 10 mg by mouth daily.    [provider]  metoprolol tartrate (LOPRESSOR) 100 MG tablet Take 100 mg by mouth 2 (two) times daily.    [provider]  mirtazapine (REMERON) 45 MG tablet Take 45 mg by mouth at bedtime.    [provider]  Multiple Vitamin (MULTIVITAMIN) tablet Take 1 tablet by mouth daily.    [provider]  naloxone [facility]) nasal spray 4 mg/0.1 mL One spray in either nostril once for known/suspected opioid overdose. May repeat every 2-3 minutes in alternating nostril til EMS arrives 01/12/16   [provider]  nitroGLYCERIN (NITROSTAT) 0.4 MG SL tablet DISSOLVE 1 TABLET UNDER THE TONGUE EVERY 5 MINUTES AS NEEDED FOR CHEST PAIN. CALL 911 IF CHEST IS NOT RESOLVED WITH 3 TABLETS 04/23/17   Gollan, Tollie Pizza, MD  NON FORMULARY Glycerna BID.    [provider]  nortriptyline (PAMELOR) 25 MG capsule Take 50 mg by mouth at bedtime.    [provider]  NOVOLOG FLEXPEN 100 UNIT/ML FlexPen Inject 10 Units into the skin 3 (three) times daily with meals. With every meal and every snack 12/13/15   [provider]  omeprazole (PRILOSEC) 20 MG capsule Take 20 mg by mouth daily.    [provider]  ondansetron (ZOFRAN-ODT) 4 MG disintegrating tablet Take 4 mg by mouth every 8 (eight) hours as needed for nausea or vomiting.    [provider]  polyethylene glycol (MIRALAX / GLYCOLAX) packet Take 17 g by mouth 3 (three) times daily.     [provider]  potassium chloride SA (K-DUR,KLOR-CON) 20 MEQ tablet take 1 tablet by mouth once daily if needed Patient taking differently: take 1 tablet by mouth once daily 02/08/16   Antonieta Iba, MD  pravastatin (PRAVACHOL) 80 MG  tablet take 1 tablet by mouth at bedtime 10/10/16   Antonieta Iba, MD  prednisoLONE acetate (PRED FORTE) 1 % ophthalmic suspension Place 1 drop into the right eye daily.     [provider]  RA  MELATONIN 1 MG SUBL Take 1 mg by mouth at bedtime.  12/27/15   [provider]  solifenacin (VESICARE) 10 MG tablet Take 10 mg by mouth daily.    [provider]  zolpidem (AMBIEN) 10 MG tablet Take 10 mg by mouth at bedtime.    [provider]      PHYSICAL EXAMINATION:   VITAL SIGNS: Blood pressure 113/72, pulse (!) 103, temperature 98.3 F (36.8 C), temperature source Oral, resp. rate 17, height 5\' 3"  (1.6 m), weight 79.4 kg (175 lb), SpO2 97 %.  GENERAL:  37 y.o.-year-old patient lying in the bed with no acute distress.  EYES: Pupils equal, round, reactive to light and accommodation. No scleral icterus. Extraocular muscles intact.  HEENT: Head atraumatic, normocephalic. Oropharynx and nasopharynx clear.  NECK:  Supple, no jugular venous distention. No thyroid enlargement, no tenderness.  LUNGS: Normal breath sounds bilaterally, no wheezing, rales,rhonchi or crepitation. No use of accessory muscles of respiration.  CARDIOVASCULAR: S1, S2 normal. No murmurs, rubs, or gallops.  ABDOMEN: Soft, nontender, nondistended. Bowel sounds present. No organomegaly or mass. Colostomy in place. Wound VAC in place. EXTREMITIES: No pedal edema, cyanosis, or clubbing. Atrophic limbs and deformities of his fingers present. NEUROLOGIC: Cranial nerves II through XII are intact. Muscle strength 4/5 in upper extremities. Sensation intact. Gait not checked.  PSYCHIATRIC: The patient is alert and oriented x 3.  SKIN: No obvious rash, lesion, or ulcer.   LABORATORY PANEL:   CBC Recent Labs  Lab 05/15/17 1618  WBC 12.9*  HGB 11.3*  HCT 34.0*  PLT 870*  MCV 84.4  MCH 27.9  MCHC 33.1  RDW 14.0  LYMPHSABS 1.1  MONOABS 0.8  EOSABS 0.0  BASOSABS 0.0    ------------------------------------------------------------------------------------------------------------------  Chemistries  Recent Labs  Lab 05/15/17 1618  NA 125*  K 4.9  CL 91*  CO2 19*  GLUCOSE 164*  BUN 54*  CREATININE 4.76*  CALCIUM 9.4  AST 20  ALT 18  ALKPHOS 99  BILITOT 0.7   ------------------------------------------------------------------------------------------------------------------ estimated creatinine clearance is 19.8 mL/min (A) (by C-G formula based on SCr of 4.76 mg/dL (H)). ------------------------------------------------------------------------------------------------------------------ No results for input(s): TSH, T4TOTAL, T3FREE, THYROIDAB in the last 72 hours.  Invalid input(s): FREET3   Coagulation profile No results for input(s): INR, PROTIME in the last 168 hours. ------------------------------------------------------------------------------------------------------------------- No results for input(s): DDIMER in the last 72 hours. -------------------------------------------------------------------------------------------------------------------  Cardiac Enzymes No results for input(s): CKMB, TROPONINI, MYOGLOBIN in the last 168 hours.  Invalid input(s): CK ------------------------------------------------------------------------------------------------------------------ Invalid input(s): POCBNP  ---------------------------------------------------------------------------------------------------------------  Urinalysis    Component Value Date/Time   COLORURINE YELLOW (A) 05/15/2017 1618   APPEARANCEUR CLOUDY (A) 05/15/2017 1618   APPEARANCEUR Cloudy 05/16/2014 0350   LABSPEC 1.008 05/15/2017 1618   LABSPEC 1.018 05/16/2014 0350   PHURINE 5.0 05/15/2017 1618   GLUCOSEU NEGATIVE 05/15/2017 1618   GLUCOSEU Negative 05/16/2014 0350   HGBUR NEGATIVE 05/15/2017 1618   BILIRUBINUR NEGATIVE 05/15/2017 1618   BILIRUBINUR Negative  05/16/2014 0350   KETONESUR NEGATIVE 05/15/2017 1618   PROTEINUR 30 (A) 05/15/2017 1618   NITRITE NEGATIVE 05/15/2017 1618   LEUKOCYTESUR NEGATIVE 05/15/2017 1618   LEUKOCYTESUR 1+ 05/16/2014 0350     RADIOLOGY: Ct Abdomen Pelvis Wo Contrast  Result Date: 05/15/2017 CLINICAL DATA:  Abdominal pain history of ileostomy EXAM: CT ABDOMEN AND PELVIS WITHOUT CONTRAST TECHNIQUE: Multidetector CT imaging of the abdomen and pelvis was performed following the standard protocol without IV contrast. COMPARISON:  CT 12/05/2016, 11/25/2016, 02/26/2016, 12/27/2010 FINDINGS: Lower chest:  Lung bases demonstrate no acute consolidation or effusion. Mild dependent atelectasis. Small pericardial effusion. Hepatobiliary: Calcified gallstones. No focal hepatic abnormality or biliary dilatation Pancreas: Unremarkable. No pancreatic ductal dilatation or surrounding inflammatory changes. Spleen: Normal in size without focal abnormality. Adrenals/Urinary Tract: Adrenal glands are unremarkable. Kidneys are normal, without renal calculi, focal lesion, or hydronephrosis. Bladder is unremarkable. Stomach/Bowel: Stomach is moderately enlarged. No evidence for small bowel obstruction. New left lower quadrant ileostomy. Prior sigmoid colectomy. Small moderate fat containing parastomal hernia. Mild edema in the adjacent subcutaneous fat. Takedown of previously noted left lower quadrant colostomy with reanastomosis at the sigmoid colon. Diffuse inflammatory changes within the upper pelvis, anterior abdomen in left upper and lower quadrants. Wall thickening of the splenic flexure, descending colon and sigmoid colon. Possible small foci of extraluminal gas at the junction of the descending and rectosigmoid colon. Thickened small bowel loops in the left hemiabdomen. Vascular/Lymphatic: Diminutive appearing distal abdominal aorta and iliac vessels. Mild aortic atherosclerosis. No significantly enlarged lymph nodes. Reproductive: Prostate is  unremarkable. Other: Negative for free air. Small foci of gas within the midline abdomen terrier abdominal wall in the region of postoperative scarring. There is soft tissue thickening in the region as well is thickening of the underlying rectus. 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 IMPRESSION: 1. Interval takedown of left lower quadrant colostomy with reanastomosis at the rectosigmoid colon and creation of left lower quadrant ileostomy. Diffuse edema and soft tissue inflammatory changes within the left upper and lower quadrants with bowel wall thickening involving the splenic flexure, descending colon and sigmoid colon. Pericolonic soft tissue stranding and edema. Not certain if the findings are related to resolving postoperative change versus acute infectious or inflammatory colitis. There may be small extraluminal gas in the left lower quadrant at the junction of the descending and rectosigmoid colon, either representing small foci of residual operative gas versus small contained perforation. Correlate with surgical history. 2. 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 Edema and soft tissue stranding within the subcutaneous fat adjacent to the ileostomy bowel loop, probably due to postoperative change. Adjacent edema and soft tissue thickening of the midline ventral abdominal wall with thickening of the rectus, small foci of soft tissue gas, and edema in the anterior mesentery, presumably due to recent surgery. 3. Thickened small bowel loops in the left hemiabdomen, likely reactive 4. Gallstones Electronically Signed   By: Jasmine Pang M.D.   On: 05/15/2017 19:05    EKG: Orders placed or performed during the hospital encounter of 05/15/17  . ED EKG  . ED EKG  . EKG 12-Lead  . EKG 12-Lead    IMPRESSION AND PLAN:  * Acute renal failure due to dehydration   IV fluids  and continue monitoring.   Hold antihypertensive medication to allow normal blood pressure.  * possible sepsis    Patient has all the signs of sepsis including hypotension, tachycardia, elevated white blood cell count and CT scan shows some finding of edema on intestinal walls with some stranding and gas. But the surgeon at [facility] suggested these are post surgical changes and his clinical symptoms are due to his dehydration.   I would still treat him for possible intra-abdominal infection at this time and call general surgical consult for further guidance.  * HIV   Continue his home medications.  * diabetes   Hold long-acting insulin due to renal failure and keep on sliding scale coverage.  *  depression   Continue home medications.  * Smoking   Counselling to quit smoking for 4 min  All the records are reviewed and case discussed with ED provider. Management plans discussed with the patient, family and they are in agreement.  CODE STATUS: full. Code Status History    Date Active Date Inactive Code Status Order ID Comments User Context   11/25/2016 1327 11/26/2016 1930 Full Code [id number]  Altamese Dilling, MD Inpatient   10/06/2016 1953 10/10/2016 1804 Full Code [id number]  Ramonita Lab, MD Inpatient       TOTAL TIME TAKING CARE OF THIS PATIENT: 50 minutes.    Altamese Dilling M.D on 05/15/2017   Between 7am to 6pm - Pager - [phone]  After 6pm go to [email or web address] - Social research officer, government  [facility] [place] [facility]  Office  [phone]  CC: Primary care physician; Emogene Morgan, MD   Note: This dictation was prepared with Dragon dictation along with smaller phrase technology. Any transcriptional errors that result from this process are unintentional.

## 2017-05-15 NOTE — ED Notes (Signed)
Dr. Vachhani at bedside at this time. 

## 2017-05-15 NOTE — ED Triage Notes (Signed)
Pt presents to ED via [facility] from home with c/o abdominal pain. Per EMS pt has hx of diverticulitis and has been out of his pain medication since last week. Pt with noted ileostomy on assessment. Per EMS unknown if patient has been taking medications appropriately. Pt also c/o tongue swelling, +swelling noted to L side of tongue.

## 2017-05-15 NOTE — ED Notes (Signed)
UA collected at this time, lights dimmed for patient comfort. Will continue to monitor for further patient needs.

## 2017-05-15 NOTE — ED Provider Notes (Signed)
[facility] Emergency Department Provider Note ____________________________________________   First MD Initiated Contact with Patient 05/15/17 [phone]     (approximate)  I have reviewed the triage vital signs and the nursing notes.   HISTORY  Chief Complaint Abdominal Pain    HPI Cory Campbell is a 37 y.o. male with past medical history as noted below including diverticulitis, colostomy, and chronic abdominal pain, who is now status post surgery (colostomy takedown with colorectal anastomosis, splenic flexure mobilization, loop ileostomy, wound VAC placement) on 3/27, who presents with several complaints.  Patient primarily reports recurrent abdominal pain, mainly around the ileostomy site, sharp, and nonradiating.  He also reports pain in the suprapubic area and left groin.  This is associated with decreased urination.  He reports that his blood pressure has been low, and he has been feeling weak and nauseated.  He denies vomiting.  He states that the output from the ileostomy has been liquid.    The patient was previously on Dilaudid chronically but for the last several months has been on buspirone patches.  He has one in place at this time.  He states that he was given a prescription for oxycodone after the surgery but was unable to fill it because it was not approved by his pain management.  The patient also reports that today he noticed swelling to the left side of his tongue, acute onset, and persistent course throughout the day.  It is not worsening at this time.  He denies difficulty swallowing or shortness of breath.      Past Medical History:  Diagnosis Date  . Abrasion or friction burn of foot and toe(s), without mention of infection   . Acid reflux   . Adopted   . Allergic rhinitis, cause unspecified   . Anxiety   . Arthritis   . Arthrogryposis   . Asthma   . Bladder wall thickening   . BPH (benign prostatic hyperplasia)   . Chronic pain  syndrome   . Depressive disorder, not elsewhere classified   . Diverticulitis of colon (without mention of hemorrhage)(562.11)   . ED (erectile dysfunction)   . Herpes genitalis   . History of echocardiogram    a. 10/2016: EF 50-55%, normal wall motion  . History of stress test    a. 06/2011: significant GI uptake artifact, no evidence of ischemia, EF 56%  . HIV infection (HCC)   . HTN (hypertension)   . Hyperlipidemia   . Hypogonadism in male   . Myalgia and myositis, unspecified   . OAB (overactive bladder)   . Obesity   . Other psoriasis   . Premature baby    born premature  . Seizure disorder (HCC)   . Sleep apnea   . 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 or other cause, without mention of thyrotoxic crisis or storm    hyperthyroidism  . TIA (transient ischemic attack)   . Type II or unspecified type diabetes mellitus with neurological manifestations, not stated as uncontrolled(250.60)     Patient Active Problem List   Diagnosis Date Noted  . Abdominal pain 11/25/2016  . Hypotension 10/08/2016  . Unstable angina (HCC) 10/06/2016  . Shortness of breath 06/23/2015  . Obesity 05/18/2014  . Frequent falls 08/12/2013  . Pain of left clavicle 08/12/2013  . Tachycardia 01/21/2013  . Obstructive sleep apnea on CPAP 06/07/2012  . Chest pain 06/20/2011  . Uncontrolled hypertension 06/20/2011  . Diabetes mellitus (HCC) 06/20/2011    Past Surgical History:  Procedure Laterality Date  . APPENDECTOMY    . COLOSTOMY    . CORNEAL TRANSPLANT    . feet surgery     both  . HAND SURGERY     left and right  . leg surgery     left and right  . ORTHOPEDIC SURGERY     hands, feet, knees, legs  . tubes in ears     both    Prior to Admission medications   Medication Sig Start Date End Date Taking? Authorizing Provider  acyclovir (ZOVIRAX) 400 MG tablet Take 400 mg by mouth 2 (two) times daily.    [provider]  albuterol (PROVENTIL HFA;VENTOLIN HFA) 108 (90 BASE)  MCG/ACT inhaler Inhale 2 puffs into the lungs every 4 (four) hours as needed for wheezing or shortness of breath.     [provider]  albuterol (PROVENTIL) (2.5 MG/3ML) 0.083% nebulizer solution Take 2.5 mg by nebulization every 4 (four) hours as needed for wheezing or shortness of breath.    [provider]  alfuzosin (UROXATRAL) 10 MG 24 hr tablet Take 10 mg by mouth daily.    [provider]  amLODipine (NORVASC) 10 MG tablet Take 10 mg by mouth daily.    [provider]  aspirin 81 MG tablet Take 81 mg by mouth daily.    [provider]  BIKTARVY 50-200-25 MG TABS tablet Take 1 tablet by mouth daily. 11/21/16   [provider]  Buprenorphine (BUTRANS) 15 MCG/HR PTWK Place 15 mg onto the skin once a week. Saturday    [provider]  buPROPion (WELLBUTRIN XL) 150 MG 24 hr tablet Take 1 tablet by mouth daily. 11/16/16   [provider]  chlorthalidone (HYGROTON) 25 MG tablet Take 1 tablet by mouth daily. 11/15/16   [provider]  citalopram (CELEXA) 20 MG tablet Take 20 mg by mouth daily.    [provider]  cloNIDine (CATAPRES - DOSED IN MG/24 HR) 0.1 mg/24hr patch Place 0.1 mg onto the skin once a week.    [provider]  diclofenac sodium (VOLTAREN) 1 % GEL Apply 2-4 g topically 4 (four) times daily as needed. For pain.    [provider]  dicyclomine (BENTYL) 20 MG tablet Take 20 mg by mouth every 6 (six) hours.    [provider]  DOCOSAHEXAENOIC ACID PO Take 1 tablet by mouth daily.     [provider]  famotidine (PEPCID) 20 MG tablet Take 20 mg by mouth 2 (two) times daily.  01/03/16   [provider]  finasteride (PROSCAR) 5 MG tablet Take 5 mg by mouth daily.    [provider]  fluticasone (FLONASE) 50 MCG/ACT nasal spray Place 2 sprays into the nose daily.    [provider]  hydrocortisone cream 0.5 % Apply 1 application topically  2 (two) times daily.    [provider]  insulin degludec (TRESIBA FLEXTOUCH) 100 UNIT/ML SOPN FlexTouch Pen Inject 120 Units into the skin daily at 10 pm.     [provider]  ketoconazole (NIZORAL) 2 % shampoo Apply 1 application topically 2 (two) times a week. tues and thursday 08/27/14   [provider]  lisinopril (PRINIVIL,ZESTRIL) 40 MG tablet Take 40 mg by mouth daily.    [provider]  loratadine (CLARITIN) 10 MG tablet Take 10 mg by mouth daily.    [provider]  metoprolol tartrate (LOPRESSOR) 100 MG tablet Take 100 mg by mouth 2 (two)  times daily.    [provider]  mirtazapine (REMERON) 45 MG tablet Take 45 mg by mouth at bedtime.    [provider]  Multiple Vitamin (MULTIVITAMIN) tablet Take 1 tablet by mouth daily.    [provider]  naloxone [facility](NARCAN) nasal spray 4 mg/0.1 mL One spray in either nostril once for known/suspected opioid overdose. May repeat every 2-3 minutes in alternating nostril til EMS arrives 01/12/16   [provider]  nitroGLYCERIN (NITROSTAT) 0.4 MG SL tablet DISSOLVE 1 TABLET UNDER THE TONGUE EVERY 5 MINUTES AS NEEDED FOR CHEST PAIN. CALL 911 IF CHEST IS NOT RESOLVED WITH 3 TABLETS 04/23/17   Gollan, Tollie Pizzaimothy J, MD  NON FORMULARY Glycerna BID.    [provider]  nortriptyline (PAMELOR) 25 MG capsule Take 50 mg by mouth at bedtime.    [provider]  NOVOLOG FLEXPEN 100 UNIT/ML FlexPen Inject 10 Units into the skin 3 (three) times daily with meals. With every meal and every snack 12/13/15   [provider]  omeprazole (PRILOSEC) 20 MG capsule Take 20 mg by mouth daily.    [provider]  ondansetron (ZOFRAN-ODT) 4 MG disintegrating tablet Take 4 mg by mouth every 8 (eight) hours as needed for nausea or vomiting.    [provider]  polyethylene glycol (MIRALAX / GLYCOLAX) packet Take 17 g by mouth 3 (three) times daily.     [provider]  potassium chloride SA (K-DUR,KLOR-CON) 20 MEQ tablet take 1 tablet by mouth once daily if needed Patient taking differently: take 1 tablet by mouth once daily 02/08/16   Antonieta IbaGollan, Timothy J, MD  pravastatin (PRAVACHOL) 80 MG tablet take 1 tablet by mouth at bedtime 10/10/16   Antonieta IbaGollan, Timothy J, MD  prednisoLONE acetate (PRED FORTE) 1 % ophthalmic suspension Place 1 drop into the right eye daily.     [provider]  RA MELATONIN 1 MG SUBL Take 1 mg by mouth at bedtime.  12/27/15   [provider]  solifenacin (VESICARE) 10 MG tablet Take 10 mg by mouth daily.    [provider]  zolpidem (AMBIEN) 10 MG tablet Take 10 mg by mouth at bedtime.    [provider]    Allergies Penicillins; Erythromycin base; and Erythromycin base  Family History  Family history unknown: Yes    Social History Social History   Tobacco Use  . Smoking status: Current Every Day Smoker    Years: 0.00    Types: Pipe  . Smokeless tobacco: Never Used  . Tobacco comment: quit smoking friday 08/18/2016  Substance Use Topics  . Alcohol use: No    Alcohol/week: 0.0 oz    Comment: used to be moderate  . Drug use: No    Review of Systems  Constitutional: No fever.  Positive for generalized weakness. Eyes: No redness. ENT: No sore throat. Cardiovascular: Denies chest pain. Respiratory: Denies shortness of breath. Gastrointestinal: Positive for nausea.  Genitourinary: Negative for dysuria.  Musculoskeletal: Negative for back pain. Skin: Negative for rash. Neurological: Negative for headache.   ____________________________________________   PHYSICAL EXAM:  VITAL SIGNS: ED Triage Vitals  Enc Vitals Group     BP 05/15/17 1602 (!) 88/70     Pulse Rate 05/15/17 1602 (!) 114     Resp 05/15/17 1602 (!) 22     Temp 05/15/17 1602 98.3 F (36.8 C)     Temp Source 05/15/17 1602 Oral     SpO2 05/15/17 1558 98 %  Weight 05/15/17 1602 175 lb (79.4 kg)      Height 05/15/17 1602 5\' 3"  (1.6 m)     Head Circumference --      Peak Flow --      Pain Score 05/15/17 1601 10     Pain Loc --      Pain Edu? --      Excl. in GC? --     Constitutional: Alert and oriented.  Relatively comfortable appearing and in no acute distress. Eyes: Conjunctivae are normal.  No scleral icterus. Head: Atraumatic. Nose: No congestion/rhinnorhea. Mouth/Throat: Mucous membranes are dry.   Neck: Normal range of motion.  Cardiovascular: Normal rate, regular rhythm. Grossly normal heart sounds.  Good peripheral circulation. Respiratory: Normal respiratory effort.  No retractions. Lungs CTAB. Gastrointestinal: Soft with minimal tenderness around ileostomy site.  Surgical area and wound VAC are clean/dry/intact.  No distention.  Genitourinary: No CVA or flank tenderness.  Mild left suprapubic tenderness. Musculoskeletal: No lower extremity edema.  Extremities warm and well perfused.  Neurologic:  Normal speech and language. No gross focal neurologic deficits are appreciated.  Skin:  Skin is warm and dry. No rash noted. Psychiatric: Mood and affect are normal. Speech and behavior are normal.  ____________________________________________   LABS (all labs ordered are listed, but only abnormal results are displayed)  Labs Reviewed  COMPREHENSIVE METABOLIC PANEL - Abnormal; Notable for the following components:      Result Value   Sodium 125 (*)    Chloride 91 (*)    CO2 19 (*)    Glucose, Bld 164 (*)    BUN 54 (*)    Creatinine, Ser 4.76 (*)    Total Protein 9.0 (*)    GFR calc non Af Amer 14 (*)    GFR calc Af Amer 17 (*)    All other components within normal limits  CBC WITH DIFFERENTIAL/PLATELET - Abnormal; Notable for the following components:   WBC 12.9 (*)    RBC 4.03 (*)    Hemoglobin 11.3 (*)    HCT 34.0 (*)    Platelets 870 (*)    Neutro Abs 11.0 (*)    All other components within normal limits  LIPASE, BLOOD  LACTIC ACID, PLASMA  URINALYSIS,  COMPLETE (UACMP) WITH MICROSCOPIC  LACTIC ACID, PLASMA  TROPONIN I   ____________________________________________  EKG  ED ECG REPORT I, Dionne Bucy, the attending physician, personally viewed and interpreted this ECG.  Date: 05/15/2017 EKG Time: 1623 Rate: 112 Rhythm: Sinus tachycardia QRS Axis: normal Intervals: normal ST/T Wave abnormalities: Repolarization abnormality with lateral T wave inversions Narrative Interpretation: Nonspecific findings; new lateral T wave inversions when compared to EKG of 04/17/2017  ____________________________________________  RADIOLOGY  CT abdomen: Extensive left-sided colitis with possible small foci of gas, inflammatory versus infectious versus postoperative  ____________________________________________   PROCEDURES  Procedure(s) performed: No  Procedures  Critical Care performed: No ____________________________________________   INITIAL IMPRESSION / ASSESSMENT AND PLAN / ED COURSE  Pertinent labs & imaging results that were available during my care of the patient were reviewed by me and considered in my medical decision making (see chart for details).  37 year old male with past medical history as noted above and status post colostomy reversal and ileostomy on 3/27 at [facility] presents with recurrent abdominal pain around the ileostomy site, suprapubic pain, liquid output from the ileostomy, and generalized weakness and nausea.  He also has swelling of the left side of his tongue.  I reviewed the past medical records in  Epic and Care Everywhere and confirmed the prior surgical history.  Patient had multiple prior ED visits especially last fall for chronic abdominal pain, but has not had such a visit in the last month.  Per his PMP record, patient has been on buprenorphine since last year, and his last prescription for Dilaudid was in September of last year.  On exam, the patient is hypotensive and tachycardic although during EMS  transport his vital signs were apparently within normal limits.  He is relatively comfortable appearing.  The remainder the exam is as described above.  1.  Abdominal pain: Differential includes routine postoperative pain, especially given that the patient was unable to fill his pain medication, exacerbation of patient's chronic pain, versus postoperative complication such as obstruction or hematoma.  Also consider UTI/cystitis given the suprapubic pain and urinary symptoms.  We will obtain labs, UA, and CT abdomen.  2.  Abnormal vital signs: I suspect most likely dehydration given that the patient appears overall dehydrated and reports liquid ileostomy output and nausea.  Will obtain lactate and workup for infection/sepsis as well.  3.  Tongue swelling: Tongue swelling is asymmetrical and is most consistent with angioedema.  The patient is on lisinopril.  At this time there is no evidence of airway compromise, and the swelling appears to be stable.  There is no swelling of the posterior oropharynx.  I will give Benadryl, steroid, and observe.    ----------------------------------------- 7:48 PM on 05/15/2017 -----------------------------------------  Patient's lab workup reveals new onset acute renal insufficiency and hyponatremia.  His lactic acid is not elevated.  There is no evidence of sepsis.  Given the acute renal insufficiency she will require admission.  I suspect most likely dehydration as the primary etiology.  Fluids have been given.  CT shows evidence of fairly extensive colitis and possible small foci of gas which may be postoperative changes.  I called the [facility] transfer center and discussed the CT findings and the clinical presentation with the surgeon who performed the operation on 05/02/2017, Dr. Lennart Pall, who advised that these findings were consistent with normal postoperative changes given the extensive nature of his surgery, and that no acute surgical intervention or transfer was  indicated at this time.  Infectious colitis is still a possibility, so antibiotics have been given.  Per the surgeon, patient is appropriate for admission to medicine.  We will admit the patient here.    ----------------------------------------- 8:18 PM on 05/15/2017 -----------------------------------------  I signed the patient out to the hospitalist Dr. Elisabeth Pigeon. ____________________________________________   FINAL CLINICAL IMPRESSION(S) / ED DIAGNOSES  Final diagnoses:  Acute renal insufficiency  Hyponatremia  Dehydration  Colitis      NEW MEDICATIONS STARTED DURING THIS VISIT:  New Prescriptions   No medications on file     Note:  This document was prepared using Dragon voice recognition software and may include unintentional dictation errors.    Dionne Bucy, MD 05/15/17 2018

## 2017-05-15 NOTE — ED Notes (Signed)
Patient transported to CT 

## 2017-05-16 DIAGNOSIS — K529 Noninfective gastroenteritis and colitis, unspecified: Secondary | ICD-10-CM

## 2017-05-16 LAB — GASTROINTESTINAL PANEL BY PCR, STOOL (REPLACES STOOL CULTURE)
Adenovirus F40/41: NOT DETECTED
Astrovirus: NOT DETECTED
CYCLOSPORA CAYETANENSIS: NOT DETECTED
Campylobacter species: NOT DETECTED
Cryptosporidium: NOT DETECTED
ENTAMOEBA HISTOLYTICA: NOT DETECTED
ENTEROAGGREGATIVE E COLI (EAEC): NOT DETECTED
Enteropathogenic E coli (EPEC): NOT DETECTED
Enterotoxigenic E coli (ETEC): NOT DETECTED
GIARDIA LAMBLIA: NOT DETECTED
Norovirus GI/GII: NOT DETECTED
Plesimonas shigelloides: NOT DETECTED
Rotavirus A: NOT DETECTED
SALMONELLA SPECIES: NOT DETECTED
SAPOVIRUS (I, II, IV, AND V): NOT DETECTED
SHIGELLA/ENTEROINVASIVE E COLI (EIEC): NOT DETECTED
Shiga like toxin producing E coli (STEC): NOT DETECTED
VIBRIO CHOLERAE: NOT DETECTED
VIBRIO SPECIES: NOT DETECTED
Yersinia enterocolitica: NOT DETECTED

## 2017-05-16 LAB — CBC
HCT: 32.7 % — ABNORMAL LOW (ref 40.0–52.0)
HCT: 31.8 % — ABNORMAL LOW (ref 40.0–52.0)
HEMOGLOBIN: 10.6 g/dL — AB (ref 13.0–18.0)
Hemoglobin: 10.5 g/dL — ABNORMAL LOW (ref 13.0–18.0)
MCH: 27.4 pg (ref 26.0–34.0)
MCH: 27.7 pg (ref 26.0–34.0)
MCHC: 32.4 g/dL (ref 32.0–36.0)
MCHC: 32.9 g/dL (ref 32.0–36.0)
MCV: 84.7 fL (ref 80.0–100.0)
MCV: 84.4 fL (ref 80.0–100.0)
PLATELETS: 801 10*3/uL — AB (ref 150–440)
PLATELETS: 805 10*3/uL — AB (ref 150–440)
RBC: 3.77 MIL/uL — ABNORMAL LOW (ref 4.40–5.90)
RBC: 3.86 MIL/uL — AB (ref 4.40–5.90)
RDW: 13.9 % (ref 11.5–14.5)
RDW: 14 % (ref 11.5–14.5)
WBC: 7.3 10*3/uL (ref 3.8–10.6)
WBC: 8.9 10*3/uL (ref 3.8–10.6)

## 2017-05-16 LAB — GLUCOSE, CAPILLARY
GLUCOSE-CAPILLARY: 229 mg/dL — AB (ref 65–99)
GLUCOSE-CAPILLARY: 161 mg/dL — AB (ref 65–99)
Glucose-Capillary: 188 mg/dL — ABNORMAL HIGH (ref 65–99)

## 2017-05-16 LAB — TROPONIN I: Troponin I: <0.03 ng/mL (ref <0.03)

## 2017-05-16 LAB — LACTIC ACID, PLASMA: Lactic Acid, Venous: 1.4 mmol/L (ref 0.5–1.9)

## 2017-05-16 LAB — BASIC METABOLIC PANEL
Anion gap: 11 (ref 5–15)
BUN: 48 mg/dL — AB (ref 6–20)
CALCIUM: 8.6 mg/dL — AB (ref 8.9–10.3)
CO2: 18 mmol/L — ABNORMAL LOW (ref 22–32)
CREATININE: 2.32 mg/dL — AB (ref 0.61–1.24)
Chloride: 101 mmol/L (ref 101–111)
GFR calc Af Amer: 40 mL/min — ABNORMAL LOW (ref >60)
GFR, EST NON AFRICAN AMERICAN: 34 mL/min — AB (ref >60)
Glucose, Bld: 256 mg/dL — ABNORMAL HIGH (ref 65–99)
POTASSIUM: 5.1 mmol/L (ref 3.5–5.1)
SODIUM: 130 mmol/L — AB (ref 135–145)

## 2017-05-16 LAB — C DIFFICILE QUICK SCREEN W PCR REFLEX
C Diff antigen: NEGATIVE
C Diff interpretation: NOT DETECTED
C Diff toxin: NEGATIVE

## 2017-05-16 LAB — CREATININE, SERUM
CREATININE: 3.02 mg/dL — AB (ref 0.61–1.24)
GFR, EST AFRICAN AMERICAN: 29 mL/min — AB (ref >60)
GFR, EST NON AFRICAN AMERICAN: 25 mL/min — AB (ref >60)

## 2017-05-16 MED ORDER — BICTEGRAVIR-EMTRICITAB-TENOFOV 50-200-25 MG PO TABS
1.0000 | ORAL_TABLET | Freq: Every day | ORAL | Status: DC
  Administered 2017-05-16 – 2017-05-18 (×3): 1 via ORAL
  Filled 2017-05-16 (×3): qty 1

## 2017-05-16 MED ORDER — BICTEGRAVIR-EMTRICITAB-TENOFOV 50-200-25 MG PO TABS
1.0000 | ORAL_TABLET | ORAL | Status: DC
  Filled 2017-05-16: qty 1

## 2017-05-16 MED ORDER — ACYCLOVIR 200 MG PO CAPS
400.0000 mg | ORAL_CAPSULE | Freq: Two times a day (BID) | ORAL | Status: DC
  Administered 2017-05-16 – 2017-05-18 (×6): 400 mg via ORAL
  Filled 2017-05-16 (×7): qty 2

## 2017-05-16 MED ORDER — CIPROFLOXACIN IN D5W 400 MG/200ML IV SOLN
400.0000 mg | Freq: Two times a day (BID) | INTRAVENOUS | Status: DC
  Administered 2017-05-17 – 2017-05-18 (×4): 400 mg via INTRAVENOUS
  Filled 2017-05-16 (×6): qty 200

## 2017-05-16 NOTE — Consult Note (Signed)
Patient ID: Cory Campbell Iacovelli, male   DOB: 03-Jun-1980, 37 y.o.   MRN: [id number]  HPI Cory Campbell Plemmons is a 37 y.o. male asked to see in consultation by Dr. Tobi BastosPyreddy.  Had a recent history of colostomy takedown with loop ileostomy and [facility] a previous Hartman[facility] procedure.  He came last night to the hospital with dehydration and likely ileus.  I have personally reviewed his CT scan and there is some inflammatory changes around the colon and around the anastomosis.  But there is no definitive evidence of abscess or anastomotic leak.  And there is the loop ileostomy and right now will stay there is too fresh to call a true parastomal hernia.  There is no evidence of free air or obstruction.  There is some dilation of the small bowel loops and also the stomach is distended. There is a be having some intermittent abdominal pain and decreased p.o. Intake. No fevers or chills.  Tetanus 2.3 hemoglobin is 10.5 and white count is 7 platelets is 805,000   HPI  Past Medical History:  Diagnosis Date  . Abrasion or friction burn of foot and toe(s), without mention of infection   . Acid reflux   . Adopted   . Allergic rhinitis, cause unspecified   . Anxiety   . Arthritis   . Arthrogryposis   . Asthma   . Bladder wall thickening   . BPH (benign prostatic hyperplasia)   . Chronic pain syndrome   . Depressive disorder, not elsewhere classified   . Diverticulitis of colon (without mention of hemorrhage)(562.11)   . ED (erectile dysfunction)   . Herpes genitalis   . History of echocardiogram    a. 10/2016: EF 50-55%, normal wall motion  . History of stress test    a. 06/2011: significant GI uptake artifact, no evidence of ischemia, EF 56%  . HIV infection (HCC)   . HTN (hypertension)   . Hyperlipidemia   . Hypogonadism in male   . Myalgia and myositis, unspecified   . OAB (overactive bladder)   . Obesity   . Other psoriasis   . Premature baby    born premature  . Seizure  disorder (HCC)   . Sleep apnea   . 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 or other cause, without mention of thyrotoxic crisis or storm    hyperthyroidism  . TIA (transient ischemic attack)   . Type II or unspecified type diabetes mellitus with neurological manifestations, not stated as uncontrolled(250.60)     Past Surgical History:  Procedure Laterality Date  . APPENDECTOMY    . COLOSTOMY    . CORNEAL TRANSPLANT    . feet surgery     both  . HAND SURGERY     left and right  . leg surgery     left and right  . ORTHOPEDIC SURGERY     hands, feet, knees, legs  . tubes in ears     both    Family History  Family history unknown: Yes    Social History Social History   Tobacco Use  . Smoking status: Current Every Day Smoker    Years: 0.00    Types: Pipe  . Smokeless tobacco: Never Used  . Tobacco comment: quit smoking friday 08/18/2016  Substance Use Topics  . Alcohol use: No    Alcohol/week: 0.0 oz    Comment: used to be moderate  . Drug use: No    Allergies  Allergen Reactions  .  Penicillins Hives, Itching and Rash    Has patient had a PCN reaction causing immediate rash, facial/tongue/throat swelling, SOB or lightheadedness with hypotension: Yes Has patient had a PCN reaction causing severe rash involving mucus membranes or skin necrosis: No Has patient had a PCN reaction that required hospitalization No Has patient had a PCN reaction occurring within the last 10 years: No If all of the above answers are "NO", then may proceed with Cephalosporin use.   . Erythromycin Base Itching and Rash    Current Facility-Administered Medications  Medication Dose Route Frequency Provider Last Rate Last Dose  . 0.9 %  sodium chloride infusion   Intravenous Continuous Altamese Dilling, MD 100 mL/hr at 05/16/17 1014    . acyclovir (ZOVIRAX) 200 MG capsule 400 mg  400 mg Oral Q12H Altamese Dilling, MD   400 mg at 05/16/17 1007  . albuterol (PROVENTIL) (2.5  MG/3ML) 0.083% nebulizer solution 2.5 mg  2.5 mg Inhalation Q4H PRN Altamese Dilling, MD      . alfuzosin (UROXATRAL) 24 hr tablet 10 mg  10 mg Oral Daily Altamese Dilling, MD   10 mg at 05/16/17 1007  . aspirin EC tablet 81 mg  81 mg Oral Daily Altamese Dilling, MD   81 mg at 05/16/17 1008  . bictegravir-emtricitabine-tenofovir AF (BIKTARVY) 50-200-25 MG per tablet 1 tablet  1 tablet Oral Daily Arnaldo Natal, MD   1 tablet at 05/16/17 1007  . buPROPion (WELLBUTRIN XL) 24 hr tablet 150 mg  150 mg Oral Daily Altamese Dilling, MD   150 mg at 05/16/17 1007  . ciprofloxacin (CIPRO) IVPB 400 mg  400 mg Intravenous Q12H Altamese Dilling, MD      . citalopram (CELEXA) tablet 20 mg  20 mg Oral Daily Altamese Dilling, MD   20 mg at 05/16/17 1009  . dicyclomine (BENTYL) tablet 20 mg  20 mg Oral Q6H Altamese Dilling, MD   20 mg at 05/16/17 1008  . docusate sodium (COLACE) capsule 100 mg  100 mg Oral BID PRN Altamese Dilling, MD      . famotidine (PEPCID) tablet 20 mg  20 mg Oral Daily Altamese Dilling, MD   20 mg at 05/16/17 1008  . finasteride (PROSCAR) tablet 5 mg  5 mg Oral Daily Altamese Dilling, MD   5 mg at 05/16/17 1007  . fluticasone (FLONASE) 50 MCG/ACT nasal spray 2 spray  2 spray Each Nare Daily Altamese Dilling, MD      . heparin injection 5,000 Units  5,000 Units Subcutaneous Q8H Altamese Dilling, MD   5,000 Units at 05/16/17 0809  . insulin aspart (novoLOG) injection 0-9 Units  0-9 Units Subcutaneous TID WC Altamese Dilling, MD   3 Units at 05/16/17 0810  . metroNIDAZOLE (FLAGYL) IVPB 500 mg  500 mg Intravenous Q8H Altamese Dilling, MD   Stopped at 05/16/17 0749  . multivitamin with minerals tablet 1 tablet  1 tablet Oral Daily Altamese Dilling, MD   1 tablet at 05/16/17 1007  . nortriptyline (PAMELOR) capsule 50 mg  50 mg Oral QHS Altamese Dilling, MD   50 mg at 05/16/17 0154  . ondansetron  (ZOFRAN-ODT) disintegrating tablet 4 mg  4 mg Oral Q8H PRN Altamese Dilling, MD   4 mg at 05/16/17 0155  . oxyCODONE (Oxy IR/ROXICODONE) immediate release tablet 5 mg  5 mg Oral Q6H PRN Oralia Manis, MD   5 mg at 05/16/17 0027  . polyethylene glycol (MIRALAX / GLYCOLAX) packet 17 g  17 g Oral TID Elisabeth Pigeon,  Heath Gold, MD   17 g at 05/16/17 1007  . potassium chloride SA (K-DUR,KLOR-CON) CR tablet 20 mEq  20 mEq Oral Daily Altamese Dilling, MD   20 mEq at 05/16/17 1007  . pravastatin (PRAVACHOL) tablet 80 mg  80 mg Oral QHS Altamese Dilling, MD         Review of Systems Full ROS  was asked and was negative except for the information on the HPI  Physical Exam Blood pressure 136/83, pulse (!) 117, temperature 98.2 F (36.8 C), temperature source Oral, resp. rate 18, height 5\' 3"  (1.6 m), weight 79.4 kg (175 lb), SpO2 94 %. CONSTITUTIONAL: NAD EYES: Pupils are equal, round, and reactive to light, Sclera are non-icteric. EARS, NOSE, MOUTH AND THROAT: The oropharynx is clear. The oral mucosa is pink and moist. Hearing is intact to voice. LYMPH NODES:  Lymph nodes in the neck are normal. RESPIRATORY:  Lungs are clear. There is normal respiratory effort, with equal breath sounds bilaterally, and without pathologic use of accessory muscles. CARDIOVASCULAR: Heart is regular without murmurs, gallops, or rubs. GI: The abdomen is soft, ileostomy on LLQ, midline wound vac. No peritonitis or active infection. GU: Rectal deferred.   MUSCULOSKELETAL: Normal muscle strength and tone. No cyanosis or edema.   SKIN: Turgor is good and there are no pathologic skin lesions or ulcers. NEUROLOGIC: Motor and sensation is grossly normal. Cranial nerves are grossly intact. PSYCH:  Oriented to person, place and time. Affect is normal.  Data Reviewed  I have personally reviewed the patient's imaging, laboratory findings and medical records.    Assessment  /Plan 37 year old HIV patient that is  debilitated and underwent a colostomy takedown with a loop ileostomy and a wound VAC placement at [facility] 2 weeks ago.  Now he seems to have slight ileus and some wound care issues.  No evidence of definitive wound infection no evidence of a major intra-abdominal catastrophe.  We recommend continue antibiotic therapy, may start some clear liquid diet only and will continue to follow.  Wound care nurse has already addressed his needs and I appreciate her help.  No need for any emergent surgical intervention at this time We will continue to follow him  Sterling Big, MD FACS General Surgeon 05/16/2017, 11:57 AM

## 2017-05-16 NOTE — Care Management (Signed)
Patient active with [facility] 

## 2017-05-16 NOTE — Consult Note (Addendum)
WOC Nurse ostomy consult note Stoma type/location: LLQ ile[facility] DOS 05/02/17. Stomal assessment/size: oval, 1 and 3/8 inches x 1 and 3/4 inches, slightly budded, red, moist. Os at center. Peristomal assessment: mild contact dermatitis, erythema, pinpoint openings in immediate parastomal field. Treatment options for stomal/peristomal skin: skin barrier ring, convexity Output: brown. thin effluent Ostomy pouching: 1pc.convex ostomy pouching system with skin barrier ring.  Education provided: Patient is taught that we will try a different pouching system than the 2-piece flat as he is having repeated leakages [facility].  Enrolled patient in [employer] program: No   WOC Nurse wound consult note Reason for Consult: Home NPWT device in place and is due to be changed today. Is leaking.  Patient reports that ileostomy frequently leaks into NPWT dressing. Wound type:Surgical, midline. Healing. Two wounds, supraumbilical and inferior to the umbilicus. Pressure Injury POA: NA Measurement: Proximal:11cm x 3cm x 0.8cm with red, moist wound bed and scant serous exudate.  No odor. Distal:7cm x 1.5cm x 0.2cm with red, moist wound bed, serosanguinous exudate following dressing removal (stops easily with gentle pressure), no odor.  This wound transverses a crease in the lower abdomen. Wound bed: As described above Drainage (amount, consistency, odor) As described above Periwound: Intact, clear Dressing procedure/placement/frequency: Old NPWT dressing removed and wound cleansed with NS and gently patted dry. Periwound tissue is prepped with drape and a skin barrier ring is placed around the distal end of wound to enhance seal.  Three pieces of black foam are used to obliterate the dead space and covered with drape.  Attached to negative pressure at 125mmhg and an immediate seal achieved. I am assisted in today's dressing change by bedside RN, Darl PikesSusan  and her help is appreciated.   Both Drs. Pabon and Pyreddy are in to see patient during my visit.  WOC nursing team will follow for NPWT and ostomy pouch changes, and will remain available to this patient, the nursing, surgical and medical teams.   Thanks, Ladona MowLaurie Henleigh Robello, MSN, RN, GNP, Hans EdenCWOCN, CWON-AP, FAAN  Pager# [phone]

## 2017-05-16 NOTE — Progress Notes (Signed)
[employer] - [place] [facility]                                                                                                                                                                                  Patient Demographics   Cory Campbell, is a 37 y.o. male, DOB - 23-Dec-1980, WUJ:[id number]  Admit date - 05/15/2017   Admitting Physician Altamese Dilling, MD  Outpatient Primary MD for the patient is Aycock, Sylvie Farrier, MD   LOS - 1  Subjective: Patient seen and evaluated today Has abdominal discomfort Feels dry and dehydrated No chest pain No shortness of breath  Review of Systems:   CONSTITUTIONAL: No documented fever. Has fatigue, weakness. No weight gain, no weight loss.  EYES: No blurry or double vision.  ENT: No tinnitus. No postnasal drip. No redness of the oropharynx.  RESPIRATORY: No cough, no wheeze, no hemoptysis. No dyspnea.  CARDIOVASCULAR: No chest pain. No orthopnea. No palpitations. No syncope.  GASTROINTESTINAL: No nausea, no vomiting or diarrhea. Has abdominal pain. No melena or hematochezia.  GENITOURINARY: No dysuria or hematuria.  ENDOCRINE: No polyuria or nocturia. No heat or cold intolerance.  HEMATOLOGY: No anemia. No bruising. No bleeding.  INTEGUMENTARY: No rashes. No lesions.  MUSCULOSKELETAL: No arthritis. No swelling. No gout.  NEUROLOGIC: No numbness, tingling, or ataxia. No seizure-type activity.  PSYCHIATRIC: No anxiety. No insomnia. No ADD.    Vitals:   Vitals:   05/15/17 2200 05/15/17 2352 05/16/17 0434 05/16/17 0758  BP: 117/79 117/70 127/80 136/83  Pulse: (!) 109 (!) 103 (!) 117 (!) 117  Resp: 18 18 18 18   Temp:  98.4 F (36.9 C) 98.2 F (36.8 C)   TempSrc:  Oral Oral   SpO2: 97% 100% 98% 94%  Weight:      Height:        Wt Readings from Last 3 Encounters:  05/15/17 79.4 kg (175 lb)  04/17/17 78 kg (172 lb)  01/11/17 84.8 kg (187 lb)     Intake/Output Summary (Last 24 hours) at 05/16/2017 1506 Last  data filed at 05/16/2017 1300 Gross per 24 hour  Intake -  Output 2050 ml  Net -2050 ml    Physical Exam:   GENERAL: Pleasant-appearing male patient in no acute distress.  HEAD, EYES, EARS, NOSE AND THROAT: Atraumatic, normocephalic. Extraocular muscles are intact. Pupils equal and reactive to light. Sclerae anicteric. No conjunctival injection. No oro-pharyngeal erythema.  NECK: Supple. There is no jugular venous distention. No bruits, no lymphadenopathy, no thyromegaly.  HEART: Regular rate and rhythm,. No murmurs, no rubs, no clicks.  LUNGS: Clear to auscultation bilaterally. No rales or rhonchi. No wheezes.  ABDOMEN:  Soft, flat, has abdominal wound, nondistended.  Stomal assessment : 1 and 3/8 inches, slightly budded, red moist Os at center Proximal:11cm x 3cm x 0.8cm with red, moist wound bed and scant serous exudate.  No odor. Distal:7cm x 1.5cm x 0.2cm with red, moist wound bed, serosanguinous exudate following dressing removal (stops easily with gentle pressure), no odor.  This wound transverses a crease in the lower abdomen Has good bowel sounds. No hepatosplenomegaly appreciated.  EXTREMITIES: No evidence of any cyanosis, clubbing, or peripheral edema.  +2 pedal and radial pulses bilaterally.  NEUROLOGIC: The patient is alert, awake, and oriented x3 with no focal motor or sensory deficits appreciated bilaterally.  SKIN: Moist and warm with no rashes appreciated.  Psych: Not anxious, depressed LN: No inguinal LN enlargement    Antibiotics   Anti-infectives (From admission, onward)   Start     Dose/Rate Route Frequency Ordered Stop   05/16/17 2200  ciprofloxacin (CIPRO) IVPB 400 mg  Status:  Discontinued     400 mg 200 mL/hr over 60 Minutes Intravenous Every 24 hours 05/15/17 2152 05/16/17 0511   05/16/17 2200  ciprofloxacin (CIPRO) IVPB 400 mg     400 mg 200 mL/hr over 60 Minutes Intravenous Every 12 hours 05/16/17 0511     05/16/17 1000   bictegravir-emtricitabine-tenofovir AF (BIKTARVY) 50-200-25 MG per tablet 1 tablet  Status:  Discontinued     1 tablet Oral Daily 05/15/17 2354 05/16/17 0319   05/16/17 1000  bictegravir-emtricitabine-tenofovir AF (BIKTARVY) 50-200-25 MG per tablet 1 tablet  Status:  Discontinued     1 tablet Oral every 72 hours 05/16/17 0319 05/16/17 0419   05/16/17 1000  bictegravir-emtricitabine-tenofovir AF (BIKTARVY) 50-200-25 MG per tablet 1 tablet     1 tablet Oral Daily 05/16/17 0419     05/16/17 0015  acyclovir (ZOVIRAX) 200 MG capsule 400 mg     400 mg Oral Every 12 hours 05/16/17 0007     05/16/17 0000  acyclovir (ZOVIRAX) tablet 400 mg  Status:  Discontinued     400 mg Oral 2 times daily 05/15/17 2354 05/16/17 0007   05/15/17 2145  metroNIDAZOLE (FLAGYL) IVPB 500 mg     500 mg 100 mL/hr over 60 Minutes Intravenous Every 8 hours 05/15/17 2133     05/15/17 1930  levofloxacin (LEVAQUIN) IVPB 750 mg     750 mg 100 mL/hr over 90 Minutes Intravenous  Once 05/15/17 1916 05/15/17 2140   05/15/17 1930  metroNIDAZOLE (FLAGYL) IVPB 500 mg     500 mg 100 mL/hr over 60 Minutes Intravenous  Once 05/15/17 1916 05/16/17 0009      Medications   Scheduled Meds: . acyclovir  400 mg Oral Q12H  . alfuzosin  10 mg Oral Daily  . aspirin EC  81 mg Oral Daily  . bictegravir-emtricitabine-tenofovir AF  1 tablet Oral Daily  . buPROPion  150 mg Oral Daily  . citalopram  20 mg Oral Daily  . dicyclomine  20 mg Oral Q6H  . famotidine  20 mg Oral Daily  . finasteride  5 mg Oral Daily  . fluticasone  2 spray Each Nare Daily  . heparin  5,000 Units Subcutaneous Q8H  . insulin aspart  0-9 Units Subcutaneous TID WC  . multivitamin with minerals  1 tablet Oral Daily  . nortriptyline  50 mg Oral QHS  . polyethylene glycol  17 g Oral TID  . potassium chloride SA  20 mEq Oral Daily  . pravastatin  80 mg Oral QHS  Continuous Infusions: . sodium chloride 100 mL/hr at 05/16/17 1014  . ciprofloxacin    . metronidazole  Stopped (05/16/17 1437)   PRN Meds:.albuterol, docusate sodium, ondansetron, oxyCODONE   Data Review:   Micro Results Recent Results (from the past 240 hour(s))  Gastrointestinal Panel by PCR , Stool     Status: None   Collection Time: 05/15/17 10:16 PM  Result Value Ref Range Status   Campylobacter species NOT DETECTED NOT DETECTED Final   Plesimonas shigelloides NOT DETECTED NOT DETECTED Final   Salmonella species NOT DETECTED NOT DETECTED Final   Yersinia enterocolitica NOT DETECTED NOT DETECTED Final   Vibrio species NOT DETECTED NOT DETECTED Final   Vibrio cholerae NOT DETECTED NOT DETECTED Final   Enteroaggregative E coli (EAEC) NOT DETECTED NOT DETECTED Final   Enteropathogenic E coli (EPEC) NOT DETECTED NOT DETECTED Final   Enterotoxigenic E coli (ETEC) NOT DETECTED NOT DETECTED Final   Shiga like toxin producing E coli (STEC) NOT DETECTED NOT DETECTED Final   Shigella/Enteroinvasive E coli (EIEC) NOT DETECTED NOT DETECTED Final   Cryptosporidium NOT DETECTED NOT DETECTED Final   Cyclospora cayetanensis NOT DETECTED NOT DETECTED Final   Entamoeba histolytica NOT DETECTED NOT DETECTED Final   Giardia lamblia NOT DETECTED NOT DETECTED Final   Adenovirus F40/41 NOT DETECTED NOT DETECTED Final   Astrovirus NOT DETECTED NOT DETECTED Final   Norovirus GI/GII NOT DETECTED NOT DETECTED Final   Rotavirus A NOT DETECTED NOT DETECTED Final   Sapovirus (I, II, IV, and V) NOT DETECTED NOT DETECTED Final    Comment: Performed at [facility], [address] [place], [place] [address]  C difficile quick scan w PCR reflex     Status: None   Collection Time: 05/15/17 10:16 PM  Result Value Ref Range Status   C Diff antigen NEGATIVE NEGATIVE Final   C Diff toxin NEGATIVE NEGATIVE Final   C Diff interpretation No C. difficile detected.  Final    Comment: Performed at [facility], [address], [place], [place] [address]    Radiology Reports Ct Abdomen Pelvis Wo  Contrast  Result Date: 05/15/2017 CLINICAL DATA:  Abdominal pain history of ileostomy EXAM: CT ABDOMEN AND PELVIS WITHOUT CONTRAST TECHNIQUE: Multidetector CT imaging of the abdomen and pelvis was performed following the standard protocol without IV contrast. COMPARISON:  CT 12/05/2016, 11/25/2016, 02/26/2016, 12/27/2010 FINDINGS: Lower chest: Lung bases demonstrate no acute consolidation or effusion. Mild dependent atelectasis. Small pericardial effusion. Hepatobiliary: Calcified gallstones. No focal hepatic abnormality or biliary dilatation Pancreas: Unremarkable. No pancreatic ductal dilatation or surrounding inflammatory changes. Spleen: Normal in size without focal abnormality. Adrenals/Urinary Tract: Adrenal glands are unremarkable. Kidneys are normal, without renal calculi, focal lesion, or hydronephrosis. Bladder is unremarkable. Stomach/Bowel: Stomach is moderately enlarged. No evidence for small bowel obstruction. New left lower quadrant ileostomy. Prior sigmoid colectomy. Small moderate fat containing parastomal hernia. Mild edema in the adjacent subcutaneous fat. Takedown of previously noted left lower quadrant colostomy with reanastomosis at the sigmoid colon. Diffuse inflammatory changes within the upper pelvis, anterior abdomen in left upper and lower quadrants. Wall thickening of the splenic flexure, descending colon and sigmoid colon. Possible small foci of extraluminal gas at the junction of the descending and rectosigmoid colon. Thickened small bowel loops in the left hemiabdomen. Vascular/Lymphatic: Diminutive appearing distal abdominal aorta and iliac vessels. Mild aortic atherosclerosis. No significantly enlarged lymph nodes. Reproductive: Prostate is unremarkable. Other: Negative for free air. Small foci of gas within the midline abdomen terrier  abdominal wall in the region of postoperative scarring. There is soft tissue thickening in the region as well is thickening of the underlying rectus.  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 IMPRESSION: 1. Interval takedown of left lower quadrant colostomy with reanastomosis at the rectosigmoid colon and creation of left lower quadrant ileostomy. Diffuse edema and soft tissue inflammatory changes within the left upper and lower quadrants with bowel wall thickening involving the splenic flexure, descending colon and sigmoid colon. Pericolonic soft tissue stranding and edema. Not certain if the findings are related to resolving postoperative change versus acute infectious or inflammatory colitis. There may be small extraluminal gas in the left lower quadrant at the junction of the descending and rectosigmoid colon, either representing small foci of residual operative gas versus small contained perforation. Correlate with surgical history. 2. 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 Edema and soft tissue stranding within the subcutaneous fat adjacent to the ileostomy bowel loop, probably due to postoperative change. Adjacent edema and soft tissue thickening of the midline ventral abdominal wall with thickening of the rectus, small foci of soft tissue gas, and edema in the anterior mesentery, presumably due to recent surgery. 3. Thickened small bowel loops in the left hemiabdomen, likely reactive 4. Gallstones Electronically Signed   By: Jasmine Pang M.D.   On: 05/15/2017 19:05     CBC Recent Labs  Lab 05/15/17 1618 05/16/17 0034 05/16/17 0422  WBC 12.9* 8.9 7.3  HGB 11.3* 10.6* 10.5*  HCT 34.0* 32.7* 31.8*  PLT 870* 801* 805*  MCV 84.4 84.7 84.4  MCH 27.9 27.4 27.7  MCHC 33.1 32.4 32.9  RDW 14.0 13.9 14.0  LYMPHSABS 1.1  --   --   MONOABS 0.8  --   --   EOSABS 0.0  --   --   BASOSABS 0.0  --   --     Chemistries  Recent Labs  Lab 05/15/17 1618 05/16/17 0034 05/16/17 0422  NA 125*  --  130*  K 4.9  --   5.1  CL 91*  --  101  CO2 19*  --  18*  GLUCOSE 164*  --  256*  BUN 54*  --  48*  CREATININE 4.76* 3.02* 2.32*  CALCIUM 9.4  --  8.6*  AST 20  --   --   ALT 18  --   --   ALKPHOS 99  --   --   BILITOT 0.7  --   --    ------------------------------------------------------------------------------------------------------------------ estimated creatinine clearance is 40.6 mL/min (A) (by C-G formula based on SCr of 2.32 mg/dL (H)). ------------------------------------------------------------------------------------------------------------------ No results for input(s): HGBA1C in the last 72 hours. ------------------------------------------------------------------------------------------------------------------ No results for input(s): CHOL, HDL, LDLCALC, TRIG, CHOLHDL, LDLDIRECT in the last 72 hours. ------------------------------------------------------------------------------------------------------------------ No results for input(s): TSH, T4TOTAL, T3FREE, THYROIDAB in the last 72 hours.  Invalid input(s): FREET3 ------------------------------------------------------------------------------------------------------------------ No results for input(s): VITAMINB12, FOLATE, FERRITIN, TIBC, IRON, RETICCTPCT in the last 72 hours.  Coagulation profile No results for input(s): INR, PROTIME in the last 168 hours.  No results for input(s): DDIMER in the last 72 hours.  Cardiac Enzymes Recent Labs  Lab 05/16/17 0034  TROPONINI <0.03   ------------------------------------------------------------------------------------------------------------------ Invalid input(s): POCBNP    Assessment & Plan  37 year old male patient with history of HIV disease is debilitated underwent a colostomy takedown with a loop ileostomy and wound VAC placement at [facility] 2 weeks ago.  Currently under hospitalist  service for dehydration, renal failure.  1.  Acute renal failure secondary to dehydration IV fluid  hydration  2.  Hypotension resolved  3.  History of abdominal surgery with colostomy and loop ileostomy with wound VAC placement Wound care consultation  4.  Abdominal wound from surgery Dressing as per wound care nursing Status post surgery evaluation Continue iv ciprofloxacin abx  5.  HIV disease Obtain information from the patient regarding heart medication Check CD4 count  6.  Type 2 diabetes mellitus Sliding scale coverage with insulin      Code Status Orders  (From admission, onward)        Start     Ordered   05/15/17 2355  Full code  Continuous     05/15/17 2354    Code Status History    Date Active Date Inactive Code Status Order ID Comments User Context   11/25/2016 1327 11/26/2016 1930 Full Code [id number]  Altamese DillingVachhani, Vaibhavkumar, MD Inpatient   10/06/2016 1953 10/10/2016 1804 Full Code [id number]  Ramonita LabGouru, Aruna, MD Inpatient      Time Spent in minutes   36  Greater than 50% of time spent in care coordination and counseling patient regarding the condition and plan of care.   Ihor AustinPavan Pyreddy M.D on 05/16/2017 at 3:06 PM  Between 7am to 6pm - Pager - [phone]  After 6pm go to [email or web address] - Social research officer, [email or web address] [email or web address]  [employer]   Office  [phone]

## 2017-05-16 NOTE — Progress Notes (Signed)
CBG 208 per Thayer Ohmhris NT.

## 2017-05-17 ENCOUNTER — Other Ambulatory Visit: Payer: Self-pay | Admitting: Cardiovascular Disease

## 2017-05-17 DIAGNOSIS — R1010 Upper abdominal pain, unspecified: Secondary | ICD-10-CM

## 2017-05-17 LAB — HELPER T-LYMPH-CD4 (ARMC ONLY)
% CD 4 Pos. Lymph.: 33.1 % (ref 30.8–58.5)
Absolute CD 4 Helper: 232 /uL — ABNORMAL LOW (ref 359–1519)
BASOS: 0 %
Basophils Absolute: 0 10*3/uL (ref 0.0–0.2)
EOS (ABSOLUTE): 0 10*3/uL (ref 0.0–0.4)
Eos: 0 %
HEMATOCRIT: 28.3 % — AB (ref 37.5–51.0)
HEMOGLOBIN: 9.2 g/dL — AB (ref 13.0–17.7)
Immature Grans (Abs): 0 10*3/uL (ref 0.0–0.1)
Immature Granulocytes: 1 %
LYMPHS: 12 %
Lymphocytes Absolute: 0.7 10*3/uL (ref 0.7–3.1)
MCH: 27.7 pg (ref 26.6–33.0)
MCHC: 32.5 g/dL (ref 31.5–35.7)
MCV: 85 fL (ref 79–97)
Monocytes Absolute: 0.5 10*3/uL (ref 0.1–0.9)
Monocytes: 7 %
NEUTROS ABS: 5.1 10*3/uL (ref 1.4–7.0)
Neutrophils: 80 %
Platelets: 798 10*3/uL — ABNORMAL HIGH (ref 150–379)
RBC: 3.32 x10E6/uL — AB (ref 4.14–5.80)
RDW: 13.8 % (ref 12.3–15.4)
WBC: 6.3 10*3/uL (ref 3.4–10.8)

## 2017-05-17 LAB — GLUCOSE, CAPILLARY
GLUCOSE-CAPILLARY: 165 mg/dL — AB (ref 65–99)
Glucose-Capillary: 208 mg/dL — ABNORMAL HIGH (ref 65–99)
Glucose-Capillary: 181 mg/dL — ABNORMAL HIGH (ref 65–99)
Glucose-Capillary: 160 mg/dL — ABNORMAL HIGH (ref 65–99)
Glucose-Capillary: 187 mg/dL — ABNORMAL HIGH (ref 65–99)

## 2017-05-17 LAB — BASIC METABOLIC PANEL
Anion gap: 7 (ref 5–15)
BUN: 24 mg/dL — AB (ref 6–20)
CHLORIDE: 106 mmol/L (ref 101–111)
CO2: 23 mmol/L (ref 22–32)
CREATININE: 0.96 mg/dL (ref 0.61–1.24)
Calcium: 8.7 mg/dL — ABNORMAL LOW (ref 8.9–10.3)
GFR calc Af Amer: >60 mL/min (ref >60)
GFR calc non Af Amer: >60 mL/min (ref >60)
GLUCOSE: 179 mg/dL — AB (ref 65–99)
Potassium: 3.9 mmol/L (ref 3.5–5.1)
Sodium: 136 mmol/L (ref 135–145)

## 2017-05-17 LAB — CBC
HEMATOCRIT: 29.9 % — AB (ref 40.0–52.0)
HEMOGLOBIN: 9.8 g/dL — AB (ref 13.0–18.0)
MCH: 27.8 pg (ref 26.0–34.0)
MCHC: 32.8 g/dL (ref 32.0–36.0)
MCV: 85 fL (ref 80.0–100.0)
Platelets: 743 10*3/uL — ABNORMAL HIGH (ref 150–440)
RBC: 3.52 MIL/uL — ABNORMAL LOW (ref 4.40–5.90)
RDW: 14 % (ref 11.5–14.5)
WBC: 4.7 10*3/uL (ref 3.8–10.6)

## 2017-05-17 MED ORDER — ACETAMINOPHEN 325 MG PO TABS
650.0000 mg | ORAL_TABLET | Freq: Four times a day (QID) | ORAL | Status: DC | PRN
Start: 2017-05-17 — End: 2017-05-18

## 2017-05-17 MED ORDER — BLISTEX MEDICATED EX OINT
TOPICAL_OINTMENT | CUTANEOUS | Status: DC | PRN
  Filled 2017-05-17: qty 6.3

## 2017-05-17 MED ORDER — LIP MEDEX EX OINT
TOPICAL_OINTMENT | CUTANEOUS | Status: DC | PRN
  Filled 2017-05-17: qty 7

## 2017-05-17 NOTE — Progress Notes (Signed)
[facility] - [place] at [facility]                                                                                                                                                                                  Patient Demographics   Cory Campbell, is a 37 y.o. male, DOB - 1980-06-09, ZOX:[id number]  Admit date - 05/15/2017   Admitting Physician Altamese Dilling, MD  Outpatient Primary MD for the patient is Aycock, Sylvie Farrier, MD   LOS - 2  Subjective: Patient seen and evaluated today Could not be discharged home Wound vac could not be arranged Tolerating diet well No fever No abdominal pain  Review of Systems:   CONSTITUTIONAL: No documented fever. No fatigue, weakness. No weight gain, no weight loss.  EYES: No blurry or double vision.  ENT: No tinnitus. No postnasal drip. No redness of the oropharynx.  RESPIRATORY: No cough, no wheeze, no hemoptysis. No dyspnea.  CARDIOVASCULAR: No chest pain. No orthopnea. No palpitations. No syncope.  GASTROINTESTINAL: No nausea, no vomiting or diarrhea. Decreased abdominal pain. No melena or hematochezia.  GENITOURINARY: No dysuria or hematuria.  ENDOCRINE: No polyuria or nocturia. No heat or cold intolerance.  HEMATOLOGY: No anemia. No bruising. No bleeding.  INTEGUMENTARY: No rashes. No lesions.  MUSCULOSKELETAL: No arthritis. No swelling. No gout.  NEUROLOGIC: No numbness, tingling, or ataxia. No seizure-type activity.  PSYCHIATRIC: No anxiety. No insomnia. No ADD.    Vitals:   Vitals:   05/16/17 1945 05/17/17 0345 05/17/17 0737 05/17/17 1656  BP: 136/80 (!) 140/94 (!) 142/101 (!) 138/96  Pulse: (!) 107 97 86 (!) 110  Resp: 18 18 17 19   Temp: 97.8 F (36.6 C) 98.2 F (36.8 C) 98.3 F (36.8 C) 98.3 F (36.8 C)  TempSrc: Oral   Oral  SpO2: 100% 100% 100% 100%  Weight:      Height:        Wt Readings from Last 3 Encounters:  05/15/17 79.4 kg (175 lb)  04/17/17 78 kg (172 lb)  01/11/17 84.8 kg (187 lb)      Intake/Output Summary (Last 24 hours) at 05/17/2017 1948 Last data filed at 05/17/2017 1700 Gross per 24 hour  Intake 4368.33 ml  Output 4825 ml  Net -456.67 ml    Physical Exam:   GENERAL: Middle aged male patient in no apparent distress.  HEAD, EYES, EARS, NOSE AND THROAT: Atraumatic, normocephalic. Extraocular muscles are intact. Pupils equal and reactive to light. Sclerae anicteric. No conjunctival injection. No oro-pharyngeal erythema.  NECK: Supple. There is no jugular venous distention. No bruits, no lymphadenopathy, no thyromegaly.  HEART: Regular rate and rhythm,. No murmurs, no rubs, no clicks.  LUNGS: Clear to auscultation bilaterally. No rales or rhonchi. No wheezes.  ABDOMEN: Soft, flat, nontender, nondistended. Has good bowel sounds. No hepatosplenomegaly appreciated.  Abdominal bandage noted Wound vac noted EXTREMITIES: No evidence of any cyanosis, clubbing, or peripheral edema.  +2 pedal and radial pulses bilaterally.  NEUROLOGIC: The patient is alert, awake, and oriented x3 with no focal motor or sensory deficits appreciated bilaterally.  SKIN: Moist and warm with no rashes appreciated.  Psych: Not anxious, depressed LN: No inguinal LN enlargement    Antibiotics   Anti-infectives (From admission, onward)   Start     Dose/Rate Route Frequency Ordered Stop   05/16/17 2200  ciprofloxacin (CIPRO) IVPB 400 mg  Status:  Discontinued     400 mg 200 mL/hr over 60 Minutes Intravenous Every 24 hours 05/15/17 2152 05/16/17 0511   05/16/17 2200  ciprofloxacin (CIPRO) IVPB 400 mg     400 mg 200 mL/hr over 60 Minutes Intravenous Every 12 hours 05/16/17 0511     05/16/17 1000  bictegravir-emtricitabine-tenofovir AF (BIKTARVY) 50-200-25 MG per tablet 1 tablet  Status:  Discontinued     1 tablet Oral Daily 05/15/17 2354 05/16/17 0319   05/16/17 1000  bictegravir-emtricitabine-tenofovir AF (BIKTARVY) 50-200-25 MG per tablet 1 tablet  Status:  Discontinued     1 tablet Oral  every 72 hours 05/16/17 0319 05/16/17 0419   05/16/17 1000  bictegravir-emtricitabine-tenofovir AF (BIKTARVY) 50-200-25 MG per tablet 1 tablet     1 tablet Oral Daily 05/16/17 0419     05/16/17 0015  acyclovir (ZOVIRAX) 200 MG capsule 400 mg     400 mg Oral Every 12 hours 05/16/17 0007     05/16/17 0000  acyclovir (ZOVIRAX) tablet 400 mg  Status:  Discontinued     400 mg Oral 2 times daily 05/15/17 2354 05/16/17 0007   05/15/17 2145  metroNIDAZOLE (FLAGYL) IVPB 500 mg     500 mg 100 mL/hr over 60 Minutes Intravenous Every 8 hours 05/15/17 2133     05/15/17 1930  levofloxacin (LEVAQUIN) IVPB 750 mg     750 mg 100 mL/hr over 90 Minutes Intravenous  Once 05/15/17 1916 05/15/17 2140   05/15/17 1930  metroNIDAZOLE (FLAGYL) IVPB 500 mg     500 mg 100 mL/hr over 60 Minutes Intravenous  Once 05/15/17 1916 05/16/17 0009      Medications   Scheduled Meds: . acyclovir  400 mg Oral Q12H  . alfuzosin  10 mg Oral Daily  . aspirin EC  81 mg Oral Daily  . bictegravir-emtricitabine-tenofovir AF  1 tablet Oral Daily  . buPROPion  150 mg Oral Daily  . citalopram  20 mg Oral Daily  . dicyclomine  20 mg Oral Q6H  . famotidine  20 mg Oral Daily  . finasteride  5 mg Oral Daily  . fluticasone  2 spray Each Nare Daily  . heparin  5,000 Units Subcutaneous Q8H  . insulin aspart  0-9 Units Subcutaneous TID WC  . multivitamin with minerals  1 tablet Oral Daily  . nortriptyline  50 mg Oral QHS  . polyethylene glycol  17 g Oral TID  . potassium chloride SA  20 mEq Oral Daily  . pravastatin  80 mg Oral QHS   Continuous Infusions: . ciprofloxacin Stopped (05/17/17 0925)  . metronidazole Stopped (05/17/17 1501)   PRN Meds:.albuterol, docusate sodium, ondansetron, oxyCODONE   Data Review:   Micro Results Recent Results (from the past 240 hour(s))  Gastrointestinal Panel by PCR , Stool  Status: None   Collection Time: 05/15/17 10:16 PM  Result Value Ref Range Status   Campylobacter species NOT  DETECTED NOT DETECTED Final   Plesimonas shigelloides NOT DETECTED NOT DETECTED Final   Salmonella species NOT DETECTED NOT DETECTED Final   Yersinia enterocolitica NOT DETECTED NOT DETECTED Final   Vibrio species NOT DETECTED NOT DETECTED Final   Vibrio cholerae NOT DETECTED NOT DETECTED Final   Enteroaggregative E coli (EAEC) NOT DETECTED NOT DETECTED Final   Enteropathogenic E coli (EPEC) NOT DETECTED NOT DETECTED Final   Enterotoxigenic E coli (ETEC) NOT DETECTED NOT DETECTED Final   Shiga like toxin producing E coli (STEC) NOT DETECTED NOT DETECTED Final   Shigella/Enteroinvasive E coli (EIEC) NOT DETECTED NOT DETECTED Final   Cryptosporidium NOT DETECTED NOT DETECTED Final   Cyclospora cayetanensis NOT DETECTED NOT DETECTED Final   Entamoeba histolytica NOT DETECTED NOT DETECTED Final   Giardia lamblia NOT DETECTED NOT DETECTED Final   Adenovirus F40/41 NOT DETECTED NOT DETECTED Final   Astrovirus NOT DETECTED NOT DETECTED Final   Norovirus GI/GII NOT DETECTED NOT DETECTED Final   Rotavirus A NOT DETECTED NOT DETECTED Final   Sapovirus (I, II, IV, and V) NOT DETECTED NOT DETECTED Final    Comment: Performed at [facility], [address] [place], [place] [address]  C difficile quick scan w PCR reflex     Status: None   Collection Time: 05/15/17 10:16 PM  Result Value Ref Range Status   C Diff antigen NEGATIVE NEGATIVE Final   C Diff toxin NEGATIVE NEGATIVE Final   C Diff interpretation No C. difficile detected.  Final    Comment: Performed at [facility], [address] [place], [place] [address]    Radiology Reports Ct Abdomen Pelvis Wo Contrast  Result Date: 05/15/2017 CLINICAL DATA:  Abdominal pain history of ileostomy EXAM: CT ABDOMEN AND PELVIS WITHOUT CONTRAST TECHNIQUE: Multidetector CT imaging of the abdomen and pelvis was performed following the standard protocol without IV contrast. COMPARISON:  CT 12/05/2016, 11/25/2016, 02/26/2016, 12/27/2010  FINDINGS: Lower chest: Lung bases demonstrate no acute consolidation or effusion. Mild dependent atelectasis. Small pericardial effusion. Hepatobiliary: Calcified gallstones. No focal hepatic abnormality or biliary dilatation Pancreas: Unremarkable. No pancreatic ductal dilatation or surrounding inflammatory changes. Spleen: Normal in size without focal abnormality. Adrenals/Urinary Tract: Adrenal glands are unremarkable. Kidneys are normal, without renal calculi, focal lesion, or hydronephrosis. Bladder is unremarkable. Stomach/Bowel: Stomach is moderately enlarged. No evidence for small bowel obstruction. New left lower quadrant ileostomy. Prior sigmoid colectomy. Small moderate fat containing parastomal hernia. Mild edema in the adjacent subcutaneous fat. Takedown of previously noted left lower quadrant colostomy with reanastomosis at the sigmoid colon. Diffuse inflammatory changes within the upper pelvis, anterior abdomen in left upper and lower quadrants. Wall thickening of the splenic flexure, descending colon and sigmoid colon. Possible small foci of extraluminal gas at the junction of the descending and rectosigmoid colon. Thickened small bowel loops in the left hemiabdomen. Vascular/Lymphatic: Diminutive appearing distal abdominal aorta and iliac vessels. Mild aortic atherosclerosis. No significantly enlarged lymph nodes. Reproductive: Prostate is unremarkable. Other: Negative for free air. Small foci of gas within the midline abdomen terrier abdominal wall in the region of postoperative scarring. There is soft tissue thickening in the region as well is thickening of the underlying rectus. 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 IMPRESSION: 1. Interval takedown of left lower quadrant colostomy with reanastomosis at the rectosigmoid colon  and creation of left lower quadrant ileostomy. Diffuse edema and soft tissue  inflammatory changes within the left upper and lower quadrants with bowel wall thickening involving the splenic flexure, descending colon and sigmoid colon. Pericolonic soft tissue stranding and edema. Not certain if the findings are related to resolving postoperative change versus acute infectious or inflammatory colitis. There may be small extraluminal gas in the left lower quadrant at the junction of the descending and rectosigmoid colon, either representing small foci of residual operative gas versus small contained perforation. Correlate with surgical history. 2. 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 Edema and soft tissue stranding within the subcutaneous fat adjacent to the ileostomy bowel loop, probably due to postoperative change. Adjacent edema and soft tissue thickening of the midline ventral abdominal wall with thickening of the rectus, small foci of soft tissue gas, and edema in the anterior mesentery, presumably due to recent surgery. 3. Thickened small bowel loops in the left hemiabdomen, likely reactive 4. Gallstones Electronically Signed   By: Jasmine PangKim  Fujinaga M.D.   On: 05/15/2017 19:05     CBC Recent Labs  Lab 05/15/17 1618 05/16/17 0034 05/16/17 0422 05/16/17 1521 05/17/17 0558  WBC 12.9* 8.9 7.3 6.3 4.7  HGB 11.3* 10.6* 10.5* 9.2* 9.8*  HCT 34.0* 32.7* 31.8* 28.3* 29.9*  PLT 870* 801* 805* 798* 743*  MCV 84.4 84.7 84.4 85 85.0  MCH 27.9 27.4 27.7 27.7 27.8  MCHC 33.1 32.4 32.9 32.5 32.8  RDW 14.0 13.9 14.0 13.8 14.0  LYMPHSABS 1.1  --   --  0.7  --   MONOABS 0.8  --   --   --   --   EOSABS 0.0  --   --  0.0  --   BASOSABS 0.0  --   --  0.0  --     Chemistries  Recent Labs  Lab 05/15/17 1618 05/16/17 0034 05/16/17 0422 05/17/17 0558  NA 125*  --  130* 136  K 4.9  --  5.1 3.9  CL 91*  --  101 106  CO2 19*  --  18* 23  GLUCOSE 164*  --  256* 179*  BUN 54*  --  48* 24*  CREATININE 4.76* 3.02* 2.32* 0.96  CALCIUM 9.4  --   8.6* 8.7*  AST 20  --   --   --   ALT 18  --   --   --   ALKPHOS 99  --   --   --   BILITOT 0.7  --   --   --    ------------------------------------------------------------------------------------------------------------------ estimated creatinine clearance is 98.2 mL/min (by C-G formula based on SCr of 0.96 mg/dL). ------------------------------------------------------------------------------------------------------------------ No results for input(s): HGBA1C in the last 72 hours. ------------------------------------------------------------------------------------------------------------------ No results for input(s): CHOL, HDL, LDLCALC, TRIG, CHOLHDL, LDLDIRECT in the last 72 hours. ------------------------------------------------------------------------------------------------------------------ No results for input(s): TSH, T4TOTAL, T3FREE, THYROIDAB in the last 72 hours.  Invalid input(s): FREET3 ------------------------------------------------------------------------------------------------------------------ No results for input(s): VITAMINB12, FOLATE, FERRITIN, TIBC, IRON, RETICCTPCT in the last 72 hours.  Coagulation profile No results for input(s): INR, PROTIME in the last 168 hours.  No results for input(s): DDIMER in the last 72 hours.  Cardiac Enzymes Recent Labs  Lab 05/16/17 0034  TROPONINI <0.03   ------------------------------------------------------------------------------------------------------------------ Invalid input(s): POCBNP    Assessment & Plan   1.Acute Renal failure secondary to dehydration improving Renal function improved with iv fluids  2.Abdominal wound from previous surgery Wound vac to continue Daily dressings S/p surgery  f/u On ciprofloxacin abx  3.HIV disease HAART meds  4.Diabetes type II Sliding scale coverage with insulin  5. Hyponatremia resolved  6.Discharge in AM if ancillary services arranged      Code Status Orders   (From admission, onward)        Start     Ordered   05/15/17 2355  Full code  Continuous     05/15/17 2354    Code Status History    Date Active Date Inactive Code Status Order ID Comments User Context   11/25/2016 1327 11/26/2016 1930 Full Code [id number]  Altamese Dilling, MD Inpatient   10/06/2016 1953 10/10/2016 1804 Full Code [id number]  Ramonita Lab, MD Inpatient         Time Spent in minutes   36  Greater than 50% of time spent in care coordination and counseling patient regarding the condition and plan of care.   Ihor Austin M.D on 05/17/2017 at 7:48 PM  Between 7am to 6pm - Pager - [phone]  After 6pm go to [email or web address] - Social research officer, government  [employer]   Office  [phone]

## 2017-05-17 NOTE — Progress Notes (Signed)
Patient will be discharge home in am due to missing part on the home wound vac which will be supply in am as per CW.

## 2017-05-17 NOTE — Care Management (Signed)
Received call from primary nurse that patient has his own wound vac here but he does not have his charger or any canisters. [facility] is active with [facility]. I had Barbara CowerJason with [facility] look at wound vac but he said it was a [employer] vac. TC to [employer]my Rogers with [employer].  Explained situation. He will call back as soon as possible. Patient to discharge home by EMS. Medical necessity form and face sheet completed and placed on chart.

## 2017-05-17 NOTE — Progress Notes (Signed)
CC: ilesu Subjective: Ostomy working, no n/v ,taking clears Objective: Vital signs in last 24 hours: Temp:  [97.8 F (36.6 C)-98.3 F (36.8 C)] 98.3 F (36.8 C) (04/11 1656) Pulse Rate:  [86-110] 110 (04/11 1656) Resp:  [17-19] 19 (04/11 1656) BP: (136-142)/(80-101) 138/96 (04/11 1656) SpO2:  [100 %] 100 % (04/11 1656) Last BM Date: 05/16/17  Intake/Output from previous day: 04/10 0701 - 04/11 0700 In: -  Out: 5200 [Urine:3800; Stool:1400] Intake/Output this shift: No intake/output data recorded.  Physical exam:  NAD, awake and alert AbdL: soft, VAc in place ostomy pink and patent. No peritonitis Ext: no edema and well perfused  Lab Results: CBC  Recent Labs    05/16/17 1521 05/17/17 0558  WBC 6.3 4.7  HGB 9.2* 9.8*  HCT 28.3* 29.9*  PLT 798* 743*   BMET Recent Labs    05/16/17 0422 05/17/17 0558  NA 130* 136  K 5.1 3.9  CL 101 106  CO2 18* 23  GLUCOSE 256* 179*  BUN 48* 24*  CREATININE 2.32* 0.96  CALCIUM 8.6* 8.7*   PT/INR No results for input(s): LABPROT, INR in the last 72 hours. ABG No results for input(s): PHART, HCO3 in the last 72 hours.  Invalid input(s): PCO2, PO2  Studies/Results: No results found.  Anti-infectives: Anti-infectives (From admission, onward)   Start     Dose/Rate Route Frequency Ordered Stop   05/16/17 2200  ciprofloxacin (CIPRO) IVPB 400 mg  Status:  Discontinued     400 mg 200 mL/hr over 60 Minutes Intravenous Every 24 hours 05/15/17 2152 05/16/17 0511   05/16/17 2200  ciprofloxacin (CIPRO) IVPB 400 mg     400 mg 200 mL/hr over 60 Minutes Intravenous Every 12 hours 05/16/17 0511     05/16/17 1000  bictegravir-emtricitabine-tenofovir AF (BIKTARVY) 50-200-25 MG per tablet 1 tablet  Status:  Discontinued     1 tablet Oral Daily 05/15/17 2354 05/16/17 0319   05/16/17 1000  bictegravir-emtricitabine-tenofovir AF (BIKTARVY) 50-200-25 MG per tablet 1 tablet  Status:  Discontinued     1 tablet Oral every 72 hours 05/16/17  0319 05/16/17 0419   05/16/17 1000  bictegravir-emtricitabine-tenofovir AF (BIKTARVY) 50-200-25 MG per tablet 1 tablet     1 tablet Oral Daily 05/16/17 0419     05/16/17 0015  acyclovir (ZOVIRAX) 200 MG capsule 400 mg     400 mg Oral Every 12 hours 05/16/17 0007     05/16/17 0000  acyclovir (ZOVIRAX) tablet 400 mg  Status:  Discontinued     400 mg Oral 2 times daily 05/15/17 2354 05/16/17 0007   05/15/17 2145  metroNIDAZOLE (FLAGYL) IVPB 500 mg     500 mg 100 mL/hr over 60 Minutes Intravenous Every 8 hours 05/15/17 2133     05/15/17 1930  levofloxacin (LEVAQUIN) IVPB 750 mg     750 mg 100 mL/hr over 90 Minutes Intravenous  Once 05/15/17 1916 05/15/17 2140   05/15/17 1930  metroNIDAZOLE (FLAGYL) IVPB 500 mg     500 mg 100 mL/hr over 60 Minutes Intravenous  Once 05/15/17 1916 05/16/17 0009      Assessment/Plan:  ileus improving. adv acne diet No surgical intervention We will be available  Cory Bigiego Magnus Crescenzo, MD, [facility]  05/17/2017

## 2017-05-17 NOTE — Progress Notes (Signed)
MD on call was made aware of patient not going home ,

## 2017-05-17 NOTE — Consult Note (Signed)
WOC Nurse ostomy follow up Stoma type/location: LLQ ileostomy (loop) Stomal assessment/size: 1 and 3/8 x 1 and 3/4 inches. Peristomal assessment: improved since yesterday Treatment options for stomal/peristomal skin: Skin barrier ring Output:thin green/brown stool Ostomy pouching: 1pc.convex pouch with skin barrier ring.  Education provided: Reminded to empty pouch when 1/3 to 1/2 full of stool. Patient was sleeping soundly this morning and allowed pouch to overfill. Enrolled patient in [facility] [employer] Discharge program: No.  WOC nursing team will see tomorrow for NPWT change. We will remain available to this patient, the nursing, surgical and medical teams. Thanks, Ladona MowLaurie Dearia Wilmouth, MSN, RN, GNP, Hans EdenCWOCN, CWON-AP, FAAN  Pager# [phone]

## 2017-05-18 LAB — GLUCOSE, CAPILLARY
GLUCOSE-CAPILLARY: 214 mg/dL — AB (ref 65–99)
GLUCOSE-CAPILLARY: 216 mg/dL — AB (ref 65–99)
Glucose-Capillary: 252 mg/dL — ABNORMAL HIGH (ref 65–99)

## 2017-05-18 NOTE — Plan of Care (Signed)

## 2017-05-18 NOTE — Progress Notes (Signed)
Advanced care plan.  Purpose of the Encounter: CODE STATUS  Parties in Attendance: Patient  Patient's Decision Capacity: Good  Subjective/Patient's story: Presented with dehydration, renal failure  Objective/Medical story Was dry and dehydrated Hydration with IV fluids  Goals of care determination:  Advance directives discussed Patient wants everything done, cardiac resuscitation, intubation, ventilator if need arises  CODE STATUS: Full code  Time spent discussing advanced care planning: 16 minutes

## 2017-05-18 NOTE — Plan of Care (Signed)

## 2017-05-18 NOTE — Care Management (Signed)
Telephone call to Glenna Fellowsommy Rogers, [employer] representative, [phone]). States he will have a canister and charger delivered to Mr. Santillanes's room this morning. Transportation will be arranged per [facility]. Gwenette GreetBrenda S Natarsha Hurwitz RN MSN CCM Care Management [phone]

## 2017-05-18 NOTE — Progress Notes (Signed)
Patient is dischargwe home in a stable condition, summary and f/u care given verbalized understanding , left by EMS

## 2017-05-18 NOTE — Consult Note (Signed)
WOC Nurse wound follow up note Reason for Consult:Surgical wound healing by secondary intention Wound type:Surgical Pressure Injury POA: NA Measurement:two wounds, supraumbilical and inferior to umbilicus Combined, 11cm x 2.8cm x 0.8cm with 100% red, moist and scant serous, scant serosanguinous exudate. Wound bed:See above Drainage (amount, consistency, odor) See above Periwound:Intact Dressing procedure/placement/frequency: NPWT dressing removed and wound cleansed with NS and gently patted dry.  Proximal periwound protected with strips of drape, distal periwound protected with skin barrier ring to enhance seal.  One-quarter skin barrier ring placed into umbilicus. Black granufoam placed in wound bed, covered with drape.  Attached to negative pressure, 125mmHg continuous, and an immediate seal is achieved. NOTE:  Patient [place] and Tuesday of next week.  HHRN will need to visit on Sunday to change NPWT and ostomy pouching system as it will not hold until next Wednesday.  WOC Nurse ostomy follow up Stoma type/location: LLQ ileostomy (loop).  [facility] Stomal assessment/size: oval, 1 and 3/8 inches x 1 and 5/8 inches.  Slightly budded. Peristomal assessment: Intact today (Improved since earlier in the week) Treatment options for stomal/peristomal skin: skin barrier ring and convex pouch Output: food particles and brown effluent Ostomy pouching: 1pc.convex ostomy pouch and skin barrier ring.  I of each sent home with patient. Education provided: It was noted that larger food particles are coming from ileostomy this morning. Patient instructed to chew food thoroughly and to drink plenty of fluids to avoid blockage.  Taught that diameter of small bowel from which ileostomy is made is less than diameter of colon and that consequently food must be more thoroughly chewed. He expressed appreciation for instruction.  He is also  reminded to empty pouch when 1/3 to 1/2 full.  He again states that he did this yesterday, but that it got too full this morning and is "coming off"/pulling away from his body.  An ostomy belt is ordered today to support the pouching system bedside RN to apply prior to discharge. [place]rolled patient in [facility] [employer] Discharge program: No  WOC nursing team will not follow, but will remain available to this patient, the nursing and medical teams.  Please re-consult if needed. Thanks, Ladona MowLaurie Jenay Morici, MSN, RN, GNP, Hans EdenCWOCN, CWON-AP, FAAN  Pager# [phone]

## 2017-05-18 NOTE — Discharge Summary (Signed)
Cory Campbell at [facility], [place] y.o., DOB 29-May-1980, MRN [id number]. Admission date: 05/15/2017 Discharge Date 05/18/2017 Primary MD Donnie Coffin, MD Admitting Physician Vaughan Basta, MD  Admission Diagnosis   1.  Acute renal failure 2.  Dehydration 3.  HIV disease 4.  Diabetes mellitus type 2 5.  Tobacco abuse  Discharge Diagnosis       1 acute renal failure resolved. 2.  Dehydration resolved 3.HIV disease 4.  Type 2 diabetes mellitus  Hospital Course   37 year old male patient with history of HIV disease, tobacco abuse, diabetes mellitus type 2 had abdominal surgery at [facility] and currently gets dressing changes for abdominal wound by home health at home he was admitted on.  05/15/2017 for renal failure and dehydration and questionable sepsis.  CT of the abdomen showed some edema of the intestinal walls and some stranding around.  Patient was admitted to medical floor.  He had dressing changes to the abdominal wound and had a wound VAC.  Surgical consultant Dr. Adora Fridge saw the patient and recommended no acute intervention.  Patient was hydrated with IV fluids his renal failure resolved dehydration resolved.  Tobacco cessation counseled to the patient for 6 minutes.  Nicotine patch offered.  Harmful effects of smoking explained to the patient.  Stool for C. difficile toxin is negative.  Gastrointestinal panel by PCR stool no organisms detected.  Blood cultures revealed no growth.  MRSA by PCR is negative.  Patient hemodynamically stable will be discharged home and home health services to be resumed.  Follow-up with surgical services for his wound care at [facility] and wound care center at [place]   Surgery consult Dr Dahlia Byes  Significant Tests:  See full reports for all details    Ct Abdomen Pelvis Wo Contrast  Result Date: 05/15/2017 CLINICAL DATA:  Abdominal pain history of ileostomy EXAM: CT ABDOMEN AND PELVIS WITHOUT  CONTRAST TECHNIQUE: Multidetector CT imaging of the abdomen and pelvis was performed following the standard protocol without IV contrast. COMPARISON:  CT 12/05/2016, 11/25/2016, 02/26/2016, 12/27/2010 FINDINGS: Lower chest: Lung bases demonstrate no acute consolidation or effusion. Mild dependent atelectasis. Small pericardial effusion. Hepatobiliary: Calcified gallstones. No focal hepatic abnormality or biliary dilatation Pancreas: Unremarkable. No pancreatic ductal dilatation or surrounding inflammatory changes. Spleen: Normal in size without focal abnormality. Adrenals/Urinary Tract: Adrenal glands are unremarkable. Kidneys are normal, without renal calculi, focal lesion, or hydronephrosis. Bladder is unremarkable. Stomach/Bowel: Stomach is moderately enlarged. No evidence for small bowel obstruction. New left lower quadrant ileostomy. Prior sigmoid colectomy. Small moderate fat containing parastomal hernia. Mild edema in the adjacent subcutaneous fat. Takedown of previously noted left lower quadrant colostomy with reanastomosis at the sigmoid colon. Diffuse inflammatory changes within the upper pelvis, anterior abdomen in left upper and lower quadrants. Wall thickening of the splenic flexure, descending colon and sigmoid colon. Possible small foci of extraluminal gas at the junction of the descending and rectosigmoid colon. Thickened small bowel loops in the left hemiabdomen. Vascular/Lymphatic: Diminutive appearing distal abdominal aorta and iliac vessels. Mild aortic atherosclerosis. No significantly enlarged lymph nodes. Reproductive: Prostate is unremarkable. Other: Negative for free air. Small foci of gas within the midline abdomen terrier abdominal wall in the region of postoperative scarring. There is soft tissue thickening in the region as well is thickening of the underlying rectus. 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 IMPRESSION: 1. Interval takedown of left lower  quadrant colostomy with reanastomosis at the rectosigmoid colon and creation of left lower quadrant ileostomy. Diffuse edema and soft tissue inflammatory changes within the left upper and lower quadrants with bowel wall thickening involving the splenic flexure, descending colon and sigmoid colon. Pericolonic soft tissue stranding and edema. Not certain if the findings are related to resolving postoperative change versus acute infectious or inflammatory colitis. There may be small extraluminal gas in the left lower quadrant at the junction of the descending and rectosigmoid colon, either representing small foci of residual operative gas versus small contained perforation. Correlate with surgical history. 2. 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 Edema and soft tissue stranding within the subcutaneous fat adjacent to the ileostomy bowel loop, probably due to postoperative change. Adjacent edema and soft tissue thickening of the midline ventral abdominal wall with thickening of the rectus, small foci of soft tissue gas, and edema in the anterior mesentery, presumably due to recent surgery. 3. Thickened small bowel loops in the left hemiabdomen, likely reactive 4. Gallstones Electronically Signed   By: Donavan Foil M.D.   On: 05/15/2017 19:05       Today   Subjective:   Cory Campbell is a 37 year old male patient in no acute distress No chest pain No shortness of breath No fever and chills No cough Tolerated diet well  Objective:   Blood pressure 136/89, pulse (!) 102, temperature 98 F (36.7 C), temperature source Oral, resp. rate 18, height _0  (1.6 m), weight 79.4 kg (175 lb), SpO2 100 %.  .  Intake/Output Summary (Last 24 hours) at 05/18/2017 1312 Last data filed at 05/18/2017 1100 Gross per 24 hour  Intake 100 ml  Output 4925 ml  Net -4825 ml    Exam VITAL SIGNS: Blood pressure  136/89, pulse (!) 102, temperature 98 F (36.7 C), temperature source Oral, resp. rate 18, height _1  (1.6 m), weight 79.4 kg (175 lb), SpO2 100 %.  GENERAL:  37 y.o.-year-old patient lying in the bed with no acute distress.  EYES: Pupils equal, round, reactive to light and accommodation. No scleral icterus. Extraocular muscles intact.  HEENT: Head atraumatic, normocephalic. Oropharynx and nasopharynx clear.  NECK:  Supple, no jugular venous distention. No thyroid enlargement, no tenderness.  LUNGS: Normal breath sounds bilaterally, no wheezing, rales,rhonchi or crepitation. No use of accessory muscles of respiration.  CARDIOVASCULAR: S1, S2 normal. No murmurs, rubs, or gallops.  ABDOMEN: Soft, nontender, nondistended. Bowel sounds present. No organomegaly or mass.  Has abdominal bandage connected to wound VAC EXTREMITIES: No pedal edema, cyanosis, or clubbing.  NEUROLOGIC: Cranial nerves II through XII are intact. Muscle strength 5/5 in all extremities. Sensation intact. Gait not checked.  PSYCHIATRIC: The patient is alert and oriented x 3.  SKIN: No obvious rash, lesion, or ulcer.   Data Review     CBC w Diff:  Lab Results  Component Value Date   WBC 4.7 05/17/2017   HGB 9.8 (L) 05/17/2017   HGB 9.2 (L) 05/16/2017   HCT 29.9 (L) 05/17/2017   HCT 28.3 (L) 05/16/2017   PLT 743 (H) 05/17/2017   PLT 798 (H) 05/16/2017   LYMPHOPCT 9 05/15/2017   BANDSPCT 0 10/06/2016   MONOPCT 6 05/15/2017   EOSPCT 0 05/15/2017   BASOPCT 0 05/15/2017   CMP:  Lab Results  Component Value Date   NA 136 05/17/2017   NA 134 (L) 08/28/2013   K 3.9 05/17/2017   K 2.9 (L) 08/28/2013   CL  106 05/17/2017   CL 96 (L) 08/28/2013   CO2 23 05/17/2017   CO2 28 08/28/2013   BUN 24 (H) 05/17/2017   BUN 8 08/28/2013   CREATININE 0.96 05/17/2017   CREATININE 0.70 08/28/2013   PROT 9.0 (H) 05/15/2017   PROT 7.7 02/09/2012   ALBUMIN 3.9 05/15/2017   ALBUMIN 3.9 02/09/2012   BILITOT 0.7 05/15/2017    BILITOT 0.6 02/09/2012   ALKPHOS 99 05/15/2017   ALKPHOS 78 02/09/2012   AST 20 05/15/2017   AST 38 (H) 02/09/2012   ALT 18 05/15/2017   ALT 38 02/09/2012  .  Micro Results Recent Results (from the past 240 hour(s))  Gastrointestinal Panel by PCR , Stool     Status: None   Collection Time: 05/15/17 10:16 PM  Result Value Ref Range Status   Campylobacter species NOT DETECTED NOT DETECTED Final   Plesimonas shigelloides NOT DETECTED NOT DETECTED Final   Salmonella species NOT DETECTED NOT DETECTED Final   Yersinia enterocolitica NOT DETECTED NOT DETECTED Final   Vibrio species NOT DETECTED NOT DETECTED Final   Vibrio cholerae NOT DETECTED NOT DETECTED Final   Enteroaggregative E coli (EAEC) NOT DETECTED NOT DETECTED Final   Enteropathogenic E coli (EPEC) NOT DETECTED NOT DETECTED Final   Enterotoxigenic E coli (ETEC) NOT DETECTED NOT DETECTED Final   Shiga like toxin producing E coli (STEC) NOT DETECTED NOT DETECTED Final   Shigella/Enteroinvasive E coli (EIEC) NOT DETECTED NOT DETECTED Final   Cryptosporidium NOT DETECTED NOT DETECTED Final   Cyclospora cayetanensis NOT DETECTED NOT DETECTED Final   Entamoeba histolytica NOT DETECTED NOT DETECTED Final   Giardia lamblia NOT DETECTED NOT DETECTED Final   Adenovirus F40/41 NOT DETECTED NOT DETECTED Final   Astrovirus NOT DETECTED NOT DETECTED Final   Norovirus GI/GII NOT DETECTED NOT DETECTED Final   Rotavirus A NOT DETECTED NOT DETECTED Final   Sapovirus (I, II, IV, and V) NOT DETECTED NOT DETECTED Final    Comment: Performed at [facility], [address] [place], [place] [address]  C difficile quick scan w PCR reflex     Status: None   Collection Time: 05/15/17 10:16 PM  Result Value Ref Range Status   C Diff antigen NEGATIVE NEGATIVE Final   C Diff toxin NEGATIVE NEGATIVE Final   C Diff interpretation No C. difficile detected.  Final    Comment: Performed at [facility], [address]  [place], [place] [address]        Code Status Orders  (From admission, onward)        Start     Ordered   05/15/17 2355  Full code  Continuous     05/15/17 2354    Code Status History    Date Active Date Inactive Code Status Order ID Comments User Context   11/25/2016 1327 11/26/2016 1930 Full Code [id number]  Vaughan Basta, MD Inpatient   10/06/2016 1953 10/10/2016 1804 Full Code [id number]  Nicholes Mango, MD Inpatient          Follow-up Information    Donnie Coffin, MD. Go on 05/19/2017.   Specialty:  Family Medicine Why:  _0 :40PM Contact information: [address] [place] [address] [phone]           Discharge Medications   Allergies as of 05/18/2017      Reactions   Penicillins Hives, Itching, Rash   Has patient had a PCN reaction causing immediate rash, facial/tongue/throat swelling, SOB or lightheadedness with  hypotension: Yes Has patient had a PCN reaction causing severe rash involving mucus membranes or skin necrosis: No Has patient had a PCN reaction that required hospitalization No Has patient had a PCN reaction occurring within the last 10 years: No If all of the above answers are "NO", then may proceed with Cephalosporin use.   Erythromycin Base Itching, Rash      Medication List    TAKE these medications   acyclovir 400 MG tablet Commonly known as:  ZOVIRAX Take 400 mg by mouth 2 (two) times daily.   albuterol (2.5 MG/3ML) 0.083% nebulizer solution Commonly known as:  PROVENTIL Take 2.5 mg by nebulization every 4 (four) hours as needed for wheezing or shortness of breath.   albuterol 108 (90 Base) MCG/ACT inhaler Commonly known as:  PROVENTIL HFA;VENTOLIN HFA Inhale 2 puffs into the lungs every 4 (four) hours as needed for wheezing or shortness of breath.   alfuzosin 10 MG 24 hr tablet Commonly known as:  UROXATRAL Take 10 mg by mouth daily.   aspirin 81 MG tablet Take 81 mg by mouth daily.   BIKTARVY 50-200-25 MG  Tabs tablet Generic drug:  bictegravir-emtricitabine-tenofovir AF Take 1 tablet by mouth daily.   buPROPion 150 MG 24 hr tablet Commonly known as:  WELLBUTRIN XL Take 1 tablet by mouth daily.   BUTRANS 15 MCG/HR Ptwk Generic drug:  Buprenorphine Place 15 mg onto the skin once a week. Saturday   citalopram 20 MG tablet Commonly known as:  CELEXA Take 20 mg by mouth daily.   diclofenac sodium 1 % Gel Commonly known as:  VOLTAREN Apply 2-4 g topically 4 (four) times daily as needed. For pain.   dicyclomine 20 MG tablet Commonly known as:  BENTYL Take 20 mg by mouth every 6 (six) hours.   famotidine 20 MG tablet Commonly known as:  PEPCID Take 20 mg by mouth daily.   feeding supplement (GLUCERNA SHAKE) Liqd Take 237 mLs by mouth 4 (four) times daily.   finasteride 5 MG tablet Commonly known as:  PROSCAR Take 5 mg by mouth daily.   fluticasone 110 MCG/ACT inhaler Commonly known as:  FLOVENT HFA Inhale 2 puffs into the lungs 2 (two) times daily.   gabapentin 300 MG capsule Commonly known as:  NEURONTIN Take 300 mg by mouth at bedtime.   hydrocortisone 2.5 % cream Apply 1 application topically 2 (two) times daily as needed for itching.   ketoconazole 2 % shampoo Commonly known as:  NIZORAL Apply 1 application topically 2 (two) times a week. tues and thursday   loratadine 10 MG tablet Commonly known as:  CLARITIN Take 10 mg by mouth daily.   Melatonin 5 MG Tabs Take 10 mg by mouth at bedtime as needed (sleep).   metFORMIN 750 MG 24 hr tablet Commonly known as:  GLUCOPHAGE-XR Take 750 mg by mouth 2 (two) times daily.   metoprolol tartrate 100 MG tablet Commonly known as:  LOPRESSOR Take 150 mg by mouth 2 (two) times daily.   miconazole 2 % cream Commonly known as:  MICOTIN Apply 1 application topically 2 (two) times daily.   mirtazapine 45 MG tablet Commonly known as:  REMERON Take 45 mg by mouth at bedtime.   multivitamin tablet Take 1 tablet by mouth  daily.   naloxone 4 MG/0.1ML Liqd nasal spray kit Commonly known as:  NARCAN One spray in either nostril once for known/suspected opioid overdose. May repeat every 2-3 minutes in alternating nostril til EMS arrives  nicotine 14 mg/24hr patch Commonly known as:  NICODERM CQ - dosed in mg/24 hours Place 14 mg onto the skin daily.   nicotine polacrilex 4 MG lozenge Commonly known as:  COMMIT Take 4 mg by mouth every 2 (two) hours as needed for smoking cessation.   nitroGLYCERIN 0.4 MG SL tablet Commonly known as:  NITROSTAT DISSOLVE 1 TABLET UNDER THE TONGUE EVERY 5 MINUTES AS NEEDED FOR CHEST PAIN. CALL 911 IF CHEST IS NOT RESOLVED WITH 3 TABLETS   nortriptyline 25 MG capsule Commonly known as:  PAMELOR Take 50 mg by mouth at bedtime.   NOVOLOG FLEXPEN 100 UNIT/ML FlexPen Generic drug:  insulin aspart Inject 12 Units into the skin 3 (three) times daily with meals. With every meal and every snack   nystatin-triamcinolone cream Commonly known as:  MYCOLOG II Apply 1 application topically 2 (two) times daily.   omega-3 acid ethyl esters 1 g capsule Commonly known as:  LOVAZA Take 1 g by mouth daily.   omeprazole 20 MG capsule Commonly known as:  PRILOSEC Take 20 mg by mouth daily.   ondansetron 4 MG disintegrating tablet Commonly known as:  ZOFRAN-ODT Take 4 mg by mouth every 8 (eight) hours as needed for nausea or vomiting.   Oxycodone HCl 10 MG Tabs Take 10-15 mg by mouth every 4 (four) hours as needed (pain).   potassium chloride SA 20 MEQ tablet Commonly known as:  K-DUR,KLOR-CON take 1 tablet by mouth once daily if needed What changed:  See the new instructions.   prednisoLONE acetate 1 % ophthalmic suspension Commonly known as:  PRED FORTE Place 1 drop into the right eye daily.   solifenacin 10 MG tablet Commonly known as:  VESICARE Take 10 mg by mouth daily.   tiZANidine 4 MG capsule Commonly known as:  ZANAFLEX Take 4 mg by mouth 3 (three) times daily  as needed for muscle spasms.   TRESIBA FLEXTOUCH 100 UNIT/ML Sopn FlexTouch Pen Generic drug:  insulin degludec Inject 80 Units into the skin at bedtime.          Total Time in preparing paper work, data evaluation and todays exam - 35 minutes  Saundra Shelling M.D on 05/18/2017 at 1:12 PM [place]  [phone]

## 2017-05-19 ENCOUNTER — Other Ambulatory Visit: Payer: Self-pay

## 2017-05-19 ENCOUNTER — Encounter: Payer: Self-pay | Admitting: *Deleted

## 2017-05-19 ENCOUNTER — Emergency Department: Payer: Medicaid Other

## 2017-05-19 ENCOUNTER — Inpatient Hospital Stay
Admission: EM | Admit: 2017-05-19 | Discharge: 2017-05-22 | DRG: 392 | Disposition: A | Discharge status: Home Health | Payer: Medicaid Other | Attending: Family Medicine | Admitting: Family Medicine

## 2017-05-19 DIAGNOSIS — E785 Hyperlipidemia, unspecified: Secondary | ICD-10-CM | POA: Diagnosis present

## 2017-05-19 DIAGNOSIS — G894 Chronic pain syndrome: Secondary | ICD-10-CM | POA: Diagnosis present

## 2017-05-19 DIAGNOSIS — Z880 Allergy status to penicillin: Secondary | ICD-10-CM

## 2017-05-19 DIAGNOSIS — Z6831 Body mass index (BMI) 31.0-31.9, adult: Secondary | ICD-10-CM

## 2017-05-19 DIAGNOSIS — Z21 Asymptomatic human immunodeficiency virus [HIV] infection status: Secondary | ICD-10-CM | POA: Diagnosis present

## 2017-05-19 DIAGNOSIS — Z7951 Long term (current) use of inhaled steroids: Secondary | ICD-10-CM

## 2017-05-19 DIAGNOSIS — G4733 Obstructive sleep apnea (adult) (pediatric): Secondary | ICD-10-CM | POA: Diagnosis present

## 2017-05-19 DIAGNOSIS — E872 Acidosis: Secondary | ICD-10-CM | POA: Diagnosis present

## 2017-05-19 DIAGNOSIS — E876 Hypokalemia: Secondary | ICD-10-CM | POA: Diagnosis present

## 2017-05-19 DIAGNOSIS — Z8673 Personal history of transient ischemic attack (TIA), and cerebral infarction without residual deficits: Secondary | ICD-10-CM

## 2017-05-19 DIAGNOSIS — M1990 Unspecified osteoarthritis, unspecified site: Secondary | ICD-10-CM | POA: Diagnosis present

## 2017-05-19 DIAGNOSIS — K529 Noninfective gastroenteritis and colitis, unspecified: Secondary | ICD-10-CM | POA: Diagnosis present

## 2017-05-19 DIAGNOSIS — E669 Obesity, unspecified: Secondary | ICD-10-CM | POA: Diagnosis present

## 2017-05-19 DIAGNOSIS — N400 Enlarged prostate without lower urinary tract symptoms: Secondary | ICD-10-CM | POA: Diagnosis present

## 2017-05-19 DIAGNOSIS — Z794 Long term (current) use of insulin: Secondary | ICD-10-CM

## 2017-05-19 DIAGNOSIS — I10 Essential (primary) hypertension: Secondary | ICD-10-CM | POA: Diagnosis present

## 2017-05-19 DIAGNOSIS — K5732 Diverticulitis of large intestine without perforation or abscess without bleeding: Secondary | ICD-10-CM | POA: Diagnosis present

## 2017-05-19 DIAGNOSIS — E119 Type 2 diabetes mellitus without complications: Secondary | ICD-10-CM | POA: Diagnosis present

## 2017-05-19 DIAGNOSIS — Z947 Corneal transplant status: Secondary | ICD-10-CM

## 2017-05-19 DIAGNOSIS — F329 Major depressive disorder, single episode, unspecified: Secondary | ICD-10-CM | POA: Diagnosis present

## 2017-05-19 DIAGNOSIS — L409 Psoriasis, unspecified: Secondary | ICD-10-CM | POA: Diagnosis present

## 2017-05-19 DIAGNOSIS — F17200 Nicotine dependence, unspecified, uncomplicated: Secondary | ICD-10-CM | POA: Diagnosis present

## 2017-05-19 DIAGNOSIS — R197 Diarrhea, unspecified: Secondary | ICD-10-CM

## 2017-05-19 DIAGNOSIS — G40909 Epilepsy, unspecified, not intractable, without status epilepticus: Secondary | ICD-10-CM | POA: Diagnosis present

## 2017-05-19 DIAGNOSIS — F419 Anxiety disorder, unspecified: Secondary | ICD-10-CM | POA: Diagnosis present

## 2017-05-19 DIAGNOSIS — R109 Unspecified abdominal pain: Secondary | ICD-10-CM

## 2017-05-19 DIAGNOSIS — T8189XA Other complications of procedures, not elsewhere classified, initial encounter: Secondary | ICD-10-CM

## 2017-05-19 DIAGNOSIS — Z932 Ileostomy status: Secondary | ICD-10-CM

## 2017-05-19 DIAGNOSIS — Z881 Allergy status to other antibiotic agents status: Secondary | ICD-10-CM

## 2017-05-19 DIAGNOSIS — K219 Gastro-esophageal reflux disease without esophagitis: Secondary | ICD-10-CM | POA: Diagnosis present

## 2017-05-19 DIAGNOSIS — J45909 Unspecified asthma, uncomplicated: Secondary | ICD-10-CM | POA: Diagnosis present

## 2017-05-19 LAB — COMPREHENSIVE METABOLIC PANEL
ALT: 22 U/L (ref 17–63)
AST: 31 U/L (ref 15–41)
Albumin: 3.7 g/dL (ref 3.5–5.0)
Alkaline Phosphatase: 71 U/L (ref 38–126)
Anion gap: 10 (ref 5–15)
BUN: 15 mg/dL (ref 6–20)
CHLORIDE: 99 mmol/L — AB (ref 101–111)
CO2: 24 mmol/L (ref 22–32)
CREATININE: 0.73 mg/dL (ref 0.61–1.24)
Calcium: 9.3 mg/dL (ref 8.9–10.3)
Glucose, Bld: 190 mg/dL — ABNORMAL HIGH (ref 65–99)
POTASSIUM: 3.1 mmol/L — AB (ref 3.5–5.1)
Sodium: 133 mmol/L — ABNORMAL LOW (ref 135–145)
Total Bilirubin: 0.2 mg/dL — ABNORMAL LOW (ref 0.3–1.2)
Total Protein: 7.6 g/dL (ref 6.5–8.1)

## 2017-05-19 LAB — GASTROINTESTINAL PANEL BY PCR, STOOL (REPLACES STOOL CULTURE)
Adenovirus F40/41: NOT DETECTED
Astrovirus: NOT DETECTED
CAMPYLOBACTER SPECIES: NOT DETECTED
CYCLOSPORA CAYETANENSIS: NOT DETECTED
Cryptosporidium: NOT DETECTED
ENTAMOEBA HISTOLYTICA: NOT DETECTED
ENTEROAGGREGATIVE E COLI (EAEC): NOT DETECTED
ENTEROPATHOGENIC E COLI (EPEC): NOT DETECTED
Enterotoxigenic E coli (ETEC): NOT DETECTED
Giardia lamblia: NOT DETECTED
Norovirus GI/GII: NOT DETECTED
PLESIMONAS SHIGELLOIDES: NOT DETECTED
Rotavirus A: NOT DETECTED
SHIGA LIKE TOXIN PRODUCING E COLI (STEC): NOT DETECTED
SHIGELLA/ENTEROINVASIVE E COLI (EIEC): NOT DETECTED
Salmonella species: NOT DETECTED
Sapovirus (I, II, IV, and V): NOT DETECTED
VIBRIO CHOLERAE: NOT DETECTED
Vibrio species: NOT DETECTED
Yersinia enterocolitica: NOT DETECTED

## 2017-05-19 LAB — CBC
HCT: 33 % — ABNORMAL LOW (ref 40.0–52.0)
Hemoglobin: 11.1 g/dL — ABNORMAL LOW (ref 13.0–18.0)
MCH: 28.1 pg (ref 26.0–34.0)
MCHC: 33.5 g/dL (ref 32.0–36.0)
MCV: 83.9 fL (ref 80.0–100.0)
PLATELETS: 742 10*3/uL — AB (ref 150–440)
RBC: 3.93 MIL/uL — AB (ref 4.40–5.90)
RDW: 14.1 % (ref 11.5–14.5)
WBC: 9.5 10*3/uL (ref 3.8–10.6)

## 2017-05-19 LAB — LIPASE, BLOOD: LIPASE: 59 U/L — AB (ref 11–51)

## 2017-05-19 LAB — NOROVIRUS GROUP 1 & 2 BY PCR, STOOL
NOROVIRUS 1 BY PCR: NEGATIVE
NOROVIRUS 2 BY PCR: NEGATIVE

## 2017-05-19 LAB — C DIFFICILE QUICK SCREEN W PCR REFLEX
C DIFFICILE (CDIFF) TOXIN: NEGATIVE
C Diff antigen: NEGATIVE
C Diff interpretation: NOT DETECTED

## 2017-05-19 MED ORDER — IOPAMIDOL (ISOVUE-300) INJECTION 61%: 100.0000 mL | Freq: Once | INTRAVENOUS | Status: DC | PRN

## 2017-05-19 MED ORDER — HYDROMORPHONE HCL 1 MG/ML IJ SOLN
0.5000 mg | Freq: Once | INTRAMUSCULAR | Status: AC
  Administered 2017-05-19: 0.5 mg via INTRAVENOUS

## 2017-05-19 MED ORDER — IOHEXOL 300 MG/ML  SOLN
100.0000 mL | Freq: Once | INTRAMUSCULAR | Status: AC | PRN
  Administered 2017-05-19: 100 mL via INTRAVENOUS

## 2017-05-19 MED ORDER — DIPHENHYDRAMINE HCL 50 MG/ML IJ SOLN
25.0000 mg | Freq: Once | INTRAMUSCULAR | Status: AC
Start: 2017-05-19 — End: 2017-05-19
  Administered 2017-05-19: 25 mg via INTRAVENOUS

## 2017-05-19 MED ORDER — ONDANSETRON HCL 4 MG/2ML IJ SOLN
4.0000 mg | Freq: Once | INTRAMUSCULAR | Status: AC
  Administered 2017-05-19: 4 mg via INTRAVENOUS

## 2017-05-19 NOTE — ED Notes (Signed)
Pt started reporting generalized body itching. No SOB or airway compromise noted. No rash or swelling. MD aware.

## 2017-05-19 NOTE — ED Triage Notes (Signed)
Pt to ED from home reporting returning diarrhea since discharge drom ED yesterday. PT reports haivn gbeen given medication that helped but was not given a prescription and diarrhea has returned. Pt had a colostomy reversal 3/27 and continues to have a wound vac attached to incision site. No complications reported with post op. No fevers and no signs of infection around abd. Diarrhea noted in the wound vac, ostomy vac sealed over ostomy and ostomy Tegaderm is covered in stool.   Lower abd pain verbalized, no NV or tenderness upon palpation.

## 2017-05-19 NOTE — ED Provider Notes (Signed)
[facility] Emergency Department Provider Note   ____________________________________________   First MD Initiated Contact with Patient 05/19/17 2047     (approximate)  I have reviewed the triage vital signs and the nursing notes.   HISTORY  Chief Complaint Diarrhea    HPI Cory Campbell is a 37 y.o. male patient reports he had recently been discharged from hospital he had had diarrhea at that time was given some outer medication in the hospital that made the diarrhea better but he did not get a prescription for now the diarrhea is back again. He complains of multiple episodes of diarrhea per day greater than 10 very runny stool from his colostomy. He was previously hospitalized with dehydration. He reports a little bit of left-sided abdominal pain now his colostomy dates from this year as I understand it for diverticulitis.he was supposed to have had a takedown of the colostomy but this apparently wasn't fully completed as I understand.   Past Medical History:  Diagnosis Date  . Abrasion or friction burn of foot and toe(s), without mention of infection   . Acid reflux   . Adopted   . Allergic rhinitis, cause unspecified   . Anxiety   . Arthritis   . Arthrogryposis   . Asthma   . Bladder wall thickening   . BPH (benign prostatic hyperplasia)   . Chronic pain syndrome   . Depressive disorder, not elsewhere classified   . Diverticulitis of colon (without mention of hemorrhage)(562.11)   . ED (erectile dysfunction)   . Herpes genitalis   . History of echocardiogram    a. 10/2016: EF 50-55%, normal wall motion  . History of stress test    a. 06/2011: significant GI uptake artifact, no evidence of ischemia, EF 56%  . HIV infection (HCC)   . HTN (hypertension)   . Hyperlipidemia   . Hypogonadism in male   . Myalgia and myositis, unspecified   . OAB (overactive bladder)   . Obesity   . Other psoriasis   . Premature baby    born premature  .  Seizure disorder (HCC)   . Sleep apnea   . 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 or other cause, without mention of thyrotoxic crisis or storm    hyperthyroidism  . TIA (transient ischemic attack)   . Type II or unspecified type diabetes mellitus with neurological manifestations, not stated as uncontrolled(250.60)     Patient Active Problem List   Diagnosis Date Noted  . Colitis   . Acute renal failure (HCC) 05/15/2017  . Diarrhea 05/15/2017  . Sepsis (HCC) 05/15/2017  . Abdominal pain 11/25/2016  . Hypotension 10/08/2016  . Unstable angina (HCC) 10/06/2016  . Shortness of breath 06/23/2015  . Obesity 05/18/2014  . Frequent falls 08/12/2013  . Pain of left clavicle 08/12/2013  . Tachycardia 01/21/2013  . Obstructive sleep apnea on CPAP 06/07/2012  . Chest pain 06/20/2011  . Uncontrolled hypertension 06/20/2011  . Diabetes mellitus (HCC) 06/20/2011    Past Surgical History:  Procedure Laterality Date  . APPENDECTOMY    . COLOSTOMY    . CORNEAL TRANSPLANT    . feet surgery     both  . HAND SURGERY     left and right  . leg surgery     left and right  . ORTHOPEDIC SURGERY     hands, feet, knees, legs  . tubes in ears     both    Prior to Admission medications   Medication  Sig Start Date End Date Taking? Authorizing Provider  acyclovir (ZOVIRAX) 400 MG tablet Take 400 mg by mouth 2 (two) times daily.    [provider]  albuterol (PROVENTIL HFA;VENTOLIN HFA) 108 (90 BASE) MCG/ACT inhaler Inhale 2 puffs into the lungs every 4 (four) hours as needed for wheezing or shortness of breath.     [provider]  albuterol (PROVENTIL) (2.5 MG/3ML) 0.083% nebulizer solution Take 2.5 mg by nebulization every 4 (four) hours as needed for wheezing or shortness of breath.    [provider]  alfuzosin (UROXATRAL) 10 MG 24 hr tablet Take 10 mg by mouth daily.    [provider]  aspirin 81 MG tablet Take 81 mg by mouth daily.    [provider]  BIKTARVY 50-200-25 MG TABS tablet Take 1 tablet by mouth daily. 11/21/16   [provider]  Buprenorphine (BUTRANS) 15 MCG/HR PTWK Place 15 mg onto the skin once a week. Saturday    [provider]  buPROPion (WELLBUTRIN XL) 150 MG 24 hr tablet Take 1 tablet by mouth daily. 11/16/16   [provider]  citalopram (CELEXA) 20 MG tablet Take 20 mg by mouth daily.    [provider]  diclofenac sodium (VOLTAREN) 1 % GEL Apply 2-4 g topically 4 (four) times daily as needed. For pain.    [provider]  dicyclomine (BENTYL) 20 MG tablet Take 20 mg by mouth every 6 (six) hours.    [provider]  famotidine (PEPCID) 20 MG tablet Take 20 mg by mouth daily.  01/03/16   [provider]  feeding supplement, GLUCERNA SHAKE, (GLUCERNA SHAKE) LIQD Take 237 mLs by mouth 4 (four) times daily.    [provider]  finasteride (PROSCAR) 5 MG tablet Take 5 mg by mouth daily.    [provider]  fluticasone (FLOVENT HFA) 110 MCG/ACT inhaler Inhale 2 puffs into the lungs 2 (two) times daily.    [provider]  gabapentin (NEURONTIN) 300 MG capsule Take 300 mg by mouth at bedtime.    [provider]  hydrocortisone 2.5 % cream Apply 1 application topically 2 (two) times daily as needed for itching.     [provider]  insulin degludec (TRESIBA FLEXTOUCH) 100 UNIT/ML SOPN FlexTouch Pen Inject 80 Units into the skin at bedtime.     [provider]  ketoconazole (NIZORAL) 2 % shampoo Apply 1 application topically 2 (two) times a week. tues and thursday 08/27/14   [provider]  loratadine (CLARITIN) 10 MG tablet Take 10 mg by mouth daily.    [provider]  Melatonin 5 MG TABS Take 10 mg by mouth at bedtime as needed (sleep).    [provider]  metFORMIN (GLUCOPHAGE-XR) 750 MG 24 hr tablet Take 750 mg by mouth 2 (two) times daily.    [provider]    metoprolol tartrate (LOPRESSOR) 100 MG tablet Take 150 mg by mouth 2 (two) times daily.     [provider]  miconazole (MICOTIN) 2 % cream Apply 1 application topically 2 (two) times daily.    [provider]  mirtazapine (REMERON) 45 MG tablet Take 45 mg by mouth at bedtime.    [provider]  Multiple Vitamin (MULTIVITAMIN) tablet Take 1 tablet by mouth daily.    [provider]  naloxone [facility]) nasal spray 4 mg/0.1 mL One spray in either nostril once for known/suspected opioid overdose. May repeat every 2-3 minutes in  alternating nostril til EMS arrives 01/12/16   [provider]  nicotine (NICODERM CQ - DOSED IN MG/24 HOURS) 14 mg/24hr patch Place 14 mg onto the skin daily.    [provider]  nicotine polacrilex (COMMIT) 4 MG lozenge Take 4 mg by mouth every 2 (two) hours as needed for smoking cessation.    [provider]  nitroGLYCERIN (NITROSTAT) 0.4 MG SL tablet DISSOLVE 1 TABLET UNDER THE TONGUE EVERY 5 MINUTES AS NEEDED FOR CHEST PAIN. CALL 911 IF CHEST IS NOT RESOLVED WITH 3 TABLETS 05/17/17   Antonieta Iba, MD  nortriptyline (PAMELOR) 25 MG capsule Take 50 mg by mouth at bedtime.    [provider]  NOVOLOG FLEXPEN 100 UNIT/ML FlexPen Inject 12 Units into the skin 3 (three) times daily with meals. With every meal and every snack 12/13/15   [provider]  nystatin-triamcinolone (MYCOLOG II) cream Apply 1 application topically 2 (two) times daily.    [provider]  omega-3 acid ethyl esters (LOVAZA) 1 g capsule Take 1 g by mouth daily.    [provider]  omeprazole (PRILOSEC) 20 MG capsule Take 20 mg by mouth daily.    [provider]  ondansetron (ZOFRAN-ODT) 4 MG disintegrating tablet Take 4 mg by mouth every 8 (eight) hours as needed for nausea or vomiting.    [provider]  Oxycodone HCl 10 MG TABS Take 10-15 mg by mouth every 4 (four) hours as needed (pain).     [provider]  potassium chloride SA (K-DUR,KLOR-CON) 20 MEQ tablet take 1 tablet by mouth once daily if needed Patient taking differently: take 1 tablet by mouth once daily 02/08/16   Antonieta Iba, MD  prednisoLONE acetate (PRED FORTE) 1 % ophthalmic suspension Place 1 drop into the right eye daily.     [provider]  solifenacin (VESICARE) 10 MG tablet Take 10 mg by mouth daily.    [provider]  tiZANidine (ZANAFLEX) 4 MG capsule Take 4 mg by mouth 3 (three) times daily as needed for muscle spasms.    [provider]    Allergies Penicillins and Erythromycin base  Family History  Family history unknown: Yes    Social History Social History   Tobacco Use  . Smoking status: Current Every Day Smoker    Years: 0.00    Types: Pipe  . Smokeless tobacco: Never Used  . Tobacco comment: quit smoking friday 08/18/2016  Substance Use Topics  . Alcohol use: No    Alcohol/week: 0.0 oz    Comment: used to be moderate  . Drug use: No    Review of Systems  Constitutional: No fever/chills Eyes: No visual changes. ENT: No sore throat. Cardiovascular: Denies chest pain. Respiratory: Denies shortness of breath. Gastrointestinal: see history of present illness Genitourinary: Negative for dysuria. Musculoskeletal: Negative for back pain. Skin: Negative for rash. Neurological: Negative for headaches, focal weakness  ____________________________________________   PHYSICAL EXAM:  VITAL SIGNS: ED Triage Vitals  Enc Vitals Group     BP 05/19/17 2041 (!) 148/86     Pulse Rate 05/19/17 2041 (!) 110     Resp 05/19/17 2041 19     Temp 05/19/17 2041 98.2 F (36.8 C)     Temp Source 05/19/17 2041 Oral     SpO2 05/19/17 2036 100 %     Weight 05/19/17 2041 175 lb (79.4 kg)     Height 05/19/17 2041 5\' 3"  (1.6 m)  Head Circumference --      Peak Flow --      Pain Score 05/19/17 2041 0     Pain Loc --      Pain Edu? --      Excl. in GC?  --     Constitutional: Alert and oriented. Well appearing and in no acute distress. Eyes: Conjunctivae are normal.  Head: Atraumatic. Nose: No congestion/rhinnorhea. Mouth/Throat: Mucous membranes are moist.  Oropharynx non-erythematous. Neck: No stridor.  Cardiovascular: Normal rate, regular rhythm. Grossly normal heart sounds.  Good peripheral circulation. Respiratory: Normal respiratory effort.  No retractions. Lungs CTAB. Gastrointestinal: Soft tender to palpation on the left side. No distention. No abdominal bruits. No CVA tenderness. Musculoskeletal: No lower extremity tenderness nor edema.  No joint effusions. Neurologic:  Normal speech and language. No gross focal neurologic deficits are appreciated. No gait instability. Skin:  Skin is warm, dry and intact. No rash noted. Psychiatric: Mood and affect are normal. Speech and behavior are normal.  ____________________________________________   LABS (all labs ordered are listed, but only abnormal results are displayed)  Labs Reviewed  C DIFFICILE QUICK SCREEN W PCR REFLEX  GASTROINTESTINAL PANEL BY PCR, STOOL (REPLACES STOOL CULTURE)  LIPASE, BLOOD  COMPREHENSIVE METABOLIC PANEL  CBC  URINALYSIS, COMPLETE (UACMP) WITH MICROSCOPIC   ____________________________________________  EKG  ____________________________________________  RADIOLOGY  ED MD interpretation:  CT shows colitis otherwise resolving postop changes. Patient's wound is however getting contaminated repeatedly with his diarrhea may have been unable to keep that from happening.  Official radiology report(s): No results found.  ____________________________________________   PROCEDURES  Procedure(s) performed:  Procedures  Critical Care performed:   ____________________________________________   INITIAL IMPRESSION / ASSESSMENT AND PLAN / ED COURSE  because of the patient's abdominal pain continued colitis continued diarrhea and the wound soilage  we will admit him to try get these cleared up. Patient has a history of HIV illness as well and chronic abdominal pain.        ____________________________________________   FINAL CLINICAL IMPRESSION(S) / ED DIAGNOSES  Final diagnoses:  None     ED Discharge Orders    None       Note:  This document was prepared using Dragon voice recognition software and may include unintentional dictation errors.    Arnaldo Natal, MD 05/19/17 2342

## 2017-05-19 NOTE — ED Notes (Signed)
Pt continues to call out in pain. Pt verbalized he understands the plan. CT called

## 2017-05-19 NOTE — ED Notes (Signed)
Pt drinking contrast at this time. Pt in NAD.

## 2017-05-20 ENCOUNTER — Other Ambulatory Visit: Payer: Self-pay

## 2017-05-20 DIAGNOSIS — G40909 Epilepsy, unspecified, not intractable, without status epilepticus: Secondary | ICD-10-CM | POA: Diagnosis present

## 2017-05-20 DIAGNOSIS — Z947 Corneal transplant status: Secondary | ICD-10-CM | POA: Diagnosis not present

## 2017-05-20 DIAGNOSIS — G4733 Obstructive sleep apnea (adult) (pediatric): Secondary | ICD-10-CM | POA: Diagnosis present

## 2017-05-20 DIAGNOSIS — Z880 Allergy status to penicillin: Secondary | ICD-10-CM | POA: Diagnosis not present

## 2017-05-20 DIAGNOSIS — Z21 Asymptomatic human immunodeficiency virus [HIV] infection status: Secondary | ICD-10-CM | POA: Diagnosis present

## 2017-05-20 DIAGNOSIS — K5732 Diverticulitis of large intestine without perforation or abscess without bleeding: Secondary | ICD-10-CM | POA: Diagnosis present

## 2017-05-20 DIAGNOSIS — Z881 Allergy status to other antibiotic agents status: Secondary | ICD-10-CM | POA: Diagnosis not present

## 2017-05-20 DIAGNOSIS — K219 Gastro-esophageal reflux disease without esophagitis: Secondary | ICD-10-CM | POA: Diagnosis present

## 2017-05-20 DIAGNOSIS — Z7951 Long term (current) use of inhaled steroids: Secondary | ICD-10-CM | POA: Diagnosis not present

## 2017-05-20 DIAGNOSIS — N400 Enlarged prostate without lower urinary tract symptoms: Secondary | ICD-10-CM | POA: Diagnosis present

## 2017-05-20 DIAGNOSIS — E785 Hyperlipidemia, unspecified: Secondary | ICD-10-CM | POA: Diagnosis present

## 2017-05-20 DIAGNOSIS — I10 Essential (primary) hypertension: Secondary | ICD-10-CM | POA: Diagnosis present

## 2017-05-20 DIAGNOSIS — K529 Noninfective gastroenteritis and colitis, unspecified: Secondary | ICD-10-CM | POA: Diagnosis present

## 2017-05-20 DIAGNOSIS — G894 Chronic pain syndrome: Secondary | ICD-10-CM | POA: Diagnosis present

## 2017-05-20 DIAGNOSIS — F17200 Nicotine dependence, unspecified, uncomplicated: Secondary | ICD-10-CM | POA: Diagnosis present

## 2017-05-20 DIAGNOSIS — Z6831 Body mass index (BMI) 31.0-31.9, adult: Secondary | ICD-10-CM | POA: Diagnosis not present

## 2017-05-20 DIAGNOSIS — Z794 Long term (current) use of insulin: Secondary | ICD-10-CM | POA: Diagnosis not present

## 2017-05-20 DIAGNOSIS — L409 Psoriasis, unspecified: Secondary | ICD-10-CM | POA: Diagnosis present

## 2017-05-20 DIAGNOSIS — E119 Type 2 diabetes mellitus without complications: Secondary | ICD-10-CM | POA: Diagnosis present

## 2017-05-20 DIAGNOSIS — E872 Acidosis: Secondary | ICD-10-CM | POA: Diagnosis present

## 2017-05-20 DIAGNOSIS — E876 Hypokalemia: Secondary | ICD-10-CM | POA: Diagnosis present

## 2017-05-20 DIAGNOSIS — E669 Obesity, unspecified: Secondary | ICD-10-CM | POA: Diagnosis present

## 2017-05-20 DIAGNOSIS — Z8673 Personal history of transient ischemic attack (TIA), and cerebral infarction without residual deficits: Secondary | ICD-10-CM | POA: Diagnosis not present

## 2017-05-20 DIAGNOSIS — M1990 Unspecified osteoarthritis, unspecified site: Secondary | ICD-10-CM | POA: Diagnosis present

## 2017-05-20 LAB — URINALYSIS, COMPLETE (UACMP) WITH MICROSCOPIC
BILIRUBIN URINE: NEGATIVE
Bacteria, UA: NONE SEEN
HGB URINE DIPSTICK: NEGATIVE
Ketones, ur: NEGATIVE mg/dL
LEUKOCYTES UA: NEGATIVE
NITRITE: NEGATIVE
PH: 5 (ref 5.0–8.0)
Protein, ur: NEGATIVE mg/dL
Specific Gravity, Urine: >1.046 — ABNORMAL HIGH (ref 1.005–1.030)

## 2017-05-20 LAB — BASIC METABOLIC PANEL
Anion gap: 10 (ref 5–15)
BUN: 12 mg/dL (ref 6–20)
CALCIUM: 9.2 mg/dL (ref 8.9–10.3)
CO2: 26 mmol/L (ref 22–32)
CREATININE: 0.74 mg/dL (ref 0.61–1.24)
Chloride: 97 mmol/L — ABNORMAL LOW (ref 101–111)
GFR calc Af Amer: >60 mL/min (ref >60)
Glucose, Bld: 186 mg/dL — ABNORMAL HIGH (ref 65–99)
Potassium: 3.2 mmol/L — ABNORMAL LOW (ref 3.5–5.1)
SODIUM: 133 mmol/L — AB (ref 135–145)

## 2017-05-20 LAB — CBC
HCT: 32.3 % — ABNORMAL LOW (ref 40.0–52.0)
Hemoglobin: 10.9 g/dL — ABNORMAL LOW (ref 13.0–18.0)
MCH: 28.6 pg (ref 26.0–34.0)
MCHC: 33.8 g/dL (ref 32.0–36.0)
MCV: 84.5 fL (ref 80.0–100.0)
PLATELETS: 730 10*3/uL — AB (ref 150–440)
RBC: 3.82 MIL/uL — ABNORMAL LOW (ref 4.40–5.90)
RDW: 14.3 % (ref 11.5–14.5)
WBC: 9 10*3/uL (ref 3.8–10.6)

## 2017-05-20 LAB — GLUCOSE, CAPILLARY
GLUCOSE-CAPILLARY: 307 mg/dL — AB (ref 65–99)
Glucose-Capillary: 231 mg/dL — ABNORMAL HIGH (ref 65–99)
Glucose-Capillary: 246 mg/dL — ABNORMAL HIGH (ref 65–99)
Glucose-Capillary: 223 mg/dL — ABNORMAL HIGH (ref 65–99)

## 2017-05-20 LAB — LACTIC ACID, PLASMA
LACTIC ACID, VENOUS: 3.2 mmol/L — AB (ref 0.5–1.9)
Lactic Acid, Venous: 3.2 mmol/L (ref 0.5–1.9)

## 2017-05-20 MED ORDER — GABAPENTIN 300 MG PO CAPS
300.0000 mg | ORAL_CAPSULE | Freq: Every day | ORAL | Status: DC
  Administered 2017-05-20: 300 mg via ORAL
  Filled 2017-05-20: qty 1

## 2017-05-20 MED ORDER — ACETAMINOPHEN 325 MG PO TABS: 650.0000 mg | ORAL_TABLET | Freq: Four times a day (QID) | ORAL | Status: DC | PRN

## 2017-05-20 MED ORDER — NICOTINE 14 MG/24HR TD PT24
14.0000 mg | MEDICATED_PATCH | Freq: Every day | TRANSDERMAL | Status: DC
  Administered 2017-05-20 – 2017-05-22 (×3): 14 mg via TRANSDERMAL
  Filled 2017-05-20 (×3): qty 1

## 2017-05-20 MED ORDER — FAMOTIDINE 20 MG PO TABS
20.0000 mg | ORAL_TABLET | Freq: Two times a day (BID) | ORAL | Status: DC
  Administered 2017-05-20 – 2017-05-22 (×5): 20 mg via ORAL
  Filled 2017-05-20 (×5): qty 1

## 2017-05-20 MED ORDER — ACETAMINOPHEN 650 MG RE SUPP: 650.0000 mg | Freq: Four times a day (QID) | RECTAL | Status: DC | PRN

## 2017-05-20 MED ORDER — LORATADINE 10 MG PO TABS
10.0000 mg | ORAL_TABLET | Freq: Every day | ORAL | Status: DC
  Administered 2017-05-20 – 2017-05-22 (×3): 10 mg via ORAL
  Filled 2017-05-20 (×3): qty 1

## 2017-05-20 MED ORDER — DOCUSATE SODIUM 100 MG PO CAPS
100.0000 mg | ORAL_CAPSULE | Freq: Two times a day (BID) | ORAL | Status: DC
  Administered 2017-05-20 – 2017-05-22 (×4): 100 mg via ORAL
  Filled 2017-05-20 (×5): qty 1

## 2017-05-20 MED ORDER — ACYCLOVIR 400 MG PO TABS
400.0000 mg | ORAL_TABLET | Freq: Two times a day (BID) | ORAL | Status: DC
  Filled 2017-05-20 (×2): qty 1

## 2017-05-20 MED ORDER — BUDESONIDE 0.5 MG/2ML IN SUSP
0.5000 mg | Freq: Two times a day (BID) | RESPIRATORY_TRACT | Status: DC
  Administered 2017-05-20 – 2017-05-22 (×5): 0.5 mg via RESPIRATORY_TRACT
  Filled 2017-05-20 (×6): qty 2

## 2017-05-20 MED ORDER — NORTRIPTYLINE HCL 25 MG PO CAPS
50.0000 mg | ORAL_CAPSULE | Freq: Every day | ORAL | Status: DC
  Administered 2017-05-20 – 2017-05-21 (×2): 50 mg via ORAL
  Filled 2017-05-20 (×3): qty 2

## 2017-05-20 MED ORDER — ALBUTEROL SULFATE (2.5 MG/3ML) 0.083% IN NEBU: 2.5000 mg | INHALATION_SOLUTION | RESPIRATORY_TRACT | Status: DC | PRN

## 2017-05-20 MED ORDER — DICYCLOMINE HCL 20 MG PO TABS
20.0000 mg | ORAL_TABLET | Freq: Four times a day (QID) | ORAL | Status: DC
  Administered 2017-05-20 – 2017-05-22 (×9): 20 mg via ORAL
  Filled 2017-05-20 (×12): qty 1

## 2017-05-20 MED ORDER — HYDROMORPHONE HCL 1 MG/ML IJ SOLN
0.5000 mg | INTRAMUSCULAR | Status: DC | PRN
  Administered 2017-05-20 (×2): 0.5 mg via INTRAVENOUS
  Filled 2017-05-20 (×2): qty 0.5

## 2017-05-20 MED ORDER — DIPHENHYDRAMINE HCL 25 MG PO CAPS
50.0000 mg | ORAL_CAPSULE | Freq: Four times a day (QID) | ORAL | Status: DC | PRN
Start: 2017-05-20 — End: 2017-05-22
  Administered 2017-05-20 (×2): 50 mg via ORAL
  Filled 2017-05-20 (×2): qty 2

## 2017-05-20 MED ORDER — CITALOPRAM HYDROBROMIDE 20 MG PO TABS
20.0000 mg | ORAL_TABLET | Freq: Every day | ORAL | Status: DC
  Administered 2017-05-20 – 2017-05-22 (×3): 20 mg via ORAL
  Filled 2017-05-20 (×3): qty 1

## 2017-05-20 MED ORDER — ACYCLOVIR 200 MG PO CAPS
200.0000 mg | ORAL_CAPSULE | Freq: Two times a day (BID) | ORAL | Status: DC
  Filled 2017-05-20: qty 1

## 2017-05-20 MED ORDER — GLUCERNA SHAKE PO LIQD
237.0000 mL | Freq: Four times a day (QID) | ORAL | Status: DC
  Administered 2017-05-20 – 2017-05-22 (×9): 237 mL via ORAL

## 2017-05-20 MED ORDER — PANTOPRAZOLE SODIUM 40 MG PO TBEC
40.0000 mg | DELAYED_RELEASE_TABLET | Freq: Every day | ORAL | Status: DC
  Administered 2017-05-20 – 2017-05-22 (×3): 40 mg via ORAL
  Filled 2017-05-20 (×3): qty 1

## 2017-05-20 MED ORDER — POTASSIUM CHLORIDE IN NACL 40-0.9 MEQ/L-% IV SOLN
INTRAVENOUS | Status: DC
  Administered 2017-05-20: 100 mL/h via INTRAVENOUS
  Administered 2017-05-20: 75 mL/h via INTRAVENOUS
  Administered 2017-05-21 – 2017-05-22 (×2): 100 mL/h via INTRAVENOUS
  Filled 2017-05-20 (×8): qty 1000

## 2017-05-20 MED ORDER — FINASTERIDE 5 MG PO TABS
5.0000 mg | ORAL_TABLET | Freq: Every day | ORAL | Status: DC
  Administered 2017-05-20 – 2017-05-22 (×3): 5 mg via ORAL
  Filled 2017-05-20 (×3): qty 1

## 2017-05-20 MED ORDER — MIRTAZAPINE 15 MG PO TABS
45.0000 mg | ORAL_TABLET | Freq: Every day | ORAL | Status: DC
  Administered 2017-05-20 – 2017-05-21 (×2): 45 mg via ORAL
  Filled 2017-05-20 (×2): qty 3

## 2017-05-20 MED ORDER — ALFUZOSIN HCL ER 10 MG PO TB24
10.0000 mg | ORAL_TABLET | Freq: Every day | ORAL | Status: DC
  Administered 2017-05-20 – 2017-05-22 (×3): 10 mg via ORAL
  Filled 2017-05-20 (×3): qty 1

## 2017-05-20 MED ORDER — SODIUM CHLORIDE 0.9 % IV SOLN: INTRAVENOUS | Status: DC

## 2017-05-20 MED ORDER — HEPARIN SODIUM (PORCINE) 5000 UNIT/ML IJ SOLN
5000.0000 [IU] | Freq: Three times a day (TID) | INTRAMUSCULAR | Status: DC
  Administered 2017-05-20 – 2017-05-22 (×8): 5000 [IU] via SUBCUTANEOUS
  Filled 2017-05-20 (×8): qty 1

## 2017-05-20 MED ORDER — FLUTICASONE PROPIONATE HFA 110 MCG/ACT IN AERO: 2.0000 | INHALATION_SPRAY | Freq: Two times a day (BID) | RESPIRATORY_TRACT | Status: DC

## 2017-05-20 MED ORDER — ADULT MULTIVITAMIN W/MINERALS CH
1.0000 | ORAL_TABLET | Freq: Every day | ORAL | Status: DC
  Administered 2017-05-20 – 2017-05-22 (×3): 1 via ORAL
  Filled 2017-05-20 (×3): qty 1

## 2017-05-20 MED ORDER — POTASSIUM CHLORIDE 20 MEQ PO PACK
40.0000 meq | PACK | Freq: Once | ORAL | Status: AC
  Administered 2017-05-20: 40 meq via ORAL
  Filled 2017-05-20: qty 2

## 2017-05-20 MED ORDER — ASPIRIN EC 81 MG PO TBEC
81.0000 mg | DELAYED_RELEASE_TABLET | Freq: Every day | ORAL | Status: DC
  Administered 2017-05-20 – 2017-05-22 (×3): 81 mg via ORAL
  Filled 2017-05-20 (×3): qty 1

## 2017-05-20 MED ORDER — HYDROCODONE-ACETAMINOPHEN 5-325 MG PO TABS
1.0000 | ORAL_TABLET | ORAL | Status: DC | PRN
  Administered 2017-05-20 – 2017-05-22 (×5): 2 via ORAL
  Filled 2017-05-20 (×5): qty 2

## 2017-05-20 MED ORDER — DICLOFENAC SODIUM 1 % TD GEL
2.0000 g | Freq: Four times a day (QID) | TRANSDERMAL | Status: DC | PRN
  Filled 2017-05-20: qty 100

## 2017-05-20 MED ORDER — SODIUM CHLORIDE 0.9 % IV BOLUS
1000.0000 mL | Freq: Once | INTRAVENOUS | Status: AC
  Administered 2017-05-20: 1000 mL via INTRAVENOUS

## 2017-05-20 MED ORDER — MELATONIN 5 MG PO TABS
10.0000 mg | ORAL_TABLET | Freq: Every evening | ORAL | Status: DC | PRN
  Filled 2017-05-20: qty 2

## 2017-05-20 MED ORDER — METOPROLOL TARTRATE 50 MG PO TABS
150.0000 mg | ORAL_TABLET | Freq: Two times a day (BID) | ORAL | Status: DC
  Administered 2017-05-20 – 2017-05-22 (×5): 150 mg via ORAL
  Filled 2017-05-20 (×5): qty 3

## 2017-05-20 MED ORDER — NITROGLYCERIN 0.4 MG SL SUBL: 0.4000 mg | SUBLINGUAL_TABLET | SUBLINGUAL | Status: DC | PRN

## 2017-05-20 MED ORDER — INSULIN ASPART 100 UNIT/ML ~~LOC~~ SOLN
0.0000 [IU] | Freq: Every day | SUBCUTANEOUS | Status: DC
Start: 2017-05-20 — End: 2017-05-22
  Administered 2017-05-20: 2 [IU] via SUBCUTANEOUS
  Filled 2017-05-20: qty 1

## 2017-05-20 MED ORDER — ALBUTEROL SULFATE HFA 108 (90 BASE) MCG/ACT IN AERS: 2.0000 | INHALATION_SPRAY | RESPIRATORY_TRACT | Status: DC | PRN

## 2017-05-20 MED ORDER — ACYCLOVIR 200 MG PO CAPS
400.0000 mg | ORAL_CAPSULE | Freq: Two times a day (BID) | ORAL | Status: DC
  Administered 2017-05-20 – 2017-05-22 (×5): 400 mg via ORAL
  Filled 2017-05-20 (×6): qty 2

## 2017-05-20 MED ORDER — INSULIN DEGLUDEC 100 UNIT/ML ~~LOC~~ SOPN: 60.0000 [IU] | PEN_INJECTOR | Freq: Every day | SUBCUTANEOUS | Status: DC

## 2017-05-20 MED ORDER — CIPROFLOXACIN IN D5W 400 MG/200ML IV SOLN
400.0000 mg | Freq: Two times a day (BID) | INTRAVENOUS | Status: DC
  Administered 2017-05-20 – 2017-05-22 (×6): 400 mg via INTRAVENOUS
  Filled 2017-05-20 (×7): qty 200

## 2017-05-20 MED ORDER — INSULIN GLARGINE 100 UNIT/ML ~~LOC~~ SOLN
60.0000 [IU] | Freq: Every day | SUBCUTANEOUS | Status: DC
  Administered 2017-05-20 – 2017-05-21 (×2): 60 [IU] via SUBCUTANEOUS
  Filled 2017-05-20 (×3): qty 0.6

## 2017-05-20 MED ORDER — BICTEGRAVIR-EMTRICITAB-TENOFOV 50-200-25 MG PO TABS
1.0000 | ORAL_TABLET | Freq: Every day | ORAL | Status: DC
  Administered 2017-05-20 – 2017-05-22 (×3): 1 via ORAL
  Filled 2017-05-20 (×3): qty 1

## 2017-05-20 MED ORDER — DARIFENACIN HYDROBROMIDE ER 7.5 MG PO TB24
7.5000 mg | ORAL_TABLET | Freq: Every day | ORAL | Status: DC
  Administered 2017-05-20 – 2017-05-22 (×3): 7.5 mg via ORAL
  Filled 2017-05-20 (×3): qty 1

## 2017-05-20 MED ORDER — METRONIDAZOLE IN NACL 5-0.79 MG/ML-% IV SOLN
500.0000 mg | Freq: Three times a day (TID) | INTRAVENOUS | Status: DC
  Administered 2017-05-20 – 2017-05-21 (×4): 500 mg via INTRAVENOUS
  Filled 2017-05-20 (×6): qty 100

## 2017-05-20 MED ORDER — INSULIN ASPART 100 UNIT/ML ~~LOC~~ SOLN
0.0000 [IU] | Freq: Three times a day (TID) | SUBCUTANEOUS | Status: DC
  Administered 2017-05-20 (×2): 3 [IU] via SUBCUTANEOUS
  Administered 2017-05-20: 7 [IU] via SUBCUTANEOUS
  Administered 2017-05-21 (×2): 2 [IU] via SUBCUTANEOUS
  Administered 2017-05-21: 5 [IU] via SUBCUTANEOUS
  Administered 2017-05-22: 1 [IU] via SUBCUTANEOUS
  Administered 2017-05-22: 2 [IU] via SUBCUTANEOUS
  Filled 2017-05-20 (×8): qty 1

## 2017-05-20 MED ORDER — ONDANSETRON HCL 4 MG PO TABS: 4.0000 mg | ORAL_TABLET | Freq: Four times a day (QID) | ORAL | Status: DC | PRN

## 2017-05-20 MED ORDER — BUPRENORPHINE 15 MCG/HR TD PTWK: 15.0000 mg | MEDICATED_PATCH | TRANSDERMAL | Status: DC

## 2017-05-20 MED ORDER — ONDANSETRON HCL 4 MG/2ML IJ SOLN: 4.0000 mg | Freq: Four times a day (QID) | INTRAMUSCULAR | Status: DC | PRN

## 2017-05-20 MED ORDER — BISACODYL 5 MG PO TBEC: 5.0000 mg | DELAYED_RELEASE_TABLET | Freq: Every day | ORAL | Status: DC | PRN

## 2017-05-20 MED ORDER — PREDNISOLONE ACETATE 1 % OP SUSP
1.0000 [drp] | Freq: Every day | OPHTHALMIC | Status: DC
  Administered 2017-05-20 – 2017-05-22 (×3): 1 [drp] via OPHTHALMIC
  Filled 2017-05-20: qty 1

## 2017-05-20 MED ORDER — BUPROPION HCL ER (XL) 150 MG PO TB24
150.0000 mg | ORAL_TABLET | Freq: Every day | ORAL | Status: DC
  Administered 2017-05-20 – 2017-05-22 (×3): 150 mg via ORAL
  Filled 2017-05-20 (×3): qty 1

## 2017-05-20 MED ORDER — OXYCODONE HCL 5 MG PO TABS
10.0000 mg | ORAL_TABLET | ORAL | Status: DC | PRN
  Administered 2017-05-20: 15 mg via ORAL
  Administered 2017-05-20: 10 mg via ORAL
  Administered 2017-05-20 – 2017-05-21 (×3): 15 mg via ORAL
  Filled 2017-05-20 (×2): qty 3
  Filled 2017-05-20: qty 2
  Filled 2017-05-20 (×2): qty 3

## 2017-05-20 NOTE — H&P (Signed)
[facility] - [place] at [facility]   PATIENT NAME: Cory Campbell    MR#:  [id number]  DATE OF BIRTH:  1980/12/05  DATE OF ADMISSION:  05/19/2017  PRIMARY CARE PHYSICIAN: Emogene Morgan, MD   REQUESTING/REFERRING PHYSICIAN:   CHIEF COMPLAINT:   Chief Complaint  Patient presents with  . Diarrhea    HISTORY OF PRESENT ILLNESS: Cory Campbell  is a 37 y.o. male with a known history of HIV, diabetes type 2, chronic pain syndrome, hypertension, seizure disorder and other medical comorbidities.  Patient was brought to emergency room for severe abdominal pain and diarrhea through his ostomy going on for the past 2-3 days.  No fever/chills no vomiting no bleeding.  He is status post recent surgery at [facility] for colostomy takedown with loop ileostomy and wound VAC placement.  Stool test done in emergency room are negative for C. difficile and other GI infections, however the abdominal CAT scan shows acute colitis.  Blood test done emergency room are remarkable for hypokalemia at 3.1. Patient is admitted for further evaluation and treatment.  PAST MEDICAL HISTORY:   Past Medical History:  Diagnosis Date  . Abrasion or friction burn of foot and toe(s), without mention of infection   . Acid reflux   . Adopted   . Allergic rhinitis, cause unspecified   . Anxiety   . Arthritis   . Arthrogryposis   . Asthma   . Bladder wall thickening   . BPH (benign prostatic hyperplasia)   . Chronic pain syndrome   . Depressive disorder, not elsewhere classified   . Diabetes mellitus without complication (HCC)   . Diverticulitis of colon (without mention of hemorrhage)(562.11)   . ED (erectile dysfunction)   . Herpes genitalis   . History of echocardiogram    a. 10/2016: EF 50-55%, normal wall motion  . History of stress test    a. 06/2011: significant GI uptake artifact, no evidence of ischemia, EF 56%  . HIV infection (HCC)   . HTN (hypertension)   . Hyperlipidemia   .  Hypogonadism in male   . Myalgia and myositis, unspecified   . OAB (overactive bladder)   . Obesity   . Other psoriasis   . Premature baby    born premature  . Seizure disorder (HCC)   . Sleep apnea   . 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 or other cause, without mention of thyrotoxic crisis or storm    hyperthyroidism  . TIA (transient ischemic attack)   . Type II or unspecified type diabetes mellitus with neurological manifestations, not stated as uncontrolled(250.60)     PAST SURGICAL HISTORY:  Past Surgical History:  Procedure Laterality Date  . APPENDECTOMY    . COLOSTOMY    . CORNEAL TRANSPLANT    . feet surgery     both  . HAND SURGERY     left and right  . leg surgery     left and right  . ORTHOPEDIC SURGERY     hands, feet, knees, legs  . tubes in ears     both    SOCIAL HISTORY:  Social History   Tobacco Use  . Smoking status: Current Every Day Smoker    Years: 0.00    Types: Pipe  . Smokeless tobacco: Never Used  . Tobacco comment: quit smoking friday 08/18/2016  Substance Use Topics  . Alcohol use: No    Alcohol/week: 0.0 oz    Comment: used to be moderate  FAMILY HISTORY:  Family History  Family history unknown: Yes    DRUG ALLERGIES:  Allergies  Allergen Reactions  . Penicillins Hives, Itching and Rash    Has patient had a PCN reaction causing immediate rash, facial/tongue/throat swelling, SOB or lightheadedness with hypotension: Yes Has patient had a PCN reaction causing severe rash involving mucus membranes or skin necrosis: No Has patient had a PCN reaction that required hospitalization No Has patient had a PCN reaction occurring within the last 10 years: No If all of the above answers are "NO", then may proceed with Cephalosporin use.   . Erythromycin Base Itching and Rash    REVIEW OF SYSTEMS:   CONSTITUTIONAL: No fever, fatigue or weakness.  EYES: No blurred or double vision.  EARS, NOSE, AND THROAT: No tinnitus or ear  pain.  RESPIRATORY: No cough, shortness of breath, wheezing or hemoptysis.  CARDIOVASCULAR: No chest pain, orthopnea, edema.  GASTROINTESTINAL: No nausea, vomiting.  Positive for diarrhea and abdominal pain.  GENITOURINARY: No dysuria, hematuria.  ENDOCRINE: No polyuria, nocturia,  HEMATOLOGY: No bleeding SKIN: Positive for mid abdominal surgical wound status post recent surgery. MUSCULOSKELETAL: No joint pain.  Severe muscular atrophy in lower extremities noted. NEUROLOGIC: Patient is wheelchair-bound at baseline, since childhood.  PSYCHIATRY: Positive history of anxiety.   MEDICATIONS AT HOME:  Prior to Admission medications   Medication Sig Start Date End Date Taking? Authorizing Provider  acyclovir (ZOVIRAX) 400 MG tablet Take 400 mg by mouth 2 (two) times daily.   Yes [provider]  albuterol (PROVENTIL HFA;VENTOLIN HFA) 108 (90 BASE) MCG/ACT inhaler Inhale 2 puffs into the lungs every 4 (four) hours as needed for wheezing or shortness of breath.    Yes [provider]  albuterol (PROVENTIL) (2.5 MG/3ML) 0.083% nebulizer solution Take 2.5 mg by nebulization every 4 (four) hours as needed for wheezing or shortness of breath.   Yes [provider]  alfuzosin (UROXATRAL) 10 MG 24 hr tablet Take 10 mg by mouth daily.   Yes [provider]  aspirin 81 MG tablet Take 81 mg by mouth daily.   Yes [provider]  BIKTARVY 50-200-25 MG TABS tablet Take 1 tablet by mouth daily. 11/21/16  Yes [provider]  Buprenorphine (BUTRANS) 15 MCG/HR PTWK Place 15 mg onto the skin once a week. Saturday   Yes [provider]  buPROPion (WELLBUTRIN XL) 150 MG 24 hr tablet Take 1 tablet by mouth daily. 11/16/16  Yes [provider]  citalopram (CELEXA) 20 MG tablet Take 20 mg by mouth daily.   Yes [provider]  diclofenac sodium (VOLTAREN) 1 % GEL Apply 2-4 g topically 4 (four) times daily as needed. For pain.   Yes  [provider]  dicyclomine (BENTYL) 20 MG tablet Take 20 mg by mouth every 6 (six) hours.   Yes [provider]  famotidine (PEPCID) 20 MG tablet Take 20 mg by mouth daily.  01/03/16  Yes [provider]  feeding supplement, GLUCERNA SHAKE, (GLUCERNA SHAKE) LIQD Take 237 mLs by mouth 4 (four) times daily.   Yes [provider]  finasteride (PROSCAR) 5 MG tablet Take 5 mg by mouth daily.   Yes [provider]  fluticasone (FLOVENT HFA) 110 MCG/ACT inhaler Inhale 2 puffs into the lungs 2 (two) times daily.   Yes [provider]  gabapentin (NEURONTIN) 300 MG capsule Take 300 mg by mouth at bedtime.   Yes [provider]  hydrocortisone 2.5 % cream Apply 1  application topically 2 (two) times daily as needed for itching.    Yes [provider]  insulin degludec (TRESIBA FLEXTOUCH) 100 UNIT/ML SOPN FlexTouch Pen Inject 80 Units into the skin at bedtime.    Yes [provider]  ketoconazole (NIZORAL) 2 % shampoo Apply 1 application topically 2 (two) times a week. tues and thursday 08/27/14  Yes [provider]  loratadine (CLARITIN) 10 MG tablet Take 10 mg by mouth daily.   Yes [provider]  Melatonin 5 MG TABS Take 10 mg by mouth at bedtime as needed (sleep).   Yes [provider]  metFORMIN (GLUCOPHAGE-XR) 750 MG 24 hr tablet Take 750 mg by mouth 2 (two) times daily.   Yes [provider]  metoprolol tartrate (LOPRESSOR) 100 MG tablet Take 150 mg by mouth 2 (two) times daily.    Yes [provider]  miconazole (MICOTIN) 2 % cream Apply 1 application topically 2 (two) times daily.   Yes [provider]  mirtazapine (REMERON) 45 MG tablet Take 45 mg by mouth at bedtime.   Yes [provider]  Multiple Vitamin (MULTIVITAMIN) tablet Take 1 tablet by mouth daily.   Yes [provider]  naloxone [facility]) nasal spray 4 mg/0.1 mL One spray in either nostril  once for known/suspected opioid overdose. May repeat every 2-3 minutes in alternating nostril til EMS arrives 01/12/16  Yes [provider]  nicotine (NICODERM CQ - DOSED IN MG/24 HOURS) 14 mg/24hr patch Place 14 mg onto the skin daily.   Yes [provider]  nicotine polacrilex (COMMIT) 4 MG lozenge Take 4 mg by mouth every 2 (two) hours as needed for smoking cessation.   Yes [provider]  nitroGLYCERIN (NITROSTAT) 0.4 MG SL tablet DISSOLVE 1 TABLET UNDER THE TONGUE EVERY 5 MINUTES AS NEEDED FOR CHEST PAIN. CALL 911 IF CHEST IS NOT RESOLVED WITH 3 TABLETS 05/17/17  Yes Gollan, Tollie Pizza, MD  nortriptyline (PAMELOR) 25 MG capsule Take 50 mg by mouth at bedtime.   Yes [provider]  NOVOLOG FLEXPEN 100 UNIT/ML FlexPen Inject 12 Units into the skin 3 (three) times daily with meals. With every meal and every snack 12/13/15  Yes [provider]  nystatin-triamcinolone (MYCOLOG II) cream Apply 1 application topically 2 (two) times daily.   Yes [provider]  omega-3 acid ethyl esters (LOVAZA) 1 g capsule Take 1 g by mouth daily.   Yes [provider]  omeprazole (PRILOSEC) 20 MG capsule Take 20 mg by mouth daily.   Yes [provider]  ondansetron (ZOFRAN-ODT) 4 MG disintegrating tablet Take 4 mg by mouth every 8 (eight) hours as needed for nausea or vomiting.   Yes [provider]  Oxycodone HCl 10 MG TABS Take 10-15 mg by mouth every 4 (four) hours as needed (pain).   Yes [provider]  potassium chloride SA (K-DUR,KLOR-CON) 20 MEQ tablet take 1 tablet by mouth once daily if needed Patient taking differently: take 1 tablet by mouth once daily 02/08/16  Yes Gollan, Tollie Pizza, MD  prednisoLONE acetate (PRED FORTE) 1 % ophthalmic suspension Place 1 drop into the right eye daily.    Yes [provider]  solifenacin (VESICARE) 10 MG tablet Take 10 mg by mouth daily.   Yes [provider]  tiZANidine  (ZANAFLEX) 4 MG capsule Take 4 mg by mouth 3 (three) times daily as needed for muscle spasms.   Yes [provider]  PHYSICAL EXAMINATION:   VITAL SIGNS: Blood pressure (!) 135/93, pulse 75, temperature 97.7 F (36.5 C), temperature source Oral, resp. rate 20, height 5\' 3"  (1.6 m), weight 79.3 kg (174 lb 12.8 oz), SpO2 96 %.  GENERAL:  37 y.o.-year-old patient lying in the bed, in moderate distress, secondary to abdominal pain and diarrhea.  EYES: Pupils equal, round, reactive to light and accommodation. No scleral icterus.  HEENT: Head atraumatic, normocephalic. Oropharynx and nasopharynx clear.  NECK:  Supple, no jugular venous distention. No thyroid enlargement, no tenderness.  LUNGS: Normal breath sounds bilaterally, no wheezing, rales,rhonchi or crepitation. No use of accessory muscles of respiration.  CARDIOVASCULAR: S1, S2 normal. No S3/S4.  ABDOMEN: There is tenderness with palpation, diffusely in the lower abdomen.  Ileostomy in place with copious diarrhea noted in the bag.  Mid abdominal surgical wound is noted, healing well, without any bleeding or discharge. EXTREMITIES: No pedal edema, cyanosis, or clubbing.  NEUROLOGIC: Paraparesis, chronic, since childhood. PSYCHIATRIC: The patient is alert and oriented x 3.  SKIN: Mid abdominal surgical wound is noted, healing well, without any bleeding or discharge.   LABORATORY PANEL:   CBC Recent Labs  Lab 05/15/17 1618  05/16/17 0422 05/16/17 1521 05/17/17 0558 05/19/17 2120 05/20/17 0415  WBC 12.9*   < > 7.3 6.3 4.7 9.5 9.0  HGB 11.3*   < > 10.5* 9.2* 9.8* 11.1* 10.9*  HCT 34.0*   < > 31.8* 28.3* 29.9* 33.0* 32.3*  PLT 870*   < > 805* 798* 743* 742* 730*  MCV 84.4   < > 84.4 85 85.0 83.9 84.5  MCH 27.9   < > 27.7 27.7 27.8 28.1 28.6  MCHC 33.1   < > 32.9 32.5 32.8 33.5 33.8  RDW 14.0   < > 14.0 13.8 14.0 14.1 14.3  LYMPHSABS 1.1  --   --  0.7  --   --   --   MONOABS 0.8  --   --   --   --   --   --   EOSABS  0.0  --   --  0.0  --   --   --   BASOSABS 0.0  --   --  0.0  --   --   --    < > = values in this interval not displayed.   ------------------------------------------------------------------------------------------------------------------  Chemistries  Recent Labs  Lab 05/15/17 1618 05/16/17 0034 05/16/17 0422 05/17/17 0558 05/19/17 2120 05/20/17 0415  NA 125*  --  130* 136 133* 133*  K 4.9  --  5.1 3.9 3.1* 3.2*  CL 91*  --  101 106 99* 97*  CO2 19*  --  18* 23 24 26   GLUCOSE 164*  --  256* 179* 190* 186*  BUN 54*  --  48* 24* 15 12  CREATININE 4.76* 3.02* 2.32* 0.96 0.73 0.74  CALCIUM 9.4  --  8.6* 8.7* 9.3 9.2  AST 20  --   --   --  31  --   ALT 18  --   --   --  22  --   ALKPHOS 99  --   --   --  71  --   BILITOT 0.7  --   --   --  0.2*  --    ------------------------------------------------------------------------------------------------------------------ estimated creatinine clearance is 117.8 mL/min (by C-G formula based on SCr of 0.74 mg/dL). ------------------------------------------------------------------------------------------------------------------ No results for input(s): TSH, T4TOTAL, T3FREE, THYROIDAB in the last 72 hours.  Invalid input(s):  FREET3   Coagulation profile No results for input(s): INR, PROTIME in the last 168 hours. ------------------------------------------------------------------------------------------------------------------- No results for input(s): DDIMER in the last 72 hours. -------------------------------------------------------------------------------------------------------------------  Cardiac Enzymes Recent Labs  Lab 05/16/17 0034  TROPONINI <0.03   ------------------------------------------------------------------------------------------------------------------ Invalid input(s): POCBNP  ---------------------------------------------------------------------------------------------------------------  Urinalysis     Component Value Date/Time   COLORURINE YELLOW (A) 05/19/2017 2120   APPEARANCEUR CLEAR (A) 05/19/2017 2120   APPEARANCEUR Cloudy 05/16/2014 0350   LABSPEC >1.046 (H) 05/19/2017 2120   LABSPEC 1.018 05/16/2014 0350   PHURINE 5.0 05/19/2017 2120   GLUCOSEU >=500 (A) 05/19/2017 2120   GLUCOSEU Negative 05/16/2014 0350   HGBUR NEGATIVE 05/19/2017 2120   BILIRUBINUR NEGATIVE 05/19/2017 2120   BILIRUBINUR Negative 05/16/2014 0350   KETONESUR NEGATIVE 05/19/2017 2120   PROTEINUR NEGATIVE 05/19/2017 2120   NITRITE NEGATIVE 05/19/2017 2120   LEUKOCYTESUR NEGATIVE 05/19/2017 2120   LEUKOCYTESUR 1+ 05/16/2014 0350     RADIOLOGY: Ct Abdomen Pelvis W Contrast  Result Date: 05/19/2017 CLINICAL DATA:  Abdominal infection. Diarrhea. Recent colostomy takedown and ileostomy creation. EXAM: CT ABDOMEN AND PELVIS WITH CONTRAST TECHNIQUE: Multidetector CT imaging of the abdomen and pelvis was performed using the standard protocol following bolus administration of intravenous contrast. CONTRAST:  OMNIPAQUE IOHEXOL 300 MG/ML  SOLN COMPARISON:  CT 05/15/2017 FINDINGS: Lower chest: Minimal subpleural atelectasis in the left lower lobe. No pleural fluid or consolidation. Hepatobiliary: No focal hepatic lesion. Gallstones without gallbladder inflammation or biliary dilatation. Pancreas: No ductal dilatation or inflammation. Spleen: Normal in size without focal abnormality. Adrenals/Urinary Tract: No adrenal nodule. No hydronephrosis or perinephric edema. Homogeneous renal enhancement with symmetric excretion on delayed phase imaging. Urinary bladder is physiologically distended without wall thickening. Stomach/Bowel: Similar gastric distension to prior exam without gastric wall thickening. Left lower quadrant ileostomy. Similar stranding in the ostomy fat and mesentery. No small bowel obstruction or inflammation. Mild small bowel wall thickening involving left-sided small bowel in the region of colonic  inflammation, presumably reactive. Decreasing foci of extraluminal gas adjacent to the sigmoid colon, image 67 series 2, in the region of colonic reanastomosis. There is persistent colonic wall thickening of the splenic flexure, descending and proximal sigmoid colon with surrounding pericolonic inflammatory change. Colonic diverticulosis without diverticulitis. Vascular/Lymphatic: No acute vascular findings. Mesenteric vessels are patent. No adenopathy. Reproductive: Prostate is unremarkable. Other: Midline abdominal wound with decreased skin thickening inflammatory change. Small focus of air in the midline wound and minimal fluid likely postsurgical. No evidence of developing abscess. Stranding of the anterior abdomen is decreasing and likely resolving postsurgical change. No intra-abdominal abscess or significant ascites. 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 Diffuse gluteal muscle fatty atrophy. Ileus psoas fatty atrophy. There are no acute or suspicious osseous abnormalities. IMPRESSION: 1. Recent colostomy takedown with creation of left lower quadrant ileostomy. There is persistent colonic wall thickening and pericolonic edema of the splenic flexure, descending and sigmoid colon, suggesting colitis of infectious or inflammatory etiologies. Small foci of extraluminal air at sigmoid colonic reanastomosis sites have diminished from prior exam, favoring resolving postsurgical change. Postoperative leak is not entirely excluded but considered less likely given the lack of improvement over the past 4 days. 2. Improving fat stranding of the anterior abdomen and left upper quadrant presumed resolving postsurgical change. 3. Similar small bowel wall thickening in the left abdomen, likely reactive. No obstruction. Similar gastric distention which may be gastroparesis. 4. Cholelithiasis without gallbladder inflammation. Electronically Signed   By: Rubye Oaks M.D.   On:  05/19/2017 23:21    EKG: Orders placed or performed during the hospital encounter of 05/19/17  . ED EKG  . ED EKG  . EKG 12-Lead  . EKG 12-Lead    IMPRESSION AND PLAN:  1.  Acute colitis, will start IV Cipro and Flagyl.  2.  Hypokalemia, secondary to diarrhea.  Will replace electrolytes, per protocol.  3.  Status post recent abdominal surgery.  Continue wound care per wound care team. 4.  HIV, compliant with his medications.  Will continue treatment per infectious disease.  All the records are reviewed and case discussed with ED provider. Management plans discussed with the patient, family and they are in agreement.  CODE STATUS:    Code Status Orders  (From admission, onward)        Start     Ordered   05/20/17 0305  Full code  Continuous     05/20/17 0304    Code Status History    Date Active Date Inactive Code Status Order ID Comments User Context   05/15/2017 2354 05/18/2017 2030 Full Code [id number]  Altamese Dilling, MD Inpatient   11/25/2016 1327 11/26/2016 1930 Full Code [id number]  Altamese Dilling, MD Inpatient   10/06/2016 1953 10/10/2016 1804 Full Code [id number]  Ramonita Lab, MD Inpatient       TOTAL TIME TAKING CARE OF THIS PATIENT: 45 minutes.    Cammy Copa M.D on 05/20/2017 at 5:59 AM  Between 7am to 6pm - Pager - [phone]  After 6pm go to [email or web address] - password [email or web address] [facility]  [facility]  Office  [phone]  CC: Primary care physician; Emogene Morgan, MD

## 2017-05-20 NOTE — Progress Notes (Signed)
MEDICATION RELATED CONSULT NOTE - INITIAL   Pharmacy Consult for electrolyte management Indication: hypokalemia  Allergies  Allergen Reactions  . Penicillins Hives, Itching and Rash    Has patient had a PCN reaction causing immediate rash, facial/tongue/throat swelling, SOB or lightheadedness with hypotension: Yes Has patient had a PCN reaction causing severe rash involving mucus membranes or skin necrosis: No Has patient had a PCN reaction that required hospitalization No Has patient had a PCN reaction occurring within the last 10 years: No If all of the above answers are "NO", then may proceed with Cephalosporin use.   . Erythromycin Base Itching and Rash    Patient Measurements: Height: 5\' 3"  (160 cm) Weight: 175 lb (79.4 kg) IBW/kg (Calculated) : 56.9 Adjusted Body Weight: 79.4 kg  Vital Signs: Temp: 98.2 F (36.8 C) (04/13 2041) Temp Source: Oral (04/13 2041) BP: 140/96 (04/14 0130) Pulse Rate: 107 (04/14 0130) Intake/Output from previous day: No intake/output data recorded. Intake/Output from this shift: No intake/output data recorded.  Labs: Recent Labs    05/17/17 0558 05/19/17 2120  WBC 4.7 9.5  HGB 9.8* 11.1*  HCT 29.9* 33.0*  PLT 743* 742*  CREATININE 0.96 0.73  ALBUMIN  --  3.7  PROT  --  7.6  AST  --  31  ALT  --  22  ALKPHOS  --  71  BILITOT  --  0.2*   Estimated Creatinine Clearance: 117.8 mL/min (by C-G formula based on SCr of 0.73 mg/dL).   Microbiology: Recent Results (from the past 720 hour(s))  Gastrointestinal Panel by PCR , Stool     Status: None   Collection Time: 05/15/17 10:16 PM  Result Value Ref Range Status   Campylobacter species NOT DETECTED NOT DETECTED Final   Plesimonas shigelloides NOT DETECTED NOT DETECTED Final   Salmonella species NOT DETECTED NOT DETECTED Final   Yersinia enterocolitica NOT DETECTED NOT DETECTED Final   Vibrio species NOT DETECTED NOT DETECTED Final   Vibrio cholerae NOT DETECTED NOT DETECTED Final    Enteroaggregative E coli (EAEC) NOT DETECTED NOT DETECTED Final   Enteropathogenic E coli (EPEC) NOT DETECTED NOT DETECTED Final   Enterotoxigenic E coli (ETEC) NOT DETECTED NOT DETECTED Final   Shiga like toxin producing E coli (STEC) NOT DETECTED NOT DETECTED Final   Shigella/Enteroinvasive E coli (EIEC) NOT DETECTED NOT DETECTED Final   Cryptosporidium NOT DETECTED NOT DETECTED Final   Cyclospora cayetanensis NOT DETECTED NOT DETECTED Final   Entamoeba histolytica NOT DETECTED NOT DETECTED Final   Giardia lamblia NOT DETECTED NOT DETECTED Final   Adenovirus F40/41 NOT DETECTED NOT DETECTED Final   Astrovirus NOT DETECTED NOT DETECTED Final   Norovirus GI/GII NOT DETECTED NOT DETECTED Final   Rotavirus A NOT DETECTED NOT DETECTED Final   Sapovirus (I, II, IV, and V) NOT DETECTED NOT DETECTED Final    Comment: Performed at [facility], [address] [place], [place] [address]  C difficile quick scan w PCR reflex     Status: None   Collection Time: 05/15/17 10:16 PM  Result Value Ref Range Status   C Diff antigen NEGATIVE NEGATIVE Final   C Diff toxin NEGATIVE NEGATIVE Final   C Diff interpretation No C. difficile detected.  Final    Comment: Performed at [facility], [address] [place], [place] [address]  C difficile quick scan w PCR reflex     Status: None   Collection Time: 05/19/17  9:20 PM  Result Value Ref Range  Status   C Diff antigen NEGATIVE NEGATIVE Final   C Diff toxin NEGATIVE NEGATIVE Final   C Diff interpretation No C. difficile detected.  Final    Comment: Performed at [facility], [address] [place], [place] [address]  Gastrointestinal Panel by PCR , Stool     Status: None   Collection Time: 05/19/17  9:20 PM  Result Value Ref Range Status   Campylobacter species NOT DETECTED NOT DETECTED Final   Plesimonas shigelloides NOT DETECTED NOT DETECTED Final   Salmonella species NOT DETECTED NOT DETECTED Final   Yersinia  enterocolitica NOT DETECTED NOT DETECTED Final   Vibrio species NOT DETECTED NOT DETECTED Final   Vibrio cholerae NOT DETECTED NOT DETECTED Final   Enteroaggregative E coli (EAEC) NOT DETECTED NOT DETECTED Final   Enteropathogenic E coli (EPEC) NOT DETECTED NOT DETECTED Final   Enterotoxigenic E coli (ETEC) NOT DETECTED NOT DETECTED Final   Shiga like toxin producing E coli (STEC) NOT DETECTED NOT DETECTED Final   Shigella/Enteroinvasive E coli (EIEC) NOT DETECTED NOT DETECTED Final   Cryptosporidium NOT DETECTED NOT DETECTED Final   Cyclospora cayetanensis NOT DETECTED NOT DETECTED Final   Entamoeba histolytica NOT DETECTED NOT DETECTED Final   Giardia lamblia NOT DETECTED NOT DETECTED Final   Adenovirus F40/41 NOT DETECTED NOT DETECTED Final   Astrovirus NOT DETECTED NOT DETECTED Final   Norovirus GI/GII NOT DETECTED NOT DETECTED Final   Rotavirus A NOT DETECTED NOT DETECTED Final   Sapovirus (I, II, IV, and V) NOT DETECTED NOT DETECTED Final    Comment: Performed at [facility], [address] [place], [place] [address]    Medical History: Past Medical History:  Diagnosis Date  . Abrasion or friction burn of foot and toe(s), without mention of infection   . Acid reflux   . Adopted   . Allergic rhinitis, cause unspecified   . Anxiety   . Arthritis   . Arthrogryposis   . Asthma   . Bladder wall thickening   . BPH (benign prostatic hyperplasia)   . Chronic pain syndrome   . Depressive disorder, not elsewhere classified   . Diabetes mellitus without complication (HCC)   . Diverticulitis of colon (without mention of hemorrhage)(562.11)   . ED (erectile dysfunction)   . Herpes genitalis   . History of echocardiogram    a. 10/2016: EF 50-55%, normal wall motion  . History of stress test    a. 06/2011: significant GI uptake artifact, no evidence of ischemia, EF 56%  . HIV infection (HCC)   . HTN (hypertension)   . Hyperlipidemia   . Hypogonadism in male   . Myalgia  and myositis, unspecified   . OAB (overactive bladder)   . Obesity   . Other psoriasis   . Premature baby    born premature  . Seizure disorder (HCC)   . Sleep apnea   . 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 or other cause, without mention of thyrotoxic crisis or storm    hyperthyroidism  . TIA (transient ischemic attack)   . Type II or unspecified type diabetes mellitus with neurological manifestations, not stated as uncontrolled(250.60)     Medications:  Scheduled:    Assessment: Patient discharged few days ago w/ cc of abdominal pain w/ colonic thickening s/p ileostomy. Patient now admitted again for diarrhea. Pharmacy consulted for electrolyte management K 3.1 Na 133 Bicarb 24 WNL  Goal of Therapy:  K+ 3.5 - 4.5 Na 135 - 145  Plan:  Will start patient on 0.9% sodium chloride w/ 40 mEq of KCI to run @ 75 ml/hr. This will provide 3 mEq of K per hour and over 24 hours patient will have received 72 mEq of K. This will theoretical increase K to 3.8. Will monitor electrolytes w/ am labs.  Thomasene Ripple, PharmD, BCPS Clinical Pharmacist 05/20/2017

## 2017-05-20 NOTE — ED Notes (Signed)
Pts iliostomy bag opened from the bottom opening and began spilling gon pt again. Pt reports he had not touched the bag. Bag closed and sheets changed. Pt cleaned.

## 2017-05-20 NOTE — Progress Notes (Signed)
   05/20/17 1405  Clinical Encounter Type  Visited With Patient  Visit Type Initial (order request for prayer)  Referral From Nurse  Consult/Referral To Chaplain  Spiritual Encounters  Spiritual Needs Prayer   Chaplain responded to order for prayer with patient.  Patient appeared tired and spoke of pain today.  Patient would like prayer for protection for self and family.  Chaplain prayed with patient and then left to allow him to rest.  Encouraged him to have staff page chaplain as needed.

## 2017-05-20 NOTE — Progress Notes (Signed)
1.  Acute colitis, will start IV Cipro and Flagyl.  2.  Hypokalemia, secondary to diarrhea.  Will replace electrolytes, per protocol.  3.  Status post recent abdominal surgery.  Continue wound care per wound care team. 4.  HIV, compliant with his medications.  Will continue treatment per infectious disease.   Agree with above

## 2017-05-21 LAB — GLUCOSE, CAPILLARY
GLUCOSE-CAPILLARY: 165 mg/dL — AB (ref 65–99)
GLUCOSE-CAPILLARY: 157 mg/dL — AB (ref 65–99)
Glucose-Capillary: 196 mg/dL — ABNORMAL HIGH (ref 65–99)
Glucose-Capillary: 296 mg/dL — ABNORMAL HIGH (ref 65–99)

## 2017-05-21 LAB — LACTIC ACID, PLASMA
LACTIC ACID, VENOUS: 2.5 mmol/L — AB (ref 0.5–1.9)
LACTIC ACID, VENOUS: 2 mmol/L — AB (ref 0.5–1.9)

## 2017-05-21 LAB — BASIC METABOLIC PANEL
ANION GAP: 6 (ref 5–15)
BUN: 10 mg/dL (ref 6–20)
CHLORIDE: 108 mmol/L (ref 101–111)
CO2: 22 mmol/L (ref 22–32)
CREATININE: 0.71 mg/dL (ref 0.61–1.24)
Calcium: 8.2 mg/dL — ABNORMAL LOW (ref 8.9–10.3)
GFR calc non Af Amer: >60 mL/min (ref >60)
Glucose, Bld: 228 mg/dL — ABNORMAL HIGH (ref 65–99)
POTASSIUM: 4.4 mmol/L (ref 3.5–5.1)
Sodium: 136 mmol/L (ref 135–145)

## 2017-05-21 LAB — MAGNESIUM: Magnesium: 1.6 mg/dL — ABNORMAL LOW (ref 1.7–2.4)

## 2017-05-21 MED ORDER — INSULIN ASPART 100 UNIT/ML ~~LOC~~ SOLN
6.0000 [IU] | Freq: Three times a day (TID) | SUBCUTANEOUS | Status: DC
  Administered 2017-05-21 – 2017-05-22 (×4): 6 [IU] via SUBCUTANEOUS
  Filled 2017-05-21 (×5): qty 1

## 2017-05-21 MED ORDER — MAGNESIUM OXIDE 400 (241.3 MG) MG PO TABS
400.0000 mg | ORAL_TABLET | Freq: Every day | ORAL | Status: DC
  Administered 2017-05-21 – 2017-05-22 (×2): 400 mg via ORAL
  Filled 2017-05-21 (×2): qty 1

## 2017-05-21 MED ORDER — MAGNESIUM SULFATE 2 GM/50ML IV SOLN
2.0000 g | Freq: Once | INTRAVENOUS | Status: AC
  Administered 2017-05-21: 2 g via INTRAVENOUS
  Filled 2017-05-21: qty 50

## 2017-05-21 MED ORDER — OXYCODONE HCL 5 MG PO TABS
10.0000 mg | ORAL_TABLET | Freq: Four times a day (QID) | ORAL | Status: DC | PRN
  Administered 2017-05-21 – 2017-05-22 (×2): 15 mg via ORAL
  Filled 2017-05-21 (×2): qty 3

## 2017-05-21 MED ORDER — AMITRIPTYLINE HCL 50 MG PO TABS
50.0000 mg | ORAL_TABLET | Freq: Every day | ORAL | Status: DC
  Administered 2017-05-21: 50 mg via ORAL
  Filled 2017-05-21 (×2): qty 1

## 2017-05-21 MED ORDER — GABAPENTIN 300 MG PO CAPS
300.0000 mg | ORAL_CAPSULE | Freq: Three times a day (TID) | ORAL | Status: DC
  Administered 2017-05-21 – 2017-05-22 (×4): 300 mg via ORAL
  Filled 2017-05-21 (×4): qty 1

## 2017-05-21 MED ORDER — METRONIDAZOLE 500 MG PO TABS
500.0000 mg | ORAL_TABLET | Freq: Three times a day (TID) | ORAL | Status: DC
  Administered 2017-05-21 – 2017-05-22 (×4): 500 mg via ORAL
  Filled 2017-05-21 (×5): qty 1

## 2017-05-21 MED ORDER — SODIUM CHLORIDE 0.9 % IV BOLUS
1000.0000 mL | Freq: Once | INTRAVENOUS | Status: AC
  Administered 2017-05-21: 1000 mL via INTRAVENOUS

## 2017-05-21 MED ORDER — GUAIFENESIN 100 MG/5ML PO SOLN
5.0000 mL | ORAL | Status: DC | PRN
  Filled 2017-05-21: qty 5

## 2017-05-21 NOTE — Progress Notes (Signed)
[facility] [place] [facility]   PATIENT NAME: Cory Campbell    MR#:  [id number]  DATE OF BIRTH:  1980/09/22  SUBJECTIVE:  CHIEF COMPLAINT:   Chief Complaint  Patient presents with  . Diarrhea  Complains of diffuse pain only, no events overnight per nursing staff, replete magnesium, lactic acid level improving  REVIEW OF SYSTEMS:  CONSTITUTIONAL: No fever, fatigue or weakness.  EYES: No blurred or double vision.  EARS, NOSE, AND THROAT: No tinnitus or ear pain.  RESPIRATORY: No cough, shortness of breath, wheezing or hemoptysis.  CARDIOVASCULAR: No chest pain, orthopnea, edema.  GASTROINTESTINAL: No nausea, vomiting, diarrhea or abdominal pain.  GENITOURINARY: No dysuria, hematuria.  ENDOCRINE: No polyuria, nocturia,  HEMATOLOGY: No anemia, easy bruising or bleeding SKIN: No rash or lesion. MUSCULOSKELETAL: No joint pain or arthritis.   NEUROLOGIC: No tingling, numbness, weakness.  PSYCHIATRY: No anxiety or depression.   ROS  DRUG ALLERGIES:   Allergies  Allergen Reactions  . Penicillins Hives, Itching and Rash    Has patient had a PCN reaction causing immediate rash, facial/tongue/throat swelling, SOB or lightheadedness with hypotension: Yes Has patient had a PCN reaction causing severe rash involving mucus membranes or skin necrosis: No Has patient had a PCN reaction that required hospitalization No Has patient had a PCN reaction occurring within the last 10 years: No If all of the above answers are "NO", then may proceed with Cephalosporin use.   . Erythromycin Base Itching and Rash    VITALS:  Blood pressure (!) 144/105, pulse 85, temperature 97.7 F (36.5 C), temperature source Oral, resp. rate 16, height 5\' 3"  (1.6 m), weight 79.3 kg (174 lb 12.8 oz), SpO2 100 %.  PHYSICAL EXAMINATION:  GENERAL:  37 y.o.-year-old patient lying in the bed with no acute distress.  EYES: Pupils equal, round, reactive to light and accommodation. No scleral  icterus. Extraocular muscles intact.  HEENT: Head atraumatic, normocephalic. Oropharynx and nasopharynx clear.  NECK:  Supple, no jugular venous distention. No thyroid enlargement, no tenderness.  LUNGS: Normal breath sounds bilaterally, no wheezing, rales,rhonchi or crepitation. No use of accessory muscles of respiration.  CARDIOVASCULAR: S1, S2 normal. No murmurs, rubs, or gallops.  ABDOMEN: Soft, nontender, nondistended. Bowel sounds present. No organomegaly or mass.  EXTREMITIES: No pedal edema, cyanosis, or clubbing.  NEUROLOGIC: Cranial nerves II through XII are intact. Muscle strength 5/5 in all extremities. Sensation intact. Gait not checked.  PSYCHIATRIC: The patient is alert and oriented x 3.  SKIN: No obvious rash, lesion, or ulcer.   Physical Exam LABORATORY PANEL:   CBC Recent Labs  Lab 05/20/17 0415  WBC 9.0  HGB 10.9*  HCT 32.3*  PLT 730*   ------------------------------------------------------------------------------------------------------------------  Chemistries  Recent Labs  Lab 05/19/17 2120  05/21/17 0531  NA 133*   < > 136  K 3.1*   < > 4.4  CL 99*   < > 108  CO2 24   < > 22  GLUCOSE 190*   < > 228*  BUN 15   < > 10  CREATININE 0.73   < > 0.71  CALCIUM 9.3   < > 8.2*  MG  --   --  1.6*  AST 31  --   --   ALT 22  --   --   ALKPHOS 71  --   --   BILITOT 0.2*  --   --    < > = values in this interval not displayed.   ------------------------------------------------------------------------------------------------------------------  Cardiac Enzymes Recent Labs  Lab 05/16/17 0034  TROPONINI <0.03   ------------------------------------------------------------------------------------------------------------------  RADIOLOGY:  Ct Abdomen Pelvis W Contrast  Result Date: 05/19/2017 CLINICAL DATA:  Abdominal infection. Diarrhea. Recent colostomy takedown and ileostomy creation. EXAM: CT ABDOMEN AND PELVIS WITH CONTRAST TECHNIQUE: Multidetector CT  imaging of the abdomen and pelvis was performed using the standard protocol following bolus administration of intravenous contrast. CONTRAST:  100mL OMNIPAQUE IOHEXOL 300 MG/ML  SOLN COMPARISON:  CT 05/15/2017 FINDINGS: Lower chest: Minimal subpleural atelectasis in the left lower lobe. No pleural fluid or consolidation. Hepatobiliary: No focal hepatic lesion. Gallstones without gallbladder inflammation or biliary dilatation. Pancreas: No ductal dilatation or inflammation. Spleen: Normal in size without focal abnormality. Adrenals/Urinary Tract: No adrenal nodule. No hydronephrosis or perinephric edema. Homogeneous renal enhancement with symmetric excretion on delayed phase imaging. Urinary bladder is physiologically distended without wall thickening. Stomach/Bowel: Similar gastric distension to prior exam without gastric wall thickening. Left lower quadrant ileostomy. Similar stranding in the ostomy fat and mesentery. No small bowel obstruction or inflammation. Mild small bowel wall thickening involving left-sided small bowel in the region of colonic inflammation, presumably reactive. Decreasing foci of extraluminal gas adjacent to the sigmoid colon, image 67 series 2, in the region of colonic reanastomosis. There is persistent colonic wall thickening of the splenic flexure, descending and proximal sigmoid colon with surrounding pericolonic inflammatory change. Colonic diverticulosis without diverticulitis. Vascular/Lymphatic: No acute vascular findings. Mesenteric vessels are patent. No adenopathy. Reproductive: Prostate is unremarkable. Other: Midline abdominal wound with decreased skin thickening inflammatory change. Small focus of air in the midline wound and minimal fluid likely postsurgical. No evidence of developing abscess. Stranding of the anterior abdomen is decreasing and likely resolving postsurgical change. No intra-abdominal abscess or significant ascites. 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 Diffuse gluteal muscle fatty atrophy. Ileus psoas fatty atrophy. There are no acute or suspicious osseous abnormalities. IMPRESSION: 1. Recent colostomy takedown with creation of left lower quadrant ileostomy. There is persistent colonic wall thickening and pericolonic edema of the splenic flexure, descending and sigmoid colon, suggesting colitis of infectious or inflammatory etiologies. Small foci of extraluminal air at sigmoid colonic reanastomosis sites have diminished from prior exam, favoring resolving postsurgical change. Postoperative leak is not entirely excluded but considered less likely given the lack of improvement over the past 4 days. 2. Improving fat stranding of the anterior abdomen and left upper quadrant presumed resolving postsurgical change. 3. Similar small bowel wall thickening in the left abdomen, likely reactive. No obstruction. Similar gastric distention which may be gastroparesis. 4. Cholelithiasis without gallbladder inflammation. Electronically Signed   By: Rubye OaksMelanie  Ehinger M.D.   On: 05/19/2017 23:21    ASSESSMENT AND PLAN:  1 acute colitis Resolving Continue empiric Cipro/Flagyl, follow-up on cultures, antiemetics PRN  2 acute hypokalemia Repleted  3 acute hypomagnesemia Replete with p.o. Magnesium  4 acute lactic acidosis Secondary to above IV fluids for rehydration  5 S/Precent colostomy takedown with ileostomy placement  [facility] for continued medical management status post discharge  6 chronicHIV infection Stable Continue antiretroviral agents  Disposition to home in care of family in 1-2 days barring any complications   all the records are reviewed and case discussed with Care Management/Social Workerr. Management plans discussed with the patient, family and they are in agreement.  CODE STATUS: full  TOTAL TIME TAKING CARE OF THIS PATIENT: 35 minutes.      POSSIBLE D/C IN 1-2 DAYS,  DEPENDING ON CLINICAL CONDITION.   Evelena Asa Salary M.D on 05/21/2017   Between 7am to 6pm - Pager - [phone]  After 6pm go to [email or web address] - Social research officer, government  [facility]  Office  [phone]  CC: Primary care physician; Emogene Morgan, MD  Note: This dictation was prepared with Dragon dictation along with smaller phrase technology. Any transcriptional errors that result from this process are unintentional.

## 2017-05-21 NOTE — Progress Notes (Signed)
Dr. Katheren ShamsSalary notified of elevated lactic acid of 2.0 no new orders given at 1248.

## 2017-05-21 NOTE — Progress Notes (Signed)
MEDICATION RELATED CONSULT NOTE  Pharmacy Consult for electrolyte management Indication: hypokalemia  Allergies  Allergen Reactions  . Penicillins Hives, Itching and Rash    Has patient had a PCN reaction causing immediate rash, facial/tongue/throat swelling, SOB or lightheadedness with hypotension: Yes Has patient had a PCN reaction causing severe rash involving mucus membranes or skin necrosis: No Has patient had a PCN reaction that required hospitalization No Has patient had a PCN reaction occurring within the last 10 years: No If all of the above answers are "NO", then may proceed with Cephalosporin use.   . Erythromycin Base Itching and Rash    Patient Measurements: Height: 5\' 3"  (160 cm) Weight: 174 lb 12.8 oz (79.3 kg) IBW/kg (Calculated) : 56.9 Adjusted Body Weight: 79.4 kg  Vital Signs: Temp: 97.7 F (36.5 C) (04/15 0350) Temp Source: Oral (04/15 0350) BP: 143/103 (04/15 0351) Pulse Rate: 81 (04/15 0351) Intake/Output from previous day: 04/14 0701 - 04/15 0700 In: 4013.6 [P.O.:477; I.V.:1986.6; IV Piggyback:1550] Out: 3075 [Urine:2325; Stool:750] Intake/Output from this shift: No intake/output data recorded.  Labs: Recent Labs    05/19/17 2120 05/20/17 0415 05/21/17 0531  WBC 9.5 9.0  --   HGB 11.1* 10.9*  --   HCT 33.0* 32.3*  --   PLT 742* 730*  --   CREATININE 0.73 0.74 0.71  MG  --   --  1.6*  ALBUMIN 3.7  --   --   PROT 7.6  --   --   AST 31  --   --   ALT 22  --   --   ALKPHOS 71  --   --   BILITOT 0.2*  --   --    Estimated Creatinine Clearance: 117.8 mL/min (by C-G formula based on SCr of 0.71 mg/dL).   Microbiology: Recent Results (from the past 720 hour(s))  Gastrointestinal Panel by PCR , Stool     Status: None   Collection Time: 05/15/17 10:16 PM  Result Value Ref Range Status   Campylobacter species NOT DETECTED NOT DETECTED Final   Plesimonas shigelloides NOT DETECTED NOT DETECTED Final   Salmonella species NOT DETECTED NOT  DETECTED Final   Yersinia enterocolitica NOT DETECTED NOT DETECTED Final   Vibrio species NOT DETECTED NOT DETECTED Final   Vibrio cholerae NOT DETECTED NOT DETECTED Final   Enteroaggregative E coli (EAEC) NOT DETECTED NOT DETECTED Final   Enteropathogenic E coli (EPEC) NOT DETECTED NOT DETECTED Final   Enterotoxigenic E coli (ETEC) NOT DETECTED NOT DETECTED Final   Shiga like toxin producing E coli (STEC) NOT DETECTED NOT DETECTED Final   Shigella/Enteroinvasive E coli (EIEC) NOT DETECTED NOT DETECTED Final   Cryptosporidium NOT DETECTED NOT DETECTED Final   Cyclospora cayetanensis NOT DETECTED NOT DETECTED Final   Entamoeba histolytica NOT DETECTED NOT DETECTED Final   Giardia lamblia NOT DETECTED NOT DETECTED Final   Adenovirus F40/41 NOT DETECTED NOT DETECTED Final   Astrovirus NOT DETECTED NOT DETECTED Final   Norovirus GI/GII NOT DETECTED NOT DETECTED Final   Rotavirus A NOT DETECTED NOT DETECTED Final   Sapovirus (I, II, IV, and V) NOT DETECTED NOT DETECTED Final    Comment: Performed at [facility], [address] [place], [place] [address]  C difficile quick scan w PCR reflex     Status: None   Collection Time: 05/15/17 10:16 PM  Result Value Ref Range Status   C Diff antigen NEGATIVE NEGATIVE Final   C Diff toxin NEGATIVE NEGATIVE Final  C Diff interpretation No C. difficile detected.  Final    Comment: Performed at [facility], [address] [place], [place] [address]  C difficile quick scan w PCR reflex     Status: None   Collection Time: 05/19/17  9:20 PM  Result Value Ref Range Status   C Diff antigen NEGATIVE NEGATIVE Final   C Diff toxin NEGATIVE NEGATIVE Final   C Diff interpretation No C. difficile detected.  Final    Comment: Performed at [facility], [address] [place], [place] [address]  Gastrointestinal Panel by PCR , Stool     Status: None   Collection Time: 05/19/17  9:20 PM  Result Value Ref Range Status    Campylobacter species NOT DETECTED NOT DETECTED Final   Plesimonas shigelloides NOT DETECTED NOT DETECTED Final   Salmonella species NOT DETECTED NOT DETECTED Final   Yersinia enterocolitica NOT DETECTED NOT DETECTED Final   Vibrio species NOT DETECTED NOT DETECTED Final   Vibrio cholerae NOT DETECTED NOT DETECTED Final   Enteroaggregative E coli (EAEC) NOT DETECTED NOT DETECTED Final   Enteropathogenic E coli (EPEC) NOT DETECTED NOT DETECTED Final   Enterotoxigenic E coli (ETEC) NOT DETECTED NOT DETECTED Final   Shiga like toxin producing E coli (STEC) NOT DETECTED NOT DETECTED Final   Shigella/Enteroinvasive E coli (EIEC) NOT DETECTED NOT DETECTED Final   Cryptosporidium NOT DETECTED NOT DETECTED Final   Cyclospora cayetanensis NOT DETECTED NOT DETECTED Final   Entamoeba histolytica NOT DETECTED NOT DETECTED Final   Giardia lamblia NOT DETECTED NOT DETECTED Final   Adenovirus F40/41 NOT DETECTED NOT DETECTED Final   Astrovirus NOT DETECTED NOT DETECTED Final   Norovirus GI/GII NOT DETECTED NOT DETECTED Final   Rotavirus A NOT DETECTED NOT DETECTED Final   Sapovirus (I, II, IV, and V) NOT DETECTED NOT DETECTED Final    Comment: Performed at [facility], [address] [place], [place] [address]    Medical History: Past Medical History:  Diagnosis Date  . Abrasion or friction burn of foot and toe(s), without mention of infection   . Acid reflux   . Adopted   . Allergic rhinitis, cause unspecified   . Anxiety   . Arthritis   . Arthrogryposis   . Asthma   . Bladder wall thickening   . BPH (benign prostatic hyperplasia)   . Chronic pain syndrome   . Depressive disorder, not elsewhere classified   . Diabetes mellitus without complication (HCC)   . Diverticulitis of colon (without mention of hemorrhage)(562.11)   . ED (erectile dysfunction)   . Herpes genitalis   . History of echocardiogram    a. 10/2016: EF 50-55%, normal wall motion  . History of stress test    a.  06/2011: significant GI uptake artifact, no evidence of ischemia, EF 56%  . HIV infection (HCC)   . HTN (hypertension)   . Hyperlipidemia   . Hypogonadism in male   . Myalgia and myositis, unspecified   . OAB (overactive bladder)   . Obesity   . Other psoriasis   . Premature baby    born premature  . Seizure disorder (HCC)   . Sleep apnea   . 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 or other cause, without mention of thyrotoxic crisis or storm    hyperthyroidism  . TIA (transient ischemic attack)   . Type II or unspecified type diabetes mellitus with neurological manifestations, not stated as uncontrolled(250.60)     Medications:  Scheduled:  .  acyclovir  400 mg Oral BID  . alfuzosin  10 mg Oral Daily  . aspirin EC  81 mg Oral Daily  . bictegravir-emtricitabine-tenofovir AF  1 tablet Oral Daily  . budesonide (PULMICORT) nebulizer solution  0.5 mg Nebulization BID  . Buprenorphine  15 mg Transdermal Weekly  . buPROPion  150 mg Oral Daily  . citalopram  20 mg Oral Daily  . darifenacin  7.5 mg Oral Daily  . dicyclomine  20 mg Oral Q6H  . docusate sodium  100 mg Oral BID  . famotidine  20 mg Oral BID  . feeding supplement (GLUCERNA SHAKE)  237 mL Oral QID  . finasteride  5 mg Oral Daily  . gabapentin  300 mg Oral QHS  . heparin  5,000 Units Subcutaneous Q8H  . insulin aspart  0-5 Units Subcutaneous QHS  . insulin aspart  0-9 Units Subcutaneous TID WC  . insulin glargine  60 Units Subcutaneous QHS  . loratadine  10 mg Oral Daily  . metoprolol tartrate  150 mg Oral BID  . mirtazapine  45 mg Oral QHS  . multivitamin with minerals  1 tablet Oral Daily  . nicotine  14 mg Transdermal Daily  . nortriptyline  50 mg Oral QHS  . pantoprazole  40 mg Oral Daily  . prednisoLONE acetate  1 drop Right Eye Daily    Assessment: Patient discharged few days ago w/ cc of abdominal pain w/ colonic thickening s/p ileostomy. Patient now admitted again for diarrhea. Pharmacy consulted for  electrolyte management K 3.1 Na 133 Bicarb 24 WNL  Goal of Therapy:  K+ 3.5 - 4.5 Na 135 - 145  Plan:  K=4.4, Mag 1.6 Patient on IVF - NS w/ KCL 71meq/L @ 100 ml/hr Will order Magnesium sulfate 2 gram IV x1. Will monitor electrolytes w/ am labs.  Bari Mantis PharmD Clinical Pharmacist 05/21/2017

## 2017-05-21 NOTE — Consult Note (Addendum)
WOC Nurse wound consult note Reason for Consult: Consult requested for abd wound and ileosotmy.  Pt is familiar to WOC team from recent visit last week; he was at home less than 2 days after discharge and was unable to keep stool from leaking into the Vac dressing and his pouch would not adhere to his skin.  Wound type: Midline full thickness post-op wound previously had a Vac dressing prior to admission, but was trapping stool underneath the sponge since the ostomy is leaking constantly.  The Vac should be left off until a seal can be maintained for the ostomy pouch.   Middle abd wound is beefy red; 9X2X.2cm, small amt yellow drainage, no odor. Lower abd scar tissue is beginning to break down and has evolved into a partial thickness wound; 2X.2X.1cm, red and moist, related to constantly leaking stool. Apply moist gauze to abd and tape until leaking ostomy is resolved.  Pt's vac machine is in the room.    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  Skin is red and macerated to 5 inches surrounding stoma and pouches will not adhere related to moisture from effluent draining onto the skin.  Pt has large amt pain when the location is touched or cleansed.  Medicated for pain and attempted to protect skin with crusting technique.  Applied small Eakin pouch and suction catheter to medium wall suction in an attempt to promote drying and healing of the skin.  Dressing procedure/placement/frequency: Supplies at the bedside and instructions provided for staff nurses to follow: Bedside nurse: please change pouch PRN if leaking.  (Use small Eakin pouch [phone]#[id number])  Apply as follows: Clean skin around stoma with water, then apply antifungal powder, then wipe with skin prep pads to protect the skin and eliminate excess powder.  Cut opening in the pouch, using pattern left in the room.  Apply over the stoma and attach to a bedside drainage  bag.  Cut a small hole near the top of the pouch and insert a suction catheter. (Do not throw this away, it can be re-used each time.) Tape the suction catheter in place and use medium wall suction.   Reviewed plan of care with patient.   Pt needs to remain in the hospital at least day or two so suction can be provided to his stoma and promote healing; the skin is so denuded no pouch will stay in place. Cammie Mcgeeawn Nafis Farnan MSN, RN, CWOCN, JacksonWCN-AP, CNS [phone]

## 2017-05-21 NOTE — Care Management (Signed)
Open with [facility] for [facility]. Barbara CowerJason with [facility] aware of admission. Patient came in with a [employer] wound vac in place.

## 2017-05-21 NOTE — Progress Notes (Signed)
Inpatient Diabetes Program Recommendations  AACE/ADA: New Consensus Statement on Inpatient Glycemic Control (2015)  Target Ranges:  Prepandial:   less than 140 mg/dL      Peak postprandial:   less than 180 mg/dL (1-2 hours)      Critically ill patients:  140 - 180 mg/dL   Lab Results  Component Value Date   GLUCAP 196 (H) 05/21/2017   HGBA1C 7.0 (H) 10/07/2016    Review of Glycemic ControlResults for Cory Campbell, [name] (MRN [id number]) as of 05/21/2017 11:06  Ref. Range 05/20/2017 07:45 05/20/2017 11:47 05/20/2017 16:32 05/20/2017 21:29  Glucose-Capillary Latest Ref Range: 65 - 99 mg/dL 409231 (H) 811307 (H) 914246 (H) 223 (H)    Diabetes history: Type 2 DM Outpatient Diabetes medications: Tresiba 80 units q HS, Novolog 12 units tid with meals, Metformin 750 mg bid Current orders for Inpatient glycemic control:  Lantus 60 units q HS, Novolog sensitive tid with meals and HS Inpatient Diabetes Program Recommendations:   May consider adding Novolog 6 units tid with meals (hold if patient eats less than 50%). Text page sent.  Thanks,  Beryl MeagerJenny Gaspard Isbell, RN, BC-ADM Inpatient Diabetes Coordinator Pager [phone] (8a-5p)

## 2017-05-21 NOTE — Progress Notes (Signed)
Chaplain prayed a prayer of protection for the patient and his family. Patient is worried about external influences and the judgments of others, rather than the immediate concern of his health. Chaplain tried to refocus the patient's energy on wellness and wholeness.

## 2017-05-22 DIAGNOSIS — K529 Noninfective gastroenteritis and colitis, unspecified: Principal | ICD-10-CM

## 2017-05-22 LAB — BASIC METABOLIC PANEL
Anion gap: 5 (ref 5–15)
BUN: 10 mg/dL (ref 6–20)
CHLORIDE: 103 mmol/L (ref 101–111)
CO2: 26 mmol/L (ref 22–32)
CREATININE: 0.65 mg/dL (ref 0.61–1.24)
Calcium: 8.3 mg/dL — ABNORMAL LOW (ref 8.9–10.3)
GFR calc non Af Amer: >60 mL/min (ref >60)
Glucose, Bld: 128 mg/dL — ABNORMAL HIGH (ref 65–99)
Potassium: 4 mmol/L (ref 3.5–5.1)
SODIUM: 134 mmol/L — AB (ref 135–145)

## 2017-05-22 LAB — GLUCOSE, CAPILLARY
GLUCOSE-CAPILLARY: 127 mg/dL — AB (ref 65–99)
Glucose-Capillary: 178 mg/dL — ABNORMAL HIGH (ref 65–99)

## 2017-05-22 LAB — MAGNESIUM: Magnesium: 1.7 mg/dL (ref 1.7–2.4)

## 2017-05-22 MED ORDER — AMLODIPINE BESYLATE 5 MG PO TABS
5.0000 mg | ORAL_TABLET | Freq: Once | ORAL | Status: AC
  Administered 2017-05-22: 5 mg via ORAL
  Filled 2017-05-22: qty 1

## 2017-05-22 MED ORDER — MAGNESIUM OXIDE 400 (241.3 MG) MG PO TABS: 400.0000 mg | ORAL_TABLET | Freq: Every day | ORAL | 0 refills | Status: DC

## 2017-05-22 MED ORDER — CIPROFLOXACIN HCL 500 MG PO TABS: 500.0000 mg | ORAL_TABLET | Freq: Two times a day (BID) | ORAL | 0 refills | Status: AC

## 2017-05-22 MED ORDER — METRONIDAZOLE 500 MG PO TABS: 500.0000 mg | ORAL_TABLET | Freq: Three times a day (TID) | ORAL | 0 refills | Status: DC

## 2017-05-22 NOTE — Consult Note (Addendum)
WOC Nurse ostomy follow up Stoma type/location:  Eakin pouch applied yesterday lasted 18 hours without leaking when the suction was left on.  Pt had eaten corn last night and a large amount of the kernels clogged the suction catheter and the drainage spout and the pouch over-filled and leaked.  Bedside nurse changed at 06:45 and did not reapply suction, since it had not been functioning.  Current pouch is beginning to lift away from skin at the lower edges, where skin remains severely macerated.  Applied new Eakin pouch and suction catheter to medium wall suction to attempt to dry and heal skin, which remains red and moist; 5X5cm area with weeping clear fluid; do not believe the pouch will be able to maintain a pouch seal at home until the skin is dry and healed.    Supplies left at the bedside and instructions have been provided for bedside nurses to perform PRN if leaking occurs.    Vac machine has been discontinued from abd wound and it is at the bedside if patient is discharged; this should remain off until the pouch can maintain a seal since it was suctioning stool up underneath the Vac sponge.  Upper abd wound is beefy red, maceration has created new area of partilal thickness skin loss to lower abd, refer to previous note. Reviewed steps for pouching with patient and discussed troubleshooting if clogs occur to avoid over-filling of the pouch.  Pt will need home health assistance after discharge for a difficult pouching situation. Cammie Mcgeeawn Jaquann Guarisco MSN, RN, CWOCN, AzureWCN-AP, CNS [phone]

## 2017-05-22 NOTE — Progress Notes (Signed)
Wound pouch leaking on distal edge; area cleansed, patted dry; barrier ring warmed, cut and stretched to fit around distal edge of stoma; skin reddened, raw and tender to touch; barrier ring applied to distal edge with tincture of benzoin after skin dried as best that could be; wound pouch applied over ring and around stoma, warmed with palm of hand; patient tolerated well; new drainage bag applied to port on lower end of wound pouch; suction discontinued, had clogged up and ceased to function; stoma template placed with other wound pouches at bedside; Windy Carinaurner,Zamirah Denny K, RN 6:48 AM 05/22/2017

## 2017-05-22 NOTE — Consult Note (Signed)
SURGICAL CONSULTATION NOTE (initial) - cpt: [id number]  HISTORY OF PRESENT ILLNESS (HPI):  37 y.o. HIV+ male well-controlled on antiviral medications s/p recent [facility] with creation of diverting loop ileostomy and placement of wound VAC for vertical midline wound presented [facility] ED 3 days ago for abdominal pain and high-volume ileostomy drainage. Patient reports he feels better now, denies significant abdominal pain, N/V, fever/chills, CP, or SOB. He describes that he was scheduled to be discharged today, but his discharge is on-hold due to concern expressed by his RN regarding drainage from his wound since his wound VAC was removed due to inability to maintain a seal and trapping of ileostomy fluid in his nearby wound. Review of EMR and confirmed by patient is difficulty with maintaining seal around ileostomy due to close proximity of wound.  Surgery is consulted by medical physician Dr. Katheren ShamsSalary in this context for evaluation and management of post-surgical midline wound drainage.  PAST MEDICAL HISTORY (PMH):  Past Medical History:  Diagnosis Date  . Abrasion or friction burn of foot and toe(s), without mention of infection   . Acid reflux   . Adopted   . Allergic rhinitis, cause unspecified   . Anxiety   . Arthritis   . Arthrogryposis   . Asthma   . Bladder wall thickening   . BPH (benign prostatic hyperplasia)   . Chronic pain syndrome   . Depressive disorder, not elsewhere classified   . Diabetes mellitus without complication (HCC)   . Diverticulitis of colon (without mention of hemorrhage)(562.11)   . ED (erectile dysfunction)   . Herpes genitalis   . History of echocardiogram    a. 10/2016: EF 50-55%, normal wall motion  . History of stress test    a. 06/2011: significant GI uptake artifact, no evidence of ischemia, EF 56%  . HIV infection (HCC)   . HTN (hypertension)   . Hyperlipidemia   . Hypogonadism in male   . Myalgia and myositis, unspecified   . OAB  (overactive bladder)   . Obesity   . Other psoriasis   . Premature baby    born premature  . Seizure disorder (HCC)   . Sleep apnea   . 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 or other cause, without mention of thyrotoxic crisis or storm    hyperthyroidism  . TIA (transient ischemic attack)   . Type II or unspecified type diabetes mellitus with neurological manifestations, not stated as uncontrolled(250.60)      PAST SURGICAL HISTORY (PSH):  Past Surgical History:  Procedure Laterality Date  . APPENDECTOMY    . COLOSTOMY    . CORNEAL TRANSPLANT    . feet surgery     both  . HAND SURGERY     left and right  . leg surgery     left and right  . ORTHOPEDIC SURGERY     hands, feet, knees, legs  . tubes in ears     both     MEDICATIONS:  Prior to Admission medications   Medication Sig Start Date End Date Taking? Authorizing Provider  acyclovir (ZOVIRAX) 400 MG tablet Take 400 mg by mouth 2 (two) times daily.   Yes [provider]  albuterol (PROVENTIL HFA;VENTOLIN HFA) 108 (90 BASE) MCG/ACT inhaler Inhale 2 puffs into the lungs every 4 (four) hours as needed for wheezing or shortness of breath.    Yes [provider]  albuterol (PROVENTIL) (2.5 MG/3ML) 0.083% nebulizer solution Take 2.5 mg by nebulization every 4 (  four) hours as needed for wheezing or shortness of breath.   Yes [provider]  alfuzosin (UROXATRAL) 10 MG 24 hr tablet Take 10 mg by mouth daily.   Yes [provider]  aspirin 81 MG tablet Take 81 mg by mouth daily.   Yes [provider]  BIKTARVY 50-200-25 MG TABS tablet Take 1 tablet by mouth daily. 11/21/16  Yes [provider]  Buprenorphine (BUTRANS) 15 MCG/HR PTWK Place 15 mg onto the skin once a week. Saturday   Yes [provider]  buPROPion (WELLBUTRIN XL) 150 MG 24 hr tablet Take 1 tablet by mouth daily. 11/16/16  Yes [provider]  citalopram (CELEXA) 20 MG tablet Take 20 mg by  mouth daily.   Yes [provider]  diclofenac sodium (VOLTAREN) 1 % GEL Apply 2-4 g topically 4 (four) times daily as needed. For pain.   Yes [provider]  dicyclomine (BENTYL) 20 MG tablet Take 20 mg by mouth every 6 (six) hours.   Yes [provider]  famotidine (PEPCID) 20 MG tablet Take 20 mg by mouth daily.  01/03/16  Yes [provider]  feeding supplement, GLUCERNA SHAKE, (GLUCERNA SHAKE) LIQD Take 237 mLs by mouth 4 (four) times daily.   Yes [provider]  finasteride (PROSCAR) 5 MG tablet Take 5 mg by mouth daily.   Yes [provider]  fluticasone (FLOVENT HFA) 110 MCG/ACT inhaler Inhale 2 puffs into the lungs 2 (two) times daily.   Yes [provider]  gabapentin (NEURONTIN) 300 MG capsule Take 300 mg by mouth at bedtime.   Yes [provider]  hydrocortisone 2.5 % cream Apply 1 application topically 2 (two) times daily as needed for itching.    Yes [provider]  insulin degludec (TRESIBA FLEXTOUCH) 100 UNIT/ML SOPN FlexTouch Pen Inject 80 Units into the skin at bedtime.    Yes [provider]  ketoconazole (NIZORAL) 2 % shampoo Apply 1 application topically 2 (two) times a week. tues and thursday 08/27/14  Yes [provider]  loratadine (CLARITIN) 10 MG tablet Take 10 mg by mouth daily.   Yes [provider]  Melatonin 5 MG TABS Take 10 mg by mouth at bedtime as needed (sleep).   Yes [provider]  metFORMIN (GLUCOPHAGE-XR) 750 MG 24 hr tablet Take 750 mg by mouth 2 (two) times daily.   Yes [provider]  metoprolol tartrate (LOPRESSOR) 100 MG tablet Take 150 mg by mouth 2 (two) times daily.    Yes [provider]  miconazole (MICOTIN) 2 % cream Apply 1 application topically 2 (two) times daily.   Yes [provider]  mirtazapine (REMERON) 45 MG tablet Take 45 mg by mouth at bedtime.   Yes [provider]  Multiple Vitamin  (MULTIVITAMIN) tablet Take 1 tablet by mouth daily.   Yes [provider]  naloxone [facility]) nasal spray 4 mg/0.1 mL One spray in either nostril once for known/suspected opioid overdose. May repeat every 2-3 minutes in alternating nostril til EMS arrives 01/12/16  Yes [provider]  nicotine (NICODERM CQ - DOSED IN MG/24 HOURS) 14 mg/24hr patch Place 14 mg onto the skin daily.   Yes [provider]  nicotine polacrilex (COMMIT) 4 MG lozenge Take 4 mg by mouth every 2 (two) hours as needed for smoking cessation.   Yes [provider]  nitroGLYCERIN (NITROSTAT) 0.4 MG SL tablet DISSOLVE 1 TABLET UNDER THE TONGUE EVERY 5 MINUTES AS  NEEDED FOR CHEST PAIN. CALL 911 IF CHEST IS NOT RESOLVED WITH 3 TABLETS 05/17/17  Yes Gollan, Tollie Pizza, MD  nortriptyline (PAMELOR) 25 MG capsule Take 50 mg by mouth at bedtime.   Yes [provider]  NOVOLOG FLEXPEN 100 UNIT/ML FlexPen Inject 12 Units into the skin 3 (three) times daily with meals. With every meal and every snack 12/13/15  Yes [provider]  nystatin-triamcinolone (MYCOLOG II) cream Apply 1 application topically 2 (two) times daily.   Yes [provider]  omega-3 acid ethyl esters (LOVAZA) 1 g capsule Take 1 g by mouth daily.   Yes [provider]  omeprazole (PRILOSEC) 20 MG capsule Take 20 mg by mouth daily.   Yes [provider]  ondansetron (ZOFRAN-ODT) 4 MG disintegrating tablet Take 4 mg by mouth every 8 (eight) hours as needed for nausea or vomiting.   Yes [provider]  Oxycodone HCl 10 MG TABS Take 10-15 mg by mouth every 4 (four) hours as needed (pain).   Yes [provider]  potassium chloride SA (K-DUR,KLOR-CON) 20 MEQ tablet take 1 tablet by mouth once daily if needed Patient taking differently: take 1 tablet by mouth once daily 02/08/16  Yes Gollan, Tollie Pizza, MD  prednisoLONE acetate (PRED FORTE) 1 % ophthalmic suspension Place 1 drop into the  right eye daily.    Yes [provider]  solifenacin (VESICARE) 10 MG tablet Take 10 mg by mouth daily.   Yes [provider]  tiZANidine (ZANAFLEX) 4 MG capsule Take 4 mg by mouth 3 (three) times daily as needed for muscle spasms.   Yes [provider]  ciprofloxacin (CIPRO) 500 MG tablet Take 1 tablet (500 mg total) by mouth 2 (two) times daily for 10 days. 05/22/17 06/01/17  Salary, Jetty Duhamel D, MD  magnesium oxide (MAG-OX) 400 (241.3 Mg) MG tablet Take 1 tablet (400 mg total) by mouth daily. 05/23/17   Salary, Evelena Asa, MD  metroNIDAZOLE (FLAGYL) 500 MG tablet Take 1 tablet (500 mg total) by mouth every 8 (eight) hours. 05/22/17   Salary, Evelena Asa, MD     ALLERGIES:  Allergies  Allergen Reactions  . Penicillins Hives, Itching and Rash    Has patient had a PCN reaction causing immediate rash, facial/tongue/throat swelling, SOB or lightheadedness with hypotension: Yes Has patient had a PCN reaction causing severe rash involving mucus membranes or skin necrosis: No Has patient had a PCN reaction that required hospitalization No Has patient had a PCN reaction occurring within the last 10 years: No If all of the above answers are "NO", then may proceed with Cephalosporin use.   . Erythromycin Base Itching and Rash     SOCIAL HISTORY:  Social History   Socioeconomic History  . Marital status: Married    Spouse name: Not on file  . Number of children: Not on file  . Years of education: Not on file  . Highest education level: Not on file  Occupational History  . Not on file  Social Needs  . Financial resource strain: Not on file  . Food insecurity:    Worry: Not on file    Inability: Not on file  . Transportation needs:    Medical: Not on file    Non-medical: Not on file  Tobacco Use  . Smoking status: Current Every Day Smoker    Years: 0.00    Types: Pipe  . Smokeless tobacco: Never Used  . Tobacco comment: quit smoking friday 08/18/2016  Substance  and Sexual Activity  . Alcohol use: No    Alcohol/week: 0.0 oz    Comment: used to be moderate  . Drug use: No  . Sexual activity: Not on file  Lifestyle  . Physical activity:    Days per week: Not on file    Minutes per session: Not on file  . Stress: Not on file  Relationships  . Social connections:    Talks on phone: Not on file    Gets together: Not on file    Attends religious service: Not on file    Active member of club or organization: Not on file    Attends meetings of clubs or organizations: Not on file    Relationship status: Not on file  . Intimate partner violence:    Fear of current or ex partner: Not on file    Emotionally abused: Not on file    Physically abused: Not on file    Forced sexual activity: Not on file  Other Topics Concern  . Not on file  Social History Narrative   Uses crutches to ambulate    The patient currently resides (home / rehab facility / nursing home): Home The patient normally is (ambulatory / bedbound): Ambulatory   FAMILY HISTORY:  Family History  Family history unknown: Yes     REVIEW OF SYSTEMS:  Constitutional: denies weight loss, fever, chills, or sweats  Eyes: denies any other vision changes, history of eye injury  ENT: denies sore throat, hearing problems  Respiratory: denies shortness of breath, wheezing  Cardiovascular: denies chest pain, palpitations  Gastrointestinal: abdominal pain, N/V, and bowel function as per HPI Genitourinary: denies burning with urination or urinary frequency Musculoskeletal: denies any other joint pains or cramps  Skin: denies any other rashes or skin discolorations  Neurological: denies any other headache, dizziness, weakness  Psychiatric: denies any other depression, anxiety   All other review of systems were negative   VITAL SIGNS:  Temp:  [97.9 F (36.6 C)-98.3 F (36.8 C)] 98.3 F (36.8 C) (04/16 1259) Pulse Rate:  [66-89] 86 (04/16 1259) Resp:  [17-20] 20 (04/16 0542) BP:  (107-153)/(81-112) 145/103 (04/16 1259) SpO2:  [97 %-100 %] 99 % (04/16 1259) Weight:  [177 lb 14.6 oz (80.7 kg)] 177 lb 14.6 oz (80.7 kg) (04/16 0500)     Height: 5\' 3"  (160 cm) Weight: 177 lb 14.6 oz (80.7 kg) BMI (Calculated): 31.52   INTAKE/OUTPUT:  This shift: Total I/O In: 1255 [P.O.:560; I.V.:695] Out: 250 [Urine:250]  Last 2 shifts: @IOLAST2SHIFTS @   PHYSICAL EXAM:  Constitutional:  -- Normal body habitus  -- Awake, alert, and oriented x3, no apparent distress Eyes:  -- Pupils equally round and reactive to light  -- No scleral icterus, B/L no occular discharge Ear, nose, throat: -- Neck is FROM WNL -- No jugular venous distension  Pulmonary:  -- No wheezes or rhales -- Equal breath sounds bilaterally -- Breathing non-labored at rest Cardiovascular:  -- S1, S2 present  -- No pericardial rubs  Gastrointestinal:  -- Abdomen soft, nontender, non-distended, no guarding or rebound tenderness -- Vertical midline wound with a pink healthy granulating bed without evidence of any drainage at this time, though the nearby ileostomy appliance seal appears challenging, at least partially attributable to proximity of adjacent wound -- No abdominal masses appreciated, pulsatile or otherwise  Musculoskeletal and Integumentary:  -- Wounds or skin discoloration: As described in detail above (see GI) -- Extremities: B/L UE and LE FROM, hands and  feet warm, no edema  Neurologic:  -- Motor function: Intact and symmetric -- Sensation: Intact and symmetric Psychiatric:  -- Mood and affect WNL  Labs:  CBC Latest Ref Rng & Units 05/20/2017 05/19/2017 05/17/2017  WBC 3.8 - 10.6 K/uL 9.0 9.5 4.7  Hemoglobin 13.0 - 18.0 g/dL 10.9(L) 11.1(L) 9.8(L)  Hematocrit 40.0 - 52.0 % 32.3(L) 33.0(L) 29.9(L)  Platelets 150 - 440 K/uL 730(H) 742(H) 743(H)   CMP Latest Ref Rng & Units 05/22/2017 05/21/2017 05/20/2017  Glucose 65 - 99 mg/dL 161(W) 960(A) 540(J)  BUN 6 - 20 mg/dL 10 10 12   Creatinine 0.61 -  1.24 mg/dL 8.11 9.14 7.82  Sodium 135 - 145 mmol/L 134(L) 136 133(L)  Potassium 3.5 - 5.1 mmol/L 4.0 4.4 3.2(L)  Chloride 101 - 111 mmol/L 103 108 97(L)  CO2 22 - 32 mmol/L 26 22 26   Calcium 8.9 - 10.3 mg/dL 8.3(L) 8.2(L) 9.2  Total Protein 6.5 - 8.1 g/dL - - -  Total Bilirubin 0.3 - 1.2 mg/dL - - -  Alkaline Phos 38 - 126 U/L - - -  AST 15 - 41 U/L - - -  ALT 17 - 63 U/L - - -   Assessment/Plan: (ICD-10's: T81.89XS) 37 y.o. male with vertical midline wound with a pink healthy granulating bed without evidence of any drainage at this time, though the nearby ileostomy appliance seal appears challenging, at least partially attributable to proximity of adjacent woundcomplicated by pertinent comorbidities including HIV+, obesity (BMI 32), DM, HTN, HLD, history of TIA, asthma, OSA, hyperthyroidism, GERD, BPH, generalized anxiety disorder, and major depression disorder.   - currently no evidence to suggest wound infection or EC fistula  - ostomy care as per wound / ostomy team, medical management as per primary team  - no obvious reason to prevent discharge home from a surgical perspective at this time  - outpatient surgical follow-up with patient's surgeon at [facility] (scheduled for today) rescheduled for next week  - will signoff, please call if any questions or concerns  All of the above findings and recommendations were discussed with the patient and his medical physician, and all of patient's questions were answered to his expressed satisfaction.  Thank you for the opportunity to participate in this patient's care.   -- Scherrie Gerlach Earlene Plater, MD, RPVI [place]: [facility] General [facility] - Partnering for exceptional care. Office: [phone]

## 2017-05-22 NOTE — Progress Notes (Signed)
Hospitalist paged for elevated BP; need PRN orders with parameters. Awaiting callback. Windy Carinaurner,Efton Thomley K, RN 5:57 AM 05/22/2017

## 2017-05-22 NOTE — Care Management Note (Signed)
Case Management Note  Patient Details  Name: Sondra ComeCharles J Kail MRN: [id number] Date of Birth: 31-Mar-1980   Barbara CowerJason with [facility] notified of discharge.  EMS transport form on chart.      Subjective/Objective:                    Action/Plan:   Expected Discharge Date:  05/22/17               Expected Discharge Plan:  Home w Home Health Services  In-House Referral:     Discharge planning Services  CM Consult  Post Acute Care Choice:  Home Health, Resumption of Svcs/PTA Provider Choice offered to:     DME Arranged:    DME Agency:     HH Arranged:  RN HH Agency:  [facility]  Status of Service:  Completed, signed off  If discussed at Long Length of Stay Meetings, dates discussed:    Additional Comments:  Chapman FitchBOWEN, Mayla Biddy T, RN 05/22/2017, 4:12 PM

## 2017-05-22 NOTE — Progress Notes (Signed)
[facility] [place] [facility]   PATIENT NAME: Cory Campbell    MR#:  [id number]  DATE OF BIRTH:  04/27/1980  SUBJECTIVE:  CHIEF COMPLAINT:   Chief Complaint  Patient presents with  . Diarrhea  Abdominal wound drainage continues, wound VAC taken off by wound nurse, general surgery to see  REVIEW OF SYSTEMS:  CONSTITUTIONAL: No fever, fatigue or weakness.  EYES: No blurred or double vision.  EARS, NOSE, AND THROAT: No tinnitus or ear pain.  RESPIRATORY: No cough, shortness of breath, wheezing or hemoptysis.  CARDIOVASCULAR: No chest pain, orthopnea, edema.  GASTROINTESTINAL: No nausea, vomiting, diarrhea or abdominal pain.  GENITOURINARY: No dysuria, hematuria.  ENDOCRINE: No polyuria, nocturia,  HEMATOLOGY: No anemia, easy bruising or bleeding SKIN: No rash or lesion. MUSCULOSKELETAL: No joint pain or arthritis.   NEUROLOGIC: No tingling, numbness, weakness.  PSYCHIATRY: No anxiety or depression.   ROS  DRUG ALLERGIES:   Allergies  Allergen Reactions  . Penicillins Hives, Itching and Rash    Has patient had a PCN reaction causing immediate rash, facial/tongue/throat swelling, SOB or lightheadedness with hypotension: Yes Has patient had a PCN reaction causing severe rash involving mucus membranes or skin necrosis: No Has patient had a PCN reaction that required hospitalization No Has patient had a PCN reaction occurring within the last 10 years: No If all of the above answers are "NO", then may proceed with Cephalosporin use.   . Erythromycin Base Itching and Rash    VITALS:  Blood pressure (!) 145/103, pulse 86, temperature 98.3 F (36.8 C), temperature source Oral, resp. rate 20, height 5\' 3"  (1.6 m), weight 80.7 kg (177 lb 14.6 oz), SpO2 99 %.  PHYSICAL EXAMINATION:  GENERAL:  37 y.o.-year-old patient lying in the bed with no acute distress.  EYES: Pupils equal, round, reactive to light and accommodation. No scleral icterus. Extraocular  muscles intact.  HEENT: Head atraumatic, normocephalic. Oropharynx and nasopharynx clear.  NECK:  Supple, no jugular venous distention. No thyroid enlargement, no tenderness.  LUNGS: Normal breath sounds bilaterally, no wheezing, rales,rhonchi or crepitation. No use of accessory muscles of respiration.  CARDIOVASCULAR: S1, S2 normal. No murmurs, rubs, or gallops.  ABDOMEN: Soft, nontender, nondistended. Bowel sounds present. No organomegaly or mass.  EXTREMITIES: No pedal edema, cyanosis, or clubbing.  NEUROLOGIC: Cranial nerves II through XII are intact. Muscle strength 5/5 in all extremities. Sensation intact. Gait not checked.  PSYCHIATRIC: The patient is alert and oriented x 3.  SKIN: No obvious rash, lesion, or ulcer.   Physical Exam LABORATORY PANEL:   CBC Recent Labs  Lab 05/20/17 0415  WBC 9.0  HGB 10.9*  HCT 32.3*  PLT 730*   ------------------------------------------------------------------------------------------------------------------  Chemistries  Recent Labs  Lab 05/19/17 2120  05/22/17 0747  NA 133*   < > 134*  K 3.1*   < > 4.0  CL 99*   < > 103  CO2 24   < > 26  GLUCOSE 190*   < > 128*  BUN 15   < > 10  CREATININE 0.73   < > 0.65  CALCIUM 9.3   < > 8.3*  MG  --    < > 1.7  AST 31  --   --   ALT 22  --   --   ALKPHOS 71  --   --   BILITOT 0.2*  --   --    < > = values in this interval not displayed.   ------------------------------------------------------------------------------------------------------------------  Cardiac Enzymes Recent Labs  Lab 05/16/17 0034  TROPONINI <0.03   ------------------------------------------------------------------------------------------------------------------  RADIOLOGY:  No results found.  ASSESSMENT AND PLAN:  *Acute on chronic abdominal wound drainage  Status post recent surgery at [facility]-recent colostomy takedown with ileostomy placement/wound VAC placement-Missed follow-up appointment that was scheduled  for today at [facility] Wound care nurse consulted-wound VAC removed General surgery consult if expert opinion-hold discharge  * acute colitis Resolving Continue empiric Cipro/Flagyl  *acute hypokalemia Repleted  *acute hypomagnesemia Repleted with p.o. Magnesium  *acute lactic acidosis Resolved with IV fluids Secondary to above  * S/Precent colostomy takedown with ileostomy placement  Stable We will need to follow-up with [facility] general surgery as recommended for continued medical management status post discharge  * chronicHIVinfection Stable Continue antiretroviral agents  All the records are reviewed and case discussed with Care Management/Social Workerr. Management plans discussed with the patient, family and they are in agreement.  CODE STATUS: full  TOTAL TIME TAKING CARE OF THIS PATIENT: 35 minutes.     POSSIBLE D/C IN 1-5 DAYS, DEPENDING ON CLINICAL CONDITION.   Evelena Asa Salary M.D on 05/22/2017   Between 7am to 6pm - Pager - [phone]  After 6pm go to [email or web address] - Social research officer, government  [facility]  Office  [phone]  CC: Primary care physician; Emogene Morgan, MD  Note: This dictation was prepared with Dragon dictation along with smaller phrase technology. Any transcriptional errors that result from this process are unintentional.

## 2017-05-22 NOTE — Progress Notes (Signed)
Initial Nutrition Assessment  DOCUMENTATION CODES:   Not applicable  INTERVENTION:   Recommend Premier Protein BID, each supplement provides 160 kcal and 30 grams of protein.   Recommend Ocuvite daily for wound healing (provides zinc, vitamin A, vitamin C, Vitamin E, copper, and selenium)  NUTRITION DIAGNOSIS:   Increased nutrient needs related to post-op healing, wound healing as evidenced by increased estimated needs from protein.  GOAL:   Patient will meet greater than or equal to 90% of their needs  MONITOR:   PO intake, Supplement acceptance, Labs, Weight trends, Skin, I & O's  REASON FOR ASSESSMENT:   Consult Assessment of nutrition requirement/status  ASSESSMENT:   37 y.o. male with a known history of HIV, diabetes type 2, chronic pain syndrome, hypertension, seizure disorder and other medical comorbidities. Patient was brought to emergency room for severe abdominal pain and diarrhea through his ostomy going on for the past 2-3 days.  No fever/chills no vomiting no bleeding. He is status post recent surgery at [facility] for colostomy takedown with loop ileostomy and wound VAC placement 3/27.   Met with pt in room today. Pt reports good appetite and oral intake pta. Pt currently eating 100% of meals in hospital and drinking Glucerna. Pt with ostomy output of 674m x 24 hrs. Wound VAC discontinued. Per chart, pt appears to have gained some weight since last RD visit in September. Recommend Premier Protein instead of Glucerna as this provides less calories and more protein. Recommend Ocuvite for wound healing.   Medications reviewed and include: aspirin, celexa, colace, pepcid, heparin, insulin, Mg Oxide, metronidazole, remeron, MVI, nicotine, NaCl w/ KCl '@100ml'$ /hr, ciprofloxacin, oxycodone  Labs reviewed: K 4.0 wnl, Na 134(L), Ca 8.3(L), Mg 1.7 wnl cbgs- 186, 228, 128 x 48hrs AIC 7.0(H)- 10/2016  Diet Order:  Diet heart healthy/carb modified Room service appropriate? Yes; Fluid  consistency: Thin Diet - low sodium heart healthy  EDUCATION NEEDS:   Education needs have been addressed  Skin:  Skin Assessment: (incision abdomen, wound around ostomy)  Last BM:  ileostomy- 6071m Height:   Ht Readings from Last 1 Encounters:  05/19/17 '5\' 3"'$  (1.6 m)    Weight:   Wt Readings from Last 1 Encounters:  05/22/17 177 lb 14.6 oz (80.7 kg)    Ideal Body Weight:  56.3 kg  BMI:  Body mass index is 31.52 kg/m.  Estimated Nutritional Needs:   Kcal:  1800-2100kcal/day   Protein:  80-96g/day   Fluid:  >1.7L/day   CaKoleen DistanceS, RD, LDN Pager #- [phone] Hours Pager: [phone]

## 2017-05-22 NOTE — Progress Notes (Signed)
Dr. Kavin LeechS. Prasanna notified of elevated BP, BID Lopressor and no PRN; acknowledged; new order written; will continue to monitor. Windy Carinaurner,Bishoy Cupp K, RN 6:05 AM 05/22/2017

## 2017-05-22 NOTE — Discharge Summary (Signed)
[place] [facility] [place] NAME: Cory Campbell    MR#:  [id number]  DATE OF BIRTH:  1980/10/01  DATE OF ADMISSION:  05/19/2017 ADMITTING PHYSICIAN: Amelia Jo, MD  DATE OF DISCHARGE: No discharge date for patient encounter.  PRIMARY CARE PHYSICIAN: Aycock, Ngwe A, MD    ADMISSION DIAGNOSIS:  Colitis [K52.9] Abdominal pain, unspecified abdominal location [R10.9] Diarrhea, unspecified type [R19.7]  DISCHARGE DIAGNOSIS:  Active Problems:   Acute colitis   SECONDARY DIAGNOSIS:   Past Medical History:  Diagnosis Date  . Abrasion or friction burn of foot and toe(s), without mention of infection   . Acid reflux   . Adopted   . Allergic rhinitis, cause unspecified   . Anxiety   . Arthritis   . Arthrogryposis   . Asthma   . Bladder wall thickening   . BPH (benign prostatic hyperplasia)   . Chronic pain syndrome   . Depressive disorder, not elsewhere classified   . Diabetes mellitus without complication ([place])   . Diverticulitis of colon (without mention of hemorrhage)(562.11)   . ED (erectile dysfunction)   . Herpes genitalis   . History of echocardiogram    a. 10/2016: EF 50-55%, normal wall motion  . History of stress test    a. 06/2011: significant GI uptake artifact, no evidence of ischemia, EF 56%  . HIV infection ([place])   . HTN (hypertension)   . Hyperlipidemia   . Hypogonadism in male   . Myalgia and myositis, unspecified   . OAB (overactive bladder)   . Obesity   . Other psoriasis   . Premature baby    born premature  . Seizure disorder ([place])   . Sleep apnea   . 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 or other cause, without mention of thyrotoxic crisis or storm    hyperthyroidism  . TIA (transient ischemic attack)   . Type II or unspecified type diabetes mellitus with neurological manifestations, not stated as uncontrolled(250.60)     HOSPITAL COURSE:  1 acute colitis Resolving Treated empirically with  Cipro/Flagyl, provided antiemetics PRN, and recommend follow-up with surgery as previously recommended at [facility] for continued medical care  2 acute hypokalemia Repleted  3 acute hypomagnesemia Repleted with p.o. Magnesium  4 acute lactic acidosis Resolved with IV fluids Secondary to above  5 S/Precent colostomy takedown with ileostomy placement  Stable Advised to follow-[facility] with [facility] surgery as recommended for continued medical management status post discharge  6 chronicHIV infection Stable Continued antiretroviral agents  DISCHARGE CONDITIONS:  On the day of discharge patient is afebrile, hemogram stable, tolerating diet, back to his baseline, ready for discharge to home in the care of family with services, follow-up as directed, for more specific details please see chart  CONSULTS OBTAINED:    DRUG ALLERGIES:   Allergies  Allergen Reactions  . Penicillins Hives, Itching and Rash    Has patient had a PCN reaction causing immediate rash, facial/tongue/throat swelling, SOB or lightheadedness with hypotension: Yes Has patient had a PCN reaction causing severe rash involving mucus membranes or skin necrosis: No Has patient had a PCN reaction that required hospitalization No Has patient had a PCN reaction occurring within the last 10 years: No If all of the above answers are "NO", then may proceed with Cephalosporin use.   . Erythromycin Base Itching and Rash    DISCHARGE MEDICATIONS:   Allergies as of 05/22/2017      Reactions   Penicillins Hives, Itching,  Rash   Has patient had a PCN reaction causing immediate rash, facial/tongue/throat swelling, SOB or lightheadedness with hypotension: Yes Has patient had a PCN reaction causing severe rash involving mucus membranes or skin necrosis: No Has patient had a PCN reaction that required hospitalization No Has patient had a PCN reaction occurring within the last 10 years: No If all of the above answers are "NO",  then may proceed with Cephalosporin use.   Erythromycin Base Itching, Rash      Medication List    TAKE these medications   acyclovir 400 MG tablet Commonly known as:  ZOVIRAX Take 400 mg by mouth 2 (two) times daily.   albuterol (2.5 MG/3ML) 0.083% nebulizer solution Commonly known as:  PROVENTIL Take 2.5 mg by nebulization every 4 (four) hours as needed for wheezing or shortness of breath.   albuterol 108 (90 Base) MCG/ACT inhaler Commonly known as:  PROVENTIL HFA;VENTOLIN HFA Inhale 2 puffs into the lungs every 4 (four) hours as needed for wheezing or shortness of breath.   alfuzosin 10 MG 24 hr tablet Commonly known as:  UROXATRAL Take 10 mg by mouth daily.   aspirin 81 MG tablet Take 81 mg by mouth daily.   BIKTARVY 50-200-25 MG Tabs tablet Generic drug:  bictegravir-emtricitabine-tenofovir AF Take 1 tablet by mouth daily.   buPROPion 150 MG 24 hr tablet Commonly known as:  WELLBUTRIN XL Take 1 tablet by mouth daily.   BUTRANS 15 MCG/HR Ptwk Generic drug:  Buprenorphine Place 15 mg onto the skin once a week. Saturday   ciprofloxacin 500 MG tablet Commonly known as:  CIPRO Take 1 tablet (500 mg total) by mouth 2 (two) times daily for 10 days.   citalopram 20 MG tablet Commonly known as:  CELEXA Take 20 mg by mouth daily.   diclofenac sodium 1 % Gel Commonly known as:  VOLTAREN Apply 2-4 g topically 4 (four) times daily as needed. For pain.   dicyclomine 20 MG tablet Commonly known as:  BENTYL Take 20 mg by mouth every 6 (six) hours.   famotidine 20 MG tablet Commonly known as:  PEPCID Take 20 mg by mouth daily.   feeding supplement (GLUCERNA SHAKE) Liqd Take 237 mLs by mouth 4 (four) times daily.   finasteride 5 MG tablet Commonly known as:  PROSCAR Take 5 mg by mouth daily.   fluticasone 110 MCG/ACT inhaler Commonly known as:  FLOVENT HFA Inhale 2 puffs into the lungs 2 (two) times daily.   gabapentin 300 MG capsule Commonly known as:   NEURONTIN Take 300 mg by mouth at bedtime.   hydrocortisone 2.5 % cream Apply 1 application topically 2 (two) times daily as needed for itching.   ketoconazole 2 % shampoo Commonly known as:  NIZORAL Apply 1 application topically 2 (two) times a week. tues and thursday   loratadine 10 MG tablet Commonly known as:  CLARITIN Take 10 mg by mouth daily.   magnesium oxide 400 (241.3 Mg) MG tablet Commonly known as:  MAG-OX Take 1 tablet (400 mg total) by mouth daily. Start taking on:  05/23/2017   Melatonin 5 MG Tabs Take 10 mg by mouth at bedtime as needed (sleep).   metFORMIN 750 MG 24 hr tablet Commonly known as:  GLUCOPHAGE-XR Take 750 mg by mouth 2 (two) times daily.   metoprolol tartrate 100 MG tablet Commonly known as:  LOPRESSOR Take 150 mg by mouth 2 (two) times daily.   metroNIDAZOLE 500 MG tablet Commonly known as:  FLAGYL Take  1 tablet (500 mg total) by mouth every 8 (eight) hours.   miconazole 2 % cream Commonly known as:  MICOTIN Apply 1 application topically 2 (two) times daily.   mirtazapine 45 MG tablet Commonly known as:  REMERON Take 45 mg by mouth at bedtime.   multivitamin tablet Take 1 tablet by mouth daily.   naloxone 4 MG/0.1ML Liqd nasal spray kit Commonly known as:  NARCAN One spray in either nostril once for known/suspected opioid overdose. May repeat every 2-3 minutes in alternating nostril til EMS arrives   nicotine 14 mg/24hr patch Commonly known as:  NICODERM CQ - dosed in mg/24 hours Place 14 mg onto the skin daily.   nicotine polacrilex 4 MG lozenge Commonly known as:  COMMIT Take 4 mg by mouth every 2 (two) hours as needed for smoking cessation.   nitroGLYCERIN 0.4 MG SL tablet Commonly known as:  NITROSTAT DISSOLVE 1 TABLET UNDER THE TONGUE EVERY 5 MINUTES AS NEEDED FOR CHEST PAIN. CALL 911 IF CHEST IS NOT RESOLVED WITH 3 TABLETS   nortriptyline 25 MG capsule Commonly known as:  PAMELOR Take 50 mg by mouth at bedtime.    NOVOLOG FLEXPEN 100 UNIT/ML FlexPen Generic drug:  insulin aspart Inject 12 Units into the skin 3 (three) times daily with meals. With every meal and every snack   nystatin-triamcinolone cream Commonly known as:  MYCOLOG II Apply 1 application topically 2 (two) times daily.   omega-3 acid ethyl esters 1 g capsule Commonly known as:  LOVAZA Take 1 g by mouth daily.   omeprazole 20 MG capsule Commonly known as:  PRILOSEC Take 20 mg by mouth daily.   ondansetron 4 MG disintegrating tablet Commonly known as:  ZOFRAN-ODT Take 4 mg by mouth every 8 (eight) hours as needed for nausea or vomiting.   Oxycodone HCl 10 MG Tabs Take 10-15 mg by mouth every 4 (four) hours as needed (pain).   potassium chloride SA 20 MEQ tablet Commonly known as:  K-DUR,KLOR-CON take 1 tablet by mouth once daily if needed What changed:  See the new instructions.   prednisoLONE acetate 1 % ophthalmic suspension Commonly known as:  PRED FORTE Place 1 drop into the right eye daily.   solifenacin 10 MG tablet Commonly known as:  VESICARE Take 10 mg by mouth daily.   tiZANidine 4 MG capsule Commonly known as:  ZANAFLEX Take 4 mg by mouth 3 (three) times daily as needed for muscle spasms.   TRESIBA FLEXTOUCH 100 UNIT/ML Sopn FlexTouch Pen Generic drug:  insulin degludec Inject 80 Units into the skin at bedtime.        DISCHARGE INSTRUCTIONS:   If you experience worsening of your admission symptoms, develop shortness of breath, life threatening emergency, suicidal or homicidal thoughts you must seek medical attention immediately by calling 911 or calling your MD immediately  if symptoms less severe.  You Must read complete instructions/literature along with all the possible adverse reactions/side effects for all the Medicines you take and that have been prescribed to you. Take any new Medicines after you have completely understood and accept all the possible adverse reactions/side effects.   Please  note  You were cared for by a hospitalist during your hospital stay. If you have any questions about your discharge medications or the care you received while you were in the hospital after you are discharged, you can call the unit and asked to speak with the hospitalist on call if the hospitalist that took care of you  is not available. Once you are discharged, your primary care physician will handle any further medical issues. Please note that NO REFILLS for any discharge medications will be authorized once you are discharged, as it is imperative that you return to your primary care physician (or establish a relationship with a primary care physician if you do not have one) for your aftercare needs so that they can reassess your need for medications and monitor your lab values.    Today   CHIEF COMPLAINT:   Chief Complaint  Patient presents with  . Diarrhea    HISTORY OF PRESENT ILLNESS:  37 y.o. male with a known history of HIV, diabetes type 2, chronic pain syndrome, hypertension, seizure disorder and other medical comorbidities.  Patient was brought to emergency room for severe abdominal pain and diarrhea through his ostomy going on for the past 2-3 days.  No fever/chills no vomiting no bleeding.  He is status post recent surgery at [facility] for colostomy takedown with loop ileostomy and wound VAC placement.  Stool test done in emergency room are negative for C. difficile and other GI infections, however the abdominal CAT scan shows acute colitis.  Blood test done emergency room are remarkable for hypokalemia at 3.1. Patient is admitted for further evaluation and treatment.  VITAL SIGNS:  Blood pressure (!) 148/108, pulse 83, temperature 97.9 F (36.6 C), temperature source Oral, resp. rate 20, height '5\' 3"'$  (1.6 m), weight 80.7 kg (177 lb 14.6 oz), SpO2 100 %.  I/O:    Intake/Output Summary (Last 24 hours) at 05/22/2017 1125 Last data filed at 05/22/2017 0806 Gross per 24 hour  Intake  3720 ml  Output 5000 ml  Net -1280 ml    PHYSICAL EXAMINATION:  GENERAL:  37 y.o.-year-old patient lying in the bed with no acute distress.  EYES: Pupils equal, round, reactive to light and accommodation. No scleral icterus. Extraocular muscles intact.  HEENT: Head atraumatic, normocephalic. Oropharynx and nasopharynx clear.  NECK:  Supple, no jugular venous distention. No thyroid enlargement, no tenderness.  LUNGS: Normal breath sounds bilaterally, no wheezing, rales,rhonchi or crepitation. No use of accessory muscles of respiration.  CARDIOVASCULAR: S1, S2 normal. No murmurs, rubs, or gallops.  ABDOMEN: Soft, non-tender, non-distended. Bowel sounds present. No organomegaly or mass.  EXTREMITIES: No pedal edema, cyanosis, or clubbing.  NEUROLOGIC: Cranial nerves II through XII are intact. Muscle strength 5/5 in all extremities. Sensation intact. Gait not checked.  PSYCHIATRIC: The patient is alert and oriented x 3.  SKIN: No obvious rash, lesion, or ulcer.   DATA REVIEW:   CBC Recent Labs  Lab 05/20/17 0415  WBC 9.0  HGB 10.9*  HCT 32.3*  PLT 730*    Chemistries  Recent Labs  Lab 05/19/17 2120  05/22/17 0747  NA 133*   < > 134*  K 3.1*   < > 4.0  CL 99*   < > 103  CO2 24   < > 26  GLUCOSE 190*   < > 128*  BUN 15   < > 10  CREATININE 0.73   < > 0.65  CALCIUM 9.3   < > 8.3*  MG  --    < > 1.7  AST 31  --   --   ALT 22  --   --   ALKPHOS 71  --   --   BILITOT 0.2*  --   --    < > = values in this interval not displayed.    Cardiac Enzymes  Recent Labs  Lab 05/16/17 0034  TROPONINI <0.03    Microbiology Results  Results for orders placed or performed during the hospital encounter of 05/19/17  C difficile quick scan w PCR reflex     Status: None   Collection Time: 05/19/17  9:20 PM  Result Value Ref Range Status   C Diff antigen NEGATIVE NEGATIVE Final   C Diff toxin NEGATIVE NEGATIVE Final   C Diff interpretation No C. difficile detected.  Final     Comment: Performed at [facility], [address] [place], [place] [address]  Gastrointestinal Panel by PCR , Stool     Status: None   Collection Time: 05/19/17  9:20 PM  Result Value Ref Range Status   Campylobacter species NOT DETECTED NOT DETECTED Final   Plesimonas shigelloides NOT DETECTED NOT DETECTED Final   Salmonella species NOT DETECTED NOT DETECTED Final   Yersinia enterocolitica NOT DETECTED NOT DETECTED Final   Vibrio species NOT DETECTED NOT DETECTED Final   Vibrio cholerae NOT DETECTED NOT DETECTED Final   Enteroaggregative E coli (EAEC) NOT DETECTED NOT DETECTED Final   Enteropathogenic E coli (EPEC) NOT DETECTED NOT DETECTED Final   Enterotoxigenic E coli (ETEC) NOT DETECTED NOT DETECTED Final   Shiga like toxin producing E coli (STEC) NOT DETECTED NOT DETECTED Final   Shigella/Enteroinvasive E coli (EIEC) NOT DETECTED NOT DETECTED Final   Cryptosporidium NOT DETECTED NOT DETECTED Final   Cyclospora cayetanensis NOT DETECTED NOT DETECTED Final   Entamoeba histolytica NOT DETECTED NOT DETECTED Final   Giardia lamblia NOT DETECTED NOT DETECTED Final   Adenovirus F40/41 NOT DETECTED NOT DETECTED Final   Astrovirus NOT DETECTED NOT DETECTED Final   Norovirus GI/GII NOT DETECTED NOT DETECTED Final   Rotavirus A NOT DETECTED NOT DETECTED Final   Sapovirus (I, II, IV, and V) NOT DETECTED NOT DETECTED Final    Comment: Performed at [facility], [address] [place], [place] [address]    RADIOLOGY:  No results found.  EKG:   Orders placed or performed during the hospital encounter of 05/19/17  . ED EKG  . ED EKG  . EKG 12-Lead  . EKG 12-Lead      Management plans discussed with the patient, family and they are in agreement.  CODE STATUS:     Code Status Orders  (From admission, onward)        Start     Ordered   05/20/17 0305  Full code  Continuous     05/20/17 0304    Code Status History    Date Active Date Inactive Code  Status Order ID Comments User Context   05/15/2017 2354 05/18/2017 2030 Full Code [id number]  Vaughan Basta, MD Inpatient   11/25/2016 1327 11/26/2016 1930 Full Code [id number]  Vaughan Basta, MD Inpatient   10/06/2016 1953 10/10/2016 1804 Full Code [id number]  Nicholes Mango, MD Inpatient      TOTAL TIME TAKING CARE OF THIS PATIENT: 45 minutes.    Avel Peace [place] Yeats M.D on 05/22/2017 at 11:25 AM  Between 7am to 6pm - Pager - [phone]  After 6pm go to [email or web address] - password [email or web address] [facility]  Office  [phone]  CC: Primary care physician; Donnie Coffin, MD   Note: This dictation was prepared with Dragon dictation along with smaller phrase technology. Any transcriptional errors that result from this process are unintentional.

## 2017-05-23 ENCOUNTER — Encounter: Payer: Self-pay | Admitting: Emergency Medicine

## 2017-05-23 ENCOUNTER — Emergency Department
Admission: EM | Admit: 2017-05-23 | Discharge: 2017-05-23 | Disposition: A | Discharge status: Home / Self Care | Payer: Medicaid Other | Attending: Emergency Medicine | Admitting: Emergency Medicine

## 2017-05-23 ENCOUNTER — Other Ambulatory Visit: Payer: Self-pay

## 2017-05-23 DIAGNOSIS — Z9889 Other specified postprocedural states: Secondary | ICD-10-CM | POA: Diagnosis not present

## 2017-05-23 DIAGNOSIS — E119 Type 2 diabetes mellitus without complications: Secondary | ICD-10-CM | POA: Diagnosis not present

## 2017-05-23 DIAGNOSIS — Z5189 Encounter for other specified aftercare: Secondary | ICD-10-CM | POA: Diagnosis not present

## 2017-05-23 DIAGNOSIS — K9403 Colostomy malfunction: Secondary | ICD-10-CM | POA: Insufficient documentation

## 2017-05-23 DIAGNOSIS — Z8673 Personal history of transient ischemic attack (TIA), and cerebral infarction without residual deficits: Secondary | ICD-10-CM | POA: Insufficient documentation

## 2017-05-23 DIAGNOSIS — J45909 Unspecified asthma, uncomplicated: Secondary | ICD-10-CM | POA: Insufficient documentation

## 2017-05-23 DIAGNOSIS — F17290 Nicotine dependence, other tobacco product, uncomplicated: Secondary | ICD-10-CM | POA: Insufficient documentation

## 2017-05-23 DIAGNOSIS — I10 Essential (primary) hypertension: Secondary | ICD-10-CM | POA: Diagnosis not present

## 2017-05-23 DIAGNOSIS — B20 Human immunodeficiency virus [HIV] disease: Secondary | ICD-10-CM | POA: Diagnosis not present

## 2017-05-23 LAB — CBC WITH DIFFERENTIAL/PLATELET
BASOS ABS: 0.1 10*3/uL (ref 0–0.1)
BASOS PCT: 1 %
EOS PCT: 1 %
Eosinophils Absolute: 0.1 10*3/uL (ref 0–0.7)
HCT: 31.8 % — ABNORMAL LOW (ref 40.0–52.0)
Hemoglobin: 11 g/dL — ABNORMAL LOW (ref 13.0–18.0)
LYMPHS PCT: 10 %
Lymphs Abs: 1.1 10*3/uL (ref 1.0–3.6)
MCH: 29.3 pg (ref 26.0–34.0)
MCHC: 34.8 g/dL (ref 32.0–36.0)
MCV: 84.3 fL (ref 80.0–100.0)
Monocytes Absolute: 0.7 10*3/uL (ref 0.2–1.0)
Monocytes Relative: 7 %
Neutro Abs: 8.4 10*3/uL — ABNORMAL HIGH (ref 1.4–6.5)
Neutrophils Relative %: 81 %
PLATELETS: 517 10*3/uL — AB (ref 150–440)
RBC: 3.77 MIL/uL — AB (ref 4.40–5.90)
RDW: 14.8 % — ABNORMAL HIGH (ref 11.5–14.5)
WBC: 10.3 10*3/uL (ref 3.8–10.6)

## 2017-05-23 LAB — COMPREHENSIVE METABOLIC PANEL
ALBUMIN: 3.5 g/dL (ref 3.5–5.0)
ALT: 16 U/L — AB (ref 17–63)
AST: 21 U/L (ref 15–41)
Alkaline Phosphatase: 69 U/L (ref 38–126)
Anion gap: 9 (ref 5–15)
BUN: 10 mg/dL (ref 6–20)
CHLORIDE: 103 mmol/L (ref 101–111)
CO2: 24 mmol/L (ref 22–32)
CREATININE: 0.64 mg/dL (ref 0.61–1.24)
Calcium: 8.8 mg/dL — ABNORMAL LOW (ref 8.9–10.3)
GFR calc Af Amer: >60 mL/min (ref >60)
GFR calc non Af Amer: >60 mL/min (ref >60)
GLUCOSE: 161 mg/dL — AB (ref 65–99)
POTASSIUM: 3.9 mmol/L (ref 3.5–5.1)
SODIUM: 136 mmol/L (ref 135–145)
Total Bilirubin: 0.5 mg/dL (ref 0.3–1.2)
Total Protein: 7 g/dL (ref 6.5–8.1)

## 2017-05-23 MED ORDER — HYDROMORPHONE HCL 1 MG/ML IJ SOLN
0.5000 mg | Freq: Once | INTRAMUSCULAR | Status: AC
  Administered 2017-05-23: 0.5 mg via INTRAVENOUS
  Filled 2017-05-23: qty 1

## 2017-05-23 MED ORDER — SODIUM CHLORIDE 0.9 % IV SOLN
1.0000 g | Freq: Once | INTRAVENOUS | Status: AC
  Administered 2017-05-23: 1 g via INTRAVENOUS
  Filled 2017-05-23: qty 10

## 2017-05-23 NOTE — Consult Note (Signed)
WOC Nurse wound consult note Pt is familiar to WOC from recent admission; pt was discharged less than 24 hours ago. Reason for Consult: Consult requested for abd wound and ileosotmy. He states his pouch would not adhere to his skin.  Wound type: Midline full thickness post-op wound previously had a Vac dressing prior to admission, but was trapping stool underneath the sponge since the ostomy is leaking constantly.  The Vac should be left off until a seal can be maintained for the ostomy pouch.   Middle abd wound is beefy red; 9X2X.2cm, small amt yellow drainage, no odor. Lower abd scar tissue is beginning to break down and has evolved into a partial thickness wound; 2X.2X.1cm, red and moist, related to constantly leaking stool. Apply moist gauze to abd and tape until leaking ostomy is resolved.   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  Skin is red and macerated to 4 inches surrounding stoma and pouches will not adhere related to moisture from effluent draining onto the skin.  Pt has large amt pain when the location is touched or cleansed.  Medicated for pain and attempted to protect skin with crusting technique.  Applied small Eakin pouch to drainage bag.  Dressing procedure/placement/frequency: Instructions provided for staff nurses to follow if patient is admitted: Bedside nurse: please change pouch PRN if leaking.  (Use small Eakin pouch [phone]#[id number])  Apply as follows: Clean skin around stoma with water, then apply antifungal powder, then wipe with skin prep pads to protect the skin and eliminate excess powder.  Cut opening in the pouch, using pattern left in the room.  Apply over the stoma and attach to a bedside drainage bag.   Demonstrated application process and emptying and patient states he has supplies at home and will have home health assistance after discharge.  It will be difficult to maintain a pouch  seal while skin remains moist, weeping, and macerated. Please re-consult if further assistance is needed.  Thank-you,  Cory Mcgeeawn Regis Wiland MSN, RN, CWOCN, [place], CNS [phone]

## 2017-05-23 NOTE — Care Management (Addendum)
Immunocompromised patient representing to hospital ED after discharged to home with home health services yesterday.   Wound VAC had been removed prior to discharge; patient had been clear by surgery yesterday (Incomplete note from 05/22/17 Dr. Earlene Plateravis) [facility] after speaking with me and PA Dr. Judithann SheenSparks. [facility] updated of ED visit.

## 2017-05-23 NOTE — ED Notes (Signed)
Pt called out stating that he was attempting to empty his ostomy bag and the bag came off.

## 2017-05-23 NOTE — ED Triage Notes (Signed)
Pt to ED via [facility] from home for colostomy bag rupture. Pt was admitted 3 days ago and was released yesterday. Pt currently in pain from fecal matter being on his skin. Pt A & O x 4.   VSS per EMS, CBG 180.

## 2017-05-23 NOTE — ED Notes (Signed)
Wound, ostomy nurse at bedside

## 2017-05-23 NOTE — Care Management (Signed)
Late entry:  Case was discussed with physician advisor Dr. Judithann SheenSparks prior to discharge 05/22/17.  Discharge was cancelled.  Attending consulted surgery.  General surgery assessed patient and it was determined that no surgical intervention was indicated acutely, and [facility]  At that time attending reentered discharge order, and patient was transported home via EMS with resumption of home health services through [facility].  Surgery note is pending incomplete.

## 2017-05-23 NOTE — ED Notes (Signed)
Called and left message for Clydie BraunKaren, ostomy nurse to call back

## 2017-05-23 NOTE — ED Notes (Signed)
Pt requesting more pain medication. Dr. Mayford KnifeWilliams aware and will go re-evaluate patient. Pt informed

## 2017-05-23 NOTE — ED Provider Notes (Signed)
[facility] Emergency Department Provider Note       Time seen: ----------------------------------------- 8:52 AM on 05/23/2017 -----------------------------------------   I have reviewed the triage vital signs and the nursing notes.  HISTORY   Chief Complaint Post-op Problem    HPI Cory Campbell is a 37 y.o. male with a history of GERD, arthritis, chronic pain, depression, diabetes, hyperlipidemia, hypertension, TIA who presents to the ED for issues with his colostomy bag.  Patient arrives by EMS from home with colostomy bag rupture.  He was admitted 3 days ago and was released yesterday.  He also has some laparotomy incision site dehiscence which is being packed by home health nurse.  He has been on antibiotics recently for wound infection.  He is complaining of burning skin at the site of his colostomy bag rupture.  Past Medical History:  Diagnosis Date  . Abrasion or friction burn of foot and toe(s), without mention of infection   . Acid reflux   . Adopted   . Allergic rhinitis, cause unspecified   . Anxiety   . Arthritis   . Arthrogryposis   . Asthma   . Bladder wall thickening   . BPH (benign prostatic hyperplasia)   . Chronic pain syndrome   . Depressive disorder, not elsewhere classified   . Diabetes mellitus without complication (HCC)   . Diverticulitis of colon (without mention of hemorrhage)(562.11)   . ED (erectile dysfunction)   . Herpes genitalis   . History of echocardiogram    a. 10/2016: EF 50-55%, normal wall motion  . History of stress test    a. 06/2011: significant GI uptake artifact, no evidence of ischemia, EF 56%  . HIV infection (HCC)   . HTN (hypertension)   . Hyperlipidemia   . Hypogonadism in male   . Myalgia and myositis, unspecified   . OAB (overactive bladder)   . Obesity   . Other psoriasis   . Premature baby    born premature  . Seizure disorder (HCC)   . Sleep apnea   . Thyrotoxicosis without mention of  goiter or other cause, without mention of thyrotoxic crisis or storm    hyperthyroidism  . TIA (transient ischemic attack)   . Type II or unspecified type diabetes mellitus with neurological manifestations, not stated as uncontrolled(250.60)     Patient Active Problem List   Diagnosis Date Noted  . Acute colitis 05/20/2017  . Colitis   . Acute renal failure (HCC) 05/15/2017  . Diarrhea 05/15/2017  . Sepsis (HCC) 05/15/2017  . Abdominal pain 11/25/2016  . Hypotension 10/08/2016  . Unstable angina (HCC) 10/06/2016  . Shortness of breath 06/23/2015  . Obesity 05/18/2014  . Frequent falls 08/12/2013  . Pain of left clavicle 08/12/2013  . Tachycardia 01/21/2013  . Obstructive sleep apnea on CPAP 06/07/2012  . Chest pain 06/20/2011  . Uncontrolled hypertension 06/20/2011  . Diabetes mellitus (HCC) 06/20/2011    Past Surgical History:  Procedure Laterality Date  . APPENDECTOMY    . COLOSTOMY    . CORNEAL TRANSPLANT    . feet surgery     both  . HAND SURGERY     left and right  . leg surgery     left and right  . ORTHOPEDIC SURGERY     hands, feet, knees, legs  . tubes in ears     both    Allergies Penicillins and Erythromycin base  Social History Social History   Tobacco Use  . Smoking status:  Current Every Day Smoker    Years: 0.00    Types: Pipe  . Smokeless tobacco: Never Used  . Tobacco comment: quit smoking friday 08/18/2016  Substance Use Topics  . Alcohol use: No    Alcohol/week: 0.0 oz    Comment: used to be moderate  . Drug use: No   Review of Systems Constitutional: Negative for fever. Cardiovascular: Negative for chest pain. Respiratory: Negative for shortness of breath. Gastrointestinal: Negative for abdominal pain, vomiting and diarrhea. Musculoskeletal: Negative for back pain. Skin: Positive for laparotomy wound Neurological: Negative for headaches, focal weakness or numbness.  All systems negative/normal/unremarkable except as stated in  the HPI  ____________________________________________   PHYSICAL EXAM:  VITAL SIGNS: ED Triage Vitals  Enc Vitals Group     BP 05/23/17 0848 (!) 154/109     Pulse Rate 05/23/17 0848 98     Resp 05/23/17 0848 16     Temp 05/23/17 0848 98.5 F (36.9 C)     Temp Source 05/23/17 0848 Oral     SpO2 05/23/17 0848 100 %     Weight 05/23/17 0849 177 lb (80.3 kg)     Height 05/23/17 0849 5\' 3"  (1.6 m)     Head Circumference --      Peak Flow --      Pain Score 05/23/17 0849 10     Pain Loc --      Pain Edu? --      Excl. in GC? --    Constitutional: Alert and oriented.  Mild distress Eyes: Conjunctivae are normal. Normal extraocular movements. Cardiovascular: Normal rate, regular rhythm. No murmurs, rubs, or gallops. Respiratory: Normal respiratory effort without tachypnea nor retractions. Breath sounds are clear and equal bilaterally. No wheezes/rales/rhonchi. Gastrointestinal: Colostomy site appears unremarkable, there is packing in the wound which is presumed to be previously dehisced laparotomy site.  Minimal skin erythema is noted Musculoskeletal: Nontender with normal range of motion in extremities. No lower extremity tenderness nor edema. Neurologic:  Normal speech and language. No gross focal neurologic deficits are appreciated.  Skin: Ostomy site, laparotomy wound with mild erythema Psychiatric: Mood and affect are normal. Speech and behavior are normal.  ____________________________________________  ED COURSE:  As part of my medical decision making, I reviewed the following data within the electronic MEDICAL RECORD NUMBER History obtained from family if available, nursing notes, old chart and ekg, as well as notes from prior ED visits. Patient presented for wounds and pain from colostomy, we will assess with labs and imaging as indicated at this time.   Procedures ____________________________________________   LABS (pertinent positives/negatives)  Labs Reviewed  CBC WITH  DIFFERENTIAL/PLATELET - Abnormal; Notable for the following components:      Result Value   RBC 3.77 (*)    Hemoglobin 11.0 (*)    HCT 31.8 (*)    RDW 14.8 (*)    Platelets 517 (*)    Neutro Abs 8.4 (*)    All other components within normal limits  COMPREHENSIVE METABOLIC PANEL - Abnormal; Notable for the following components:   Glucose, Bld 161 (*)    Calcium 8.8 (*)    ALT 16 (*)    All other components within normal limits  URINALYSIS, COMPLETE (UACMP) WITH MICROSCOPIC   ____________________________________________  DIFFERENTIAL DIAGNOSIS   Wound infection, wound complication, colostomy dysfunction  FINAL ASSESSMENT AND PLAN  Wound recheck   Plan: The patient had presented for issues with his ostomy bag and laparotomy wound. Patient's labs are reassuring.  We have redressed his wounds, I gave him a gram of Rocephin and Dilaudid for pain.  Otherwise he is receiving dressing changes Monday Wednesday Friday, I think this is adequate for now.  He is stable for outpatient follow-up.   Ulice DashJohnathan E Jennfer Gassen, MD   Note: This note was generated in part or whole with voice recognition software. Voice recognition is usually quite accurate but there are transcription errors that can and very often do occur. I apologize for any typographical errors that were not detected and corrected.     Emily FilbertWilliams, Paydon Carll E, MD 05/23/17 [phone]

## 2017-05-24 ENCOUNTER — Emergency Department: Payer: Medicaid Other

## 2017-05-24 ENCOUNTER — Encounter: Payer: Self-pay | Admitting: Emergency Medicine

## 2017-05-24 ENCOUNTER — Emergency Department
Admission: EM | Admit: 2017-05-24 | Discharge: 2017-05-24 | Disposition: A | Discharge status: Home / Self Care | Payer: Medicaid Other | Attending: Emergency Medicine | Admitting: Emergency Medicine

## 2017-05-24 DIAGNOSIS — E1149 Type 2 diabetes mellitus with other diabetic neurological complication: Secondary | ICD-10-CM | POA: Diagnosis not present

## 2017-05-24 DIAGNOSIS — Z79899 Other long term (current) drug therapy: Secondary | ICD-10-CM | POA: Insufficient documentation

## 2017-05-24 DIAGNOSIS — F17290 Nicotine dependence, other tobacco product, uncomplicated: Secondary | ICD-10-CM | POA: Insufficient documentation

## 2017-05-24 DIAGNOSIS — Z794 Long term (current) use of insulin: Secondary | ICD-10-CM | POA: Insufficient documentation

## 2017-05-24 DIAGNOSIS — R071 Chest pain on breathing: Secondary | ICD-10-CM

## 2017-05-24 DIAGNOSIS — R0789 Other chest pain: Secondary | ICD-10-CM | POA: Diagnosis not present

## 2017-05-24 DIAGNOSIS — B20 Human immunodeficiency virus [HIV] disease: Secondary | ICD-10-CM | POA: Diagnosis not present

## 2017-05-24 DIAGNOSIS — I10 Essential (primary) hypertension: Secondary | ICD-10-CM | POA: Insufficient documentation

## 2017-05-24 DIAGNOSIS — R079 Chest pain, unspecified: Secondary | ICD-10-CM | POA: Diagnosis present

## 2017-05-24 LAB — CBC
HCT: 33.8 % — ABNORMAL LOW (ref 40.0–52.0)
Hemoglobin: 11.2 g/dL — ABNORMAL LOW (ref 13.0–18.0)
MCH: 28.2 pg (ref 26.0–34.0)
MCHC: 33.2 g/dL (ref 32.0–36.0)
MCV: 84.9 fL (ref 80.0–100.0)
PLATELETS: 479 10*3/uL — AB (ref 150–440)
RBC: 3.98 MIL/uL — ABNORMAL LOW (ref 4.40–5.90)
RDW: 15.1 % — AB (ref 11.5–14.5)
WBC: 8.1 10*3/uL (ref 3.8–10.6)

## 2017-05-24 LAB — BASIC METABOLIC PANEL
Anion gap: 11 (ref 5–15)
BUN: 10 mg/dL (ref 6–20)
CALCIUM: 9.6 mg/dL (ref 8.9–10.3)
CO2: 26 mmol/L (ref 22–32)
CREATININE: 0.63 mg/dL (ref 0.61–1.24)
Chloride: 100 mmol/L — ABNORMAL LOW (ref 101–111)
GFR calc non Af Amer: >60 mL/min (ref >60)
Glucose, Bld: 179 mg/dL — ABNORMAL HIGH (ref 65–99)
Potassium: 4.1 mmol/L (ref 3.5–5.1)
SODIUM: 137 mmol/L (ref 135–145)

## 2017-05-24 LAB — TROPONIN I

## 2017-05-24 MED ORDER — HYDROMORPHONE HCL 1 MG/ML IJ SOLN
0.5000 mg | Freq: Once | INTRAMUSCULAR | Status: AC
  Administered 2017-05-24: 0.5 mg via INTRAVENOUS
  Filled 2017-05-24: qty 1

## 2017-05-24 MED ORDER — HYDROCODONE-ACETAMINOPHEN 5-325 MG PO TABS: 1.0000 | ORAL_TABLET | ORAL | 0 refills | Status: DC | PRN

## 2017-05-24 MED ORDER — ONDANSETRON HCL 4 MG/2ML IJ SOLN
4.0000 mg | Freq: Once | INTRAMUSCULAR | Status: AC
  Administered 2017-05-24: 4 mg via INTRAVENOUS
  Filled 2017-05-24: qty 2

## 2017-05-24 MED ORDER — IOPAMIDOL (ISOVUE-370) INJECTION 76%
100.0000 mL | Freq: Once | INTRAVENOUS | Status: AC | PRN
  Administered 2017-05-24: 100 mL via INTRAVENOUS

## 2017-05-24 NOTE — Discharge Instructions (Signed)
Please return for fever or shortness of breath worse pain or any other problems.  If need be take the hydrocodone 1 pill 4 times a day for pain.

## 2017-05-24 NOTE — ED Notes (Signed)
[facility] called for transport home 

## 2017-05-24 NOTE — ED Notes (Signed)
Nurse cleaned up pt. Pt has colonoscopy that the pt has had issues with bags. Nurse spoke to pt about making sure he speaks with his wound/osotomy nurse about refitting him for different bags.

## 2017-05-24 NOTE — ED Notes (Signed)
Patient's ostomy cleaned and bag and disc changed.

## 2017-05-24 NOTE — ED Provider Notes (Signed)
[facility] Emergency Department Provider Note   ____________________________________________   First MD Initiated Contact with Patient 05/24/17 1611     (approximate)  I have reviewed the triage vital signs and the nursing notes.   HISTORY  Chief Complaint Chest Pain   HPI Cory Campbell is a 37 y.o. male who reports sudden onset of sharp pleuritic chest pain in the left side of the ribs.  He said he is never had anything like this before.  Again it is worse with deep breathing and severe.  Patient has no other complaints at present except for that his ostomy still leaking a little bit.   Past Medical History:  Diagnosis Date  . Abrasion or friction burn of foot and toe(s), without mention of infection   . Acid reflux   . Adopted   . Allergic rhinitis, cause unspecified   . Anxiety   . Arthritis   . Arthrogryposis   . Asthma   . Bladder wall thickening   . BPH (benign prostatic hyperplasia)   . Chronic pain syndrome   . Depressive disorder, not elsewhere classified   . Diabetes mellitus without complication (HCC)   . Diverticulitis of colon (without mention of hemorrhage)(562.11)   . ED (erectile dysfunction)   . Herpes genitalis   . History of echocardiogram    a. 10/2016: EF 50-55%, normal wall motion  . History of stress test    a. 06/2011: significant GI uptake artifact, no evidence of ischemia, EF 56%  . HIV infection (HCC)   . HTN (hypertension)   . Hyperlipidemia   . Hypogonadism in male   . Myalgia and myositis, unspecified   . OAB (overactive bladder)   . Obesity   . Other psoriasis   . Premature baby    born premature  . Seizure disorder (HCC)   . Sleep apnea   . 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 or other cause, without mention of thyrotoxic crisis or storm    hyperthyroidism  . TIA (transient ischemic attack)   . Type II or unspecified type diabetes mellitus with neurological manifestations, not stated as  uncontrolled(250.60)     Patient Active Problem List   Diagnosis Date Noted  . Acute colitis 05/20/2017  . Colitis   . Acute renal failure (HCC) 05/15/2017  . Diarrhea 05/15/2017  . Sepsis (HCC) 05/15/2017  . Abdominal pain 11/25/2016  . Hypotension 10/08/2016  . Unstable angina (HCC) 10/06/2016  . Shortness of breath 06/23/2015  . Obesity 05/18/2014  . Frequent falls 08/12/2013  . Pain of left clavicle 08/12/2013  . Tachycardia 01/21/2013  . Obstructive sleep apnea on CPAP 06/07/2012  . Chest pain 06/20/2011  . Uncontrolled hypertension 06/20/2011  . Diabetes mellitus (HCC) 06/20/2011    Past Surgical History:  Procedure Laterality Date  . APPENDECTOMY    . COLOSTOMY    . CORNEAL TRANSPLANT    . feet surgery     both  . HAND SURGERY     left and right  . leg surgery     left and right  . ORTHOPEDIC SURGERY     hands, feet, knees, legs  . tubes in ears     both    Prior to Admission medications   Medication Sig Start Date End Date Taking? Authorizing Provider  acyclovir (ZOVIRAX) 400 MG tablet Take 400 mg by mouth 2 (two) times daily.    [provider]  albuterol (PROVENTIL HFA;VENTOLIN HFA) 108 (90 BASE) MCG/ACT inhaler Inhale  2 puffs into the lungs every 4 (four) hours as needed for wheezing or shortness of breath.     [provider]  albuterol (PROVENTIL) (2.5 MG/3ML) 0.083% nebulizer solution Take 2.5 mg by nebulization every 4 (four) hours as needed for wheezing or shortness of breath.    [provider]  alfuzosin (UROXATRAL) 10 MG 24 hr tablet Take 10 mg by mouth daily.    [provider]  aspirin 81 MG tablet Take 81 mg by mouth daily.    [provider]  BIKTARVY 50-200-25 MG TABS tablet Take 1 tablet by mouth daily. 11/21/16   [provider]  Buprenorphine (BUTRANS) 15 MCG/HR PTWK Place 15 mg onto the skin once a week. Saturday    [provider]  buPROPion (WELLBUTRIN XL) 150 MG 24 hr tablet  Take 1 tablet by mouth daily. 11/16/16   [provider]  ciprofloxacin (CIPRO) 500 MG tablet Take 1 tablet (500 mg total) by mouth 2 (two) times daily for 10 days. 05/22/17 06/01/17  Salary, Evelena Asa, MD  citalopram (CELEXA) 20 MG tablet Take 20 mg by mouth daily.    [provider]  diclofenac sodium (VOLTAREN) 1 % GEL Apply 2-4 g topically 4 (four) times daily as needed. For pain.    [provider]  dicyclomine (BENTYL) 20 MG tablet Take 20 mg by mouth every 6 (six) hours.    [provider]  famotidine (PEPCID) 20 MG tablet Take 20 mg by mouth daily.  01/03/16   [provider]  feeding supplement, GLUCERNA SHAKE, (GLUCERNA SHAKE) LIQD Take 237 mLs by mouth 4 (four) times daily.    [provider]  finasteride (PROSCAR) 5 MG tablet Take 5 mg by mouth daily.    [provider]  fluticasone (FLOVENT HFA) 110 MCG/ACT inhaler Inhale 2 puffs into the lungs 2 (two) times daily.    [provider]  gabapentin (NEURONTIN) 300 MG capsule Take 300 mg by mouth at bedtime.    [provider]  HYDROcodone-acetaminophen (NORCO) 5-325 MG tablet Take 1 tablet by mouth every 4 (four) hours as needed for moderate pain. 05/24/17   Arnaldo Natal, MD  hydrocortisone 2.5 % cream Apply 1 application topically 2 (two) times daily as needed for itching.     [provider]  insulin degludec (TRESIBA FLEXTOUCH) 100 UNIT/ML SOPN FlexTouch Pen Inject 80 Units into the skin at bedtime.     [provider]  ketoconazole (NIZORAL) 2 % shampoo Apply 1 application topically 2 (two) times a week. tues and thursday 08/27/14   [provider]  loratadine (CLARITIN) 10 MG tablet Take 10 mg by mouth daily.    [provider]  magnesium oxide (MAG-OX) 400 (241.3 Mg) MG tablet Take 1 tablet (400 mg total) by mouth daily. 05/23/17   Salary, Jetty Duhamel D, MD  Melatonin 5 MG TABS Take 10 mg by mouth at bedtime as needed  (sleep).    [provider]  metFORMIN (GLUCOPHAGE-XR) 750 MG 24 hr tablet Take 750 mg by mouth 2 (two) times daily.    [provider]  metoprolol tartrate (LOPRESSOR) 100 MG tablet Take 150 mg by mouth 2 (two) times daily.     [provider]  metroNIDAZOLE (FLAGYL) 500 MG tablet Take 1 tablet (500 mg total) by mouth every 8 (eight) hours. 05/22/17   Salary, Evelena Asa, MD  miconazole (MICOTIN) 2 % cream Apply 1 application topically 2 (two) times daily.  [provider]  mirtazapine (REMERON) 45 MG tablet Take 45 mg by mouth at bedtime.    [provider]  Multiple Vitamin (MULTIVITAMIN) tablet Take 1 tablet by mouth daily.    [provider]  naloxone [facility]) nasal spray 4 mg/0.1 mL One spray in either nostril once for known/suspected opioid overdose. May repeat every 2-3 minutes in alternating nostril til EMS arrives 01/12/16   [provider]  nicotine (NICODERM CQ - DOSED IN MG/24 HOURS) 14 mg/24hr patch Place 14 mg onto the skin daily.    [provider]  nicotine polacrilex (COMMIT) 4 MG lozenge Take 4 mg by mouth every 2 (two) hours as needed for smoking cessation.    [provider]  nitroGLYCERIN (NITROSTAT) 0.4 MG SL tablet DISSOLVE 1 TABLET UNDER THE TONGUE EVERY 5 MINUTES AS NEEDED FOR CHEST PAIN. CALL 911 IF CHEST IS NOT RESOLVED WITH 3 TABLETS 05/17/17   Antonieta Iba, MD  nortriptyline (PAMELOR) 25 MG capsule Take 50 mg by mouth at bedtime.    [provider]  NOVOLOG FLEXPEN 100 UNIT/ML FlexPen Inject 12 Units into the skin 3 (three) times daily with meals. With every meal and every snack 12/13/15   [provider]  nystatin-triamcinolone (MYCOLOG II) cream Apply 1 application topically 2 (two) times daily.    [provider]  omega-3 acid ethyl esters (LOVAZA) 1 g capsule Take 1 g by mouth daily.    [provider]  omeprazole (PRILOSEC) 20 MG capsule Take 20 mg by  mouth daily.    [provider]  ondansetron (ZOFRAN-ODT) 4 MG disintegrating tablet Take 4 mg by mouth every 8 (eight) hours as needed for nausea or vomiting.    [provider]  Oxycodone HCl 10 MG TABS Take 10-15 mg by mouth every 4 (four) hours as needed (pain).    [provider]  potassium chloride SA (K-DUR,KLOR-CON) 20 MEQ tablet take 1 tablet by mouth once daily if needed Patient taking differently: take 1 tablet by mouth once daily 02/08/16   Antonieta Iba, MD  prednisoLONE acetate (PRED FORTE) 1 % ophthalmic suspension Place 1 drop into the right eye daily.     [provider]  solifenacin (VESICARE) 10 MG tablet Take 10 mg by mouth daily.    [provider]  tiZANidine (ZANAFLEX) 4 MG capsule Take 4 mg by mouth 3 (three) times daily as needed for muscle spasms.    [provider]    Allergies Penicillins and Erythromycin base  Family History  Family history unknown: Yes    Social History Social History   Tobacco Use  . Smoking status: Current Every Day Smoker    Years: 0.00    Types: Pipe  . Smokeless tobacco: Never Used  . Tobacco comment: quit smoking friday 08/18/2016  Substance Use Topics  . Alcohol use: No    Alcohol/week: 0.0 oz    Comment: used to be moderate  . Drug use: No    Review of Systems  Constitutional: No fever/chills Eyes: No visual changes. ENT: No sore throat. Cardiovascular:  chest pain. Respiratory:  shortness of breath. Gastrointestinal: No abdominal pain.  No nausea, no vomiting.  No diarrhea.  No constipation. Genitourinary: Negative for dysuria. Musculoskeletal: Negative for back pain. Skin: Negative for rash. Neurological: Negative for headaches, focal weakness   ____________________________________________   PHYSICAL EXAM:  VITAL SIGNS: ED Triage Vitals  Enc Vitals Group     BP 05/24/17 1548 114/79  Pulse Rate 05/24/17 1548 94     Resp 05/24/17 1548 18     Temp  05/24/17 1548 98.4 F (36.9 C)     Temp Source 05/24/17 1548 Oral     SpO2 05/24/17 1548 100 %     Weight 05/24/17 1549 174 lb (78.9 kg)     Height --      Head Circumference --      Peak Flow --      Pain Score 05/24/17 1549 10     Pain Loc --      Pain Edu? --      Excl. in GC? --     Constitutional: Alert and oriented. Well appearing but moaning in pain Eyes: Conjunctivae are normal.  Head: Atraumatic. Nose: No congestion/rhinnorhea. Mouth/Throat: Mucous membranes are moist.  Oropharynx non-erythematous. Neck: No stridor. Cardiovascular: Normal rate, regular rhythm. Grossly normal heart sounds.  Good peripheral circulation. Respiratory: Normal respiratory effort.  No retractions. Lungs CTAB.  Chest is not tender to palpation Gastrointestinal: Soft and nontender. No distention. No abdominal bruits. No CVA tenderness.  Patient has an ostomy in the left lower quadrant and a wound packing in the midline.  This looks better than it did last time I saw him. Musculoskeletal: No lower extremity tenderness nor edema.  No joint effusions. Neurologic:  Normal speech and language. No gross focal neurologic deficits are appreciated. No gait instability. Skin:  Skin is warm, dry and intact. No rash noted. Psychiatric: Mood and affect are normal. Speech and behavior are normal.  ____________________________________________   LABS (all labs ordered are listed, but only abnormal results are displayed)  Labs Reviewed  BASIC METABOLIC PANEL - Abnormal; Notable for the following components:      Result Value   Chloride 100 (*)    Glucose, Bld 179 (*)    All other components within normal limits  CBC - Abnormal; Notable for the following components:   RBC 3.98 (*)    Hemoglobin 11.2 (*)    HCT 33.8 (*)    RDW 15.1 (*)    Platelets 479 (*)    All other components within normal limits  TROPONIN I  TROPONIN I   ____________________________________________  EKG  EKG read and  interpreted by me shows normal sinus rhythm rate of 95 normal axis no acute ST-T wave changes ____________________________________________  RADIOLOGY  ED MD interpretation: Chest x-ray reviewed by myself shows no acute disease CT of the chest shows no pulmonary emboli  Official radiology report(s): Dg Chest 2 View  Result Date: 05/24/2017 CLINICAL DATA:  Chest pain and shortness of breath. EXAM: CHEST - 2 VIEW COMPARISON:  01/04/2017. FINDINGS: Trachea is midline. Heart size stable. Lungs are somewhat low in volume but clear. No pleural. IMPRESSION: Negative. Electronically Signed   By: Leanna BattlesMelinda  Blietz M.D.   On: 05/24/2017 16:26   Ct Angio Chest Pe W And/or Wo Contrast  Result Date: 05/24/2017 CLINICAL DATA:  Chest pain, shortness of breath, pain wrapping around from mid chest to lower LEFT rib cage, history of asthma, type II diabetes mellitus, hypertension, HIV, smoker EXAM: CT ANGIOGRAPHY CHEST WITH CONTRAST TECHNIQUE: Multidetector CT imaging of the chest was performed using the standard protocol during bolus administration of intravenous contrast. Multiplanar CT image reconstructions and MIPs were obtained to evaluate the vascular anatomy. CONTRAST:  100mL ISOVUE-370 IOPAMIDOL (ISOVUE-370) INJECTION 76% IV COMPARISON:  10/06/2016 CTA chest, CT abdomen and pelvis 05/19/2017 FINDINGS: Cardiovascular: Aorta normal caliber without aneurysm or dissection. Minimal pericardial fluid.  Heart otherwise unremarkable. Pulmonary arteries adequately opacified and patent. No evidence of pulmonary embolism. Mediastinum/Nodes: Esophagus unremarkable. Base of cervical region normal appearance. No thoracic adenopathy. Lungs/Pleura: Minimal dependent atelectasis in RIGHT lung. Calcified granuloma RIGHT lower lobe. Minimal linear subsegmental atelectasis LEFT lower lobe. Lungs otherwise clear. No acute infiltrate, pleural effusion or pneumothorax. Upper Abdomen: Minimal stranding is noted in LEFT upper quadrant  images 84-91, nonspecific and unchanged since 05/19/2017, suspect related to recent abdominal surgery. Musculoskeletal: No acute osseous findings. Review of the MIP images confirms the above findings. IMPRESSION: No evidence of pulmonary embolism. No acute intrathoracic abnormalities. Electronically Signed   By: Ulyses Southward M.D.   On: 05/24/2017 17:14    ____________________________________________   PROCEDURES  Procedure(s) performed:   Procedures  Critical Care performed:   ____________________________________________   INITIAL IMPRESSION / ASSESSMENT AND PLAN / ED COURSE  EKG looks okay troponins are negative CT of the chest is negative I cannot find an etiology for the patient's pain and its better now.  There was never any abdominal pain.  I will let the patient go.  He will return if he is worse.        ____________________________________________   FINAL CLINICAL IMPRESSION(S) / ED DIAGNOSES  Final diagnoses:  Chest pain on breathing     ED Discharge Orders        Ordered    HYDROcodone-acetaminophen (NORCO) 5-325 MG tablet  Every 4 hours PRN     05/24/17 1927       Note:  This document was prepared using Dragon voice recognition software and may include unintentional dictation errors.    Arnaldo Natal, MD 05/24/17 1929

## 2017-05-24 NOTE — ED Triage Notes (Signed)
Pt c/o CP and SOB. Pt stating that he has never had this happen before. Pt stating that pain wraps around from mid chest to left lower rib cage. [facility] stating that pt was here yesterday for pain near ostomy site. Pt moaning at this time. Pt has and colostomy.

## 2017-05-25 ENCOUNTER — Emergency Department
Admission: EM | Admit: 2017-05-25 | Discharge: 2017-05-25 | Disposition: A | Discharge status: Home / Self Care | Payer: Medicaid Other | Attending: Emergency Medicine | Admitting: Emergency Medicine

## 2017-05-25 ENCOUNTER — Other Ambulatory Visit: Payer: Self-pay

## 2017-05-25 DIAGNOSIS — J45909 Unspecified asthma, uncomplicated: Secondary | ICD-10-CM | POA: Diagnosis not present

## 2017-05-25 DIAGNOSIS — Z794 Long term (current) use of insulin: Secondary | ICD-10-CM | POA: Insufficient documentation

## 2017-05-25 DIAGNOSIS — Z7982 Long term (current) use of aspirin: Secondary | ICD-10-CM | POA: Diagnosis not present

## 2017-05-25 DIAGNOSIS — Z947 Corneal transplant status: Secondary | ICD-10-CM | POA: Insufficient documentation

## 2017-05-25 DIAGNOSIS — K9400 Colostomy complication, unspecified: Secondary | ICD-10-CM

## 2017-05-25 DIAGNOSIS — E0590 Thyrotoxicosis, unspecified without thyrotoxic crisis or storm: Secondary | ICD-10-CM | POA: Insufficient documentation

## 2017-05-25 DIAGNOSIS — Z79899 Other long term (current) drug therapy: Secondary | ICD-10-CM | POA: Insufficient documentation

## 2017-05-25 DIAGNOSIS — E119 Type 2 diabetes mellitus without complications: Secondary | ICD-10-CM | POA: Diagnosis not present

## 2017-05-25 DIAGNOSIS — Z8673 Personal history of transient ischemic attack (TIA), and cerebral infarction without residual deficits: Secondary | ICD-10-CM | POA: Insufficient documentation

## 2017-05-25 DIAGNOSIS — K9403 Colostomy malfunction: Secondary | ICD-10-CM | POA: Insufficient documentation

## 2017-05-25 DIAGNOSIS — F419 Anxiety disorder, unspecified: Secondary | ICD-10-CM | POA: Diagnosis not present

## 2017-05-25 DIAGNOSIS — K9409 Other complications of colostomy: Secondary | ICD-10-CM | POA: Diagnosis present

## 2017-05-25 DIAGNOSIS — F329 Major depressive disorder, single episode, unspecified: Secondary | ICD-10-CM | POA: Diagnosis not present

## 2017-05-25 LAB — CBC
HCT: 32.6 % — ABNORMAL LOW (ref 40.0–52.0)
Hemoglobin: 11 g/dL — ABNORMAL LOW (ref 13.0–18.0)
MCH: 28.4 pg (ref 26.0–34.0)
MCHC: 33.7 g/dL (ref 32.0–36.0)
MCV: 84.5 fL (ref 80.0–100.0)
Platelets: 447 10*3/uL — ABNORMAL HIGH (ref 150–440)
RBC: 3.86 MIL/uL — ABNORMAL LOW (ref 4.40–5.90)
RDW: 15.2 % — AB (ref 11.5–14.5)
WBC: 8.4 10*3/uL (ref 3.8–10.6)

## 2017-05-25 LAB — BASIC METABOLIC PANEL
ANION GAP: 10 (ref 5–15)
BUN: 12 mg/dL (ref 6–20)
CALCIUM: 9.7 mg/dL (ref 8.9–10.3)
CO2: 26 mmol/L (ref 22–32)
Chloride: 101 mmol/L (ref 101–111)
Creatinine, Ser: 0.65 mg/dL (ref 0.61–1.24)
GLUCOSE: 204 mg/dL — AB (ref 65–99)
Potassium: 4.5 mmol/L (ref 3.5–5.1)
Sodium: 137 mmol/L (ref 135–145)

## 2017-05-25 MED ORDER — HYDROMORPHONE HCL 1 MG/ML IJ SOLN
1.0000 mg | Freq: Once | INTRAMUSCULAR | Status: AC
  Administered 2017-05-25: 1 mg via INTRAVENOUS
  Filled 2017-05-25: qty 1

## 2017-05-25 MED ORDER — NYSTATIN 100000 UNIT/GM EX POWD
Freq: Once | CUTANEOUS | Status: AC
  Administered 2017-05-25: 18:00:00 via TOPICAL
  Filled 2017-05-25: qty 15

## 2017-05-25 NOTE — Discharge Instructions (Addendum)
[facility].  Call the 24-hour nurse line and let them know that you are running low on supplies and will need new supplies likely before your visit on Tuesday.  Please continue to change the urostomy pouch as recommended by your ostomy care nurse.

## 2017-05-25 NOTE — ED Notes (Signed)
Reviewed discharge instructions, follow-up care with patient. Patient verbalized understanding of all information reviewed. Patient stable, with no distress noted at this time.    

## 2017-05-25 NOTE — ED Notes (Signed)
Report to Shannon, RN  

## 2017-05-25 NOTE — ED Notes (Signed)
Ostomy site changed and cleaned.

## 2017-05-25 NOTE — ED Notes (Signed)
Powder cleaned off pt and new ileostomy bag placed. Wound dressed with sterile gauze. Pt verbalized understanding of care and changing of wound and ostomy. New gabs given after pt reports he did not have any bags left.

## 2017-05-25 NOTE — ED Triage Notes (Signed)
To ER via [facility] from home c/o ostomy burning, redness and pain. Pt had this ostomy done 3/27. Has been to ER x 2 for same. Yellow thin fluid leaking from site on arrival, tenderness to touch. Pt alert and oriented X4, active, cooperative, pt in NAD. RR even and unlabored, color WNL.

## 2017-05-25 NOTE — ED Provider Notes (Addendum)
[facility] Emergency Department Provider Note  Time seen: 3:44 PM  I have reviewed the triage vital signs and the nursing notes.   HISTORY  Chief Complaint Other (Ostomy Problem)    HPI Cory ComeCharles J Campbell is a 37 y.o. male with a past medical history of anxiety, diabetes, hypertension, hyperlipidemia, HIV, seizure disorder, presents to the emergency department for pain around his ostomy site.  According to the patient and record review patient had an ostomy performed 05/02/17 was discharged from the hospital approximately 1 week ago.  Has been back to the emergency department now 3 times for similar complaints of ostomy pain.  Patient states the skin is very raw around the ostomy site very painful to him.  Denies any other abdominal pain besides superficial pain around the skin below the ostomy and states the ostomy is leaking again.  Largely negative review of systems.  Describes the pain as severe burning type pain.  Patient is moaning in bed due to the pain but is able to answer questions appropriately.   Past Medical History:  Diagnosis Date  . Abrasion or friction burn of foot and toe(s), without mention of infection   . Acid reflux   . Adopted   . Allergic rhinitis, cause unspecified   . Anxiety   . Arthritis   . Arthrogryposis   . Asthma   . Bladder wall thickening   . BPH (benign prostatic hyperplasia)   . Chronic pain syndrome   . Depressive disorder, not elsewhere classified   . Diabetes mellitus without complication (HCC)   . Diverticulitis of colon (without mention of hemorrhage)(562.11)   . ED (erectile dysfunction)   . Herpes genitalis   . History of echocardiogram    a. 10/2016: EF 50-55%, normal wall motion  . History of stress test    a. 06/2011: significant GI uptake artifact, no evidence of ischemia, EF 56%  . HIV infection (HCC)   . HTN (hypertension)   . Hyperlipidemia   . Hypogonadism in male   . Myalgia and myositis, unspecified    . OAB (overactive bladder)   . Obesity   . Other psoriasis   . Premature baby    born premature  . Seizure disorder (HCC)   . Sleep apnea   . 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 or other cause, without mention of thyrotoxic crisis or storm    hyperthyroidism  . TIA (transient ischemic attack)   . Type II or unspecified type diabetes mellitus with neurological manifestations, not stated as uncontrolled(250.60)     Patient Active Problem List   Diagnosis Date Noted  . Acute colitis 05/20/2017  . Colitis   . Acute renal failure (HCC) 05/15/2017  . Diarrhea 05/15/2017  . Sepsis (HCC) 05/15/2017  . Abdominal pain 11/25/2016  . Hypotension 10/08/2016  . Unstable angina (HCC) 10/06/2016  . Shortness of breath 06/23/2015  . Obesity 05/18/2014  . Frequent falls 08/12/2013  . Pain of left clavicle 08/12/2013  . Tachycardia 01/21/2013  . Obstructive sleep apnea on CPAP 06/07/2012  . Chest pain 06/20/2011  . Uncontrolled hypertension 06/20/2011  . Diabetes mellitus (HCC) 06/20/2011    Past Surgical History:  Procedure Laterality Date  . APPENDECTOMY    . COLOSTOMY    . CORNEAL TRANSPLANT    . feet surgery     both  . HAND SURGERY     left and right  . leg surgery     left and right  . ORTHOPEDIC SURGERY  hands, feet, knees, legs  . tubes in ears     both    Prior to Admission medications   Medication Sig Start Date End Date Taking? Authorizing Provider  acyclovir (ZOVIRAX) 400 MG tablet Take 400 mg by mouth 2 (two) times daily.    [provider]  albuterol (PROVENTIL HFA;VENTOLIN HFA) 108 (90 BASE) MCG/ACT inhaler Inhale 2 puffs into the lungs every 4 (four) hours as needed for wheezing or shortness of breath.     [provider]  albuterol (PROVENTIL) (2.5 MG/3ML) 0.083% nebulizer solution Take 2.5 mg by nebulization every 4 (four) hours as needed for wheezing or shortness of breath.    [provider]  alfuzosin (UROXATRAL) 10 MG 24  hr tablet Take 10 mg by mouth daily.    [provider]  aspirin 81 MG tablet Take 81 mg by mouth daily.    [provider]  BIKTARVY 50-200-25 MG TABS tablet Take 1 tablet by mouth daily. 11/21/16   [provider]  Buprenorphine (BUTRANS) 15 MCG/HR PTWK Place 15 mg onto the skin once a week. Saturday    [provider]  buPROPion (WELLBUTRIN XL) 150 MG 24 hr tablet Take 1 tablet by mouth daily. 11/16/16   [provider]  ciprofloxacin (CIPRO) 500 MG tablet Take 1 tablet (500 mg total) by mouth 2 (two) times daily for 10 days. 05/22/17 06/01/17  Salary, Evelena Asa, MD  citalopram (CELEXA) 20 MG tablet Take 20 mg by mouth daily.    [provider]  diclofenac sodium (VOLTAREN) 1 % GEL Apply 2-4 g topically 4 (four) times daily as needed. For pain.    [provider]  dicyclomine (BENTYL) 20 MG tablet Take 20 mg by mouth every 6 (six) hours.    [provider]  famotidine (PEPCID) 20 MG tablet Take 20 mg by mouth daily.  01/03/16   [provider]  feeding supplement, GLUCERNA SHAKE, (GLUCERNA SHAKE) LIQD Take 237 mLs by mouth 4 (four) times daily.    [provider]  finasteride (PROSCAR) 5 MG tablet Take 5 mg by mouth daily.    [provider]  fluticasone (FLOVENT HFA) 110 MCG/ACT inhaler Inhale 2 puffs into the lungs 2 (two) times daily.    [provider]  gabapentin (NEURONTIN) 300 MG capsule Take 300 mg by mouth at bedtime.    [provider]  HYDROcodone-acetaminophen (NORCO) 5-325 MG tablet Take 1 tablet by mouth every 4 (four) hours as needed for moderate pain. 05/24/17   Arnaldo Natal, MD  hydrocortisone 2.5 % cream Apply 1 application topically 2 (two) times daily as needed for itching.     [provider]  insulin degludec (TRESIBA FLEXTOUCH) 100 UNIT/ML SOPN FlexTouch Pen Inject 80 Units into the skin at bedtime.     [provider]  ketoconazole  (NIZORAL) 2 % shampoo Apply 1 application topically 2 (two) times a week. tues and thursday 08/27/14   [provider]  loratadine (CLARITIN) 10 MG tablet Take 10 mg by mouth daily.    [provider]  magnesium oxide (MAG-OX) 400 (241.3 Mg) MG tablet Take 1 tablet (400 mg total) by mouth daily. 05/23/17   Salary, Jetty Duhamel D, MD  Melatonin 5 MG TABS Take 10 mg by mouth at bedtime as needed (sleep).    [provider]  metFORMIN (GLUCOPHAGE-XR) 750 MG 24 hr tablet Take 750 mg by mouth 2 (two) times daily.    [provider]  metoprolol tartrate (LOPRESSOR) 100 MG tablet Take 150 mg by mouth 2 (two) times daily.     [provider]  metroNIDAZOLE (FLAGYL) 500 MG tablet Take 1 tablet (500 mg total) by mouth every 8 (eight) hours. 05/22/17   Salary, Evelena Asa, MD  miconazole (MICOTIN) 2 % cream Apply 1 application topically 2 (two) times daily.    [provider]  mirtazapine (REMERON) 45 MG tablet Take 45 mg by mouth at bedtime.    [provider]  Multiple Vitamin (MULTIVITAMIN) tablet Take 1 tablet by mouth daily.    [provider]  naloxone [facility]) nasal spray 4 mg/0.1 mL One spray in either nostril once for known/suspected opioid overdose. May repeat every 2-3 minutes in alternating nostril til EMS arrives 01/12/16   [provider]  nicotine (NICODERM CQ - DOSED IN MG/24 HOURS) 14 mg/24hr patch Place 14 mg onto the skin daily.    [provider]  nicotine polacrilex (COMMIT) 4 MG lozenge Take 4 mg by mouth every 2 (two) hours as needed for smoking cessation.    [provider]  nitroGLYCERIN (NITROSTAT) 0.4 MG SL tablet DISSOLVE 1 TABLET UNDER THE TONGUE EVERY 5 MINUTES AS NEEDED FOR CHEST PAIN. CALL 911 IF CHEST IS NOT RESOLVED WITH 3 TABLETS 05/17/17   Antonieta Iba, MD  nortriptyline (PAMELOR) 25 MG capsule Take 50 mg by mouth at bedtime.    [provider]  NOVOLOG FLEXPEN 100 UNIT/ML  FlexPen Inject 12 Units into the skin 3 (three) times daily with meals. With every meal and every snack 12/13/15   [provider]  nystatin-triamcinolone (MYCOLOG II) cream Apply 1 application topically 2 (two) times daily.    [provider]  omega-3 acid ethyl esters (LOVAZA) 1 g capsule Take 1 g by mouth daily.    [provider]  omeprazole (PRILOSEC) 20 MG capsule Take 20 mg by mouth daily.    [provider]  ondansetron (ZOFRAN-ODT) 4 MG disintegrating tablet Take 4 mg by mouth every 8 (eight) hours as needed for nausea or vomiting.    [provider]  Oxycodone HCl 10 MG TABS Take 10-15 mg by mouth every 4 (four) hours as needed (pain).    [provider]  potassium chloride SA (K-DUR,KLOR-CON) 20 MEQ tablet take 1 tablet by mouth once daily if needed Patient taking differently: take 1 tablet by mouth once daily 02/08/16   Antonieta Iba, MD  prednisoLONE acetate (PRED FORTE) 1 % ophthalmic suspension Place 1 drop into the right eye daily.     [provider]  solifenacin (VESICARE) 10 MG tablet Take 10 mg by mouth daily.    [provider]  tiZANidine (ZANAFLEX) 4 MG capsule Take 4 mg by mouth 3 (three) times daily as needed for muscle spasms.    [provider]    Allergies  Allergen Reactions  . Penicillins Hives, Itching and Rash    Has patient had a PCN reaction causing immediate rash, facial/tongue/throat swelling, SOB or lightheadedness with hypotension: Yes Has patient had a PCN reaction causing severe rash involving mucus membranes or skin necrosis: No Has patient had a PCN reaction that required hospitalization No Has patient had a PCN reaction occurring within the last 10 years: No If all of the above answers are "NO", then may proceed with Cephalosporin use.   . Erythromycin Base Itching and Rash    Family History  Family history unknown: Yes    Social History  Social History   Tobacco  Use  . Smoking status: Current Every Day Smoker    Years: 0.00    Types: Pipe  . Smokeless tobacco: Never Used  . Tobacco comment: quit smoking friday 08/18/2016  Substance Use Topics  . Alcohol use: No    Alcohol/week: 0.0 oz    Comment: used to be moderate  . Drug use: No    Review of Systems Constitutional: Negative for fever. Eyes: Negative for visual complaints ENT: Negative for recent illness/congestion Cardiovascular: Negative for chest pain. Respiratory: Negative for shortness of breath. Gastrointestinal: Pain across the lower abdominal wall below his ostomy site where he has raw appearing skin, that is erythematous.  Mild leakage of the ostomy. Genitourinary: Negative for urinary compaints Musculoskeletal: Negative for musculoskeletal complaints Skin: Erythematous and painful skin below the ostomy. Neurological: Negative for headache All other ROS negative  ____________________________________________   PHYSICAL EXAM:  VITAL SIGNS: ED Triage Vitals [05/25/17 1450]  Enc Vitals Group     BP 136/85     Pulse Rate 98     Resp 20     Temp 98.5 F (36.9 C)     Temp Source Oral     SpO2 100 %     Weight 174 lb (78.9 kg)     Height 5\' 3"  (1.6 m)     Head Circumference      Peak Flow      Pain Score 10     Pain Loc      Pain Edu?      Excl. in GC?    Constitutional: Alert and oriented. Well appearing and in no distress. Eyes: Normal exam ENT   Head: Normocephalic and atraumatic.   Mouth/Throat: Mucous membranes are moist. Cardiovascular: Normal rate, regular rhythm.  Respiratory: Normal respiratory effort without tachypnea nor retractions. Breath sounds are clear Gastrointestinal: Soft and nontender abdomen.  Patient does have erythematous, raw appearing skin below the ostomy that is tender to palpation, ostomy is slightly leaking on the lower aspect of the ostomy where applied to the skin. Musculoskeletal: Nontender with normal range of motion in all  extremities. Neurologic:  Normal speech and language. No gross focal neurologic deficits  Skin: Skin is warm, patient does have erythematous raw appearing skin below the ostomy site with mild leakage of the ostomy.  Very tender to palpation in this area, but appears to be superficially tender not tender to deep palpation. Psychiatric: Mood and affect are normal.  ____________________________________________   INITIAL IMPRESSION / ASSESSMENT AND PLAN / ED COURSE  Pertinent labs & imaging results that were available during my care of the patient were reviewed by me and considered in my medical decision making (see chart for details).  Patient presents to the emergency department for pain and raw skin around his ostomy site.  Patient was seen by the ostomy nurse 05/23/17 with recommendations made (see consult note for full recommendations).  I have re-paged the ostomy nurse for any further possible recommendations.  Overall the skin is erythematous, but no longer weeping, and reviewing the patient's records it appeared to have been weeping at some point, so likely improving although I have not seen it previously.  Patient has been seen now 3 times in the emergency department since his discharge 05/22/17, for similar complaints.  Patient has been seen by Dr. Earlene Plater on 05/22/17 for the same complaint.  Seen by the wound care/ostomy nurse 05/23/17 with recommendations made.  We will check basic labs attempt to consult  the ostomy nurse once again although I do not believe there would be any further recommendations as her recommendations were made 2 days ago.  Consult recommendations from 05/23/17: Pt is familiar to WOC from recent admission; pt was discharged less than 24 hours ago. Reason for Consult:Consult requested for abd wound and ileosotmy. He states hispouch would not adhere to his skin. Wound type:Midline full thickness post-op wound previously had a Vac dressing prior to admission, but was  trapping stool underneath the sponge since the ostomy is leaking constantly. The Vac should be left off until a seal can be maintained for the ostomy pouch.  Middle abd wound is beefy red; 9X2X.2cm, small amt yellow drainage, no odor. Lower abd scar tissue is beginning to break down and has evolved into a partial thickness wound; 2X.2X.1cm, red and moist, related to constantly leaking stool. Apply moist gauze to abd and tape until leaking ostomy is resolved.   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 Skin is red and macerated to 4 inches surrounding stoma and pouches will not adhere related to moisture from effluent draining onto the skin. Pt has large amt pain when the location is touched or cleansed. Medicated for pain and attempted to protect skin with crusting technique. Applied small [place] pouch to drainage bag.  Dressing procedure/placement/frequency: Instructions provided for staff nurses to follow if patient is admitted:Bedside nurse: please change pouch PRN if leaking. (Use small [place] pouch [phone])  Apply as follows: Clean skin around stoma with water, then apply antifungal powder, then wipe with skin prep pads to protect the skin and eliminate excess powder. Cut opening in the pouch, using pattern left in the room. Apply over the stoma and attach to a bedside drainage bag.  Demonstrated application process and emptying and patient states he has supplies at home and will have home health assistance after discharge.  It will be difficult to maintain a pouch seal while skin remains moist, weeping, and macerated. Please re-consult if further assistance is needed.  Mardee Postin MSN, RN, CWOCN, [place], CNS [phone]  The wound appears to be improving compared to the description provided above by wound care nurse on 05/23/17.   Unable to reach the wound care nurse.  However in the  emergency department we will replace the patient's ostomy, use nystatin powder as described above by the ostomy nurse.  We will discharge with a short course of pain medication if needed.  The wound at least per the wound care nurses description appears to be improving compared to my evaluation today.  Patient's labs are reassuring, normal white blood cell count.  Do not believe any further imaging or evaluation is needed at this time given recent surgical and ostomy wound care consultations.  I also discussed the patient with Dr. Earlene Plater, no further recommendations at this time.  Ostomy was performed at [facility] and the patient has a follow-up with his surgical team on Tuesday.  We have change the ostomy pouch per ostomy nurse recommendations on 05/23/17.  Patient agreeable to plan to discharge home with surgical follow-up on Tuesday.   ____________________________________________   FINAL CLINICAL IMPRESSION(S) / ED DIAGNOSES  Leaking ostomy Pain around ostomy site    Minna Antis, MD 05/25/17 1639    Minna Antis, MD 05/25/17 [phone]

## 2017-05-27 DIAGNOSIS — T8189XA Other complications of procedures, not elsewhere classified, initial encounter: Secondary | ICD-10-CM

## 2017-05-28 NOTE — ED Provider Notes (Signed)
Note from 4/13 is tagged.  Tag is for EKG interpretation which is below EKG read and interpreted by me shows sinus tachycardia rate of 111 normal axis nonspecific ST-T wave changes   Arnaldo NatalMalinda, Paul F, MD 05/28/17 1132

## 2017-06-01 LAB — HIV ANTIBODY (ROUTINE TESTING W REFLEX): HIV SCREEN 4TH GENERATION: REACTIVE — AB

## 2017-07-20 ENCOUNTER — Emergency Department
Admission: EM | Admit: 2017-07-20 | Discharge: 2017-07-20 | Disposition: A | Discharge status: Home / Self Care | Payer: Medicaid Other | Attending: Emergency Medicine | Admitting: Emergency Medicine

## 2017-07-20 ENCOUNTER — Other Ambulatory Visit: Payer: Self-pay

## 2017-07-20 DIAGNOSIS — B20 Human immunodeficiency virus [HIV] disease: Secondary | ICD-10-CM | POA: Insufficient documentation

## 2017-07-20 DIAGNOSIS — Z79899 Other long term (current) drug therapy: Secondary | ICD-10-CM | POA: Diagnosis not present

## 2017-07-20 DIAGNOSIS — Z794 Long term (current) use of insulin: Secondary | ICD-10-CM | POA: Diagnosis not present

## 2017-07-20 DIAGNOSIS — M25561 Pain in right knee: Secondary | ICD-10-CM | POA: Insufficient documentation

## 2017-07-20 DIAGNOSIS — Z7982 Long term (current) use of aspirin: Secondary | ICD-10-CM | POA: Insufficient documentation

## 2017-07-20 DIAGNOSIS — I10 Essential (primary) hypertension: Secondary | ICD-10-CM | POA: Diagnosis not present

## 2017-07-20 DIAGNOSIS — J45909 Unspecified asthma, uncomplicated: Secondary | ICD-10-CM | POA: Insufficient documentation

## 2017-07-20 DIAGNOSIS — G8929 Other chronic pain: Secondary | ICD-10-CM

## 2017-07-20 DIAGNOSIS — E119 Type 2 diabetes mellitus without complications: Secondary | ICD-10-CM | POA: Insufficient documentation

## 2017-07-20 DIAGNOSIS — Z8673 Personal history of transient ischemic attack (TIA), and cerebral infarction without residual deficits: Secondary | ICD-10-CM | POA: Diagnosis not present

## 2017-07-20 DIAGNOSIS — M25512 Pain in left shoulder: Secondary | ICD-10-CM | POA: Diagnosis not present

## 2017-07-20 DIAGNOSIS — F17290 Nicotine dependence, other tobacco product, uncomplicated: Secondary | ICD-10-CM | POA: Diagnosis not present

## 2017-07-20 MED ORDER — OXYCODONE HCL 5 MG PO TABS: 5.0000 mg | ORAL_TABLET | ORAL | 0 refills | Status: DC | PRN

## 2017-07-20 MED ORDER — OXYCODONE-ACETAMINOPHEN 5-325 MG PO TABS
2.0000 | ORAL_TABLET | Freq: Once | ORAL | Status: AC
  Administered 2017-07-20: 2 via ORAL
  Filled 2017-07-20: qty 2

## 2017-07-20 NOTE — ED Notes (Signed)
This RN apologized repeatedly to patient regarding delay in patient receiving pain medication. Pt states understanding. Will continue to monitor.

## 2017-07-20 NOTE — ED Provider Notes (Addendum)
[facility] Emergency Department Provider Note ___________________________________________   First MD Initiated Contact with Patient 07/20/17 1607     (approximate)  I have reviewed the triage vital signs and the nursing notes.   HISTORY  Chief Complaint Leg Pain and Shoulder Pain  HPI Cory Campbell is a 37 y.o. male with a history of chronic shoulder as well as right knee pain who is presenting with worsening left shoulder and right knee pain after missing a steroid injection 1 month ago.  He says that he had his ostomy reversed as well and has not been able to follow-up for repeat steroid injections.  He is not reporting any fever or joint swelling.  Past Medical History:  Diagnosis Date  . Abrasion or friction burn of foot and toe(s), without mention of infection   . Acid reflux   . Adopted   . Allergic rhinitis, cause unspecified   . Anxiety   . Arthritis   . Arthrogryposis   . Asthma   . Bladder wall thickening   . BPH (benign prostatic hyperplasia)   . Chronic pain syndrome   . Depressive disorder, not elsewhere classified   . Diabetes mellitus without complication (HCC)   . Diverticulitis of colon (without mention of hemorrhage)(562.11)   . ED (erectile dysfunction)   . Herpes genitalis   . History of echocardiogram    a. 10/2016: EF 50-55%, normal wall motion  . History of stress test    a. 06/2011: significant GI uptake artifact, no evidence of ischemia, EF 56%  . HIV infection (HCC)   . HTN (hypertension)   . Hyperlipidemia   . Hypogonadism in male   . Myalgia and myositis, unspecified   . OAB (overactive bladder)   . Obesity   . Other psoriasis   . Premature baby    born premature  . Seizure disorder (HCC)   . Sleep apnea   . 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 or other cause, without mention of thyrotoxic crisis or storm    hyperthyroidism  . TIA (transient ischemic attack)   . Type II or unspecified type diabetes  mellitus with neurological manifestations, not stated as uncontrolled(250.60)     Patient Active Problem List   Diagnosis Date Noted  . Surgical wound, non healing   . Acute colitis 05/20/2017  . Colitis   . Acute renal failure (HCC) 05/15/2017  . Diarrhea 05/15/2017  . Sepsis (HCC) 05/15/2017  . Abdominal pain 11/25/2016  . Hypotension 10/08/2016  . Unstable angina (HCC) 10/06/2016  . Shortness of breath 06/23/2015  . Obesity 05/18/2014  . Frequent falls 08/12/2013  . Pain of left clavicle 08/12/2013  . Tachycardia 01/21/2013  . Obstructive sleep apnea on CPAP 06/07/2012  . Chest pain 06/20/2011  . Uncontrolled hypertension 06/20/2011  . Diabetes mellitus (HCC) 06/20/2011    Past Surgical History:  Procedure Laterality Date  . APPENDECTOMY    . COLOSTOMY    . CORNEAL TRANSPLANT    . feet surgery     both  . HAND SURGERY     left and right  . leg surgery     left and right  . ORTHOPEDIC SURGERY     hands, feet, knees, legs  . tubes in ears     both    Prior to Admission medications   Medication Sig Start Date End Date Taking? Authorizing Provider  acyclovir (ZOVIRAX) 400 MG tablet Take 400 mg by mouth 2 (two) times daily.  [provider]  albuterol (PROVENTIL HFA;VENTOLIN HFA) 108 (90 BASE) MCG/ACT inhaler Inhale 2 puffs into the lungs every 4 (four) hours as needed for wheezing or shortness of breath.     [provider]  albuterol (PROVENTIL) (2.5 MG/3ML) 0.083% nebulizer solution Take 2.5 mg by nebulization every 4 (four) hours as needed for wheezing or shortness of breath.    [provider]  alfuzosin (UROXATRAL) 10 MG 24 hr tablet Take 10 mg by mouth daily.    [provider]  aspirin 81 MG tablet Take 81 mg by mouth daily.    [provider]  BIKTARVY 50-200-25 MG TABS tablet Take 1 tablet by mouth daily. 11/21/16   [provider]  Buprenorphine (BUTRANS) 15 MCG/HR PTWK Place 15 mg onto the skin once a  week. Saturday    [provider]  buPROPion (WELLBUTRIN XL) 150 MG 24 hr tablet Take 1 tablet by mouth daily. 11/16/16   [provider]  citalopram (CELEXA) 20 MG tablet Take 20 mg by mouth daily.    [provider]  diclofenac sodium (VOLTAREN) 1 % GEL Apply 2-4 g topically 4 (four) times daily as needed. For pain.    [provider]  dicyclomine (BENTYL) 20 MG tablet Take 20 mg by mouth every 6 (six) hours.    [provider]  famotidine (PEPCID) 20 MG tablet Take 20 mg by mouth daily.  01/03/16   [provider]  feeding supplement, GLUCERNA SHAKE, (GLUCERNA SHAKE) LIQD Take 237 mLs by mouth 4 (four) times daily.    [provider]  finasteride (PROSCAR) 5 MG tablet Take 5 mg by mouth daily.    [provider]  fluticasone (FLOVENT HFA) 110 MCG/ACT inhaler Inhale 2 puffs into the lungs 2 (two) times daily.    [provider]  gabapentin (NEURONTIN) 300 MG capsule Take 300 mg by mouth at bedtime.    [provider]  HYDROcodone-acetaminophen (NORCO) 5-325 MG tablet Take 1 tablet by mouth every 4 (four) hours as needed for moderate pain. 05/24/17   Arnaldo Natal, MD  hydrocortisone 2.5 % cream Apply 1 application topically 2 (two) times daily as needed for itching.     [provider]  insulin degludec (TRESIBA FLEXTOUCH) 100 UNIT/ML SOPN FlexTouch Pen Inject 80 Units into the skin at bedtime.     [provider]  ketoconazole (NIZORAL) 2 % shampoo Apply 1 application topically 2 (two) times a week. tues and thursday 08/27/14   [provider]  loratadine (CLARITIN) 10 MG tablet Take 10 mg by mouth daily.    [provider]  magnesium oxide (MAG-OX) 400 (241.3 Mg) MG tablet Take 1 tablet (400 mg total) by mouth daily. 05/23/17   Salary, Jetty Duhamel D, MD  Melatonin 5 MG TABS Take 10 mg by mouth at bedtime as needed (sleep).    [provider]  metFORMIN (GLUCOPHAGE-XR)  750 MG 24 hr tablet Take 750 mg by mouth 2 (two) times daily.    [provider]  metoprolol tartrate (LOPRESSOR) 100 MG tablet Take 150 mg by mouth 2 (two) times daily.     [provider]  metroNIDAZOLE (FLAGYL) 500 MG tablet Take 1 tablet (500 mg total) by mouth every 8 (eight) hours. 05/22/17   Salary, Evelena Asa, MD  miconazole (MICOTIN) 2 % cream Apply 1 application topically 2 (two) times daily.    [provider]  mirtazapine (REMERON) 45 MG tablet Take 45 mg by  mouth at bedtime.    [provider]  Multiple Vitamin (MULTIVITAMIN) tablet Take 1 tablet by mouth daily.    [provider]  naloxone [facility](NARCAN) nasal spray 4 mg/0.1 mL One spray in either nostril once for known/suspected opioid overdose. May repeat every 2-3 minutes in alternating nostril til EMS arrives 01/12/16   [provider]  nicotine (NICODERM CQ - DOSED IN MG/24 HOURS) 14 mg/24hr patch Place 14 mg onto the skin daily.    [provider]  nicotine polacrilex (COMMIT) 4 MG lozenge Take 4 mg by mouth every 2 (two) hours as needed for smoking cessation.    [provider]  nitroGLYCERIN (NITROSTAT) 0.4 MG SL tablet DISSOLVE 1 TABLET UNDER THE TONGUE EVERY 5 MINUTES AS NEEDED FOR CHEST PAIN. CALL 911 IF CHEST IS NOT RESOLVED WITH 3 TABLETS 05/17/17   Antonieta IbaGollan, Timothy J, MD  nortriptyline (PAMELOR) 25 MG capsule Take 50 mg by mouth at bedtime.    [provider]  NOVOLOG FLEXPEN 100 UNIT/ML FlexPen Inject 12 Units into the skin 3 (three) times daily with meals. With every meal and every snack 12/13/15   [provider]  nystatin-triamcinolone (MYCOLOG II) cream Apply 1 application topically 2 (two) times daily.    [provider]  omega-3 acid ethyl esters (LOVAZA) 1 g capsule Take 1 g by mouth daily.    [provider]  omeprazole (PRILOSEC) 20 MG capsule Take 20 mg by mouth daily.    [provider]  ondansetron  (ZOFRAN-ODT) 4 MG disintegrating tablet Take 4 mg by mouth every 8 (eight) hours as needed for nausea or vomiting.    [provider]  Oxycodone HCl 10 MG TABS Take 10-15 mg by mouth every 4 (four) hours as needed (pain).    [provider]  potassium chloride SA (K-DUR,KLOR-CON) 20 MEQ tablet take 1 tablet by mouth once daily if needed Patient taking differently: take 1 tablet by mouth once daily 02/08/16   Antonieta IbaGollan, Timothy J, MD  prednisoLONE acetate (PRED FORTE) 1 % ophthalmic suspension Place 1 drop into the right eye daily.     [provider]  solifenacin (VESICARE) 10 MG tablet Take 10 mg by mouth daily.    [provider]  tiZANidine (ZANAFLEX) 4 MG capsule Take 4 mg by mouth 3 (three) times daily as needed for muscle spasms.    [provider]    Allergies Penicillins and Erythromycin base  Family History  Family history unknown: Yes    Social History Social History   Tobacco Use  . Smoking status: Current Every Day Smoker    Years: 0.00    Types: Pipe  . Smokeless tobacco: Never Used  . Tobacco comment: quit smoking friday 08/18/2016  Substance Use Topics  . Alcohol use: No    Alcohol/week: 0.0 oz    Comment: used to be moderate  . Drug use: No    Review of Systems  Constitutional: No fever/chills Eyes: No visual changes. ENT: No sore throat. Cardiovascular: Denies chest pain. Respiratory: Denies shortness of breath. Gastrointestinal: No abdominal pain.  No nausea, no vomiting.  No diarrhea.  No constipation. Genitourinary: Negative for dysuria. Musculoskeletal: Negative for back pain. Skin: Negative for rash. Neurological: Negative for headaches, focal weakness or numbness.  ____________________________________________   PHYSICAL EXAM:  VITAL SIGNS: ED Triage Vitals  Enc Vitals Group     BP 07/20/17 1620 126/77     Pulse Rate 07/20/17 1620 (!) 111  Resp 07/20/17 1620 18     Temp 07/20/17 1620 98.8 F (37.1  C)     Temp Source 07/20/17 1620 Oral     SpO2 07/20/17 1620 100 %     Weight 07/20/17 1613 170 lb (77.1 kg)     Height 07/20/17 1613 5\' 4"  (1.626 m)     Head Circumference --      Peak Flow --      Pain Score 07/20/17 1612 10     Pain Loc --      Pain Edu? --      Excl. in GC? --     Constitutional: Alert and oriented.  Patient moaning in pain in the hallway bed. Eyes: Conjunctivae are normal.  Head: Atraumatic. Nose: No congestion/rhinnorhea. Mouth/Throat: Mucous membranes are moist.  Neck: No stridor.   Cardiovascular: Normal rate, regular rhythm. Grossly normal heart sounds.   Respiratory: Normal respiratory effort.  No retraction. Lungs CTAB. Gastrointestinal: Soft and nontender. No distention.  Left lower quadrant ostomy site is nontender.  No exudate.  No dehiscence. Musculoskeletal:   Right knee without swelling or warmth.  No erythema.  Able to range the right knee with pain which is moderate.  Atrophy to the bilateral lower extremities.  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  Left shoulder without any swelling, deformity or erythema.  Able to range the left shoulder without issue but complaining of pain, subjectively.  Left radial pulses intact.  Neurologic:  Normal speech and language. No gross focal neurologic deficits are appreciated. Skin:  Skin is warm, dry and intact. No rash noted. Psychiatric: Mood and affect are normal. Speech and behavior are normal.  ____________________________________________   LABS (all labs ordered are listed, but only abnormal results are displayed)  Labs Reviewed - No data to display ____________________________________________  EKG   ____________________________________________  RADIOLOGY   ____________________________________________   PROCEDURES  Procedure(s) performed:   Procedures  Critical Care performed:   ____________________________________________   INITIAL  IMPRESSION / ASSESSMENT AND PLAN / ED COURSE  Pertinent labs & imaging results that were available during my care of the patient were reviewed by me and considered in my medical decision making (see chart for details).  DDX: Chronic pain, arthritis, osteoarthritis, septic arthritis, myalgia As part of my medical decision making, I reviewed the following data within the electronic MEDICAL RECORD NUMBER Notes from prior ED visits  ----------------------------------------- 6:30 PM on 07/20/2017 -----------------------------------------  Patient at this time with decreased pain.  Pain appears chronic.  Heart rate of 110 but the patient says that this is normal for him.  Checked his PMP aware, [place] opioid database and the last prescription yet for any controlled substance was this past March.  He will be given a short course of oxycodone but must follow-up with his pain control management.  He is understanding of this plan willing to comply.  Likely exacerbation of chronic pain secondary to noncompliance with steroid injections. ____________________________________________   FINAL CLINICAL IMPRESSION(S) / ED DIAGNOSES  Joint pain, chronic    NEW MEDICATIONS STARTED DURING THIS VISIT:  New Prescriptions   No medications on file     Note:  This document was prepared using Dragon voice recognition software and may include unintentional dictation errors.     Myrna Blazer, MD 07/20/17 1830    Pershing Proud Myra Rude, MD 07/20/17 [id number]

## 2017-07-20 NOTE — ED Notes (Signed)
EMS called for patient to be taken home. Paper copy of E-sig obtained and patient given his D/C paperwork. Pt states understanding at this time.

## 2017-07-20 NOTE — ED Notes (Signed)
[facility]  CALLED  FOR  TRANSPORT  BACK  HOME 

## 2017-07-20 NOTE — ED Triage Notes (Signed)
Pt comes via [facility] with c/o of right leg and left shoulder pain. Pt is from home. Pt was suppose to receive a steroid shot, but missed it due to being admitted in hospital at the time.

## 2017-07-20 NOTE — ED Notes (Signed)
Pt states pain to R knee and L knee, pt states was supposed to have a steroid shot a month ago. But did not have it because he was in the hospital having an ostomy reversal performed. Pt denies abdominal issues or issues regarding is colostomy or reversal. Pt is alert and oriented at this time. Pt states pain 10/10, worse with movement. States pain to R knee and L shoulder.

## 2017-10-19 ENCOUNTER — Emergency Department
Admission: EM | Admit: 2017-10-19 | Discharge: 2017-10-19 | Disposition: A | Discharge status: Home / Self Care | Payer: Medicaid Other | Attending: Emergency Medicine | Admitting: Emergency Medicine

## 2017-10-19 ENCOUNTER — Other Ambulatory Visit: Payer: Self-pay

## 2017-10-19 DIAGNOSIS — E119 Type 2 diabetes mellitus without complications: Secondary | ICD-10-CM | POA: Insufficient documentation

## 2017-10-19 DIAGNOSIS — Z79899 Other long term (current) drug therapy: Secondary | ICD-10-CM | POA: Insufficient documentation

## 2017-10-19 DIAGNOSIS — F419 Anxiety disorder, unspecified: Secondary | ICD-10-CM | POA: Insufficient documentation

## 2017-10-19 DIAGNOSIS — J45909 Unspecified asthma, uncomplicated: Secondary | ICD-10-CM | POA: Diagnosis not present

## 2017-10-19 DIAGNOSIS — Z794 Long term (current) use of insulin: Secondary | ICD-10-CM | POA: Diagnosis not present

## 2017-10-19 DIAGNOSIS — Z7982 Long term (current) use of aspirin: Secondary | ICD-10-CM | POA: Diagnosis not present

## 2017-10-19 DIAGNOSIS — F17290 Nicotine dependence, other tobacco product, uncomplicated: Secondary | ICD-10-CM | POA: Diagnosis not present

## 2017-10-19 DIAGNOSIS — Z8673 Personal history of transient ischemic attack (TIA), and cerebral infarction without residual deficits: Secondary | ICD-10-CM | POA: Insufficient documentation

## 2017-10-19 DIAGNOSIS — I10 Essential (primary) hypertension: Secondary | ICD-10-CM | POA: Diagnosis not present

## 2017-10-19 DIAGNOSIS — A46 Erysipelas: Secondary | ICD-10-CM | POA: Insufficient documentation

## 2017-10-19 DIAGNOSIS — H9201 Otalgia, right ear: Secondary | ICD-10-CM | POA: Diagnosis present

## 2017-10-19 DIAGNOSIS — Z21 Asymptomatic human immunodeficiency virus [HIV] infection status: Secondary | ICD-10-CM | POA: Diagnosis not present

## 2017-10-19 DIAGNOSIS — F329 Major depressive disorder, single episode, unspecified: Secondary | ICD-10-CM | POA: Insufficient documentation

## 2017-10-19 MED ORDER — CLINDAMYCIN PHOSPHATE 300 MG/2ML IJ SOLN
600.0000 mg | Freq: Once | INTRAMUSCULAR | Status: AC
  Administered 2017-10-19: 600 mg via INTRAMUSCULAR
  Filled 2017-10-19: qty 4

## 2017-10-19 MED ORDER — CLINDAMYCIN HCL 300 MG PO CAPS: 300.0000 mg | ORAL_CAPSULE | Freq: Three times a day (TID) | ORAL | 0 refills | Status: AC

## 2017-10-19 MED ORDER — ACETAMINOPHEN 325 MG PO TABS
650.0000 mg | ORAL_TABLET | Freq: Once | ORAL | Status: AC
  Administered 2017-10-19: 650 mg via ORAL
  Filled 2017-10-19: qty 2

## 2017-10-19 NOTE — ED Provider Notes (Signed)
[facility] Emergency Department Provider Note  ____________________________________________  Time seen: Approximately 5:04 PM  I have reviewed the triage vital signs and the nursing notes.   HISTORY  Chief Complaint Otalgia    HPI Cory Campbell is a 37 y.o. male presents to the emergency department with 8/10 right external ear pain and swelling for the past two days.  Patient reports that he has noticed a small scab at right helix but is unsure if he was bitten.  Patient denies pain with palpation of the tragus and has had no discharge from the ear.  He does report that his hearing seems somewhat muffled.  He denies fever and chills.  No alleviating measures have been attempted.   Past Medical History:  Diagnosis Date  . Abrasion or friction burn of foot and toe(s), without mention of infection   . Acid reflux   . Adopted   . Allergic rhinitis, cause unspecified   . Anxiety   . Arthritis   . Arthrogryposis   . Asthma   . Bladder wall thickening   . BPH (benign prostatic hyperplasia)   . Chronic pain syndrome   . Depressive disorder, not elsewhere classified   . Diabetes mellitus without complication (HCC)   . Diverticulitis of colon (without mention of hemorrhage)(562.11)   . ED (erectile dysfunction)   . Herpes genitalis   . History of echocardiogram    a. 10/2016: EF 50-55%, normal wall motion  . History of stress test    a. 06/2011: significant GI uptake artifact, no evidence of ischemia, EF 56%  . HIV infection (HCC)   . HTN (hypertension)   . Hyperlipidemia   . Hypogonadism in male   . Myalgia and myositis, unspecified   . OAB (overactive bladder)   . Obesity   . Other psoriasis   . Premature baby    born premature  . Seizure disorder (HCC)   . Sleep apnea   . 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 or other cause, without mention of thyrotoxic crisis or storm    hyperthyroidism  . TIA (transient ischemic attack)   . Type II or  unspecified type diabetes mellitus with neurological manifestations, not stated as uncontrolled(250.60)     Patient Active Problem List   Diagnosis Date Noted  . Surgical wound, non healing   . Acute colitis 05/20/2017  . Colitis   . Acute renal failure (HCC) 05/15/2017  . Diarrhea 05/15/2017  . Sepsis (HCC) 05/15/2017  . Abdominal pain 11/25/2016  . Hypotension 10/08/2016  . Unstable angina (HCC) 10/06/2016  . Shortness of breath 06/23/2015  . Obesity 05/18/2014  . Frequent falls 08/12/2013  . Pain of left clavicle 08/12/2013  . Tachycardia 01/21/2013  . Obstructive sleep apnea on CPAP 06/07/2012  . Chest pain 06/20/2011  . Uncontrolled hypertension 06/20/2011  . Diabetes mellitus (HCC) 06/20/2011    Past Surgical History:  Procedure Laterality Date  . APPENDECTOMY    . COLOSTOMY    . CORNEAL TRANSPLANT    . feet surgery     both  . HAND SURGERY     left and right  . leg surgery     left and right  . ORTHOPEDIC SURGERY     hands, feet, knees, legs  . tubes in ears     both    Prior to Admission medications   Medication Sig Start Date End Date Taking? Authorizing Provider  acyclovir (ZOVIRAX) 400 MG tablet Take 400 mg by mouth 2 (two)  times daily.    [provider]  albuterol (PROVENTIL HFA;VENTOLIN HFA) 108 (90 BASE) MCG/ACT inhaler Inhale 2 puffs into the lungs every 4 (four) hours as needed for wheezing or shortness of breath.     [provider]  albuterol (PROVENTIL) (2.5 MG/3ML) 0.083% nebulizer solution Take 2.5 mg by nebulization every 4 (four) hours as needed for wheezing or shortness of breath.    [provider]  alfuzosin (UROXATRAL) 10 MG 24 hr tablet Take 10 mg by mouth daily.    [provider]  aspirin 81 MG tablet Take 81 mg by mouth daily.    [provider]  BIKTARVY 50-200-25 MG TABS tablet Take 1 tablet by mouth daily. 11/21/16   [provider]  Buprenorphine (BUTRANS) 15 MCG/HR PTWK Place  15 mg onto the skin once a week. Saturday    [provider]  buPROPion (WELLBUTRIN XL) 150 MG 24 hr tablet Take 1 tablet by mouth daily. 11/16/16   [provider]  citalopram (CELEXA) 20 MG tablet Take 20 mg by mouth daily.    [provider]  clindamycin (CLEOCIN) 300 MG capsule Take 1 capsule (300 mg total) by mouth 3 (three) times daily for 10 days. 10/19/17 10/29/17  Orvil FeilWoods, Monic Engelmann M, PA-C  diclofenac sodium (VOLTAREN) 1 % GEL Apply 2-4 g topically 4 (four) times daily as needed. For pain.    [provider]  dicyclomine (BENTYL) 20 MG tablet Take 20 mg by mouth every 6 (six) hours.    [provider]  famotidine (PEPCID) 20 MG tablet Take 20 mg by mouth daily.  01/03/16   [provider]  feeding supplement, GLUCERNA SHAKE, (GLUCERNA SHAKE) LIQD Take 237 mLs by mouth 4 (four) times daily.    [provider]  finasteride (PROSCAR) 5 MG tablet Take 5 mg by mouth daily.    [provider]  fluticasone (FLOVENT HFA) 110 MCG/ACT inhaler Inhale 2 puffs into the lungs 2 (two) times daily.    [provider]  gabapentin (NEURONTIN) 300 MG capsule Take 300 mg by mouth at bedtime.    [provider]  HYDROcodone-acetaminophen (NORCO) 5-325 MG tablet Take 1 tablet by mouth every 4 (four) hours as needed for moderate pain. 05/24/17   Arnaldo NatalMalinda, Paul F, MD  hydrocortisone 2.5 % cream Apply 1 application topically 2 (two) times daily as needed for itching.     [provider]  insulin degludec (TRESIBA FLEXTOUCH) 100 UNIT/ML SOPN FlexTouch Pen Inject 80 Units into the skin at bedtime.     [provider]  ketoconazole (NIZORAL) 2 % shampoo Apply 1 application topically 2 (two) times a week. tues and thursday 08/27/14   [provider]  loratadine (CLARITIN) 10 MG tablet Take 10 mg by mouth daily.    [provider]  magnesium oxide (MAG-OX) 400 (241.3 Mg) MG tablet Take 1 tablet (400 mg  total) by mouth daily. 05/23/17   Salary, Jetty DuhamelMontell D, MD  Melatonin 5 MG TABS Take 10 mg by mouth at bedtime as needed (sleep).    [provider]  metFORMIN (GLUCOPHAGE-XR) 750 MG 24 hr tablet Take 750 mg by mouth 2 (two) times daily.    [provider]  metoprolol tartrate (LOPRESSOR) 100 MG tablet Take 150 mg by mouth 2 (two) times daily.     [provider]  metroNIDAZOLE (FLAGYL) 500 MG tablet Take 1 tablet (500 mg total) by mouth every 8 (eight) hours. 05/22/17   Salary,  Montell D, MD  miconazole (MICOTIN) 2 % cream Apply 1 application topically 2 (two) times daily.    [provider]  mirtazapine (REMERON) 45 MG tablet Take 45 mg by mouth at bedtime.    [provider]  Multiple Vitamin (MULTIVITAMIN) tablet Take 1 tablet by mouth daily.    [provider]  naloxone [facility]) nasal spray 4 mg/0.1 mL One spray in either nostril once for known/suspected opioid overdose. May repeat every 2-3 minutes in alternating nostril til EMS arrives 01/12/16   [provider]  nicotine (NICODERM CQ - DOSED IN MG/24 HOURS) 14 mg/24hr patch Place 14 mg onto the skin daily.    [provider]  nicotine polacrilex (COMMIT) 4 MG lozenge Take 4 mg by mouth every 2 (two) hours as needed for smoking cessation.    [provider]  nitroGLYCERIN (NITROSTAT) 0.4 MG SL tablet DISSOLVE 1 TABLET UNDER THE TONGUE EVERY 5 MINUTES AS NEEDED FOR CHEST PAIN. CALL 911 IF CHEST IS NOT RESOLVED WITH 3 TABLETS 05/17/17   Antonieta Iba, MD  nortriptyline (PAMELOR) 25 MG capsule Take 50 mg by mouth at bedtime.    [provider]  NOVOLOG FLEXPEN 100 UNIT/ML FlexPen Inject 12 Units into the skin 3 (three) times daily with meals. With every meal and every snack 12/13/15   [provider]  nystatin-triamcinolone (MYCOLOG II) cream Apply 1 application topically 2 (two) times daily.    [provider]  omega-3 acid ethyl esters  (LOVAZA) 1 g capsule Take 1 g by mouth daily.    [provider]  omeprazole (PRILOSEC) 20 MG capsule Take 20 mg by mouth daily.    [provider]  ondansetron (ZOFRAN-ODT) 4 MG disintegrating tablet Take 4 mg by mouth every 8 (eight) hours as needed for nausea or vomiting.    [provider]  oxyCODONE (ROXICODONE) 5 MG immediate release tablet Take 1 tablet (5 mg total) by mouth every 4 (four) hours as needed for moderate pain or severe pain. 07/20/17 07/20/18  Schaevitz, Myra Rude, MD  potassium chloride SA (K-DUR,KLOR-CON) 20 MEQ tablet take 1 tablet by mouth once daily if needed Patient taking differently: take 1 tablet by mouth once daily 02/08/16   Antonieta Iba, MD  prednisoLONE acetate (PRED FORTE) 1 % ophthalmic suspension Place 1 drop into the right eye daily.     [provider]  solifenacin (VESICARE) 10 MG tablet Take 10 mg by mouth daily.    [provider]  tiZANidine (ZANAFLEX) 4 MG capsule Take 4 mg by mouth 3 (three) times daily as needed for muscle spasms.    [provider]    Allergies Penicillins and Erythromycin base  Family History  Family history unknown: Yes    Social History Social History   Tobacco Use  . Smoking status: Current Every Day Smoker    Years: 0.00    Types: Pipe  . Smokeless tobacco: Never Used  . Tobacco comment: quit smoking friday 08/18/2016  Substance Use Topics  . Alcohol use: No    Alcohol/week: 0.0 standard drinks    Comment: used to be moderate  . Drug use: No     Review of Systems  Constitutional: No fever/chills Eyes: No visual changes. No discharge ENT: Patient has edema and erythema of right external ear. Cardiovascular: no chest pain. Respiratory: no cough. No SOB. Gastrointestinal: No abdominal pain.  No nausea, no vomiting.  No diarrhea.  No constipation. Genitourinary: Negative for dysuria.  No hematuria Musculoskeletal: Negative for musculoskeletal  pain. Skin: Negative for rash, abrasions, lacerations, ecchymosis. Neurological: Negative for headaches, focal weakness or numbness.   ____________________________________________   PHYSICAL EXAM:  VITAL SIGNS: ED Triage Vitals  Enc Vitals Group     BP 10/19/17 1643 (!) 145/108     Pulse Rate 10/19/17 1643 84     Resp --      Temp 10/19/17 1643 98.6 F (37 C)     Temp Source 10/19/17 1643 Oral     SpO2 10/19/17 1636 98 %     Weight 10/19/17 1639 189 lb (85.7 kg)     Height 10/19/17 1639 5\' 6"  (1.676 m)     Head Circumference --      Peak Flow --      Pain Score 10/19/17 1639 9     Pain Loc --      Pain Edu? --      Excl. in GC? --      Constitutional: Alert and oriented. Well appearing and in no acute distress. Eyes: Conjunctivae are normal. PERRL. EOMI. Head: Atraumatic. ENT:      Ears: Right ear: Patient has edema and erythema from helix to preauricular skin.  Small region of scab formation visualized at antihelix.  No apparent edema of the external auditory canal.  Right TM is pearly.      Nose: No congestion/rhinnorhea.      Mouth/Throat: Mucous membranes are moist.  Neck: No stridor.  No cervical spine tenderness to palpation. Cardiovascular: Normal rate, regular rhythm. Normal S1 and S2.  Good peripheral circulation. Respiratory: Normal respiratory effort without tachypnea or retractions. Lungs CTAB. Good air entry to the bases with no decreased or absent breath sounds. ____________________________________________   LABS (all labs ordered are listed, but only abnormal results are displayed)  Labs Reviewed - No data to display ____________________________________________  EKG   ____________________________________________  RADIOLOGY   No results found.  ____________________________________________    PROCEDURES  Procedure(s) performed:    Procedures    Medications  clindamycin (CLEOCIN) injection 600 mg (has no administration in time  range)  acetaminophen (TYLENOL) tablet 650 mg (has no administration in time range)     ____________________________________________   INITIAL IMPRESSION / ASSESSMENT AND PLAN / ED COURSE  Pertinent labs & imaging results that were available during my care of the patient were reviewed by me and considered in my medical decision making (see chart for details).  Review of the [place] CSRS was performed in accordance of the [facility] prior to dispensing any controlled drugs.      Assessment and plan Erysipelas Patient presents to the emergency department with right ear pain.  There is a small region of scab formation which has seeded erysipelas.  Patient was given clindamycin in the emergency department.  Patient was discharged with clindamycin.  Patient had no reproducible pain with palpation of the tragus or with manipulation of the external auditory canal, decreasing suspicion for otitis externa.  No bulging right TM or erythema suggestive of otitis media.  Patient was advised to follow-up with primary care as needed.  All patient questions were answered.    ____________________________________________  FINAL CLINICAL IMPRESSION(S) / ED DIAGNOSES  Final diagnoses:  Erysipelas      NEW MEDICATIONS STARTED DURING THIS VISIT:  ED Discharge Orders         Ordered    clindamycin (CLEOCIN) 300 MG capsule  3 times daily     10/19/17 1658  This chart was dictated using voice recognition software/Dragon. Despite best efforts to proofread, errors can occur which can change the meaning. Any change was purely unintentional.    Orvil Feil, PA-C 10/19/17 1724    Sharyn Creamer, MD 10/19/17 [phone]

## 2017-10-19 NOTE — ED Triage Notes (Signed)
Pt arrives via ems from home with report of right ear pain starting yesterday around 3pm. Pt states pain has been increasing since it started with a decrease in hearing with right ear. Pt report recent head cold with nasal congestion. NAD noted at this time

## 2017-12-17 ENCOUNTER — Other Ambulatory Visit: Payer: Self-pay | Admitting: Cardiovascular Disease

## 2018-01-17 ENCOUNTER — Emergency Department
Admission: EM | Admit: 2018-01-17 | Discharge: 2018-01-17 | Disposition: A | Discharge status: Home / Self Care | Payer: Medicaid Other | Attending: Emergency Medicine | Admitting: Emergency Medicine

## 2018-01-17 ENCOUNTER — Other Ambulatory Visit: Payer: Self-pay

## 2018-01-17 ENCOUNTER — Emergency Department: Payer: Medicaid Other

## 2018-01-17 ENCOUNTER — Encounter: Payer: Self-pay | Admitting: Emergency Medicine

## 2018-01-17 DIAGNOSIS — I10 Essential (primary) hypertension: Secondary | ICD-10-CM | POA: Diagnosis not present

## 2018-01-17 DIAGNOSIS — E119 Type 2 diabetes mellitus without complications: Secondary | ICD-10-CM | POA: Diagnosis not present

## 2018-01-17 DIAGNOSIS — F17290 Nicotine dependence, other tobacco product, uncomplicated: Secondary | ICD-10-CM | POA: Diagnosis not present

## 2018-01-17 DIAGNOSIS — Z7982 Long term (current) use of aspirin: Secondary | ICD-10-CM | POA: Insufficient documentation

## 2018-01-17 DIAGNOSIS — K529 Noninfective gastroenteritis and colitis, unspecified: Secondary | ICD-10-CM | POA: Insufficient documentation

## 2018-01-17 DIAGNOSIS — Z794 Long term (current) use of insulin: Secondary | ICD-10-CM | POA: Insufficient documentation

## 2018-01-17 DIAGNOSIS — R197 Diarrhea, unspecified: Secondary | ICD-10-CM | POA: Diagnosis present

## 2018-01-17 DIAGNOSIS — Z79899 Other long term (current) drug therapy: Secondary | ICD-10-CM | POA: Insufficient documentation

## 2018-01-17 LAB — COMPREHENSIVE METABOLIC PANEL
ALBUMIN: 3.5 g/dL (ref 3.5–5.0)
ALT: 19 U/L (ref 0–44)
ANION GAP: 9 (ref 5–15)
AST: 19 U/L (ref 15–41)
Alkaline Phosphatase: 81 U/L (ref 38–126)
BUN: 19 mg/dL (ref 6–20)
CHLORIDE: 99 mmol/L (ref 98–111)
CO2: 29 mmol/L (ref 22–32)
Calcium: 9.2 mg/dL (ref 8.9–10.3)
Creatinine, Ser: 1.07 mg/dL (ref 0.61–1.24)
Glucose, Bld: 170 mg/dL — ABNORMAL HIGH (ref 70–99)
POTASSIUM: 3.3 mmol/L — AB (ref 3.5–5.1)
SODIUM: 137 mmol/L (ref 135–145)
TOTAL PROTEIN: 6.6 g/dL (ref 6.5–8.1)
Total Bilirubin: 0.6 mg/dL (ref 0.3–1.2)

## 2018-01-17 LAB — CBC
HCT: 45.3 % (ref 39.0–52.0)
Hemoglobin: 14.7 g/dL (ref 13.0–17.0)
MCH: 25.7 pg — ABNORMAL LOW (ref 26.0–34.0)
MCHC: 32.5 g/dL (ref 30.0–36.0)
MCV: 79.2 fL — AB (ref 80.0–100.0)
NRBC: 0 % (ref 0.0–0.2)
Platelets: 266 10*3/uL (ref 150–400)
RBC: 5.72 MIL/uL (ref 4.22–5.81)
RDW: 20.3 % — ABNORMAL HIGH (ref 11.5–15.5)
WBC: 8.5 10*3/uL (ref 4.0–10.5)

## 2018-01-17 LAB — GASTROINTESTINAL PANEL BY PCR, STOOL (REPLACES STOOL CULTURE)
ADENOVIRUS F40/41: NOT DETECTED
Astrovirus: NOT DETECTED
CRYPTOSPORIDIUM: NOT DETECTED
CYCLOSPORA CAYETANENSIS: NOT DETECTED
Campylobacter species: NOT DETECTED
ENTEROTOXIGENIC E COLI (ETEC): NOT DETECTED
Entamoeba histolytica: NOT DETECTED
Enteroaggregative E coli (EAEC): NOT DETECTED
Enteropathogenic E coli (EPEC): NOT DETECTED
Giardia lamblia: NOT DETECTED
Norovirus GI/GII: NOT DETECTED
PLESIMONAS SHIGELLOIDES: NOT DETECTED
ROTAVIRUS A: NOT DETECTED
SHIGA LIKE TOXIN PRODUCING E COLI (STEC): NOT DETECTED
SHIGELLA/ENTEROINVASIVE E COLI (EIEC): NOT DETECTED
Salmonella species: NOT DETECTED
Sapovirus (I, II, IV, and V): NOT DETECTED
Vibrio cholerae: NOT DETECTED
Vibrio species: NOT DETECTED
Yersinia enterocolitica: NOT DETECTED

## 2018-01-17 LAB — C DIFFICILE QUICK SCREEN W PCR REFLEX
C DIFFICILE (CDIFF) INTERP: NOT DETECTED
C DIFFICILE (CDIFF) TOXIN: NEGATIVE
C DIFFICLE (CDIFF) ANTIGEN: NEGATIVE

## 2018-01-17 LAB — LIPASE, BLOOD: Lipase: 32 U/L (ref 11–51)

## 2018-01-17 MED ORDER — MORPHINE SULFATE (PF) 4 MG/ML IV SOLN
4.0000 mg | Freq: Once | INTRAVENOUS | Status: AC
  Administered 2018-01-17: 4 mg via INTRAVENOUS
  Filled 2018-01-17: qty 1

## 2018-01-17 MED ORDER — IOPAMIDOL (ISOVUE-300) INJECTION 61%
100.0000 mL | Freq: Once | INTRAVENOUS | Status: AC | PRN
  Administered 2018-01-17: 100 mL via INTRAVENOUS

## 2018-01-17 MED ORDER — SODIUM CHLORIDE 0.9 % IV BOLUS
1000.0000 mL | Freq: Once | INTRAVENOUS | Status: AC
  Administered 2018-01-17: 1000 mL via INTRAVENOUS

## 2018-01-17 MED ORDER — ONDANSETRON HCL 4 MG/2ML IJ SOLN
4.0000 mg | Freq: Once | INTRAMUSCULAR | Status: AC
  Administered 2018-01-17: 4 mg via INTRAVENOUS
  Filled 2018-01-17: qty 2

## 2018-01-17 MED ORDER — LOPERAMIDE HCL 2 MG PO CAPS
4.0000 mg | ORAL_CAPSULE | Freq: Once | ORAL | Status: AC
  Administered 2018-01-17: 4 mg via ORAL
  Filled 2018-01-17: qty 2

## 2018-01-17 NOTE — ED Notes (Signed)
Per Dr. Alphonzo LemmingsMcshane D/C without waiting for GI PCR.

## 2018-01-17 NOTE — ED Triage Notes (Signed)
Pt arrived to the ED via EMS from home for complaints of diarrhea and abdominal pain for the last 2 days. Pt is paraplegic and states that he had many abdominal surgeries in the past and also had C-Diff in the past. Pt is AOx4 in no apparent distress.

## 2018-01-17 NOTE — ED Notes (Addendum)
EMS arrived to transport pt back home, pt given belongings and has cell phone and charger as well as DC paperwork. Denies any further needs at this time.  Pt instructed to take BP meds when he gets home. Pt verbalized understanding of this.

## 2018-01-17 NOTE — ED Provider Notes (Signed)
[facility] Emergency Department Provider Note _______________   First MD Initiated Contact with Patient 01/17/18 [phone]     (approximate)  I have reviewed the triage vital signs and the nursing notes.   HISTORY  Chief Complaint Diarrhea and Abdominal Pain    HPI Cory Campbell is a 37 y.o. male with below list of chronic medical conditions presents to the emergency department with 2-day history of generalized abdominal discomfort with associated diarrhea that is nonbloody in nature.  Patient denies any fever.  Patient denies any vomiting or urinary symptoms.  Patient states current pain is 10 out of 10.   Past Medical History:  Diagnosis Date  . Abrasion or friction burn of foot and toe(s), without mention of infection   . Acid reflux   . Adopted   . Allergic rhinitis, cause unspecified   . Anxiety   . Arthritis   . Arthrogryposis   . Asthma   . Bladder wall thickening   . BPH (benign prostatic hyperplasia)   . Chronic pain syndrome   . Depressive disorder, not elsewhere classified   . Diabetes mellitus without complication (HCC)   . Diverticulitis of colon (without mention of hemorrhage)(562.11)   . ED (erectile dysfunction)   . Herpes genitalis   . History of echocardiogram    a. 10/2016: EF 50-55%, normal wall motion  . History of stress test    a. 06/2011: significant GI uptake artifact, no evidence of ischemia, EF 56%  . HIV infection (HCC)   . HTN (hypertension)   . Hyperlipidemia   . Hypogonadism in male   . Myalgia and myositis, unspecified   . OAB (overactive bladder)   . Obesity   . Other psoriasis   . Premature baby    born premature  . Seizure disorder (HCC)   . Sleep apnea   . 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 or other cause, without mention of thyrotoxic crisis or storm    hyperthyroidism  . TIA (transient ischemic attack)   . Type II or unspecified type diabetes mellitus with neurological manifestations, not  stated as uncontrolled(250.60)     Patient Active Problem List   Diagnosis Date Noted  . Surgical wound, non healing   . Acute colitis 05/20/2017  . Colitis   . Acute renal failure (HCC) 05/15/2017  . Diarrhea 05/15/2017  . Sepsis (HCC) 05/15/2017  . Abdominal pain 11/25/2016  . Hypotension 10/08/2016  . Unstable angina (HCC) 10/06/2016  . Shortness of breath 06/23/2015  . Obesity 05/18/2014  . Frequent falls 08/12/2013  . Pain of left clavicle 08/12/2013  . Tachycardia 01/21/2013  . Obstructive sleep apnea on CPAP 06/07/2012  . Chest pain 06/20/2011  . Uncontrolled hypertension 06/20/2011  . Diabetes mellitus (HCC) 06/20/2011    Past Surgical History:  Procedure Laterality Date  . APPENDECTOMY    . COLOSTOMY    . CORNEAL TRANSPLANT    . feet surgery     both  . HAND SURGERY     left and right  . leg surgery     left and right  . ORTHOPEDIC SURGERY     hands, feet, knees, legs  . tubes in ears     both    Prior to Admission medications   Medication Sig Start Date End Date Taking? Authorizing Provider  acyclovir (ZOVIRAX) 400 MG tablet Take 400 mg by mouth 2 (two) times daily.    [provider]  albuterol (PROVENTIL HFA;VENTOLIN HFA) 108 (90 BASE)  MCG/ACT inhaler Inhale 2 puffs into the lungs every 4 (four) hours as needed for wheezing or shortness of breath.     [provider]  albuterol (PROVENTIL) (2.5 MG/3ML) 0.083% nebulizer solution Take 2.5 mg by nebulization every 4 (four) hours as needed for wheezing or shortness of breath.    [provider]  alfuzosin (UROXATRAL) 10 MG 24 hr tablet Take 10 mg by mouth daily.    [provider]  aspirin 81 MG tablet Take 81 mg by mouth daily.    [provider]  BIKTARVY 50-200-25 MG TABS tablet Take 1 tablet by mouth daily. 11/21/16   [provider]  Buprenorphine (BUTRANS) 15 MCG/HR PTWK Place 15 mg onto the skin once a week. Saturday    [provider]    buPROPion (WELLBUTRIN XL) 150 MG 24 hr tablet Take 1 tablet by mouth daily. 11/16/16   [provider]  citalopram (CELEXA) 20 MG tablet Take 20 mg by mouth daily.    [provider]  diclofenac sodium (VOLTAREN) 1 % GEL Apply 2-4 g topically 4 (four) times daily as needed. For pain.    [provider]  dicyclomine (BENTYL) 20 MG tablet Take 20 mg by mouth every 6 (six) hours.    [provider]  famotidine (PEPCID) 20 MG tablet Take 20 mg by mouth daily.  01/03/16   [provider]  feeding supplement, GLUCERNA SHAKE, (GLUCERNA SHAKE) LIQD Take 237 mLs by mouth 4 (four) times daily.    [provider]  finasteride (PROSCAR) 5 MG tablet Take 5 mg by mouth daily.    [provider]  fluticasone (FLOVENT HFA) 110 MCG/ACT inhaler Inhale 2 puffs into the lungs 2 (two) times daily.    [provider]  gabapentin (NEURONTIN) 300 MG capsule Take 300 mg by mouth at bedtime.    [provider]  HYDROcodone-acetaminophen (NORCO) 5-325 MG tablet Take 1 tablet by mouth every 4 (four) hours as needed for moderate pain. 05/24/17   Arnaldo Natal, MD  hydrocortisone 2.5 % cream Apply 1 application topically 2 (two) times daily as needed for itching.     [provider]  insulin degludec (TRESIBA FLEXTOUCH) 100 UNIT/ML SOPN FlexTouch Pen Inject 80 Units into the skin at bedtime.     [provider]  ketoconazole (NIZORAL) 2 % shampoo Apply 1 application topically 2 (two) times a week. tues and thursday 08/27/14   [provider]  loratadine (CLARITIN) 10 MG tablet Take 10 mg by mouth daily.    [provider]  magnesium oxide (MAG-OX) 400 (241.3 Mg) MG tablet Take 1 tablet (400 mg total) by mouth daily. 05/23/17   Salary, Jetty Duhamel D, MD  Melatonin 5 MG TABS Take 10 mg by mouth at bedtime as needed (sleep).    [provider]  metFORMIN (GLUCOPHAGE-XR) 750 MG 24 hr tablet Take 750 mg by mouth 2  (two) times daily.    [provider]  metoprolol tartrate (LOPRESSOR) 100 MG tablet Take 150 mg by mouth 2 (two) times daily.     [provider]  metroNIDAZOLE (FLAGYL) 500 MG tablet Take 1 tablet (500 mg total) by mouth every 8 (eight) hours. 05/22/17   Salary, Evelena Asa, MD  miconazole (MICOTIN) 2 % cream Apply 1 application topically 2 (two) times daily.    [provider]  mirtazapine (REMERON) 45 MG tablet Take 45 mg by mouth at bedtime.    [provider]  Multiple Vitamin (MULTIVITAMIN) tablet Take 1 tablet by mouth daily.    [provider]  naloxone [facility]) nasal spray 4 mg/0.1 mL One spray in either nostril once for known/suspected opioid overdose. May repeat every 2-3 minutes in alternating nostril til EMS arrives 01/12/16   [provider]  nicotine (NICODERM CQ - DOSED IN MG/24 HOURS) 14 mg/24hr patch Place 14 mg onto the skin daily.    [provider]  nicotine polacrilex (COMMIT) 4 MG lozenge Take 4 mg by mouth every 2 (two) hours as needed for smoking cessation.    [provider]  nitroGLYCERIN (NITROSTAT) 0.4 MG SL tablet DISSOLVE 1 TABLET UNDER THE TONGUE EVERY 5 MINUTES AS NEEDED FOR CHEST PAIN. CALL 911 IF CHEST IS NOT RESOLVED WITH 3 TABLETS 12/17/17   Antonieta Iba, MD  nortriptyline (PAMELOR) 25 MG capsule Take 50 mg by mouth at bedtime.    [provider]  NOVOLOG FLEXPEN 100 UNIT/ML FlexPen Inject 12 Units into the skin 3 (three) times daily with meals. With every meal and every snack 12/13/15   [provider]  nystatin-triamcinolone (MYCOLOG II) cream Apply 1 application topically 2 (two) times daily.    [provider]  omega-3 acid ethyl esters (LOVAZA) 1 g capsule Take 1 g by mouth daily.    [provider]  omeprazole (PRILOSEC) 20 MG capsule Take 20 mg by mouth daily.    [provider]  ondansetron (ZOFRAN-ODT) 4 MG disintegrating tablet Take 4 mg  by mouth every 8 (eight) hours as needed for nausea or vomiting.    [provider]  oxyCODONE (ROXICODONE) 5 MG immediate release tablet Take 1 tablet (5 mg total) by mouth every 4 (four) hours as needed for moderate pain or severe pain. 07/20/17 07/20/18  Schaevitz, Myra Rude, MD  potassium chloride SA (K-DUR,KLOR-CON) 20 MEQ tablet take 1 tablet by mouth once daily if needed Patient taking differently: take 1 tablet by mouth once daily 02/08/16   Antonieta Iba, MD  prednisoLONE acetate (PRED FORTE) 1 % ophthalmic suspension Place 1 drop into the right eye daily.     [provider]  solifenacin (VESICARE) 10 MG tablet Take 10 mg by mouth daily.    [provider]  tiZANidine (ZANAFLEX) 4 MG capsule Take 4 mg by mouth 3 (three) times daily as needed for muscle spasms.    [provider]    Allergies Penicillins and Erythromycin base  Family History  Family history unknown: Yes    Social History Social History   Tobacco Use  . Smoking status: Current Every Day Smoker    Years: 0.00    Types: Pipe  . Smokeless tobacco: Never Used  . Tobacco comment: quit smoking friday 08/18/2016  Substance Use Topics  . Alcohol use: No    Alcohol/week: 0.0 standard drinks    Comment: used to be moderate  . Drug use: No    Review of Systems Constitutional: No fever/chills Eyes: No visual changes. ENT: No sore throat. Cardiovascular: Denies chest pain. Respiratory: Denies shortness of breath. Gastrointestinal: Positive for abdominal pain and diarrhea.  No nausea, no vomiting.  . Genitourinary: Negative for dysuria. Musculoskeletal: Negative for neck pain.  Negative for back pain. Integumentary: Negative for rash. Neurological: Negative for headaches, focal weakness or numbness.   ____________________________________________   PHYSICAL EXAM:  VITAL SIGNS: ED Triage Vitals  Enc Vitals Group     BP 01/17/18 0312 (!) 125/95     Pulse Rate  01/17/18 0312 97     Resp 01/17/18 0312 18     Temp 01/17/18 0312 98.2 F (36.8 C)     Temp Source 01/17/18 0312 Oral     SpO2 01/17/18 0312 97 %     Weight 01/17/18 0313 88.5 kg (195 lb)     Height 01/17/18 0313 1.626 m (5\' 4" )     Head Circumference --      Peak Flow --      Pain Score 01/17/18 0313 10     Pain Loc --      Pain Edu? --      Excl. in GC? --    Constitutional: Alert and oriented. Well appearing and in no acute distress. Eyes: Conjunctivae are normal. Mouth/Throat: Mucous membranes are moist.  Oropharynx non-erythematous. Neck: No stridor.   Cardiovascular: Normal rate, regular rhythm. Good peripheral circulation. Grossly normal heart sounds. Respiratory: Normal respiratory effort.  No retractions. Lungs CTAB. Gastrointestinal: Generalized tenderness to palpation.  No distention.   Musculoskeletal: No lower extremity tenderness nor edema. No gross deformities of extremities. Neurologic:  Normal speech and language. No gross focal neurologic deficits are appreciated.  Skin:  Skin is warm, dry and intact. No rash noted. Psychiatric: Mood and affect are normal. Speech and behavior are normal.  ____________________________________________   LABS (all labs ordered are listed, but only abnormal results are displayed)  Labs Reviewed  COMPREHENSIVE METABOLIC PANEL - Abnormal; Notable for the following components:      Result Value   Potassium 3.3 (*)    Glucose, Bld 170 (*)    All other components within normal limits  CBC - Abnormal; Notable for the following components:   MCV 79.2 (*)    MCH 25.7 (*)    RDW 20.3 (*)    All other components within normal limits  C DIFFICILE QUICK SCREEN W PCR REFLEX  GASTROINTESTINAL PANEL BY PCR, STOOL (REPLACES STOOL CULTURE)  LIPASE, BLOOD  URINALYSIS, COMPLETE (UACMP) WITH MICROSCOPIC   ____________________________________________  EKG  ED ECG REPORT I, [place] N Alveda Vanhorne, the attending physician, personally viewed  and interpreted this ECG.   Date: 01/17/2018  EKG Time: 3:11 AM  Rate: 97  Rhythm: Normal sinus rhythm  Axis: Normal  Intervals: Normal  ST&T Change: None  ____________________________________________  RADIOLOGY I, [place] Raven, personally viewed and evaluated these images (plain radiographs) as part of my medical decision making, as well as reviewing the written report by the radiologist.  ED MD interpretation: CT abdomen pelvis revealed evidence of colitis and cholelithiasis.  Official radiology report(s): Ct Abdomen Pelvis W Contrast  Result Date: 01/17/2018 CLINICAL DATA:  37 y/o M; diarrhea and abdominal pain for the last 2 days. EXAM: CT ABDOMEN AND PELVIS WITH CONTRAST TECHNIQUE: Multidetector CT imaging of the abdomen and pelvis was performed using the standard protocol following bolus administration of intravenous contrast. CONTRAST:  ISOVUE-300 IOPAMIDOL (ISOVUE-300) INJECTION 61% COMPARISON:  05/19/2017 CT abdomen and pelvis. FINDINGS: Lower chest: Small pericardial effusion. Hepatobiliary: Punctate lucency within the dome of the right lobe of liver, likely cysts. No other focal liver lesion identified. Cholelithiasis. No gallbladder wall thickening. No biliary ductal dilatation. Pancreas: Unremarkable. No pancreatic ductal dilatation or surrounding inflammatory changes. Spleen: Normal in size without focal abnormality. Adrenals/Urinary Tract: Adrenal glands are unremarkable. Kidneys are normal, without renal calculi, focal lesion, or hydronephrosis. Mild stable diffuse bladder wall thickening. Stomach/Bowel: Pan colonic diverticulosis. Mild wall thickening and pericolonic fat stranding of descending colon period. No obstructive or inflammatory  changes of the small bowel. Stomach is unremarkable. Vascular/Lymphatic: No significant vascular findings are present. No enlarged abdominal or pelvic lymph nodes. Reproductive: Prostate is unremarkable. Other: Postsurgical changes in  the left anterior abdominal wall from prior ostomy site. Musculoskeletal: Atrophy of pelvic girdle in lower extremity muscles compatible with history of paraplegia. 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 No acute osseous abnormality identified. IMPRESSION: 1. Mild wall thickening and fat stranding of descending colon compatible with colitis. No perforation or abscess. 2. Cholelithiasis. 3. Small pericardial effusion. 4. Chronic bladder wall thickening which may be related to cystitis or neurogenic bladder. Electronically Signed   By: Mitzi HansenLance  Furusawa-Stratton M.D.   On: 01/17/2018 04:37     Procedures   ____________________________________________   INITIAL IMPRESSION / ASSESSMENT AND PLAN / ED COURSE  As part of my medical decision making, I reviewed the following data within the electronic MEDICAL RECORD NUMBER   37 year old male presented with above-stated history and physical exam secondary to abdominal pain and diarrhea.  Concern for infectious etiology and as such stool culture and C. difficile culture obtained.  Patient C. difficile negative.  CT scan of the abdomen pelvis revealed evidence of colitis.  Patient's care transferred to Dr. Alphonzo LemmingsMcShane awaiting stool culture results.  Anticipate that the patient will be discharged home.     ____________________________________________  FINAL CLINICAL IMPRESSION(S) / ED DIAGNOSES  Final diagnoses:  Colitis     MEDICATIONS GIVEN DURING THIS VISIT:  Medications  morphine 4 MG/ML injection 4 mg (4 mg Intravenous Given 01/17/18 0336)  ondansetron (ZOFRAN) injection 4 mg (4 mg Intravenous Given 01/17/18 0336)  iopamidol (ISOVUE-300) 61 % injection 100 mL (100 mLs Intravenous Contrast Given 01/17/18 0357)  morphine 4 MG/ML injection 4 mg (4 mg Intravenous Given 01/17/18 0534)     ED Discharge Orders    None       Note:  This document was prepared using Dragon voice recognition software and  may include unintentional dictation errors.    Darci CurrentBrown, [place], MD 01/18/18 Earle [id number]

## 2018-01-22 ENCOUNTER — Other Ambulatory Visit: Payer: Self-pay | Admitting: Cardiovascular Disease

## 2018-02-04 ENCOUNTER — Ambulatory Visit
Admission: EM | Admit: 2018-02-04 | Discharge: 2018-02-04 | Disposition: A | Discharge status: Home / Self Care | Payer: Medicaid Other | Attending: Family Medicine | Admitting: Family Medicine

## 2018-02-04 ENCOUNTER — Other Ambulatory Visit: Payer: Self-pay

## 2018-02-04 DIAGNOSIS — B9789 Other viral agents as the cause of diseases classified elsewhere: Secondary | ICD-10-CM | POA: Diagnosis not present

## 2018-02-04 DIAGNOSIS — J0100 Acute maxillary sinusitis, unspecified: Secondary | ICD-10-CM | POA: Insufficient documentation

## 2018-02-04 DIAGNOSIS — F17210 Nicotine dependence, cigarettes, uncomplicated: Secondary | ICD-10-CM | POA: Diagnosis not present

## 2018-02-04 DIAGNOSIS — R0981 Nasal congestion: Secondary | ICD-10-CM | POA: Diagnosis not present

## 2018-02-04 MED ORDER — DOXYCYCLINE HYCLATE 100 MG PO CAPS: 100.0000 mg | ORAL_CAPSULE | Freq: Two times a day (BID) | ORAL | 0 refills | Status: DC

## 2018-02-04 NOTE — ED Triage Notes (Signed)
Patient complains of nasal congestion, sinus pain and pressure, drainage x 1 week, states that wife has also been sick.

## 2018-02-06 NOTE — ED Provider Notes (Signed)
[facility]    CSN: [id number] Arrival date & time: 02/04/18  1525  History   Chief Complaint Chief Complaint  Patient presents with  . Nasal Congestion   HPI  38 year old male with a complicated past medical history as mentioned below presents with congestion.  1 week history of sinus pressure, congestion, and pain.  Located predominantly in the maxillary region but also in the frontal region.  No fever.  No chills.  He has tried some over-the-counter medication without improvement.  His wife is also sick.  No known exacerbating factors.  No other associated symptoms.  No other complaints.  Past Medical History:  Diagnosis Date  . Abrasion or friction burn of foot and toe(s), without mention of infection   . Acid reflux   . Adopted   . Allergic rhinitis, cause unspecified   . Anxiety   . Arthritis   . Arthrogryposis   . Asthma   . Bladder wall thickening   . BPH (benign prostatic hyperplasia)   . Chronic pain syndrome   . Depressive disorder, not elsewhere classified   . Diabetes mellitus without complication (HCC)   . Diverticulitis of colon (without mention of hemorrhage)(562.11)   . ED (erectile dysfunction)   . Herpes genitalis   . History of echocardiogram    a. 10/2016: EF 50-55%, normal wall motion  . History of stress test    a. 06/2011: significant GI uptake artifact, no evidence of ischemia, EF 56%  . HIV infection (HCC)   . HTN (hypertension)   . Hyperlipidemia   . Hypogonadism in male   . Myalgia and myositis, unspecified   . OAB (overactive bladder)   . Obesity   . Other psoriasis   . Premature baby    born premature  . Seizure disorder (HCC)   . Sleep apnea   . 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 or other cause, without mention of thyrotoxic crisis or storm    hyperthyroidism  . TIA (transient ischemic attack)   . Type II or unspecified type diabetes mellitus with neurological manifestations, not stated as uncontrolled(250.60)      Patient Active Problem List   Diagnosis Date Noted  . Surgical wound, non healing   . Acute colitis 05/20/2017  . Colitis   . Acute renal failure (HCC) 05/15/2017  . Diarrhea 05/15/2017  . Sepsis (HCC) 05/15/2017  . Abdominal pain 11/25/2016  . Hypotension 10/08/2016  . Unstable angina (HCC) 10/06/2016  . Shortness of breath 06/23/2015  . Obesity 05/18/2014  . Frequent falls 08/12/2013  . Pain of left clavicle 08/12/2013  . Tachycardia 01/21/2013  . Obstructive sleep apnea on CPAP 06/07/2012  . Chest pain 06/20/2011  . Uncontrolled hypertension 06/20/2011  . Diabetes mellitus (HCC) 06/20/2011    Past Surgical History:  Procedure Laterality Date  . APPENDECTOMY    . COLOSTOMY    . CORNEAL TRANSPLANT    . feet surgery     both  . HAND SURGERY     left and right  . leg surgery     left and right  . ORTHOPEDIC SURGERY     hands, feet, knees, legs  . tubes in ears     both       Home Medications    Prior to Admission medications   Medication Sig Start Date End Date Taking? Authorizing Provider  acyclovir (ZOVIRAX) 400 MG tablet Take 400 mg by mouth 2 (two) times daily.   Yes [provider]  albuterol (PROVENTIL  HFA;VENTOLIN HFA) 108 (90 BASE) MCG/ACT inhaler Inhale 2 puffs into the lungs every 4 (four) hours as needed for wheezing or shortness of breath.    Yes [provider]  albuterol (PROVENTIL) (2.5 MG/3ML) 0.083% nebulizer solution Take 2.5 mg by nebulization every 4 (four) hours as needed for wheezing or shortness of breath.   Yes [provider]  alfuzosin (UROXATRAL) 10 MG 24 hr tablet Take 10 mg by mouth daily.   Yes [provider]  aspirin 81 MG tablet Take 81 mg by mouth daily.   Yes [provider]  BIKTARVY 50-200-25 MG TABS tablet Take 1 tablet by mouth daily. 11/21/16  Yes [provider]  Buprenorphine (BUTRANS) 15 MCG/HR PTWK Place 15 mg onto the skin once a week. Saturday   Yes [provider]  buPROPion (WELLBUTRIN XL) 150 MG 24 hr tablet Take 1 tablet by mouth daily. 11/16/16  Yes [provider]  citalopram (CELEXA) 20 MG tablet Take 20 mg by mouth daily.   Yes [provider]  diclofenac sodium (VOLTAREN) 1 % GEL Apply 2-4 g topically 4 (four) times daily as needed. For pain.   Yes [provider]  dicyclomine (BENTYL) 20 MG tablet Take 20 mg by mouth every 6 (six) hours.   Yes [provider]  famotidine (PEPCID) 20 MG tablet Take 20 mg by mouth daily.  01/03/16  Yes [provider]  feeding supplement, GLUCERNA SHAKE, (GLUCERNA SHAKE) LIQD Take 237 mLs by mouth 4 (four) times daily.   Yes [provider]  finasteride (PROSCAR) 5 MG tablet Take 5 mg by mouth daily.   Yes [provider]  fluticasone (FLOVENT HFA) 110 MCG/ACT inhaler Inhale 2 puffs into the lungs 2 (two) times daily.   Yes [provider]  gabapentin (NEURONTIN) 300 MG capsule Take 300 mg by mouth at bedtime.   Yes [provider]  hydrocortisone 2.5 % cream Apply 1 application topically 2 (two) times daily as needed for itching.    Yes [provider]  insulin degludec (TRESIBA FLEXTOUCH) 100 UNIT/ML SOPN FlexTouch Pen Inject 80 Units into the skin at bedtime.    Yes [provider]  ketoconazole (NIZORAL) 2 % shampoo Apply 1 application topically 2 (two) times a week. tues and thursday 08/27/14  Yes [provider]  loratadine (CLARITIN) 10 MG tablet Take 10 mg by mouth daily.   Yes [provider]  magnesium oxide (MAG-OX) 400 (241.3 Mg) MG tablet Take 1 tablet (400 mg total) by mouth daily. 05/23/17  Yes Salary, Evelena AsaMontell D, MD  Melatonin 5 MG TABS Take 10 mg by mouth at bedtime as needed (sleep).   Yes [provider]  metFORMIN (GLUCOPHAGE-XR) 750 MG 24 hr tablet Take 750 mg by mouth 2 (two) times daily.   Yes [provider]  metoprolol tartrate (LOPRESSOR) 100 MG  tablet Take 150 mg by mouth 2 (two) times daily.    Yes [provider]  miconazole (MICOTIN) 2 % cream Apply 1 application topically 2 (two) times daily.   Yes [provider]  mirtazapine (REMERON) 45 MG tablet Take 45 mg by mouth at bedtime.   Yes [provider]  Multiple Vitamin (MULTIVITAMIN) tablet Take 1 tablet by mouth daily.   Yes [provider]  nitroGLYCERIN (NITROSTAT) 0.4 MG SL tablet DISSOLVE 1 TABLET UNDER THE TONGUE EVERY 5 MINUTES AS NEEDED FOR CHEST PAIN. CALL 911 IF CHEST IS NOT RESOLVED WITH 3 TABLETS 01/22/18  Yes Antonieta Iba, MD  nortriptyline (PAMELOR) 25 MG capsule Take 50 mg by mouth at bedtime.   Yes [provider]  NOVOLOG FLEXPEN 100 UNIT/ML FlexPen Inject 12 Units into the skin 3 (three) times daily with meals. With every meal and every snack 12/13/15  Yes [provider]  nystatin-triamcinolone (MYCOLOG II) cream Apply 1 application topically 2 (two) times daily.   Yes [provider]  omega-3 acid ethyl esters (LOVAZA) 1 g capsule Take 1 g by mouth daily.   Yes [provider]  omeprazole (PRILOSEC) 20 MG capsule Take 20 mg by mouth daily.   Yes [provider]  ondansetron (ZOFRAN-ODT) 4 MG disintegrating tablet Take 4 mg by mouth every 8 (eight) hours as needed for nausea or vomiting.   Yes [provider]  potassium chloride SA (K-DUR,KLOR-CON) 20 MEQ tablet take 1 tablet by mouth once daily if needed Patient taking differently: take 1 tablet by mouth once daily 02/08/16  Yes Gollan, Tollie Pizza, MD  prednisoLONE acetate (PRED FORTE) 1 % ophthalmic suspension Place 1 drop into the right eye daily.    Yes [provider]  solifenacin (VESICARE) 10 MG tablet Take 10 mg by mouth daily.   Yes [provider]  tiZANidine (ZANAFLEX) 4 MG capsule Take 4 mg by mouth 3 (three) times daily as needed for muscle spasms.   Yes [provider]  doxycycline  (VIBRAMYCIN) 100 MG capsule Take 1 capsule (100 mg total) by mouth 2 (two) times daily. 02/04/18   Tommie Sams, DO    Family History Family History  Family history unknown: Yes    Social History Social History   Tobacco Use  . Smoking status: Current Every Day Smoker    Years: 0.00    Types: Cigarettes, Pipe  . Smokeless tobacco: Never Used  Substance Use Topics  . Alcohol use: No    Alcohol/week: 0.0 standard drinks    Comment: used to be moderate  . Drug use: No     Allergies   Penicillins and Erythromycin base   Review of Systems Review of Systems  Constitutional: Negative for fever.  HENT: Positive for congestion, sinus pressure and sinus pain.    Physical Exam Triage Vital Signs ED Triage Vitals  Enc Vitals Group     BP 02/04/18 1718 (!) 141/104     Pulse Rate 02/04/18 1718 87     Resp 02/04/18 1718 18     Temp 02/04/18 1718 98.2 F (36.8 C)     Temp Source 02/04/18 1718 Oral     SpO2 02/04/18 1718 100 %     Weight 02/04/18 1713 195 lb (88.5 kg)     Height 02/04/18 1713 5\' 4"  (1.626 m)     Head Circumference --      Peak Flow --      Pain Score 02/04/18 1713 8     Pain Loc --      Pain Edu? --      Excl. in GC? --    Updated Vital Signs BP (!) 141/104 (BP Location: Left Arm)   Pulse 87   Temp 98.2 F (36.8 C) (Oral)   Resp 18   Ht 5\' 4"  (1.626 m)   Wt 88.5 kg   SpO2 100%   BMI 33.47 kg/m   Visual Acuity Right Eye Distance:   Left Eye Distance:   Bilateral Distance:    Right Eye Near:   Left Eye Near:  Bilateral Near:     Physical Exam Vitals signs and nursing note reviewed.  Constitutional:      General: He is not in acute distress. HENT:     Head: Normocephalic and atraumatic.     Right Ear: Tympanic membrane normal.     Left Ear: Tympanic membrane normal.     Nose:     Comments: Maxillary sinus tenderness to palpation.    Mouth/Throat:     Pharynx: Oropharynx is clear. No posterior oropharyngeal erythema.    Cardiovascular:     Rate and Rhythm: Normal rate and regular rhythm.  Pulmonary:     Effort: Pulmonary effort is normal. No respiratory distress.  Neurological:     Mental Status: He is alert.  Psychiatric:        Mood and Affect: Mood normal.        Behavior: Behavior normal.    UC Treatments / Results  Labs (all labs ordered are listed, but only abnormal results are displayed) Labs Reviewed - No data to display  EKG None  Radiology No results found.  Procedures Procedures (including critical care time)  Medications Ordered in UC Medications - No data to display  Initial Impression / Assessment and Plan / UC Course  I have reviewed the triage vital signs and the nursing notes.  Pertinent labs & imaging results that were available during my care of the patient were reviewed by me and considered in my medical decision making (see chart for details).    38 year old male presents with suspected sinusitis.  Given comorbidities and continued/persistent symptoms, I am placing him on doxycycline.  Final Clinical Impressions(s) / UC Diagnoses   Final diagnoses:  Acute maxillary sinusitis, recurrence not specified   Discharge Instructions   None    ED Prescriptions    Medication Sig Dispense Auth. Provider   doxycycline (VIBRAMYCIN) 100 MG capsule Take 1 capsule (100 mg total) by mouth 2 (two) times daily. 14 capsule Tommie Samsook, Shyasia Funches G, DO     Controlled Substance Prescriptions [place] Controlled Substance Registry consulted? Not Applicable   Tommie SamsCook, Rilley Stash G, DO 02/06/18 [id number]

## 2018-03-19 ENCOUNTER — Other Ambulatory Visit: Payer: Self-pay | Admitting: Cardiovascular Disease

## 2018-03-23 ENCOUNTER — Encounter: Payer: Self-pay | Admitting: Gynecology

## 2018-03-23 ENCOUNTER — Other Ambulatory Visit: Payer: Self-pay

## 2018-03-23 ENCOUNTER — Ambulatory Visit
Admission: EM | Admit: 2018-03-23 | Discharge: 2018-03-23 | Disposition: A | Discharge status: Home / Self Care | Payer: Medicaid Other | Attending: Emergency Medicine | Admitting: Emergency Medicine

## 2018-03-23 DIAGNOSIS — J209 Acute bronchitis, unspecified: Secondary | ICD-10-CM | POA: Diagnosis not present

## 2018-03-23 DIAGNOSIS — J0140 Acute pansinusitis, unspecified: Secondary | ICD-10-CM | POA: Insufficient documentation

## 2018-03-23 DIAGNOSIS — J45901 Unspecified asthma with (acute) exacerbation: Secondary | ICD-10-CM | POA: Diagnosis not present

## 2018-03-23 LAB — RAPID STREP SCREEN (MED CTR MEBANE ONLY): Streptococcus, Group A Screen (Direct): NEGATIVE

## 2018-03-23 LAB — RAPID INFLUENZA A&B ANTIGENS
Influenza A (ARMC): NEGATIVE
Influenza B (ARMC): NEGATIVE

## 2018-03-23 MED ORDER — PREDNISONE 20 MG PO TABS: 40.0000 mg | ORAL_TABLET | Freq: Every day | ORAL | 0 refills | Status: AC

## 2018-03-23 MED ORDER — DOXYCYCLINE HYCLATE 100 MG PO CAPS: 100.0000 mg | ORAL_CAPSULE | Freq: Two times a day (BID) | ORAL | 0 refills | Status: AC

## 2018-03-23 MED ORDER — AEROCHAMBER PLUS MISC: 2 refills | Status: AC

## 2018-03-23 NOTE — ED Provider Notes (Signed)
HPI  SUBJECTIVE:  Cory Campbell is a 38 y.o. male who presents with 3 days of nasal congestion, rhinorrhea, postnasal drip, sinus pain and pressure, body aches, sore throat, chest congestion, cough, wheezing, shortness of breath.  He reports chest tightness and states that this feels like previous asthma exacerbations.  He denies fevers, headaches, chest pain, upper dental pain.  He states that he feels as if his face is swollen.  No antibiotics in the past month.  He took an antipyretic within 4 to 6 hours of evaluation.  He has been requiring his Adderall nebulizers 3 times a day, normally uses it as needed.  He is also taking Robitussin-DM, Flonase, Tylenol Cold and flu.  No alleviating factors.  Symptoms are worse with lying down.  He states that his kids had an upper respiratory infection last week. Patient states this is feels like a sinusitis/bronchitis. Patient has an extensive past medical history including asthma, bronchitis, HIV.  States that his viral load is undetectable.  He has a history of diabetes, on insulin and metformin.  Also hypertension, TIA, frequent sinusitis, sepsis.  PMD: Emogene Morgan, MD   Past Medical History:  Diagnosis Date  . Abrasion or friction burn of foot and toe(s), without mention of infection   . Acid reflux   . Adopted   . Allergic rhinitis, cause unspecified   . Anxiety   . Arthritis   . Arthrogryposis   . Asthma   . Bladder wall thickening   . BPH (benign prostatic hyperplasia)   . Chronic pain syndrome   . Depressive disorder, not elsewhere classified   . Diabetes mellitus without complication (HCC)   . Diverticulitis of colon (without mention of hemorrhage)(562.11)   . ED (erectile dysfunction)   . Herpes genitalis   . History of echocardiogram    a. 10/2016: EF 50-55%, normal wall motion  . History of stress test    a. 06/2011: significant GI uptake artifact, no evidence of ischemia, EF 56%  . HIV infection (HCC)   . HTN (hypertension)    . Hyperlipidemia   . Hypogonadism in male   . Myalgia and myositis, unspecified   . OAB (overactive bladder)   . Obesity   . Other psoriasis   . Premature baby    born premature  . Seizure disorder (HCC)   . Sleep apnea   . 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 or other cause, without mention of thyrotoxic crisis or storm    hyperthyroidism  . TIA (transient ischemic attack)   . Type II or unspecified type diabetes mellitus with neurological manifestations, not stated as uncontrolled(250.60)     Past Surgical History:  Procedure Laterality Date  . APPENDECTOMY    . COLOSTOMY    . CORNEAL TRANSPLANT    . feet surgery     both  . HAND SURGERY     left and right  . leg surgery     left and right  . ORTHOPEDIC SURGERY     hands, feet, knees, legs  . tubes in ears     both    Family History  Adopted: Yes  Family history unknown: Yes    Social History   Tobacco Use  . Smoking status: Current Every Day Smoker    Years: 0.00    Types: Pipe  . Smokeless tobacco: Never Used  Substance Use Topics  . Alcohol use: No    Alcohol/week: 0.0 standard drinks    Comment: used to be  moderate  . Drug use: No    No current facility-administered medications for this encounter.   Current Outpatient Medications:  .  albuterol (PROVENTIL HFA;VENTOLIN HFA) 108 (90 BASE) MCG/ACT inhaler, Inhale 2 puffs into the lungs every 4 (four) hours as needed for wheezing or shortness of breath. , Disp: , Rfl:  .  albuterol (PROVENTIL) (2.5 MG/3ML) 0.083% nebulizer solution, Take 2.5 mg by nebulization every 4 (four) hours as needed for wheezing or shortness of breath., Disp: , Rfl:  .  alfuzosin (UROXATRAL) 10 MG 24 hr tablet, Take 10 mg by mouth daily., Disp: , Rfl:  .  BIKTARVY 50-200-25 MG TABS tablet, Take 1 tablet by mouth daily., Disp: , Rfl: 1 .  Buprenorphine (BUTRANS) 15 MCG/HR PTWK, Place 15 mg onto the skin once a week. Saturday, Disp: , Rfl:  .  buPROPion (WELLBUTRIN XL) 150 MG  24 hr tablet, Take 1 tablet by mouth daily., Disp: , Rfl: 0 .  citalopram (CELEXA) 20 MG tablet, Take 20 mg by mouth daily., Disp: , Rfl:  .  diclofenac sodium (VOLTAREN) 1 % GEL, Apply 2-4 g topically 4 (four) times daily as needed. For pain., Disp: , Rfl:  .  dicyclomine (BENTYL) 20 MG tablet, Take 20 mg by mouth every 6 (six) hours., Disp: , Rfl:  .  famotidine (PEPCID) 20 MG tablet, Take 20 mg by mouth daily. , Disp: , Rfl:  .  feeding supplement, GLUCERNA SHAKE, (GLUCERNA SHAKE) LIQD, Take 237 mLs by mouth 4 (four) times daily., Disp: , Rfl:  .  finasteride (PROSCAR) 5 MG tablet, Take 5 mg by mouth daily., Disp: , Rfl:  .  fluticasone (FLOVENT HFA) 110 MCG/ACT inhaler, Inhale 2 puffs into the lungs 2 (two) times daily., Disp: , Rfl:  .  gabapentin (NEURONTIN) 300 MG capsule, Take 300 mg by mouth at bedtime., Disp: , Rfl:  .  hydrocortisone 2.5 % cream, Apply 1 application topically 2 (two) times daily as needed for itching. , Disp: , Rfl:  .  insulin degludec (TRESIBA FLEXTOUCH) 100 UNIT/ML SOPN FlexTouch Pen, Inject 80 Units into the skin at bedtime. , Disp: , Rfl:  .  ketoconazole (NIZORAL) 2 % shampoo, Apply 1 application topically 2 (two) times a week. tues and thursday, Disp: , Rfl:  .  loratadine (CLARITIN) 10 MG tablet, Take 10 mg by mouth daily., Disp: , Rfl:  .  magnesium oxide (MAG-OX) 400 (241.3 Mg) MG tablet, Take 1 tablet (400 mg total) by mouth daily., Disp: 30 tablet, Rfl: 0 .  Melatonin 5 MG TABS, Take 10 mg by mouth at bedtime as needed (sleep)., Disp: , Rfl:  .  metoprolol tartrate (LOPRESSOR) 100 MG tablet, Take 150 mg by mouth 2 (two) times daily. , Disp: , Rfl:  .  miconazole (MICOTIN) 2 % cream, Apply 1 application topically 2 (two) times daily., Disp: , Rfl:  .  mirtazapine (REMERON) 45 MG tablet, Take 45 mg by mouth at bedtime., Disp: , Rfl:  .  Multiple Vitamin (MULTIVITAMIN) tablet, Take 1 tablet by mouth daily., Disp: , Rfl:  .  nitroGLYCERIN (NITROSTAT) 0.4 MG SL  tablet, DISSOLVE 1 TABLET UNDER THE TONGUE EVERY 5 MINUTES AS NEEDED FOR CHEST PAIN. CALL 911 IF CHEST IS NOT RESOLVED WITH 3 TABLETS, Disp: 25 tablet, Rfl: 1 .  nortriptyline (PAMELOR) 25 MG capsule, Take 50 mg by mouth at bedtime., Disp: , Rfl:  .  NOVOLOG FLEXPEN 100 UNIT/ML FlexPen, Inject 12 Units into the skin 3 (three) times  daily with meals. With every meal and every snack, Disp: , Rfl: 4 .  nystatin-triamcinolone (MYCOLOG II) cream, Apply 1 application topically 2 (two) times daily., Disp: , Rfl:  .  omega-3 acid ethyl esters (LOVAZA) 1 g capsule, Take 1 g by mouth daily., Disp: , Rfl:  .  omeprazole (PRILOSEC) 20 MG capsule, Take 20 mg by mouth daily., Disp: , Rfl:  .  potassium chloride SA (K-DUR,KLOR-CON) 20 MEQ tablet, take 1 tablet by mouth once daily if needed (Patient taking differently: take 1 tablet by mouth once daily), Disp: 90 tablet, Rfl: 3 .  prednisoLONE acetate (PRED FORTE) 1 % ophthalmic suspension, Place 1 drop into the right eye daily. , Disp: , Rfl:  .  solifenacin (VESICARE) 10 MG tablet, Take 10 mg by mouth daily., Disp: , Rfl:  .  tiZANidine (ZANAFLEX) 4 MG capsule, Take 4 mg by mouth 3 (three) times daily as needed for muscle spasms., Disp: , Rfl:  .  acyclovir (ZOVIRAX) 400 MG tablet, Take 400 mg by mouth 2 (two) times daily., Disp: , Rfl:  .  aspirin 81 MG tablet, Take 81 mg by mouth daily., Disp: , Rfl:  .  doxycycline (VIBRAMYCIN) 100 MG capsule, Take 1 capsule (100 mg total) by mouth 2 (two) times daily for 7 days., Disp: 14 capsule, Rfl: 0 .  metFORMIN (GLUCOPHAGE-XR) 750 MG 24 hr tablet, Take 750 mg by mouth 2 (two) times daily., Disp: , Rfl:  .  ondansetron (ZOFRAN-ODT) 4 MG disintegrating tablet, Take 4 mg by mouth every 8 (eight) hours as needed for nausea or vomiting., Disp: , Rfl:  .  predniSONE (DELTASONE) 20 MG tablet, Take 2 tablets (40 mg total) by mouth daily with breakfast for 5 days., Disp: 10 tablet, Rfl: 0 .  Spacer/Aero-Holding Chambers  (AEROCHAMBER PLUS) inhaler, Use as instructed, Disp: 1 each, Rfl: 2  Allergies  Allergen Reactions  . Penicillins Hives, Itching and Rash    Has patient had a PCN reaction causing immediate rash, facial/tongue/throat swelling, SOB or lightheadedness with hypotension: Yes Has patient had a PCN reaction causing severe rash involving mucus membranes or skin necrosis: No Has patient had a PCN reaction that required hospitalization No Has patient had a PCN reaction occurring within the last 10 years: No If all of the above answers are "NO", then may proceed with Cephalosporin use.   . Erythromycin Base Itching and Rash     ROS  As noted in HPI.   Physical Exam  BP (!) 143/99   Pulse 87   Resp 18   Wt 88.5 kg   SpO2 100%   BMI 33.47 kg/m   Constitutional: Well developed, well nourished, no acute distress Eyes:  EOMI, conjunctiva normal bilaterally HENT: Normocephalic, atraumatic,mucus membranes moist.  Positive purulent nasal congestion.  Positive erythematous, swollen turbinates.  Positive frontal and maxillary sinus tenderness.  Normal oropharynx.  Normal tonsils without exudates.  Uvula midline.  Positive postnasal drip and cobblestoning. Neck: No cervical lymphadenopathy Respiratory: Normal inspiratory effort, good air movement, single expiratory wheeze right lower lobe.  No rales, rhonchi. Cardiovascular: Normal rate, regular rhythm, no murmurs, rubs, gallops GI: nondistended skin: No rash, skin intact Musculoskeletal: no deformities Neurologic: Alert & oriented x 3, no focal neuro deficits Psychiatric: Speech and behavior appropriate   ED Course   Medications - No data to display  Orders Placed This Encounter  Procedures  . Rapid Strep Screen ([facility] ONLY)    Standing Status:   Standing  Number of Occurrences:   1  . Culture, group A strep    Standing Status:   Standing    Number of Occurrences:   1  . Rapid Influenza A&B Antigens ([facility] only)     Standing Status:   Standing    Number of Occurrences:   1  . Droplet precaution    Standing Status:   Standing    Number of Occurrences:   1    Results for orders placed or performed during the hospital encounter of 03/23/18 (from the past 24 hour(s))  Rapid Strep Screen ([facility] ONLY)     Status: None   Collection Time: 03/23/18 12:01 PM  Result Value Ref Range   Streptococcus, Group A Screen (Direct) NEGATIVE NEGATIVE  Rapid Influenza A&B Antigens ([facility] only)     Status: None   Collection Time: 03/23/18  1:31 PM  Result Value Ref Range   Influenza A ([facility]) NEGATIVE NEGATIVE   Influenza B ([facility]) NEGATIVE NEGATIVE   No results found.  ED Clinical Impression  Acute non-recurrent pansinusitis  Asthma with acute exacerbation, unspecified asthma severity, unspecified whether persistent  Acute bronchitis, unspecified organism   ED Assessment/Plan  Rapid strep negative.  Presentation most consistent with a sinusitis with asthma exacerbation versus bronchitis, but will check flu due to multiple comorbidities.  Plan for him to continue Flonase, saline nasal irrigation with a Lloyd Huger med rinse and distilled water as often as he wants, Mucinex, 40 mg of prednisone for 5 days, he is to adjust his insulin accordingly.  He is to do albuterol on a regular basis, every 4 hours for 2 days, then every 6 hours for 2 days, and then as needed.  States he does not need a prescription for albuterol nebs or for inhaler.  We will also prescribe a spacer.  Follow-up with PMD in several days.  Gave patient strict ER return precautions.  Discussed labs, MDM, treatment plan, and plan for follow-up with patient. Discussed sn/sx that should prompt return to the ED. patient agrees with plan.   Meds ordered this encounter  Medications  . doxycycline (VIBRAMYCIN) 100 MG capsule    Sig: Take 1 capsule (100 mg total) by mouth 2 (two) times daily for 7 days.    Dispense:  14 capsule    Refill:  0  .  predniSONE (DELTASONE) 20 MG tablet    Sig: Take 2 tablets (40 mg total) by mouth daily with breakfast for 5 days.    Dispense:  10 tablet    Refill:  0  . Spacer/Aero-Holding Chambers (AEROCHAMBER PLUS) inhaler    Sig: Use as instructed    Dispense:  1 each    Refill:  2    *This clinic note was created using Dragon dictation software. Therefore, there may be occasional mistakes despite careful proofreading.   ?    Domenick Gong, MD 03/23/18 [phone]

## 2018-03-23 NOTE — Discharge Instructions (Addendum)
Influenza was negative.  We will treat this as a sinusitis/asthma/bronchitis exacerbation with doxycycline.  continue Flonase, start saline nasal irrigation with a Lloyd Huger med rinse and distilled water as often as you want, Mucinex, 40 mg of prednisone for 5 days, adjust your insulin accordingly.  albuterol utilizer or 2 puffs from your albuterol inhaler using a spacer on a regular basis, every 4 hours for 2 days, then every 6 hours for 2 days, and then as needed.  Follow-up with PMD in several days.

## 2018-03-23 NOTE — ED Triage Notes (Signed)
Patient c/o cough / chest congestion x 3 days/ sinus drainage / sore throat

## 2018-03-26 LAB — CULTURE, GROUP A STREP (THRC)

## 2018-04-06 ENCOUNTER — Ambulatory Visit
Admission: EM | Admit: 2018-04-06 | Discharge: 2018-04-06 | Disposition: A | Discharge status: Home / Self Care | Payer: Medicaid Other | Attending: Family Medicine | Admitting: Family Medicine

## 2018-04-06 DIAGNOSIS — B9689 Other specified bacterial agents as the cause of diseases classified elsewhere: Secondary | ICD-10-CM

## 2018-04-06 DIAGNOSIS — N481 Balanitis: Secondary | ICD-10-CM | POA: Diagnosis not present

## 2018-04-06 MED ORDER — FLUCONAZOLE 150 MG PO TABS: 150.0000 mg | ORAL_TABLET | Freq: Every day | ORAL | 0 refills | Status: DC

## 2018-04-06 NOTE — ED Provider Notes (Signed)
[facility]    CSN: [id number] Arrival date & time: 04/06/18  1251     History   Chief Complaint Chief Complaint  Patient presents with  . Rash    HPI Cory Campbell is a 38 y.o. male.   38 yo male with a c/o skin rash to his penile foreskin for the past 5 days. States it's itchy and burning and the burning to the skin is worse after urinating. States he's had this in the past after taking antibiotics and he recently completed a course of oral antibiotic for sinusitis. Denies any penile discharge.   The history is provided by the patient.  Rash    Past Medical History:  Diagnosis Date  . Abrasion or friction burn of foot and toe(s), without mention of infection   . Acid reflux   . Adopted   . Allergic rhinitis, cause unspecified   . Anxiety   . Arthritis   . Arthrogryposis   . Asthma   . Bladder wall thickening   . BPH (benign prostatic hyperplasia)   . Chronic pain syndrome   . Depressive disorder, not elsewhere classified   . Diabetes mellitus without complication (HCC)   . Diverticulitis of colon (without mention of hemorrhage)(562.11)   . ED (erectile dysfunction)   . Herpes genitalis   . History of echocardiogram    a. 10/2016: EF 50-55%, normal wall motion  . History of stress test    a. 06/2011: significant GI uptake artifact, no evidence of ischemia, EF 56%  . HIV infection (HCC)   . HTN (hypertension)   . Hyperlipidemia   . Hypogonadism in male   . Myalgia and myositis, unspecified   . OAB (overactive bladder)   . Obesity   . Other psoriasis   . Premature baby    born premature  . Seizure disorder (HCC)   . Sleep apnea   . 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 or other cause, without mention of thyrotoxic crisis or storm    hyperthyroidism  . TIA (transient ischemic attack)   . Type II or unspecified type diabetes mellitus with neurological manifestations, not stated as uncontrolled(250.60)     Patient Active Problem List   Diagnosis Date Noted  . Surgical wound, non healing   . Acute colitis 05/20/2017  . Colitis   . Acute renal failure (HCC) 05/15/2017  . Diarrhea 05/15/2017  . Sepsis (HCC) 05/15/2017  . Abdominal pain 11/25/2016  . Hypotension 10/08/2016  . Unstable angina (HCC) 10/06/2016  . Shortness of breath 06/23/2015  . Obesity 05/18/2014  . Frequent falls 08/12/2013  . Pain of left clavicle 08/12/2013  . Tachycardia 01/21/2013  . Obstructive sleep apnea on CPAP 06/07/2012  . Chest pain 06/20/2011  . Uncontrolled hypertension 06/20/2011  . Diabetes mellitus (HCC) 06/20/2011    Past Surgical History:  Procedure Laterality Date  . APPENDECTOMY    . COLOSTOMY    . CORNEAL TRANSPLANT    . feet surgery     both  . HAND SURGERY     left and right  . leg surgery     left and right  . ORTHOPEDIC SURGERY     hands, feet, knees, legs  . tubes in ears     both       Home Medications    Prior to Admission medications   Medication Sig Start Date End Date Taking? Authorizing Provider  acyclovir (ZOVIRAX) 400 MG tablet Take 400 mg by mouth 2 (two) times daily.  Yes [provider]  albuterol (PROVENTIL HFA;VENTOLIN HFA) 108 (90 BASE) MCG/ACT inhaler Inhale 2 puffs into the lungs every 4 (four) hours as needed for wheezing or shortness of breath.    Yes [provider]  albuterol (PROVENTIL) (2.5 MG/3ML) 0.083% nebulizer solution Take 2.5 mg by nebulization every 4 (four) hours as needed for wheezing or shortness of breath.   Yes [provider]  alfuzosin (UROXATRAL) 10 MG 24 hr tablet Take 10 mg by mouth daily.   Yes [provider]  aspirin 81 MG tablet Take 81 mg by mouth daily.   Yes [provider]  BIKTARVY 50-200-25 MG TABS tablet Take 1 tablet by mouth daily. 11/21/16  Yes [provider]  Buprenorphine (BUTRANS) 15 MCG/HR PTWK Place 15 mg onto the skin once a week. Saturday   Yes [provider]  buPROPion (WELLBUTRIN  XL) 150 MG 24 hr tablet Take 1 tablet by mouth daily. 11/16/16  Yes [provider]  citalopram (CELEXA) 20 MG tablet Take 20 mg by mouth daily.   Yes [provider]  diclofenac sodium (VOLTAREN) 1 % GEL Apply 2-4 g topically 4 (four) times daily as needed. For pain.   Yes [provider]  dicyclomine (BENTYL) 20 MG tablet Take 20 mg by mouth every 6 (six) hours.   Yes [provider]  famotidine (PEPCID) 20 MG tablet Take 20 mg by mouth daily.  01/03/16  Yes [provider]  feeding supplement, GLUCERNA SHAKE, (GLUCERNA SHAKE) LIQD Take 237 mLs by mouth 4 (four) times daily.   Yes [provider]  finasteride (PROSCAR) 5 MG tablet Take 5 mg by mouth daily.   Yes [provider]  fluticasone (FLOVENT HFA) 110 MCG/ACT inhaler Inhale 2 puffs into the lungs 2 (two) times daily.   Yes [provider]  gabapentin (NEURONTIN) 300 MG capsule Take 300 mg by mouth at bedtime.   Yes [provider]  hydrocortisone 2.5 % cream Apply 1 application topically 2 (two) times daily as needed for itching.    Yes [provider]  insulin degludec (TRESIBA FLEXTOUCH) 100 UNIT/ML SOPN FlexTouch Pen Inject 80 Units into the skin at bedtime.    Yes [provider]  ketoconazole (NIZORAL) 2 % shampoo Apply 1 application topically 2 (two) times a week. tues and thursday 08/27/14  Yes [provider]  loratadine (CLARITIN) 10 MG tablet Take 10 mg by mouth daily.   Yes [provider]  magnesium oxide (MAG-OX) 400 (241.3 Mg) MG tablet Take 1 tablet (400 mg total) by mouth daily. 05/23/17  Yes Salary, Evelena Asa, MD  Melatonin 5 MG TABS Take 10 mg by mouth at bedtime as needed (sleep).   Yes [provider]  metFORMIN (GLUCOPHAGE-XR) 750 MG 24 hr tablet Take 750 mg by mouth 2 (two) times daily.   Yes [provider]  metoprolol tartrate (LOPRESSOR) 100 MG tablet Take 150 mg by mouth 2 (two) times  daily.    Yes [provider]  miconazole (MICOTIN) 2 % cream Apply 1 application topically 2 (two) times daily.   Yes [provider]  mirtazapine (REMERON) 45 MG tablet Take 45 mg by mouth at bedtime.   Yes [provider]  Multiple Vitamin (MULTIVITAMIN) tablet Take 1 tablet by mouth daily.   Yes [provider]  nitroGLYCERIN (NITROSTAT) 0.4 MG SL tablet DISSOLVE 1 TABLET UNDER THE TONGUE EVERY 5 MINUTES AS NEEDED FOR CHEST PAIN. CALL 911 IF CHEST  IS NOT RESOLVED WITH 3 TABLETS 03/19/18  Yes Gollan, Tollie Pizza, MD  nortriptyline (PAMELOR) 25 MG capsule Take 50 mg by mouth at bedtime.   Yes [provider]  NOVOLOG FLEXPEN 100 UNIT/ML FlexPen Inject 12 Units into the skin 3 (three) times daily with meals. With every meal and every snack 12/13/15  Yes [provider]  nystatin-triamcinolone (MYCOLOG II) cream Apply 1 application topically 2 (two) times daily.   Yes [provider]  omega-3 acid ethyl esters (LOVAZA) 1 g capsule Take 1 g by mouth daily.   Yes [provider]  omeprazole (PRILOSEC) 20 MG capsule Take 20 mg by mouth daily.   Yes [provider]  ondansetron (ZOFRAN-ODT) 4 MG disintegrating tablet Take 4 mg by mouth every 8 (eight) hours as needed for nausea or vomiting.   Yes [provider]  potassium chloride SA (K-DUR,KLOR-CON) 20 MEQ tablet take 1 tablet by mouth once daily if needed Patient taking differently: take 1 tablet by mouth once daily 02/08/16  Yes Gollan, Tollie Pizza, MD  prednisoLONE acetate (PRED FORTE) 1 % ophthalmic suspension Place 1 drop into the right eye daily.    Yes [provider]  solifenacin (VESICARE) 10 MG tablet Take 10 mg by mouth daily.   Yes [provider]  Spacer/Aero-Holding Chambers (AEROCHAMBER PLUS) inhaler Use as instructed 03/23/18  Yes Domenick Gong, MD  tiZANidine (ZANAFLEX) 4 MG capsule Take 4 mg by mouth 3 (three) times daily as needed  for muscle spasms.   Yes [provider]  fluconazole (DIFLUCAN) 150 MG tablet Take 1 tablet (150 mg total) by mouth daily. Repeat in 1 week 04/06/18   [name], MD    Family History Family History  Adopted: Yes  Family history unknown: Yes    Social History Social History   Tobacco Use  . Smoking status: Current Every Day Smoker    Years: 0.00    Types: Pipe  . Smokeless tobacco: Never Used  Substance Use Topics  . Alcohol use: No    Alcohol/week: 0.0 standard drinks    Comment: used to be moderate  . Drug use: No     Allergies   Penicillins and Erythromycin base   Review of Systems Review of Systems  Skin: Positive for rash.     Physical Exam Triage Vital Signs ED Triage Vitals  Enc Vitals Group     BP 04/06/18 1312 (!) 135/99     Pulse Rate 04/06/18 1312 100     Resp 04/06/18 1312 18     Temp 04/06/18 1312 98.2 F (36.8 C)     Temp Source 04/06/18 1312 Oral     SpO2 04/06/18 1312 98 %     Weight 04/06/18 1314 185 lb (83.9 kg)     Height --      Head Circumference --      Peak Flow --      Pain Score 04/06/18 1314 0     Pain Loc --      Pain Edu? --      Excl. in GC? --    No data found.  Updated Vital Signs BP (!) 135/99 (BP Location: Left Arm)   Pulse 100   Temp 98.2 F (36.8 C) (Oral)   Resp 18   Wt 83.9 kg   SpO2 98%   BMI 31.76 kg/m   Visual Acuity Right Eye Distance:   Left Eye Distance:   Bilateral Distance:    Right Eye  Near:   Left Eye Near:    Bilateral Near:     Physical Exam Vitals signs and nursing note reviewed.  Constitutional:      General: He is not in acute distress.    Appearance: He is not toxic-appearing or diaphoretic.  Genitourinary:    Comments: Penile foreskin with scaly, cracked, excoriated area; no drainage; no blisters Neurological:     Mental Status: He is alert.      UC Treatments / Results  Labs (all labs ordered are listed, but only abnormal results are displayed) Labs  Reviewed - No data to display  EKG None  Radiology No results found.  Procedures Procedures (including critical care time)  Medications Ordered in UC Medications - No data to display  Initial Impression / Assessment and Plan / UC Course  I have reviewed the triage vital signs and the nursing notes.  Pertinent labs & imaging results that were available during my care of the patient were reviewed by me and considered in my medical decision making (see chart for details).      Final Clinical Impressions(s) / UC Diagnoses   Final diagnoses:  Balanitis     Discharge Instructions     Continue antifungal cream twice daily    ED Prescriptions    Medication Sig Dispense Auth. Provider   fluconazole (DIFLUCAN) 150 MG tablet Take 1 tablet (150 mg total) by mouth daily. Repeat in 1 week 2 tablet [name], MD     1. diagnosis reviewed with patient 2. rx as per orders above; reviewed possible side effects, interactions, risks and benefits  3. Recommend continue antifungal cream  4. Follow-up prn if symptoms worsen or don't improve  Controlled Substance Prescriptions [place] Controlled Substance Registry consulted? Not Applicable   [name], MD 04/06/18 [phone]

## 2018-04-06 NOTE — ED Triage Notes (Signed)
Pt here for rash in his groin area that has been present for 5 days. States it also hurts when he urinates. States he thinks it came from the antibiotic use and does itch. Has been putting clomitazole on the rash without relief. No drainage reported.

## 2018-04-06 NOTE — Discharge Instructions (Signed)
Continue antifungal cream twice daily

## 2018-04-18 ENCOUNTER — Ambulatory Visit
Admission: EM | Admit: 2018-04-18 | Discharge: 2018-04-18 | Disposition: A | Discharge status: Home / Self Care | Payer: Medicaid Other | Attending: Physician Assistant | Admitting: Physician Assistant

## 2018-04-18 ENCOUNTER — Other Ambulatory Visit: Payer: Self-pay

## 2018-04-18 ENCOUNTER — Encounter: Payer: Self-pay | Admitting: Emergency Medicine

## 2018-04-18 ENCOUNTER — Ambulatory Visit: Payer: Medicaid Other

## 2018-04-18 DIAGNOSIS — K219 Gastro-esophageal reflux disease without esophagitis: Secondary | ICD-10-CM | POA: Insufficient documentation

## 2018-04-18 DIAGNOSIS — G40909 Epilepsy, unspecified, not intractable, without status epilepticus: Secondary | ICD-10-CM | POA: Insufficient documentation

## 2018-04-18 DIAGNOSIS — Z880 Allergy status to penicillin: Secondary | ICD-10-CM | POA: Insufficient documentation

## 2018-04-18 DIAGNOSIS — G4733 Obstructive sleep apnea (adult) (pediatric): Secondary | ICD-10-CM | POA: Diagnosis not present

## 2018-04-18 DIAGNOSIS — E119 Type 2 diabetes mellitus without complications: Secondary | ICD-10-CM | POA: Diagnosis not present

## 2018-04-18 DIAGNOSIS — B20 Human immunodeficiency virus [HIV] disease: Secondary | ICD-10-CM | POA: Diagnosis not present

## 2018-04-18 DIAGNOSIS — Z794 Long term (current) use of insulin: Secondary | ICD-10-CM | POA: Diagnosis not present

## 2018-04-18 DIAGNOSIS — F329 Major depressive disorder, single episode, unspecified: Secondary | ICD-10-CM | POA: Insufficient documentation

## 2018-04-18 DIAGNOSIS — Z933 Colostomy status: Secondary | ICD-10-CM | POA: Insufficient documentation

## 2018-04-18 DIAGNOSIS — F419 Anxiety disorder, unspecified: Secondary | ICD-10-CM | POA: Insufficient documentation

## 2018-04-18 DIAGNOSIS — I10 Essential (primary) hypertension: Secondary | ICD-10-CM | POA: Insufficient documentation

## 2018-04-18 DIAGNOSIS — M19012 Primary osteoarthritis, left shoulder: Secondary | ICD-10-CM | POA: Insufficient documentation

## 2018-04-18 DIAGNOSIS — Z947 Corneal transplant status: Secondary | ICD-10-CM | POA: Insufficient documentation

## 2018-04-18 DIAGNOSIS — Z6830 Body mass index (BMI) 30.0-30.9, adult: Secondary | ICD-10-CM | POA: Diagnosis not present

## 2018-04-18 DIAGNOSIS — Z79899 Other long term (current) drug therapy: Secondary | ICD-10-CM | POA: Diagnosis not present

## 2018-04-18 DIAGNOSIS — Z791 Long term (current) use of non-steroidal anti-inflammatories (NSAID): Secondary | ICD-10-CM | POA: Insufficient documentation

## 2018-04-18 DIAGNOSIS — M25512 Pain in left shoulder: Secondary | ICD-10-CM | POA: Insufficient documentation

## 2018-04-18 DIAGNOSIS — N3281 Overactive bladder: Secondary | ICD-10-CM | POA: Insufficient documentation

## 2018-04-18 DIAGNOSIS — Z7952 Long term (current) use of systemic steroids: Secondary | ICD-10-CM | POA: Diagnosis not present

## 2018-04-18 DIAGNOSIS — W19XXXA Unspecified fall, initial encounter: Secondary | ICD-10-CM | POA: Insufficient documentation

## 2018-04-18 DIAGNOSIS — E785 Hyperlipidemia, unspecified: Secondary | ICD-10-CM | POA: Insufficient documentation

## 2018-04-18 DIAGNOSIS — N400 Enlarged prostate without lower urinary tract symptoms: Secondary | ICD-10-CM | POA: Insufficient documentation

## 2018-04-18 DIAGNOSIS — E669 Obesity, unspecified: Secondary | ICD-10-CM | POA: Insufficient documentation

## 2018-04-18 DIAGNOSIS — J45909 Unspecified asthma, uncomplicated: Secondary | ICD-10-CM | POA: Diagnosis not present

## 2018-04-18 DIAGNOSIS — G894 Chronic pain syndrome: Secondary | ICD-10-CM | POA: Insufficient documentation

## 2018-04-18 DIAGNOSIS — Z8673 Personal history of transient ischemic attack (TIA), and cerebral infarction without residual deficits: Secondary | ICD-10-CM | POA: Diagnosis not present

## 2018-04-18 DIAGNOSIS — Z87891 Personal history of nicotine dependence: Secondary | ICD-10-CM | POA: Insufficient documentation

## 2018-04-18 DIAGNOSIS — Z881 Allergy status to other antibiotic agents status: Secondary | ICD-10-CM | POA: Insufficient documentation

## 2018-04-18 MED ORDER — NAPROXEN 500 MG PO TABS: 500.0000 mg | ORAL_TABLET | Freq: Two times a day (BID) | ORAL | 0 refills | Status: DC

## 2018-04-18 NOTE — ED Triage Notes (Signed)
Patient in today c/o left shoulder pain after falling on 04/13/18 and landing on his left shoulder.

## 2018-04-18 NOTE — Discharge Instructions (Addendum)
-  Naproxen sodium: 1 tablet twice daily as needed for pain -Shoulder exercises attached -Can use warm compresses for pain -Continue your home medications.  Your gabapentin and muscle relaxer should help as well. -Should you continue to have issues with the shoulder, recommend following with orthopedics. -Left arm sling for comfort.

## 2018-04-18 NOTE — ED Provider Notes (Signed)
[facility]    CSN: [id number] Arrival date & time: 04/18/18  1351     History   Chief Complaint Chief Complaint  Patient presents with  . Fall    DOI 04/13/18  . Shoulder Injury    HPI Cory Campbell is a 38 y.o. male.   Patient is a 38 year old male with medical history as noted below who presents with complaint of left shoulder pain.  Patient states he fell this past Saturday and hit his shoulder on the wall at home.  Patient reports pain with minimal movement.  He reports taking muscle relaxant and Goody's powder at home.  States he is taken about 2 Goody powders a day but does report taking Pepcid as well at home.  Patient does have a history of chronic shoulder pain related to osteoarthritis.     Past Medical History:  Diagnosis Date  . Abrasion or friction burn of foot and toe(s), without mention of infection   . Acid reflux   . Adopted   . Allergic rhinitis, cause unspecified   . Anxiety   . Arthritis   . Arthrogryposis   . Asthma   . Bladder wall thickening   . BPH (benign prostatic hyperplasia)   . Chronic pain syndrome   . Depressive disorder, not elsewhere classified   . Diabetes mellitus without complication (HCC)   . Diverticulitis of colon (without mention of hemorrhage)(562.11)   . ED (erectile dysfunction)   . Herpes genitalis   . History of echocardiogram    a. 10/2016: EF 50-55%, normal wall motion  . History of stress test    a. 06/2011: significant GI uptake artifact, no evidence of ischemia, EF 56%  . HIV infection (HCC)   . HTN (hypertension)   . Hyperlipidemia   . Hypogonadism in male   . Myalgia and myositis, unspecified   . OAB (overactive bladder)   . Obesity   . Other psoriasis   . Premature baby    born premature  . Seizure disorder (HCC)   . Sleep apnea   . 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 or other cause, without mention of thyrotoxic crisis or storm    hyperthyroidism  . TIA (transient ischemic attack)   .  Type II or unspecified type diabetes mellitus with neurological manifestations, not stated as uncontrolled(250.60)     Patient Active Problem List   Diagnosis Date Noted  . Surgical wound, non healing   . Acute colitis 05/20/2017  . Colitis   . Acute renal failure (HCC) 05/15/2017  . Diarrhea 05/15/2017  . Sepsis (HCC) 05/15/2017  . Abdominal pain 11/25/2016  . Hypotension 10/08/2016  . Unstable angina (HCC) 10/06/2016  . Shortness of breath 06/23/2015  . Obesity 05/18/2014  . Frequent falls 08/12/2013  . Pain of left clavicle 08/12/2013  . Tachycardia 01/21/2013  . Obstructive sleep apnea on CPAP 06/07/2012  . Chest pain 06/20/2011  . Uncontrolled hypertension 06/20/2011  . Diabetes mellitus (HCC) 06/20/2011    Past Surgical History:  Procedure Laterality Date  . APPENDECTOMY    . COLOSTOMY    . CORNEAL TRANSPLANT    . feet surgery     both  . HAND SURGERY     left and right  . leg surgery     left and right  . ORTHOPEDIC SURGERY     hands, feet, knees, legs  . tubes in ears     both       Home Medications  Prior to Admission medications   Medication Sig Start Date End Date Taking? Authorizing Provider  acyclovir (ZOVIRAX) 400 MG tablet Take 400 mg by mouth 2 (two) times daily.   Yes [provider]  albuterol (PROVENTIL HFA;VENTOLIN HFA) 108 (90 BASE) MCG/ACT inhaler Inhale 2 puffs into the lungs every 4 (four) hours as needed for wheezing or shortness of breath.    Yes [provider]  albuterol (PROVENTIL) (2.5 MG/3ML) 0.083% nebulizer solution Take 2.5 mg by nebulization every 4 (four) hours as needed for wheezing or shortness of breath.   Yes [provider]  alfuzosin (UROXATRAL) 10 MG 24 hr tablet Take 10 mg by mouth daily.   Yes [provider]  BIKTARVY 50-200-25 MG TABS tablet Take 1 tablet by mouth daily. 11/21/16  Yes [provider]  Buprenorphine (BUTRANS) 15 MCG/HR PTWK Place 15 mg onto the skin once  a week. Saturday   Yes [provider]  buPROPion (WELLBUTRIN XL) 150 MG 24 hr tablet Take 1 tablet by mouth daily. 11/16/16  Yes [provider]  citalopram (CELEXA) 20 MG tablet Take 20 mg by mouth daily.   Yes [provider]  diclofenac sodium (VOLTAREN) 1 % GEL Apply 2-4 g topically 4 (four) times daily as needed. For pain.   Yes [provider]  dicyclomine (BENTYL) 20 MG tablet Take 20 mg by mouth every 6 (six) hours.   Yes [provider]  famotidine (PEPCID) 20 MG tablet Take 20 mg by mouth daily.  01/03/16  Yes [provider]  feeding supplement, GLUCERNA SHAKE, (GLUCERNA SHAKE) LIQD Take 237 mLs by mouth 4 (four) times daily.   Yes [provider]  ferrous sulfate 325 (65 FE) MG tablet Take by mouth.   Yes [provider]  finasteride (PROSCAR) 5 MG tablet Take 5 mg by mouth daily.   Yes [provider]  fluticasone (FLOVENT HFA) 110 MCG/ACT inhaler Inhale 2 puffs into the lungs 2 (two) times daily.   Yes [provider]  gabapentin (NEURONTIN) 300 MG capsule Take 300 mg by mouth at bedtime.   Yes [provider]  hydrocortisone 2.5 % cream Apply 1 application topically 2 (two) times daily as needed for itching.    Yes [provider]  insulin degludec (TRESIBA FLEXTOUCH) 100 UNIT/ML SOPN FlexTouch Pen Inject 80 Units into the skin at bedtime.    Yes [provider]  ketoconazole (NIZORAL) 2 % shampoo Apply 1 application topically 2 (two) times a week. tues and thursday 08/27/14  Yes [provider]  loratadine (CLARITIN) 10 MG tablet Take 10 mg by mouth daily.   Yes [provider]  magnesium oxide (MAG-OX) 400 (241.3 Mg) MG tablet Take 1 tablet (400 mg total) by mouth daily. 05/23/17  Yes Salary, Evelena Asa, MD  Melatonin 5 MG TABS Take 10 mg by mouth at bedtime as needed (sleep).   Yes [provider]  metFORMIN (GLUCOPHAGE-XR) 750 MG 24 hr  tablet Take 750 mg by mouth 2 (two) times daily.   Yes [provider]  metoprolol tartrate (LOPRESSOR) 100 MG tablet Take 150 mg by mouth 2 (two) times daily.    Yes [provider]  miconazole (MICOTIN) 2 % cream Apply 1 application topically 2 (two) times daily.   Yes [provider]  mirtazapine (REMERON) 45 MG tablet Take 45 mg by mouth at bedtime.   Yes [provider]  Multiple Vitamin (MULTIVITAMIN) tablet Take 1 tablet by mouth  daily.   Yes [provider]  nitroGLYCERIN (NITROSTAT) 0.4 MG SL tablet DISSOLVE 1 TABLET UNDER THE TONGUE EVERY 5 MINUTES AS NEEDED FOR CHEST PAIN. CALL 911 IF CHEST IS NOT RESOLVED WITH 3 TABLETS 03/19/18  Yes Gollan, Tollie Pizza, MD  nortriptyline (PAMELOR) 25 MG capsule Take 50 mg by mouth at bedtime.   Yes [provider]  NOVOLOG FLEXPEN 100 UNIT/ML FlexPen Inject 12 Units into the skin 3 (three) times daily with meals. With every meal and every snack 12/13/15  Yes [provider]  nystatin-triamcinolone (MYCOLOG II) cream Apply 1 application topically 2 (two) times daily.   Yes [provider]  omega-3 acid ethyl esters (LOVAZA) 1 g capsule Take 1 g by mouth daily.   Yes [provider]  omeprazole (PRILOSEC) 20 MG capsule Take 20 mg by mouth daily.   Yes [provider]  ondansetron (ZOFRAN-ODT) 4 MG disintegrating tablet Take 4 mg by mouth every 8 (eight) hours as needed for nausea or vomiting.   Yes [provider]  potassium chloride SA (K-DUR,KLOR-CON) 20 MEQ tablet take 1 tablet by mouth once daily if needed Patient taking differently: take 1 tablet by mouth once daily 02/08/16  Yes Gollan, Tollie Pizza, MD  prednisoLONE acetate (PRED FORTE) 1 % ophthalmic suspension Place 1 drop into the right eye daily.    Yes [provider]  solifenacin (VESICARE) 10 MG tablet Take 10 mg by mouth daily.   Yes [provider]  Spacer/Aero-Holding Chambers  (AEROCHAMBER PLUS) inhaler Use as instructed 03/23/18  Yes Domenick Gong, MD  tiZANidine (ZANAFLEX) 4 MG capsule Take 4 mg by mouth 3 (three) times daily as needed for muscle spasms.   Yes [provider]  naproxen (NAPROSYN) 500 MG tablet Take 1 tablet (500 mg total) by mouth 2 (two) times daily. 04/18/18   Candis Schatz, PA-C    Family History Family History  Adopted: Yes  Family history unknown: Yes    Social History Social History   Tobacco Use  . Smoking status: Former Smoker    Years: 0.00    Types: Pipe    Last attempt to quit: 01/2018    Years since quitting: 0.2  . Smokeless tobacco: Never Used  Substance Use Topics  . Alcohol use: Not Currently    Alcohol/week: 0.0 standard drinks    Comment: used to be moderate  . Drug use: No     Allergies   Penicillins and Erythromycin base   Review of Systems Review of Systems  -As above in HPI.  Other systems reviewed and found to be negative.   Physical Exam Triage Vital Signs ED Triage Vitals  Enc Vitals Group     BP 04/18/18 1441 (!) 133/97     Pulse Rate 04/18/18 1441 92     Resp 04/18/18 1441 16     Temp 04/18/18 1441 98.1 F (36.7 C)     Temp Source 04/18/18 1441 Oral     SpO2 04/18/18 1441 98 %     Weight 04/18/18 1440 175 lb (79.4 kg)     Height 04/18/18 1440 5\' 4"  (1.626 m)     Head Circumference --      Peak Flow --      Pain Score 04/18/18 1440 7     Pain Loc --      Pain Edu? --      Excl. in GC? --    No data found.  Updated Vital Signs BP Marland Kitchen)  133/97 (BP Location: Right Arm)   Pulse 92   Temp 98.1 F (36.7 C) (Oral)   Resp 16   Ht 5\' 4"  (1.626 m)   Wt 175 lb (79.4 kg)   SpO2 98%   BMI 30.04 kg/m    Physical Exam Constitutional:      Comments: Sitting in a motorized wheelchair. lower body smaller in size compared to the upper body.  Cardiovascular:     Rate and Rhythm: Normal rate and regular rhythm.  Pulmonary:     Effort: Pulmonary effort is normal.   Musculoskeletal:     Left shoulder: He exhibits tenderness. He exhibits normal range of motion.       Arms:     Comments: Left shoulder tender to palpation about the joint.  Pain with abduction once reaching for approximate 45 degrees.  Some tenderness with internal and external rotation.  Neurological:     Mental Status: He is alert and oriented to person, place, and time.      UC Treatments / Results  Labs (all labs ordered are listed, but only abnormal results are displayed) Labs Reviewed - No data to display  EKG None  Radiology Dg Shoulder Left  Result Date: 04/18/2018 CLINICAL DATA:  LEFT shoulder pain after fall April 13, 2018. EXAM: LEFT SHOULDER - 2+ VIEW; LEFT HUMERUS - 2+ VIEW COMPARISON:  LEFT shoulder radiograph August 12, 2013 FINDINGS: LEFT shoulder: The humeral head is located, greater tuberosity focal concavity. Mild acromioclavicular and glenohumeral osteoarthrosis. The subacromial, glenohumeral and acromioclavicular joint spaces are intact. No destructive bony lesions. Soft tissue planes are non-suspicious. LEFT humerus: No acute fracture deformity or dislocation. No destructive bony lesions. Soft tissue planes are not suspicious. IMPRESSION: 1. No acute fracture deformity or dislocation. 2. Posterolateral humeral head deformity most compatible with Hill-Sachs lesion. 3. Mild acromioclavicular and glenohumeral osteoarthrosis. Electronically Signed   By: Awilda Metroourtnay  Bloomer M.D.   On: 04/18/2018 16:30   Dg Humerus Left  Result Date: 04/18/2018 CLINICAL DATA:  LEFT shoulder pain after fall April 13, 2018. EXAM: LEFT SHOULDER - 2+ VIEW; LEFT HUMERUS - 2+ VIEW COMPARISON:  LEFT shoulder radiograph August 12, 2013 FINDINGS: LEFT shoulder: The humeral head is located, greater tuberosity focal concavity. Mild acromioclavicular and glenohumeral osteoarthrosis. The subacromial, glenohumeral and acromioclavicular joint spaces are intact. No destructive bony lesions. Soft tissue planes are  non-suspicious. LEFT humerus: No acute fracture deformity or dislocation. No destructive bony lesions. Soft tissue planes are not suspicious. IMPRESSION: 1. No acute fracture deformity or dislocation. 2. Posterolateral humeral head deformity most compatible with Hill-Sachs lesion. 3. Mild acromioclavicular and glenohumeral osteoarthrosis. Electronically Signed   By: Awilda Metroourtnay  Bloomer M.D.   On: 04/18/2018 16:30    Procedures Procedures (including critical care time)  Medications Ordered in UC Medications - No data to display  Initial Impression / Assessment and Plan / UC Course  I have reviewed the triage vital signs and the nursing notes.  Pertinent labs & imaging results that were available during my care of the patient were reviewed by me and considered in my medical decision making (see chart for details).    Patient with bruising and tenderness to the left shoulder on top of his chronic osteoarthritis.  X-ray as above with mention of a Hill-Sachs lesion.  Upon interview, patient does report some occasional catching sensation in her shoulder.  Will give patient prescription for anti-inflammatories and some shoulder exercises.  Recommend follow-up with orthopedics if he continues to have issues.  We  will also provide him a shoulder sling for comfort.  Final Clinical Impressions(s) / UC Diagnoses   Final diagnoses:  Fall, initial encounter  Left shoulder pain, unspecified chronicity  Osteoarthritis of left shoulder, unspecified osteoarthritis type     Discharge Instructions     -Naproxen sodium: 1 tablet twice daily as needed for pain -Shoulder exercises attached -Can use warm compresses for pain -Continue your home medications.  Your gabapentin and muscle relaxer should help as well. -Should you continue to have issues with the shoulder, recommend following with orthopedics. -Left arm sling for comfort.    ED Prescriptions    Medication Sig Dispense Auth. Provider   naproxen  (NAPROSYN) 500 MG tablet Take 1 tablet (500 mg total) by mouth 2 (two) times daily. 30 tablet Candis Schatz, PA-C     Controlled Substance Prescriptions [place] Controlled Substance Registry consulted? Not Applicable   Candis Schatz, PA-C 04/18/18 1707

## 2018-04-22 ENCOUNTER — Other Ambulatory Visit: Payer: Self-pay | Admitting: Cardiovascular Disease

## 2018-04-23 ENCOUNTER — Telehealth: Payer: Self-pay | Admitting: *Deleted

## 2018-04-23 ENCOUNTER — Ambulatory Visit: Payer: Medicaid Other | Admitting: Cardiovascular Disease

## 2018-04-23 MED ORDER — METOPROLOL TARTRATE 100 MG PO TABS: 100.0000 mg | ORAL_TABLET | Freq: Two times a day (BID) | ORAL | 1 refills | Status: DC

## 2018-04-23 MED ORDER — LISINOPRIL 40 MG PO TABS: 40.0000 mg | ORAL_TABLET | Freq: Every day | ORAL | 1 refills | Status: DC

## 2018-04-23 NOTE — Telephone Encounter (Signed)
COVID-19 Pre-Screening:  1. Have you been in contact with someone who was sick?  no 2. Do you have any of the following symptoms (cough, fever, muscle pain, vomiting, diarrhea, weakness abdominal pain, rash, red eye, bruising or bleeding, joint pain, severe headache)?  no 3. Have you travelled internationally or out of state in the last month?  no   4. Do you need any refills at this time?  Yes, lisinopril and metoprolol. Rx sent to pharmacy.  Patient aware of the following: You will be contacted at a later time to reschedule. However, this will depend on ongoing evaluation of the Covid-19 situation.  Please call us if any new questions or concerns arise. We are here for advice.  Routing to COVID cancel pool.

## 2018-04-23 NOTE — Progress Notes (Incomplete)
Cardiology Office Note  Date:  04/23/2018   ID:  [name], DOB 05-16-1980, MRN [id number]  PCP:  Emogene Morgan, MD   No chief complaint on file.   HPI:  [name] is a 38 year-old gentleman with a history of  Arthrogryposis multiplex congenita,  chronic pain in his legs,  asthma,  hypertension,  diabetes, HBA1C 9.2 severe obstructive sleep apnea on CPAP since April 2012,  obesity,  seizure disorder,  psoriasis with several  evaluations in the emergency room for chest pain on April 23, 06/01/2011.  History of frequent falls, walks with leg braces and canes Prior history of atypical chest pain, often exacerbated by falls Ostomy He presents for routine followup of his hypertension and chest pain  INTERVAL HISTORY: The patient reports today for follow up.  ***  Today's Blood pressure ***/***  HBA1C 8.2 CR 1.07 Glucose 170   EKG personally reviewed by myself on todays visit Shows *** rhythm. *** bpm. ***   {Weight down 20 pounds Has changed his diet, low carbohydrates Otherwise doing well Denies any chest pain, no recent falls  Last episode of chest pain with workup June 2018 after a fall, felt to be musculoskeletal Reports blood pressure stable Compliant with medications  Biggest complaint is pain in his feet at nighttime when legs are up Pain persisted into the morning reports  he is taking gabapentin 300 mg at nighttime but does not seem to be working  Reports he is scheduled for ostomy repair later in March 2019 at [facility]  Lab work reviewed  hemoglobin A1c 9.2, had endocrine at [facility] Since then has had tremendous weight loss  EKG personally reviewed by myself on todays visit  shows normal sinus rhythm with rate 88 bpm, no significant ST or T-wave changes}  OTHER PAST MEDICAL HISTORY REVIEWED BY ME FOR TODAY'S VISIT:  He reported having Diarrhea in November 2017, Resolved Goes to [facility], for primary care He does have sleep apnea, uses  CPAP  Previously seen in the emergency room for chest pain  Pain was somewhat atypical, EKG was unchanged, cardiac enzymes negative.   Prior visits to the emergency room  at [facility] for abdominal pain. He was started on MiraLAX for constipation. emergency room for chest pain on 02/09/2012. Workup was essentially negative. Chest pain radiates to the right, the left, up to the shoulder, sometimes downward. Symptoms present at rest and with exertion.  Blood pressure has been relatively stable.    PMH:   has a past medical history of Abrasion or friction burn of foot and toe(s), without mention of infection, Acid reflux, Adopted, Allergic rhinitis, cause unspecified, Anxiety, Arthritis, Arthrogryposis, Asthma, Bladder wall thickening, BPH (benign prostatic hyperplasia), Chronic pain syndrome, Depressive disorder, not elsewhere classified, Diabetes mellitus without complication (HCC), Diverticulitis of colon (without mention of hemorrhage)(562.11), ED (erectile dysfunction), Herpes genitalis, History of echocardiogram, History of stress test, HIV infection (HCC), HTN (hypertension), Hyperlipidemia, Hypogonadism in male, Myalgia and myositis, unspecified, OAB (overactive bladder), Obesity, Other psoriasis, Premature baby, Seizure disorder (HCC), Sleep apnea, 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  PSH:    Past Surgical History:  Procedure Laterality Date   APPENDECTOMY     COLOSTOMY     CORNEAL TRANSPLANT     feet surgery     both   HAND SURGERY  left and right   leg surgery     left and right   ORTHOPEDIC SURGERY     hands, feet, knees, legs   tubes in ears     both    Current Outpatient Medications  Medication Sig Dispense Refill   acyclovir (ZOVIRAX) 400 MG tablet  Take 400 mg by mouth 2 (two) times daily.     albuterol (PROVENTIL HFA;VENTOLIN HFA) 108 (90 BASE) MCG/ACT inhaler Inhale 2 puffs into the lungs every 4 (four) hours as needed for wheezing or shortness of breath.      albuterol (PROVENTIL) (2.5 MG/3ML) 0.083% nebulizer solution Take 2.5 mg by nebulization every 4 (four) hours as needed for wheezing or shortness of breath.     alfuzosin (UROXATRAL) 10 MG 24 hr tablet Take 10 mg by mouth daily.     BIKTARVY 50-200-25 MG TABS tablet Take 1 tablet by mouth daily.  1   Buprenorphine (BUTRANS) 15 MCG/HR PTWK Place 15 mg onto the skin once a week. Saturday     buPROPion (WELLBUTRIN XL) 150 MG 24 hr tablet Take 1 tablet by mouth daily.  0   citalopram (CELEXA) 20 MG tablet Take 20 mg by mouth daily.     diclofenac sodium (VOLTAREN) 1 % GEL Apply 2-4 g topically 4 (four) times daily as needed. For pain.     dicyclomine (BENTYL) 20 MG tablet Take 20 mg by mouth every 6 (six) hours.     famotidine (PEPCID) 20 MG tablet Take 20 mg by mouth daily.      feeding supplement, GLUCERNA SHAKE, (GLUCERNA SHAKE) LIQD Take 237 mLs by mouth 4 (four) times daily.     ferrous sulfate 325 (65 FE) MG tablet Take by mouth.     finasteride (PROSCAR) 5 MG tablet Take 5 mg by mouth daily.     fluticasone (FLOVENT HFA) 110 MCG/ACT inhaler Inhale 2 puffs into the lungs 2 (two) times daily.     gabapentin (NEURONTIN) 300 MG capsule Take 300 mg by mouth at bedtime.     hydrocortisone 2.5 % cream Apply 1 application topically 2 (two) times daily as needed for itching.      insulin degludec (TRESIBA FLEXTOUCH) 100 UNIT/ML SOPN FlexTouch Pen Inject 80 Units into the skin at bedtime.      ketoconazole (NIZORAL) 2 % shampoo Apply 1 application topically 2 (two) times a week. tues and thursday     loratadine (CLARITIN) 10 MG tablet Take 10 mg by mouth daily.     magnesium oxide (MAG-OX) 400 (241.3 Mg) MG tablet Take 1 tablet (400 mg total) by mouth daily. 30 tablet 0    Melatonin 5 MG TABS Take 10 mg by mouth at bedtime as needed (sleep).     metFORMIN (GLUCOPHAGE-XR) 750 MG 24 hr tablet Take 750 mg by mouth 2 (two) times daily.     metoprolol tartrate (LOPRESSOR) 100 MG tablet Take 150 mg by mouth 2 (two) times daily.      miconazole (MICOTIN) 2 % cream Apply 1 application topically 2 (two) times daily.     mirtazapine (REMERON) 45 MG tablet Take 45 mg by mouth at bedtime.     Multiple Vitamin (MULTIVITAMIN) tablet Take 1 tablet by mouth daily.     naproxen (NAPROSYN) 500 MG tablet Take 1 tablet (500 mg total) by mouth 2 (two) times daily. 30 tablet 0   nitroGLYCERIN (NITROSTAT) 0.4 MG SL tablet DISSOLVE 1 TABLET UNDER THE TONGUE EVERY 5 MINUTES AS NEEDED FOR  CHEST PAIN, CALL 911 IF CHEST IS NOT RESOLVED WITH 3 TABLETS 25 tablet 1   nortriptyline (PAMELOR) 25 MG capsule Take 50 mg by mouth at bedtime.     NOVOLOG FLEXPEN 100 UNIT/ML FlexPen Inject 12 Units into the skin 3 (three) times daily with meals. With every meal and every snack  4   nystatin-triamcinolone (MYCOLOG II) cream Apply 1 application topically 2 (two) times daily.     omega-3 acid ethyl esters (LOVAZA) 1 g capsule Take 1 g by mouth daily.     omeprazole (PRILOSEC) 20 MG capsule Take 20 mg by mouth daily.     ondansetron (ZOFRAN-ODT) 4 MG disintegrating tablet Take 4 mg by mouth every 8 (eight) hours as needed for nausea or vomiting.     potassium chloride SA (K-DUR,KLOR-CON) 20 MEQ tablet take 1 tablet by mouth once daily if needed (Patient taking differently: take 1 tablet by mouth once daily) 90 tablet 3   prednisoLONE acetate (PRED FORTE) 1 % ophthalmic suspension Place 1 drop into the right eye daily.      solifenacin (VESICARE) 10 MG tablet Take 10 mg by mouth daily.     Spacer/Aero-Holding Chambers (AEROCHAMBER PLUS) inhaler Use as instructed 1 each 2   tiZANidine (ZANAFLEX) 4 MG capsule Take 4 mg by mouth 3 (three) times daily as needed for muscle spasms.     No  current facility-administered medications for this visit.      Allergies:   Penicillins and Erythromycin base   Social History:  The patient  reports that he quit smoking about 3 months ago. His smoking use included pipe. He quit after 0.00 years of use. He has never used smokeless tobacco. He reports previous alcohol use. He reports that he does not use drugs.   Family History:   He was adopted. Family history is unknown by patient.    Review of Systems: Review of Systems  Constitutional: Negative.   Eyes: Negative.   Respiratory: Negative.   Cardiovascular: Negative.   Gastrointestinal: Negative.   Genitourinary: Negative.   Musculoskeletal: Negative.   Neurological: Negative.   Psychiatric/Behavioral: Negative.   All other systems reviewed and are negative.    PHYSICAL EXAM: VS:  There were no vitals taken for this visit. , BMI There is no height or weight on file to calculate BMI.  Presenting in a wheelchair  Constitutional:  oriented to person, place, and time. No distress.  HENT:  Head: Grossly normal Eyes:  no discharge. No scleral icterus.  Neck: No JVD, no carotid bruits  Cardiovascular: Regular rate and rhythm, no murmurs appreciated Pulmonary/Chest: Clear to auscultation bilaterally, no wheezes or rails Abdominal: Soft.  no distension.  no tenderness.  Musculoskeletal: Normal range of motion Neurological:  normal muscle tone. Coordination normal. No atrophy Skin: Skin warm and dry Psychiatric: normal affect, pleasant   Recent Labs: 05/22/2017: Magnesium 1.7 01/17/2018: ALT 19; BUN 19; Creatinine, Ser 1.07; Hemoglobin 14.7; Platelets 266; Potassium 3.3; Sodium 137    Lipid Panel Lab Results  Component Value Date   CHOL 163 10/07/2016   HDL 26 (L) 10/07/2016   LDLCALC 112 (H) 10/07/2016   TRIG 126 10/07/2016      Wt Readings from Last 3 Encounters:  04/18/18 175 lb (79.4 kg)  04/06/18 185 lb (83.9 kg)  03/23/18 195 lb (88.5 kg)        ASSESSMENT AND PLAN:  Essential hypertension  Plan: EKG 12-Lead Blood pressure is well controlled on today's visit. No changes made  to the medications.  Class 1 obesity due to excess calories without serious comorbidity with body mass  Scheduled for ostomy repair Recent weight loss  Frequent falls Denies any recent falls  Prior falls leading to chest pain Recommended caution  Obstructive sleep apnea on CPAP Compliant with his CPAP Stable  Chest pain, unspecified type Previous history of atypical chest pain Prior chest pain after a fall, musculoskeletal  Type 2 diabetes mellitus with complication, without long-term current use of insulin (HCC) working with [facility] Hemoglobin A1c up to 9.2 managed by endocrinology [facility] Previous dietary noncompliance, improved recently  Preop cardiovascular No further testing needed, acceptable risk for GI/ostomy surgery   Total encounter time more than *** minutes  Greater than 50% was spent in counseling and coordination of care with the patient  Disposition:   F/U  *** months   No orders of the defined types were placed in this encounter.  Wilmon Arms am acting as a Neurosurgeon for Julien Nordmann, M.D., Ph.D.  {Add scribe attestation statement}  Signed, Dossie Arbour, M.D., Ph.D. 04/23/2018  [facility] [place], [place] [phone]

## 2018-05-05 ENCOUNTER — Other Ambulatory Visit: Payer: Self-pay

## 2018-05-05 ENCOUNTER — Encounter: Payer: Self-pay | Admitting: Emergency Medicine

## 2018-05-05 ENCOUNTER — Ambulatory Visit
Admission: EM | Admit: 2018-05-05 | Discharge: 2018-05-05 | Disposition: A | Discharge status: Home / Self Care | Payer: Medicaid Other | Attending: Emergency Medicine | Admitting: Emergency Medicine

## 2018-05-05 DIAGNOSIS — B9689 Other specified bacterial agents as the cause of diseases classified elsewhere: Secondary | ICD-10-CM | POA: Diagnosis not present

## 2018-05-05 DIAGNOSIS — N390 Urinary tract infection, site not specified: Secondary | ICD-10-CM | POA: Diagnosis not present

## 2018-05-05 DIAGNOSIS — R319 Hematuria, unspecified: Secondary | ICD-10-CM | POA: Insufficient documentation

## 2018-05-05 DIAGNOSIS — N481 Balanitis: Secondary | ICD-10-CM | POA: Insufficient documentation

## 2018-05-05 LAB — URINALYSIS, COMPLETE (UACMP) WITH MICROSCOPIC
Glucose, UA: 500 mg/dL — AB
Leukocytes,Ua: NEGATIVE
Nitrite: NEGATIVE
Protein, ur: >300 mg/dL — AB
RBC / HPF: >50 RBC/hpf (ref 0–5)
Specific Gravity, Urine: 1.02 (ref 1.005–1.030)
pH: 5 (ref 5.0–8.0)

## 2018-05-05 MED ORDER — FLUCONAZOLE 150 MG PO TABS: 150.0000 mg | ORAL_TABLET | Freq: Once | ORAL | 1 refills | Status: AC

## 2018-05-05 MED ORDER — PHENAZOPYRIDINE HCL 200 MG PO TABS: 200.0000 mg | ORAL_TABLET | Freq: Three times a day (TID) | ORAL | 0 refills | Status: DC | PRN

## 2018-05-05 MED ORDER — CEFDINIR 300 MG PO CAPS: 300.0000 mg | ORAL_CAPSULE | Freq: Two times a day (BID) | ORAL | 0 refills | Status: DC

## 2018-05-05 MED ORDER — IBUPROFEN 600 MG PO TABS: 600.0000 mg | ORAL_TABLET | Freq: Four times a day (QID) | ORAL | 0 refills | Status: DC | PRN

## 2018-05-05 NOTE — ED Triage Notes (Signed)
Patient c/o low back pain and dysuria that started 2 days ago. Patient states he gets frequent UTIs.

## 2018-05-05 NOTE — ED Provider Notes (Signed)
HPI  SUBJECTIVE:  Cory Campbell is a 38 y.o. male who presents with 2 days of dysuria, odorous urine.  He reports constant bilateral stabbing, sharp low back pain.  Reports nausea, but no vomiting.  He reports a burning rash on the inside of his foreskin.  He denies fevers, vomiting, body aches, abdominal, pelvic, perineal pain.  No penile discharge, itching.  No testicular or scrotal pain, erythema, swelling.  No recent antibiotics.  He is in a long-term monogamous relationship with a male who is asymptomatic.  STDs are not a concern today.  He has been taking Goody's, Tylenol and a muscle relaxant with improvement in symptoms.  Symptoms are worse with lying down.  Last dose of Goody's was within 4 to 6 hours of evaluation.  Patient with a past medical history of uncontrolled diabetes., BPH, HIV CD4 count unknown viral load undetectable, hypertension.  Had a group B strep UTI that was treated with Keflex in 2017.  Also has a history of balanitis, herpes for which he takes a daily antiviral, frequent UTIs, pyelonephritis, nephrolithiasis.  No history of gonorrhea, chlamydia, trichomonas prostatitis, epididymitis, orchitis..  PMD: Dr. Letta Pate.  Urology: Dr. Laddie Aquas at [facility].  Past Medical History:  Diagnosis Date  . Abrasion or friction burn of foot and toe(s), without mention of infection   . Acid reflux   . Adopted   . Allergic rhinitis, cause unspecified   . Anxiety   . Arthritis   . Arthrogryposis   . Asthma   . Bladder wall thickening   . BPH (benign prostatic hyperplasia)   . Chronic pain syndrome   . Depressive disorder, not elsewhere classified   . Diabetes mellitus without complication (HCC)   . Diverticulitis of colon (without mention of hemorrhage)(562.11)   . ED (erectile dysfunction)   . Herpes genitalis   . History of echocardiogram    a. 10/2016: EF 50-55%, normal wall motion  . History of stress test    a. 06/2011: significant GI uptake artifact, no evidence of ischemia,  EF 56%  . HIV infection (HCC)   . HTN (hypertension)   . Hyperlipidemia   . Hypogonadism in male   . Myalgia and myositis, unspecified   . OAB (overactive bladder)   . Obesity   . Other psoriasis   . Premature baby    born premature  . Seizure disorder (HCC)   . Sleep apnea   . 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 or other cause, without mention of thyrotoxic crisis or storm    hyperthyroidism  . TIA (transient ischemic attack)   . Type II or unspecified type diabetes mellitus with neurological manifestations, not stated as uncontrolled(250.60)     Past Surgical History:  Procedure Laterality Date  . APPENDECTOMY    . COLOSTOMY    . CORNEAL TRANSPLANT    . feet surgery     both  . HAND SURGERY     left and right  . leg surgery     left and right  . ORTHOPEDIC SURGERY     hands, feet, knees, legs  . tubes in ears     both    Family History  Adopted: Yes  Family history unknown: Yes    Social History   Tobacco Use  . Smoking status: Former Smoker    Years: 0.00    Types: Pipe    Last attempt to quit: 01/2018    Years since quitting: 0.3  . Smokeless tobacco: Never Used  Substance  Use Topics  . Alcohol use: Not Currently    Alcohol/week: 0.0 standard drinks    Comment: used to be moderate  . Drug use: No    No current facility-administered medications for this encounter.   Current Outpatient Medications:  .  acyclovir (ZOVIRAX) 400 MG tablet, Take 400 mg by mouth 2 (two) times daily., Disp: , Rfl:  .  albuterol (PROVENTIL HFA;VENTOLIN HFA) 108 (90 BASE) MCG/ACT inhaler, Inhale 2 puffs into the lungs every 4 (four) hours as needed for wheezing or shortness of breath. , Disp: , Rfl:  .  albuterol (PROVENTIL) (2.5 MG/3ML) 0.083% nebulizer solution, Take 2.5 mg by nebulization every 4 (four) hours as needed for wheezing or shortness of breath., Disp: , Rfl:  .  alfuzosin (UROXATRAL) 10 MG 24 hr tablet, Take 10 mg by mouth daily., Disp: , Rfl:  .  BIKTARVY  50-200-25 MG TABS tablet, Take 1 tablet by mouth daily., Disp: , Rfl: 1 .  Buprenorphine (BUTRANS) 15 MCG/HR PTWK, Place 15 mg onto the skin once a week. Saturday, Disp: , Rfl:  .  buPROPion (WELLBUTRIN XL) 150 MG 24 hr tablet, Take 1 tablet by mouth daily., Disp: , Rfl: 0 .  citalopram (CELEXA) 20 MG tablet, Take 20 mg by mouth daily., Disp: , Rfl:  .  diclofenac sodium (VOLTAREN) 1 % GEL, Apply 2-4 g topically 4 (four) times daily as needed. For pain., Disp: , Rfl:  .  dicyclomine (BENTYL) 20 MG tablet, Take 20 mg by mouth every 6 (six) hours., Disp: , Rfl:  .  famotidine (PEPCID) 20 MG tablet, Take 20 mg by mouth daily. , Disp: , Rfl:  .  feeding supplement, GLUCERNA SHAKE, (GLUCERNA SHAKE) LIQD, Take 237 mLs by mouth 4 (four) times daily., Disp: , Rfl:  .  ferrous sulfate 325 (65 FE) MG tablet, Take by mouth., Disp: , Rfl:  .  finasteride (PROSCAR) 5 MG tablet, Take 5 mg by mouth daily., Disp: , Rfl:  .  fluticasone (FLOVENT HFA) 110 MCG/ACT inhaler, Inhale 2 puffs into the lungs 2 (two) times daily., Disp: , Rfl:  .  gabapentin (NEURONTIN) 300 MG capsule, Take 300 mg by mouth at bedtime., Disp: , Rfl:  .  hydrocortisone 2.5 % cream, Apply 1 application topically 2 (two) times daily as needed for itching. , Disp: , Rfl:  .  insulin degludec (TRESIBA FLEXTOUCH) 100 UNIT/ML SOPN FlexTouch Pen, Inject 80 Units into the skin at bedtime. , Disp: , Rfl:  .  ketoconazole (NIZORAL) 2 % shampoo, Apply 1 application topically 2 (two) times a week. tues and thursday, Disp: , Rfl:  .  lisinopril (PRINIVIL,ZESTRIL) 40 MG tablet, Take 1 tablet (40 mg total) by mouth daily., Disp: 90 tablet, Rfl: 1 .  loratadine (CLARITIN) 10 MG tablet, Take 10 mg by mouth daily., Disp: , Rfl:  .  magnesium oxide (MAG-OX) 400 (241.3 Mg) MG tablet, Take 1 tablet (400 mg total) by mouth daily., Disp: 30 tablet, Rfl: 0 .  Melatonin 5 MG TABS, Take 10 mg by mouth at bedtime as needed (sleep)., Disp: , Rfl:  .  metFORMIN  (GLUCOPHAGE-XR) 750 MG 24 hr tablet, Take 750 mg by mouth 2 (two) times daily., Disp: , Rfl:  .  metoprolol tartrate (LOPRESSOR) 100 MG tablet, Take 1 tablet (100 mg total) by mouth 2 (two) times daily., Disp: 180 tablet, Rfl: 1 .  miconazole (MICOTIN) 2 % cream, Apply 1 application topically 2 (two) times daily., Disp: , Rfl:  .  mirtazapine (REMERON) 45 MG tablet, Take 45 mg by mouth at bedtime., Disp: , Rfl:  .  Multiple Vitamin (MULTIVITAMIN) tablet, Take 1 tablet by mouth daily., Disp: , Rfl:  .  naproxen (NAPROSYN) 500 MG tablet, Take 1 tablet (500 mg total) by mouth 2 (two) times daily., Disp: 30 tablet, Rfl: 0 .  nitroGLYCERIN (NITROSTAT) 0.4 MG SL tablet, DISSOLVE 1 TABLET UNDER THE TONGUE EVERY 5 MINUTES AS NEEDED FOR CHEST PAIN, CALL 911 IF CHEST IS NOT RESOLVED WITH 3 TABLETS, Disp: 25 tablet, Rfl: 1 .  nortriptyline (PAMELOR) 25 MG capsule, Take 50 mg by mouth at bedtime., Disp: , Rfl:  .  NOVOLOG FLEXPEN 100 UNIT/ML FlexPen, Inject 12 Units into the skin 3 (three) times daily with meals. With every meal and every snack, Disp: , Rfl: 4 .  nystatin-triamcinolone (MYCOLOG II) cream, Apply 1 application topically 2 (two) times daily., Disp: , Rfl:  .  omega-3 acid ethyl esters (LOVAZA) 1 g capsule, Take 1 g by mouth daily., Disp: , Rfl:  .  omeprazole (PRILOSEC) 20 MG capsule, Take 20 mg by mouth daily., Disp: , Rfl:  .  ondansetron (ZOFRAN-ODT) 4 MG disintegrating tablet, Take 4 mg by mouth every 8 (eight) hours as needed for nausea or vomiting., Disp: , Rfl:  .  potassium chloride SA (K-DUR,KLOR-CON) 20 MEQ tablet, take 1 tablet by mouth once daily if needed (Patient taking differently: take 1 tablet by mouth once daily), Disp: 90 tablet, Rfl: 3 .  prednisoLONE acetate (PRED FORTE) 1 % ophthalmic suspension, Place 1 drop into the right eye daily. , Disp: , Rfl:  .  solifenacin (VESICARE) 10 MG tablet, Take 10 mg by mouth daily., Disp: , Rfl:  .  Spacer/Aero-Holding Chambers (AEROCHAMBER  PLUS) inhaler, Use as instructed, Disp: 1 each, Rfl: 2 .  tiZANidine (ZANAFLEX) 4 MG capsule, Take 4 mg by mouth 3 (three) times daily as needed for muscle spasms., Disp: , Rfl:  .  cefdinir (OMNICEF) 300 MG capsule, Take 1 capsule (300 mg total) by mouth 2 (two) times daily., Disp: 20 capsule, Rfl: 0 .  fluconazole (DIFLUCAN) 150 MG tablet, Take 1 tablet (150 mg total) by mouth once for 1 dose. 1 tab po x 1. May repeat in 72 hours if no improvement, Disp: 2 tablet, Rfl: 1 .  ibuprofen (ADVIL,MOTRIN) 600 MG tablet, Take 1 tablet (600 mg total) by mouth every 6 (six) hours as needed., Disp: 30 tablet, Rfl: 0 .  phenazopyridine (PYRIDIUM) 200 MG tablet, Take 1 tablet (200 mg total) by mouth 3 (three) times daily as needed for pain., Disp: 6 tablet, Rfl: 0  Allergies  Allergen Reactions  . Penicillins Hives, Itching and Rash    Has patient had a PCN reaction causing immediate rash, facial/tongue/throat swelling, SOB or lightheadedness with hypotension: Yes Has patient had a PCN reaction causing severe rash involving mucus membranes or skin necrosis: No Has patient had a PCN reaction that required hospitalization No Has patient had a PCN reaction occurring within the last 10 years: No If all of the above answers are "NO", then may proceed with Cephalosporin use.   . Erythromycin Base Itching and Rash     ROS  As noted in HPI.   Physical Exam  BP 127/87 (BP Location: Right Arm)   Pulse 80   Temp 98.8 F (37.1 C) (Oral)   Resp 18   Ht 5\' 6"  (1.676 m)   Wt 78.9 kg   SpO2 97%   BMI  28.08 kg/m   Constitutional: Well developed, well nourished, no acute distress.  In wheelchair. Eyes:  EOMI, conjunctiva normal bilaterally HENT: Normocephalic, atraumatic,mucus membranes moist Respiratory: Normal inspiratory effort Cardiovascular: Normal rate GI: nondistended, positive suprapubic tenderness.  No flank tenderness. Back: Positive paralumbar tenderness, questionable CVAT. GU:  Uncircumcised male.  Positive tender macerated foreskin with fissures.  No vesicular rash.  No penile discharge.  Testes descended bilaterally, nontender.  No epididymal tenderness.  Patient declined chaperone. Rectal: Enlarged, firm, nontender prostate. skin: No rash, skin intact Musculoskeletal: no deformities Neurologic: Alert & oriented x 3, no focal neuro deficits Psychiatric: Speech and behavior appropriate   ED Course   Medications - No data to display  Orders Placed This Encounter  Procedures  . Urine culture    Standing Status:   Standing    Number of Occurrences:   1    Order Specific Question:   Patient immune status    Answer:   Immunocompromised  . Urinalysis, Complete w Microscopic    Standing Status:   Standing    Number of Occurrences:   1    Results for orders placed or performed during the hospital encounter of 05/05/18 (from the past 24 hour(s))  Urinalysis, Complete w Microscopic     Status: Abnormal   Collection Time: 05/05/18 11:59 AM  Result Value Ref Range   Color, Urine YELLOW YELLOW   APPearance HAZY (A) CLEAR   Specific Gravity, Urine 1.020 1.005 - 1.030   pH 5.0 5.0 - 8.0   Glucose, UA 500 (A) NEGATIVE mg/dL   Hgb urine dipstick MODERATE (A) NEGATIVE   Bilirubin Urine SMALL (A) NEGATIVE   Ketones, ur TRACE (A) NEGATIVE mg/dL   Protein, ur >161 (A) NEGATIVE mg/dL   Nitrite NEGATIVE NEGATIVE   Leukocytes,Ua NEGATIVE NEGATIVE   Squamous Epithelial / LPF 0-5 0 - 5   WBC, UA 11-20 0 - 5 WBC/hpf   RBC / HPF >50 0 - 5 RBC/hpf   Bacteria, UA RARE (A) NONE SEEN   No results found.  ED Clinical Impression  Urinary tract infection with hematuria, site unspecified  Balanitis   ED Assessment/Plan  Previous records and outside labs, records reviewed.  Patient had a positive urine culture for group B strep in 2017, other cultures available.    Creatinine has been within normal limits over 2019  Patient states that this does not feel like  nephrolithiasis.  His physical is most consistent with a UTI.  Pyelonephritis is in the  differential but he has not had any fevers.   will send him home on Omnicef for 10 days for complicated UTI.  States that he has tolerated cephalosporins before.  Pyridium for symptom control.  Also has balanitis.  Will send him home with Diflucan.  Advised him to restart the cream that he usually applies when he has a balanitis.  States he has plenty of this.  He also has some paralumbar tenderness, his back pain could be musculoskeletal, so we will send him home with ibuprofen 600 mg combined with 1 g of Tylenol 3-4 times a day, he is to discontinue the Goody powders.  He Is to follow-up with his primary care physician or urologist in several days, gave him strict ER return precautions. Discussed labs, MDM, treatment plan, and plan for follow-up with patient. Discussed sn/sx that should prompt return to the ED. patient agrees with plan.   Meds ordered this encounter  Medications  . cefdinir (OMNICEF) 300 MG capsule  Sig: Take 1 capsule (300 mg total) by mouth 2 (two) times daily.    Dispense:  20 capsule    Refill:  0  . phenazopyridine (PYRIDIUM) 200 MG tablet    Sig: Take 1 tablet (200 mg total) by mouth 3 (three) times daily as needed for pain.    Dispense:  6 tablet    Refill:  0  . ibuprofen (ADVIL,MOTRIN) 600 MG tablet    Sig: Take 1 tablet (600 mg total) by mouth every 6 (six) hours as needed.    Dispense:  30 tablet    Refill:  0  . fluconazole (DIFLUCAN) 150 MG tablet    Sig: Take 1 tablet (150 mg total) by mouth once for 1 dose. 1 tab po x 1. May repeat in 72 hours if no improvement    Dispense:  2 tablet    Refill:  1    *This clinic note was created using Scientist, clinical (histocompatibility and immunogenetics). Therefore, there may be occasional mistakes despite careful proofreading.   ?    Domenick Gong, MD 05/05/18 1328

## 2018-05-05 NOTE — Discharge Instructions (Addendum)
Diflucan for the yeast infection.  Start applying the Micatin or Mycolog as well.  This should take care of the rash.  Finish the [place] for the UTI, even if you feel better.  The Pyridium will help with your urinary symptoms.  600 mg of ibuprofen combined with 1 g of Tylenol 3-4 times a day as needed for pain.  Stop Goody's powders.

## 2018-05-07 LAB — URINE CULTURE: Culture: 30000 — AB

## 2018-05-16 ENCOUNTER — Telehealth: Payer: Self-pay

## 2018-05-16 NOTE — Telephone Encounter (Signed)
Virtual Visit Pre-Appointment Phone Call  Steps For Call:  1. Confirm consent - "In the setting of the current Covid19 crisis, you are scheduled for a TELEPHONE visit with your provider on 05/17/2018 at 4:00PM.  Just as we do with many in-office visits, in order for you to participate in this visit, we must obtain consent.  If you'd like, I can send this to your mychart (if signed up) or email for you to review.  Otherwise, I can obtain your verbal consent now.  All virtual visits are billed to your insurance company just like a normal visit would be.  By agreeing to a virtual visit, we'd like you to understand that the technology does not allow for your provider to perform an examination, and thus may limit your provider's ability to fully assess your condition.  Finally, though the technology is pretty good, we cannot assure that it will always work on either your or our end, and in the setting of a video visit, we may have to convert it to a phone-only visit.  In either situation, we cannot ensure that we have a secure connection.  Are you willing to proceed?"  2. Give patient instructions for WebEx download to smartphone as below if video visit  3. Advise patient to be prepared with any vital sign or heart rhythm information, their current medicines, and a piece of paper and pen handy for any instructions they may receive the day of their visit  4. Inform patient they will receive a phone call 15 minutes prior to their appointment time (may be from unknown caller ID) so they should be prepared to answer  5. Confirm that appointment type is correct in Epic appointment notes (video vs telephone)    TELEPHONE CALL NOTE  Cory Campbell has been deemed a candidate for a follow-up tele-health visit to limit community exposure during the Covid-19 pandemic. I spoke with the patient via phone to ensure availability of phone/video source, confirm preferred email & phone number, and discuss  instructions and expectations.  I reminded Cory Campbell to be prepared with any vital sign and/or heart rhythm information that could potentially be obtained via home monitoring, at the time of his visit. I reminded Cory Campbell to expect a phone call at the time of his visit if his visit.  Did the patient verbally acknowledge consent to treatment? YES  Margrett Rud, CMA 05/16/2018 11:08 AM   DOWNLOADING THE WEBEX SOFTWARE TO SMARTPHONE  - If Apple, go to [employer] and type in WebEx in the search bar. Download [employer], the blue/green circle. The app is free but as with any other app downloads, their phone may require them to verify saved payment information or Apple password. The patient does NOT have to create an account.  - If Android, ask patient to go to [employer] and type in WebEx in the search bar. Download [employer], the blue/green circle. The app is free but as with any other app downloads, their phone may require them to verify saved payment information or Android password. The patient does NOT have to create an account.   CONSENT FOR TELE-HEALTH VISIT - PLEASE REVIEW  I hereby voluntarily request, consent and authorize [facility] and its employed or contracted physicians, physician assistants, nurse practitioners or other licensed health care professionals (the Practitioner), to provide me with telemedicine health care services (the "Services") as deemed necessary by the treating Practitioner. I acknowledge  and consent to receive the Services by the Practitioner via telemedicine. I understand that the telemedicine visit will involve communicating with the Practitioner through live audiovisual communication technology and the disclosure of certain medical information by electronic transmission. I acknowledge that I have been given the opportunity to request an in-person assessment or other available alternative prior to the telemedicine visit  and am voluntarily participating in the telemedicine visit.  I understand that I have the right to withhold or withdraw my consent to the use of telemedicine in the course of my care at any time, without affecting my right to future care or treatment, and that the Practitioner or I may terminate the telemedicine visit at any time. I understand that I have the right to inspect all information obtained and/or recorded in the course of the telemedicine visit and may receive copies of available information for a reasonable fee.  I understand that some of the potential risks of receiving the Services via telemedicine include:  Marland Kitchen Delay or interruption in medical evaluation due to technological equipment failure or disruption; . Information transmitted may not be sufficient (e.g. poor resolution of images) to allow for appropriate medical decision making by the Practitioner; and/or  . In rare instances, security protocols could fail, causing a breach of personal health information.  Furthermore, I acknowledge that it is my responsibility to provide information about my medical history, conditions and care that is complete and accurate to the best of my ability. I acknowledge that Practitioner's advice, recommendations, and/or decision may be based on factors not within their control, such as incomplete or inaccurate data provided by me or distortions of diagnostic images or specimens that may result from electronic transmissions. I understand that the practice of medicine is not an exact science and that Practitioner makes no warranties or guarantees regarding treatment outcomes. I acknowledge that I will receive a copy of this consent concurrently upon execution via email to the email address I last provided but may also request a printed copy by calling the office of [facility].    I understand that my insurance will be billed for this visit.   I have read or had this consent read to me. . I understand  the contents of this consent, which adequately explains the benefits and risks of the Services being provided via telemedicine.  . I have been provided ample opportunity to ask questions regarding this consent and the Services and have had my questions answered to my satisfaction. . I give my informed consent for the services to be provided through the use of telemedicine in my medical care  By participating in this telemedicine visit I agree to the above.

## 2018-05-16 NOTE — Telephone Encounter (Signed)
Patient scheduled for a telephone visit 05/16/2018

## 2018-05-17 ENCOUNTER — Telehealth (INDEPENDENT_AMBULATORY_CARE_PROVIDER_SITE_OTHER): Payer: Medicaid Other | Admitting: Cardiovascular Disease

## 2018-05-17 ENCOUNTER — Other Ambulatory Visit: Payer: Self-pay

## 2018-05-17 DIAGNOSIS — Z9989 Dependence on other enabling machines and devices: Secondary | ICD-10-CM

## 2018-05-17 DIAGNOSIS — E782 Mixed hyperlipidemia: Secondary | ICD-10-CM

## 2018-05-17 DIAGNOSIS — R296 Repeated falls: Secondary | ICD-10-CM | POA: Diagnosis not present

## 2018-05-17 DIAGNOSIS — R0602 Shortness of breath: Secondary | ICD-10-CM

## 2018-05-17 DIAGNOSIS — I10 Essential (primary) hypertension: Secondary | ICD-10-CM

## 2018-05-17 DIAGNOSIS — E118 Type 2 diabetes mellitus with unspecified complications: Secondary | ICD-10-CM

## 2018-05-17 DIAGNOSIS — G4733 Obstructive sleep apnea (adult) (pediatric): Secondary | ICD-10-CM

## 2018-05-17 DIAGNOSIS — R079 Chest pain, unspecified: Secondary | ICD-10-CM

## 2018-05-17 MED ORDER — ROSUVASTATIN CALCIUM 40 MG PO TABS: 40.0000 mg | ORAL_TABLET | Freq: Every day | ORAL | 3 refills | Status: DC

## 2018-05-17 NOTE — Progress Notes (Addendum)
Virtual Visit via Video Note   This visit type was conducted due to national recommendations for restrictions regarding the COVID-19 Pandemic (e.g. social distancing) in an effort to limit this patient's exposure and mitigate transmission in our community.  Due to her co-morbid illnesses, this patient is at least at moderate risk for complications without adequate follow up.  This format is felt to be most appropriate for this patient at this time.  All issues noted in this document were discussed and addressed.  A limited physical exam was performed with this format.  Please refer to the patient's chart for her consent to telehealth for [facility].    Date:  05/17/2018   ID:  Cory Campbell, DOB [birth year], MRN [id number]  Patient Location:  [address] [facility] [place] [address]   Provider location:   [facility], [place] office  PCP:  Emogene Morgan, MD  Cardiologist:  Fonnie Mu  Chief Complaint:  Falls, shoulder injury    History of Present Illness:    Cory Campbell is a 38 y.o. male who presents via audio/video conferencing for a telehealth visit today.   The patient does not symptoms concerning for COVID-19 infection (fever, chills, cough, or new SHORTNESS OF BREATH).   Patient has a past medical history of Arthrogryposis multiplex congenita,  chronic pain in his legs,  asthma,  hypertension,  diabetes, HBA1C 9.2 severe obstructive sleep apnea on CPAP since April 2012,  obesity,  seizure disorder,  psoriasis with several evaluations in the emergency room for chest pain on April 23, 06/01/2011.  History of frequent falls, walks with leg braces and canes Prior history of atypical chest pain, often exacerbated by falls Ostomy He presents for routine followup of his hypertension and chest pain, poorly controlled diabetes  Recent trip to the emergency room May 05, 2018 Urinary tract infection Bilateral low back pain Treated with Pyridium,  antibiotics, Diflucan Feeling better now  Fall April 13, 2018 shoulder injury, seen in the ER shoulder pain Shoulder is doing better  Seen in the ER January 17, 2018 diarrhea abdominal pain Symptoms better  Pain better in the feet, meds help  Metoprolol and lisinopril, blood pressure well controlled  Was previously on pravastatin 80 mg daily with reasonably controlled lipids Reports pravastatin was stopped around the time of his prior surgery in 2019 ostomy repair later in March 2019 at [facility] Was never restarted  Recent lab work reviewed showing hemoglobin A1c 8.0, LDL 200 up from 90  Continues to try to modify his diet On prior office visit was down 20 pounds but now reports weight has stabilized 160  Denies any chest pain on exertion Previous chest pain symptoms felt to be musculoskeletal from falls and injury   Prior CV studies:   The following studies were reviewed today:    Past Medical History:  Diagnosis Date   Abrasion or friction burn of foot and toe(s), without mention of infection    Acid reflux    Adopted    Allergic rhinitis, cause unspecified    Anxiety    Arthritis    Arthrogryposis    Asthma    Bladder wall thickening    BPH (benign prostatic hyperplasia)    Chronic pain syndrome    Depressive disorder, not elsewhere classified    Diabetes mellitus without complication (HCC)    Diverticulitis of colon (without mention of hemorrhage)(562.11)    ED (erectile dysfunction)    Herpes genitalis  History of echocardiogram    a. 10/2016: EF 50-55%, normal wall motion   History of stress test    a. 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 other cause, without mention of thyrotoxic crisis or storm    hyperthyroidism   TIA (transient ischemic attack)    Type II or unspecified type diabetes mellitus with neurological manifestations, not stated as uncontrolled(250.60)    Past Surgical History:  Procedure Laterality Date   APPENDECTOMY     COLOSTOMY     CORNEAL TRANSPLANT     feet surgery     both   HAND SURGERY     left and right   leg surgery     left and right   ORTHOPEDIC SURGERY     hands, feet, knees, legs   tubes in ears     both     No outpatient medications have been marked as taking for the 05/17/18 encounter (Appointment) with Antonieta Iba, MD.     Allergies:   Penicillins and Erythromycin base   Social History   Tobacco Use   Smoking status: Former Smoker    Years: 0.00    Types: Pipe    Last attempt to quit: 01/2018    Years since quitting: 0.3   Smokeless tobacco: Never Used  Substance Use Topics   Alcohol use: Not Currently    Alcohol/week: 0.0 standard drinks    Comment: used to be moderate   Drug use: No     Current Outpatient Medications on File Prior to Visit  Medication Sig Dispense Refill   acyclovir (ZOVIRAX) 400 MG tablet Take 400 mg by mouth 2 (two) times daily.     albuterol (PROVENTIL HFA;VENTOLIN HFA) 108 (90 BASE) MCG/ACT inhaler Inhale 2 puffs into the lungs every 4 (four) hours as needed for wheezing or shortness of breath.      albuterol (PROVENTIL) (2.5 MG/3ML) 0.083% nebulizer solution Take 2.5 mg by nebulization every 4 (four) hours as needed for wheezing or shortness of breath.     alfuzosin (UROXATRAL) 10 MG 24 hr tablet Take 10 mg by mouth daily.     BIKTARVY 50-200-25 MG TABS tablet Take 1 tablet by mouth daily.  1   Buprenorphine (BUTRANS) 15 MCG/HR PTWK Place 15 mg onto the skin once a week. Saturday     buPROPion (WELLBUTRIN XL) 150 MG 24 hr tablet Take 1 tablet by mouth daily.  0    cefdinir (OMNICEF) 300 MG capsule Take 1 capsule (300 mg total) by mouth 2 (two) times daily. 20 capsule 0   citalopram (CELEXA) 20 MG tablet Take 20 mg by mouth daily.     diclofenac sodium (VOLTAREN) 1 % GEL Apply 2-4 g topically 4 (four) times daily as needed. For pain.     dicyclomine (BENTYL) 20 MG tablet Take 20 mg by mouth every 6 (six) hours.     famotidine (PEPCID) 20 MG tablet Take 20 mg by mouth daily.      feeding supplement, GLUCERNA SHAKE, (GLUCERNA SHAKE) LIQD Take 237 mLs by  mouth 4 (four) times daily.     ferrous sulfate 325 (65 FE) MG tablet Take by mouth.     finasteride (PROSCAR) 5 MG tablet Take 5 mg by mouth daily.     fluticasone (FLOVENT HFA) 110 MCG/ACT inhaler Inhale 2 puffs into the lungs 2 (two) times daily.     gabapentin (NEURONTIN) 300 MG capsule Take 300 mg by mouth at bedtime.     hydrocortisone 2.5 % cream Apply 1 application topically 2 (two) times daily as needed for itching.      ibuprofen (ADVIL,MOTRIN) 600 MG tablet Take 1 tablet (600 mg total) by mouth every 6 (six) hours as needed. 30 tablet 0   insulin degludec (TRESIBA FLEXTOUCH) 100 UNIT/ML SOPN FlexTouch Pen Inject 80 Units into the skin at bedtime.      ketoconazole (NIZORAL) 2 % shampoo Apply 1 application topically 2 (two) times a week. tues and thursday     lisinopril (PRINIVIL,ZESTRIL) 40 MG tablet Take 1 tablet (40 mg total) by mouth daily. 90 tablet 1   loratadine (CLARITIN) 10 MG tablet Take 10 mg by mouth daily.     magnesium oxide (MAG-OX) 400 (241.3 Mg) MG tablet Take 1 tablet (400 mg total) by mouth daily. 30 tablet 0   Melatonin 5 MG TABS Take 10 mg by mouth at bedtime as needed (sleep).     metFORMIN (GLUCOPHAGE-XR) 750 MG 24 hr tablet Take 750 mg by mouth 2 (two) times daily.     metoprolol tartrate (LOPRESSOR) 100 MG tablet Take 1 tablet (100 mg total) by mouth 2 (two) times daily. 180 tablet 1   miconazole (MICOTIN) 2 % cream Apply 1 application topically 2 (two)  times daily.     mirtazapine (REMERON) 45 MG tablet Take 45 mg by mouth at bedtime.     Multiple Vitamin (MULTIVITAMIN) tablet Take 1 tablet by mouth daily.     naproxen (NAPROSYN) 500 MG tablet Take 1 tablet (500 mg total) by mouth 2 (two) times daily. 30 tablet 0   nitroGLYCERIN (NITROSTAT) 0.4 MG SL tablet DISSOLVE 1 TABLET UNDER THE TONGUE EVERY 5 MINUTES AS NEEDED FOR CHEST PAIN, CALL 911 IF CHEST IS NOT RESOLVED WITH 3 TABLETS 25 tablet 1   nortriptyline (PAMELOR) 25 MG capsule Take 50 mg by mouth at bedtime.     NOVOLOG FLEXPEN 100 UNIT/ML FlexPen Inject 12 Units into the skin 3 (three) times daily with meals. With every meal and every snack  4   nystatin-triamcinolone (MYCOLOG II) cream Apply 1 application topically 2 (two) times daily.     omega-3 acid ethyl esters (LOVAZA) 1 g capsule Take 1 g by mouth daily.     omeprazole (PRILOSEC) 20 MG capsule Take 20 mg by mouth daily.     ondansetron (ZOFRAN-ODT) 4 MG disintegrating tablet Take 4 mg by mouth every 8 (eight) hours as needed for nausea or vomiting.     phenazopyridine (PYRIDIUM) 200 MG tablet Take 1 tablet (200 mg total) by mouth 3 (three) times daily as needed for pain. 6 tablet 0   potassium chloride SA (K-DUR,KLOR-CON) 20 MEQ tablet take 1 tablet by mouth once daily if needed (Patient taking differently: take 1 tablet by mouth once daily) 90 tablet 3   prednisoLONE acetate (PRED FORTE) 1 % ophthalmic suspension Place 1 drop into the right eye daily.      solifenacin (VESICARE) 10 MG tablet Take 10 mg by mouth daily.     Spacer/Aero-Holding Chambers (AEROCHAMBER PLUS) inhaler Use  as instructed 1 each 2   tiZANidine (ZANAFLEX) 4 MG capsule Take 4 mg by mouth 3 (three) times daily as needed for muscle spasms.     No current facility-administered medications on file prior to visit.      Family Hx: The patient's He was adopted. Family history is unknown by patient.  ROS:   Please see the history of present  illness.    Review of Systems  Constitutional: Negative.   Respiratory: Negative.   Cardiovascular: Negative.   Gastrointestinal: Negative.   Musculoskeletal: Negative.        Falls, leg weakness  Neurological: Negative.   Psychiatric/Behavioral: Negative.   All other systems reviewed and are negative.    Labs/Other Tests and Data Reviewed:    Recent Labs: 05/22/2017: Magnesium 1.7 01/17/2018: ALT 19; BUN 19; Creatinine, Ser 1.07; Hemoglobin 14.7; Platelets 266; Potassium 3.3; Sodium 137   Recent Lipid Panel Lab Results  Component Value Date/Time   CHOL 163 10/07/2016 05:03 AM   TRIG 126 10/07/2016 05:03 AM   HDL 26 (L) 10/07/2016 05:03 AM   CHOLHDL 6.3 10/07/2016 05:03 AM   LDLCALC 112 (H) 10/07/2016 05:03 AM    Wt Readings from Last 3 Encounters:  05/05/18 174 lb (78.9 kg)  04/18/18 175 lb (79.4 kg)  04/06/18 185 lb (83.9 kg)     Exam:    Vital Signs: Vital signs may also be detailed in the HPI There were no vitals taken for this visit.  Wt Readings from Last 3 Encounters:  05/05/18 174 lb (78.9 kg)  04/18/18 175 lb (79.4 kg)  04/06/18 185 lb (83.9 kg)   Temp Readings from Last 3 Encounters:  05/05/18 98.8 F (37.1 C) (Oral)  04/18/18 98.1 F (36.7 C) (Oral)  04/06/18 98.2 F (36.8 C) (Oral)   BP Readings from Last 3 Encounters:  05/05/18 127/87  04/18/18 (!) 133/97  04/06/18 (!) 135/99   Pulse Readings from Last 3 Encounters:  05/05/18 80  04/18/18 92  04/06/18 100     Vitals today: 130/90  Pulse 80 Respirations 16  Well nourished, well developed male in no acute distress. Constitutional:  oriented to person, place, and time. No distress.    ASSESSMENT & PLAN:    Type 2 diabetes mellitus with complication, without long-term current use of insulin (HCC) Slight improvement in his A1c from 9 down to 8 Recommend he continue aggressive diet modification Numbers discussed with him  Chest pain, unspecified type Previous history of  musculoskeletal chest pain, For risk factor modification we will restart statin  Frequent falls Long history of falls, injury  Essential hypertension Blood pressure reasonably well controlled on lisinopril and metoprolol No changes made  Obstructive sleep apnea on CPAP Stressed compliance with his CPAP  Shortness of breath Sedentary at baseline No regular exercise program  Hyperlipidemia Markedly elevated LDL after pravastatin held Reports that he has not tried other statins We will recommend he start Crestor 40 mg daily Suggested he call us for side effects such as myalgias   COVID-19 Education: The signs and symptoms of COVID-19 were discussed with the patient and how to seek care for testing (follow up with PCP or arrange E-visit).  The importance of social distancing was discussed today.  Patient Risk:   After full review of this patients clinical status, I feel that they are at least moderate risk at this time.  Time:   Today, I have spent 25 minutes with the patient with telehealth technology discussing the cardiac  and medical problems/diagnoses detailed above   Long discussion concerning his risks as a diabetic,  need for aggressive lipid management   Medication Adjustments/Labs and Tests Ordered: Current medicines are reviewed at length with the patient today.  Concerns regarding medicines are outlined above.   Tests Ordered: Lipids and LFTs in 3 months This can be done through primary care   Medication Changes: Start Crestor as above   Disposition: Follow-up in 12 months   Signed, Julien Nordmann, MD  05/17/2018 4:07 PM    [facility] [address], [place], [place] [address]

## 2018-05-17 NOTE — Patient Instructions (Addendum)
Medication Instructions:  Your physician has recommended you make the following change in your medication:  1. START Rosuvastatin (Crestor) 40 mg once daily  Lipid panel LFTs in 3 months (okay to do at [facility] with endocrine)  If you need a refill on your cardiac medications before your next appointment, please call your pharmacy.   Lab work: No new labs needed   If you have labs (blood work) drawn today and your tests are completely normal, you will receive your results only by: Marland Kitchen MyChart Message (if you have MyChart) OR . A paper copy in the mail If you have any lab test that is abnormal or we need to change your treatment, we will call you to review the results.   Testing/Procedures: No new testing needed   Follow-Up: At [facility], you and your health needs are our priority.  As part of our continuing mission to provide you with exceptional heart care, we have created designated Provider Care Teams.  These Care Teams include your primary Cardiologist (physician) and Advanced Practice Providers (APPs -  Physician Assistants and Nurse Practitioners) who all work together to provide you with the care you need, when you need it.  . You will need a follow up appointment in 12 months .   Please call our office 2 months in advance to schedule this appointment.    . Providers on your designated Care Team:   . Nicolasa Ducking, NP . Eula Listen, PA-C . Marisue Ivan, PA-C  Any Other Special Instructions Will Be Listed Below (If Applicable).  For educational health videos Log in to : [email or web address] Or : [email or web address], password : [email or web address]

## 2018-05-20 NOTE — Progress Notes (Signed)
Faxed to Lovie Chol at [facility]

## 2018-05-27 ENCOUNTER — Other Ambulatory Visit: Payer: Self-pay | Admitting: Cardiovascular Disease

## 2018-05-27 NOTE — Telephone Encounter (Signed)
patietn seen 4/10

## 2018-05-27 NOTE — Telephone Encounter (Signed)
Pt overdue for 12 month f/u please contact pt to schedule future appointment.

## 2018-05-29 ENCOUNTER — Encounter: Payer: Self-pay | Admitting: Emergency Medicine

## 2018-05-29 ENCOUNTER — Other Ambulatory Visit: Payer: Self-pay

## 2018-05-29 ENCOUNTER — Ambulatory Visit
Admission: EM | Admit: 2018-05-29 | Discharge: 2018-05-29 | Disposition: A | Discharge status: Home / Self Care | Payer: Medicaid Other | Attending: Family Medicine | Admitting: Family Medicine

## 2018-05-29 DIAGNOSIS — L03115 Cellulitis of right lower limb: Secondary | ICD-10-CM

## 2018-05-29 DIAGNOSIS — W57XXXA Bitten or stung by nonvenomous insect and other nonvenomous arthropods, initial encounter: Secondary | ICD-10-CM | POA: Diagnosis not present

## 2018-05-29 DIAGNOSIS — S80861A Insect bite (nonvenomous), right lower leg, initial encounter: Secondary | ICD-10-CM

## 2018-05-29 MED ORDER — DOXYCYCLINE HYCLATE 100 MG PO TABS: 100.0000 mg | ORAL_TABLET | Freq: Two times a day (BID) | ORAL | 0 refills | Status: DC

## 2018-05-29 NOTE — ED Triage Notes (Signed)
Patient c/o ant bite to right upper leg on Sunday.

## 2018-05-29 NOTE — ED Provider Notes (Signed)
[facility]    CSN: [id number] Arrival date & time: 05/29/18  1213     History   Chief Complaint Chief Complaint  Patient presents with  . Insect Bite    HPI Cory Campbell is a 38 y.o. male.   38 yo male with a c/o an ant bite to his right thigh and now area red and tender. Denies any fevers, chills or drainage.  The history is provided by the patient.    Past Medical History:  Diagnosis Date  . Abrasion or friction burn of foot and toe(s), without mention of infection   . Acid reflux   . Adopted   . Allergic rhinitis, cause unspecified   . Anxiety   . Arthritis   . Arthrogryposis   . Asthma   . Bladder wall thickening   . BPH (benign prostatic hyperplasia)   . Chronic pain syndrome   . Depressive disorder, not elsewhere classified   . Diabetes mellitus without complication (HCC)   . Diverticulitis of colon (without mention of hemorrhage)(562.11)   . ED (erectile dysfunction)   . Herpes genitalis   . History of echocardiogram    a. 10/2016: EF 50-55%, normal wall motion  . History of stress test    a. 06/2011: significant GI uptake artifact, no evidence of ischemia, EF 56%  . HIV infection (HCC)   . HTN (hypertension)   . Hyperlipidemia   . Hypogonadism in male   . Myalgia and myositis, unspecified   . OAB (overactive bladder)   . Obesity   . Other psoriasis   . Premature baby    born premature  . Seizure disorder (HCC)   . Sleep apnea   . 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 or other cause, without mention of thyrotoxic crisis or storm    hyperthyroidism  . TIA (transient ischemic attack)   . Type II or unspecified type diabetes mellitus with neurological manifestations, not stated as uncontrolled(250.60)     Patient Active Problem List   Diagnosis Date Noted  . Surgical wound, non healing   . Acute colitis 05/20/2017  . Colitis   . Acute renal failure (HCC) 05/15/2017  . Diarrhea 05/15/2017  . Sepsis (HCC) 05/15/2017  .  Abdominal pain 11/25/2016  . Hypotension 10/08/2016  . Unstable angina (HCC) 10/06/2016  . Shortness of breath 06/23/2015  . Obesity 05/18/2014  . Frequent falls 08/12/2013  . Pain of left clavicle 08/12/2013  . Tachycardia 01/21/2013  . Obstructive sleep apnea on CPAP 06/07/2012  . Chest pain 06/20/2011  . Uncontrolled hypertension 06/20/2011  . Type 2 diabetes mellitus with complication, without long-term current use of insulin (HCC) 06/20/2011    Past Surgical History:  Procedure Laterality Date  . APPENDECTOMY    . COLOSTOMY    . CORNEAL TRANSPLANT    . feet surgery     both  . HAND SURGERY     left and right  . leg surgery     left and right  . ORTHOPEDIC SURGERY     hands, feet, knees, legs  . tubes in ears     both       Home Medications    Prior to Admission medications   Medication Sig Start Date End Date Taking? Authorizing Provider  acyclovir (ZOVIRAX) 400 MG tablet Take 400 mg by mouth 2 (two) times daily.   Yes [provider]  albuterol (PROVENTIL HFA;VENTOLIN HFA) 108 (90 BASE) MCG/ACT inhaler Inhale 2 puffs into the lungs every  4 (four) hours as needed for wheezing or shortness of breath.    Yes [provider]  albuterol (PROVENTIL) (2.5 MG/3ML) 0.083% nebulizer solution Take 2.5 mg by nebulization every 4 (four) hours as needed for wheezing or shortness of breath.   Yes [provider]  alfuzosin (UROXATRAL) 10 MG 24 hr tablet Take 10 mg by mouth daily.   Yes [provider]  BIKTARVY 50-200-25 MG TABS tablet Take 1 tablet by mouth daily. 11/21/16  Yes [provider]  Buprenorphine (BUTRANS) 15 MCG/HR PTWK Place 15 mg onto the skin once a week. Saturday   Yes [provider]  buPROPion (WELLBUTRIN XL) 150 MG 24 hr tablet Take 1 tablet by mouth daily. 11/16/16  Yes [provider]  cefdinir (OMNICEF) 300 MG capsule Take 1 capsule (300 mg total) by mouth 2 (two) times daily. 05/05/18  Yes  Domenick Gong, MD  citalopram (CELEXA) 20 MG tablet Take 20 mg by mouth daily.   Yes [provider]  diclofenac sodium (VOLTAREN) 1 % GEL Apply 2-4 g topically 4 (four) times daily as needed. For pain.   Yes [provider]  dicyclomine (BENTYL) 20 MG tablet Take 20 mg by mouth every 6 (six) hours.   Yes [provider]  famotidine (PEPCID) 20 MG tablet Take 20 mg by mouth daily.  01/03/16  Yes [provider]  feeding supplement, GLUCERNA SHAKE, (GLUCERNA SHAKE) LIQD Take 237 mLs by mouth 4 (four) times daily.   Yes [provider]  ferrous sulfate 325 (65 FE) MG tablet Take by mouth.   Yes [provider]  finasteride (PROSCAR) 5 MG tablet Take 5 mg by mouth daily.   Yes [provider]  fluticasone (FLOVENT HFA) 110 MCG/ACT inhaler Inhale 2 puffs into the lungs 2 (two) times daily.   Yes [provider]  gabapentin (NEURONTIN) 300 MG capsule Take 300 mg by mouth at bedtime.   Yes [provider]  hydrocortisone 2.5 % cream Apply 1 application topically 2 (two) times daily as needed for itching.    Yes [provider]  ibuprofen (ADVIL,MOTRIN) 600 MG tablet Take 1 tablet (600 mg total) by mouth every 6 (six) hours as needed. 05/05/18  Yes Domenick Gong, MD  insulin degludec (TRESIBA FLEXTOUCH) 100 UNIT/ML SOPN FlexTouch Pen Inject 80 Units into the skin at bedtime.    Yes [provider]  ketoconazole (NIZORAL) 2 % shampoo Apply 1 application topically 2 (two) times a week. tues and thursday 08/27/14  Yes [provider]  lisinopril (PRINIVIL,ZESTRIL) 40 MG tablet Take 1 tablet (40 mg total) by mouth daily. 04/23/18 07/22/18 Yes Gollan, Tollie Pizza, MD  loratadine (CLARITIN) 10 MG tablet Take 10 mg by mouth daily.   Yes [provider]  magnesium oxide (MAG-OX) 400 (241.3 Mg) MG tablet Take 1 tablet (400 mg total) by mouth daily. 05/23/17  Yes Salary, Evelena Asa, MD  Melatonin 5 MG  TABS Take 10 mg by mouth at bedtime as needed (sleep).   Yes [provider]  metFORMIN (GLUCOPHAGE-XR) 750 MG 24 hr tablet Take 750 mg by mouth 2 (two) times daily.   Yes [provider]  metoprolol tartrate (LOPRESSOR) 100 MG tablet Take 1 tablet (100 mg total) by mouth 2 (two) times daily. 04/23/18  Yes Gollan, Tollie Pizza, MD  miconazole (MICOTIN) 2 % cream Apply 1 application topically 2 (two) times daily.   Yes [provider]  mirtazapine (REMERON) 45 MG tablet Take  45 mg by mouth at bedtime.   Yes [provider]  Multiple Vitamin (MULTIVITAMIN) tablet Take 1 tablet by mouth daily.   Yes [provider]  naproxen (NAPROSYN) 500 MG tablet Take 1 tablet (500 mg total) by mouth 2 (two) times daily. 04/18/18  Yes Candis Schatz, PA-C  nitroGLYCERIN (NITROSTAT) 0.4 MG SL tablet DISSOLVE 1 TABLET UNDER THE TONGUE EVERY 5 MINUTES AS NEEDED FOR CHEST PAIN. IF AFTER 3 DOSES, NO RELIEF, CALL 911 05/27/18  Yes Gollan, Tollie Pizza, MD  nortriptyline (PAMELOR) 25 MG capsule Take 50 mg by mouth at bedtime.   Yes [provider]  NOVOLOG FLEXPEN 100 UNIT/ML FlexPen Inject 12 Units into the skin 3 (three) times daily with meals. With every meal and every snack 12/13/15  Yes [provider]  nystatin-triamcinolone (MYCOLOG II) cream Apply 1 application topically 2 (two) times daily.   Yes [provider]  omega-3 acid ethyl esters (LOVAZA) 1 g capsule Take 1 g by mouth daily.   Yes [provider]  omeprazole (PRILOSEC) 20 MG capsule Take 20 mg by mouth daily.   Yes [provider]  ondansetron (ZOFRAN-ODT) 4 MG disintegrating tablet Take 4 mg by mouth every 8 (eight) hours as needed for nausea or vomiting.   Yes [provider]  phenazopyridine (PYRIDIUM) 200 MG tablet Take 1 tablet (200 mg total) by mouth 3 (three) times daily as needed for pain. 05/05/18  Yes Domenick Gong, MD  potassium chloride SA  (K-DUR,KLOR-CON) 20 MEQ tablet take 1 tablet by mouth once daily if needed Patient taking differently: take 1 tablet by mouth once daily 02/08/16  Yes Gollan, Tollie Pizza, MD  prednisoLONE acetate (PRED FORTE) 1 % ophthalmic suspension Place 1 drop into the right eye daily.    Yes [provider]  rosuvastatin (CRESTOR) 40 MG tablet Take 1 tablet (40 mg total) by mouth daily. 05/17/18 08/15/18 Yes Gollan, Tollie Pizza, MD  solifenacin (VESICARE) 10 MG tablet Take 10 mg by mouth daily.   Yes [provider]  Spacer/Aero-Holding Chambers (AEROCHAMBER PLUS) inhaler Use as instructed 03/23/18  Yes Domenick Gong, MD  tiZANidine (ZANAFLEX) 4 MG capsule Take 4 mg by mouth 3 (three) times daily as needed for muscle spasms.   Yes [provider]  doxycycline (VIBRA-TABS) 100 MG tablet Take 1 tablet (100 mg total) by mouth 2 (two) times daily. 05/29/18   [name], MD    Family History Family History  Adopted: Yes  Family history unknown: Yes    Social History Social History   Tobacco Use  . Smoking status: Former Smoker    Years: 0.00    Types: Pipe    Last attempt to quit: 01/2018    Years since quitting: 0.3  . Smokeless tobacco: Never Used  Substance Use Topics  . Alcohol use: Not Currently    Alcohol/week: 0.0 standard drinks    Comment: used to be moderate  . Drug use: No     Allergies   Penicillins and Erythromycin base   Review of Systems Review of Systems   Physical Exam Triage Vital Signs ED Triage Vitals  Enc Vitals Group     BP 05/29/18 1235 (!) 134/98     Pulse Rate 05/29/18 1235 98     Resp 05/29/18 1235 18     Temp 05/29/18 1235 99.2 F (37.3 C)     Temp Source 05/29/18 1235 Oral     SpO2 05/29/18 1235 98 %  Weight 05/29/18 1233 185 lb (83.9 kg)     Height 05/29/18 1233  (1.676 m)     Head Circumference --      Peak Flow --      Pain Score 05/29/18 1233 0     Pain Loc --      Pain Edu? --      Excl. in GC? --    No  data found.  Updated Vital Signs BP (!) 134/98 (BP Location: Right Arm)   Pulse 98   Temp 99.2 F (37.3 C) (Oral)   Resp 18   Ht  (1.676 m)   Wt 83.9 kg   SpO2 98%   BMI 29.86 kg/m   Visual Acuity Right Eye Distance:   Left Eye Distance:   Bilateral Distance:    Right Eye Near:   Left Eye Near:    Bilateral Near:     Physical Exam Vitals signs and nursing note reviewed.  Constitutional:      General: He is not in acute distress.    Appearance: He is not toxic-appearing or diaphoretic.  Skin:    Comments: Right upper thigh skin area 3x3cm with blanchable erythema, warmth and tenderness to palpation; no fluctuance or drainage  Neurological:     Mental Status: He is alert.      UC Treatments / Results  Labs (all labs ordered are listed, but only abnormal results are displayed) Labs Reviewed - No data to display  EKG None  Radiology No results found.  Procedures Procedures (including critical care time)  Medications Ordered in UC Medications - No data to display  Initial Impression / Assessment and Plan / UC Course  I have reviewed the triage vital signs and the nursing notes.  Pertinent labs & imaging results that were available during my care of the patient were reviewed by me and considered in my medical decision making (see chart for details).      Final Clinical Impressions(s) / UC Diagnoses   Final diagnoses:  Insect bite of right lower extremity, initial encounter  Cellulitis of right lower extremity     Discharge Instructions     Warm compresses to area Elevate extremity    ED Prescriptions    Medication Sig Dispense Auth. Provider   doxycycline (VIBRA-TABS) 100 MG tablet Take 1 tablet (100 mg total) by mouth 2 (two) times daily. 20 tablet [name], MD     1. diagnosis reviewed with patient 2. rx as per orders above; reviewed possible side effects, interactions, risks and benefits  3. Recommend supportive treatment as  above 4. Follow-up prn if symptoms worsen or don't improve Controlled Substance Prescriptions [place] Controlled Substance Registry consulted? Not Applicable   [name], MD 05/29/18 [phone]

## 2018-05-29 NOTE — Discharge Instructions (Signed)
Warm compresses to area Elevate extremity

## 2018-07-09 ENCOUNTER — Other Ambulatory Visit: Payer: Self-pay

## 2018-07-09 ENCOUNTER — Ambulatory Visit
Admission: EM | Admit: 2018-07-09 | Discharge: 2018-07-09 | Disposition: A | Discharge status: Home / Self Care | Payer: Medicaid Other | Attending: Urgent Care | Admitting: Urgent Care

## 2018-07-09 ENCOUNTER — Encounter: Payer: Self-pay | Admitting: Emergency Medicine

## 2018-07-09 DIAGNOSIS — H1031 Unspecified acute conjunctivitis, right eye: Secondary | ICD-10-CM | POA: Diagnosis not present

## 2018-07-09 MED ORDER — POLYMYXIN B-TRIMETHOPRIM 10000-0.1 UNIT/ML-% OP SOLN: 1.0000 [drp] | Freq: Four times a day (QID) | OPHTHALMIC | 0 refills | Status: DC

## 2018-07-09 NOTE — ED Triage Notes (Signed)
Patient c/o redness and swelling in his right eye that started a week ago.  Patient denies fevers.  Patient denies cold symptoms.

## 2018-07-09 NOTE — Discharge Instructions (Signed)
It was very nice meeting you today in clinic. Thank you for entrusting me with your care.   As discussed, please utilize the medications that we discussed. Your prescriptions have been called in to your pharmacy. DO NOT WEAR YOUR CONTACT!   Call Dr. Jinny Sanders office to let him know that you had to come in. If your symptoms/condition worsens, please seek follow up care either here or in the ER. Please remember, our [facility] providers are "right here with you" when you need [place].   Again, it was my pleasure to take care of you today. Thank you for choosing our clinic. I hope that you start to feel better quickly.   Quentin Mulling, MSN, APRN, FNP-C, CEN Advanced Practice Provider [facility]

## 2018-07-09 NOTE — ED Provider Notes (Signed)
[address], [address] [place], [place] [address] [phone]   Name: Cory Campbell DOB: Sep 12, 1980 MRN: [id number] CSN: [id number] PCP: Emogene MorganAycock, Ngwe A, MD  Arrival date and time:  07/09/18 1517  Chief Complaint:  Eye Problem (right)  NOTE: Prior to seeing the patient today, I have reviewed the triage nursing documentation and vital signs. Clinical staff has updated patient's PMH/PSHx, current medication list, and drug allergies/intolerances to ensure comprehensive history available to assist in medical decision making.   History:   HPI: Cory Campbell is a 38 y.o. [place] who presents today with complaints of pain and swelling to his RIGHT x1 week.  Patient notes that his eye feels "scratchy".  He has experienced excessive tearing and some exudative crusting.  Patient denies visual changes associated photophobia. Patient presents to the clinic today with contact lens in place.  Complicating his presentation is the fact the patient is status post corneal transplant x4 years ago.  He is on daily prednisolone eyedrops, and is followed by ophthalmology (Dr. Earlene Plateravis[facility].  Visual Acuity: Right Eye Distance: 20/25 corrected Left Eye Distance: 20/200 uncorrected Bilateral Distance: 20/25   Past Medical History:  Diagnosis Date   Abrasion or friction burn of foot and toe(s), without mention of infection    Acid reflux    Adopted    Allergic rhinitis, cause unspecified    Anxiety    Arthritis    Arthrogryposis    Asthma    Bladder wall thickening    BPH (benign prostatic hyperplasia)    Chronic pain syndrome    Depressive disorder, not elsewhere classified    Diabetes mellitus without complication (HCC)    Diverticulitis of colon (without mention of hemorrhage)(562.11)    ED (erectile dysfunction)    Herpes genitalis    History of echocardiogram    a. 10/2016: EF 50-55%, normal wall motion   History of stress test    a. 06/2011: significant GI uptake  artifact, no evidence of ischemia, EF 56%   HIV infection (HCC)    HTN (hypertension)    Hyperlipidemia    Hypogonadism in [place]    Myalgia and myositis, unspecified    OAB (overactive bladder)    Obesity    Other psoriasis    Premature baby    born premature   Seizure disorder (HCC)    Sleep apnea    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 or other cause, without mention of thyrotoxic crisis or storm    hyperthyroidism   TIA (transient ischemic attack)    Type II or unspecified type diabetes mellitus with neurological manifestations, not stated as uncontrolled(250.60)     Past Surgical History:  Procedure Laterality Date   APPENDECTOMY     COLOSTOMY     CORNEAL TRANSPLANT     feet surgery     both   HAND SURGERY     left and right   leg surgery     left and right   ORTHOPEDIC SURGERY     hands, feet, knees, legs   tubes in ears     both    Family History  Adopted: Yes  Family history unknown: Yes    Social History   Socioeconomic History   Marital status: Married    Spouse name: Not on file   Number of children: Not on file   Years of education: Not on file   Highest education level: Not on file  Occupational History   Not on file  Social Needs  Financial resource strain: Not on file   Food insecurity:    Worry: Not on file    Inability: Not on file   Transportation needs:    Medical: Not on file    Non-medical: Not on file  Tobacco Use   Smoking status: Former Smoker    Years: 0.00    Types: Pipe    Last attempt to quit: 01/2018    Years since quitting: 0.5   Smokeless tobacco: Never Used  Substance and Sexual Activity   Alcohol use: Not Currently    Alcohol/week: 0.0 standard drinks    Comment: used to be moderate   Drug use: No   Sexual activity: Not on file  Lifestyle   Physical activity:    Days per week: Not on file    Minutes per session: Not on file   Stress: Not on file  Relationships    Social connections:    Talks on phone: Not on file    Gets together: Not on file    Attends religious service: Not on file    Active member of club or organization: Not on file    Attends meetings of clubs or organizations: Not on file    Relationship status: Not on file   Intimate partner violence:    Fear of current or ex partner: Not on file    Emotionally abused: Not on file    Physically abused: Not on file    Forced sexual activity: Not on file  Other Topics Concern   Not on file  Social History Narrative   Uses crutches to ambulate    Patient Active Problem List   Diagnosis Date Noted   Surgical wound, non healing    Acute colitis 05/20/2017   Colitis    Acute renal failure (HCC) 05/15/2017   Diarrhea 05/15/2017   Sepsis (HCC) 05/15/2017   Abdominal pain 11/25/2016   Hypotension 10/08/2016   Unstable angina (HCC) 10/06/2016   Shortness of breath 06/23/2015   Obesity 05/18/2014   Frequent falls 08/12/2013   Pain of left clavicle 08/12/2013   Tachycardia 01/21/2013   Obstructive sleep apnea on CPAP 06/07/2012   Chest pain 06/20/2011   Uncontrolled hypertension 06/20/2011   Type 2 diabetes mellitus with complication, without long-term current use of insulin (HCC) 06/20/2011    Home Medications:    Current Meds  Medication Sig   albuterol (PROVENTIL HFA;VENTOLIN HFA) 108 (90 BASE) MCG/ACT inhaler Inhale 2 puffs into the lungs every 4 (four) hours as needed for wheezing or shortness of breath.    buPROPion (WELLBUTRIN XL) 150 MG 24 hr tablet Take 1 tablet by mouth daily.   citalopram (CELEXA) 20 MG tablet Take 20 mg by mouth daily.   famotidine (PEPCID) 20 MG tablet Take 20 mg by mouth daily.    feeding supplement, GLUCERNA SHAKE, (GLUCERNA SHAKE) LIQD Take 237 mLs by mouth 4 (four) times daily.   ferrous sulfate 325 (65 FE) MG tablet Take by mouth.   finasteride (PROSCAR) 5 MG tablet Take 5 mg by mouth daily.   fluticasone (FLOVENT  HFA) 110 MCG/ACT inhaler Inhale 2 puffs into the lungs 2 (two) times daily.   gabapentin (NEURONTIN) 300 MG capsule Take 300 mg by mouth at bedtime.   insulin degludec (TRESIBA FLEXTOUCH) 100 UNIT/ML SOPN FlexTouch Pen Inject 80 Units into the skin at bedtime.    lisinopril (PRINIVIL,ZESTRIL) 40 MG tablet Take 1 tablet (40 mg total) by mouth daily.   loratadine (CLARITIN) 10 MG tablet  Take 10 mg by mouth daily.   magnesium oxide (MAG-OX) 400 (241.3 Mg) MG tablet Take 1 tablet (400 mg total) by mouth daily.   Melatonin 5 MG TABS Take 10 mg by mouth at bedtime as needed (sleep).   metoprolol tartrate (LOPRESSOR) 100 MG tablet Take 1 tablet (100 mg total) by mouth 2 (two) times daily.   mirtazapine (REMERON) 45 MG tablet Take 45 mg by mouth at bedtime.   Multiple Vitamin (MULTIVITAMIN) tablet Take 1 tablet by mouth daily.   naproxen (NAPROSYN) 500 MG tablet Take 1 tablet (500 mg total) by mouth 2 (two) times daily.   nortriptyline (PAMELOR) 25 MG capsule Take 50 mg by mouth at bedtime.   nystatin-triamcinolone (MYCOLOG II) cream Apply 1 application topically 2 (two) times daily.   omega-3 acid ethyl esters (LOVAZA) 1 g capsule Take 1 g by mouth daily.   omeprazole (PRILOSEC) 20 MG capsule Take 20 mg by mouth daily.   ondansetron (ZOFRAN-ODT) 4 MG disintegrating tablet Take 4 mg by mouth every 8 (eight) hours as needed for nausea or vomiting.   prednisoLONE acetate (PRED FORTE) 1 % ophthalmic suspension Place 1 drop into the right eye daily.    rosuvastatin (CRESTOR) 40 MG tablet Take 1 tablet (40 mg total) by mouth daily.   solifenacin (VESICARE) 10 MG tablet Take 10 mg by mouth daily.   tiZANidine (ZANAFLEX) 4 MG capsule Take 4 mg by mouth 3 (three) times daily as needed for muscle spasms.    Allergies:   Penicillins and Erythromycin base  Review of Systems (ROS): Review of Systems  Constitutional: Negative for chills and fever.  HENT: Negative for congestion, rhinorrhea,  sinus pressure and sinus pain.   Eyes: Positive for pain, discharge, redness and itching. Negative for photophobia and visual disturbance.  Respiratory: Negative for cough and shortness of breath.   Cardiovascular: Negative for chest pain and palpitations.     Physical Exam:  Triage Vital Signs ED Triage Vitals  Enc Vitals Group     BP 07/09/18 1531 (!) 166/100     Pulse Rate 07/09/18 1531 82     Resp 07/09/18 1531 16     Temp 07/09/18 1531 97.7 F (36.5 C)     Temp Source 07/09/18 1531 Oral     SpO2 07/09/18 1531 98 %     Weight 07/09/18 1528 185 lb (83.9 kg)     Height 07/09/18 1528  (1.651 m)     Head Circumference --      Peak Flow --      Pain Score 07/09/18 1527 8     Pain Loc --      Pain Edu? --      Excl. in GC? --     Physical Exam  Constitutional: He is oriented to person, place, and time and well-developed, well-nourished, and in no distress.  HENT:  Head: Normocephalic and atraumatic.  Mouth/Throat: Mucous membranes are normal.  Eyes: Pupils are equal, round, and reactive to light. EOM are normal. Lids are everted and swept, no foreign bodies found. Right eye exhibits discharge (clear) and exudate (slight yellow crusting to lower lid). No foreign body present in the right eye. Left eye exhibits no discharge and no exudate. No foreign body present in the left eye. Right conjunctiva is injected. Right conjunctiva has no hemorrhage. Left conjunctiva is not injected. Left conjunctiva has no hemorrhage.  Contact lens in place to RIGHT eye.  External eye tender with no associated edema.  Neck: Normal range of motion. Neck supple. No tracheal deviation present.  Cardiovascular: Normal rate, regular rhythm, normal heart sounds and intact distal pulses. Exam reveals no gallop and no friction rub.  No murmur heard. Pulmonary/Chest: Effort normal and breath sounds normal. No respiratory distress. He has no wheezes. He has no rales.  Neurological: He is alert and oriented  to person, place, and time.  Skin: Skin is warm and dry. No rash noted. No erythema.  Psychiatric: Mood, affect and judgment normal.  Nursing note and vitals reviewed.    Urgent Care Treatments / Results:   LABS: PLEASE NOTE: all labs that were ordered this encounter are listed, however only abnormal results are displayed. Labs Reviewed - No data to display  EKG: -None  RADIOLOGY: No results found.  PRODEDURES: Procedures  MEDICATIONS RECEIVED THIS VISIT: Medications - No data to display  PERTINENT CLINICAL COURSE NOTES/UPDATES:   Initial Impression / Assessment and Plan / Urgent Care Course:    RAKIEM FACTOR is a 38 y.o. [place] who presents to [facility] today with complaints of Eye Problem (right)  Pertinent labs & imaging results that were available during my care of the patient were personally reviewed by me and considered in my medical decision making (see lab/imaging section of note for values and interpretations).  Exam reveals excessive tearing and exudative crusting to RIGHT eye.  Patient notes that symptoms have been progressive x1 week.  Eye feels "scratchy".  No visual changes or photophobia.  Patient presents with contact lens in place.  Given his history of corneal transplant, it is concerning that patient has acute eye infection.  He is on daily prednisolone eyedrops.  He was encouraged to continue as prescribed by Dr. Earlene Plater [facility] ophthalmology).  Patient advised to remove contact and leave contact out until infection cleared, which means that he will be unable to wear his contact lens for at least the next week.  Will prescribe Polytrim ophthalmic drops.  Patient was advised to contact Dr. Earlene Plater to make him aware of presenting symptoms.  He was encouraged to avoid rubbing his eye to prevent transmission.  Educated on frequent handwashing.  Discussed follow up with primary care physician in 1 week for re-evaluation. I have reviewed the follow up and strict  return precautions for any new or worsening symptoms. Patient is aware of symptoms that would be deemed urgent/emergent, and would thus require further evaluation either here or in the emergency department. At the time of discharge, he verbalized understanding and consent with the discharge plan as it was reviewed with him. All questions were fielded by provider and/or clinic staff prior to patient discharge.    Final Clinical Impressions(s) / Urgent Care Diagnoses:   Final diagnoses:  Acute conjunctivitis of right eye, unspecified acute conjunctivitis type    New Prescriptions:   Meds ordered this encounter  Medications   trimethoprim-polymyxin b (POLYTRIM) ophthalmic solution    Sig: Place 1 drop into the right eye every 6 (six) hours. X 5 days    Dispense:  10 mL    Refill:  0    Controlled Substance Prescriptions:  [place] Controlled Substance Registry consulted? Not Applicable  NOTE: This note was prepared using Dragon dictation software along with smaller phrase technology. Despite my best ability to proofread, there is the potential that transcriptional errors may still occur from this process, and are completely unintentional.     Verlee Monte, NP 07/09/18 2003

## 2018-07-15 ENCOUNTER — Encounter: Payer: Self-pay | Admitting: Emergency Medicine

## 2018-07-15 ENCOUNTER — Ambulatory Visit
Admission: EM | Admit: 2018-07-15 | Discharge: 2018-07-15 | Disposition: A | Discharge status: Home / Self Care | Payer: Medicaid Other | Attending: Family Medicine | Admitting: Family Medicine

## 2018-07-15 ENCOUNTER — Other Ambulatory Visit: Payer: Self-pay

## 2018-07-15 DIAGNOSIS — S20461A Insect bite (nonvenomous) of right back wall of thorax, initial encounter: Secondary | ICD-10-CM | POA: Diagnosis not present

## 2018-07-15 DIAGNOSIS — W57XXXA Bitten or stung by nonvenomous insect and other nonvenomous arthropods, initial encounter: Secondary | ICD-10-CM

## 2018-07-15 MED ORDER — DOXYCYCLINE HYCLATE 100 MG PO TABS: 100.0000 mg | ORAL_TABLET | Freq: Two times a day (BID) | ORAL | 0 refills | Status: DC

## 2018-07-15 NOTE — ED Triage Notes (Signed)
Patient states that he thinks he might have an insect bite on his back since yesterday.  Patient reports some itching and tenderness.

## 2018-07-15 NOTE — ED Provider Notes (Signed)
[facility]    CSN: [id number] Arrival date & time: 07/15/18  1003     History   Chief Complaint Chief Complaint  Patient presents with  . Insect Bite    HPI Cory Campbell is a 38 y.o. male.   38 yo male with a c/o insect bite to his back that he noticed yesterday. C/o pain and redness to the area with slight drainage. Denies fevers or chills.      Past Medical History:  Diagnosis Date  . Abrasion or friction burn of foot and toe(s), without mention of infection   . Acid reflux   . Adopted   . Allergic rhinitis, cause unspecified   . Anxiety   . Arthritis   . Arthrogryposis   . Asthma   . Bladder wall thickening   . BPH (benign prostatic hyperplasia)   . Chronic pain syndrome   . Depressive disorder, not elsewhere classified   . Diabetes mellitus without complication (HCC)   . Diverticulitis of colon (without mention of hemorrhage)(562.11)   . ED (erectile dysfunction)   . Herpes genitalis   . History of echocardiogram    a. 10/2016: EF 50-55%, normal wall motion  . History of stress test    a. 06/2011: significant GI uptake artifact, no evidence of ischemia, EF 56%  . HIV infection (HCC)   . HTN (hypertension)   . Hyperlipidemia   . Hypogonadism in male   . Myalgia and myositis, unspecified   . OAB (overactive bladder)   . Obesity   . Other psoriasis   . Premature baby    born premature  . Seizure disorder (HCC)   . Sleep apnea   . 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 or other cause, without mention of thyrotoxic crisis or storm    hyperthyroidism  . TIA (transient ischemic attack)   . Type II or unspecified type diabetes mellitus with neurological manifestations, not stated as uncontrolled(250.60)     Patient Active Problem List   Diagnosis Date Noted  . Surgical wound, non healing   . Acute colitis 05/20/2017  . Colitis   . Acute renal failure (HCC) 05/15/2017  . Diarrhea 05/15/2017  . Sepsis (HCC) 05/15/2017  . Abdominal pain  11/25/2016  . Hypotension 10/08/2016  . Unstable angina (HCC) 10/06/2016  . Shortness of breath 06/23/2015  . Obesity 05/18/2014  . Frequent falls 08/12/2013  . Pain of left clavicle 08/12/2013  . Tachycardia 01/21/2013  . Obstructive sleep apnea on CPAP 06/07/2012  . Chest pain 06/20/2011  . Uncontrolled hypertension 06/20/2011  . Type 2 diabetes mellitus with complication, without long-term current use of insulin (HCC) 06/20/2011    Past Surgical History:  Procedure Laterality Date  . APPENDECTOMY    . COLOSTOMY    . CORNEAL TRANSPLANT    . feet surgery     both  . HAND SURGERY     left and right  . leg surgery     left and right  . ORTHOPEDIC SURGERY     hands, feet, knees, legs  . tubes in ears     both       Home Medications    Prior to Admission medications   Medication Sig Start Date End Date Taking? Authorizing Provider  albuterol (PROVENTIL HFA;VENTOLIN HFA) 108 (90 BASE) MCG/ACT inhaler Inhale 2 puffs into the lungs every 4 (four) hours as needed for wheezing or shortness of breath.    Yes [provider]  albuterol (PROVENTIL) (2.5 MG/3ML)  0.083% nebulizer solution Take 2.5 mg by nebulization every 4 (four) hours as needed for wheezing or shortness of breath.   Yes [provider]  alfuzosin (UROXATRAL) 10 MG 24 hr tablet Take 10 mg by mouth daily.   Yes [provider]  Buprenorphine (BUTRANS) 15 MCG/HR PTWK Place 15 mg onto the skin once a week. Saturday   Yes [provider]  buPROPion (WELLBUTRIN XL) 150 MG 24 hr tablet Take 1 tablet by mouth daily. 11/16/16  Yes [provider]  diclofenac sodium (VOLTAREN) 1 % GEL Apply 2-4 g topically 4 (four) times daily as needed. For pain.   Yes [provider]  dicyclomine (BENTYL) 20 MG tablet Take 20 mg by mouth every 6 (six) hours.   Yes [provider]  famotidine (PEPCID) 20 MG tablet Take 20 mg by mouth daily.  01/03/16  Yes [provider]   feeding supplement, GLUCERNA SHAKE, (GLUCERNA SHAKE) LIQD Take 237 mLs by mouth 4 (four) times daily.   Yes [provider]  ferrous sulfate 325 (65 FE) MG tablet Take by mouth.   Yes [provider]  finasteride (PROSCAR) 5 MG tablet Take 5 mg by mouth daily.   Yes [provider]  fluticasone (FLOVENT HFA) 110 MCG/ACT inhaler Inhale 2 puffs into the lungs 2 (two) times daily.   Yes [provider]  gabapentin (NEURONTIN) 300 MG capsule Take 300 mg by mouth at bedtime.   Yes [provider]  ibuprofen (ADVIL,MOTRIN) 600 MG tablet Take 1 tablet (600 mg total) by mouth every 6 (six) hours as needed. 05/05/18  Yes Melynda Ripple, MD  insulin degludec (TRESIBA FLEXTOUCH) 100 UNIT/ML SOPN FlexTouch Pen Inject 80 Units into the skin at bedtime.    Yes [provider]  lisinopril (PRINIVIL,ZESTRIL) 40 MG tablet Take 1 tablet (40 mg total) by mouth daily. 04/23/18 07/22/18 Yes Gollan, Kathlene November, MD  loratadine (CLARITIN) 10 MG tablet Take 10 mg by mouth daily.   Yes [provider]  magnesium oxide (MAG-OX) 400 (241.3 Mg) MG tablet Take 1 tablet (400 mg total) by mouth daily. 05/23/17  Yes Salary, Avel Peace, MD  Melatonin 5 MG TABS Take 10 mg by mouth at bedtime as needed (sleep).   Yes [provider]  metFORMIN (GLUCOPHAGE-XR) 750 MG 24 hr tablet Take 750 mg by mouth 2 (two) times daily.   Yes [provider]  metoprolol tartrate (LOPRESSOR) 100 MG tablet Take 1 tablet (100 mg total) by mouth 2 (two) times daily. 04/23/18  Yes Gollan, Kathlene November, MD  miconazole (MICOTIN) 2 % cream Apply 1 application topically 2 (two) times daily.   Yes [provider]  mirtazapine (REMERON) 45 MG tablet Take 45 mg by mouth at bedtime.   Yes [provider]  Multiple Vitamin (MULTIVITAMIN) tablet Take 1 tablet by mouth daily.   Yes [provider]  NOVOLOG FLEXPEN 100 UNIT/ML FlexPen Inject 12 Units into the skin 3  (three) times daily with meals. With every meal and every snack 12/13/15  Yes [provider]  omega-3 acid ethyl esters (LOVAZA) 1 g capsule Take 1 g by mouth daily.   Yes [provider]  rosuvastatin (CRESTOR) 40 MG tablet Take 1 tablet (40 mg total) by mouth daily. 05/17/18 08/15/18 Yes Minna Merritts, MD  Spacer/Aero-Holding Chambers (AEROCHAMBER PLUS) inhaler Use as instructed 03/23/18  Yes Melynda Ripple, MD  tiZANidine (ZANAFLEX) 4 MG capsule Take 4 mg by mouth 3 (three) times  daily as needed for muscle spasms.   Yes [provider]  acyclovir (ZOVIRAX) 400 MG tablet Take 400 mg by mouth 2 (two) times daily.    [provider]  BIKTARVY 50-200-25 MG TABS tablet Take 1 tablet by mouth daily. 11/21/16   [provider]  citalopram (CELEXA) 20 MG tablet Take 20 mg by mouth daily.    [provider]  doxycycline (VIBRA-TABS) 100 MG tablet Take 1 tablet (100 mg total) by mouth 2 (two) times daily. 07/15/18   Payton Mccallumonty, Catori Panozzo, MD  hydrocortisone 2.5 % cream Apply 1 application topically 2 (two) times daily as needed for itching.     [provider]  ketoconazole (NIZORAL) 2 % shampoo Apply 1 application topically 2 (two) times a week. tues and thursday 08/27/14   [provider]  naproxen (NAPROSYN) 500 MG tablet Take 1 tablet (500 mg total) by mouth 2 (two) times daily. 04/18/18   Candis SchatzHarris, Michael D, PA-C  nitroGLYCERIN (NITROSTAT) 0.4 MG SL tablet DISSOLVE 1 TABLET UNDER THE TONGUE EVERY 5 MINUTES AS NEEDED FOR CHEST PAIN. IF AFTER 3 DOSES, NO RELIEF, CALL 911 05/27/18   Antonieta IbaGollan, Timothy J, MD  nortriptyline (PAMELOR) 25 MG capsule Take 50 mg by mouth at bedtime.    [provider]  nystatin-triamcinolone (MYCOLOG II) cream Apply 1 application topically 2 (two) times daily.    [provider]  omeprazole (PRILOSEC) 20 MG capsule Take 20 mg by mouth daily.    [provider]  ondansetron (ZOFRAN-ODT) 4 MG  disintegrating tablet Take 4 mg by mouth every 8 (eight) hours as needed for nausea or vomiting.    [provider]  phenazopyridine (PYRIDIUM) 200 MG tablet Take 1 tablet (200 mg total) by mouth 3 (three) times daily as needed for pain. 05/05/18   Domenick GongMortenson, Ashley, MD  potassium chloride SA (K-DUR,KLOR-CON) 20 MEQ tablet take 1 tablet by mouth once daily if needed Patient taking differently: take 1 tablet by mouth once daily 02/08/16   Antonieta IbaGollan, Timothy J, MD  prednisoLONE acetate (PRED FORTE) 1 % ophthalmic suspension Place 1 drop into the right eye daily.     [provider]  solifenacin (VESICARE) 10 MG tablet Take 10 mg by mouth daily.    [provider]  trimethoprim-polymyxin b (POLYTRIM) ophthalmic solution Place 1 drop into the right eye every 6 (six) hours. X 5 days 07/09/18   Verlee MonteGray, Bryan E, NP    Family History Family History  Adopted: Yes  Family history unknown: Yes    Social History Social History   Tobacco Use  . Smoking status: Former Smoker    Years: 0.00    Types: Pipe    Last attempt to quit: 01/2018    Years since quitting: 0.5  . Smokeless tobacco: Never Used  Substance Use Topics  . Alcohol use: Not Currently    Alcohol/week: 0.0 standard drinks    Comment: used to be moderate  . Drug use: No     Allergies   Penicillins and Erythromycin base   Review of Systems Review of Systems   Physical Exam Triage Vital Signs ED Triage Vitals  Enc Vitals Group     BP 07/15/18 1014 (!) 148/100     Pulse Rate 07/15/18 1014 97     Resp 07/15/18 1014 16     Temp 07/15/18 1014 98.6 F (37 C)     Temp Source 07/15/18 1014 Oral     SpO2 07/15/18 1014 98 %  Weight 07/15/18 1011 175 lb (79.4 kg)     Height 07/15/18 1011 5\' 5"  (1.651 m)     Head Circumference --      Peak Flow --      Pain Score 07/15/18 1011 9     Pain Loc --      Pain Edu? --      Excl. in GC? --    No data found.  Updated Vital Signs BP (!) 148/100 (BP  Location: Left Arm)   Pulse 97   Temp 98.6 F (37 C) (Oral)   Resp 16   Ht 5\' 5"  (1.651 m)   Wt 79.4 kg   SpO2 98%   BMI 29.12 kg/m   Visual Acuity Right Eye Distance:   Left Eye Distance:   Bilateral Distance:    Right Eye Near:   Left Eye Near:    Bilateral Near:     Physical Exam Vitals signs and nursing note reviewed.  Constitutional:      General: He is not in acute distress.    Appearance: He is not toxic-appearing or diaphoretic.  Skin:    Comments: 1.5cm erythematous, tender area with slight drainage  Neurological:     Mental Status: He is alert.      UC Treatments / Results  Labs (all labs ordered are listed, but only abnormal results are displayed) Labs Reviewed - No data to display  EKG None  Radiology No results found.  Procedures Procedures (including critical care time)  Medications Ordered in UC Medications - No data to display  Initial Impression / Assessment and Plan / UC Course  I have reviewed the triage vital signs and the nursing notes.  Pertinent labs & imaging results that were available during my care of the patient were reviewed by me and considered in my medical decision making (see chart for details).      Final Clinical Impressions(s) / UC Diagnoses   Final diagnoses:  Insect bite of right back wall of thorax, initial encounter    ED Prescriptions    Medication Sig Dispense Auth. Provider   doxycycline (VIBRA-TABS) 100 MG tablet Take 1 tablet (100 mg total) by mouth 2 (two) times daily. 14 tablet Payton Mccallumonty, Chloeanne Poteet, MD     1. Labs/x-ray results and diagnosis reviewed with patient 2. rx as per orders above; reviewed possible side effects, interactions, risks and benefits  3. Recommend supportive treatment with warm compresses to area 4. Follow-up prn if symptoms worsen or don't improve   Controlled Substance Prescriptions [place] Controlled Substance Registry consulted? Not Applicable   Payton Mccallumonty, Evren Shankland, MD 07/15/18 1051

## 2018-07-22 ENCOUNTER — Other Ambulatory Visit: Payer: Self-pay | Admitting: Cardiovascular Disease

## 2018-08-20 ENCOUNTER — Other Ambulatory Visit: Payer: Self-pay | Admitting: Cardiovascular Disease

## 2018-08-20 NOTE — Telephone Encounter (Signed)
°*  STAT* If patient is at the pharmacy, call can be transferred to refill team.   1. Which medications need to be refilled? (please list name of each medication and dose if known) Nitroglycerin .4mg    2. Which pharmacy/location (including street and city if local pharmacy) is medication to be sent to? [facility] in [place] on [address]  3. Do they need a 30 day or 90 day supply? [place]

## 2018-08-20 NOTE — Telephone Encounter (Signed)
I spoke with pt today and he mentioned that he still has nitroglycerin tablets. He isn't using them only when he has active chest pain for emergency's Pt will contact office if he needs his nitro refilled. Pt is aware last refill 07/23/2018 which he should still have nitro if he hasn't been using them. Pt denied any chest pain or cardiac symptoms at the time. He will contact office if he has any concerns.

## 2018-09-15 ENCOUNTER — Encounter: Payer: Self-pay | Admitting: Emergency Medicine

## 2018-09-15 ENCOUNTER — Emergency Department
Admission: EM | Admit: 2018-09-15 | Discharge: 2018-09-15 | Disposition: A | Discharge status: Home / Self Care | Payer: Medicaid Other | Attending: Emergency Medicine | Admitting: Emergency Medicine

## 2018-09-15 ENCOUNTER — Other Ambulatory Visit: Payer: Self-pay

## 2018-09-15 DIAGNOSIS — Z79899 Other long term (current) drug therapy: Secondary | ICD-10-CM | POA: Insufficient documentation

## 2018-09-15 DIAGNOSIS — I10 Essential (primary) hypertension: Secondary | ICD-10-CM | POA: Insufficient documentation

## 2018-09-15 DIAGNOSIS — E119 Type 2 diabetes mellitus without complications: Secondary | ICD-10-CM | POA: Insufficient documentation

## 2018-09-15 DIAGNOSIS — B20 Human immunodeficiency virus [HIV] disease: Secondary | ICD-10-CM | POA: Diagnosis not present

## 2018-09-15 DIAGNOSIS — E1149 Type 2 diabetes mellitus with other diabetic neurological complication: Secondary | ICD-10-CM | POA: Insufficient documentation

## 2018-09-15 DIAGNOSIS — Z87891 Personal history of nicotine dependence: Secondary | ICD-10-CM | POA: Insufficient documentation

## 2018-09-15 DIAGNOSIS — Z794 Long term (current) use of insulin: Secondary | ICD-10-CM | POA: Insufficient documentation

## 2018-09-15 LAB — BASIC METABOLIC PANEL
Anion gap: 11 (ref 5–15)
BUN: 23 mg/dL — ABNORMAL HIGH (ref 6–20)
CO2: 25 mmol/L (ref 22–32)
Calcium: 9.3 mg/dL (ref 8.9–10.3)
Chloride: 102 mmol/L (ref 98–111)
Creatinine, Ser: 1.07 mg/dL (ref 0.61–1.24)
GFR calc Af Amer: >60 mL/min (ref >60)
GFR calc non Af Amer: >60 mL/min (ref >60)
Glucose, Bld: 224 mg/dL — ABNORMAL HIGH (ref 70–99)
Potassium: 4.3 mmol/L (ref 3.5–5.1)
Sodium: 138 mmol/L (ref 135–145)

## 2018-09-15 LAB — CBC
HCT: 43.2 % (ref 39.0–52.0)
Hemoglobin: 13.9 g/dL (ref 13.0–17.0)
MCH: 27.1 pg (ref 26.0–34.0)
MCHC: 32.2 g/dL (ref 30.0–36.0)
MCV: 84.4 fL (ref 80.0–100.0)
Platelets: 230 10*3/uL (ref 150–400)
RBC: 5.12 MIL/uL (ref 4.22–5.81)
RDW: 15.8 % — ABNORMAL HIGH (ref 11.5–15.5)
WBC: 6.6 10*3/uL (ref 4.0–10.5)
nRBC: 0 % (ref 0.0–0.2)

## 2018-09-15 LAB — URINALYSIS, COMPLETE (UACMP) WITH MICROSCOPIC
Bacteria, UA: NONE SEEN
Bilirubin Urine: NEGATIVE
Glucose, UA: 150 mg/dL — AB
Hgb urine dipstick: NEGATIVE
Ketones, ur: NEGATIVE mg/dL
Leukocytes,Ua: NEGATIVE
Nitrite: NEGATIVE
Protein, ur: 100 mg/dL — AB
Specific Gravity, Urine: 1.017 (ref 1.005–1.030)
pH: 7 (ref 5.0–8.0)

## 2018-09-15 MED ORDER — SODIUM CHLORIDE 0.9 % IV BOLUS
1000.0000 mL | Freq: Once | INTRAVENOUS | Status: AC
  Administered 2018-09-15: 1000 mL via INTRAVENOUS

## 2018-09-15 NOTE — ED Triage Notes (Signed)
Pt c/o htn and weakness. Dr increased his bp meds 2 weeks. No cp, no dizziness

## 2018-09-15 NOTE — ED Provider Notes (Signed)
[facility] Emergency Department Provider Note  Time seen: 7:40 PM  I have reviewed the triage vital signs and the nursing notes.   HISTORY  Chief Complaint Hypertension   HPI Cory Campbell is a 38 y.o. male with a past medical history of diabetes, hypertension, gastric reflux, presents to the emergency department for evaluation of high blood pressure.  According to the patient he was taking his blood pressure today and it read 170/120 was concerned the patient so he came to the emergency department.  Patient states he has been having trouble with his blood pressure, his primary care doctor increased his blood pressure medications 2 weeks ago.  He has been taking his blood pressure multiple times a day and recording it.  Has a follow-up appointment this Thursday.  Patient denies any chest pain or shortness of breath.  Denies any fever cough congestion.  Overall the patient appears very well, no acute distress.  He does state he has been feeling somewhat more weak today than normal.  Past Medical History:  Diagnosis Date  . Abrasion or friction burn of foot and toe(s), without mention of infection   . Acid reflux   . Adopted   . Allergic rhinitis, cause unspecified   . Anxiety   . Arthritis   . Arthrogryposis   . Asthma   . Bladder wall thickening   . BPH (benign prostatic hyperplasia)   . Chronic pain syndrome   . Depressive disorder, not elsewhere classified   . Diabetes mellitus without complication ([place])   . Diverticulitis of colon (without mention of hemorrhage)(562.11)   . ED (erectile dysfunction)   . Herpes genitalis   . History of echocardiogram    a. 10/2016: EF 50-55%, normal wall motion  . History of stress test    a. 06/2011: significant GI uptake artifact, no evidence of ischemia, EF 56%  . HIV infection ([place])   . HTN (hypertension)   . Hyperlipidemia   . Hypogonadism in male   . Myalgia and myositis, unspecified   . OAB (overactive  bladder)   . Obesity   . Other psoriasis   . Premature baby    born premature  . Seizure disorder ([place])   . Sleep apnea   . 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 or other cause, without mention of thyrotoxic crisis or storm    hyperthyroidism  . TIA (transient ischemic attack)   . Type II or unspecified type diabetes mellitus with neurological manifestations, not stated as uncontrolled(250.60)     Patient Active Problem List   Diagnosis Date Noted  . Surgical wound, non healing   . Acute colitis 05/20/2017  . Colitis   . Acute renal failure ([place]) 05/15/2017  . Diarrhea 05/15/2017  . Sepsis ([place]) 05/15/2017  . Abdominal pain 11/25/2016  . Hypotension 10/08/2016  . Unstable angina ([place]) 10/06/2016  . Shortness of breath 06/23/2015  . Obesity 05/18/2014  . Frequent falls 08/12/2013  . Pain of left clavicle 08/12/2013  . Tachycardia 01/21/2013  . Obstructive sleep apnea on CPAP 06/07/2012  . Chest pain 06/20/2011  . Uncontrolled hypertension 06/20/2011  . Type 2 diabetes mellitus with complication, without long-term current use of insulin ([employer]) 06/20/2011    Past Surgical History:  Procedure Laterality Date  . APPENDECTOMY    . COLOSTOMY    . CORNEAL TRANSPLANT    . feet surgery     both  . HAND SURGERY     left and right  . leg  surgery     left and right  . ORTHOPEDIC SURGERY     hands, feet, knees, legs  . tubes in ears     both    Prior to Admission medications   Medication Sig Start Date End Date Taking? Authorizing Provider  acyclovir (ZOVIRAX) 400 MG tablet Take 400 mg by mouth 2 (two) times daily.    [provider]  albuterol (PROVENTIL HFA;VENTOLIN HFA) 108 (90 BASE) MCG/ACT inhaler Inhale 2 puffs into the lungs every 4 (four) hours as needed for wheezing or shortness of breath.     [provider]  albuterol (PROVENTIL) (2.5 MG/3ML) 0.083% nebulizer solution Take 2.5 mg by nebulization every 4 (four) hours as needed for wheezing or  shortness of breath.    [provider]  alfuzosin (UROXATRAL) 10 MG 24 hr tablet Take 10 mg by mouth daily.    [provider]  BIKTARVY 50-200-25 MG TABS tablet Take 1 tablet by mouth daily. 11/21/16   [provider]  Buprenorphine (BUTRANS) 15 MCG/HR PTWK Place 15 mg onto the skin once a week. Saturday    [provider]  buPROPion (WELLBUTRIN XL) 150 MG 24 hr tablet Take 1 tablet by mouth daily. 11/16/16   [provider]  citalopram (CELEXA) 20 MG tablet Take 20 mg by mouth daily.    [provider]  diclofenac sodium (VOLTAREN) 1 % GEL Apply 2-4 g topically 4 (four) times daily as needed. For pain.    [provider]  dicyclomine (BENTYL) 20 MG tablet Take 20 mg by mouth every 6 (six) hours.    [provider]  doxycycline (VIBRA-TABS) 100 MG tablet Take 1 tablet (100 mg total) by mouth 2 (two) times daily. 07/15/18   Payton Mccallumonty, [place], MD  famotidine (PEPCID) 20 MG tablet Take 20 mg by mouth daily.  01/03/16   [provider]  feeding supplement, GLUCERNA SHAKE, (GLUCERNA SHAKE) LIQD Take 237 mLs by mouth 4 (four) times daily.    [provider]  ferrous sulfate 325 (65 FE) MG tablet Take by mouth.    [provider]  finasteride (PROSCAR) 5 MG tablet Take 5 mg by mouth daily.    [provider]  fluticasone (FLOVENT HFA) 110 MCG/ACT inhaler Inhale 2 puffs into the lungs 2 (two) times daily.    [provider]  gabapentin (NEURONTIN) 300 MG capsule Take 300 mg by mouth at bedtime.    [provider]  hydrocortisone 2.5 % cream Apply 1 application topically 2 (two) times daily as needed for itching.     [provider]  ibuprofen (ADVIL,MOTRIN) 600 MG tablet Take 1 tablet (600 mg total) by mouth every 6 (six) hours as needed. 05/05/18   Domenick GongMortenson, Ashley, MD  insulin degludec (TRESIBA FLEXTOUCH) 100 UNIT/ML SOPN FlexTouch Pen Inject 80 Units into the skin at  bedtime.     [provider]  ketoconazole (NIZORAL) 2 % shampoo Apply 1 application topically 2 (two) times a week. tues and thursday 08/27/14   [provider]  lisinopril (PRINIVIL,ZESTRIL) 40 MG tablet Take 1 tablet (40 mg total) by mouth daily. 04/23/18 07/22/18  Antonieta IbaGollan, Timothy J, MD  loratadine (CLARITIN) 10 MG tablet Take 10 mg by mouth daily.    [provider]  magnesium oxide (MAG-OX) 400 (241.3 Mg) MG tablet Take 1 tablet (400 mg total) by mouth daily. 05/23/17   Salary, Jetty DuhamelMontell D, MD  Melatonin 5 MG TABS Take 10 mg by mouth at  bedtime as needed (sleep).    [provider]  metFORMIN (GLUCOPHAGE-XR) 750 MG 24 hr tablet Take 750 mg by mouth 2 (two) times daily.    [provider]  metoprolol tartrate (LOPRESSOR) 100 MG tablet TAKE 1 TABLET(100 MG) BY MOUTH TWICE DAILY 07/23/18   Antonieta IbaGollan, Timothy J, MD  miconazole (MICOTIN) 2 % cream Apply 1 application topically 2 (two) times daily.    [provider]  mirtazapine (REMERON) 45 MG tablet Take 45 mg by mouth at bedtime.    [provider]  Multiple Vitamin (MULTIVITAMIN) tablet Take 1 tablet by mouth daily.    [provider]  naproxen (NAPROSYN) 500 MG tablet Take 1 tablet (500 mg total) by mouth 2 (two) times daily. 04/18/18   Candis SchatzHarris, Michael D, PA-C  nitroGLYCERIN (NITROSTAT) 0.4 MG SL tablet DISSOLVE 1 TABLET UNDER THE TONGUE EVERY 5 MINUTES AS DIRECTED AS NEEDED FOR CHEST PAIN. IF AFTER 3 DOSES, NO RELIEF, CALL 911 07/23/18   Antonieta IbaGollan, Timothy J, MD  nortriptyline (PAMELOR) 25 MG capsule Take 50 mg by mouth at bedtime.    [provider]  NOVOLOG FLEXPEN 100 UNIT/ML FlexPen Inject 12 Units into the skin 3 (three) times daily with meals. With every meal and every snack 12/13/15   [provider]  nystatin-triamcinolone (MYCOLOG II) cream Apply 1 application topically 2 (two) times daily.    [provider]  omega-3 acid ethyl esters (LOVAZA) 1 g  capsule Take 1 g by mouth daily.    [provider]  omeprazole (PRILOSEC) 20 MG capsule Take 20 mg by mouth daily.    [provider]  ondansetron (ZOFRAN-ODT) 4 MG disintegrating tablet Take 4 mg by mouth every 8 (eight) hours as needed for nausea or vomiting.    [provider]  phenazopyridine (PYRIDIUM) 200 MG tablet Take 1 tablet (200 mg total) by mouth 3 (three) times daily as needed for pain. 05/05/18   Domenick GongMortenson, Ashley, MD  potassium chloride SA (K-DUR,KLOR-CON) 20 MEQ tablet take 1 tablet by mouth once daily if needed Patient taking differently: take 1 tablet by mouth once daily 02/08/16   Antonieta IbaGollan, Timothy J, MD  prednisoLONE acetate (PRED FORTE) 1 % ophthalmic suspension Place 1 drop into the right eye daily.     [provider]  rosuvastatin (CRESTOR) 40 MG tablet Take 1 tablet (40 mg total) by mouth daily. 05/17/18 08/15/18  Antonieta IbaGollan, Timothy J, MD  solifenacin (VESICARE) 10 MG tablet Take 10 mg by mouth daily.    [provider]  Spacer/Aero-Holding Chambers (AEROCHAMBER PLUS) inhaler Use as instructed 03/23/18   Domenick GongMortenson, Ashley, MD  tiZANidine (ZANAFLEX) 4 MG capsule Take 4 mg by mouth 3 (three) times daily as needed for muscle spasms.    [provider]  trimethoprim-polymyxin b (POLYTRIM) ophthalmic solution Place 1 drop into the right eye every 6 (six) hours. X 5 days 07/09/18   Verlee MonteGray, Bryan E, NP    Allergies  Allergen Reactions  . Penicillins Hives, Itching and Rash    Has patient had a PCN reaction causing immediate rash, facial/tongue/throat swelling, SOB or lightheadedness with hypotension: Yes Has patient had a PCN reaction causing severe rash involving mucus membranes or skin necrosis: No Has patient had a PCN reaction that required hospitalization No Has patient had a PCN reaction occurring within the last 10 years: No If all of the above answers are "NO", then may proceed with Cephalosporin use.   . Erythromycin Base  Itching and Rash  Family History  Adopted: Yes  Family history unknown: Yes    Social History Social History   Tobacco Use  . Smoking status: Former Smoker    Years: 0.00    Types: Pipe    Quit date: 01/2018    Years since quitting: 0.6  . Smokeless tobacco: Never Used  Substance Use Topics  . Alcohol use: Not Currently    Alcohol/week: 0.0 standard drinks    Comment: used to be moderate  . Drug use: No    Review of Systems Constitutional: Negative for fever Cardiovascular: Negative for chest pain. Respiratory: Negative for shortness of breath.  Negative for cough. Gastrointestinal: Negative for abdominal pain, vomiting  Musculoskeletal: Negative for musculoskeletal complaints Neurological: Negative for headache All other ROS negative  ____________________________________________   PHYSICAL EXAM:  VITAL SIGNS: ED Triage Vitals  Enc Vitals Group     BP 09/15/18 1900 (!) 142/99     Pulse Rate 09/15/18 1849 87     Resp 09/15/18 1849 16     Temp 09/15/18 1849 97.7 F (36.5 C)     Temp src --      SpO2 09/15/18 1849 97 %     Weight 09/15/18 1814 187 lb (84.8 kg)     Height 09/15/18 1814 5\' 5"  (1.651 m)     Head Circumference --      Peak Flow --      Pain Score 09/15/18 1814 0     Pain Loc --      Pain Edu? --      Excl. in GC? --    Constitutional: Alert and oriented. Well appearing and in no distress. Eyes: Normal exam ENT      Head: Normocephalic and atraumatic.      Mouth/Throat: Mucous membranes are moist. Cardiovascular: Normal rate, regular rhythm.  Respiratory: Normal respiratory effort without tachypnea nor retractions. Breath sounds are clear  Gastrointestinal: Soft and nontender. No distention.   Musculoskeletal: Nontender with normal range of motion in all extremities.  Neurologic:  Normal speech and language. No gross focal neurologic deficits Skin:  Skin is warm, dry and intact.  Psychiatric: Mood and affect are normal.    ____________________________________________    EKG  EKG viewed and interpreted by myself shows a normal sinus rhythm at 95 bpm with a narrow QRS, normal axis, normal intervals, nonspecific ST changes.  Largely unchanged from prior EKG.  ____________________________________________   INITIAL IMPRESSION / ASSESSMENT AND PLAN / ED COURSE  Pertinent labs & imaging results that were available during my care of the patient were reviewed by me and considered in my medical decision making (see chart for details).   Patient presents to the emergency department for hypertension and weakness today.  Blood pressure is 170/120 at home.  Currently patient appears well blood pressure 142/99.  Patient's basic lab work shows mild hyperglycemia otherwise largely within normal limits.  Given the patient's complaint of weakness I have also added on a urinalysis and we will dose IV fluids in the emergency department.  Patient states he will follow-up with his doctor on Thursday regarding his blood pressure.  Patient's remainder of his work-up is normal.  Urinalysis is normal.  Overall the patient appears well, blood pressure currently 147/96.  We will discharge from the emergency department with PCP follow-up on Thursday as scheduled.  Patient agreeable to plan of care.  Cory Campbell was evaluated in Emergency Department on 09/15/2018 for the symptoms described in the history of present illness.  He was evaluated in the context of the global COVID-19 pandemic, which necessitated consideration that the patient might be at risk for infection with the SARS-CoV-2 virus that causes COVID-19. Institutional protocols and algorithms that pertain to the evaluation of patients at risk for COVID-19 are in a state of rapid change based on information released by regulatory bodies including the CDC and federal and state organizations. These policies and algorithms were followed during the patient's care in the  ED.  ____________________________________________   FINAL CLINICAL IMPRESSION(S) / ED DIAGNOSES  Hypertension   Minna Antis, MD 09/15/18 2036

## 2018-12-02 ENCOUNTER — Ambulatory Visit
Admission: EM | Admit: 2018-12-02 | Discharge: 2018-12-02 | Disposition: A | Discharge status: Home / Self Care | Payer: Medicaid Other | Attending: Emergency Medicine | Admitting: Emergency Medicine

## 2018-12-02 ENCOUNTER — Other Ambulatory Visit: Payer: Self-pay

## 2018-12-02 DIAGNOSIS — H9212 Otorrhea, left ear: Secondary | ICD-10-CM | POA: Diagnosis present

## 2018-12-02 DIAGNOSIS — H6692 Otitis media, unspecified, left ear: Secondary | ICD-10-CM | POA: Diagnosis present

## 2018-12-02 LAB — RAPID STREP SCREEN (MED CTR MEBANE ONLY): Streptococcus, Group A Screen (Direct): NEGATIVE

## 2018-12-02 MED ORDER — OFLOXACIN 0.3 % OT SOLN: 5.0000 [drp] | Freq: Two times a day (BID) | OTIC | 0 refills | Status: AC

## 2018-12-02 NOTE — Discharge Instructions (Addendum)
Use medication as prescribed. Monitor. Follow up with your ENT this week as scheduled.   Follow up with your primary care physician this week as needed. Return to Urgent care for new or worsening concerns.

## 2018-12-02 NOTE — ED Triage Notes (Signed)
Reports left ear pain and tenderness X 2 days. Reports he also has sore throat to left side as well as "drainage to back of my throat". Pt alert and oriented X4, cooperative, RR even and unlabored, color WNL. Pt in NAD.

## 2018-12-02 NOTE — ED Provider Notes (Signed)
[facility] ____________________________________________  Time seen: Approximately 12:11 PM  I have reviewed the triage vital signs and the nursing notes.   HISTORY  Chief Complaint Otalgia and Sore Throat   HPI Cory Campbell is a 38 y.o. male past medical history of HIV, hypertension, hyperlipidemia, seizures, TIA, diabetes presenting for evaluation of left ear complaints.  Patient reports approximate 1 month ago he had bilateral tympanostomy tubes placed due to eustachian tube dysfunction.  Patient reports he was doing well.  Reports 2 to 3 days ago he began having left ear pain and intermittent drainage from his ear.  States he initially noticed a small amount of blood drainage from his ear but has since just been more clear fluid.  Continues to have ear pain.  Also has noticed some sore throat on the left side and postnasal drainage.  Denies cough, fevers, chest pain, shortness of breath.  Denies injury or hearing changes.  Has been taken intermittent over-the-counter Tylenol and Goody's powders which helps some.  Denies other aggravating alleviating factors.  Reports otherwise doing well.  Has a follow-up for his ENT this Thursday. Denies known sick contacts.   Emogene MorganAycock, Ngwe A, MD: PCP    Past Medical History:  Diagnosis Date  . Abrasion or friction burn of foot and toe(s), without mention of infection   . Acid reflux   . Adopted   . Allergic rhinitis, cause unspecified   . Anxiety   . Arthritis   . Arthrogryposis   . Asthma   . Bladder wall thickening   . BPH (benign prostatic hyperplasia)   . Chronic pain syndrome   . Depressive disorder, not elsewhere classified   . Diabetes mellitus without complication (HCC)   . Diverticulitis of colon (without mention of hemorrhage)(562.11)   . ED (erectile dysfunction)   . Herpes genitalis   . History of echocardiogram    a. 10/2016: EF 50-55%, normal wall motion  . History of stress test    a. 06/2011: significant  GI uptake artifact, no evidence of ischemia, EF 56%  . HIV infection (HCC)   . HTN (hypertension)   . Hyperlipidemia   . Hypogonadism in male   . Myalgia and myositis, unspecified   . OAB (overactive bladder)   . Obesity   . Other psoriasis   . Premature baby    born premature  . Seizure disorder (HCC)   . Sleep apnea   . 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 or other cause, without mention of thyrotoxic crisis or storm    hyperthyroidism  . TIA (transient ischemic attack)   . Type II or unspecified type diabetes mellitus with neurological manifestations, not stated as uncontrolled(250.60)     Patient Active Problem List   Diagnosis Date Noted  . Surgical wound, non healing   . Acute colitis 05/20/2017  . Colitis   . Acute renal failure (HCC) 05/15/2017  . Diarrhea 05/15/2017  . Sepsis (HCC) 05/15/2017  . Abdominal pain 11/25/2016  . Hypotension 10/08/2016  . Unstable angina (HCC) 10/06/2016  . Shortness of breath 06/23/2015  . Obesity 05/18/2014  . Frequent falls 08/12/2013  . Pain of left clavicle 08/12/2013  . Tachycardia 01/21/2013  . Obstructive sleep apnea on CPAP 06/07/2012  . Chest pain 06/20/2011  . Uncontrolled hypertension 06/20/2011  . Type 2 diabetes mellitus with complication, without long-term current use of insulin (HCC) 06/20/2011    Past Surgical History:  Procedure Laterality Date  . APPENDECTOMY    . COLOSTOMY    .  CORNEAL TRANSPLANT    . feet surgery     both  . HAND SURGERY     left and right  . leg surgery     left and right  . ORTHOPEDIC SURGERY     hands, feet, knees, legs  . tubes in ears     both     No current facility-administered medications for this encounter.   Current Outpatient Medications:  .  acyclovir (ZOVIRAX) 400 MG tablet, Take 400 mg by mouth 2 (two) times daily., Disp: , Rfl:  .  albuterol (PROVENTIL HFA;VENTOLIN HFA) 108 (90 BASE) MCG/ACT inhaler, Inhale 2 puffs into the lungs every 4 (four) hours as needed  for wheezing or shortness of breath. , Disp: , Rfl:  .  albuterol (PROVENTIL) (2.5 MG/3ML) 0.083% nebulizer solution, Take 2.5 mg by nebulization every 4 (four) hours as needed for wheezing or shortness of breath., Disp: , Rfl:  .  alfuzosin (UROXATRAL) 10 MG 24 hr tablet, Take 10 mg by mouth daily., Disp: , Rfl:  .  BIKTARVY 50-200-25 MG TABS tablet, Take 1 tablet by mouth daily., Disp: , Rfl: 1 .  Buprenorphine (BUTRANS) 15 MCG/HR PTWK, Place 15 mg onto the skin once a week. Saturday, Disp: , Rfl:  .  buPROPion (WELLBUTRIN XL) 150 MG 24 hr tablet, Take 1 tablet by mouth daily., Disp: , Rfl: 0 .  citalopram (CELEXA) 20 MG tablet, Take 20 mg by mouth daily., Disp: , Rfl:  .  diclofenac sodium (VOLTAREN) 1 % GEL, Apply 2-4 g topically 4 (four) times daily as needed. For pain., Disp: , Rfl:  .  dicyclomine (BENTYL) 20 MG tablet, Take 20 mg by mouth every 6 (six) hours., Disp: , Rfl:  .  doxycycline (VIBRA-TABS) 100 MG tablet, Take 1 tablet (100 mg total) by mouth 2 (two) times daily., Disp: 14 tablet, Rfl: 0 .  famotidine (PEPCID) 20 MG tablet, Take 20 mg by mouth daily. , Disp: , Rfl:  .  ferrous sulfate 325 (65 FE) MG tablet, Take by mouth., Disp: , Rfl:  .  finasteride (PROSCAR) 5 MG tablet, Take 5 mg by mouth daily., Disp: , Rfl:  .  fluticasone (FLOVENT HFA) 110 MCG/ACT inhaler, Inhale 2 puffs into the lungs 2 (two) times daily., Disp: , Rfl:  .  gabapentin (NEURONTIN) 300 MG capsule, Take 300 mg by mouth at bedtime., Disp: , Rfl:  .  hydrocortisone 2.5 % cream, Apply 1 application topically 2 (two) times daily as needed for itching. , Disp: , Rfl:  .  ibuprofen (ADVIL,MOTRIN) 600 MG tablet, Take 1 tablet (600 mg total) by mouth every 6 (six) hours as needed., Disp: 30 tablet, Rfl: 0 .  insulin degludec (TRESIBA FLEXTOUCH) 100 UNIT/ML SOPN FlexTouch Pen, Inject 80 Units into the skin at bedtime. , Disp: , Rfl:  .  ketoconazole (NIZORAL) 2 % shampoo, Apply 1 application topically 2 (two) times a  week. tues and thursday, Disp: , Rfl:  .  loratadine (CLARITIN) 10 MG tablet, Take 10 mg by mouth daily., Disp: , Rfl:  .  magnesium oxide (MAG-OX) 400 (241.3 Mg) MG tablet, Take 1 tablet (400 mg total) by mouth daily., Disp: 30 tablet, Rfl: 0 .  Melatonin 5 MG TABS, Take 10 mg by mouth at bedtime as needed (sleep)., Disp: , Rfl:  .  metFORMIN (GLUCOPHAGE-XR) 750 MG 24 hr tablet, Take 750 mg by mouth 2 (two) times daily., Disp: , Rfl:  .  metoprolol tartrate (LOPRESSOR) 100 MG tablet,  TAKE 1 TABLET(100 MG) BY MOUTH TWICE DAILY, Disp: 180 tablet, Rfl: 2 .  miconazole (MICOTIN) 2 % cream, Apply 1 application topically 2 (two) times daily., Disp: , Rfl:  .  mirtazapine (REMERON) 45 MG tablet, Take 45 mg by mouth at bedtime., Disp: , Rfl:  .  Multiple Vitamin (MULTIVITAMIN) tablet, Take 1 tablet by mouth daily., Disp: , Rfl:  .  naproxen (NAPROSYN) 500 MG tablet, Take 1 tablet (500 mg total) by mouth 2 (two) times daily., Disp: 30 tablet, Rfl: 0 .  nortriptyline (PAMELOR) 25 MG capsule, Take 50 mg by mouth at bedtime., Disp: , Rfl:  .  NOVOLOG FLEXPEN 100 UNIT/ML FlexPen, Inject 12 Units into the skin 3 (three) times daily with meals. With every meal and every snack, Disp: , Rfl: 4 .  nystatin-triamcinolone (MYCOLOG II) cream, Apply 1 application topically 2 (two) times daily., Disp: , Rfl:  .  omega-3 acid ethyl esters (LOVAZA) 1 g capsule, Take 1 g by mouth daily., Disp: , Rfl:  .  omeprazole (PRILOSEC) 20 MG capsule, Take 20 mg by mouth daily., Disp: , Rfl:  .  ondansetron (ZOFRAN-ODT) 4 MG disintegrating tablet, Take 4 mg by mouth every 8 (eight) hours as needed for nausea or vomiting., Disp: , Rfl:  .  phenazopyridine (PYRIDIUM) 200 MG tablet, Take 1 tablet (200 mg total) by mouth 3 (three) times daily as needed for pain., Disp: 6 tablet, Rfl: 0 .  potassium chloride SA (K-DUR,KLOR-CON) 20 MEQ tablet, take 1 tablet by mouth once daily if needed (Patient taking differently: take 1 tablet by mouth  once daily), Disp: 90 tablet, Rfl: 3 .  prednisoLONE acetate (PRED FORTE) 1 % ophthalmic suspension, Place 1 drop into the right eye daily. , Disp: , Rfl:  .  solifenacin (VESICARE) 10 MG tablet, Take 10 mg by mouth daily., Disp: , Rfl:  .  Spacer/Aero-Holding Chambers (AEROCHAMBER PLUS) inhaler, Use as instructed, Disp: 1 each, Rfl: 2 .  tiZANidine (ZANAFLEX) 4 MG capsule, Take 4 mg by mouth 3 (three) times daily as needed for muscle spasms., Disp: , Rfl:  .  trimethoprim-polymyxin b (POLYTRIM) ophthalmic solution, Place 1 drop into the right eye every 6 (six) hours. X 5 days, Disp: 10 mL, Rfl: 0 .  feeding supplement, GLUCERNA SHAKE, (GLUCERNA SHAKE) LIQD, Take 237 mLs by mouth 4 (four) times daily., Disp: , Rfl:  .  lisinopril (PRINIVIL,ZESTRIL) 40 MG tablet, Take 1 tablet (40 mg total) by mouth daily., Disp: 90 tablet, Rfl: 1 .  nitroGLYCERIN (NITROSTAT) 0.4 MG SL tablet, DISSOLVE 1 TABLET UNDER THE TONGUE EVERY 5 MINUTES AS DIRECTED AS NEEDED FOR CHEST PAIN. IF AFTER 3 DOSES, NO RELIEF, CALL 911, Disp: 25 tablet, Rfl: 0 .  ofloxacin (FLOXIN) 0.3 % OTIC solution, Place 5 drops into the left ear 2 (two) times daily for 10 days., Disp: 5 mL, Rfl: 0 .  rosuvastatin (CRESTOR) 40 MG tablet, Take 1 tablet (40 mg total) by mouth daily., Disp: 90 tablet, Rfl: 3  Allergies Penicillins and Erythromycin base  Family History  Adopted: Yes  Family history unknown: Yes    Social History Social History   Tobacco Use  . Smoking status: Former Smoker    Years: 0.00    Types: Pipe    Quit date: 01/2018    Years since quitting: 0.9  . Smokeless tobacco: Never Used  Substance Use Topics  . Alcohol use: Not Currently    Alcohol/week: 0.0 standard drinks    Comment: used  to be moderate  . Drug use: No    Review of Systems Constitutional: No fever ENT: As above.  Cardiovascular: Denies chest pain. Respiratory: Denies shortness of breath. Gastrointestinal: No abdominal pain.  Skin: No rash.     ____________________________________________   PHYSICAL EXAM:  VITAL SIGNS: ED Triage Vitals [12/02/18 1148]  Enc Vitals Group     BP (!) 166/112     Pulse Rate 91     Resp 16     Temp 98.6 F (37 C)     Temp Source Oral     SpO2 100 %     Weight 185 lb (83.9 kg)     Height  (1.676 m)     Head Circumference      Peak Flow      Pain Score 9     Pain Loc      Pain Edu?      Excl. in GC?     Constitutional: Alert and oriented. Well appearing and in no acute distress. Eyes: Conjunctivae are normal.  Head: Atraumatic. No swelling. No erythema.  Ears:  Right: Nontender, normal canal, tube present, no erythema, otherwise normal TM.  Left: Mild tenderness with auricle movement, mild drainage in ear canal, no bloody drainage, no ear canal edema, tube present, erythematous TM.  Nose:No nasal congestion   Mouth/Throat: Mucous membranes are moist.no pharyngeal erythema. No tonsillar swelling or exudate.  Hematological/Lymphatic/Immunilogical: No cervical lymphadenopathy.  Left preauricular lymphadenopathy present.  Cardiovascular: Normal rate, regular rhythm. Grossly normal heart sounds.  Good peripheral circulation. Respiratory: Normal respiratory effort.  No retractions. No wheezes, rales or rhonchi. Good air movement.  Neurologic:  Normal speech and language.  Skin:  Skin appears warm, dry and intact.  Psychiatric: Mood and affect are normal. Speech and behavior are normal. ___________________________________________   LABS (all labs ordered are listed, but only abnormal results are displayed)  Labs Reviewed  RAPID STREP SCREEN ([facility] ONLY)  CULTURE, GROUP A STREP [facility])     PROCEDURES Procedures   INITIAL IMPRESSION / ASSESSMENT AND PLAN / ED COURSE  Pertinent labs & imaging results that were available during my care of the patient were reviewed by me and considered in my medical decision making (see chart for details).  Well-appearing patient.  No  acute distress.  Left otitis media with otorrhea and tube present.  Will treat with ofloxacin drops.  Over-the-counter Tylenol as needed.  Patient directed to keep his appointment for his follow-up with ENT this week.  Strep negative, will culture.  Encourage supportive care. Discussed indication, risks and benefits of medications with patient.    Discussed follow up and return parameters including no resolution or any worsening concerns. Patient verbalized understanding and agreed to plan.   ____________________________________________   FINAL CLINICAL IMPRESSION(S) / ED DIAGNOSES  Final diagnoses:  Left otitis media, unspecified otitis media type  Otorrhea, left     ED Discharge Orders         Ordered    ofloxacin (FLOXIN) 0.3 % OTIC solution  2 times daily     12/02/18 1209           Note: This dictation was prepared with Dragon dictation along with smaller phrase technology. Any transcriptional errors that result from this process are unintentional.         Renford Dills, NP 12/02/18 1251

## 2018-12-04 LAB — CULTURE, GROUP A STREP (THRC)

## 2019-04-14 ENCOUNTER — Encounter: Payer: Self-pay | Admitting: Emergency Medicine

## 2019-04-14 ENCOUNTER — Other Ambulatory Visit: Payer: Self-pay

## 2019-04-14 ENCOUNTER — Emergency Department
Admission: EM | Admit: 2019-04-14 | Discharge: 2019-04-14 | Disposition: A | Discharge status: Home / Self Care | Payer: Medicaid Other | Attending: Emergency Medicine | Admitting: Emergency Medicine

## 2019-04-14 ENCOUNTER — Emergency Department: Payer: Medicaid Other

## 2019-04-14 DIAGNOSIS — R1031 Right lower quadrant pain: Secondary | ICD-10-CM | POA: Diagnosis not present

## 2019-04-14 DIAGNOSIS — R809 Proteinuria, unspecified: Secondary | ICD-10-CM | POA: Insufficient documentation

## 2019-04-14 DIAGNOSIS — Z794 Long term (current) use of insulin: Secondary | ICD-10-CM | POA: Insufficient documentation

## 2019-04-14 DIAGNOSIS — N50811 Right testicular pain: Secondary | ICD-10-CM | POA: Diagnosis not present

## 2019-04-14 DIAGNOSIS — I10 Essential (primary) hypertension: Secondary | ICD-10-CM | POA: Diagnosis not present

## 2019-04-14 DIAGNOSIS — R110 Nausea: Secondary | ICD-10-CM | POA: Diagnosis not present

## 2019-04-14 DIAGNOSIS — B20 Human immunodeficiency virus [HIV] disease: Secondary | ICD-10-CM | POA: Insufficient documentation

## 2019-04-14 DIAGNOSIS — E119 Type 2 diabetes mellitus without complications: Secondary | ICD-10-CM | POA: Insufficient documentation

## 2019-04-14 DIAGNOSIS — Z79899 Other long term (current) drug therapy: Secondary | ICD-10-CM | POA: Insufficient documentation

## 2019-04-14 DIAGNOSIS — Z87891 Personal history of nicotine dependence: Secondary | ICD-10-CM | POA: Diagnosis not present

## 2019-04-14 DIAGNOSIS — J45909 Unspecified asthma, uncomplicated: Secondary | ICD-10-CM | POA: Insufficient documentation

## 2019-04-14 DIAGNOSIS — R197 Diarrhea, unspecified: Secondary | ICD-10-CM | POA: Diagnosis not present

## 2019-04-14 DIAGNOSIS — R1030 Lower abdominal pain, unspecified: Secondary | ICD-10-CM | POA: Diagnosis present

## 2019-04-14 LAB — URINALYSIS, COMPLETE (UACMP) WITH MICROSCOPIC
Bacteria, UA: NONE SEEN
Bilirubin Urine: NEGATIVE
Glucose, UA: NEGATIVE mg/dL
Hgb urine dipstick: NEGATIVE
Leukocytes,Ua: NEGATIVE
Nitrite: NEGATIVE
Protein, ur: >300 mg/dL — AB
Specific Gravity, Urine: 1.025 (ref 1.005–1.030)
pH: 7 (ref 5.0–8.0)

## 2019-04-14 LAB — CHLAMYDIA/NGC RT PCR (ARMC ONLY)
Chlamydia Tr: NOT DETECTED
N gonorrhoeae: NOT DETECTED

## 2019-04-14 MED ORDER — TIZANIDINE HCL 4 MG PO CAPS: 4.0000 mg | ORAL_CAPSULE | Freq: Three times a day (TID) | ORAL | 0 refills | Status: DC | PRN

## 2019-04-14 MED ORDER — HYDROCODONE-ACETAMINOPHEN 5-325 MG PO TABS
1.0000 | ORAL_TABLET | Freq: Once | ORAL | Status: AC
  Administered 2019-04-14: 1 via ORAL
  Filled 2019-04-14: qty 1

## 2019-04-14 NOTE — ED Triage Notes (Signed)
Pt presents via [facility] with c/o groin pain that radiates right leg. Pt states this began at 930 this am. Pt denies any fever, or urinary symptoms at this time. Pt denies any strenuous activity or known injuries.

## 2019-04-14 NOTE — ED Provider Notes (Signed)
[facility] Emergency Department Provider Note ____________________________________________   First MD Initiated Contact with Patient 04/14/19 1845     (approximate)  I have reviewed the triage vital signs and the nursing notes.   HISTORY  Chief Complaint Groin Pain  HPI Cory Campbell is a 39 y.o. male who presents to the emergency department for treatment and evaluation of suprapubic pain that radiates into the right groin and testicle. Symptoms started about 9:30am. He denies urinary symptoms or concern for STD. He reports some nausea today and one episode of diarrhea last night. No relief with Tylenol.         Past Medical History:  Diagnosis Date  . Abrasion or friction burn of foot and toe(s), without mention of infection   . Acid reflux   . Adopted   . Allergic rhinitis, cause unspecified   . Anxiety   . Arthritis   . Arthrogryposis   . Asthma   . Bladder wall thickening   . BPH (benign prostatic hyperplasia)   . Chronic pain syndrome   . Depressive disorder, not elsewhere classified   . Diabetes mellitus without complication (HCC)   . Diverticulitis of colon (without mention of hemorrhage)(562.11)   . ED (erectile dysfunction)   . Herpes genitalis   . History of echocardiogram    a. 10/2016: EF 50-55%, normal wall motion  . History of stress test    a. 06/2011: significant GI uptake artifact, no evidence of ischemia, EF 56%  . HIV infection (HCC)   . HTN (hypertension)   . Hyperlipidemia   . Hypogonadism in male   . Myalgia and myositis, unspecified   . OAB (overactive bladder)   . Obesity   . Other psoriasis   . Premature baby    born premature  . Seizure disorder (HCC)   . Sleep apnea   . 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 or other cause, without mention of thyrotoxic crisis or storm    hyperthyroidism  . TIA (transient ischemic attack)   . Type II or unspecified type diabetes mellitus with neurological  manifestations, not stated as uncontrolled(250.60)     Patient Active Problem List   Diagnosis Date Noted  . Surgical wound, non healing   . Acute colitis 05/20/2017  . Colitis   . Acute renal failure (HCC) 05/15/2017  . Diarrhea 05/15/2017  . Sepsis (HCC) 05/15/2017  . Abdominal pain 11/25/2016  . Hypotension 10/08/2016  . Unstable angina (HCC) 10/06/2016  . Shortness of breath 06/23/2015  . Obesity 05/18/2014  . Frequent falls 08/12/2013  . Pain of left clavicle 08/12/2013  . Tachycardia 01/21/2013  . Obstructive sleep apnea on CPAP 06/07/2012  . Chest pain 06/20/2011  . Uncontrolled hypertension 06/20/2011  . Type 2 diabetes mellitus with complication, without long-term current use of insulin (HCC) 06/20/2011    Past Surgical History:  Procedure Laterality Date  . APPENDECTOMY    . COLOSTOMY    . CORNEAL TRANSPLANT    . feet surgery     both  . HAND SURGERY     left and right  . leg surgery     left and right  . ORTHOPEDIC SURGERY     hands, feet, knees, legs  . tubes in ears     both    Prior to Admission medications   Medication Sig Start Date End Date Taking? Authorizing Provider  acyclovir (ZOVIRAX) 400 MG tablet Take 400 mg by mouth 2 (two) times daily.  [provider]  albuterol (PROVENTIL HFA;VENTOLIN HFA) 108 (90 BASE) MCG/ACT inhaler Inhale 2 puffs into the lungs every 4 (four) hours as needed for wheezing or shortness of breath.     [provider]  albuterol (PROVENTIL) (2.5 MG/3ML) 0.083% nebulizer solution Take 2.5 mg by nebulization every 4 (four) hours as needed for wheezing or shortness of breath.    [provider]  alfuzosin (UROXATRAL) 10 MG 24 hr tablet Take 10 mg by mouth daily.    [provider]  BIKTARVY 50-200-25 MG TABS tablet Take 1 tablet by mouth daily. 11/21/16   [provider]  Buprenorphine (BUTRANS) 15 MCG/HR PTWK Place 15 mg onto the skin once a week. Saturday    [provider]  buPROPion (WELLBUTRIN XL) 150 MG 24 hr tablet Take 1 tablet by mouth daily. 11/16/16   [provider]  citalopram (CELEXA) 20 MG tablet Take 20 mg by mouth daily.    [provider]  diclofenac sodium (VOLTAREN) 1 % GEL Apply 2-4 g topically 4 (four) times daily as needed. For pain.    [provider]  dicyclomine (BENTYL) 20 MG tablet Take 20 mg by mouth every 6 (six) hours.    [provider]  doxycycline (VIBRA-TABS) 100 MG tablet Take 1 tablet (100 mg total) by mouth 2 (two) times daily. 07/15/18   Payton Mccallumonty, [place], MD  famotidine (PEPCID) 20 MG tablet Take 20 mg by mouth daily.  01/03/16   [provider]  feeding supplement, GLUCERNA SHAKE, (GLUCERNA SHAKE) LIQD Take 237 mLs by mouth 4 (four) times daily.    [provider]  ferrous sulfate 325 (65 FE) MG tablet Take by mouth.    [provider]  finasteride (PROSCAR) 5 MG tablet Take 5 mg by mouth daily.    [provider]  fluticasone (FLOVENT HFA) 110 MCG/ACT inhaler Inhale 2 puffs into the lungs 2 (two) times daily.    [provider]  gabapentin (NEURONTIN) 300 MG capsule Take 300 mg by mouth at bedtime.    [provider]  hydrocortisone 2.5 % cream Apply 1 application topically 2 (two) times daily as needed for itching.     [provider]  ibuprofen (ADVIL,MOTRIN) 600 MG tablet Take 1 tablet (600 mg total) by mouth every 6 (six) hours as needed. 05/05/18   Domenick GongMortenson, Ashley, MD  insulin degludec (TRESIBA FLEXTOUCH) 100 UNIT/ML SOPN FlexTouch Pen Inject 80 Units into the skin at bedtime.     [provider]  ketoconazole (NIZORAL) 2 % shampoo Apply 1 application topically 2 (two) times a week. tues and thursday 08/27/14   [provider]  lisinopril (PRINIVIL,ZESTRIL) 40 MG tablet Take 1 tablet (40 mg total) by mouth daily. 04/23/18 07/22/18  Antonieta IbaGollan, Timothy J, MD  loratadine (CLARITIN) 10 MG tablet Take 10 mg by  mouth daily.    [provider]  magnesium oxide (MAG-OX) 400 (241.3 Mg) MG tablet Take 1 tablet (400 mg total) by mouth daily. 05/23/17   Salary, Jetty DuhamelMontell D, MD  Melatonin 5 MG TABS Take 10 mg by mouth at bedtime as needed (sleep).    [provider]  metFORMIN (GLUCOPHAGE-XR) 750 MG 24 hr tablet Take 750 mg by mouth 2 (two) times daily.    [provider]  metoprolol tartrate (LOPRESSOR) 100 MG tablet TAKE 1 TABLET(100 MG) BY MOUTH TWICE DAILY 07/23/18   Antonieta IbaGollan, Timothy J, MD  miconazole (MICOTIN) 2 % cream Apply 1 application topically 2 (  two) times daily.    [provider]  mirtazapine (REMERON) 45 MG tablet Take 45 mg by mouth at bedtime.    [provider]  Multiple Vitamin (MULTIVITAMIN) tablet Take 1 tablet by mouth daily.    [provider]  naproxen (NAPROSYN) 500 MG tablet Take 1 tablet (500 mg total) by mouth 2 (two) times daily. 04/18/18   Candis Schatz, PA-C  nitroGLYCERIN (NITROSTAT) 0.4 MG SL tablet DISSOLVE 1 TABLET UNDER THE TONGUE EVERY 5 MINUTES AS DIRECTED AS NEEDED FOR CHEST PAIN. IF AFTER 3 DOSES, NO RELIEF, CALL 911 07/23/18   Antonieta Iba, MD  nortriptyline (PAMELOR) 25 MG capsule Take 50 mg by mouth at bedtime.    [provider]  NOVOLOG FLEXPEN 100 UNIT/ML FlexPen Inject 12 Units into the skin 3 (three) times daily with meals. With every meal and every snack 12/13/15   [provider]  nystatin-triamcinolone (MYCOLOG II) cream Apply 1 application topically 2 (two) times daily.    [provider]  omega-3 acid ethyl esters (LOVAZA) 1 g capsule Take 1 g by mouth daily.    [provider]  omeprazole (PRILOSEC) 20 MG capsule Take 20 mg by mouth daily.    [provider]  ondansetron (ZOFRAN-ODT) 4 MG disintegrating tablet Take 4 mg by mouth every 8 (eight) hours as needed for nausea or vomiting.    [provider]  phenazopyridine (PYRIDIUM) 200 MG tablet Take 1  tablet (200 mg total) by mouth 3 (three) times daily as needed for pain. 05/05/18   Domenick Gong, MD  potassium chloride SA (K-DUR,KLOR-CON) 20 MEQ tablet take 1 tablet by mouth once daily if needed Patient taking differently: take 1 tablet by mouth once daily 02/08/16   Antonieta Iba, MD  prednisoLONE acetate (PRED FORTE) 1 % ophthalmic suspension Place 1 drop into the right eye daily.     [provider]  rosuvastatin (CRESTOR) 40 MG tablet Take 1 tablet (40 mg total) by mouth daily. 05/17/18 08/15/18  Antonieta Iba, MD  solifenacin (VESICARE) 10 MG tablet Take 10 mg by mouth daily.    [provider]  Spacer/Aero-Holding Chambers (AEROCHAMBER PLUS) inhaler Use as instructed 03/23/18   Domenick Gong, MD  tiZANidine (ZANAFLEX) 4 MG capsule Take 1 capsule (4 mg total) by mouth 3 (three) times daily as needed for muscle spasms. 04/14/19   Che Rachal, Rulon Eisenmenger B, FNP  trimethoprim-polymyxin b (POLYTRIM) ophthalmic solution Place 1 drop into the right eye every 6 (six) hours. X 5 days 07/09/18   Verlee Monte, NP    Allergies Penicillins and Erythromycin base  Family History  Adopted: Yes  Family history unknown: Yes    Social History Social History   Tobacco Use  . Smoking status: Former Smoker    Years: 0.00    Types: Pipe    Quit date: 01/2018    Years since quitting: 1.2  . Smokeless tobacco: Never Used  Substance Use Topics  . Alcohol use: Not Currently    Alcohol/week: 0.0 standard drinks    Comment: used to be moderate  . Drug use: No    Review of Systems  Constitutional: No fever/chills Eyes: No visual changes. ENT: No sore throat. Cardiovascular: Denies chest pain. Respiratory: Denies shortness of breath. Gastrointestinal: No abdominal pain.  Positive nausea, no vomiting.  No diarrhea.  No constipation. Genitourinary: Negative for dysuria. Positive for suprapubic tenderness and right side groin pain. Musculoskeletal: Negative for back pain. Skin:  Negative  for rash. Neurological: Negative for headaches, focal weakness or numbness. ____________________________________________   PHYSICAL EXAM:  VITAL SIGNS: ED Triage Vitals  Enc Vitals Group     BP 04/14/19 1824 132/74     Pulse Rate 04/14/19 1824 (!) 105     Resp 04/14/19 1824 18     Temp 04/14/19 1824 98.2 F (36.8 C)     Temp Source 04/14/19 1824 Oral     SpO2 04/14/19 1824 100 %     Weight 04/14/19 1825 170 lb (77.1 kg)     Height 04/14/19 1825 5\' 5"  (1.651 m)     Head Circumference --      Peak Flow --      Pain Score 04/14/19 1825 10     Pain Loc --      Pain Edu? --      Excl. in GC? --     Constitutional: Alert and oriented. Well appearing and in no acute distress. Eyes: Conjunctivae are normal.  Head: Atraumatic. Nose: No congestion/rhinnorhea. Mouth/Throat: Mucous membranes are moist. Neck: No stridor.   Hematological/Lymphatic/Immunilogical: No cervical lymphadenopathy. Cardiovascular: Normal rate, regular rhythm. Grossly normal heart sounds.  Good peripheral circulation. Respiratory: Normal respiratory effort.  No retractions. Lungs CTAB. Gastrointestinal: Soft and nontender. No distention. No CVA tenderness. Genitourinary: No scrotal edema or erythema. No obvious inguinal hernia. Musculoskeletal: No lower extremity tenderness nor edema.  No joint effusions. Neurologic:  Normal speech and language. No gross focal neurologic deficits are appreciated. Skin:  Skin is warm, dry and intact. No rash noted. Psychiatric: Mood and affect are normal. Speech and behavior are normal.  ____________________________________________   LABS (all labs ordered are listed, but only abnormal results are displayed)  Labs Reviewed  URINALYSIS, COMPLETE (UACMP) WITH MICROSCOPIC - Abnormal; Notable for the following components:      Result Value   Ketones, ur TRACE (*)    Protein, ur >300 (*)    All other components within normal limits  CHLAMYDIA/NGC RT PCR ([facility]  ONLY)   ____________________________________________  EKG  Not indicated. ____________________________________________  RADIOLOGY  ED MD interpretation:    Scrotal 06/14/19 negative for acute findings per radiology.  Official radiology report(s): US SCROTUM DOPPLER  Result Date: 04/14/2019 CLINICAL DATA:  Right-sided testicular pain for 1 day EXAM: SCROTAL ULTRASOUND DOPPLER ULTRASOUND OF THE TESTICLES TECHNIQUE: Complete ultrasound examination of the testicles, epididymis, and other scrotal structures was performed. Color and spectral Doppler ultrasound were also utilized to evaluate blood flow to the testicles. COMPARISON:  CT from 01/17/2018 FINDINGS: Right testicle Measurements: 3.8 x 2.3 x 3.0 cm. No mass or microlithiasis visualized. Left testicle Measurements: 3.8 x 2.1 x 2.3 cm. No mass or microlithiasis visualized. Right epididymis:  Normal in size and appearance. Left epididymis:  Normal in size and appearance. Hydrocele: Small hydroceles are noted bilaterally slightly greater on the left than the right. Varicocele:  None visualized. Pulsed Doppler interrogation of both testes demonstrates normal low resistance arterial and venous waveforms bilaterally. Small lymph nodes with normal fatty hila are noted in the right inguinal region stable from the prior exam IMPRESSION: Normal-appearing testicles bilaterally. Small bilateral hydroceles left greater than right. Electronically Signed   By: 14/01/2018 M.D.   On: 04/14/2019 21:17    ____________________________________________   PROCEDURES  Procedure(s) performed (including Critical Care):  Procedures  ____________________________________________   INITIAL IMPRESSION / ASSESSMENT AND PLAN     39 year old male presenting to the emergency department for treatment and evaluation of suprapubic pain that extends  into the right testicle.  See HPI for further details.  Plan will be to get a urinalysis, send the urine for  coiling via and get an ultrasound.  DIFFERENTIAL DIAGNOSIS  Acute cystitis, groin strain, STI, hydrocele, testicular torsion  ED COURSE  Urinalysis concerning for proteinuria. Upon review of chart, this is not a new finding. He follows with nephrology and will be advised to call to request a follow up. Chlamydia and gonorrhea are negative.  Scrotal US negative for acute findings. He will be discharged home and advised to increase his dosage of Zanaflex to 3 times per day for the next few days. He is to follow up with PCP for symptoms that are not improving, change, or worsen. ____________________________________________   FINAL CLINICAL IMPRESSION(S) / ED DIAGNOSES  Final diagnoses:  Groin pain, right  Proteinuria, unspecified type     ED Discharge Orders         Ordered    tiZANidine (ZANAFLEX) 4 MG capsule  3 times daily PRN     04/14/19 2139           Mehdi Gironda Linenberger was evaluated in Emergency Department on 04/14/2019 for the symptoms described in the history of present illness. He was evaluated in the context of the global COVID-19 pandemic, which necessitated consideration that the patient might be at risk for infection with the SARS-CoV-2 virus that causes COVID-19. Institutional protocols and algorithms that pertain to the evaluation of patients at risk for COVID-19 are in a state of rapid change based on information released by regulatory bodies including the CDC and federal and state organizations. These policies and algorithms were followed during the patient's care in the ED.   Note:  This document was prepared using Dragon voice recognition software and may include unintentional dictation errors.   Victorino Dike, FNP 04/14/19 2259    Duffy Bruce, MD 04/15/19 [phone]

## 2019-04-14 NOTE — Discharge Instructions (Signed)
Proteinuria: Please call and schedule an appointment with your kidney specialist.  Groin pain: Follow up with primary care if not improving with medication. Increase your muscle relaxer to 3 per day for the next few days.  Return to the ER for symptoms that change or worsen if unable to schedule an appointment.

## 2019-04-14 NOTE — ED Notes (Signed)
See triage note  Presents with lower abd pain which moves into right groin states pain started this am    Positive nausea  No fever

## 2019-04-29 ENCOUNTER — Other Ambulatory Visit: Payer: Self-pay | Admitting: Cardiovascular Disease

## 2019-05-17 IMAGING — CT CT ABD-PELV W/ CM
2 of 4 series · 17 of 46 positions shown, 19 images · IV contrast (iopamidol)
Comparison: Plain films of earlier today.  CT of 10 days ago.

CLINICAL DATA: Abdominal pain around colostomy site. Vomiting and
diarrhea from colostomy. History of appendectomy. Diverticulitis.
HIV. Smoker.

EXAM:
CT ABDOMEN AND PELVIS WITH CONTRAST
TECHNIQUE: Multidetector CT imaging of the abdomen and pelvis was performed
using the standard protocol following bolus administration of
intravenous contrast.
CONTRAST:  100mL MSSGCR-JBB IOPAMIDOL (MSSGCR-JBB) INJECTION 61%

[Series 2: routine abd/pel with · axial · 0.89mm/px · z∈[-669,-209]mm · 14 of 101 slices shown, 16 images]
[im 5/101  soft-tissue]
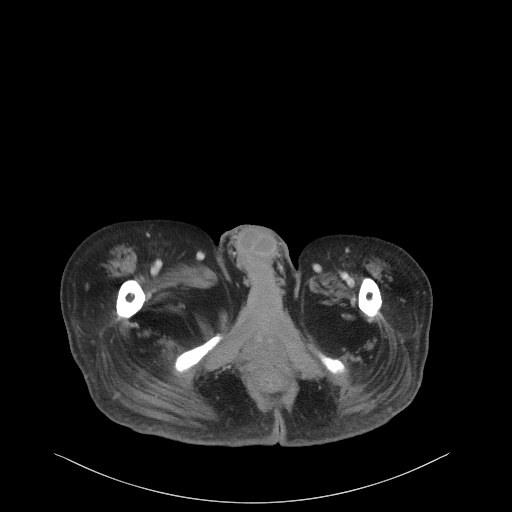
[im 5/101  bone]
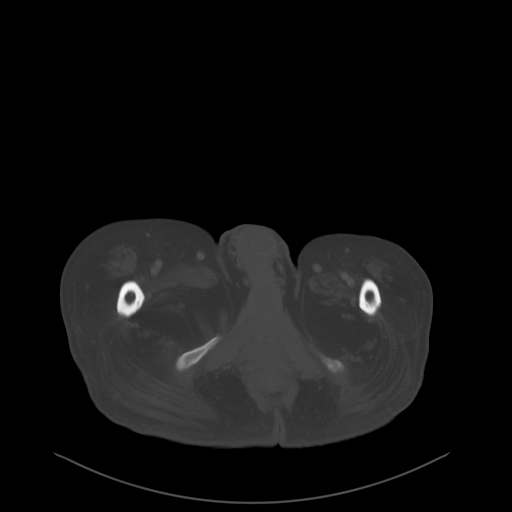
[im 13/101  soft-tissue]
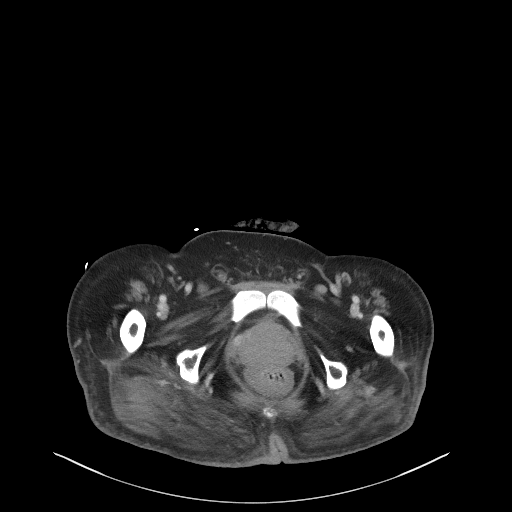
[im 21/101  soft-tissue]
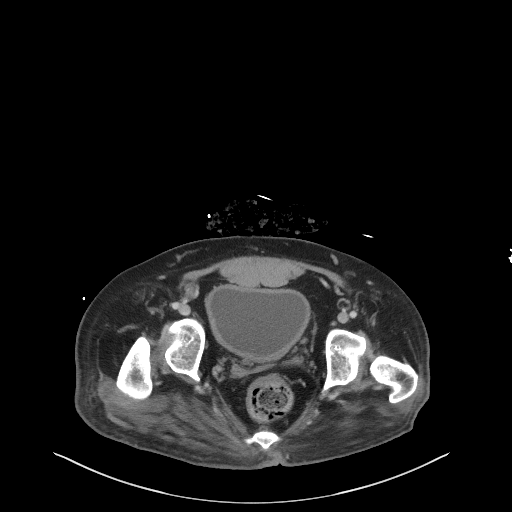
[im 29/101  soft-tissue]
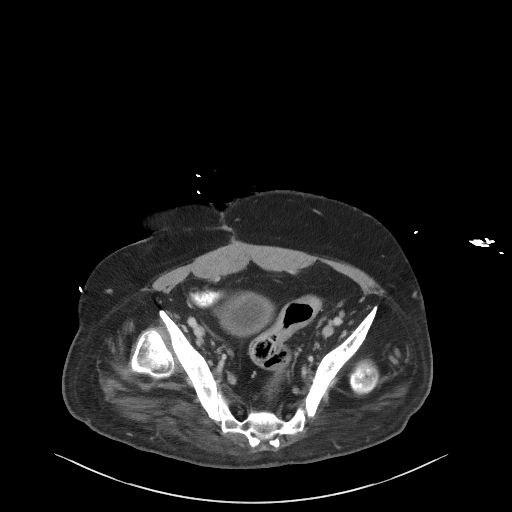
[im 33/101  soft-tissue]
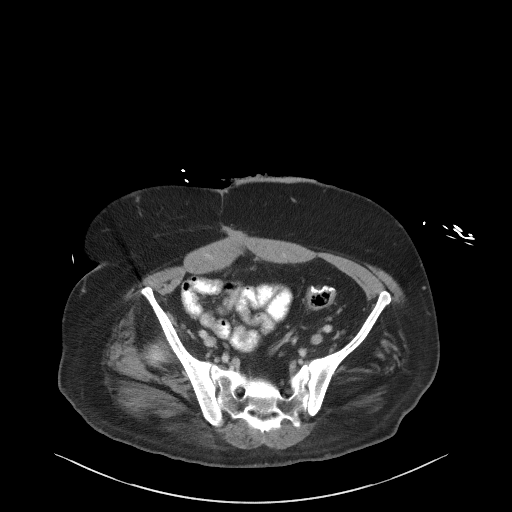
[im 41/101  soft-tissue]
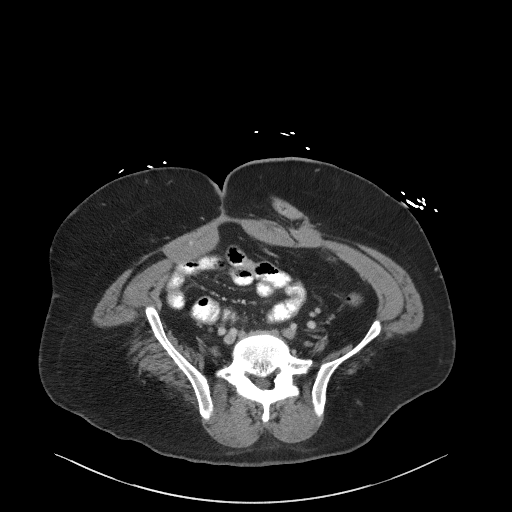
[im 49/101  soft-tissue]
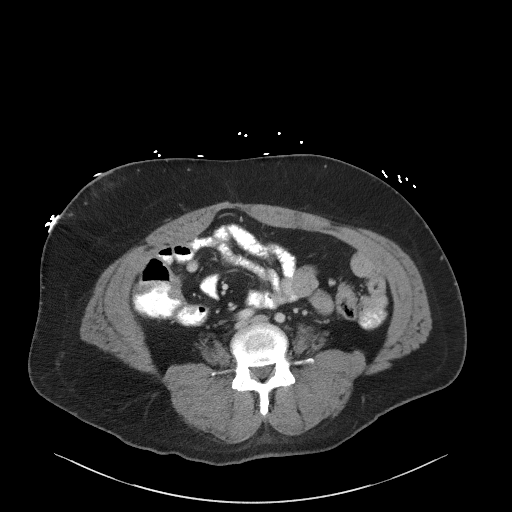
[im 53/101  soft-tissue]
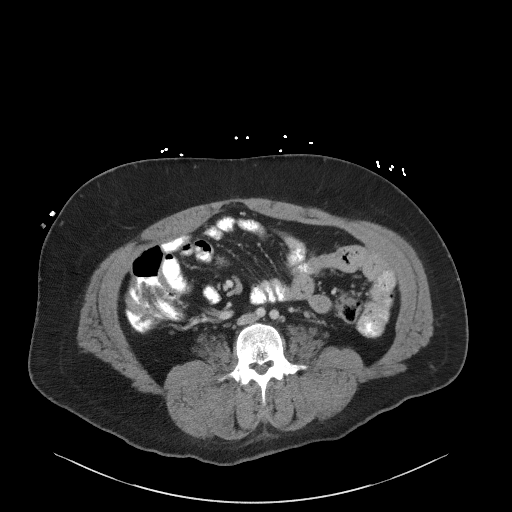
[im 61/101  soft-tissue]
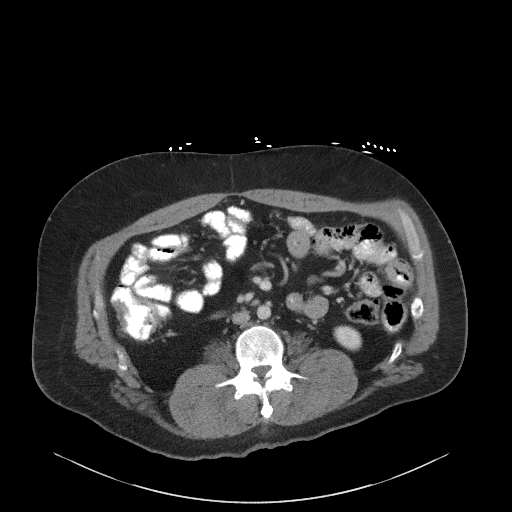
[im 61/101  bone]
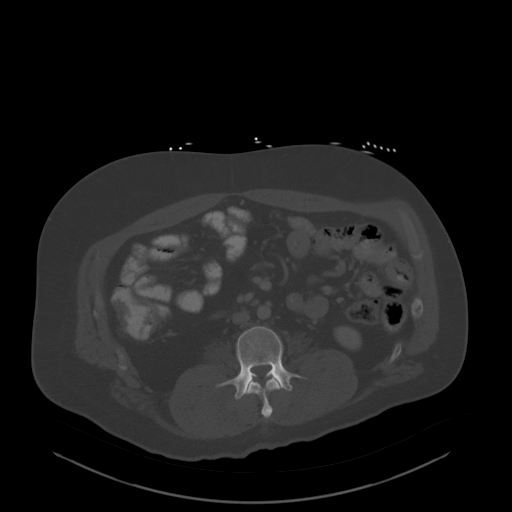
[im 69/101  soft-tissue]
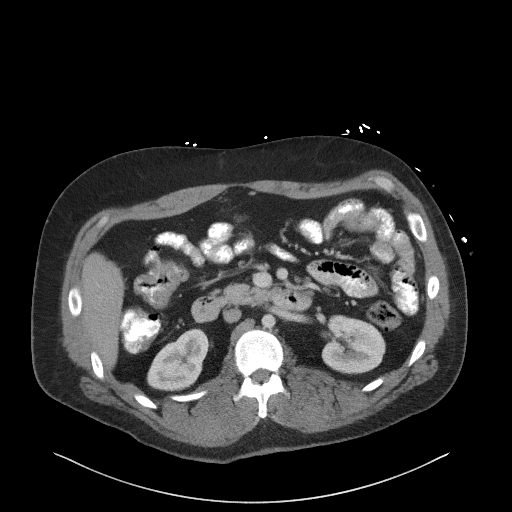
[im 77/101  soft-tissue]
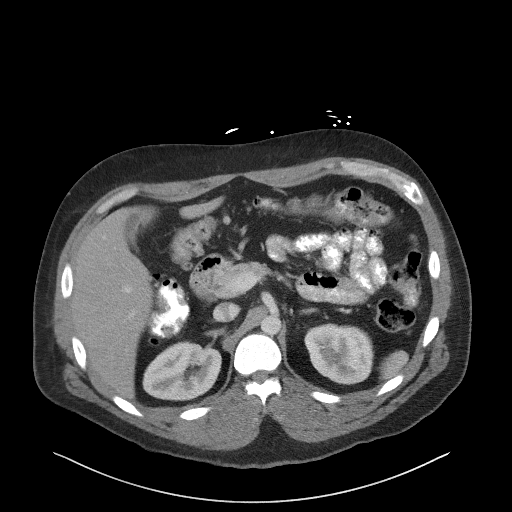
[im 81/101  soft-tissue]
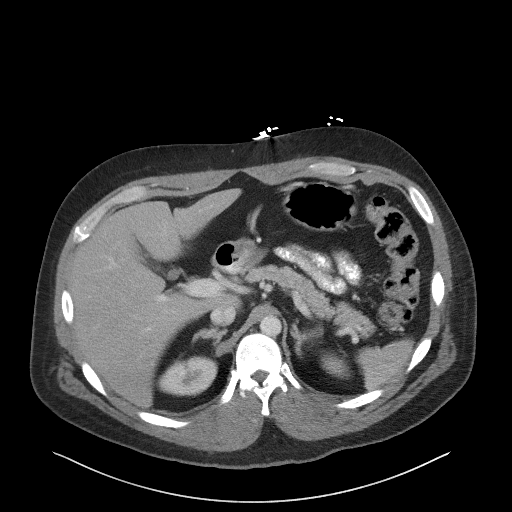
[im 89/101  soft-tissue]
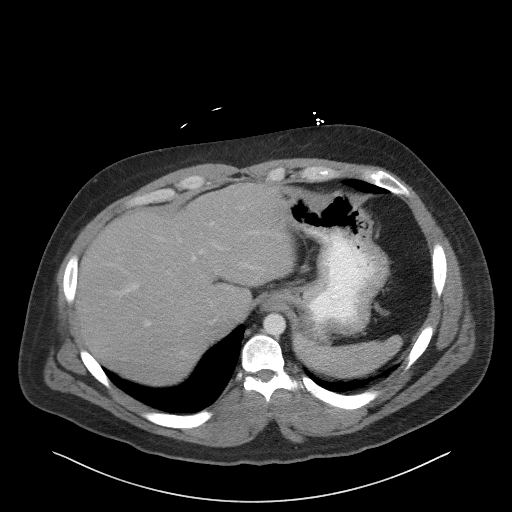
[im 97/101  soft-tissue]
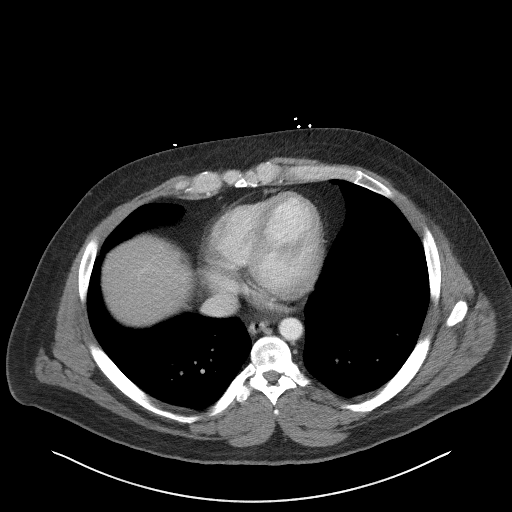

[Series 5: coronal st · coronal · 0.90mm/px · 3 of 92 slices shown]
[im 31/92  soft-tissue]
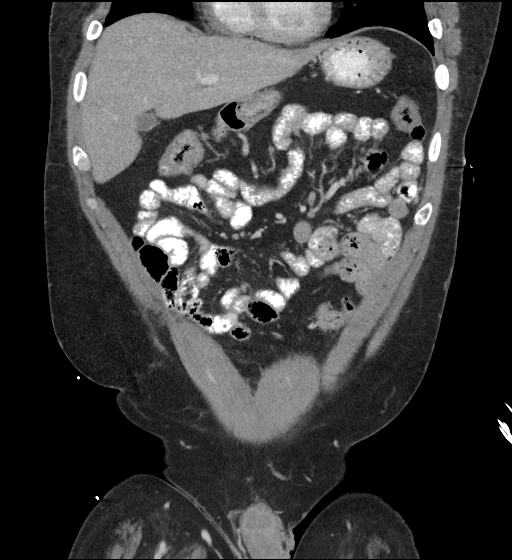
[im 41/92  soft-tissue]
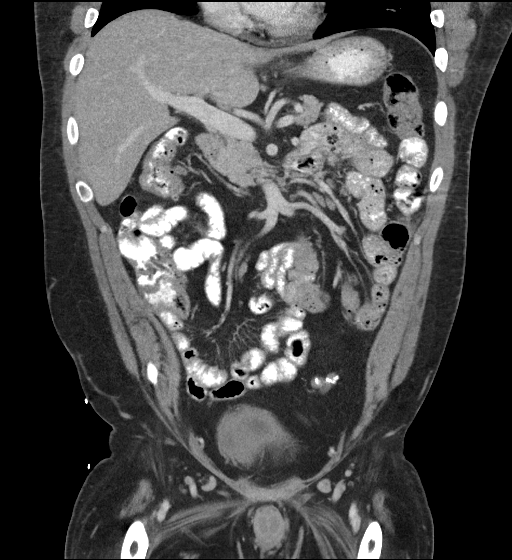
[im 51/92  soft-tissue]
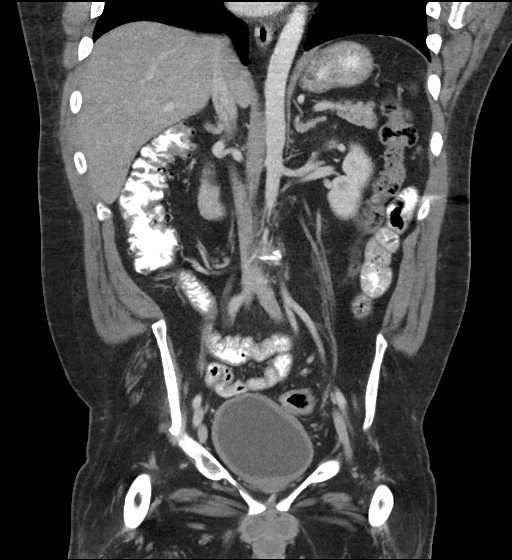

[17 of 46 positions shown; findings below may reference images not displayed]

FINDINGS: Lower chest: Clear lung bases. Normal heart size without pericardial
or pleural effusion.

Hepatobiliary: Normal liver. Multiple small gallstones without acute
cholecystitis or biliary duct dilatation.

Pancreas: Normal, without mass or ductal dilatation.

Spleen: Normal in size, without focal abnormality.

Adrenals/Urinary Tract: Normal adrenal glands. Normal kidneys,
without hydronephrosis. Normal urinary bladder.

Stomach/Bowel: Normal stomach, without wall thickening. Hartmann's
pouch and descending colostomy. No acute complication identified.
Scattered colonic diverticula. Normal terminal ileum. Normal small
bowel.

Vascular/Lymphatic: Normal caliber of the aorta and branch vessels.
No retroperitoneal or retrocrural adenopathy. Similar borderline
right external iliac adenopathy on image 79/ series [DATE] cm.
Favored to be reactive.

Reproductive: Normal prostate.

Other: No significant free fluid.  Pelvic floor laxity.

Musculoskeletal: Developmental deformity of both hips with chronic
dislocation.
IMPRESSION: 1. Left lower quadrant colostomy and Hartmann's pouch. No acute
complication.
2. Cholelithiasis.

## 2019-05-17 NOTE — Progress Notes (Signed)
Date:  05/19/2019   ID:  Cory Campbell, DOB [birth year], MRN [id number]  Patient Location:  [address] [place] [address]   Provider location:   [facility], [place] office  PCP:  Emogene Morgan, MD  Cardiologist:  Fonnie Mu  Chief Complaint  Patient presents with  . office visit    12 month F/U; Meds verbally reviewed with patient.    History of Present Illness:    Cory Campbell is a 39 y.o. male  Patient has a past medical history of Arthrogryposis multiplex congenita,  chronic pain in his legs,  asthma,  hypertension,  diabetes,  severe obstructive sleep apnea on CPAP since April 2012,  obesity,  seizure disorder,  psoriasis with several evaluations in the emergency room for chest pain on April 23, 06/01/2011.  History of frequent falls, walks with leg braces and canes Prior history of atypical chest pain, often exacerbated by falls Ostomy He presents for routine followup of his hypertension and chest pain, poorly controlled diabetes  In follow-up today reports he is doing well Wife is pregnant.  She presents with him today Expecting a boy in July 2021  Lab work reviewed HBA1C 8.2 in 2020 Trying to eat more salad, per his wife  Denies any recent falls Tolerating Crestor No recent lipid panel available  Denies chest pain or shortness of breath on exertion  Records reviewed  emergency room May 05, 2018 Urinary tract infection Bilateral low back pain Treated with Pyridium, antibiotics, Diflucan  EKG personally reviewed by myself on todays visit Shows normal sinus rhythm rate 81 bpm nonspecific T wave abnormality  Other past medical history reviewed Fall April 13, 2018 shoulder injury, seen in the ER shoulder pain Shoulder is doing better  Seen in the ER January 17, 2018 diarrhea abdominal pain Symptoms better  ostomy repair later in March 2019 at [facility]     Prior CV studies:   The following  studies were reviewed today:    Past Medical History:  Diagnosis Date  . Abrasion or friction burn of foot and toe(s), without mention of infection   . Acid reflux   . Adopted   . Allergic rhinitis, cause unspecified   . Anxiety   . Arthritis   . Arthrogryposis   . Asthma   . Bladder wall thickening   . BPH (benign prostatic hyperplasia)   . Chronic pain syndrome   . Depressive disorder, not elsewhere classified   . Diabetes mellitus without complication (HCC)   . Diverticulitis of colon (without mention of hemorrhage)(562.11)   . ED (erectile dysfunction)   . Herpes genitalis   . History of echocardiogram    a. 10/2016: EF 50-55%, normal wall motion  . History of stress test    a. 06/2011: significant GI uptake artifact, no evidence of ischemia, EF 56%  . HIV infection (HCC)   . HTN (hypertension)   . Hyperlipidemia   . Hypogonadism in male   . Myalgia and myositis, unspecified   . OAB (overactive bladder)   . Obesity   . Other psoriasis   . Premature baby    born premature  . Seizure disorder (HCC)   . Sleep apnea   . 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 or other cause, without mention of thyrotoxic crisis or storm    hyperthyroidism  . TIA (transient ischemic attack)   . Type II or unspecified type diabetes mellitus with neurological manifestations, not  stated as uncontrolled(250.60)    Past Surgical History:  Procedure Laterality Date  . APPENDECTOMY    . COLOSTOMY    . CORNEAL TRANSPLANT    . feet surgery     both  . HAND SURGERY     left and right  . leg surgery     left and right  . ORTHOPEDIC SURGERY     hands, feet, knees, legs  . tubes in ears     both     Current Meds  Medication Sig  . acyclovir (ZOVIRAX) 400 MG tablet Take 400 mg by mouth 2 (two) times daily.  Marland Kitchen albuterol (PROVENTIL HFA;VENTOLIN HFA) 108 (90 BASE) MCG/ACT inhaler Inhale 2 puffs into the lungs every 4 (four) hours as needed for wheezing or shortness of breath.   Marland Kitchen  albuterol (PROVENTIL) (2.5 MG/3ML) 0.083% nebulizer solution Take 2.5 mg by nebulization every 4 (four) hours as needed for wheezing or shortness of breath.  . alfuzosin (UROXATRAL) 10 MG 24 hr tablet Take 10 mg by mouth daily.  Marland Kitchen BIKTARVY 50-200-25 MG TABS tablet Take 1 tablet by mouth daily.  . Buprenorphine (BUTRANS) 15 MCG/HR PTWK Place 15 mg onto the skin once a week. Saturday  . buPROPion (WELLBUTRIN XL) 150 MG 24 hr tablet Take 1 tablet by mouth daily.  . citalopram (CELEXA) 20 MG tablet Take 20 mg by mouth daily.  . diclofenac sodium (VOLTAREN) 1 % GEL Apply 2-4 g topically 4 (four) times daily as needed. For pain.  Marland Kitchen dicyclomine (BENTYL) 20 MG tablet Take 20 mg by mouth every 6 (six) hours.  Marland Kitchen doxycycline (VIBRA-TABS) 100 MG tablet Take 1 tablet (100 mg total) by mouth 2 (two) times daily.  . famotidine (PEPCID) 20 MG tablet Take 20 mg by mouth daily.   . feeding supplement, GLUCERNA SHAKE, (GLUCERNA SHAKE) LIQD Take 237 mLs by mouth 4 (four) times daily.  . ferrous sulfate 325 (65 FE) MG tablet Take by mouth.  . finasteride (PROSCAR) 5 MG tablet Take 5 mg by mouth daily.  . fluticasone (FLOVENT HFA) 110 MCG/ACT inhaler Inhale 2 puffs into the lungs 2 (two) times daily.  Marland Kitchen gabapentin (NEURONTIN) 300 MG capsule Take 300 mg by mouth at bedtime.  . hydrocortisone 2.5 % cream Apply 1 application topically 2 (two) times daily as needed for itching.   . insulin degludec (TRESIBA FLEXTOUCH) 100 UNIT/ML SOPN FlexTouch Pen Inject 80 Units into the skin at bedtime.   Marland Kitchen ketoconazole (NIZORAL) 2 % shampoo Apply 1 application topically 2 (two) times a week. tues and thursday  . loratadine (CLARITIN) 10 MG tablet Take 10 mg by mouth daily.  . magnesium oxide (MAG-OX) 400 (241.3 Mg) MG tablet Take 1 tablet (400 mg total) by mouth daily.  . Melatonin 5 MG TABS Take 10 mg by mouth at bedtime as needed (sleep).  . metFORMIN (GLUCOPHAGE-XR) 750 MG 24 hr tablet Take 750 mg by mouth 2 (two) times daily.  .  metoprolol tartrate (LOPRESSOR) 100 MG tablet TAKE 1 TABLET(100 MG) BY MOUTH TWICE DAILY  . mirtazapine (REMERON) 45 MG tablet Take 45 mg by mouth at bedtime.  . Multiple Vitamin (MULTIVITAMIN) tablet Take 1 tablet by mouth daily.  . nitroGLYCERIN (NITROSTAT) 0.4 MG SL tablet DISSOLVE 1 TABLET UNDER THE TONGUE EVERY 5 MINUTES AS DIRECTED AS NEEDED FOR CHEST PAIN. IF AFTER 3 DOSES, NO RELIEF, CALL 911  . nortriptyline (PAMELOR) 25 MG capsule Take 50 mg by mouth at bedtime.  Marland Kitchen NOVOLOG FLEXPEN 100  UNIT/ML FlexPen Inject 12 Units into the skin 3 (three) times daily with meals. With every meal and every snack  . omega-3 acid ethyl esters (LOVAZA) 1 g capsule Take 1 g by mouth daily.  Marland Kitchen omeprazole (PRILOSEC) 20 MG capsule Take 20 mg by mouth daily.  . ondansetron (ZOFRAN-ODT) 4 MG disintegrating tablet Take 4 mg by mouth every 8 (eight) hours as needed for nausea or vomiting.  . potassium chloride SA (K-DUR,KLOR-CON) 20 MEQ tablet take 1 tablet by mouth once daily if needed (Patient taking differently: take 1 tablet by mouth once daily)  . prednisoLONE acetate (PRED FORTE) 1 % ophthalmic suspension Place 1 drop into the right eye daily.   . rosuvastatin (CRESTOR) 40 MG tablet TAKE 1 TABLET(40 MG) BY MOUTH DAILY  . solifenacin (VESICARE) 10 MG tablet Take 10 mg by mouth daily.  Marland Kitchen Spacer/Aero-Holding Chambers (AEROCHAMBER PLUS) inhaler Use as instructed  . tiZANidine (ZANAFLEX) 4 MG capsule Take 1 capsule (4 mg total) by mouth 3 (three) times daily as needed for muscle spasms.     Allergies:   Penicillins and Erythromycin base   Social History   Tobacco Use  . Smoking status: Former Smoker    Years: 0.00    Types: Pipe    Quit date: 01/2018    Years since quitting: 1.3  . Smokeless tobacco: Never Used  Substance Use Topics  . Alcohol use: Not Currently    Alcohol/week: 0.0 standard drinks    Comment: used to be moderate  . Drug use: No     Family Hx: The patient's He was adopted. Family  history is unknown by patient.  ROS:   Please see the history of present illness.    Review of Systems  Constitutional: Negative.   Respiratory: Negative.   Cardiovascular: Negative.   Gastrointestinal: Negative.   Musculoskeletal: Negative.        Falls, leg weakness  Neurological: Negative.   Psychiatric/Behavioral: Negative.   All other systems reviewed and are negative.    Labs/Other Tests and Data Reviewed:    Recent Labs: 09/15/2018: BUN 23; Creatinine, Ser 1.07; Hemoglobin 13.9; Platelets 230; Potassium 4.3; Sodium 138   Recent Lipid Panel Lab Results  Component Value Date/Time   CHOL 163 10/07/2016 05:03 AM   TRIG 126 10/07/2016 05:03 AM   HDL 26 (L) 10/07/2016 05:03 AM   CHOLHDL 6.3 10/07/2016 05:03 AM   LDLCALC 112 (H) 10/07/2016 05:03 AM    Wt Readings from Last 3 Encounters:  05/19/19 196 lb (88.9 kg)  04/14/19 170 lb (77.1 kg)  12/02/18 185 lb (83.9 kg)     Exam:    Vital Signs: Vital signs may also be detailed in the HPI BP 124/80 (BP Location: Left Arm, Patient Position: Sitting, Cuff Size: Normal)   Pulse 81   Ht 5\' 5"  (1.651 m)   Wt 196 lb (88.9 kg)   SpO2 97%   BMI 32.62 kg/m   Constitutional:  oriented to person, place, and time. No distress.  HENT:  Head: Grossly normal Eyes:  no discharge. No scleral icterus.  Neck: No JVD, no carotid bruits  Cardiovascular: Regular rate and rhythm, no murmurs appreciated Pulmonary/Chest: Clear to auscultation bilaterally, no wheezes or rails Abdominal: Soft.  no distension.  no tenderness.  Musculoskeletal: Normal range of motion Neurological:  normal muscle tone. Coordination normal. No atrophy Skin: Skin warm and dry Psychiatric: normal affect, pleasant  ASSESSMENT & PLAN:    Type 2 diabetes mellitus with complication, without  long-term current use of insulin (HCC) Mild improvement in his A1c Wife working aggressively on his diet Unable to exercise on a regular basis, will need to be done through  dietary changes and medications  Chest pain, unspecified type Previous history of musculoskeletal chest pain, Denies any recent chest pain symptoms, no further work-up needed  Frequent falls Long history of falls, injury No recent falls  Essential hypertension Blood pressure well controlled, no changes made to his medications  Obstructive sleep apnea on CPAP Stressed compliance with his CPAP  Shortness of breath Recommend he continue a regular exercise program as tolerated  Hyperlipidemia On Crestor, no recent lipids available   Total encounter time more than 25 minutes  Greater than 50% was spent in counseling and coordination of care with the patient    Disposition: Follow-up in 12 months   Signed, Ida Rogue, MD  05/19/2019 10:48 AM    [place] Office [address], [place], [place] [address]

## 2019-05-19 ENCOUNTER — Other Ambulatory Visit: Payer: Self-pay

## 2019-05-19 ENCOUNTER — Ambulatory Visit (INDEPENDENT_AMBULATORY_CARE_PROVIDER_SITE_OTHER): Payer: Medicaid Other | Admitting: Cardiovascular Disease

## 2019-05-19 ENCOUNTER — Encounter: Payer: Self-pay | Admitting: Cardiovascular Disease

## 2019-05-19 VITALS — BP 124/80 | HR 81 | Ht 65.0 in | Wt 196.0 lb

## 2019-05-19 DIAGNOSIS — R079 Chest pain, unspecified: Secondary | ICD-10-CM | POA: Diagnosis not present

## 2019-05-19 DIAGNOSIS — Z9989 Dependence on other enabling machines and devices: Secondary | ICD-10-CM

## 2019-05-19 DIAGNOSIS — R0602 Shortness of breath: Secondary | ICD-10-CM

## 2019-05-19 DIAGNOSIS — E118 Type 2 diabetes mellitus with unspecified complications: Secondary | ICD-10-CM

## 2019-05-19 DIAGNOSIS — I10 Essential (primary) hypertension: Secondary | ICD-10-CM | POA: Diagnosis not present

## 2019-05-19 DIAGNOSIS — G4733 Obstructive sleep apnea (adult) (pediatric): Secondary | ICD-10-CM

## 2019-05-19 DIAGNOSIS — E782 Mixed hyperlipidemia: Secondary | ICD-10-CM

## 2019-05-19 NOTE — Patient Instructions (Signed)

## 2019-07-03 ENCOUNTER — Other Ambulatory Visit: Payer: Self-pay

## 2019-07-03 ENCOUNTER — Encounter: Payer: Self-pay | Admitting: Emergency Medicine

## 2019-07-03 ENCOUNTER — Ambulatory Visit
Admission: EM | Admit: 2019-07-03 | Discharge: 2019-07-03 | Disposition: A | Discharge status: Home / Self Care | Payer: Medicaid Other | Attending: Urgent Care | Admitting: Urgent Care

## 2019-07-03 DIAGNOSIS — J3489 Other specified disorders of nose and nasal sinuses: Secondary | ICD-10-CM | POA: Diagnosis not present

## 2019-07-03 MED ORDER — MUPIROCIN 2 % EX OINT: TOPICAL_OINTMENT | CUTANEOUS | 0 refills | Status: DC

## 2019-07-03 NOTE — Discharge Instructions (Addendum)
It was very nice seeing you today in clinic. Thank you for entrusting me with your care.   Keep area clean and dry. Avoid picking at area. Apply topical antibiotic ointment TWICE a day.   Make arrangements to follow up with your regular doctor in 1 week for re-evaluation if not improving. If your symptoms/condition worsens, please seek follow up care either here or in the ER. Please remember, our [facility] providers are "right here with you" when you need [place].   Again, it was my pleasure to take care of you today. Thank you for choosing our clinic. I hope that you start to feel better quickly.   Quentin Mulling, MSN, APRN, FNP-C, CEN Advanced Practice Provider [facility]

## 2019-07-03 NOTE — ED Provider Notes (Signed)
[place], [place]   Name: Cory Campbell DOB: December 18, 1980 MRN: [id number] CSN: [id number] PCP: Emogene Morgan, MD  Arrival date and time:  07/03/19 0824  Chief Complaint:  Facial Swelling  NOTE: Prior to seeing the patient today, I have reviewed the triage nursing documentation and vital signs. Clinical staff has updated patient's PMH/PSHx, current medication list, and drug allergies/intolerances to ensure comprehensive history available to assist in medical decision making.   History:   HPI: Cory Campbell is a 38 y.o. male who presents today with complaints of a painful area inside of his RIGHT naris. Pain present x 2 days. He denies drainage or fevers. Patient has similar symptoms in the past that resolved without treatment, however patient notes that this is worse. Patient advising that he feels like the RIGHT side of his face is swelling. Despite his symptoms, patient has not taken any over the counter interventions to help improve/relieve his reported symptoms at home.   Past Medical History:  Diagnosis Date  . Abrasion or friction burn of foot and toe(s), without mention of infection   . Acid reflux   . Adopted   . Allergic rhinitis, cause unspecified   . Anxiety   . Arthritis   . Arthrogryposis   . Asthma   . Bladder wall thickening   . BPH (benign prostatic hyperplasia)   . Chronic pain syndrome   . Depressive disorder, not elsewhere classified   . Diabetes mellitus without complication (HCC)   . Diverticulitis of colon (without mention of hemorrhage)(562.11)   . ED (erectile dysfunction)   . Herpes genitalis   . History of echocardiogram    a. 10/2016: EF 50-55%, normal wall motion  . History of stress test    a. 06/2011: significant GI uptake artifact, no evidence of ischemia, EF 56%  . HIV infection (HCC)   . HTN (hypertension)   . Hyperlipidemia   . Hypogonadism in male   . Myalgia and myositis, unspecified   . OAB (overactive bladder)   . Obesity   . Other  psoriasis   . Premature baby    born premature  . Seizure disorder (HCC)   . Sleep apnea   . 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 or other cause, without mention of thyrotoxic crisis or storm    hyperthyroidism  . TIA (transient ischemic attack)   . Type II or unspecified type diabetes mellitus with neurological manifestations, not stated as uncontrolled(250.60)     Past Surgical History:  Procedure Laterality Date  . APPENDECTOMY    . COLOSTOMY    . CORNEAL TRANSPLANT    . feet surgery     both  . HAND SURGERY     left and right  . leg surgery     left and right  . ORTHOPEDIC SURGERY     hands, feet, knees, legs  . tubes in ears     both    Family History  Adopted: Yes  Family history unknown: Yes    Social History   Tobacco Use  . Smoking status: Former Smoker    Years: 0.00    Types: Pipe    Quit date: 01/2018    Years since quitting: 1.4  . Smokeless tobacco: Never Used  Substance Use Topics  . Alcohol use: Not Currently    Alcohol/week: 0.0 standard drinks    Comment: used to be moderate  . Drug use: No    Patient Active Problem List   Diagnosis Date Noted  .  Surgical wound, non healing   . Acute colitis 05/20/2017  . Colitis   . Acute renal failure (HCC) 05/15/2017  . Diarrhea 05/15/2017  . Sepsis (HCC) 05/15/2017  . Abdominal pain 11/25/2016  . Hypotension 10/08/2016  . Unstable angina (HCC) 10/06/2016  . Shortness of breath 06/23/2015  . Obesity 05/18/2014  . Frequent falls 08/12/2013  . Pain of left clavicle 08/12/2013  . Tachycardia 01/21/2013  . Obstructive sleep apnea on CPAP 06/07/2012  . Chest pain 06/20/2011  . Uncontrolled hypertension 06/20/2011  . Type 2 diabetes mellitus with complication, without long-term current use of insulin (HCC) 06/20/2011    Home Medications:    Current Meds  Medication Sig  . acyclovir (ZOVIRAX) 400 MG tablet Take 400 mg by mouth 2 (two) times daily.  Marland Kitchen albuterol (PROVENTIL HFA;VENTOLIN HFA)  108 (90 BASE) MCG/ACT inhaler Inhale 2 puffs into the lungs every 4 (four) hours as needed for wheezing or shortness of breath.   Marland Kitchen albuterol (PROVENTIL) (2.5 MG/3ML) 0.083% nebulizer solution Take 2.5 mg by nebulization every 4 (four) hours as needed for wheezing or shortness of breath.  . alfuzosin (UROXATRAL) 10 MG 24 hr tablet Take 10 mg by mouth daily.  Marland Kitchen BIKTARVY 50-200-25 MG TABS tablet Take 1 tablet by mouth daily.  . Buprenorphine (BUTRANS) 15 MCG/HR PTWK Place 15 mg onto the skin once a week. Saturday  . buPROPion (WELLBUTRIN XL) 150 MG 24 hr tablet Take 1 tablet by mouth daily.  . citalopram (CELEXA) 20 MG tablet Take 20 mg by mouth daily.  . diclofenac sodium (VOLTAREN) 1 % GEL Apply 2-4 g topically 4 (four) times daily as needed. For pain.  Marland Kitchen dicyclomine (BENTYL) 20 MG tablet Take 20 mg by mouth every 6 (six) hours.  . famotidine (PEPCID) 20 MG tablet Take 20 mg by mouth daily.   . feeding supplement, GLUCERNA SHAKE, (GLUCERNA SHAKE) LIQD Take 237 mLs by mouth 4 (four) times daily.  . ferrous sulfate 325 (65 FE) MG tablet Take by mouth.  . finasteride (PROSCAR) 5 MG tablet Take 5 mg by mouth daily.  . fluticasone (FLOVENT HFA) 110 MCG/ACT inhaler Inhale 2 puffs into the lungs 2 (two) times daily.  Marland Kitchen gabapentin (NEURONTIN) 300 MG capsule Take 300 mg by mouth at bedtime.  . hydrocortisone 2.5 % cream Apply 1 application topically 2 (two) times daily as needed for itching.   Marland Kitchen ibuprofen (ADVIL,MOTRIN) 600 MG tablet Take 1 tablet (600 mg total) by mouth every 6 (six) hours as needed.  . insulin degludec (TRESIBA FLEXTOUCH) 100 UNIT/ML SOPN FlexTouch Pen Inject 80 Units into the skin at bedtime.   Marland Kitchen ketoconazole (NIZORAL) 2 % shampoo Apply 1 application topically 2 (two) times a week. tues and thursday  . loratadine (CLARITIN) 10 MG tablet Take 10 mg by mouth daily.  . magnesium oxide (MAG-OX) 400 (241.3 Mg) MG tablet Take 1 tablet (400 mg total) by mouth daily.  . Melatonin 5 MG TABS  Take 10 mg by mouth at bedtime as needed (sleep).  . metFORMIN (GLUCOPHAGE-XR) 750 MG 24 hr tablet Take 750 mg by mouth 2 (two) times daily.  . metoprolol tartrate (LOPRESSOR) 100 MG tablet TAKE 1 TABLET(100 MG) BY MOUTH TWICE DAILY  . miconazole (MICOTIN) 2 % cream Apply 1 application topically 2 (two) times daily.  . mirtazapine (REMERON) 45 MG tablet Take 45 mg by mouth at bedtime.  . Multiple Vitamin (MULTIVITAMIN) tablet Take 1 tablet by mouth daily.  . naproxen (NAPROSYN) 500 MG tablet  Take 1 tablet (500 mg total) by mouth 2 (two) times daily.  . nortriptyline (PAMELOR) 25 MG capsule Take 50 mg by mouth at bedtime.  Marland Kitchen NOVOLOG FLEXPEN 100 UNIT/ML FlexPen Inject 12 Units into the skin 3 (three) times daily with meals. With every meal and every snack  . nystatin-triamcinolone (MYCOLOG II) cream Apply 1 application topically 2 (two) times daily.  Marland Kitchen omega-3 acid ethyl esters (LOVAZA) 1 g capsule Take 1 g by mouth daily.  Marland Kitchen omeprazole (PRILOSEC) 20 MG capsule Take 20 mg by mouth daily.  . ondansetron (ZOFRAN-ODT) 4 MG disintegrating tablet Take 4 mg by mouth every 8 (eight) hours as needed for nausea or vomiting.  . potassium chloride SA (K-DUR,KLOR-CON) 20 MEQ tablet take 1 tablet by mouth once daily if needed (Patient taking differently: take 1 tablet by mouth once daily)  . prednisoLONE acetate (PRED FORTE) 1 % ophthalmic suspension Place 1 drop into the right eye daily.   . rosuvastatin (CRESTOR) 40 MG tablet TAKE 1 TABLET(40 MG) BY MOUTH DAILY  . solifenacin (VESICARE) 10 MG tablet Take 10 mg by mouth daily.  Marland Kitchen tiZANidine (ZANAFLEX) 4 MG capsule Take 1 capsule (4 mg total) by mouth 3 (three) times daily as needed for muscle spasms.    Allergies:   Penicillins and Erythromycin base  Review of Systems (ROS):  Review of systems NEGATIVE unless otherwise noted in narrative H&P section.   Vital Signs: Today's Vitals   07/03/19 0848 07/03/19 0854 07/03/19 0918  BP:  121/76   Pulse:  79    Resp:  18   Temp:  98 F (36.7 C)   TempSrc:  Oral   SpO2:  100%   Weight: 185 lb (83.9 kg)    Height: 5\' 6"  (1.676 m)    PainSc: 10-Worst pain ever  10-Worst pain ever    Physical Exam: Physical Exam  Constitutional: He is oriented to person, place, and time and well-developed, well-nourished, and in no distress.  HENT:  Head: Normocephalic and atraumatic.  Nose: No epistaxis.  Mouth/Throat: Uvula is midline, oropharynx is clear and moist and mucous membranes are normal.  NOSE: (+) area of erythema noted within RIGHT naris; no drainage or bleeding. (+) scabbing noted. No evidence of facial tenderness or associated swelling.   Eyes: Pupils are equal, round, and reactive to light.  Cardiovascular: Normal rate, regular rhythm, normal heart sounds and intact distal pulses.  Pulmonary/Chest: Effort normal and breath sounds normal.  Neurological: He is alert and oriented to person, place, and time.  Skin: Skin is warm and dry. No rash noted. He is not diaphoretic.  Psychiatric: Memory, affect and judgment normal. His mood appears anxious.  Nursing note and vitals reviewed.   Urgent Care Treatments / Results:   No orders of the defined types were placed in this encounter.   LABS: PLEASE NOTE: all labs that were ordered this encounter are listed, however only abnormal results are displayed. Labs Reviewed - No data to display  EKG: -None  RADIOLOGY: No results found.  PROCEDURES: Procedures  MEDICATIONS RECEIVED THIS VISIT: Medications - No data to display  PERTINENT CLINICAL COURSE NOTES/UPDATES:   Initial Impression / Assessment and Plan / Urgent Care Course:  Pertinent labs & imaging results that were available during my care of the patient were personally reviewed by me and considered in my medical decision making (see lab/imaging section of note for values and interpretations).  Cory Campbell is a 39 y.o. male who [facility] to [facility] today with  complaints of Facial Swelling  Patient is well appearing overall in clinic today. He does not appear to be in any acute distress. Presenting symptoms (see HPI) and exam as documented above. No evidence of facial swelling. Treating with topical mupirocin. Patient encouraged not to pick at area. May use external warm compresses to nose to help with pain. May use Tylenol and/or Ibuprofen as needed for discomfort.  Discussed follow up with primary care physician in 1 week for re-evaluation. I have reviewed the follow up and strict return precautions for any new or worsening symptoms. Patient is aware of symptoms that would be deemed urgent/emergent, and would thus require further evaluation either here or in the emergency department. At the time of discharge, he verbalized understanding and consent with the discharge plan as it was reviewed with him. All questions were fielded by provider and/or clinic staff prior to patient discharge.    Final Clinical Impressions / Urgent Care Diagnoses:   Final diagnoses:  Sore in nose  Painful nose    New Prescriptions:  [place] Controlled Substance Registry consulted? Not Applicable  Meds ordered this encounter  Medications  . mupirocin ointment (BACTROBAN) 2 %    Sig: AAA BID x 5 days, then PRN    Dispense:  15 g    Refill:  0    Recommended Follow up Care:  Patient encouraged to follow up with the following provider within the specified time frame, or sooner as dictated by the severity of his symptoms. As always, he was instructed that for any urgent/emergent care needs, he should seek care either here or in the emergency department for more immediate evaluation.  Follow-up Information    Aycock, Ngwe A, MD In 1 week.   Specialty: Family Medicine Why: General reassessment of symptoms if not improving Contact information: [address] [place] [address] [phone]         NOTE: This note was prepared using Dragon dictation software  along with smaller phrase technology. Despite my best ability to proofread, there is the potential that transcriptional errors may still occur from this process, and are completely unintentional.    Karen Kitchens, NP 07/04/19 [phone]

## 2019-07-03 NOTE — ED Triage Notes (Signed)
Patient c/o sore on the inside of his right nostril that he noticed 2 days ago. He states he feels like the right side of his face is swelling from this.

## 2019-07-30 ENCOUNTER — Other Ambulatory Visit: Payer: Self-pay | Admitting: Cardiovascular Disease

## 2019-11-04 ENCOUNTER — Other Ambulatory Visit: Payer: Self-pay | Admitting: Cardiovascular Disease

## 2020-01-08 ENCOUNTER — Emergency Department: Payer: Medicaid Other

## 2020-01-08 ENCOUNTER — Emergency Department
Admission: EM | Admit: 2020-01-08 | Discharge: 2020-01-08 | Disposition: A | Discharge status: Home / Self Care | Payer: Medicaid Other | Attending: Student in an Organized Health Care Education/Training Program | Admitting: Student in an Organized Health Care Education/Training Program

## 2020-01-08 ENCOUNTER — Other Ambulatory Visit: Payer: Self-pay

## 2020-01-08 DIAGNOSIS — R1084 Generalized abdominal pain: Secondary | ICD-10-CM | POA: Insufficient documentation

## 2020-01-08 DIAGNOSIS — Z7951 Long term (current) use of inhaled steroids: Secondary | ICD-10-CM | POA: Diagnosis not present

## 2020-01-08 DIAGNOSIS — Z79899 Other long term (current) drug therapy: Secondary | ICD-10-CM | POA: Insufficient documentation

## 2020-01-08 DIAGNOSIS — Z87891 Personal history of nicotine dependence: Secondary | ICD-10-CM | POA: Diagnosis not present

## 2020-01-08 DIAGNOSIS — E1149 Type 2 diabetes mellitus with other diabetic neurological complication: Secondary | ICD-10-CM | POA: Insufficient documentation

## 2020-01-08 DIAGNOSIS — I10 Essential (primary) hypertension: Secondary | ICD-10-CM | POA: Diagnosis not present

## 2020-01-08 DIAGNOSIS — Z794 Long term (current) use of insulin: Secondary | ICD-10-CM | POA: Insufficient documentation

## 2020-01-08 DIAGNOSIS — J45909 Unspecified asthma, uncomplicated: Secondary | ICD-10-CM | POA: Insufficient documentation

## 2020-01-08 LAB — CBC
HCT: 40 % (ref 39.0–52.0)
Hemoglobin: 12.9 g/dL — ABNORMAL LOW (ref 13.0–17.0)
MCH: 27.8 pg (ref 26.0–34.0)
MCHC: 32.3 g/dL (ref 30.0–36.0)
MCV: 86.2 fL (ref 80.0–100.0)
Platelets: 247 10*3/uL (ref 150–400)
RBC: 4.64 MIL/uL (ref 4.22–5.81)
RDW: 14 % (ref 11.5–15.5)
WBC: 5.2 10*3/uL (ref 4.0–10.5)
nRBC: 0 % (ref 0.0–0.2)

## 2020-01-08 LAB — URINALYSIS, COMPLETE (UACMP) WITH MICROSCOPIC
Bilirubin Urine: NEGATIVE
Glucose, UA: >=500 mg/dL — AB
Hgb urine dipstick: NEGATIVE
Ketones, ur: NEGATIVE mg/dL
Leukocytes,Ua: NEGATIVE
Nitrite: NEGATIVE
Protein, ur: 30 mg/dL — AB
Specific Gravity, Urine: 1.031 — ABNORMAL HIGH (ref 1.005–1.030)
pH: 5 (ref 5.0–8.0)

## 2020-01-08 LAB — COMPREHENSIVE METABOLIC PANEL
ALT: 26 U/L (ref 0–44)
AST: 21 U/L (ref 15–41)
Albumin: 4.2 g/dL (ref 3.5–5.0)
Alkaline Phosphatase: 65 U/L (ref 38–126)
Anion gap: 12 (ref 5–15)
BUN: 26 mg/dL — ABNORMAL HIGH (ref 6–20)
CO2: 27 mmol/L (ref 22–32)
Calcium: 9.5 mg/dL (ref 8.9–10.3)
Chloride: 101 mmol/L (ref 98–111)
Creatinine, Ser: 1.71 mg/dL — ABNORMAL HIGH (ref 0.61–1.24)
GFR, Estimated: 51 mL/min — ABNORMAL LOW (ref >60)
Glucose, Bld: 154 mg/dL — ABNORMAL HIGH (ref 70–99)
Potassium: 4.3 mmol/L (ref 3.5–5.1)
Sodium: 140 mmol/L (ref 135–145)
Total Bilirubin: 0.6 mg/dL (ref 0.3–1.2)
Total Protein: 7.4 g/dL (ref 6.5–8.1)

## 2020-01-08 LAB — LIPASE, BLOOD: Lipase: 44 U/L (ref 11–51)

## 2020-01-08 MED ORDER — HYDROCODONE-ACETAMINOPHEN 5-325 MG PO TABS
1.0000 | ORAL_TABLET | Freq: Once | ORAL | Status: AC
  Administered 2020-01-08: 1 via ORAL
  Filled 2020-01-08: qty 1

## 2020-01-08 MED ORDER — IOHEXOL 300 MG/ML  SOLN
100.0000 mL | Freq: Once | INTRAMUSCULAR | Status: AC | PRN
  Administered 2020-01-08: 100 mL via INTRAVENOUS
  Filled 2020-01-08: qty 100

## 2020-01-08 MED ORDER — DICYCLOMINE HCL 10 MG PO CAPS: 10.0000 mg | ORAL_CAPSULE | Freq: Three times a day (TID) | ORAL | 0 refills | Status: DC | PRN

## 2020-01-08 MED ORDER — ONDANSETRON HCL 4 MG/2ML IJ SOLN
4.0000 mg | Freq: Once | INTRAMUSCULAR | Status: AC
  Administered 2020-01-08: 4 mg via INTRAVENOUS
  Filled 2020-01-08: qty 2

## 2020-01-08 MED ORDER — MORPHINE SULFATE (PF) 4 MG/ML IV SOLN
4.0000 mg | INTRAVENOUS | Status: DC | PRN
  Administered 2020-01-08: 4 mg via INTRAVENOUS
  Filled 2020-01-08: qty 1

## 2020-01-08 NOTE — ED Triage Notes (Signed)
Pt to ED via ems from home for chief complaint of RLQ  Pain below belly button x2 days with radiation to back.  Denies N/V, fever, diarrhea.  Pt in NAD , RR even and unlabored

## 2020-01-08 NOTE — ED Triage Notes (Signed)
Pt comes into the ED via [facility] from home c/o RLQ abdominal pain x 2 days.  Pt has h/o kidney stones but states it feels different.  Denies any trauma to the abdomen but does have pain with palpation.  CBG yesterday was 280, with EMS it was 199.  Pt denies any N/V/D. Pt in NAD at this time with even and unlabored respirations.

## 2020-01-08 NOTE — ED Provider Notes (Signed)
[facility] Emergency Department Provider Note    First MD Initiated Contact with Patient 01/08/20 1319     (approximate)  I have reviewed the triage vital signs and the nursing notes.   HISTORY  Chief Complaint Abdominal Pain    HPI Cory Campbell is a 39 y.o. male the below listed past medical history presents to the ER for evaluation of generalized abdominal pain primarily in the periumbilical location and suprapubic area for the past 2 days.  Does have complex past medical history with multiple previous surgeries status post appendectomy as well as history of diverticulitis.  Denies any bloody stools.  No flank pain.  No measured fevers.    Past Medical History:  Diagnosis Date  . Abrasion or friction burn of foot and toe(s), without mention of infection   . Acid reflux   . Adopted   . Allergic rhinitis, cause unspecified   . Anxiety   . Arthritis   . Arthrogryposis   . Asthma   . Bladder wall thickening   . BPH (benign prostatic hyperplasia)   . Chronic pain syndrome   . Depressive disorder, not elsewhere classified   . Diabetes mellitus without complication (HCC)   . Diverticulitis of colon (without mention of hemorrhage)(562.11)   . ED (erectile dysfunction)   . Herpes genitalis   . History of echocardiogram    a. 10/2016: EF 50-55%, normal wall motion  . History of stress test    a. 06/2011: significant GI uptake artifact, no evidence of ischemia, EF 56%  . HIV infection (HCC)   . HTN (hypertension)   . Hyperlipidemia   . Hypogonadism in male   . Myalgia and myositis, unspecified   . OAB (overactive bladder)   . Obesity   . Other psoriasis   . Premature baby    born premature  . Seizure disorder (HCC)   . Sleep apnea   . 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 or other cause, without mention of thyrotoxic crisis or storm    hyperthyroidism  . TIA (transient ischemic attack)   . Type II or unspecified type diabetes mellitus  with neurological manifestations, not stated as uncontrolled(250.60)    Family History  Adopted: Yes  Family history unknown: Yes   Past Surgical History:  Procedure Laterality Date  . APPENDECTOMY    . COLOSTOMY    . CORNEAL TRANSPLANT    . feet surgery     both  . HAND SURGERY     left and right  . leg surgery     left and right  . ORTHOPEDIC SURGERY     hands, feet, knees, legs  . tubes in ears     both   Patient Active Problem List   Diagnosis Date Noted  . Surgical wound, non healing   . Acute colitis 05/20/2017  . Colitis   . Acute renal failure (HCC) 05/15/2017  . Diarrhea 05/15/2017  . Sepsis (HCC) 05/15/2017  . Abdominal pain 11/25/2016  . Hypotension 10/08/2016  . Unstable angina (HCC) 10/06/2016  . Shortness of breath 06/23/2015  . Obesity 05/18/2014  . Frequent falls 08/12/2013  . Pain of left clavicle 08/12/2013  . Tachycardia 01/21/2013  . Obstructive sleep apnea on CPAP 06/07/2012  . Chest pain 06/20/2011  . Uncontrolled hypertension 06/20/2011  . Type 2 diabetes mellitus with complication, without long-term current use of insulin (HCC) 06/20/2011      Prior to Admission medications   Medication Sig Start Date End  Date Taking? Authorizing Provider  acyclovir (ZOVIRAX) 400 MG tablet Take 400 mg by mouth 2 (two) times daily.    [provider]  albuterol (PROVENTIL HFA;VENTOLIN HFA) 108 (90 BASE) MCG/ACT inhaler Inhale 2 puffs into the lungs every 4 (four) hours as needed for wheezing or shortness of breath.     [provider]  albuterol (PROVENTIL) (2.5 MG/3ML) 0.083% nebulizer solution Take 2.5 mg by nebulization every 4 (four) hours as needed for wheezing or shortness of breath.    [provider]  alfuzosin (UROXATRAL) 10 MG 24 hr tablet Take 10 mg by mouth daily.    [provider]  BIKTARVY 50-200-25 MG TABS tablet Take 1 tablet by mouth daily. 11/21/16   [provider]  Buprenorphine (BUTRANS) 15  MCG/HR PTWK Place 15 mg onto the skin once a week. Saturday    [provider]  buPROPion (WELLBUTRIN XL) 150 MG 24 hr tablet Take 1 tablet by mouth daily. 11/16/16   [provider]  citalopram (CELEXA) 20 MG tablet Take 20 mg by mouth daily.    [provider]  diclofenac sodium (VOLTAREN) 1 % GEL Apply 2-4 g topically 4 (four) times daily as needed. For pain.    [provider]  dicyclomine (BENTYL) 10 MG capsule Take 1 capsule (10 mg total) by mouth 3 (three) times daily as needed for up to 14 days for spasms. 01/08/20 01/22/20  Willy Eddy, MD  dicyclomine (BENTYL) 20 MG tablet Take 20 mg by mouth every 6 (six) hours.    [provider]  famotidine (PEPCID) 20 MG tablet Take 20 mg by mouth daily.  01/03/16   [provider]  feeding supplement, GLUCERNA SHAKE, (GLUCERNA SHAKE) LIQD Take 237 mLs by mouth 4 (four) times daily.    [provider]  ferrous sulfate 325 (65 FE) MG tablet Take by mouth.    [provider]  finasteride (PROSCAR) 5 MG tablet Take 5 mg by mouth daily.    [provider]  fluticasone (FLOVENT HFA) 110 MCG/ACT inhaler Inhale 2 puffs into the lungs 2 (two) times daily.    [provider]  gabapentin (NEURONTIN) 300 MG capsule Take 300 mg by mouth at bedtime.    [provider]  hydrocortisone 2.5 % cream Apply 1 application topically 2 (two) times daily as needed for itching.     [provider]  ibuprofen (ADVIL,MOTRIN) 600 MG tablet Take 1 tablet (600 mg total) by mouth every 6 (six) hours as needed. 05/05/18   Domenick Gong, MD  insulin degludec (TRESIBA FLEXTOUCH) 100 UNIT/ML SOPN FlexTouch Pen Inject 80 Units into the skin at bedtime.     [provider]  ketoconazole (NIZORAL) 2 % shampoo Apply 1 application topically 2 (two) times a week. tues and thursday 08/27/14   [provider]  lisinopril (PRINIVIL,ZESTRIL) 40 MG tablet Take 1  tablet (40 mg total) by mouth daily. 04/23/18 07/22/18  Antonieta Iba, MD  loratadine (CLARITIN) 10 MG tablet Take 10 mg by mouth daily.    [provider]  magnesium oxide (MAG-OX) 400 (241.3 Mg) MG tablet Take 1 tablet (400 mg total) by mouth daily. 05/23/17   Salary, Jetty Duhamel D, MD  Melatonin 5 MG TABS Take 10 mg by mouth at bedtime as needed (sleep).    [provider]  metFORMIN (GLUCOPHAGE-XR) 750 MG 24 hr tablet Take 750 mg by mouth 2 (two) times daily.    [provider]  metoprolol  tartrate (LOPRESSOR) 100 MG tablet TAKE 1 TABLET(100 MG) BY MOUTH TWICE DAILY 11/05/19   Antonieta Iba, MD  miconazole (MICOTIN) 2 % cream Apply 1 application topically 2 (two) times daily.    [provider]  mirtazapine (REMERON) 45 MG tablet Take 45 mg by mouth at bedtime.    [provider]  Multiple Vitamin (MULTIVITAMIN) tablet Take 1 tablet by mouth daily.    [provider]  mupirocin ointment (BACTROBAN) 2 % AAA BID x 5 days, then PRN 07/03/19   Verlee Monte, NP  naproxen (NAPROSYN) 500 MG tablet Take 1 tablet (500 mg total) by mouth 2 (two) times daily. 04/18/18   Candis Schatz, PA-C  nitroGLYCERIN (NITROSTAT) 0.4 MG SL tablet DISSOLVE 1 TABLET UNDER THE TONGUE EVERY 5 MINUTES AS DIRECTED AS NEEDED FOR CHEST PAIN. IF AFTER 3 DOSES, NO RELIEF, CALL 911 07/23/18   Antonieta Iba, MD  nortriptyline (PAMELOR) 25 MG capsule Take 50 mg by mouth at bedtime.    [provider]  NOVOLOG FLEXPEN 100 UNIT/ML FlexPen Inject 12 Units into the skin 3 (three) times daily with meals. With every meal and every snack 12/13/15   [provider]  nystatin-triamcinolone (MYCOLOG II) cream Apply 1 application topically 2 (two) times daily.    [provider]  omega-3 acid ethyl esters (LOVAZA) 1 g capsule Take 1 g by mouth daily.    [provider]  omeprazole (PRILOSEC) 20 MG capsule Take 20 mg by mouth daily.    [provider]  ondansetron (ZOFRAN-ODT) 4 MG disintegrating tablet Take 4 mg by mouth every 8 (eight) hours as needed for nausea or vomiting.    [provider]  potassium chloride SA (K-DUR,KLOR-CON) 20 MEQ tablet take 1 tablet by mouth once daily if needed Patient taking differently: take 1 tablet by mouth once daily 02/08/16   Antonieta Iba, MD  prednisoLONE acetate (PRED FORTE) 1 % ophthalmic suspension Place 1 drop into the right eye daily.     [provider]  rosuvastatin (CRESTOR) 40 MG tablet TAKE 1 TABLET(40 MG) BY MOUTH DAILY 04/30/19   Antonieta Iba, MD  solifenacin (VESICARE) 10 MG tablet Take 10 mg by mouth daily.    [provider]  Spacer/Aero-Holding Chambers (AEROCHAMBER PLUS) inhaler Use as instructed 03/23/18   Domenick Gong, MD  tiZANidine (ZANAFLEX) 4 MG capsule Take 1 capsule (4 mg total) by mouth 3 (three) times daily as needed for muscle spasms. 04/14/19   Chinita Pester, FNP    Allergies Penicillins and Erythromycin base    Social History Social History   Tobacco Use  . Smoking status: Former Smoker    Years: 0.00    Types: Pipe    Quit date: 01/2018    Years since quitting: 2.0  . Smokeless tobacco: Never Used  Vaping Use  . Vaping Use: Some days  . Substances: Nicotine, Flavoring  Substance Use Topics  . Alcohol use: Not Currently    Alcohol/week: 0.0 standard drinks    Comment: used to be moderate  . Drug use: No    Review of Systems Patient denies headaches, rhinorrhea, blurry vision, numbness, shortness of breath, chest pain, edema, cough, abdominal pain, nausea, vomiting, diarrhea, dysuria, fevers, rashes or hallucinations unless otherwise stated above in HPI. ____________________________________________   PHYSICAL EXAM:  VITAL SIGNS: Vitals:   01/08/20 1132 01/08/20 1421  BP: 102/66 107/65  Pulse: 81 80  Resp: 18 18  Temp: 97.8  F (36.6 C)   SpO2: 100% 100%    Constitutional: Alert and oriented.   Eyes: Conjunctivae are normal.  Head: Atraumatic. Nose: No congestion/rhinnorhea. Mouth/Throat: Mucous membranes are moist.   Neck: No stridor. Painless ROM.  Cardiovascular: Normal rate, regular rhythm. Grossly normal heart sounds.  Good peripheral circulation. Respiratory: Normal respiratory effort.  No retractions. Lungs CTAB. Gastrointestinal: Soft, multiple abdominal surgical scars.  No erythema or induration.. No distention. No abdominal bruits. No CVA tenderness. Genitourinary:  Musculoskeletal: No lower extremity tenderness nor edema.  No joint effusions. Neurologic:  Normal speech and language. No gross focal neurologic deficits are appreciated. No facial droop Skin:  Skin is warm, dry and intact. No rash noted. Psychiatric: Mood and affect are normal. Speech and behavior are normal.  ____________________________________________   LABS (all labs ordered are listed, but only abnormal results are displayed)  Results for orders placed or performed during the hospital encounter of 01/08/20 (from the past 24 hour(s))  Lipase, blood     Status: None   Collection Time: 01/08/20 11:36 AM  Result Value Ref Range   Lipase 44 11 - 51 U/L  Comprehensive metabolic panel     Status: Abnormal   Collection Time: 01/08/20 11:36 AM  Result Value Ref Range   Sodium 140 135 - 145 mmol/L   Potassium 4.3 3.5 - 5.1 mmol/L   Chloride 101 98 - 111 mmol/L   CO2 27 22 - 32 mmol/L   Glucose, Bld 154 (H) 70 - 99 mg/dL   BUN 26 (H) 6 - 20 mg/dL   Creatinine, Ser 1.611.71 (H) 0.61 - 1.24 mg/dL   Calcium 9.5 8.9 - 09.610.3 mg/dL   Total Protein 7.4 6.5 - 8.1 g/dL   Albumin 4.2 3.5 - 5.0 g/dL   AST 21 15 - 41 U/L   ALT 26 0 - 44 U/L   Alkaline Phosphatase 65 38 - 126 U/L   Total Bilirubin 0.6 0.3 - 1.2 mg/dL   GFR, Estimated 51 (L) >60 mL/min   Anion gap 12 5 - 15  CBC     Status: Abnormal   Collection Time: 01/08/20 11:36 AM  Result Value Ref Range   WBC 5.2 4.0 - 10.5 K/uL   RBC 4.64 4.22 - 5.81  MIL/uL   Hemoglobin 12.9 (L) 13.0 - 17.0 g/dL   HCT 04.540.0 39 - 52 %   MCV 86.2 80.0 - 100.0 fL   MCH 27.8 26.0 - 34.0 pg   MCHC 32.3 30.0 - 36.0 g/dL   RDW 40.914.0 81.111.5 - 91.415.5 %   Platelets 247 150 - 400 K/uL   nRBC 0.0 0.0 - 0.2 %  Urinalysis, Complete w Microscopic Urine, Clean Catch     Status: Abnormal   Collection Time: 01/08/20  3:43 PM  Result Value Ref Range   Color, Urine YELLOW (A) YELLOW   APPearance CLEAR (A) CLEAR   Specific Gravity, Urine 1.031 (H) 1.005 - 1.030   pH 5.0 5.0 - 8.0   Glucose, UA >=500 (A) NEGATIVE mg/dL   Hgb urine dipstick NEGATIVE NEGATIVE   Bilirubin Urine NEGATIVE NEGATIVE   Ketones, ur NEGATIVE NEGATIVE mg/dL   Protein, ur 30 (A) NEGATIVE mg/dL   Nitrite NEGATIVE NEGATIVE   Leukocytes,Ua NEGATIVE NEGATIVE   RBC / HPF 0-5 0 - 5 RBC/hpf   WBC, UA 0-5 0 - 5 WBC/hpf   Bacteria, UA RARE (A) NONE SEEN   Squamous Epithelial / LPF 0-5 0 - 5   ____________________________________________  ____________________________________________  RADIOLOGY  I personally reviewed all radiographic images ordered to evaluate for the above acute complaints and reviewed radiology reports and findings.  These findings were personally discussed with the patient.  Please see medical record for radiology report.  ____________________________________________   PROCEDURES  Procedure(s) performed:  Procedures    Critical Care performed: no ____________________________________________   INITIAL IMPRESSION / ASSESSMENT AND PLAN / ED COURSE  Pertinent labs & imaging results that were available during my care of the patient were reviewed by me and considered in my medical decision making (see chart for details).   DDX: Diverticulitis, colitis, hernia, cystitis, pyelonephritis, SBO  Cory Campbell is a 39 y.o. who presents to the ED with presentation as described above.  He is nontoxic-appearing.  Blood work is reassuring but given his complex past medical history  abdominal pain status post multiple surgeries will order CT imaging to evaluate for acute intra-abdominal process.  Clinical Course as of Jan 07 1646  Thu Jan 08, 2020  1527 Patient reassessed.  Is nontoxic-appearing.  Area of questionable cellulitis is location where he is frequently giving himself insulin injections is not particularly tender on that location.  Have a lower suspicion for cellulitis.  Certainly no evidence of abscess or fluctuance.   [PR]  1555 CO2: 27 [PR]  1641 On exam patient does not have any signs of ulceration or skin breakdown.  Repeat abdominal exam is otherwise soft and benign.  He does appear appropriate for outpatient follow-up.  He is tolerating p.o.  Have discussed with the patient and available family all diagnostics and treatments performed thus far and all questions were answered to the best of my ability. The patient demonstrates understanding and agreement with plan.    [PR]    Clinical Course User Index [PR] Willy Eddy, MD    The patient was evaluated in Emergency Department today for the symptoms described in the history of present illness. He/she was evaluated in the context of the global COVID-19 pandemic, which necessitated consideration that the patient might be at risk for infection with the SARS-CoV-2 virus that causes COVID-19. Institutional protocols and algorithms that pertain to the evaluation of patients at risk for COVID-19 are in a state of rapid change based on information released by regulatory bodies including the CDC and federal and state organizations. These policies and algorithms were followed during the patient's care in the ED.  As part of my medical decision making, I reviewed the following data within the electronic MEDICAL RECORD NUMBER Nursing notes reviewed and incorporated, Labs reviewed, notes from prior ED visits and [place] Controlled Substance Database   ____________________________________________   FINAL CLINICAL  IMPRESSION(S) / ED DIAGNOSES  Final diagnoses:  Generalized abdominal pain      NEW MEDICATIONS STARTED DURING THIS VISIT:  New Prescriptions   DICYCLOMINE (BENTYL) 10 MG CAPSULE    Take 1 capsule (10 mg total) by mouth 3 (three) times daily as needed for up to 14 days for spasms.     Note:  This document was prepared using Dragon voice recognition software and may include unintentional dictation errors.    Willy Eddy, MD 01/08/20 [phone]

## 2020-01-08 NOTE — Discharge Instructions (Signed)

## 2020-03-29 ENCOUNTER — Encounter: Payer: Self-pay | Admitting: Emergency Medicine

## 2020-03-29 ENCOUNTER — Other Ambulatory Visit: Payer: Self-pay

## 2020-03-29 ENCOUNTER — Emergency Department: Payer: Medicaid Other

## 2020-03-29 ENCOUNTER — Inpatient Hospital Stay
Admission: EM | Admit: 2020-03-29 | Discharge: 2020-03-31 | DRG: 202 | Disposition: A | Discharge status: Home / Self Care | Payer: Medicaid Other | Attending: Internal Medicine | Admitting: Internal Medicine

## 2020-03-29 DIAGNOSIS — G8220 Paraplegia, unspecified: Secondary | ICD-10-CM | POA: Diagnosis present

## 2020-03-29 DIAGNOSIS — B20 Human immunodeficiency virus [HIV] disease: Secondary | ICD-10-CM | POA: Diagnosis present

## 2020-03-29 DIAGNOSIS — G4733 Obstructive sleep apnea (adult) (pediatric): Secondary | ICD-10-CM | POA: Diagnosis present

## 2020-03-29 DIAGNOSIS — Z7984 Long term (current) use of oral hypoglycemic drugs: Secondary | ICD-10-CM

## 2020-03-29 DIAGNOSIS — Z6836 Body mass index (BMI) 36.0-36.9, adult: Secondary | ICD-10-CM

## 2020-03-29 DIAGNOSIS — N400 Enlarged prostate without lower urinary tract symptoms: Secondary | ICD-10-CM | POA: Diagnosis present

## 2020-03-29 DIAGNOSIS — E669 Obesity, unspecified: Secondary | ICD-10-CM | POA: Diagnosis present

## 2020-03-29 DIAGNOSIS — R400 Somnolence: Secondary | ICD-10-CM

## 2020-03-29 DIAGNOSIS — J4541 Moderate persistent asthma with (acute) exacerbation: Principal | ICD-10-CM | POA: Diagnosis present

## 2020-03-29 DIAGNOSIS — J45901 Unspecified asthma with (acute) exacerbation: Secondary | ICD-10-CM | POA: Diagnosis present

## 2020-03-29 DIAGNOSIS — Z20822 Contact with and (suspected) exposure to covid-19: Secondary | ICD-10-CM | POA: Diagnosis present

## 2020-03-29 DIAGNOSIS — M1990 Unspecified osteoarthritis, unspecified site: Secondary | ICD-10-CM | POA: Diagnosis present

## 2020-03-29 DIAGNOSIS — I4891 Unspecified atrial fibrillation: Secondary | ICD-10-CM | POA: Diagnosis present

## 2020-03-29 DIAGNOSIS — I959 Hypotension, unspecified: Secondary | ICD-10-CM | POA: Diagnosis present

## 2020-03-29 DIAGNOSIS — G894 Chronic pain syndrome: Secondary | ICD-10-CM | POA: Diagnosis present

## 2020-03-29 DIAGNOSIS — Z79899 Other long term (current) drug therapy: Secondary | ICD-10-CM

## 2020-03-29 DIAGNOSIS — Z794 Long term (current) use of insulin: Secondary | ICD-10-CM

## 2020-03-29 DIAGNOSIS — E1159 Type 2 diabetes mellitus with other circulatory complications: Secondary | ICD-10-CM | POA: Diagnosis present

## 2020-03-29 DIAGNOSIS — G40909 Epilepsy, unspecified, not intractable, without status epilepticus: Secondary | ICD-10-CM | POA: Diagnosis present

## 2020-03-29 DIAGNOSIS — I152 Hypertension secondary to endocrine disorders: Secondary | ICD-10-CM | POA: Diagnosis present

## 2020-03-29 DIAGNOSIS — F32A Depression, unspecified: Secondary | ICD-10-CM | POA: Diagnosis present

## 2020-03-29 DIAGNOSIS — E1169 Type 2 diabetes mellitus with other specified complication: Secondary | ICD-10-CM | POA: Diagnosis present

## 2020-03-29 DIAGNOSIS — K219 Gastro-esophageal reflux disease without esophagitis: Secondary | ICD-10-CM | POA: Diagnosis present

## 2020-03-29 DIAGNOSIS — Z87891 Personal history of nicotine dependence: Secondary | ICD-10-CM

## 2020-03-29 DIAGNOSIS — J9601 Acute respiratory failure with hypoxia: Principal | ICD-10-CM | POA: Diagnosis present

## 2020-03-29 DIAGNOSIS — Z880 Allergy status to penicillin: Secondary | ICD-10-CM

## 2020-03-29 DIAGNOSIS — E785 Hyperlipidemia, unspecified: Secondary | ICD-10-CM | POA: Diagnosis present

## 2020-03-29 DIAGNOSIS — R4182 Altered mental status, unspecified: Secondary | ICD-10-CM | POA: Diagnosis present

## 2020-03-29 DIAGNOSIS — E119 Type 2 diabetes mellitus without complications: Secondary | ICD-10-CM

## 2020-03-29 DIAGNOSIS — Q688 Other specified congenital musculoskeletal deformities: Secondary | ICD-10-CM

## 2020-03-29 DIAGNOSIS — Z881 Allergy status to other antibiotic agents status: Secondary | ICD-10-CM

## 2020-03-29 DIAGNOSIS — J449 Chronic obstructive pulmonary disease, unspecified: Secondary | ICD-10-CM | POA: Diagnosis present

## 2020-03-29 DIAGNOSIS — E1165 Type 2 diabetes mellitus with hyperglycemia: Secondary | ICD-10-CM | POA: Diagnosis present

## 2020-03-29 DIAGNOSIS — Z7951 Long term (current) use of inhaled steroids: Secondary | ICD-10-CM

## 2020-03-29 DIAGNOSIS — Z21 Asymptomatic human immunodeficiency virus [HIV] infection status: Secondary | ICD-10-CM | POA: Diagnosis present

## 2020-03-29 LAB — URINE DRUG SCREEN, QUALITATIVE (ARMC ONLY)
Amphetamines, Ur Screen: NOT DETECTED
Barbiturates, Ur Screen: NOT DETECTED
Benzodiazepine, Ur Scrn: NOT DETECTED
Cannabinoid 50 Ng, Ur ~~LOC~~: POSITIVE — AB
Cocaine Metabolite,Ur ~~LOC~~: NOT DETECTED
MDMA (Ecstasy)Ur Screen: NOT DETECTED
Methadone Scn, Ur: NOT DETECTED
Opiate, Ur Screen: NOT DETECTED
Phencyclidine (PCP) Ur S: NOT DETECTED
Tricyclic, Ur Screen: POSITIVE — AB

## 2020-03-29 LAB — URINALYSIS, COMPLETE (UACMP) WITH MICROSCOPIC
Bilirubin Urine: NEGATIVE
Glucose, UA: >=500 mg/dL — AB
Hgb urine dipstick: NEGATIVE
Ketones, ur: NEGATIVE mg/dL
Leukocytes,Ua: NEGATIVE
Nitrite: NEGATIVE
Protein, ur: NEGATIVE mg/dL
Specific Gravity, Urine: 1.022 (ref 1.005–1.030)
Squamous Epithelial / HPF: NONE SEEN (ref 0–5)
pH: 5 (ref 5.0–8.0)

## 2020-03-29 LAB — CBC WITH DIFFERENTIAL/PLATELET
Abs Immature Granulocytes: 0.05 10*3/uL (ref 0.00–0.07)
Basophils Absolute: 0.1 10*3/uL (ref 0.0–0.1)
Basophils Relative: 1 %
Eosinophils Absolute: 0.3 10*3/uL (ref 0.0–0.5)
Eosinophils Relative: 4 %
HCT: 47.6 % (ref 39.0–52.0)
Hemoglobin: 15.6 g/dL (ref 13.0–17.0)
Immature Granulocytes: 1 %
Lymphocytes Relative: 22 %
Lymphs Abs: 1.9 10*3/uL (ref 0.7–4.0)
MCH: 28.7 pg (ref 26.0–34.0)
MCHC: 32.8 g/dL (ref 30.0–36.0)
MCV: 87.5 fL (ref 80.0–100.0)
Monocytes Absolute: 0.6 10*3/uL (ref 0.1–1.0)
Monocytes Relative: 7 %
Neutro Abs: 5.7 10*3/uL (ref 1.7–7.7)
Neutrophils Relative %: 65 %
Platelets: 272 10*3/uL (ref 150–400)
RBC: 5.44 MIL/uL (ref 4.22–5.81)
RDW: 13.6 % (ref 11.5–15.5)
WBC: 8.7 10*3/uL (ref 4.0–10.5)
nRBC: 0 % (ref 0.0–0.2)

## 2020-03-29 LAB — COMPREHENSIVE METABOLIC PANEL
ALT: 24 U/L (ref 0–44)
AST: 23 U/L (ref 15–41)
Albumin: 4.3 g/dL (ref 3.5–5.0)
Alkaline Phosphatase: 52 U/L (ref 38–126)
Anion gap: 10 (ref 5–15)
BUN: 28 mg/dL — ABNORMAL HIGH (ref 6–20)
CO2: 24 mmol/L (ref 22–32)
Calcium: 9.5 mg/dL (ref 8.9–10.3)
Chloride: 97 mmol/L — ABNORMAL LOW (ref 98–111)
Creatinine, Ser: 1.35 mg/dL — ABNORMAL HIGH (ref 0.61–1.24)
GFR, Estimated: >60 mL/min (ref >60)
Glucose, Bld: 277 mg/dL — ABNORMAL HIGH (ref 70–99)
Potassium: 5.1 mmol/L (ref 3.5–5.1)
Sodium: 131 mmol/L — ABNORMAL LOW (ref 135–145)
Total Bilirubin: 0.7 mg/dL (ref 0.3–1.2)
Total Protein: 7.3 g/dL (ref 6.5–8.1)

## 2020-03-29 LAB — AMMONIA: Ammonia: 19 umol/L (ref 9–35)

## 2020-03-29 LAB — CBG MONITORING, ED
Glucose-Capillary: 239 mg/dL — ABNORMAL HIGH (ref 70–99)
Glucose-Capillary: 245 mg/dL — ABNORMAL HIGH (ref 70–99)

## 2020-03-29 LAB — RESP PANEL BY RT-PCR (FLU A&B, COVID) ARPGX2
Influenza A by PCR: NEGATIVE
Influenza B by PCR: NEGATIVE
SARS Coronavirus 2 by RT PCR: NEGATIVE

## 2020-03-29 LAB — ETHANOL: Alcohol, Ethyl (B): <10 mg/dL (ref <10)

## 2020-03-29 MED ORDER — ACETAMINOPHEN 325 MG PO TABS: 650.0000 mg | ORAL_TABLET | Freq: Four times a day (QID) | ORAL | Status: DC | PRN

## 2020-03-29 MED ORDER — INSULIN ASPART 100 UNIT/ML ~~LOC~~ SOLN
0.0000 [IU] | Freq: Every day | SUBCUTANEOUS | Status: DC
  Administered 2020-03-29: 2 [IU] via SUBCUTANEOUS
  Administered 2020-03-30: 5 [IU] via SUBCUTANEOUS
  Filled 2020-03-29 (×2): qty 1

## 2020-03-29 MED ORDER — LACTATED RINGERS IV BOLUS
1000.0000 mL | Freq: Once | INTRAVENOUS | Status: AC
  Administered 2020-03-29: 1000 mL via INTRAVENOUS

## 2020-03-29 MED ORDER — BICTEGRAVIR-EMTRICITAB-TENOFOV 50-200-25 MG PO TABS
1.0000 | ORAL_TABLET | Freq: Every day | ORAL | Status: DC
  Administered 2020-03-30 – 2020-03-31 (×2): 1 via ORAL
  Filled 2020-03-29 (×2): qty 1

## 2020-03-29 MED ORDER — GUAIFENESIN ER 600 MG PO TB12
600.0000 mg | ORAL_TABLET | Freq: Two times a day (BID) | ORAL | Status: DC
  Administered 2020-03-29 – 2020-03-31 (×4): 600 mg via ORAL
  Filled 2020-03-29 (×4): qty 1

## 2020-03-29 MED ORDER — INSULIN ASPART 100 UNIT/ML ~~LOC~~ SOLN
0.0000 [IU] | Freq: Three times a day (TID) | SUBCUTANEOUS | Status: DC
  Administered 2020-03-30: 5 [IU] via SUBCUTANEOUS
  Administered 2020-03-30: 8 [IU] via SUBCUTANEOUS
  Administered 2020-03-30: 11 [IU] via SUBCUTANEOUS
  Administered 2020-03-31: 3 [IU] via SUBCUTANEOUS
  Administered 2020-03-31: 8 [IU] via SUBCUTANEOUS
  Filled 2020-03-29 (×5): qty 1

## 2020-03-29 MED ORDER — ENOXAPARIN SODIUM 60 MG/0.6ML ~~LOC~~ SOLN
0.5000 mg/kg | SUBCUTANEOUS | Status: DC
  Administered 2020-03-29 – 2020-03-30 (×2): 50 mg via SUBCUTANEOUS
  Filled 2020-03-29 (×2): qty 0.6

## 2020-03-29 MED ORDER — BUPRENORPHINE 15 MCG/HR TD PTWK
1.0000 | MEDICATED_PATCH | TRANSDERMAL | Status: DC
Start: 2020-04-03 — End: 2020-03-30

## 2020-03-29 MED ORDER — BUPROPION HCL ER (XL) 150 MG PO TB24
150.0000 mg | ORAL_TABLET | Freq: Every day | ORAL | Status: DC
Start: 2020-03-30 — End: 2020-03-31
  Administered 2020-03-30 – 2020-03-31 (×2): 150 mg via ORAL
  Filled 2020-03-29 (×3): qty 1

## 2020-03-29 MED ORDER — INSULIN DEGLUDEC 100 UNIT/ML ~~LOC~~ SOPN: 80.0000 [IU] | PEN_INJECTOR | Freq: Every day | SUBCUTANEOUS | Status: DC

## 2020-03-29 MED ORDER — ROSUVASTATIN CALCIUM 10 MG PO TABS
40.0000 mg | ORAL_TABLET | Freq: Every day | ORAL | Status: DC
  Administered 2020-03-30 – 2020-03-31 (×2): 40 mg via ORAL
  Filled 2020-03-29: qty 4
  Filled 2020-03-29: qty 2
  Filled 2020-03-29: qty 4

## 2020-03-29 MED ORDER — METHYLPREDNISOLONE SODIUM SUCC 40 MG IJ SOLR
40.0000 mg | Freq: Two times a day (BID) | INTRAMUSCULAR | Status: DC
  Administered 2020-03-30 – 2020-03-31 (×3): 40 mg via INTRAVENOUS
  Filled 2020-03-29 (×2): qty 1

## 2020-03-29 MED ORDER — ACETAMINOPHEN 650 MG RE SUPP: 650.0000 mg | Freq: Four times a day (QID) | RECTAL | Status: DC | PRN

## 2020-03-29 MED ORDER — ALBUTEROL SULFATE (2.5 MG/3ML) 0.083% IN NEBU: 2.5000 mg | INHALATION_SOLUTION | Freq: Four times a day (QID) | RESPIRATORY_TRACT | Status: DC | PRN

## 2020-03-29 MED ORDER — GLUCERNA SHAKE PO LIQD
237.0000 mL | Freq: Four times a day (QID) | ORAL | Status: DC
  Administered 2020-03-30 – 2020-03-31 (×5): 237 mL via ORAL

## 2020-03-29 MED ORDER — IPRATROPIUM-ALBUTEROL 0.5-2.5 (3) MG/3ML IN SOLN
3.0000 mL | Freq: Once | RESPIRATORY_TRACT | Status: AC
  Administered 2020-03-29: 3 mL via RESPIRATORY_TRACT
  Filled 2020-03-29: qty 3

## 2020-03-29 MED ORDER — FAMOTIDINE 20 MG PO TABS
20.0000 mg | ORAL_TABLET | Freq: Every day | ORAL | Status: DC
  Administered 2020-03-30 – 2020-03-31 (×2): 20 mg via ORAL
  Filled 2020-03-29 (×3): qty 1

## 2020-03-29 MED ORDER — IPRATROPIUM-ALBUTEROL 0.5-2.5 (3) MG/3ML IN SOLN
3.0000 mL | Freq: Three times a day (TID) | RESPIRATORY_TRACT | Status: DC
  Administered 2020-03-29: 3 mL via RESPIRATORY_TRACT
  Filled 2020-03-29 (×2): qty 3

## 2020-03-29 MED ORDER — ONDANSETRON HCL 4 MG/2ML IJ SOLN: 4.0000 mg | Freq: Four times a day (QID) | INTRAMUSCULAR | Status: DC | PRN

## 2020-03-29 MED ORDER — CITALOPRAM HYDROBROMIDE 20 MG PO TABS
20.0000 mg | ORAL_TABLET | Freq: Every day | ORAL | Status: DC
  Administered 2020-03-30 – 2020-03-31 (×2): 20 mg via ORAL
  Filled 2020-03-29 (×3): qty 1

## 2020-03-29 MED ORDER — METHYLPREDNISOLONE SODIUM SUCC 125 MG IJ SOLR
125.0000 mg | Freq: Once | INTRAMUSCULAR | Status: AC
  Administered 2020-03-29: 125 mg via INTRAVENOUS
  Filled 2020-03-29: qty 2

## 2020-03-29 MED ORDER — INSULIN GLARGINE 100 UNIT/ML ~~LOC~~ SOLN
60.0000 [IU] | Freq: Every day | SUBCUTANEOUS | Status: DC
  Administered 2020-03-29 – 2020-03-30 (×2): 60 [IU] via SUBCUTANEOUS
  Filled 2020-03-29 (×3): qty 0.6

## 2020-03-29 MED ORDER — LACTATED RINGERS IV SOLN: INTRAVENOUS | Status: AC

## 2020-03-29 MED ORDER — ONDANSETRON HCL 4 MG PO TABS: 4.0000 mg | ORAL_TABLET | Freq: Four times a day (QID) | ORAL | Status: DC | PRN

## 2020-03-29 NOTE — ED Notes (Signed)
Pt blood pressure is low. Para March, MD notified.

## 2020-03-29 NOTE — Progress Notes (Signed)
PHARMACIST - PHYSICIAN COMMUNICATION  CONCERNING:  Enoxaparin (Lovenox) for DVT Prophylaxis    RECOMMENDATION: Patient was prescribed enoxaparin 40mg  q24 hours for VTE prophylaxis.   Filed Weights   03/29/20 1549  Weight: 99.8 kg (220 lb)    Body mass index is 36.61 kg/m.  Estimated Creatinine Clearance: 79 mL/min (A) (by C-G formula based on SCr of 1.35 mg/dL (H)).   Based on [facility] policy patient is candidate for enoxaparin 0.5mg /kg TBW SQ every 24 hours based on BMI being >30.  DESCRIPTION: Pharmacy has adjusted enoxaparin dose per [facility] policy.  Patient is now receiving enoxaparin 50 mg every 24 hours    [facility] 03/29/2020 8:28 PM

## 2020-03-29 NOTE — ED Provider Notes (Signed)
[facility] Emergency Department Provider Note   ____________________________________________   Event Date/Time   First MD Initiated Contact with Patient 03/29/20 1541     (approximate)  I have reviewed the triage vital signs and the nursing notes.   HISTORY  Chief Complaint Altered Mental Status    HPI Cory Campbell is a 40 y.o. male with a past medical history of paraplegia, chronic pain syndrome, type 2 diabetes, hypertension, and seizure disorder who presents via EMS after wife called for patient having altered mental status.  Wife states that patient has been more somnolent today.  EMS arrived and stated that they found patient to be with oxygen saturations in the 70s and placed on 6 L nasal cannula with good improvement.  Patient does have a history of COPD and received one DuoNeb in route.  Patient is alert and oriented but does need to be aroused to have a conversation.  Patient denies any pain at this time but does endorse mild shortness of breath.  Patient currently denies any vision changes, tinnitus, difficulty speaking, facial droop, sore throat, chest pain, abdominal pain, nausea/vomiting/diarrhea, dysuria, or weakness/numbness/paresthesias in any extremity         Past Medical History:  Diagnosis Date  . Abrasion or friction burn of foot and toe(s), without mention of infection   . Acid reflux   . Adopted   . Allergic rhinitis, cause unspecified   . Anxiety   . Arthritis   . Arthrogryposis   . Asthma   . Bladder wall thickening   . BPH (benign prostatic hyperplasia)   . Chronic pain syndrome   . Depressive disorder, not elsewhere classified   . Diabetes mellitus without complication (HCC)   . Diverticulitis of colon (without mention of hemorrhage)(562.11)   . ED (erectile dysfunction)   . Herpes genitalis   . History of echocardiogram    a. 10/2016: EF 50-55%, normal wall motion  . History of stress test    a. 06/2011:  significant GI uptake artifact, no evidence of ischemia, EF 56%  . HIV infection (HCC)   . HTN (hypertension)   . Hyperlipidemia   . Hypogonadism in male   . Myalgia and myositis, unspecified   . OAB (overactive bladder)   . Obesity   . Other psoriasis   . Premature baby    born premature  . Seizure disorder (HCC)   . Sleep apnea   . 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 or other cause, without mention of thyrotoxic crisis or storm    hyperthyroidism  . TIA (transient ischemic attack)   . Type II or unspecified type diabetes mellitus with neurological manifestations, not stated as uncontrolled(250.60)     Patient Active Problem List   Diagnosis Date Noted  . Acute respiratory failure with hypoxia (HCC) 03/29/2020  . Surgical wound, non healing   . Acute colitis 05/20/2017  . Colitis   . Acute renal failure (HCC) 05/15/2017  . Diarrhea 05/15/2017  . Sepsis (HCC) 05/15/2017  . Abdominal pain 11/25/2016  . Hypotension 10/08/2016  . Unstable angina (HCC) 10/06/2016  . Shortness of breath 06/23/2015  . Obesity 05/18/2014  . Frequent falls 08/12/2013  . Pain of left clavicle 08/12/2013  . Tachycardia 01/21/2013  . Obstructive sleep apnea on CPAP 06/07/2012  . Chest pain 06/20/2011  . Uncontrolled hypertension 06/20/2011  . Type 2 diabetes mellitus with complication, without long-term current use of insulin (HCC) 06/20/2011    Past Surgical History:  Procedure Laterality  Date  . APPENDECTOMY    . COLOSTOMY    . CORNEAL TRANSPLANT    . feet surgery     both  . HAND SURGERY     left and right  . leg surgery     left and right  . ORTHOPEDIC SURGERY     hands, feet, knees, legs  . tubes in ears     both    Prior to Admission medications   Medication Sig Start Date End Date Taking? Authorizing Provider  acyclovir (ZOVIRAX) 400 MG tablet Take 400 mg by mouth 2 (two) times daily.    [provider]  albuterol (PROVENTIL HFA;VENTOLIN HFA) 108 (90 BASE)  MCG/ACT inhaler Inhale 2 puffs into the lungs every 4 (four) hours as needed for wheezing or shortness of breath.     [provider]  albuterol (PROVENTIL) (2.5 MG/3ML) 0.083% nebulizer solution Take 2.5 mg by nebulization every 4 (four) hours as needed for wheezing or shortness of breath.    [provider]  alfuzosin (UROXATRAL) 10 MG 24 hr tablet Take 10 mg by mouth daily.    [provider]  BIKTARVY 50-200-25 MG TABS tablet Take 1 tablet by mouth daily. 11/21/16   [provider]  Buprenorphine (BUTRANS) 15 MCG/HR PTWK Place 15 mg onto the skin once a week. Saturday    [provider]  buPROPion (WELLBUTRIN XL) 150 MG 24 hr tablet Take 1 tablet by mouth daily. 11/16/16   [provider]  citalopram (CELEXA) 20 MG tablet Take 20 mg by mouth daily.    [provider]  diclofenac sodium (VOLTAREN) 1 % GEL Apply 2-4 g topically 4 (four) times daily as needed. For pain.    [provider]  dicyclomine (BENTYL) 10 MG capsule Take 1 capsule (10 mg total) by mouth 3 (three) times daily as needed for up to 14 days for spasms. 01/08/20 01/22/20  Willy Eddy, MD  dicyclomine (BENTYL) 20 MG tablet Take 20 mg by mouth every 6 (six) hours.    [provider]  famotidine (PEPCID) 20 MG tablet Take 20 mg by mouth daily.  01/03/16   [provider]  feeding supplement, GLUCERNA SHAKE, (GLUCERNA SHAKE) LIQD Take 237 mLs by mouth 4 (four) times daily.    [provider]  ferrous sulfate 325 (65 FE) MG tablet Take by mouth.    [provider]  finasteride (PROSCAR) 5 MG tablet Take 5 mg by mouth daily.    [provider]  fluticasone (FLOVENT HFA) 110 MCG/ACT inhaler Inhale 2 puffs into the lungs 2 (two) times daily.    [provider]  gabapentin (NEURONTIN) 300 MG capsule Take 300 mg by mouth at bedtime.    [provider]  hydrocortisone 2.5 % cream Apply 1 application  topically 2 (two) times daily as needed for itching.     [provider]  ibuprofen (ADVIL,MOTRIN) 600 MG tablet Take 1 tablet (600 mg total) by mouth every 6 (six) hours as needed. 05/05/18   Domenick Gong, MD  insulin degludec (TRESIBA FLEXTOUCH) 100 UNIT/ML SOPN FlexTouch Pen Inject 80 Units into the skin at bedtime.     [provider]  ketoconazole (NIZORAL) 2 % shampoo Apply 1 application topically 2 (two) times a week. tues and thursday 08/27/14   [provider]  lisinopril (PRINIVIL,ZESTRIL) 40 MG tablet Take 1 tablet (40 mg total) by mouth daily. 04/23/18 07/22/18  Antonieta Iba, MD  loratadine (CLARITIN) 10 MG  tablet Take 10 mg by mouth daily.    [provider]  magnesium oxide (MAG-OX) 400 (241.3 Mg) MG tablet Take 1 tablet (400 mg total) by mouth daily. 05/23/17   Salary, Jetty Duhamel D, MD  Melatonin 5 MG TABS Take 10 mg by mouth at bedtime as needed (sleep).    [provider]  metFORMIN (GLUCOPHAGE-XR) 750 MG 24 hr tablet Take 750 mg by mouth 2 (two) times daily.    [provider]  metoprolol tartrate (LOPRESSOR) 100 MG tablet TAKE 1 TABLET(100 MG) BY MOUTH TWICE DAILY 11/05/19   Antonieta Iba, MD  miconazole (MICOTIN) 2 % cream Apply 1 application topically 2 (two) times daily.    [provider]  mirtazapine (REMERON) 45 MG tablet Take 45 mg by mouth at bedtime.    [provider]  Multiple Vitamin (MULTIVITAMIN) tablet Take 1 tablet by mouth daily.    [provider]  mupirocin ointment (BACTROBAN) 2 % AAA BID x 5 days, then PRN 07/03/19   Verlee Monte, NP  naproxen (NAPROSYN) 500 MG tablet Take 1 tablet (500 mg total) by mouth 2 (two) times daily. 04/18/18   Candis Schatz, PA-C  nitroGLYCERIN (NITROSTAT) 0.4 MG SL tablet DISSOLVE 1 TABLET UNDER THE TONGUE EVERY 5 MINUTES AS DIRECTED AS NEEDED FOR CHEST PAIN. IF AFTER 3 DOSES, NO RELIEF, CALL 911 07/23/18   Antonieta Iba, MD  nortriptyline  (PAMELOR) 25 MG capsule Take 50 mg by mouth at bedtime.    [provider]  NOVOLOG FLEXPEN 100 UNIT/ML FlexPen Inject 12 Units into the skin 3 (three) times daily with meals. With every meal and every snack 12/13/15   [provider]  nystatin-triamcinolone (MYCOLOG II) cream Apply 1 application topically 2 (two) times daily.    [provider]  omega-3 acid ethyl esters (LOVAZA) 1 g capsule Take 1 g by mouth daily.    [provider]  omeprazole (PRILOSEC) 20 MG capsule Take 20 mg by mouth daily.    [provider]  ondansetron (ZOFRAN-ODT) 4 MG disintegrating tablet Take 4 mg by mouth every 8 (eight) hours as needed for nausea or vomiting.    [provider]  potassium chloride SA (K-DUR,KLOR-CON) 20 MEQ tablet take 1 tablet by mouth once daily if needed Patient taking differently: take 1 tablet by mouth once daily 02/08/16   Antonieta Iba, MD  prednisoLONE acetate (PRED FORTE) 1 % ophthalmic suspension Place 1 drop into the right eye daily.     [provider]  rosuvastatin (CRESTOR) 40 MG tablet TAKE 1 TABLET(40 MG) BY MOUTH DAILY 04/30/19   Antonieta Iba, MD  solifenacin (VESICARE) 10 MG tablet Take 10 mg by mouth daily.    [provider]  Spacer/Aero-Holding Chambers (AEROCHAMBER PLUS) inhaler Use as instructed 03/23/18   Domenick Gong, MD  tiZANidine (ZANAFLEX) 4 MG capsule Take 1 capsule (4 mg total) by mouth 3 (three) times daily as needed for muscle spasms. 04/14/19   Chinita Pester, FNP    Allergies Penicillins and Erythromycin base  Family History  Adopted: Yes  Family history unknown: Yes    Social History Social History   Tobacco Use  . Smoking status: Former Smoker    Years: 0.00    Types: Pipe    Quit date: 01/2018    Years since quitting: 2.2  . Smokeless tobacco: Never Used  Vaping Use  . Vaping Use: Some days  . Substances: Nicotine, Flavoring  Substance Use Topics  . Alcohol use:  Not Currently    Alcohol/week: 0.0 standard drinks    Comment: used to be moderate  . Drug use: No    Review of Systems Constitutional: No fever/chills Eyes: No visual changes. ENT: No sore throat. Cardiovascular: Denies chest pain. Respiratory: Endorses shortness of breath. Gastrointestinal: No abdominal pain.  No nausea, no vomiting.  No diarrhea. Genitourinary: Negative for dysuria. Musculoskeletal: Negative for acute arthralgias Skin: Negative for rash. Neurological: Negative for headaches, weakness/numbness/paresthesias in any extremity Psychiatric: Negative for suicidal ideation/homicidal ideation   ____________________________________________   PHYSICAL EXAM:  VITAL SIGNS: ED Triage Vitals  Enc Vitals Group     BP 03/29/20 1547 123/84     Pulse Rate 03/29/20 1547 (!) 107     Resp 03/29/20 1547 (!) 23     Temp 03/29/20 1547 98.1 F (36.7 C)     Temp Source 03/29/20 1547 Axillary     SpO2 03/29/20 1547 100 %     Weight 03/29/20 1549 220 lb (99.8 kg)     Height 03/29/20 1549 5\' 5"  (1.651 m)     Head Circumference --      Peak Flow --      Pain Score --      Pain Loc --      Pain Edu? --      Excl. in GC? --    Constitutional: Alert and oriented. Well appearing and in no acute distress. Eyes: Conjunctivae are normal. PERRL. Head: Atraumatic. Nose: No congestion/rhinnorhea. Mouth/Throat: Mucous membranes are moist. Neck: No stridor Cardiovascular: Grossly normal heart sounds.  Good peripheral circulation. Respiratory: Normal respiratory effort.  No retractions.  Mild inspiratory and expiratory wheezes appreciated over bilateral lung fields Gastrointestinal: Soft and nontender. No distention. Musculoskeletal: No obvious deformities Neurologic:  Normal speech and language. No gross focal neurologic deficits are appreciated. Skin:  Skin is warm and dry. No rash noted. Psychiatric: Mood and affect are normal. Speech and behavior are  normal.  ____________________________________________   LABS (all labs ordered are listed, but only abnormal results are displayed)  Labs Reviewed  COMPREHENSIVE METABOLIC PANEL - Abnormal; Notable for the following components:      Result Value   Sodium 131 (*)    Chloride 97 (*)    Glucose, Bld 277 (*)    BUN 28 (*)    Creatinine, Ser 1.35 (*)    All other components within normal limits  RESP PANEL BY RT-PCR (FLU A&B, COVID) ARPGX2  CBC WITH DIFFERENTIAL/PLATELET  AMMONIA  ETHANOL  URINALYSIS, COMPLETE (UACMP) WITH MICROSCOPIC  URINE DRUG SCREEN, QUALITATIVE ([facility] ONLY)  URINALYSIS, ROUTINE W REFLEX MICROSCOPIC  CBG MONITORING, ED   ____________________________________________  EKG  ED ECG REPORT I, , the attending physician, personally viewed and interpreted this ECG.  Date: 03/29/2020 EKG Time: 1546 Rate: 106 Rhythm: normal sinus rhythm QRS Axis: normal Intervals: normal ST/T Wave abnormalities: normal Narrative Interpretation: no evidence of acute ischemia  ____________________________________________  RADIOLOGY  ED MD interpretation: One-view portable chest x-ray shows no evidence of acute abnormalities including no pneumonia, pneumothorax, or widened mediastinum  CT of the head without contrast shows no evidence of acute abnormalities including no intracerebral hemorrhage, obvious masses, or significant edema  Official radiology report(s): DG Chest 1 View  Result Date: 03/29/2020 CLINICAL DATA:  Shortness of breath. Sudden change in mental status. Lethargy. EXAM: CHEST  1 VIEW COMPARISON:  Two-view chest x-ray 05/24/2017 FINDINGS: Heart size is exaggerated by low lung volumes.  No edema or effusion is present. No focal airspace disease is present. Gaseous distention of the stomach is noted. IMPRESSION: Low lung volumes without acute cardiopulmonary disease. Electronically Signed   By: Marin Robertshristopher  Mattern M.D.   On: 03/29/2020 16:21   CT HEAD  WO CONTRAST  Result Date: 03/29/2020 CLINICAL DATA:  Altered mental status. EXAM: CT HEAD WITHOUT CONTRAST TECHNIQUE: Contiguous axial images were obtained from the base of the skull through the vertex without intravenous contrast. COMPARISON:  CT head May 03, 2010. FINDINGS: Brain: No evidence of acute large vascular territory infarction, hemorrhage, hydrocephalus, extra-axial collection or mass lesion/mass effect. Mild patchy white matter hypoattenuation, nonspecific but most likely related to chronic microvascular ischemic disease. Vascular: No hyperdense vessel identified. Skull: No acute fracture. Sinuses/Orbits: Visualized sinuses are clear.  Unremarkable orbits. Other: Status post left partial mastoidectomy with nonspecific fluid in the mastoidectomy bowl and opacification of remaining left mastoid air cells. No right mastoid effusion. IMPRESSION: No evidence of acute intracranial abnormality. Electronically Signed   By: Feliberto HartsFrederick S Jones MD   On: 03/29/2020 17:06    ____________________________________________   PROCEDURES  Procedure(s) performed (including Critical Care):  .1-3 Lead EKG Interpretation Performed by: Merwyn KatosBradler, Lore Polka K, MD Authorized by: Merwyn KatosBradler, Shimika Ames K, MD     Interpretation: abnormal     ECG rate:  109   ECG rate assessment: tachycardic     Rhythm: sinus tachycardia     Ectopy: none     Conduction: normal   .Critical Care Performed by: Merwyn KatosBradler, Jamariya Davidoff K, MD Authorized by: Merwyn KatosBradler, Jashan Cotten K, MD   Critical care provider statement:    Critical care time (minutes):  35   Critical care time was exclusive of:  Separately billable procedures and treating other patients   Critical care was necessary to treat or prevent imminent or life-threatening deterioration of the following conditions:  Respiratory failure   Critical care was time spent personally by me on the following activities:  Discussions with consultants, evaluation of patient's response to treatment, examination  of patient, ordering and performing treatments and interventions, ordering and review of laboratory studies, ordering and review of radiographic studies, pulse oximetry, re-evaluation of patient's condition, obtaining history from patient or surrogate and review of old charts   I assumed direction of critical care for this patient from another provider in my specialty: no     Care discussed with: admitting provider       ____________________________________________   INITIAL IMPRESSION / ASSESSMENT AND PLAN / ED COURSE  As part of my medical decision making, I reviewed the following data within the electronic MEDICAL RECORD NUMBER Nursing notes reviewed and incorporated, Labs reviewed, EKG interpreted, Old chart reviewed, Radiograph reviewed and Notes from prior ED visits reviewed and incorporated        Presentation most consistent with acute asthma exacerbation.  Presentation less concerning for pneumonia, heart failure, foreign body airway obstruction, pulmonary embolism, tamponade, atypical ACS  Reassuring factors: No AMS, silent respirations, belly-breathing, or other sign of impending ventilatory failure. Never intubated or admitted to the hospital for asthma exacerbation.  Workup Defer labs and imaging given clinically in exacerbation of known asthma with similar exacerbation presentations per patient.  Reasessment: Patient STILL without AMS, silent respirations, belly-breathing, or other sign of impending ventilatory failure. HOWEVER, they are only with minimal improvement of symptoms with steroids and breathing treatments over several hours. Disposition: OBS admission for continued treatment and airway monitoring.      ____________________________________________   FINAL CLINICAL IMPRESSION(S) /  ED DIAGNOSES  Final diagnoses:  Acute respiratory failure with hypoxia (HCC)  Moderate persistent asthma with exacerbation  Somnolence     ED Discharge Orders    None        Note:  This document was prepared using Dragon voice recognition software and may include unintentional dictation errors.   [name], MD 03/29/20 [phone]

## 2020-03-29 NOTE — H&P (Signed)
History and Physical    Cory Campbell UJW:[id number] DOB: 12/13/1980 DOA: 03/29/2020  PCP: Emogene Morgan, MD  Patient coming from: Home via EMS  I have personally briefly reviewed patient's old medical records in [facility]  Chief Complaint: Altered mental status  HPI: Cory Campbell is a 40 y.o. male with medical history significant for HIV, IDT2DM, HTN, asthma, HLD, chronic pain syndrome, arthrogryposis with resultant osteoarthritis, contractures, and paraplegia, multiple abdominal surgeries (s/p ileostomy takedown May 2019), seizure disorder, BPH, depression, OSA on CPAP who presents to the ED for evaluation of altered mental status and shortness of breath.  Patient reports feeling unwell for about 2 weeks.  He was more somnolent earlier today which was an apparent significant change from his baseline.  EMS were called and on their arrival he was found to have O2 saturations in the 70s while on room air.  He was placed on 6 L O2 via nasal cannula with improvement.  He was given a DuoNeb treatment on route to the ED.  At time of admission, patient is alert and oriented.  He does report feeling short of breath recently.  He denies any chest pain, cough, subjective fevers, chills, diaphoresis, nausea, vomiting, dysuria.  He says he uses a motorized wheelchair for transport.  ED Course:  Initial vitals showed BP 123/84, pulse 107, RR 23, temp 98.1 F, SPO2 100% on 6 L O2 via [place].  Labs show sodium 131, potassium 5.1, bicarb 24, BUN 28, creatinine 1.35, serum glucose 277, LFTs within normal limits, WBC 8.7, hemoglobin 15.6, platelets 272,000, ammonia 19, serum ethanol <10.  SARS-CoV-2 PCR is negative.  Influenza A/B PCR's are negative.  Portable chest x-ray shows low lung volumes without focal consolidation, edema, or effusion.  CT head without contrast is negative for acute intracranial abnormality.  Patient was given IV Solu-Medrol 125 mg and DuoNeb treatment.  The  hospitalist service was consulted to admit for further evaluation and management.  Review of Systems: All systems reviewed and are negative except as documented in history of present illness above.   Past Medical History:  Diagnosis Date  . Abrasion or friction burn of foot and toe(s), without mention of infection   . Acid reflux   . Adopted   . Allergic rhinitis, cause unspecified   . Anxiety   . Arthritis   . Arthrogryposis   . Asthma   . Bladder wall thickening   . BPH (benign prostatic hyperplasia)   . Chronic pain syndrome   . Depressive disorder, not elsewhere classified   . Diabetes mellitus without complication (HCC)   . Diverticulitis of colon (without mention of hemorrhage)(562.11)   . ED (erectile dysfunction)   . Herpes genitalis   . History of echocardiogram    a. 10/2016: EF 50-55%, normal wall motion  . History of stress test    a. 06/2011: significant GI uptake artifact, no evidence of ischemia, EF 56%  . HIV infection (HCC)   . HTN (hypertension)   . Hyperlipidemia   . Hypogonadism in male   . Myalgia and myositis, unspecified   . OAB (overactive bladder)   . Obesity   . Other psoriasis   . Premature baby    born premature  . Seizure disorder (HCC)   . Sleep apnea   . 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 or other cause, without mention of thyrotoxic crisis or storm    hyperthyroidism  . TIA (transient ischemic attack)   . Type II or  unspecified type diabetes mellitus with neurological manifestations, not stated as uncontrolled(250.60)     Past Surgical History:  Procedure Laterality Date  . APPENDECTOMY    . COLOSTOMY    . CORNEAL TRANSPLANT    . feet surgery     both  . HAND SURGERY     left and right  . leg surgery     left and right  . ORTHOPEDIC SURGERY     hands, feet, knees, legs  . tubes in ears     both    Social History:  reports that he quit smoking about 2 years ago. His smoking use included pipe. He quit after 0.00 years of  use. He has never used smokeless tobacco. He reports previous alcohol use. He reports that he does not use drugs.  Allergies  Allergen Reactions  . Penicillins Hives, Itching and Rash    Has patient had a PCN reaction causing immediate rash, facial/tongue/throat swelling, SOB or lightheadedness with hypotension: Yes Has patient had a PCN reaction causing severe rash involving mucus membranes or skin necrosis: No Has patient had a PCN reaction that required hospitalization No Has patient had a PCN reaction occurring within the last 10 years: No If all of the above answers are "NO", then may proceed with Cephalosporin use.   . Erythromycin Base Itching and Rash    Family History  Adopted: Yes  Family history unknown: Yes     Prior to Admission medications   Medication Sig Start Date End Date Taking? Authorizing Provider  acyclovir (ZOVIRAX) 400 MG tablet Take 400 mg by mouth 2 (two) times daily.    [provider]  albuterol (PROVENTIL HFA;VENTOLIN HFA) 108 (90 BASE) MCG/ACT inhaler Inhale 2 puffs into the lungs every 4 (four) hours as needed for wheezing or shortness of breath.     [provider]  albuterol (PROVENTIL) (2.5 MG/3ML) 0.083% nebulizer solution Take 2.5 mg by nebulization every 4 (four) hours as needed for wheezing or shortness of breath.    [provider]  alfuzosin (UROXATRAL) 10 MG 24 hr tablet Take 10 mg by mouth daily.    [provider]  BIKTARVY 50-200-25 MG TABS tablet Take 1 tablet by mouth daily. 11/21/16   [provider]  Buprenorphine (BUTRANS) 15 MCG/HR PTWK Place 15 mg onto the skin once a week. Saturday    [provider]  buPROPion (WELLBUTRIN XL) 150 MG 24 hr tablet Take 1 tablet by mouth daily. 11/16/16   [provider]  citalopram (CELEXA) 20 MG tablet Take 20 mg by mouth daily.    [provider]  diclofenac sodium (VOLTAREN) 1 % GEL Apply 2-4 g topically 4 (four) times daily as  needed. For pain.    [provider]  dicyclomine (BENTYL) 10 MG capsule Take 1 capsule (10 mg total) by mouth 3 (three) times daily as needed for up to 14 days for spasms. 01/08/20 01/22/20  Willy Eddyobinson, Patrick, MD  dicyclomine (BENTYL) 20 MG tablet Take 20 mg by mouth every 6 (six) hours.    [provider]  famotidine (PEPCID) 20 MG tablet Take 20 mg by mouth daily.  01/03/16   [provider]  feeding supplement, GLUCERNA SHAKE, (GLUCERNA SHAKE) LIQD Take 237 mLs by mouth 4 (four) times daily.    [provider]  ferrous sulfate 325 (65 FE) MG tablet Take by mouth.    [provider]  finasteride (PROSCAR) 5 MG tablet Take 5 mg by mouth daily.  [provider]  fluticasone (FLOVENT HFA) 110 MCG/ACT inhaler Inhale 2 puffs into the lungs 2 (two) times daily.    [provider]  gabapentin (NEURONTIN) 300 MG capsule Take 300 mg by mouth at bedtime.    [provider]  hydrocortisone 2.5 % cream Apply 1 application topically 2 (two) times daily as needed for itching.     [provider]  ibuprofen (ADVIL,MOTRIN) 600 MG tablet Take 1 tablet (600 mg total) by mouth every 6 (six) hours as needed. 05/05/18   Domenick Gong, MD  insulin degludec (TRESIBA FLEXTOUCH) 100 UNIT/ML SOPN FlexTouch Pen Inject 80 Units into the skin at bedtime.     [provider]  ketoconazole (NIZORAL) 2 % shampoo Apply 1 application topically 2 (two) times a week. tues and thursday 08/27/14   [provider]  lisinopril (PRINIVIL,ZESTRIL) 40 MG tablet Take 1 tablet (40 mg total) by mouth daily. 04/23/18 07/22/18  Antonieta Iba, MD  loratadine (CLARITIN) 10 MG tablet Take 10 mg by mouth daily.    [provider]  magnesium oxide (MAG-OX) 400 (241.3 Mg) MG tablet Take 1 tablet (400 mg total) by mouth daily. 05/23/17   Salary, Jetty Duhamel D, MD  Melatonin 5 MG TABS Take 10 mg by mouth at bedtime as needed (sleep).    [provider]  metFORMIN (GLUCOPHAGE-XR) 750 MG 24 hr tablet Take 750 mg by mouth 2 (two) times daily.    [provider]  metoprolol tartrate (LOPRESSOR) 100 MG tablet TAKE 1 TABLET(100 MG) BY MOUTH TWICE DAILY 11/05/19   Antonieta Iba, MD  miconazole (MICOTIN) 2 % cream Apply 1 application topically 2 (two) times daily.    [provider]  mirtazapine (REMERON) 45 MG tablet Take 45 mg by mouth at bedtime.    [provider]  Multiple Vitamin (MULTIVITAMIN) tablet Take 1 tablet by mouth daily.    [provider]  mupirocin ointment (BACTROBAN) 2 % AAA BID x 5 days, then PRN 07/03/19   Verlee Monte, NP  naproxen (NAPROSYN) 500 MG tablet Take 1 tablet (500 mg total) by mouth 2 (two) times daily. 04/18/18   Candis Schatz, PA-C  nitroGLYCERIN (NITROSTAT) 0.4 MG SL tablet DISSOLVE 1 TABLET UNDER THE TONGUE EVERY 5 MINUTES AS DIRECTED AS NEEDED FOR CHEST PAIN. IF AFTER 3 DOSES, NO RELIEF, CALL 911 07/23/18   Antonieta Iba, MD  nortriptyline (PAMELOR) 25 MG capsule Take 50 mg by mouth at bedtime.    [provider]  NOVOLOG FLEXPEN 100 UNIT/ML FlexPen Inject 12 Units into the skin 3 (three) times daily with meals. With every meal and every snack 12/13/15   [provider]  nystatin-triamcinolone (MYCOLOG II) cream Apply 1 application topically 2 (two) times daily.    [provider]  omega-3 acid ethyl esters (LOVAZA) 1 g capsule Take 1 g by mouth daily.    [provider]  omeprazole (PRILOSEC) 20 MG capsule Take 20 mg by mouth daily.    [provider]  ondansetron (ZOFRAN-ODT) 4 MG disintegrating tablet Take 4 mg by mouth every 8 (eight) hours as needed for nausea or vomiting.    [provider]  potassium chloride SA (K-DUR,KLOR-CON) 20 MEQ tablet take 1 tablet by mouth once daily if needed Patient taking differently: take 1 tablet by mouth once daily 02/08/16   Antonieta Iba, MD  prednisoLONE acetate  (PRED FORTE) 1 % ophthalmic suspension Place 1 drop into the right  eye daily.     [provider]  rosuvastatin (CRESTOR) 40 MG tablet TAKE 1 TABLET(40 MG) BY MOUTH DAILY 04/30/19   Antonieta Iba, MD  solifenacin (VESICARE) 10 MG tablet Take 10 mg by mouth daily.    [provider]  Spacer/Aero-Holding Chambers (AEROCHAMBER PLUS) inhaler Use as instructed 03/23/18   Domenick Gong, MD  tiZANidine (ZANAFLEX) 4 MG capsule Take 1 capsule (4 mg total) by mouth 3 (three) times daily as needed for muscle spasms. 04/14/19   Chinita Pester, FNP    Physical Exam: Vitals:   03/29/20 1710 03/29/20 1800 03/29/20 2230 03/29/20 2249  BP: 104/76 97/71 (!) 88/66 (!) 86/60  Pulse: (!) 107 (!) 110 (!) 117 (!) 117  Resp: Temp:    (!) 97.5 F (36.4 C)  TempSrc:    Oral  SpO2: 100% 98% 95% 96%  Weight:      Height:       Constitutional: Resting in bed in the right lateral decubitus position, NAD, calm, comfortable Eyes: PERRL, lids and conjunctivae normal ENMT: Mucous membranes are dry. Posterior pharynx clear of any exudate or lesions.poor dentition.  Neck: normal, supple, no masses. Respiratory: Faint end expiratory wheezing. Normal respiratory effort. No accessory muscle use.  Cardiovascular: Tachycardic, no murmurs / rubs / gallops. No extremity edema. 2+ pedal pulses. Abdomen: no tenderness, no masses palpated. No hepatosplenomegaly. Bowel sounds positive.  Musculoskeletal: no clubbing / cyanosis.  Thin cachectic lower extremities with muscle wasting.  Paraplegia.  Range of motion of upper extremities intact. Skin: no rashes, lesions, ulcers. No induration Neurologic: CN 2-12 grossly intact. Sensation intact. Strength 5/5 in both upper extremities.  Paraplegic. Psychiatric: Normal judgment and insight. Alert and oriented x 3. Normal mood.    Labs on Admission: I have personally reviewed following labs and imaging studies  CBC: Recent Labs  Lab 03/29/20 1553   WBC 8.7  NEUTROABS 5.7  HGB 15.6  HCT 47.6  MCV 87.5  PLT 272   Basic Metabolic Panel: Recent Labs  Lab 03/29/20 1553  NA 131*  K 5.1  CL 97*  CO2 24  GLUCOSE 277*  BUN 28*  CREATININE 1.35*  CALCIUM 9.5   GFR: Estimated Creatinine Clearance: 79 mL/min (A) (by C-G formula based on SCr of 1.35 mg/dL (H)). Liver Function Tests: Recent Labs  Lab 03/29/20 1553  AST 23  ALT 24  ALKPHOS 52  BILITOT 0.7  PROT 7.3  ALBUMIN 4.3   No results for input(s): LIPASE, AMYLASE in the last 168 hours. Recent Labs  Lab 03/29/20 1553  AMMONIA 19   Coagulation Profile: No results for input(s): INR, PROTIME in the last 168 hours. Cardiac Enzymes: No results for input(s): CKTOTAL, CKMB, CKMBINDEX, TROPONINI in the last 168 hours. BNP (last 3 results) No results for input(s): PROBNP in the last 8760 hours. HbA1C: No results for input(s): HGBA1C in the last 72 hours. CBG: Recent Labs  Lab 03/29/20 1634  GLUCAP 239*   Lipid Profile: No results for input(s): CHOL, HDL, LDLCALC, TRIG, CHOLHDL, LDLDIRECT in the last 72 hours. Thyroid Function Tests: No results for input(s): TSH, T4TOTAL, FREET4, T3FREE, THYROIDAB in the last 72 hours. Anemia Panel: No results for input(s): VITAMINB12, FOLATE, FERRITIN, TIBC, IRON, RETICCTPCT in the last 72 hours. Urine analysis:    Component Value Date/Time   COLORURINE YELLOW (A) 01/08/2020 1543   APPEARANCEUR CLEAR (A) 01/08/2020 1543   APPEARANCEUR Cloudy 05/16/2014 0350   LABSPEC 1.031 (H) 01/08/2020  1543   LABSPEC 1.018 05/16/2014 0350   PHURINE 5.0 01/08/2020 1543   GLUCOSEU >=500 (A) 01/08/2020 1543   GLUCOSEU Negative 05/16/2014 0350   HGBUR NEGATIVE 01/08/2020 1543   BILIRUBINUR NEGATIVE 01/08/2020 1543   BILIRUBINUR Negative 05/16/2014 0350   KETONESUR NEGATIVE 01/08/2020 1543   PROTEINUR 30 (A) 01/08/2020 1543   NITRITE NEGATIVE 01/08/2020 1543   LEUKOCYTESUR NEGATIVE 01/08/2020 1543   LEUKOCYTESUR 1+ 05/16/2014 0350     Radiological Exams on Admission: DG Chest 1 View  Result Date: 03/29/2020 CLINICAL DATA:  Shortness of breath. Sudden change in mental status. Lethargy. EXAM: CHEST  1 VIEW COMPARISON:  Two-view chest x-ray 05/24/2017 FINDINGS: Heart size is exaggerated by low lung volumes. No edema or effusion is present. No focal airspace disease is present. Gaseous distention of the stomach is noted. IMPRESSION: Low lung volumes without acute cardiopulmonary disease. Electronically Signed   By: Marin Roberts M.D.   On: 03/29/2020 16:21   CT HEAD WO CONTRAST  Result Date: 03/29/2020 CLINICAL DATA:  Altered mental status. EXAM: CT HEAD WITHOUT CONTRAST TECHNIQUE: Contiguous axial images were obtained from the base of the skull through the vertex without intravenous contrast. COMPARISON:  CT head May 03, 2010. FINDINGS: Brain: No evidence of acute large vascular territory infarction, hemorrhage, hydrocephalus, extra-axial collection or mass lesion/mass effect. Mild patchy white matter hypoattenuation, nonspecific but most likely related to chronic microvascular ischemic disease. Vascular: No hyperdense vessel identified. Skull: No acute fracture. Sinuses/Orbits: Visualized sinuses are clear.  Unremarkable orbits. Other: Status post left partial mastoidectomy with nonspecific fluid in the mastoidectomy bowl and opacification of remaining left mastoid air cells. No right mastoid effusion. IMPRESSION: No evidence of acute intracranial abnormality. Electronically Signed   By: Feliberto Harts MD   On: 03/29/2020 17:06    EKG: Personally reviewed. Sinus tachycardia, rate 106, no acute ischemic changes.  Rate is faster when compared to prior.  Assessment/Plan Principal Problem:   Acute respiratory failure with hypoxia (HCC) Active Problems:   Hypertension associated with diabetes (HCC)   Asthma, chronic, unspecified asthma severity, with acute exacerbation   Chronic pain syndrome   Hyperlipidemia  associated with type 2 diabetes mellitus (HCC)   HIV (human immunodeficiency virus infection) (HCC)   Diabetes mellitus (HCC)  Cory Campbell is a 40 y.o. male with medical history significant for HIV, IDT2DM, HTN, asthma, HLD, chronic pain syndrome, arthrogryposis with resultant osteoarthritis, contractures, and paraplegia, multiple abdominal surgeries (s/p ileostomy takedown May 2019), seizure disorder, BPH, depression, OSA on CPAP who is admitted with acute respiratory failure with hypoxia due to asthma exacerbation.  Acute respiratory failure with hypoxia due to asthma exacerbation: Patient found to be hypoxic with SPO2 in the 70s on room air by EMS.  Initially required 6 L O2 via [place] with improvement in O2 saturations.  He was wheezing on admission.  Started on steroids and nebulizer treatments with improving symptoms. -Continue IV Solu-Medrol 40 mg twice daily -Schedule duo nebs with as needed albuterol nebulizers -Supplemental O2 as needed  Hypotension with history of hypertension: Holding home meds with hypotension.  Give 1 L LR bolus now followed by maintenance fluids.  Can give additional fluid boluses if needed.  Insulin-dependent type 2 diabetes with hyperglycemia: Resume home Tresiba 80 units nightly plus sliding scale insulin.  Hold home Metformin for now.  HIV: Continue Biktarvy.  Hyperlipidemia: Continue rosuvastatin.  Chronic pain syndrome/arthrogryposis and osteoarthritis/contractures with paraplegia: Follows with pain management.  Continue buprenorphine patch.  Depression: Continue Celexa, Wellbutrin.  OSA: Continue CPAP nightly.   DVT prophylaxis: Lovenox Code Status: Full code, confirmed with patient Family Communication: Discussed with patient Disposition Plan: From home and likely discharge to home Consults called: None Level of care: Med-Surg Admission status:  Status is: Observation  The patient remains OBS appropriate and will d/c before 2  midnights.  Dispo: The patient is from: Home              Anticipated d/c is to: Home              Anticipated d/c date is: 1 day              Patient currently is not medically stable to d/c.   Difficult to place patient No  Darreld Mclean MD [facility]  If 7PM-7AM, please contact night-coverage [email or web address]  03/29/2020, 11:22 PM

## 2020-03-29 NOTE — ED Triage Notes (Signed)
Pt to ED via [facility]. Per EMS, pt was doing fine earlier today with his wife. Wife stated that pt had a sudden change in mental status and pt became really lethargic. Pt has had dark urine as well. EMS vitals; BP137/87, CBG 220, O2 95% on 6L.Pt has hx of COPD and is paraplegic  Pt is alert and oriented, but has to be aroused to have a conversation. Pt denies pain at this time.

## 2020-03-30 ENCOUNTER — Encounter: Payer: Self-pay | Admitting: Internal Medicine

## 2020-03-30 DIAGNOSIS — B20 Human immunodeficiency virus [HIV] disease: Secondary | ICD-10-CM | POA: Diagnosis present

## 2020-03-30 DIAGNOSIS — J449 Chronic obstructive pulmonary disease, unspecified: Secondary | ICD-10-CM | POA: Diagnosis present

## 2020-03-30 DIAGNOSIS — I152 Hypertension secondary to endocrine disorders: Secondary | ICD-10-CM

## 2020-03-30 DIAGNOSIS — M1990 Unspecified osteoarthritis, unspecified site: Secondary | ICD-10-CM | POA: Diagnosis present

## 2020-03-30 DIAGNOSIS — K219 Gastro-esophageal reflux disease without esophagitis: Secondary | ICD-10-CM | POA: Diagnosis present

## 2020-03-30 DIAGNOSIS — G894 Chronic pain syndrome: Secondary | ICD-10-CM | POA: Diagnosis present

## 2020-03-30 DIAGNOSIS — G8220 Paraplegia, unspecified: Secondary | ICD-10-CM | POA: Diagnosis present

## 2020-03-30 DIAGNOSIS — Q688 Other specified congenital musculoskeletal deformities: Secondary | ICD-10-CM | POA: Diagnosis not present

## 2020-03-30 DIAGNOSIS — E669 Obesity, unspecified: Secondary | ICD-10-CM | POA: Diagnosis present

## 2020-03-30 DIAGNOSIS — E1159 Type 2 diabetes mellitus with other circulatory complications: Secondary | ICD-10-CM

## 2020-03-30 DIAGNOSIS — Z881 Allergy status to other antibiotic agents status: Secondary | ICD-10-CM | POA: Diagnosis not present

## 2020-03-30 DIAGNOSIS — E1169 Type 2 diabetes mellitus with other specified complication: Secondary | ICD-10-CM | POA: Diagnosis present

## 2020-03-30 DIAGNOSIS — J45901 Unspecified asthma with (acute) exacerbation: Secondary | ICD-10-CM | POA: Diagnosis not present

## 2020-03-30 DIAGNOSIS — Z6836 Body mass index (BMI) 36.0-36.9, adult: Secondary | ICD-10-CM | POA: Diagnosis not present

## 2020-03-30 DIAGNOSIS — I959 Hypotension, unspecified: Secondary | ICD-10-CM | POA: Diagnosis present

## 2020-03-30 DIAGNOSIS — N400 Enlarged prostate without lower urinary tract symptoms: Secondary | ICD-10-CM | POA: Diagnosis present

## 2020-03-30 DIAGNOSIS — J9601 Acute respiratory failure with hypoxia: Secondary | ICD-10-CM | POA: Diagnosis present

## 2020-03-30 DIAGNOSIS — F32A Depression, unspecified: Secondary | ICD-10-CM | POA: Diagnosis present

## 2020-03-30 DIAGNOSIS — G40909 Epilepsy, unspecified, not intractable, without status epilepticus: Secondary | ICD-10-CM | POA: Diagnosis present

## 2020-03-30 DIAGNOSIS — G4733 Obstructive sleep apnea (adult) (pediatric): Secondary | ICD-10-CM | POA: Diagnosis present

## 2020-03-30 DIAGNOSIS — E785 Hyperlipidemia, unspecified: Secondary | ICD-10-CM | POA: Diagnosis present

## 2020-03-30 DIAGNOSIS — I4891 Unspecified atrial fibrillation: Secondary | ICD-10-CM | POA: Diagnosis present

## 2020-03-30 DIAGNOSIS — E1165 Type 2 diabetes mellitus with hyperglycemia: Secondary | ICD-10-CM | POA: Diagnosis present

## 2020-03-30 DIAGNOSIS — Z880 Allergy status to penicillin: Secondary | ICD-10-CM | POA: Diagnosis not present

## 2020-03-30 DIAGNOSIS — J4541 Moderate persistent asthma with (acute) exacerbation: Secondary | ICD-10-CM | POA: Diagnosis present

## 2020-03-30 DIAGNOSIS — Z20822 Contact with and (suspected) exposure to covid-19: Secondary | ICD-10-CM | POA: Diagnosis present

## 2020-03-30 LAB — GLUCOSE, CAPILLARY
Glucose-Capillary: 204 mg/dL — ABNORMAL HIGH (ref 70–99)
Glucose-Capillary: 326 mg/dL — ABNORMAL HIGH (ref 70–99)
Glucose-Capillary: 258 mg/dL — ABNORMAL HIGH (ref 70–99)
Glucose-Capillary: 403 mg/dL — ABNORMAL HIGH (ref 70–99)

## 2020-03-30 LAB — BASIC METABOLIC PANEL
Anion gap: 9 (ref 5–15)
BUN: 28 mg/dL — ABNORMAL HIGH (ref 6–20)
CO2: 24 mmol/L (ref 22–32)
Calcium: 9 mg/dL (ref 8.9–10.3)
Chloride: 103 mmol/L (ref 98–111)
Creatinine, Ser: 1.18 mg/dL (ref 0.61–1.24)
GFR, Estimated: >60 mL/min (ref >60)
Glucose, Bld: 211 mg/dL — ABNORMAL HIGH (ref 70–99)
Potassium: 4.3 mmol/L (ref 3.5–5.1)
Sodium: 136 mmol/L (ref 135–145)

## 2020-03-30 LAB — CBC
HCT: 39.1 % (ref 39.0–52.0)
Hemoglobin: 13.3 g/dL (ref 13.0–17.0)
MCH: 29.4 pg (ref 26.0–34.0)
MCHC: 34 g/dL (ref 30.0–36.0)
MCV: 86.3 fL (ref 80.0–100.0)
Platelets: 248 10*3/uL (ref 150–400)
RBC: 4.53 MIL/uL (ref 4.22–5.81)
RDW: 13.6 % (ref 11.5–15.5)
WBC: 11.6 10*3/uL — ABNORMAL HIGH (ref 4.0–10.5)
nRBC: 0 % (ref 0.0–0.2)

## 2020-03-30 LAB — D-DIMER, QUANTITATIVE: D-Dimer, Quant: <0.27 ug/mL-FEU (ref 0.00–0.50)

## 2020-03-30 LAB — HEMOGLOBIN A1C
Hgb A1c MFr Bld: 7.1 % — ABNORMAL HIGH (ref 4.8–5.6)
Mean Plasma Glucose: 157.07 mg/dL

## 2020-03-30 MED ORDER — DARIFENACIN HYDROBROMIDE ER 7.5 MG PO TB24
15.0000 mg | ORAL_TABLET | Freq: Every day | ORAL | Status: DC
  Administered 2020-03-30 – 2020-03-31 (×2): 15 mg via ORAL
  Filled 2020-03-30 (×2): qty 2
  Filled 2020-03-30: qty 1

## 2020-03-30 MED ORDER — MAGNESIUM OXIDE 400 (241.3 MG) MG PO TABS
400.0000 mg | ORAL_TABLET | Freq: Every day | ORAL | Status: DC
Start: 2020-03-30 — End: 2020-03-31
  Administered 2020-03-30 – 2020-03-31 (×2): 400 mg via ORAL
  Filled 2020-03-30 (×2): qty 1

## 2020-03-30 MED ORDER — MELATONIN 5 MG PO TABS
10.0000 mg | ORAL_TABLET | Freq: Every evening | ORAL | Status: DC | PRN
Start: 2020-03-30 — End: 2020-03-31

## 2020-03-30 MED ORDER — INSULIN ASPART 100 UNIT/ML ~~LOC~~ SOLN
12.0000 [IU] | Freq: Three times a day (TID) | SUBCUTANEOUS | Status: DC
  Administered 2020-03-30 – 2020-03-31 (×3): 12 [IU] via SUBCUTANEOUS
  Filled 2020-03-30 (×3): qty 1

## 2020-03-30 MED ORDER — LORATADINE 10 MG PO TABS
10.0000 mg | ORAL_TABLET | Freq: Every day | ORAL | Status: DC
  Administered 2020-03-30 – 2020-03-31 (×2): 10 mg via ORAL
  Filled 2020-03-30 (×2): qty 1

## 2020-03-30 MED ORDER — BUPRENORPHINE 7.5 MCG/HR TD PTWK
1.0000 | MEDICATED_PATCH | TRANSDERMAL | Status: DC
Start: 2020-04-03 — End: 2020-03-31

## 2020-03-30 MED ORDER — ALFUZOSIN HCL ER 10 MG PO TB24
10.0000 mg | ORAL_TABLET | Freq: Every day | ORAL | Status: DC
  Administered 2020-03-31: 10 mg via ORAL
  Filled 2020-03-30: qty 1

## 2020-03-30 MED ORDER — PANTOPRAZOLE SODIUM 40 MG PO TBEC
40.0000 mg | DELAYED_RELEASE_TABLET | Freq: Every day | ORAL | Status: DC
  Administered 2020-03-30 – 2020-03-31 (×2): 40 mg via ORAL
  Filled 2020-03-30 (×2): qty 1

## 2020-03-30 MED ORDER — ACYCLOVIR 200 MG PO CAPS
400.0000 mg | ORAL_CAPSULE | Freq: Two times a day (BID) | ORAL | Status: DC
  Administered 2020-03-30 – 2020-03-31 (×2): 400 mg via ORAL
  Filled 2020-03-30 (×3): qty 2

## 2020-03-30 MED ORDER — ALBUTEROL SULFATE (2.5 MG/3ML) 0.083% IN NEBU: 2.5000 mg | INHALATION_SOLUTION | RESPIRATORY_TRACT | Status: DC | PRN

## 2020-03-30 MED ORDER — FINASTERIDE 5 MG PO TABS
5.0000 mg | ORAL_TABLET | Freq: Every day | ORAL | Status: DC
Start: 2020-03-31 — End: 2020-03-31
  Administered 2020-03-31: 5 mg via ORAL
  Filled 2020-03-30: qty 1

## 2020-03-30 MED ORDER — POLYETHYLENE GLYCOL 3350 17 G PO PACK: 17.0000 g | PACK | Freq: Three times a day (TID) | ORAL | Status: DC | PRN

## 2020-03-30 MED ORDER — GABAPENTIN 300 MG PO CAPS
300.0000 mg | ORAL_CAPSULE | Freq: Every day | ORAL | Status: DC
  Administered 2020-03-30: 300 mg via ORAL
  Filled 2020-03-30: qty 1

## 2020-03-30 MED ORDER — NORTRIPTYLINE HCL 25 MG PO CAPS
50.0000 mg | ORAL_CAPSULE | Freq: Every day | ORAL | Status: DC
  Administered 2020-03-30: 50 mg via ORAL
  Filled 2020-03-30 (×2): qty 2

## 2020-03-30 MED ORDER — PREDNISOLONE ACETATE 1 % OP SUSP
1.0000 [drp] | Freq: Every day | OPHTHALMIC | Status: DC
  Administered 2020-03-30 – 2020-03-31 (×2): 1 [drp] via OPHTHALMIC
  Filled 2020-03-30: qty 1

## 2020-03-30 MED ORDER — LACTATED RINGERS IV BOLUS
2000.0000 mL | Freq: Once | INTRAVENOUS | Status: AC
  Administered 2020-03-30: 2000 mL via INTRAVENOUS

## 2020-03-30 MED ORDER — DICYCLOMINE HCL 10 MG PO CAPS
10.0000 mg | ORAL_CAPSULE | Freq: Three times a day (TID) | ORAL | Status: DC | PRN
  Administered 2020-03-30: 10 mg via ORAL
  Filled 2020-03-30 (×3): qty 1

## 2020-03-30 MED ORDER — ALBUTEROL SULFATE HFA 108 (90 BASE) MCG/ACT IN AERS
2.0000 | INHALATION_SPRAY | RESPIRATORY_TRACT | Status: DC | PRN
  Filled 2020-03-30: qty 6.7

## 2020-03-30 MED ORDER — POTASSIUM CHLORIDE CRYS ER 20 MEQ PO TBCR
20.0000 meq | EXTENDED_RELEASE_TABLET | Freq: Every day | ORAL | Status: DC
  Administered 2020-03-30 – 2020-03-31 (×2): 20 meq via ORAL
  Filled 2020-03-30 (×2): qty 1

## 2020-03-30 MED ORDER — ACYCLOVIR 400 MG PO TABS
400.0000 mg | ORAL_TABLET | Freq: Two times a day (BID) | ORAL | Status: DC
  Filled 2020-03-30: qty 1

## 2020-03-30 MED ORDER — MIRTAZAPINE 15 MG PO TABS
45.0000 mg | ORAL_TABLET | Freq: Every day | ORAL | Status: DC
  Administered 2020-03-30: 45 mg via ORAL
  Filled 2020-03-30: qty 3

## 2020-03-30 MED ORDER — INSULIN ASPART 100 UNIT/ML FLEXPEN: 12.0000 [IU] | PEN_INJECTOR | Freq: Three times a day (TID) | SUBCUTANEOUS | Status: DC

## 2020-03-30 MED ORDER — METOPROLOL TARTRATE 50 MG PO TABS
100.0000 mg | ORAL_TABLET | Freq: Two times a day (BID) | ORAL | Status: DC
  Administered 2020-03-30 – 2020-03-31 (×2): 100 mg via ORAL
  Filled 2020-03-30 (×2): qty 2

## 2020-03-30 MED ORDER — IBUPROFEN 400 MG PO TABS: 600.0000 mg | ORAL_TABLET | Freq: Four times a day (QID) | ORAL | Status: DC | PRN

## 2020-03-30 MED ORDER — TIZANIDINE HCL 4 MG PO TABS
4.0000 mg | ORAL_TABLET | Freq: Three times a day (TID) | ORAL | Status: DC | PRN
  Administered 2020-03-30: 4 mg via ORAL
  Filled 2020-03-30 (×3): qty 1

## 2020-03-30 MED ORDER — IPRATROPIUM-ALBUTEROL 0.5-2.5 (3) MG/3ML IN SOLN
3.0000 mL | Freq: Three times a day (TID) | RESPIRATORY_TRACT | Status: DC
  Administered 2020-03-30 – 2020-03-31 (×3): 3 mL via RESPIRATORY_TRACT
  Filled 2020-03-30 (×3): qty 3

## 2020-03-30 MED ORDER — ONDANSETRON 4 MG PO TBDP: 4.0000 mg | ORAL_TABLET | Freq: Three times a day (TID) | ORAL | Status: DC | PRN

## 2020-03-30 NOTE — Progress Notes (Signed)
Progress Note    Cory Campbell  JXB:[id number] DOB: 1980/08/07  DOA: 03/29/2020 PCP: Emogene MorganAycock, Ngwe A, MD      Brief Narrative:    Medical records reviewed and are as summarized below:  Cory Campbell is a 40 y.o. male with medical history significant for  atrial fibrillation, HIV, IDT2DM, HTN, asthma, HLD, chronic pain syndrome, arthrogryposis with resultant osteoarthritis, contractures, and paraplegia, multiple abdominal surgeries (s/p ileostomy takedown May 2019), seizure disorder, BPH, depression, OSA on CPAP.  He was brought to the emergency room because of altered mental status (somnolence) and shortness of breath.  Reportedly when EMS picked him up, he was hypoxic with oxygen saturation in the 70s.  He was placed on 6 L/min oxygen and brought to the emergency room.  He was admitted to the hospital for acute hypoxic respiratory failure secondary to acute exacerbation of asthma.  He was treated with steroids, oxygen therapy and bronchodilators.  He was also hypotensive so he was given IV fluids.    Assessment/Plan:   Principal Problem:   Acute respiratory failure with hypoxia (HCC) Active Problems:   Hypertension associated with diabetes (HCC)   Asthma, chronic, unspecified asthma severity, with acute exacerbation   Chronic pain syndrome   Hyperlipidemia associated with type 2 diabetes mellitus (HCC)   HIV (human immunodeficiency virus infection) (HCC)   Diabetes mellitus (HCC)   Acute asthma exacerbation   Body mass index is 36.61 kg/m.  (Obesity)   Acute respiratory failure with hypoxia due to asthma exacerbation: Hypoxia has improved.  He is tolerating room air. Continue IV steroids and bronchodilators  Hypotension with history of hypertension: BP has improved.  Continue to monitor BP closely.  Atrial fibrillation with RVR:  Resume home metoprolol.  Insulin-dependent type 2 diabetes with hyperglycemia: Continue Lantus and NovoLog. Hold home  Metformin for now.   HIV: Continue Biktarvy.  Hyperlipidemia: Continue rosuvastatin.  Chronic pain syndrome/arthrogryposis and osteoarthritis/contractures with paraplegia: Follows with pain management.  Continue buprenorphine patch.  Depression: Continue Celexa, Wellbutrin.  OSA: Continue CPAP nightly.     Diet Order            Diet heart healthy/carb modified Room service appropriate? Yes; Fluid consistency: Thin  Diet effective now                    Consultants:  None  Procedures:  None    Medications:   . acyclovir  400 mg Oral BID  . [START ON 03/31/2020] alfuzosin  10 mg Oral Daily  . bictegravir-emtricitabine-tenofovir AF  1 tablet Oral Daily  . [START ON 04/03/2020] buprenorphine  1 patch Transdermal Weekly  . buPROPion  150 mg Oral Daily  . citalopram  20 mg Oral Daily  . darifenacin  15 mg Oral Daily  . enoxaparin (LOVENOX) injection  0.5 mg/kg Subcutaneous Q24H  . famotidine  20 mg Oral Daily  . feeding supplement (GLUCERNA SHAKE)  237 mL Oral QID  . finasteride  5 mg Oral Daily  . gabapentin  300 mg Oral QHS  . guaiFENesin  600 mg Oral BID  . insulin aspart  12 Units Subcutaneous TID WC  . insulin aspart  0-15 Units Subcutaneous TID WC  . insulin aspart  0-5 Units Subcutaneous QHS  . insulin glargine  60 Units Subcutaneous QHS  . ipratropium-albuterol  3 mL Nebulization TID  . loratadine  10 mg Oral Daily  . magnesium oxide  400 mg Oral Daily  . methylPREDNISolone (SOLU-MEDROL)  injection  40 mg Intravenous Q12H  . metoprolol tartrate  100 mg Oral BID  . mirtazapine  45 mg Oral QHS  . nortriptyline  50 mg Oral QHS  . pantoprazole  40 mg Oral Daily  . potassium chloride SA  20 mEq Oral Daily  . rosuvastatin  40 mg Oral Daily   Continuous Infusions:   Anti-infectives (From admission, onward)   Start     Dose/Rate Route Frequency Ordered Stop   03/30/20 2200  acyclovir (ZOVIRAX) tablet 400 mg  Status:  Discontinued        400  mg Oral 2 times daily 03/30/20 1500 03/30/20 1508   03/30/20 2200  acyclovir (ZOVIRAX) 200 MG capsule 400 mg        400 mg Oral 2 times daily 03/30/20 1508     03/30/20 1000  bictegravir-emtricitabine-tenofovir AF (BIKTARVY) 50-200-25 MG per tablet 1 tablet        1 tablet Oral Daily 03/29/20 2321               Family Communication/Anticipated D/C date and plan/Code Status   DVT prophylaxis:      Code Status: Full Code  Family Communication: None Disposition Plan:    Status is: Inpatient  Remains inpatient appropriate because:IV treatments appropriate due to intensity of illness or inability to take PO   Dispo: The patient is from: Home              Anticipated d/c is to: Home              Anticipated d/c date is: 1 day              Patient currently is not medically stable to d/c.   Difficult to place patient No              Subjective:   Interval events noted.  His breathing is better.  He is not sure what happened to him.  Objective:    Vitals:   03/30/20 0902 03/30/20 1216 03/30/20 1313 03/30/20 1443  BP: 123/83 131/90  (!) 116/92  Pulse: (!) 109 (!) 118  (!) 131  Resp: 18 18  18   Temp: 98.2 F (36.8 C) 98.4 F (36.9 C)  98 F (36.7 C)  TempSrc: Oral Oral  Oral  SpO2: 100% 100% 98% 98%  Weight:      Height:       No data found.   Intake/Output Summary (Last 24 hours) at 03/30/2020 1518 Last data filed at 03/30/2020 1145 Gross per 24 hour  Intake 4134.62 ml  Output 1850 ml  Net 2284.62 ml   Filed Weights   03/29/20 1549  Weight: 99.8 kg    Exam:  GEN: NAD SKIN: Warm and dry EYES: No pallor or icterus ENT: MMM CV: RRR PULM: CTA B ABD: soft, ND, NT, +BS CNS: AAO x 3,paraplegic EXT: No edema or tenderness        Data Reviewed:   I have personally reviewed following labs and imaging studies:  Labs: Labs show the following:   Basic Metabolic Panel: Recent Labs  Lab 03/29/20 1553 03/30/20 0538  NA 131* 136   K 5.1 4.3  CL 97* 103  CO2 24 24  GLUCOSE 277* 211*  BUN 28* 28*  CREATININE 1.35* 1.18  CALCIUM 9.5 9.0   GFR Estimated Creatinine Clearance: 90.4 mL/min (by C-G formula based on SCr of 1.18 mg/dL). Liver Function Tests: Recent Labs  Lab 03/29/20 1553  AST 23  ALT 24  ALKPHOS 52  BILITOT 0.7  PROT 7.3  ALBUMIN 4.3   No results for input(s): LIPASE, AMYLASE in the last 168 hours. Recent Labs  Lab 03/29/20 1553  AMMONIA 19   Coagulation profile No results for input(s): INR, PROTIME in the last 168 hours.  CBC: Recent Labs  Lab 03/29/20 1553 03/30/20 0538  WBC 8.7 11.6*  NEUTROABS 5.7  --   HGB 15.6 13.3  HCT 47.6 39.1  MCV 87.5 86.3  PLT 272 248   Cardiac Enzymes: No results for input(s): CKTOTAL, CKMB, CKMBINDEX, TROPONINI in the last 168 hours. BNP (last 3 results) No results for input(s): PROBNP in the last 8760 hours. CBG: Recent Labs  Lab 03/29/20 1634 03/29/20 2254 03/30/20 0845 03/30/20 1142  GLUCAP 239* 245* 204* 326*   D-Dimer: Recent Labs    03/29/20 2350  DDIMER <0.27   Hgb A1c: Recent Labs    03/30/20 0538  HGBA1C 7.1*   Lipid Profile: No results for input(s): CHOL, HDL, LDLCALC, TRIG, CHOLHDL, LDLDIRECT in the last 72 hours. Thyroid function studies: No results for input(s): TSH, T4TOTAL, T3FREE, THYROIDAB in the last 72 hours.  Invalid input(s): FREET3 Anemia work up: No results for input(s): VITAMINB12, FOLATE, FERRITIN, TIBC, IRON, RETICCTPCT in the last 72 hours. Sepsis Labs: Recent Labs  Lab 03/29/20 1553 03/30/20 0538  WBC 8.7 11.6*    Microbiology Recent Results (from the past 240 hour(s))  Resp Panel by RT-PCR (Flu A&B, Covid) Nasopharyngeal Swab     Status: None   Collection Time: 03/29/20  4:31 PM   Specimen: Nasopharyngeal Swab; Nasopharyngeal(NP) swabs in vial transport medium  Result Value Ref Range Status   SARS Coronavirus 2 by RT PCR NEGATIVE NEGATIVE Final    Comment: (NOTE) SARS-CoV-2 target  nucleic acids are NOT DETECTED.  The SARS-CoV-2 RNA is generally detectable in upper respiratory specimens during the acute phase of infection. The lowest concentration of SARS-CoV-2 viral copies this assay can detect is 138 copies/mL. A negative result does not preclude SARS-Cov-2 infection and should not be used as the sole basis for treatment or other patient management decisions. A negative result may occur with  improper specimen collection/handling, submission of specimen other than nasopharyngeal swab, presence of viral mutation(s) within the areas targeted by this assay, and inadequate number of viral copies(<138 copies/mL). A negative result must be combined with clinical observations, patient history, and epidemiological information. The expected result is Negative.  Fact Sheet for Patients:  BloggerCourse.com  Fact Sheet for Healthcare Providers:  SeriousBroker.it  This test is no t yet approved or cleared by the [place] FDA and  has been authorized for detection and/or diagnosis of SARS-CoV-2 by FDA under an Emergency Use Authorization (EUA). This EUA will remain  in effect (meaning this test can be used) for the duration of the COVID-19 declaration under Section 564(b)(1) of the Act, 21 U.S.C.section 360bbb-3(b)(1), unless the authorization is terminated  or revoked sooner.       Influenza A by PCR NEGATIVE NEGATIVE Final   Influenza B by PCR NEGATIVE NEGATIVE Final    Comment: (NOTE) The Xpert Xpress SARS-CoV-2/FLU/RSV plus assay is intended as an aid in the diagnosis of influenza from Nasopharyngeal swab specimens and should not be used as a sole basis for treatment. Nasal washings and aspirates are unacceptable for Xpert Xpress SARS-CoV-2/FLU/RSV testing.  Fact Sheet for Patients: BloggerCourse.com  Fact Sheet for Healthcare  Providers: SeriousBroker.it  This test is not yet approved or cleared by  the [employer] and has been authorized for detection and/or diagnosis of SARS-CoV-2 by FDA under an Emergency Use Authorization (EUA). This EUA will remain in effect (meaning this test can be used) for the duration of the COVID-19 declaration under Section 564(b)(1) of the Act, 21 U.S.C. section 360bbb-3(b)(1), unless the authorization is terminated or revoked.  Performed at [facility], [address] [place], [place] [address]   CULTURE, BLOOD (ROUTINE X 2) w Reflex to ID Panel     Status: None (Preliminary result)   Collection Time: 03/30/20  1:02 AM   Specimen: BLOOD  Result Value Ref Range Status   Specimen Description BLOOD RIGHT ANTECUBITAL  Final   Special Requests   Final    BOTTLES DRAWN AEROBIC AND ANAEROBIC Blood Culture adequate volume   Culture   Final    NO GROWTH < 12 HOURS Performed at [facility], [address], [place], [place] [address]    Report Status PENDING  Incomplete  CULTURE, BLOOD (ROUTINE X 2) w Reflex to ID Panel     Status: None (Preliminary result)   Collection Time: 03/30/20  1:02 AM   Specimen: BLOOD  Result Value Ref Range Status   Specimen Description BLOOD BLOOD RIGHT HAND  Final   Special Requests   Final    BOTTLES DRAWN AEROBIC AND ANAEROBIC Blood Culture adequate volume   Culture   Final    NO GROWTH < 12 HOURS Performed at [facility], [address], [place], [place] [address]    Report Status PENDING  Incomplete    Procedures and diagnostic studies:  DG Chest 1 View  Result Date: 03/29/2020 CLINICAL DATA:  Shortness of breath. Sudden change in mental status. Lethargy. EXAM: CHEST  1 VIEW COMPARISON:  Two-view chest x-ray 05/24/2017 FINDINGS: Heart size is exaggerated by low lung volumes. No edema or effusion is present. No focal airspace disease is present. Gaseous distention of the stomach  is noted. IMPRESSION: Low lung volumes without acute cardiopulmonary disease. Electronically Signed   By: Marin Roberts M.D.   On: 03/29/2020 16:21   CT HEAD WO CONTRAST  Result Date: 03/29/2020 CLINICAL DATA:  Altered mental status. EXAM: CT HEAD WITHOUT CONTRAST TECHNIQUE: Contiguous axial images were obtained from the base of the skull through the vertex without intravenous contrast. COMPARISON:  CT head May 03, 2010. FINDINGS: Brain: No evidence of acute large vascular territory infarction, hemorrhage, hydrocephalus, extra-axial collection or mass lesion/mass effect. Mild patchy white matter hypoattenuation, nonspecific but most likely related to chronic microvascular ischemic disease. Vascular: No hyperdense vessel identified. Skull: No acute fracture. Sinuses/Orbits: Visualized sinuses are clear.  Unremarkable orbits. Other: Status post left partial mastoidectomy with nonspecific fluid in the mastoidectomy bowl and opacification of remaining left mastoid air cells. No right mastoid effusion. IMPRESSION: No evidence of acute intracranial abnormality. Electronically Signed   By: Feliberto Harts MD   On: 03/29/2020 17:06               LOS: 0 days   BERNARD AYIKU  [facility]   Pager on [email or web address]. If 7PM-7AM, please contact night-coverage at [email or web address]     03/30/2020, 3:18 PM

## 2020-03-30 NOTE — Progress Notes (Signed)
Patient here via stretcher. Patient alert and able to answer questions appropriately. Follows direction. Slid from stretcher to bed x 3. Currently has bolus running of LR. This is the first liter of 2 bags to be given. B/p trend q 15 min. Patient says that he feels very tired and sleepy. Uses urinal at this time. Belongings at bedside. Call light within reach. Braces removed from both legs. Patient says that he removes them while he is sleeping and only uses them when he is up.   0210- 2nd Liter bolus infusing at this time. Continue to observe blood pressure trend q 15 mins

## 2020-03-30 NOTE — Progress Notes (Signed)
   03/30/20 1216  Assess: MEWS Score  Temp 98.4 F (36.9 C)  BP 131/90  Pulse Rate (!) 118  Resp 18  SpO2 100 %  O2 Device Room Air  Assess: MEWS Score  MEWS Temp 0  MEWS Systolic 0  MEWS Pulse 2  MEWS RR 0  MEWS LOC 0  MEWS Score 2  MEWS Score Color Yellow  Assess: if the MEWS score is Yellow or Red  Were vital signs taken at a resting state? Yes  Focused Assessment Change from prior assessment (see assessment flowsheet)  Early Detection of Sepsis Score *See Row Information* Low  MEWS guidelines implemented *See Row Information* Yes  Treat  MEWS Interventions Escalated (See documentation below) (notified MD)  Take Vital Signs  Increase Vital Sign Frequency  Yellow: Q 2hr X 2 then Q 4hr X 2, if remains yellow, continue Q 4hrs  Escalate  MEWS: Escalate Yellow: discuss with charge nurse/RN and consider discussing with provider and RRT  Notify: Charge Nurse/RN  Name of Charge Nurse/RN Notified [employer]  Date Charge Nurse/RN Notified 03/30/20  Time Charge Nurse/RN Notified 1446  Notify: Provider  Provider Name/Title Dr. Myriam Forehand  Date Provider Notified 03/30/20  Time Provider Notified 1447  Notification Type Page  Notification Reason Change in status  Provider response No new orders  Document  Patient Outcome Other (Comment) (conitnue to monitor, Pt has hx of afib, metoprol being held due to pt being hypotensive at admission. received a couple of boluses last night.)  Progress note created (see row info) Yes

## 2020-03-31 DIAGNOSIS — B20 Human immunodeficiency virus [HIV] disease: Secondary | ICD-10-CM | POA: Diagnosis not present

## 2020-03-31 DIAGNOSIS — J45901 Unspecified asthma with (acute) exacerbation: Secondary | ICD-10-CM | POA: Diagnosis not present

## 2020-03-31 DIAGNOSIS — J9601 Acute respiratory failure with hypoxia: Secondary | ICD-10-CM | POA: Diagnosis not present

## 2020-03-31 LAB — GLUCOSE, CAPILLARY
Glucose-Capillary: 197 mg/dL — ABNORMAL HIGH (ref 70–99)
Glucose-Capillary: 256 mg/dL — ABNORMAL HIGH (ref 70–99)

## 2020-03-31 MED ORDER — PREDNISONE 50 MG PO TABS: 50.0000 mg | ORAL_TABLET | Freq: Every day | ORAL | Status: DC

## 2020-03-31 MED ORDER — PREDNISONE 10 MG PO TABS: ORAL_TABLET | ORAL | 0 refills | Status: DC

## 2020-03-31 NOTE — Progress Notes (Signed)
Pt has been discharge home. Discharge instructions given to pt.

## 2020-03-31 NOTE — Discharge Instructions (Signed)
Use your inhalers and nebs as before Check your sugars and take your diabetes meds accordingly Check your BP and keep log of it for PCP to review. HOLD BP meds if BP <120

## 2020-03-31 NOTE — Discharge Summary (Signed)
[facility] [place] [facility]   PATIENT NAME: Cory Campbell    MR#:  [id number]  DATE OF BIRTH:  May 24, 1980  DATE OF ADMISSION:  03/29/2020 ADMITTING PHYSICIAN: Lurene Shadow, MD  DATE OF DISCHARGE: 03/31/2020  PRIMARY CARE PHYSICIAN: Emogene Morgan, MD    ADMISSION DIAGNOSIS:  Somnolence [R40.0] Moderate persistent asthma with exacerbation [J45.41] Acute respiratory failure with hypoxia (HCC) [J96.01] Acute asthma exacerbation [J45.901]  DISCHARGE DIAGNOSIS:  Acute respiratory failure with hypoxia-- resolved Asthma exacerbation, mild-moderate, intermittent SECONDARY DIAGNOSIS:   Past Medical History:  Diagnosis Date  . Abrasion or friction burn of foot and toe(s), without mention of infection   . Acid reflux   . Adopted   . Allergic rhinitis, cause unspecified   . Anxiety   . Arthritis   . Arthrogryposis   . Asthma   . Bladder wall thickening   . BPH (benign prostatic hyperplasia)   . Chronic pain syndrome   . Depressive disorder, not elsewhere classified   . Diabetes mellitus without complication (HCC)   . Diverticulitis of colon (without mention of hemorrhage)(562.11)   . ED (erectile dysfunction)   . Herpes genitalis   . History of echocardiogram    a. 10/2016: EF 50-55%, normal wall motion  . History of stress test    a. 06/2011: significant GI uptake artifact, no evidence of ischemia, EF 56%  . HIV infection (HCC)   . HTN (hypertension)   . Hyperlipidemia   . Hypogonadism in male   . Myalgia and myositis, unspecified   . OAB (overactive bladder)   . Obesity   . Other psoriasis   . Premature baby    born premature  . Seizure disorder (HCC)   . Sleep apnea   . 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 or other cause, without mention of thyrotoxic crisis or storm    hyperthyroidism  . TIA (transient ischemic attack)   . Type II or unspecified type diabetes mellitus with neurological manifestations, not stated as uncontrolled(250.60)      HOSPITAL COURSE:  Cory Campbell is a 40 y.o. male with medical history significant for atrial fibrillation, HIV, IDT2DM, HTN, asthma, HLD, chronic pain syndrome,arthrogryposis with resultant osteoarthritis, contractures, andparaplegia, multiple abdominal surgeries (s/p ileostomy takedown May 2019), seizure disorder, BPH, depression, OSA on CPAP.  He was brought to the emergency room because of altered mental status (somnolence) and shortness of breath.  Reportedly when EMS picked him up, he was hypoxic with oxygen saturation in the 70s.  He was placed on 6 L/min oxygen and brought to the emergency room.  Acute respiratory failure with hypoxia due to asthma exacerbation: Hypoxia has improved.  He is tolerating room air.  IV steroids--change to oral steroids and bronchodilators  Hypotension with history of hypertension: BP has improved. -I have resumed his BP meds since BP was 147/111 earlier and now much improved -- patient advised to keep log of BP at home and have told him to hold his BP meds of systolic less than 120  Atrial fibrillation with RVR:  Resume home metoprolol.  Insulin-dependent type 2 diabeteswith hyperglycemia: Continue Lantus and NovoLog. -- Home medicine for diabetes have been resume. He is also advised to keep a log of his sugars and adjusters insulin along with his oral meds according to sugar checks at home follow-up with PCP for the same since he is on multiple meds  HIV: Continue Biktarvy.  Hyperlipidemia: Continue rosuvastatin.  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 Continuebuprenorphine patch.  Depression: Continue Celexa, Wellbutrin.  OSA: Continue CPAP nightly.  Currently hemodynamically stable. Sats are hundred percent on room air. No respiratory distress. Patient is overall stable for discharge. He is back to his baseline. He has multiple chronic  medical issues and is at a high risk for readmission. Patient is in agreement with discharging home  CONSULTS OBTAINED:    DRUG ALLERGIES:   Allergies  Allergen Reactions  . Penicillins Hives, Itching and Rash    Has patient had a PCN reaction causing immediate rash, facial/tongue/throat swelling, SOB or lightheadedness with hypotension: Yes Has patient had a PCN reaction causing severe rash involving mucus membranes or skin necrosis: No Has patient had a PCN reaction that required hospitalization No Has patient had a PCN reaction occurring within the last 10 years: No If all of the above answers are "NO", then may proceed with Cephalosporin use.   . Erythromycin Base Itching and Rash    DISCHARGE MEDICATIONS:   Allergies as of 03/31/2020      Reactions   Penicillins Hives, Itching, Rash   Has patient had a PCN reaction causing immediate rash, facial/tongue/throat swelling, SOB or lightheadedness with hypotension: Yes Has patient had a PCN reaction causing severe rash involving mucus membranes or skin necrosis: No Has patient had a PCN reaction that required hospitalization No Has patient had a PCN reaction occurring within the last 10 years: No If all of the above answers are "NO", then may proceed with Cephalosporin use.   Erythromycin Base Itching, Rash      Medication List    STOP taking these medications   lisinopril 40 MG tablet Commonly known as: ZESTRIL     TAKE these medications   acyclovir 400 MG tablet Commonly known as: ZOVIRAX Take 400 mg by mouth 2 (two) times daily.   AeroChamber Plus inhaler Use as instructed   albuterol (2.5 MG/3ML) 0.083% nebulizer solution Commonly known as: PROVENTIL Take 2.5 mg by nebulization every 4 (four) hours as needed for wheezing or shortness of breath.   albuterol 108 (90 Base) MCG/ACT inhaler Commonly known as: VENTOLIN HFA Inhale 2 puffs into the lungs every 4 (four) hours as needed for wheezing or shortness of  breath.   alfuzosin 10 MG 24 hr tablet Commonly known as: UROXATRAL Take 10 mg by mouth daily.   amLODipine 10 MG tablet Commonly known as: NORVASC Take 10 mg by mouth daily.   aspirin EC 81 MG tablet Take 81 mg by mouth daily. Swallow whole.   Biktarvy 50-200-25 MG Tabs tablet Generic drug: bictegravir-emtricitabine-tenofovir AF Take 1 tablet by mouth daily.   buprenorphine 7.5 MCG/HR Commonly known as: BUTRANS Place 7.5 mg onto the skin once a week. Saturday   buPROPion 150 MG 24 hr tablet Commonly known as: WELLBUTRIN XL Take 1 tablet by mouth daily.   cetirizine 10 MG tablet Commonly known as: ZYRTEC Take 10 mg by mouth daily.   chlorthalidone 25 MG tablet Commonly known as: HYGROTON Take 25 mg by mouth daily.   citalopram 20 MG tablet Commonly known as: CELEXA Take 20 mg by mouth daily.   cloNIDine 0.2 mg/24hr patch Commonly known as: CATAPRES - Dosed in mg/24 hr Place 0.2 mg onto the skin once a week.   diclofenac sodium 1 % Gel Commonly known as: VOLTAREN Apply 2-4 g topically 4 (four) times daily as needed. For pain.   famotidine 20 MG tablet Commonly known as: PEPCID Take 20 mg by mouth daily.  feeding supplement (GLUCERNA SHAKE) Liqd Take 237 mLs by mouth 4 (four) times daily.   ferrous sulfate 325 (65 FE) MG tablet Take 325 mg by mouth 3 (three) times daily.   finasteride 5 MG tablet Commonly known as: PROSCAR Take 5 mg by mouth daily.   fluticasone 110 MCG/ACT inhaler Commonly known as: FLOVENT HFA Inhale 2 puffs into the lungs 2 (two) times daily.   fluticasone 50 MCG/ACT nasal spray Commonly known as: FLONASE Place 2 sprays into both nostrils daily.   gabapentin 300 MG capsule Commonly known as: NEURONTIN Take 300 mg by mouth at bedtime.   glucose 4 GM chewable tablet Chew 1 tablet by mouth as needed for low blood sugar.   ibuprofen 600 MG tablet Commonly known as: ADVIL Take 1 tablet (600 mg total) by mouth every 6 (six)  hours as needed.   insulin degludec 100 UNIT/ML FlexTouch Pen Commonly known as: TRESIBA Inject 100 Units into the skin 2 (two) times daily.   Jardiance 10 MG Tabs tablet Generic drug: empagliflozin Take 10 mg by mouth daily.   ketoconazole 2 % shampoo Commonly known as: NIZORAL Apply 1 application topically 2 (two) times a week. tues and thursday   magnesium oxide 400 (241.3 Mg) MG tablet Commonly known as: MAG-OX Take 1 tablet (400 mg total) by mouth daily.   melatonin 5 MG Tabs Take 10 mg by mouth at bedtime as needed (sleep).   metFORMIN 500 MG 24 hr tablet Commonly known as: GLUCOPHAGE-XR Take 1,000 mg by mouth 2 (two) times daily.   metoprolol tartrate 100 MG tablet Commonly known as: LOPRESSOR TAKE 1 TABLET(100 MG) BY MOUTH TWICE DAILY   mirtazapine 45 MG tablet Commonly known as: REMERON Take 45 mg by mouth at bedtime.   multivitamin tablet Take 1 tablet by mouth daily.   nortriptyline 25 MG capsule Commonly known as: PAMELOR Take 50 mg by mouth at bedtime.   NovoLOG FlexPen 100 UNIT/ML FlexPen Generic drug: insulin aspart Inject 40 Units into the skin 3 (three) times daily with meals. With every meal and every snack   nystatin-triamcinolone cream Commonly known as: MYCOLOG II Apply 1 application topically 2 (two) times daily.   omega-3 acid ethyl esters 1 g capsule Commonly known as: LOVAZA Take 1 g by mouth 3 (three) times daily.   omeprazole 20 MG capsule Commonly known as: PRILOSEC Take 20 mg by mouth daily.   ondansetron 4 MG disintegrating tablet Commonly known as: ZOFRAN-ODT Take 4 mg by mouth every 8 (eight) hours as needed for nausea or vomiting.   polyethylene glycol 17 g packet Commonly known as: MIRALAX / GLYCOLAX Take 17 g by mouth 3 (three) times daily.   potassium chloride SA 20 MEQ tablet Commonly known as: KLOR-CON take 1 tablet by mouth once daily if needed   prednisoLONE acetate 1 % ophthalmic suspension Commonly known  as: PRED FORTE Place 1 drop into the right eye daily.   predniSONE 10 MG tablet Commonly known as: DELTASONE Take 50 mg daily--taper by 10 mg daily then stop Start taking on: April 01, 2020   rosuvastatin 40 MG tablet Commonly known as: CRESTOR TAKE 1 TABLET(40 MG) BY MOUTH DAILY   solifenacin 10 MG tablet Commonly known as: VESICARE Take 10 mg by mouth daily.   spironolactone 25 MG tablet Commonly known as: ALDACTONE Take 50 mg by mouth 2 (two) times daily.   tiZANidine 4 MG capsule Commonly known as: ZANAFLEX Take 1 capsule (4 mg total) by mouth  3 (three) times daily as needed for muscle spasms. What changed: when to take this   Trulicity 1.5 MG/0.5ML Sopn Generic drug: Dulaglutide Inject 1.5 mg into the skin once a week.       If you experience worsening of your admission symptoms, develop shortness of breath, life threatening emergency, suicidal or homicidal thoughts you must seek medical attention immediately by calling 911 or calling your MD immediately  if symptoms less severe.  You Must read complete instructions/literature along with all the possible adverse reactions/side effects for all the Medicines you take and that have been prescribed to you. Take any new Medicines after you have completely understood and accept all the possible adverse reactions/side effects.   Please note  You were cared for by a hospitalist during your hospital stay. If you have any questions about your discharge medications or the care you received while you were in the hospital after you are discharged, you can call the unit and asked to speak with the hospitalist on call if the hospitalist that took care of you is not available. Once you are discharged, your primary care physician will handle any further medical issues. Please note that NO REFILLS for any discharge medications will be authorized once you are discharged, as it is imperative that you return to your primary care physician  (or establish a relationship with a primary care physician if you do not have one) for your aftercare needs so that they can reassess your need for medications and monitor your lab values. Today   SUBJECTIVE  no new complaints. No respiratory distress doing overall well   VITAL SIGNS:  Blood pressure (!) 136/98, pulse 89, temperature 97.9 F (36.6 C), temperature source Oral, resp. rate 18, height 5\' 5"  (1.651 m), weight 99.8 kg, SpO2 96 %.  I/O:    Intake/Output Summary (Last 24 hours) at 03/31/2020 1132 Last data filed at 03/31/2020 1022 Gross per 24 hour  Intake 480 ml  Output 2375 ml  Net -1895 ml    PHYSICAL EXAMINATION:  GENERAL:  40 y.o.-year-old patient lying in the bed with no acute distress.  LUNGS: DECREASEDbreath sounds bilaterally, no wheezing, rales,rhonchi or crepitation. No use of accessory muscles of respiration.  CARDIOVASCULAR: S1, S2 normal. No murmurs, rubs, or gallops.  ABDOMEN: Soft, non-tender, non-distended. Bowel sounds present. No organomegaly or mass.  NEUROLOGIC: chronic functional paraplegia  PSYCHIATRIC: The patient is alert and oriented x 3.  SKIN: No obvious rash, lesion, or ulcer.   PER RN      DATA REVIEW:   CBC  Recent Labs  Lab 03/30/20 0538  WBC 11.6*  HGB 13.3  HCT 39.1  PLT 248    Chemistries  Recent Labs  Lab 03/29/20 1553 03/30/20 0538  NA 131* 136  K 5.1 4.3  CL 97* 103  CO2 24 24  GLUCOSE 277* 211*  BUN 28* 28*  CREATININE 1.35* 1.18  CALCIUM 9.5 9.0  AST 23  --   ALT 24  --   ALKPHOS 52  --   BILITOT 0.7  --     Microbiology Results   Recent Results (from the past 240 hour(s))  Resp Panel by RT-PCR (Flu A&B, Covid) Nasopharyngeal Swab     Status: None   Collection Time: 03/29/20  4:31 PM   Specimen: Nasopharyngeal Swab; Nasopharyngeal(NP) swabs in vial transport medium  Result Value Ref Range Status   SARS Coronavirus 2 by RT PCR NEGATIVE NEGATIVE Final    Comment: (NOTE) SARS-CoV-2 target  nucleic  acids are NOT DETECTED.  The SARS-CoV-2 RNA is generally detectable in upper respiratory specimens during the acute phase of infection. The lowest concentration of SARS-CoV-2 viral copies this assay can detect is 138 copies/mL. A negative result does not preclude SARS-Cov-2 infection and should not be used as the sole basis for treatment or other patient management decisions. A negative result may occur with  improper specimen collection/handling, submission of specimen other than nasopharyngeal swab, presence of viral mutation(s) within the areas targeted by this assay, and inadequate number of viral copies(<138 copies/mL). A negative result must be combined with clinical observations, patient history, and epidemiological information. The expected result is Negative.  Fact Sheet for Patients:  BloggerCourse.com  Fact Sheet for Healthcare Providers:  SeriousBroker.it  This test is no t yet approved or cleared by the [place] FDA and  has been authorized for detection and/or diagnosis of SARS-CoV-2 by FDA under an Emergency Use Authorization (EUA). This EUA will remain  in effect (meaning this test can be used) for the duration of the COVID-19 declaration under Section 564(b)(1) of the Act, 21 U.S.C.section 360bbb-3(b)(1), unless the authorization is terminated  or revoked sooner.       Influenza A by PCR NEGATIVE NEGATIVE Final   Influenza B by PCR NEGATIVE NEGATIVE Final    Comment: (NOTE) The Xpert Xpress SARS-CoV-2/FLU/RSV plus assay is intended as an aid in the diagnosis of influenza from Nasopharyngeal swab specimens and should not be used as a sole basis for treatment. Nasal washings and aspirates are unacceptable for Xpert Xpress SARS-CoV-2/FLU/RSV testing.  Fact Sheet for Patients: BloggerCourse.com  Fact Sheet for Healthcare Providers: SeriousBroker.it  This  test is not yet approved or cleared by the [place] FDA and has been authorized for detection and/or diagnosis of SARS-CoV-2 by FDA under an Emergency Use Authorization (EUA). This EUA will remain in effect (meaning this test can be used) for the duration of the COVID-19 declaration under Section 564(b)(1) of the Act, 21 U.S.C. section 360bbb-3(b)(1), unless the authorization is terminated or revoked.  Performed at [facility], [address] [place], [place] [address]   CULTURE, BLOOD (ROUTINE X 2) w Reflex to ID Panel     Status: None (Preliminary result)   Collection Time: 03/30/20  1:02 AM   Specimen: BLOOD  Result Value Ref Range Status   Specimen Description BLOOD RIGHT ANTECUBITAL  Final   Special Requests   Final    BOTTLES DRAWN AEROBIC AND ANAEROBIC Blood Culture adequate volume   Culture   Final    NO GROWTH 1 DAY Performed at [facility], [address] [place], [place] [address]    Report Status PENDING  Incomplete  CULTURE, BLOOD (ROUTINE X 2) w Reflex to ID Panel     Status: None (Preliminary result)   Collection Time: 03/30/20  1:02 AM   Specimen: BLOOD  Result Value Ref Range Status   Specimen Description BLOOD BLOOD RIGHT HAND  Final   Special Requests   Final    BOTTLES DRAWN AEROBIC AND ANAEROBIC Blood Culture adequate volume   Culture   Final    NO GROWTH 1 DAY Performed at [facility], [address], [place], [place] [address]    Report Status PENDING  Incomplete    RADIOLOGY:  DG Chest 1 View  Result Date: 03/29/2020 CLINICAL DATA:  Shortness of breath. Sudden change in mental status. Lethargy. EXAM: CHEST  1 VIEW COMPARISON:  Two-view chest x-ray 05/24/2017  FINDINGS: Heart size is exaggerated by low lung volumes. No edema or effusion is present. No focal airspace disease is present. Gaseous distention of the stomach is noted. IMPRESSION: Low lung volumes without acute cardiopulmonary disease. Electronically  Signed   By: Marin Roberts M.D.   On: 03/29/2020 16:21   CT HEAD WO CONTRAST  Result Date: 03/29/2020 CLINICAL DATA:  Altered mental status. EXAM: CT HEAD WITHOUT CONTRAST TECHNIQUE: Contiguous axial images were obtained from the base of the skull through the vertex without intravenous contrast. COMPARISON:  CT head May 03, 2010. FINDINGS: Brain: No evidence of acute large vascular territory infarction, hemorrhage, hydrocephalus, extra-axial collection or mass lesion/mass effect. Mild patchy white matter hypoattenuation, nonspecific but most likely related to chronic microvascular ischemic disease. Vascular: No hyperdense vessel identified. Skull: No acute fracture. Sinuses/Orbits: Visualized sinuses are clear.  Unremarkable orbits. Other: Status post left partial mastoidectomy with nonspecific fluid in the mastoidectomy bowl and opacification of remaining left mastoid air cells. No right mastoid effusion. IMPRESSION: No evidence of acute intracranial abnormality. Electronically Signed   By: Feliberto Harts MD   On: 03/29/2020 17:06     CODE STATUS:     Code Status Orders  (From admission, onward)         Start     Ordered   03/29/20 2019  Full code  Continuous        03/29/20 2020        Code Status History    Date Active Date Inactive Code Status Order ID Comments User Context   05/20/2017 0304 05/22/2017 2157 Full Code [id number]  Cammy Copa, MD Inpatient   05/15/2017 2354 05/18/2017 2030 Full Code [id number]  Altamese Dilling, MD Inpatient   11/25/2016 1327 11/26/2016 1930 Full Code [id number]  Altamese Dilling, MD Inpatient   10/06/2016 1953 10/10/2016 1804 Full Code [id number]  Ramonita Lab, MD Inpatient   Advance Care Planning Activity       TOTAL TIME TAKING CARE OF THIS PATIENT: *35* minutes.    Enedina Finner M.D  [facility]    CC: Primary care physician; Emogene Morgan, MD

## 2020-04-04 LAB — CULTURE, BLOOD (ROUTINE X 2)
Culture: NO GROWTH
Culture: NO GROWTH
Special Requests: ADEQUATE
Special Requests: ADEQUATE

## 2020-04-15 ENCOUNTER — Other Ambulatory Visit: Payer: Self-pay

## 2020-04-15 ENCOUNTER — Emergency Department
Admission: EM | Admit: 2020-04-15 | Discharge: 2020-04-15 | Disposition: A | Discharge status: Home / Self Care | Payer: Medicaid Other | Attending: Emergency Medicine | Admitting: Emergency Medicine

## 2020-04-15 ENCOUNTER — Encounter: Payer: Self-pay | Admitting: Emergency Medicine

## 2020-04-15 ENCOUNTER — Emergency Department: Payer: Medicaid Other

## 2020-04-15 DIAGNOSIS — Z79899 Other long term (current) drug therapy: Secondary | ICD-10-CM | POA: Insufficient documentation

## 2020-04-15 DIAGNOSIS — B20 Human immunodeficiency virus [HIV] disease: Secondary | ICD-10-CM | POA: Insufficient documentation

## 2020-04-15 DIAGNOSIS — Z794 Long term (current) use of insulin: Secondary | ICD-10-CM | POA: Insufficient documentation

## 2020-04-15 DIAGNOSIS — J4541 Moderate persistent asthma with (acute) exacerbation: Secondary | ICD-10-CM | POA: Insufficient documentation

## 2020-04-15 DIAGNOSIS — Z7982 Long term (current) use of aspirin: Secondary | ICD-10-CM | POA: Insufficient documentation

## 2020-04-15 DIAGNOSIS — Z87891 Personal history of nicotine dependence: Secondary | ICD-10-CM | POA: Insufficient documentation

## 2020-04-15 DIAGNOSIS — Z7984 Long term (current) use of oral hypoglycemic drugs: Secondary | ICD-10-CM | POA: Insufficient documentation

## 2020-04-15 DIAGNOSIS — I10 Essential (primary) hypertension: Secondary | ICD-10-CM | POA: Insufficient documentation

## 2020-04-15 DIAGNOSIS — Z7951 Long term (current) use of inhaled steroids: Secondary | ICD-10-CM | POA: Diagnosis not present

## 2020-04-15 DIAGNOSIS — J069 Acute upper respiratory infection, unspecified: Secondary | ICD-10-CM | POA: Diagnosis not present

## 2020-04-15 DIAGNOSIS — E785 Hyperlipidemia, unspecified: Secondary | ICD-10-CM | POA: Insufficient documentation

## 2020-04-15 DIAGNOSIS — E1169 Type 2 diabetes mellitus with other specified complication: Secondary | ICD-10-CM | POA: Diagnosis not present

## 2020-04-15 DIAGNOSIS — Z20822 Contact with and (suspected) exposure to covid-19: Secondary | ICD-10-CM | POA: Insufficient documentation

## 2020-04-15 DIAGNOSIS — R0981 Nasal congestion: Secondary | ICD-10-CM | POA: Diagnosis present

## 2020-04-15 MED ORDER — PREDNISONE 50 MG PO TABS: 50.0000 mg | ORAL_TABLET | Freq: Every day | ORAL | 0 refills | Status: DC

## 2020-04-15 MED ORDER — PSEUDOEPH-BROMPHEN-DM 30-2-10 MG/5ML PO SYRP: 10.0000 mL | ORAL_SOLUTION | Freq: Four times a day (QID) | ORAL | 0 refills | Status: DC | PRN

## 2020-04-15 MED ORDER — BENZONATATE 100 MG PO CAPS: 100.0000 mg | ORAL_CAPSULE | Freq: Four times a day (QID) | ORAL | 0 refills | Status: AC | PRN

## 2020-04-15 MED ORDER — IPRATROPIUM-ALBUTEROL 0.5-2.5 (3) MG/3ML IN SOLN
6.0000 mL | Freq: Once | RESPIRATORY_TRACT | Status: AC
  Administered 2020-04-15: 6 mL via RESPIRATORY_TRACT
  Filled 2020-04-15: qty 6

## 2020-04-15 MED ORDER — DEXAMETHASONE SODIUM PHOSPHATE 10 MG/ML IJ SOLN
10.0000 mg | Freq: Once | INTRAMUSCULAR | Status: AC
  Administered 2020-04-15: 10 mg via INTRAVENOUS
  Filled 2020-04-15: qty 1

## 2020-04-15 NOTE — ED Notes (Signed)
Unit secretary arranging transport home for patient. This RN to complete med necessity form.

## 2020-04-15 NOTE — ED Notes (Signed)
Pt states SOB today and breathing treatments were not helping.Pt SOB with exertion, pt with dry cough. Lungs with expiratory wheezing.

## 2020-04-15 NOTE — ED Notes (Signed)
Cory Campbell from EMS called and reports pt does not meet medical necessity for EMS transport home, reports pt is wheelchair bound and going to independent living. She reports she called family of the pt who states they are unable to transport pt home. ED charge [name] notified.

## 2020-04-15 NOTE — ED Triage Notes (Signed)
First RN Note: pt to ED via [facility] with c/o SOB x several weeks, per EMS pt has hx of asthma, per EMS pt has done all home medications without relief, per EMS 1 albuterol treatment, 125 solumedrol given.   97.9 CBG 105 109/64 109ST 13RR

## 2020-04-15 NOTE — ED Notes (Signed)
Pt in radiology at this time. 

## 2020-04-15 NOTE — ED Provider Notes (Signed)
[facility] Emergency Department Provider Note  ____________________________________________  Time seen: Approximately 7:01 PM  I have reviewed the triage vital signs and the nursing notes.   HISTORY  Chief Complaint Asthma    HPI Cory Campbell is a 40 y.o. male who presents the emergency department complaining of increased asthma symptoms.  Patient states that he was recently admitted to the hospital for respiratory failure.  He states that he has been doing well until a family member came down with a "cold."  Patient states that now the entire household all has symptoms.  He has had nasal congestion and sore throat as well as some increased chest tightness and wheezing.  Patient states that he is using his albuterol 2-3 times a day but is still having some chest tightness and wheezing.  He denies any significant increased work of breathing.  No fevers or chills.  No abdominal complaints.  Patient states that he is here to have treatment prior to getting "a lot worse."  Medical history as described below.         Past Medical History:  Diagnosis Date  . Abrasion or friction burn of foot and toe(s), without mention of infection   . Acid reflux   . Adopted   . Allergic rhinitis, cause unspecified   . Anxiety   . Arthritis   . Arthrogryposis   . Asthma   . Bladder wall thickening   . BPH (benign prostatic hyperplasia)   . Chronic pain syndrome   . Depressive disorder, not elsewhere classified   . Diabetes mellitus without complication (HCC)   . Diverticulitis of colon (without mention of hemorrhage)(562.11)   . ED (erectile dysfunction)   . Herpes genitalis   . History of echocardiogram    a. 10/2016: EF 50-55%, normal wall motion  . History of stress test    a. 06/2011: significant GI uptake artifact, no evidence of ischemia, EF 56%  . HIV infection (HCC)   . HTN (hypertension)   . Hyperlipidemia   . Hypogonadism in male   . Myalgia and myositis,  unspecified   . OAB (overactive bladder)   . Obesity   . Other psoriasis   . Premature baby    born premature  . Seizure disorder (HCC)   . Sleep apnea   . 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 or other cause, without mention of thyrotoxic crisis or storm    hyperthyroidism  . TIA (transient ischemic attack)   . Type II or unspecified type diabetes mellitus with neurological manifestations, not stated as uncontrolled(250.60)     Patient Active Problem List   Diagnosis Date Noted  . Acute asthma exacerbation 03/30/2020  . Acute respiratory failure with hypoxia (HCC) 03/29/2020  . HIV infection (HCC)   . Surgical wound, non healing   . Acute colitis 05/20/2017  . Colitis   . Acute renal failure (HCC) 05/15/2017  . Diarrhea 05/15/2017  . Sepsis (HCC) 05/15/2017  . Abdominal pain 11/25/2016  . HIV (human immunodeficiency virus infection) (HCC) 11/19/2016  . Hypotension 10/08/2016  . Unstable angina (HCC) 10/06/2016  . Shortness of breath 06/23/2015  . Obesity 05/18/2014  . Frequent falls 08/12/2013  . Pain of left clavicle 08/12/2013  . Tachycardia 01/21/2013  . Chronic pain syndrome 08/12/2012  . Obstructive sleep apnea on CPAP 06/07/2012  . Chest pain 06/20/2011  . Hypertension associated with diabetes (HCC) 06/20/2011  . Type 2 diabetes mellitus with complication, without long-term current use of insulin (HCC) 06/20/2011  .  Diabetes mellitus (HCC) 06/20/2011  . Asthma, chronic, unspecified asthma severity, with acute exacerbation 01/18/2008  . Hyperlipidemia associated with type 2 diabetes mellitus (HCC) 08/28/2006  . Essential hypertension 06/27/2005    Past Surgical History:  Procedure Laterality Date  . APPENDECTOMY    . COLOSTOMY    . CORNEAL TRANSPLANT    . feet surgery     both  . HAND SURGERY     left and right  . leg surgery     left and right  . ORTHOPEDIC SURGERY     hands, feet, knees, legs  . tubes in ears     both    Prior to Admission  medications   Medication Sig Start Date End Date Taking? Authorizing Provider  benzonatate (TESSALON PERLES) 100 MG capsule Take 1 capsule (100 mg total) by mouth every 6 (six) hours as needed. 04/15/20 04/15/21 Yes Rayvon Brandvold, Delorise Royals, PA-C  brompheniramine-pseudoephedrine-DM 30-2-10 MG/5ML syrup Take 10 mLs by mouth 4 (four) times daily as needed. 04/15/20  Yes Jadis Pitter, Delorise Royals, PA-C  predniSONE (DELTASONE) 50 MG tablet Take 1 tablet (50 mg total) by mouth daily with breakfast. 04/15/20  Yes Marvia Troost, Delorise Royals, PA-C  acyclovir (ZOVIRAX) 400 MG tablet Take 400 mg by mouth 2 (two) times daily.    [provider]  albuterol (PROVENTIL HFA;VENTOLIN HFA) 108 (90 BASE) MCG/ACT inhaler Inhale 2 puffs into the lungs every 4 (four) hours as needed for wheezing or shortness of breath.     [provider]  albuterol (PROVENTIL) (2.5 MG/3ML) 0.083% nebulizer solution Take 2.5 mg by nebulization every 4 (four) hours as needed for wheezing or shortness of breath.    [provider]  alfuzosin (UROXATRAL) 10 MG 24 hr tablet Take 10 mg by mouth daily.    [provider]  amLODipine (NORVASC) 10 MG tablet Take 10 mg by mouth daily.    [provider]  aspirin EC 81 MG tablet Take 81 mg by mouth daily. Swallow whole.    [provider]  BIKTARVY 50-200-25 MG TABS tablet Take 1 tablet by mouth daily. 11/21/16   [provider]  buprenorphine (BUTRANS) 7.5 MCG/HR Place 7.5 mg onto the skin once a week. Saturday    [provider]  buPROPion (WELLBUTRIN XL) 150 MG 24 hr tablet Take 1 tablet by mouth daily. 11/16/16   [provider]  cetirizine (ZYRTEC) 10 MG tablet Take 10 mg by mouth daily.    [provider]  chlorthalidone (HYGROTON) 25 MG tablet Take 25 mg by mouth daily.    [provider]  citalopram (CELEXA) 20 MG tablet Take 20 mg by mouth daily.    [provider]  cloNIDine (CATAPRES - DOSED IN  MG/24 HR) 0.2 mg/24hr patch Place 0.2 mg onto the skin once a week.    [provider]  diclofenac sodium (VOLTAREN) 1 % GEL Apply 2-4 g topically 4 (four) times daily as needed. For pain.    [provider]  famotidine (PEPCID) 20 MG tablet Take 20 mg by mouth daily.  01/03/16   [provider]  feeding supplement, GLUCERNA SHAKE, (GLUCERNA SHAKE) LIQD Take 237 mLs by mouth 4 (four) times daily.    [provider]  ferrous sulfate 325 (65 FE) MG tablet Take 325 mg by mouth 3 (three) times daily.    [provider]  finasteride (PROSCAR) 5 MG tablet Take 5 mg by mouth daily.    [provider]  fluticasone (  FLONASE) 50 MCG/ACT nasal spray Place 2 sprays into both nostrils daily.    [provider]  fluticasone (FLOVENT HFA) 110 MCG/ACT inhaler Inhale 2 puffs into the lungs 2 (two) times daily.    [provider]  gabapentin (NEURONTIN) 300 MG capsule Take 300 mg by mouth at bedtime.    [provider]  glucose 4 GM chewable tablet Chew 1 tablet by mouth as needed for low blood sugar.    [provider]  ibuprofen (ADVIL,MOTRIN) 600 MG tablet Take 1 tablet (600 mg total) by mouth every 6 (six) hours as needed. 05/05/18   Domenick Gong, MD  insulin degludec (TRESIBA) 100 UNIT/ML FlexTouch Pen Inject 100 Units into the skin 2 (two) times daily.    [provider]  JARDIANCE 10 MG TABS tablet Take 10 mg by mouth daily. 01/19/20   [provider]  ketoconazole (NIZORAL) 2 % shampoo Apply 1 application topically 2 (two) times a week. tues and thursday 08/27/14   [provider]  magnesium oxide (MAG-OX) 400 (241.3 Mg) MG tablet Take 1 tablet (400 mg total) by mouth daily. 05/23/17   Salary, Jetty Duhamel D, MD  Melatonin 5 MG TABS Take 10 mg by mouth at bedtime as needed (sleep).    [provider]  metFORMIN (GLUCOPHAGE-XR) 500 MG 24 hr tablet Take 1,000 mg by mouth 2 (two) times daily.     [provider]  metoprolol tartrate (LOPRESSOR) 100 MG tablet TAKE 1 TABLET(100 MG) BY MOUTH TWICE DAILY 11/05/19   Antonieta Iba, MD  mirtazapine (REMERON) 45 MG tablet Take 45 mg by mouth at bedtime.    [provider]  Multiple Vitamin (MULTIVITAMIN) tablet Take 1 tablet by mouth daily.    [provider]  nortriptyline (PAMELOR) 25 MG capsule Take 50 mg by mouth at bedtime.    [provider]  NOVOLOG FLEXPEN 100 UNIT/ML FlexPen Inject 40 Units into the skin 3 (three) times daily with meals. With every meal and every snack 12/13/15   [provider]  nystatin-triamcinolone (MYCOLOG II) cream Apply 1 application topically 2 (two) times daily.    [provider]  omega-3 acid ethyl esters (LOVAZA) 1 g capsule Take 1 g by mouth 3 (three) times daily.    [provider]  omeprazole (PRILOSEC) 20 MG capsule Take 20 mg by mouth daily.    [provider]  ondansetron (ZOFRAN-ODT) 4 MG disintegrating tablet Take 4 mg by mouth every 8 (eight) hours as needed for nausea or vomiting.    [provider]  polyethylene glycol (MIRALAX / GLYCOLAX) 17 g packet Take 17 g by mouth 3 (three) times daily.    [provider]  potassium chloride SA (K-DUR,KLOR-CON) 20 MEQ tablet take 1 tablet by mouth once daily if needed 02/08/16   Antonieta Iba, MD  prednisoLONE acetate (PRED FORTE) 1 % ophthalmic suspension Place 1 drop into the right eye daily.     [provider]  rosuvastatin (CRESTOR) 40 MG tablet TAKE 1 TABLET(40 MG) BY MOUTH DAILY 04/30/19   Antonieta Iba, MD  solifenacin (VESICARE) 10 MG tablet Take 10 mg by mouth daily.    [provider]  Spacer/Aero-Holding Chambers (AEROCHAMBER PLUS) inhaler Use as instructed 03/23/18   Domenick Gong, MD  spironolactone (ALDACTONE) 25 MG tablet Take 50 mg by mouth 2 (two) times daily.    [provider]  tiZANidine (ZANAFLEX) 4 MG capsule  Take 1 capsule (4 mg total)  by mouth 3 (three) times daily as needed for muscle spasms. Patient taking differently: Take 4 mg by mouth 2 (two) times daily as needed for muscle spasms. 04/14/19   Triplett, Cari B, FNP  TRULICITY 1.5 MG/0.5ML SOPN Inject 1.5 mg into the skin once a week. 01/14/20   [provider]    Allergies Penicillins and Erythromycin base  Family History  Adopted: Yes  Family history unknown: Yes    Social History Social History   Tobacco Use  . Smoking status: Former Smoker    Years: 0.00    Types: Pipe    Quit date: 01/2018    Years since quitting: 2.2  . Smokeless tobacco: Never Used  Vaping Use  . Vaping Use: Some days  . Substances: Nicotine, Flavoring  Substance Use Topics  . Alcohol use: Not Currently    Alcohol/week: 0.0 standard drinks    Comment: used to be moderate  . Drug use: No     Review of Systems  Constitutional: No fever/chills Eyes: No visual changes. No discharge ENT: No upper respiratory complaints. Cardiovascular: no chest pain. Respiratory: Mild nonproductive cough.  Some wheezing or shortness of breath.  No significant increased work related. Gastrointestinal: No abdominal pain.  No nausea, no vomiting.  No diarrhea.  No constipation. Musculoskeletal: Negative for musculoskeletal pain. Skin: Negative for rash, abrasions, lacerations, ecchymosis. Neurological: Negative for headaches, focal weakness or numbness.  10 System ROS otherwise negative.  ____________________________________________   PHYSICAL EXAM:  VITAL SIGNS: ED Triage Vitals  Enc Vitals Group     BP 04/15/20 1726 (!) 121/92     Pulse Rate 04/15/20 1726 (!) 115     Resp 04/15/20 1726 18     Temp 04/15/20 1726 98.4 F (36.9 C)     Temp Source 04/15/20 1726 Oral     SpO2 04/15/20 1726 99 %     Weight 04/15/20 1724 195 lb (88.5 kg)     Height 04/15/20 1724  (1.651 m)     Head Circumference --      Peak Flow --      Pain Score 04/15/20 1724  0     Pain Loc --      Pain Edu? --      Excl. in GC? --      Constitutional: Alert and oriented. Well appearing and in no acute distress. Eyes: Conjunctivae are normal. PERRL. EOMI. Head: Atraumatic. ENT:      Ears:       Nose: No congestion/rhinnorhea.      Mouth/Throat: Mucous membranes are moist.  No oropharyngeal erythema or edema noted.  He was midline. Neck: No stridor.   Hematological/Lymphatic/Immunilogical: No cervical lymphadenopathy. Cardiovascular: Normal rate, regular rhythm. Normal S1 and S2.  Good peripheral circulation. Respiratory: Normal respiratory effort without tachypnea or retractions. Lungs with faint expiratory wheeze in the lower lung fields.  No expiratory wheezing.  No rales or rhonchi.  Good air movement throughout the lungs.Peri Jefferson air entry to the bases with no decreased or absent breath sounds. Gastrointestinal: Bowel sounds 4 quadrants. Soft and nontender to palpation. No guarding or rigidity. No palpable masses. No distention. No CVA tenderness. Musculoskeletal: Full range of motion to all extremities. No gross deformities appreciated. Neurologic:  Normal speech and language. No gross focal neurologic deficits are appreciated.  Skin:  Skin is warm, dry and intact. No rash noted. Psychiatric: Mood and affect are normal. Speech and behavior are normal. Patient exhibits appropriate insight and judgement.  ____________________________________________   LABS (all labs ordered are listed, but only abnormal results are displayed)  Labs Reviewed  SARS CORONAVIRUS 2 (TAT 6-24 HRS)   ____________________________________________  EKG   ____________________________________________  RADIOLOGY I personally viewed and evaluated these images as part of my medical decision making, as well as reviewing the written report by the radiologist.  ED Provider Interpretation: Concur with radiologist finding of no acute cardiopulmonary abnormality  DG Chest 2  View  Result Date: 04/15/2020 CLINICAL DATA:  Shortness of breath, history of recent asthma attack EXAM: CHEST - 2 VIEW COMPARISON:  03/29/2020 FINDINGS: The heart size and mediastinal contours are within normal limits. Both lungs are clear. The visualized skeletal structures are unremarkable. IMPRESSION: No active cardiopulmonary disease. Electronically Signed   By: Alcide Clever M.D.   On: 04/15/2020 18:39    ____________________________________________    PROCEDURES  Procedure(s) performed:    Procedures    Medications  ipratropium-albuterol (DUONEB) 0.5-2.5 (3) MG/3ML nebulizer solution 6 mL (6 mLs Nebulization Given 04/15/20 1916)  dexamethasone (DECADRON) injection 10 mg (10 mg Intravenous Given 04/15/20 1918)     ____________________________________________   INITIAL IMPRESSION / ASSESSMENT AND PLAN / ED COURSE  Pertinent labs & imaging results that were available during my care of the patient were reviewed by me and considered in my medical decision making (see chart for details).  Review of the [place] CSRS was performed in accordance of the [facility] prior to dispensing any controlled drugs.           Patient's diagnosis is consistent with asthma exacerbation, viral URI.  Patient presented to emergency department with increased asthma symptoms over the past 3 days.  Patient reports that the whole family is sick with a "cold."  Patient denied any fevers or chills.  He does have some mild nasal congestion and sore throat and mild cough.  Patient had faint expiratory wheezing on physical exam but had good air movement in the lungs.  X-ray revealed no consolidation concerning for pneumonia.  Covid test was performed but results are still pending at this time.  Patient had DuoNeb treatment with Decadron here in the emergency department after having received albuterol and Solu-Medrol in route by EMS.  Patient has no ongoing adventitious lung sounds.  He is resting comfortably and satting  in the upper 90s on room air.  At this time no indication for admission or further work-up.  Patient will continue his albuterol use at home every 2-4 hours.  I will prescribe the patient short course of steroid as well.  Patient has worsening symptoms he will return to the emergency department.  Follow-up with primary care. Patient is given ED precautions to return to the ED for any worsening or new symptoms.     ____________________________________________  FINAL CLINICAL IMPRESSION(S) / ED DIAGNOSES  Final diagnoses:  Moderate persistent asthma with exacerbation  Viral URI      NEW MEDICATIONS STARTED DURING THIS VISIT:  ED Discharge Orders         Ordered    predniSONE (DELTASONE) 50 MG tablet  Daily with breakfast        04/15/20 2004    brompheniramine-pseudoephedrine-DM 30-2-10 MG/5ML syrup  4 times daily PRN        04/15/20 2004    benzonatate (TESSALON PERLES) 100 MG capsule  Every 6 hours PRN        04/15/20 2004              This chart was  dictated using voice recognition software/Dragon. Despite best efforts to proofread, errors can occur which can change the meaning. Any change was purely unintentional.    Racheal Patches, PA-C 04/15/20 2014    Sharyn Creamer, MD 04/16/20 [phone]

## 2020-04-15 NOTE — ED Notes (Signed)
This RN to bedside to review discharge instructions. Patient states he normally takes an ambulance home when discharge due to being in a wheelchair and not having other transportation home. Will speak with unit secretary to arrange transport.

## 2020-04-15 NOTE — ED Triage Notes (Signed)
Pt comes into the ED via [facility] from home c/o possible asthma attack.  Pt states he has had increased SHOB x couple weeks.  Pt has been using home medications with no relief.  EMS gave 1 albuterol and 125 solu-medrol.  PT states he has also had a cough and congestion.

## 2020-04-16 LAB — SARS CORONAVIRUS 2 (TAT 6-24 HRS): SARS Coronavirus 2: NEGATIVE

## 2020-05-18 NOTE — Progress Notes (Deleted)
Date:  05/18/2020   ID:  [name] Jergens, DOB [birth year], MRN [id number]  Patient Location:  [address] [place] [address]   Provider location:   [facility], [place] office  PCP:  Emogene Morgan, MD  Cardiologist:  [facility]  No chief complaint on file.   History of Present Illness:    Cory Campbell is a 40 y.o. male  Patient has a past medical history of Arthrogryposis multiplex congenita,  chronic pain in his legs,  asthma,  hypertension,  diabetes,  severe obstructive sleep apnea on CPAP since April 2012,  obesity,  seizure disorder,  psoriasis with several evaluations in the emergency room for chest pain on April 23, 06/01/2011.  History of frequent falls, walks with leg braces and canes Prior history of atypical chest pain, often exacerbated by falls Ostomy He presents for routine followup of his hypertension and chest pain, poorly controlled diabetes  In follow-up today reports he is doing well Wife is pregnant.  She presents with him today Expecting a boy in July 2021  Lab work reviewed HBA1C 8.2 in 2020 Trying to eat more salad, per his wife  Denies any recent falls Tolerating Crestor No recent lipid panel available  Denies chest pain or shortness of breath on exertion  Records reviewed  emergency room May 05, 2018 Urinary tract infection Bilateral low back pain Treated with Pyridium, antibiotics, Diflucan  EKG personally reviewed by myself on todays visit Shows normal sinus rhythm rate 81 bpm nonspecific T wave abnormality  Other past medical history reviewed Fall April 13, 2018 shoulder injury, seen in the ER shoulder pain Shoulder is doing better  Seen in the ER January 17, 2018 diarrhea abdominal pain Symptoms better  ostomy repair later in March 2019 at [facility]     Prior CV studies:   The following studies were reviewed today:    Past Medical History:  Diagnosis Date  .  Abrasion or friction burn of foot and toe(s), without mention of infection   . Acid reflux   . Adopted   . Allergic rhinitis, cause unspecified   . Anxiety   . Arthritis   . Arthrogryposis   . Asthma   . Bladder wall thickening   . BPH (benign prostatic hyperplasia)   . Chronic pain syndrome   . Depressive disorder, not elsewhere classified   . Diabetes mellitus without complication (HCC)   . Diverticulitis of colon (without mention of hemorrhage)(562.11)   . ED (erectile dysfunction)   . Herpes genitalis   . History of echocardiogram    a. 10/2016: EF 50-55%, normal wall motion  . History of stress test    a. 06/2011: significant GI uptake artifact, no evidence of ischemia, EF 56%  . HIV infection (HCC)   . HTN (hypertension)   . Hyperlipidemia   . Hypogonadism in male   . Myalgia and myositis, unspecified   . OAB (overactive bladder)   . Obesity   . Other psoriasis   . Premature baby    born premature  . Seizure disorder (HCC)   . Sleep apnea   . 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 or other cause, without mention of thyrotoxic crisis or storm    hyperthyroidism  . TIA (transient ischemic attack)   . Type II or unspecified type diabetes mellitus with neurological manifestations, not stated as uncontrolled(250.60)    Past Surgical History:  Procedure Laterality Date  . APPENDECTOMY    .  COLOSTOMY    . CORNEAL TRANSPLANT    . feet surgery     both  . HAND SURGERY     left and right  . leg surgery     left and right  . ORTHOPEDIC SURGERY     hands, feet, knees, legs  . tubes in ears     both     No outpatient medications have been marked as taking for the 05/19/20 encounter (Appointment) with Antonieta Iba, MD.     Allergies:   Penicillins and Erythromycin base   Social History   Tobacco Use  . Smoking status: Former Smoker    Years: 0.00    Types: Pipe    Quit date: 01/2018    Years since quitting: 2.3  . Smokeless tobacco: Never Used  Vaping  Use  . Vaping Use: Some days  . Substances: Nicotine, Flavoring  Substance Use Topics  . Alcohol use: Not Currently    Alcohol/week: 0.0 standard drinks    Comment: used to be moderate  . Drug use: No     Family Hx: The patient's He was adopted. Family history is unknown by patient.  ROS:   Please see the history of present illness.    Review of Systems  Constitutional: Negative.   Respiratory: Negative.   Cardiovascular: Negative.   Gastrointestinal: Negative.   Musculoskeletal: Negative.        Falls, leg weakness  Neurological: Negative.   Psychiatric/Behavioral: Negative.   All other systems reviewed and are negative.    Labs/Other Tests and Data Reviewed:    Recent Labs: 03/29/2020: ALT 24 03/30/2020: BUN 28; Creatinine, Ser 1.18; Hemoglobin 13.3; Platelets 248; Potassium 4.3; Sodium 136   Recent Lipid Panel Lab Results  Component Value Date/Time   CHOL 163 10/07/2016 05:03 AM   TRIG 126 10/07/2016 05:03 AM   HDL 26 (L) 10/07/2016 05:03 AM   CHOLHDL 6.3 10/07/2016 05:03 AM   LDLCALC 112 (H) 10/07/2016 05:03 AM    Wt Readings from Last 3 Encounters:  04/15/20 195 lb (88.5 kg)  03/29/20 220 lb (99.8 kg)  01/08/20 190 lb (86.2 kg)     Exam:    Vital Signs: Vital signs may also be detailed in the HPI There were no vitals taken for this visit.  Constitutional:  oriented to person, place, and time. No distress.  HENT:  Head: Grossly normal Eyes:  no discharge. No scleral icterus.  Neck: No JVD, no carotid bruits  Cardiovascular: Regular rate and rhythm, no murmurs appreciated Pulmonary/Chest: Clear to auscultation bilaterally, no wheezes or rails Abdominal: Soft.  no distension.  no tenderness.  Musculoskeletal: Normal range of motion Neurological:  normal muscle tone. Coordination normal. No atrophy Skin: Skin warm and dry Psychiatric: normal affect, pleasant  ASSESSMENT & PLAN:    Type 2 diabetes mellitus with complication, without long-term current  use of insulin (HCC) Mild improvement in his A1c Wife working aggressively on his diet Unable to exercise on a regular basis, will need to be done through dietary changes and medications  Chest pain, unspecified type Previous history of musculoskeletal chest pain, Denies any recent chest pain symptoms, no further work-up needed  Frequent falls Long history of falls, injury No recent falls  Essential hypertension Blood pressure well controlled, no changes made to his medications  Obstructive sleep apnea on CPAP Stressed compliance with his CPAP  Shortness of breath Recommend he continue a regular exercise program as tolerated  Hyperlipidemia On Crestor, no recent lipids  available   Total encounter time more than 25 minutes  Greater than 50% was spent in counseling and coordination of care with the patient    Disposition: Follow-up in 12 months   Signed, Julien Nordmann, MD  05/18/2020 5:26 PM    [facility] [address], [place], [place] [address]

## 2020-05-19 ENCOUNTER — Ambulatory Visit: Payer: Medicaid Other | Admitting: Cardiovascular Disease

## 2020-05-19 DIAGNOSIS — R0602 Shortness of breath: Secondary | ICD-10-CM

## 2020-05-19 DIAGNOSIS — E118 Type 2 diabetes mellitus with unspecified complications: Secondary | ICD-10-CM

## 2020-05-19 DIAGNOSIS — I10 Essential (primary) hypertension: Secondary | ICD-10-CM

## 2020-05-19 DIAGNOSIS — G4733 Obstructive sleep apnea (adult) (pediatric): Secondary | ICD-10-CM

## 2020-05-19 DIAGNOSIS — E782 Mixed hyperlipidemia: Secondary | ICD-10-CM

## 2020-05-19 DIAGNOSIS — R079 Chest pain, unspecified: Secondary | ICD-10-CM

## 2020-05-26 ENCOUNTER — Encounter: Payer: Self-pay | Admitting: Physician Assistant

## 2020-05-26 ENCOUNTER — Other Ambulatory Visit: Payer: Self-pay

## 2020-05-26 ENCOUNTER — Ambulatory Visit (INDEPENDENT_AMBULATORY_CARE_PROVIDER_SITE_OTHER): Payer: Medicaid Other | Admitting: Physician Assistant

## 2020-05-26 VITALS — BP 140/90 | HR 121 | Ht 66.0 in | Wt 184.0 lb

## 2020-05-26 DIAGNOSIS — R079 Chest pain, unspecified: Secondary | ICD-10-CM

## 2020-05-26 DIAGNOSIS — G4733 Obstructive sleep apnea (adult) (pediatric): Secondary | ICD-10-CM | POA: Diagnosis not present

## 2020-05-26 DIAGNOSIS — R209 Unspecified disturbances of skin sensation: Secondary | ICD-10-CM

## 2020-05-26 DIAGNOSIS — R252 Cramp and spasm: Secondary | ICD-10-CM

## 2020-05-26 DIAGNOSIS — I10 Essential (primary) hypertension: Secondary | ICD-10-CM

## 2020-05-26 DIAGNOSIS — Z9989 Dependence on other enabling machines and devices: Secondary | ICD-10-CM

## 2020-05-26 DIAGNOSIS — G8929 Other chronic pain: Secondary | ICD-10-CM

## 2020-05-26 DIAGNOSIS — Z9181 History of falling: Secondary | ICD-10-CM

## 2020-05-26 DIAGNOSIS — J45909 Unspecified asthma, uncomplicated: Secondary | ICD-10-CM

## 2020-05-26 DIAGNOSIS — R000 Tachycardia, unspecified: Secondary | ICD-10-CM

## 2020-05-26 DIAGNOSIS — I739 Peripheral vascular disease, unspecified: Secondary | ICD-10-CM

## 2020-05-26 DIAGNOSIS — E118 Type 2 diabetes mellitus with unspecified complications: Secondary | ICD-10-CM

## 2020-05-26 DIAGNOSIS — Q743 Arthrogryposis multiplex congenita: Secondary | ICD-10-CM

## 2020-05-26 MED ORDER — LOSARTAN POTASSIUM 50 MG PO TABS: 50.0000 mg | ORAL_TABLET | Freq: Every day | ORAL | 0 refills | Status: DC

## 2020-05-26 MED ORDER — ENTRESTO 49-51 MG PO TABS: 1.0000 | ORAL_TABLET | Freq: Two times a day (BID) | ORAL | 5 refills | Status: DC

## 2020-05-26 NOTE — Progress Notes (Signed)
Office Visit    Patient Name: Cory Campbell Date of Encounter: 05/26/2020  PCP:  Cory Morgan, MD   [facility]  Cardiologist:  Dr. Mariah Campbell Advanced Practice Provider:  No care team member to display Electrophysiologist:  None   Chief Complaint    Chief Complaint  Patient presents with  . Other    12 month follow up. Meds reviewed verbally with patient.     40 y.o. 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  Past Medical History    Past Medical History:  Diagnosis Date  . Abrasion or friction burn of foot and toe(s), without mention of infection   . Acid reflux   . Adopted   . Allergic rhinitis, cause unspecified   . Anxiety   . Arthritis   . Arthrogryposis   . Asthma   . Bladder wall thickening   . BPH (benign prostatic hyperplasia)   . Chronic pain syndrome   . Depressive disorder, not elsewhere classified   . Diabetes mellitus without complication (HCC)   . Diverticulitis of colon (without mention of hemorrhage)(562.11)   . ED (erectile dysfunction)   . Herpes genitalis   . History of echocardiogram    a. 10/2016: EF 50-55%, normal wall motion  . History of stress test    a. 06/2011: significant GI uptake artifact, no evidence of ischemia, EF 56%  . HIV infection (HCC)   . HTN (hypertension)   . Hyperlipidemia   . Hypogonadism in male   . Myalgia and myositis, unspecified   . OAB (overactive bladder)   . Obesity   . Other psoriasis   . Premature baby    born premature  . Seizure disorder (HCC)   . Sleep apnea   . 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 or other cause, without mention of  thyrotoxic crisis or storm    hyperthyroidism  . TIA (transient ischemic attack)   . Type II or unspecified type diabetes mellitus with neurological manifestations, not stated as uncontrolled(250.60)    Past Surgical History:  Procedure Laterality Date  . APPENDECTOMY    . COLOSTOMY    . CORNEAL TRANSPLANT    . feet surgery     both  . HAND SURGERY     left and right  . leg surgery     left and right  . ORTHOPEDIC SURGERY     hands, feet, knees, legs  . tubes in ears     both    Allergies  Allergies  Allergen Reactions  . Penicillins Hives, Itching and Rash    Has patient had a PCN reaction causing immediate rash, facial/tongue/throat swelling, SOB or lightheadedness with hypotension: Yes Has patient had a PCN reaction causing severe rash involving mucus membranes or skin necrosis: No Has patient had a PCN reaction that required hospitalization No Has patient had a PCN reaction occurring within the last 10 years: No If all of the above answers are "NO", then may proceed with Cephalosporin use.   . Erythromycin Base Itching and Rash    History of Present Illness    Cory Campbell is a 40 y.o. male with PMH as above.  Family  history includes diabetes and heart disease per PCP paperwork followed at the [facility]).  He has a history of previous tobacco use from 05/2012 to the summer 2021.  No alcohol or drug use reported.  He divorced in 2012 and is remarried with 2 daughters and 1 son.  His son Cory Campbell was born 08/2019 and is doing well.  He lives with his mother as indicated on previous PCP documentation.    He has history of poorly controlled DM2 on ACE inhibitor and with eye exam performed by [facility] with PCP last documented eye exam 2013.  This is managed by PCP and also endocrinology as of 2015.   He has been seen in the emergency department in the past with hyperglycemia and glucose elevated above 300.  He was diagnosed with  hypertension at the age of 56, which has been uncontrolled on beta-blocker, ACE inhibitor, calcium channel blocker, thiazide diuretic, clonidine patch, and hydralazine.  2012 CT at [facility] with normal imaging of kidneys and adrenals.  Renal artery Doppler ultrasound at [facility] 03/2010 negative.  Previous [place] facility work-up not convincing for primary hyperaldosteronism.    He has history of severe sleep apnea on a CPAP with reported compliance.    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   He has been seen in the ED many times for atypical CP. Previous 2013 MPI ruled overall low risk.  2018 echo with EF 50 to 55%, NR WMA.  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  He continues to ambulate with bilateral lower extremity braces.   At the last clinic visit 05/2019, he denied any CP or SOB. No recent falls. His wife was pregnant and expecting a boy 08/2019. A1C not well controlled with pt attempting to eat more salad. No recent lipid panel on Crestor. Compliance with CPAP was stressed, as well as dietary changes, as he was unable to exercise.    Today, 05/26/2020, he returns to clinic for 1 year follow-up.  He and his wife have a baby boy, named Cory Campbell.  Cory Campbell is sleeping through the night.  He denies any chest pain or shortness of breath.  He reports a headache, as previously noted above, and which is not concerning to him.  He reports staying well-hydrated with at least 4 large glasses of water per day.  He does drink 2 cups of coffee or tea each morning.  He is not checking his blood pressure, as he recently broke his blood pressure cuff but is hoping to soon replace this.  He reports concern regarding possible PAD, and after reading the poster on our office door.  He reports cold lower extremities and paresthesias in both his upper and lower extremities.  He reports cramping in both his upper and lower extremities.  He states his feet hurt all the time. We  discussed that this is likely attributed to his poorly controlled DM2; however, given his level of concern and RF, will obtain lower extremity studies to rule out arterial disease and per patient request after discussion today. He also reports feeling like he is swollen from the knee down bilaterally, though no significant lower extremity edema noted on exam, reassuring to him.  No other reported signs or symptoms of volume overload.  EKG today significant for sinus tachycardia, which does seem to be a frequent finding in the past.  Due to the caffeine as noted above, he reports  frequent use of his albuterol, which we discussed is not recommended from a cardiovascular standpoint, given this is likely to induce tachycardia, which overall puts stress on the heart.  He denies any tachypalpitations today.  No presyncope or syncope.  No recent falls, given his history of falls in the past.  He overall feels that he is doing well.  No signs or symptoms of bleeding.  He reports compliance with the CPAP.  He is eating more salad.   ----- PCP sent over paperwork. On review of PCP BP, pressures have ranged from 162/119, 158/119, 155/102, 162/119, 113/77, and 179/128.  Heart rate has remained tachycardic during office visits.  Above history updated to reflect additional PMH and HPI provided. Medication list needed updated given several medication changes from that of our most recent list.  Recommend we continue to compare these medication lists.  See copied labs from PCP as below  Home Medications    Current Outpatient Medications on File Prior to Visit  Medication Sig Dispense Refill  . acyclovir (ZOVIRAX) 400 MG tablet Take 400 mg by mouth 2 (two) times daily.    Marland Kitchen albuterol (PROVENTIL HFA;VENTOLIN HFA) 108 (90 BASE) MCG/ACT inhaler Inhale 2 puffs into the lungs every 4 (four) hours as needed for wheezing or shortness of breath.     Marland Kitchen albuterol (PROVENTIL) (2.5 MG/3ML) 0.083% nebulizer solution Take 2.5 mg  by nebulization every 4 (four) hours as needed for wheezing or shortness of breath.    . alfuzosin (UROXATRAL) 10 MG 24 hr tablet Take 10 mg by mouth daily.    Marland Kitchen amLODipine (NORVASC) 10 MG tablet Take 10 mg by mouth daily.    Marland Kitchen aspirin EC 81 MG tablet Take 81 mg by mouth daily. Swallow whole.    . benzonatate (TESSALON PERLES) 100 MG capsule Take 1 capsule (100 mg total) by mouth every 6 (six) hours as needed. 30 capsule 0  . BIKTARVY 50-200-25 MG TABS tablet Take 1 tablet by mouth daily.  1  . brompheniramine-pseudoephedrine-DM 30-2-10 MG/5ML syrup Take 10 mLs by mouth 4 (four) times daily as needed. 200 mL 0  . buprenorphine (BUTRANS) 7.5 MCG/HR Place 7.5 mg onto the skin once a week. Saturday    . buPROPion (WELLBUTRIN XL) 150 MG 24 hr tablet Take 1 tablet by mouth daily.  0  . cetirizine (ZYRTEC) 10 MG tablet Take 10 mg by mouth daily.    . chlorthalidone (HYGROTON) 25 MG tablet Take 25 mg by mouth daily.    . citalopram (CELEXA) 20 MG tablet Take 20 mg by mouth daily.    . cloNIDine (CATAPRES - DOSED IN MG/24 HR) 0.2 mg/24hr patch Place 0.2 mg onto the skin once a week.    . diclofenac sodium (VOLTAREN) 1 % GEL Apply 2-4 g topically 4 (four) times daily as needed. For pain.    . famotidine (PEPCID) 20 MG tablet Take 20 mg by mouth daily.     . feeding supplement, GLUCERNA SHAKE, (GLUCERNA SHAKE) LIQD Take 237 mLs by mouth 4 (four) times daily.    . ferrous sulfate 325 (65 FE) MG tablet Take 325 mg by mouth 3 (three) times daily.    . finasteride (PROSCAR) 5 MG tablet Take 5 mg by mouth daily.    . fluticasone (FLONASE) 50 MCG/ACT nasal spray Place 2 sprays into both nostrils daily.    . fluticasone (FLOVENT HFA) 110 MCG/ACT inhaler Inhale 2 puffs into the lungs 2 (two) times daily.    Marland Kitchen gabapentin (  NEURONTIN) 300 MG capsule Take 300 mg by mouth at bedtime.    Marland Kitchen glucose 4 GM chewable tablet Chew 1 tablet by mouth as needed for low blood sugar.    . ibuprofen (ADVIL,MOTRIN) 600 MG tablet  Take 1 tablet (600 mg total) by mouth every 6 (six) hours as needed. 30 tablet 0  . insulin degludec (TRESIBA) 100 UNIT/ML FlexTouch Pen Inject 100 Units into the skin 2 (two) times daily.    Marland Kitchen JARDIANCE 10 MG TABS tablet Take 10 mg by mouth daily.    Marland Kitchen ketoconazole (NIZORAL) 2 % shampoo Apply 1 application topically 2 (two) times a week. tues and thursday    . magnesium oxide (MAG-OX) 400 (241.3 Mg) MG tablet Take 1 tablet (400 mg total) by mouth daily. 30 tablet 0  . Melatonin 5 MG TABS Take 10 mg by mouth at bedtime as needed (sleep).    . metFORMIN (GLUCOPHAGE-XR) 500 MG 24 hr tablet Take 1,000 mg by mouth 2 (two) times daily.    . metoprolol tartrate (LOPRESSOR) 100 MG tablet TAKE 1 TABLET(100 MG) BY MOUTH TWICE DAILY 180 tablet 1  . mirtazapine (REMERON) 45 MG tablet Take 45 mg by mouth at bedtime.    . Multiple Vitamin (MULTIVITAMIN) tablet Take 1 tablet by mouth daily.    . nortriptyline (PAMELOR) 25 MG capsule Take 50 mg by mouth at bedtime.    Marland Kitchen NOVOLOG FLEXPEN 100 UNIT/ML FlexPen Inject 40 Units into the skin 3 (three) times daily with meals. With every meal and every snack  4  . nystatin-triamcinolone (MYCOLOG II) cream Apply 1 application topically 2 (two) times daily.    Marland Kitchen omega-3 acid ethyl esters (LOVAZA) 1 g capsule Take 1 g by mouth 3 (three) times daily.    Marland Kitchen omeprazole (PRILOSEC) 20 MG capsule Take 20 mg by mouth daily.    . ondansetron (ZOFRAN-ODT) 4 MG disintegrating tablet Take 4 mg by mouth every 8 (eight) hours as needed for nausea or vomiting.    . polyethylene glycol (MIRALAX / GLYCOLAX) 17 g packet Take 17 g by mouth 3 (three) times daily.    . potassium chloride SA (K-DUR,KLOR-CON) 20 MEQ tablet take 1 tablet by mouth once daily if needed 90 tablet 3  . prednisoLONE acetate (PRED FORTE) 1 % ophthalmic suspension Place 1 drop into the right eye daily.     . predniSONE (DELTASONE) 50 MG tablet Take 1 tablet (50 mg total) by mouth daily with breakfast. 5 tablet 0  .  rosuvastatin (CRESTOR) 40 MG tablet TAKE 1 TABLET(40 MG) BY MOUTH DAILY 90 tablet 0  . solifenacin (VESICARE) 10 MG tablet Take 10 mg by mouth daily.    Marland Kitchen Spacer/Aero-Holding Chambers (AEROCHAMBER PLUS) inhaler Use as instructed 1 each 2  . spironolactone (ALDACTONE) 25 MG tablet Take 50 mg by mouth 2 (two) times daily.    Marland Kitchen tiZANidine (ZANAFLEX) 4 MG capsule Take 1 capsule (4 mg total) by mouth 3 (three) times daily as needed for muscle spasms. (Patient taking differently: Take 4 mg by mouth 2 (two) times daily as needed for muscle spasms.) 30 capsule 0  . TRULICITY 1.5 MG/0.5ML SOPN Inject 1.5 mg into the skin once a week.     No current facility-administered medications on file prior to visit.   Current medication list differs from that of PCP provided medication list Recheck at each RTC  Review of Systems    He denies chest pain, palpitations, dyspnea, pnd, orthopnea, n, v, dizziness, syncope,  weight gain, or early satiety. He reports b/l LEE. He has bilateral upper and LE paresthesias, cramping, and coldness sensation. He reports headache.  No recent falls.  No signs or symptoms of bleeding.  All other systems reviewed and are otherwise negative except as noted above.  Physical Exam    VS:  BP 140/90 (BP Location: Right Arm, Patient Position: Sitting, Cuff Size: Normal)   Pulse (!) 121   Ht 5\' 6"  (1.676 m)   Wt 184 lb (83.5 kg)   BMI 29.70 kg/m  , BMI Body mass index is 29.7 kg/m. GEN: Well nourished, well developed, in no acute distress. Seated in wheelchair.  HEENT: normal. Neck: Supple.  JVD difficult to assess due to body habitus.  No carotid bruits or masses. Cardiac: Tachycardic but regular, no murmurs, rubs, or gallops. No clubbing, cyanosis, edema.  Radials/DP/PT 2+ and equal bilaterally.  Respiratory:  Respirations regular and unlabored, clear to auscultation bilaterally. GI: Soft, nontender, nondistended, BS + x 4. MS: muscle wasting noted, bilateral LE braces in  place Skin: warm and dry, no rash. Neuro:  Strength and sensation are intact. Psych: Normal affect.  Accessory Clinical Findings    ECG personally reviewed by me today - ST, 121 bpm, poor conduction II, avF, possible LAE- no acute changes.  VITALS Reviewed today   Temp Readings from Last 3 Encounters:  04/15/20 98.4 F (36.9 C) (Oral)  03/31/20 98.3 F (36.8 C)  01/08/20 97.8 F (36.6 C) (Oral)   BP Readings from Last 3 Encounters:  04/15/20 105/74  03/31/20 (!) 154/113  01/08/20 107/65   Pulse Readings from Last 3 Encounters:  04/15/20 100  03/31/20 88  01/08/20 80    Wt Readings from Last 3 Encounters:  04/15/20 195 lb (88.5 kg)  03/29/20 220 lb (99.8 kg)  01/08/20 190 lb (86.2 kg)     LABS  reviewed today   Lab Results  Component Value Date   WBC 11.6 (H) 03/30/2020   HGB 13.3 03/30/2020   HCT 39.1 03/30/2020   MCV 86.3 03/30/2020   PLT 248 03/30/2020   Lab Results  Component Value Date   CREATININE 1.18 03/30/2020   BUN 28 (H) 03/30/2020   NA 136 03/30/2020   K 4.3 03/30/2020   CL 103 03/30/2020   CO2 24 03/30/2020   Lab Results  Component Value Date   ALT 24 03/29/2020   AST 23 03/29/2020   ALKPHOS 52 03/29/2020   BILITOT 0.7 03/29/2020   Lab Results  Component Value Date   CHOL 163 10/07/2016   HDL 26 (L) 10/07/2016   LDLCALC 112 (H) 10/07/2016   TRIG 126 10/07/2016   CHOLHDL 6.3 10/07/2016    Lab Results  Component Value Date   HGBA1C 7.1 (H) 03/30/2020   No results found for: TSH  04/29/20 labs obtained from trials.  [facility] WBC 6.0, RBC 4.48, hemoglobin 13.1, hematocrit 39.7, platelets 232, glucose 111, creatinine 0.89, BUN 16, sodium 139, potassium 4.5, calcium 8.8, albumin 4.1, AST 16, ALT 23   STUDIES/PROCEDURES reviewed today   Cardiac monitoring 01/2017 Normal sinus rhythm with rare PVCs  Echo 10/2016 - Left ventricle: Distal septal apical hypokinesis. The cavity size  was normal. Systolic function  was normal. The estimated ejection  fraction was in the range of 50% to 55%. Wall motion was normal;  there were no regional wall motion abnormalities.  - Atrial septum: No defect or patent foramen ovale was identified.   MPI 2018 Overall low  risk  Assessment & Plan    Hypertension, goal BP < 130/80 --BP today sub-optimal, discussed with pt, as he is on multiple antihypertensive-ls, as well as several medications that could be exacerbating his BP. Previous BP / renal workup as detailed above and including RAS workup negative. Reviewed goal BP 130/80 or lower. Reviewed keeping total daily fluids under 2L and salt under 2g. He is currently not monitoring his BP at home, given he broke his BP cuff. Recommended replacement and that he call the office if BP consistently elevated over goal between visits. In addition to monitoring Na/fluids, reduced caffeine and albuterol frequency discussed. Also discussed was prednisone and its effect on HR/BP/fluid with pt understanding. He reports he is no longer taking the oral prednisone currently listed on his medication list, though he still uses the eye drops. Ideally, would avoid NSAIDs as well, given long term use and its effects on GI, cardiovascular, and renal health. Discussed NSAIDs could also contribute to elevated BP with pt understanding. Ongoing CPAP use stressed, as this can also contribute to elevated BP.  Continue amlodipine  daily, chlorthalidone  daily, clonidine patch (for now), hydralazine per PCP notes, spironolactone dose (dose will need confirmed with PCP), and metoprolol  BID.   Ideally, wean off clonidine patch completely and reduce dose of spironolactone after the below changes. As above in HPI, recommend always recheck our medication list against that of his PCP medication list, given discrepancies noted today.  DiscontinuedACE / lisinopril today..   Start losartan  daily x1 week then transition to Entresto 49-51mg   BID if BP/Cr/insurance permits.  1 week is longer than the required length of time for ACEi washout; however, to ensure recheck of labs /room in BP for Entresto (pt BP cuff broken, will delay start of Entresto until after able to be seen again in office. Will recheck BP and labs at that time. This will ensure safe transition, as well as also allow pt to ensure Sherryll Burger is covered by insurance. He has been educated Entresto should not be taken at the same time as his lisinopril or Losartan.  Bilateral lower extremity pain and coldness --As above in HPI. Will obtain bilateral LE studies to rule out arterial dz. Consider that MSK disorder could be the etiology of his bilateral LE pain, as well as his poorly controlled DM2.  Discussed recommendation for risk factor modification, including glycemic control.   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 Previous history of atypical CP, attributed to MSK etiology given known MSK disorder. 2018 MPI low risk. 2018 echo EF 50-55%. Monitoring NSR with PVCs as above. Given no active sx, no indication for repeat ischemic workup at this time. Recommend ongoing risk factor modification.   History of palpitations / PVCs  --No recent palpitations. Previous monitor as above with PVCs and NSR. Continue current BB.  Sinus tachycardia --Asx. No feeling of racing HR. Discussed ST likely 2/2 deconditioning, as well as any caffeine, albuterol, +/- prednisone.   History of falls  --No recent falls. On review of EMR, previous long history of falls and injury. Continue to monitor.   OSA on CPAP --Reports compliance. Ongoing compliance recommended.  Asthma --Reduce albuterol dosing if able - per pulmonology. He currently reports use at twice per day. Given SE profile of albuterol, discussed that less frequent dosing is preferred from a cardiovascular standpoint.  Chronic pain disorder / arthrogryposis multiplex congenita --Consider as  playing a role in previous atypical  CP, as well as possibly contributing to bilateral LE and UE pain as above in HPI. Will still obtain LE studies per pt preference. Continue to monitor per PCP.  DM2 --Still not well controlled on review of labs. Glycemic control recommended from a cardiovascular standpoint. Ongoing heart healthy and low carb diet recommended, especially given unable to exercise on a regular basis given MSK disorder as directly above.   Previous tobacco use - Ongoing cessation recommended.  Medication list --At least 30 minutes spent rectifying differences between medication list at PCP versus our office, as well as updating information provided by PCP to HPI and lab sections above. -----------------  Medication changes: Stop Lisinopril. Complete 1 week of Losartan while reassess BP and renal function (also lets us check insurance and complete ACE washout). After 1 week of Losartan, start Entresto 49-51mg  BID if Cr/K/BP/insurance allow. With Entresto start, discontinue Losartan. Following Entresto, we will hopefully be able to stop his clonidine patch and reduce spironolactone dose as above.  Labs ordered: BMET today, 1-2 weeks, lipid, liver Studies / Imaging ordered: Bilateral LE study  Future considerations: None at this time Disposition: RTC in 2 weeks, given home BP cuff broken, so needs BP recheck and labs before start Entresto. (Initially planned for 1 month but given no BP cuff able to be provided today, will need him back in 2 weeks)  *Please be aware that the above documentation was completed voice recognition software and may contain dictation errors.      Lennon AlstromJacquelyn D Constance Hackenberg, PA-C 05/26/2020

## 2020-05-26 NOTE — Patient Instructions (Signed)
Medication Instructions:  Your physician has recommended you make the following change in your medication:   STOP Lisinopril  START Losartan 50mg  daily for ONE WEEK   AFTER complete Losartan for one week, the next day START Entresto 49/51mg  TWICE daily   *If you need a refill on your cardiac medications before your next appointment, please call your pharmacy*   Lab Work:  We will check labs at your next visit: BMET, Lipid, Liver panel  Testing/Procedures:  Your physician has requested that you have a lower extremity arterial duplex. During this test, exercise and ultrasound are used to evaluate arterial blood flow in the legs. Allow one hour for this exam. There are no restrictions or special instructions.   Follow-Up: At [facility], you and your health needs are our priority.  As part of our continuing mission to provide you with exceptional heart care, we have created designated Provider Care Teams.  These Care Teams include your primary Cardiologist (physician) and Advanced Practice Providers (APPs -  Physician Assistants and Nurse Practitioners) who all work together to provide you with the care you need, when you need it.  We recommend signing up for the patient portal called "MyChart".  Sign up information is provided on this After Visit Summary.  MyChart is used to connect with patients for Virtual Visits (Telemedicine).  Patients are able to view lab/test results, encounter notes, upcoming appointments, etc.  Non-urgent messages can be sent to your provider as well.   To learn more about what you can do with MyChart, go to [facility].    Your next appointment:   1 month(s) with lab work at visit  The format for your next appointment:   In Person  Provider:   You may see Dr. [email or web address] or one of the following Advanced Practice Providers on your designated Care Team:      Mariah Milling, Marisue Ivan

## 2020-06-07 ENCOUNTER — Emergency Department
Admission: EM | Admit: 2020-06-07 | Discharge: 2020-06-07 | Disposition: A | Discharge status: Home / Self Care | Payer: Medicaid Other | Attending: Emergency Medicine | Admitting: Emergency Medicine

## 2020-06-07 ENCOUNTER — Other Ambulatory Visit: Payer: Self-pay

## 2020-06-07 ENCOUNTER — Emergency Department: Payer: Medicaid Other

## 2020-06-07 DIAGNOSIS — E1149 Type 2 diabetes mellitus with other diabetic neurological complication: Secondary | ICD-10-CM | POA: Insufficient documentation

## 2020-06-07 DIAGNOSIS — N3090 Cystitis, unspecified without hematuria: Secondary | ICD-10-CM

## 2020-06-07 DIAGNOSIS — Z87891 Personal history of nicotine dependence: Secondary | ICD-10-CM | POA: Diagnosis not present

## 2020-06-07 DIAGNOSIS — E1169 Type 2 diabetes mellitus with other specified complication: Secondary | ICD-10-CM | POA: Diagnosis not present

## 2020-06-07 DIAGNOSIS — R319 Hematuria, unspecified: Secondary | ICD-10-CM | POA: Diagnosis present

## 2020-06-07 DIAGNOSIS — B20 Human immunodeficiency virus [HIV] disease: Secondary | ICD-10-CM | POA: Diagnosis not present

## 2020-06-07 DIAGNOSIS — I10 Essential (primary) hypertension: Secondary | ICD-10-CM | POA: Insufficient documentation

## 2020-06-07 DIAGNOSIS — R310 Gross hematuria: Secondary | ICD-10-CM

## 2020-06-07 DIAGNOSIS — Z79899 Other long term (current) drug therapy: Secondary | ICD-10-CM | POA: Insufficient documentation

## 2020-06-07 DIAGNOSIS — Z794 Long term (current) use of insulin: Secondary | ICD-10-CM | POA: Insufficient documentation

## 2020-06-07 DIAGNOSIS — J45901 Unspecified asthma with (acute) exacerbation: Secondary | ICD-10-CM | POA: Insufficient documentation

## 2020-06-07 DIAGNOSIS — E119 Type 2 diabetes mellitus without complications: Secondary | ICD-10-CM | POA: Insufficient documentation

## 2020-06-07 DIAGNOSIS — Z7982 Long term (current) use of aspirin: Secondary | ICD-10-CM | POA: Diagnosis not present

## 2020-06-07 DIAGNOSIS — N3091 Cystitis, unspecified with hematuria: Secondary | ICD-10-CM | POA: Insufficient documentation

## 2020-06-07 DIAGNOSIS — E785 Hyperlipidemia, unspecified: Secondary | ICD-10-CM | POA: Diagnosis not present

## 2020-06-07 DIAGNOSIS — Z7984 Long term (current) use of oral hypoglycemic drugs: Secondary | ICD-10-CM | POA: Insufficient documentation

## 2020-06-07 LAB — CBC WITH DIFFERENTIAL/PLATELET
Abs Immature Granulocytes: 0.02 10*3/uL (ref 0.00–0.07)
Basophils Absolute: 0 10*3/uL (ref 0.0–0.1)
Basophils Relative: 0 %
Eosinophils Absolute: 0.1 10*3/uL (ref 0.0–0.5)
Eosinophils Relative: 2 %
HCT: 44.9 % (ref 39.0–52.0)
Hemoglobin: 14.7 g/dL (ref 13.0–17.0)
Immature Granulocytes: 0 %
Lymphocytes Relative: 16 %
Lymphs Abs: 0.9 10*3/uL (ref 0.7–4.0)
MCH: 28.7 pg (ref 26.0–34.0)
MCHC: 32.7 g/dL (ref 30.0–36.0)
MCV: 87.5 fL (ref 80.0–100.0)
Monocytes Absolute: 0.5 10*3/uL (ref 0.1–1.0)
Monocytes Relative: 9 %
Neutro Abs: 4.2 10*3/uL (ref 1.7–7.7)
Neutrophils Relative %: 73 %
Platelets: 178 10*3/uL (ref 150–400)
RBC: 5.13 MIL/uL (ref 4.22–5.81)
RDW: 14.6 % (ref 11.5–15.5)
WBC: 5.8 10*3/uL (ref 4.0–10.5)
nRBC: 0 % (ref 0.0–0.2)

## 2020-06-07 LAB — URINALYSIS, COMPLETE (UACMP) WITH MICROSCOPIC
Bacteria, UA: NONE SEEN
Bilirubin Urine: NEGATIVE
Glucose, UA: >=500 mg/dL — AB
Ketones, ur: NEGATIVE mg/dL
Leukocytes,Ua: NEGATIVE
Nitrite: NEGATIVE
Protein, ur: 100 mg/dL — AB
RBC / HPF: >50 RBC/hpf — ABNORMAL HIGH (ref 0–5)
Specific Gravity, Urine: 1.029 (ref 1.005–1.030)
pH: 6 (ref 5.0–8.0)

## 2020-06-07 LAB — BASIC METABOLIC PANEL
Anion gap: 10 (ref 5–15)
BUN: 14 mg/dL (ref 6–20)
CO2: 27 mmol/L (ref 22–32)
Calcium: 8.9 mg/dL (ref 8.9–10.3)
Chloride: 103 mmol/L (ref 98–111)
Creatinine, Ser: 0.97 mg/dL (ref 0.61–1.24)
GFR, Estimated: >60 mL/min (ref >60)
Glucose, Bld: 110 mg/dL — ABNORMAL HIGH (ref 70–99)
Potassium: 3.5 mmol/L (ref 3.5–5.1)
Sodium: 140 mmol/L (ref 135–145)

## 2020-06-07 MED ORDER — NITROFURANTOIN MONOHYD MACRO 100 MG PO CAPS: 100.0000 mg | ORAL_CAPSULE | Freq: Two times a day (BID) | ORAL | 0 refills | Status: AC

## 2020-06-07 MED ORDER — NITROFURANTOIN MONOHYD MACRO 100 MG PO CAPS
100.0000 mg | ORAL_CAPSULE | Freq: Once | ORAL | Status: AC
  Administered 2020-06-07: 100 mg via ORAL
  Filled 2020-06-07: qty 1

## 2020-06-07 NOTE — ED Provider Notes (Signed)
MSE was initiated and I personally evaluated the patient and placed orders (if any) at  6:42 AM on Jun 07, 2020.  The patient appears stable so that the remainder of the MSE may be completed by another provider.   Nita Sickle, MD 06/07/20 [phone]

## 2020-06-07 NOTE — Discharge Instructions (Signed)
As we discussed, it looks like you have a bladder infection/UTI causing your symptoms.  You are being discharged with 5 days of antibiotics called Macrobid/nitrofurantoin.  Please take twice daily for the next 5 days and finish all 10 pills, even if your symptoms are improving.  Follow-up with your urologist and continue to take your medicines for your prostate.  If you develop any further worsening symptoms despite the medications, fevers or side/back pain with your symptoms, please return to the ED.

## 2020-06-07 NOTE — ED Provider Notes (Signed)
[facility] Emergency Department Provider Note ____________________________________________   Event Date/Time   First MD Initiated Contact with Patient 06/07/20 [phone]     (approximate)  I have reviewed the triage vital signs and the nursing notes.  HISTORY  Chief Complaint Hematuria   HPI Cory Campbell is a 40 y.o. malewho presents to the ED for evaluation of hematuria and dysuria.  Chart review indicates obesity, HIV on Biktarvy, HTN, HLD and DM.  Asthma.  History of BPH who has [facility] in the past, 09/2019.  Patient just to the ED for evaluation of 3 days of hematuria.  Reports dysuria and pain with urination, but denies abdominal pain or bleeding outside of his urination.  Denies stool changes, fever, emesis.  Reports tolerating p.o. intake and toileting at his baseline.  Reports he has had UTIs in the past and this feels similar.  Denies fever or flank pain.  Past Medical History:  Diagnosis Date  . Abrasion or friction burn of foot and toe(s), without mention of infection   . Acid reflux   . Adopted   . Allergic rhinitis, cause unspecified   . Anxiety   . Arthritis   . Arthrogryposis   . Asthma   . Bladder wall thickening   . BPH (benign prostatic hyperplasia)   . Chronic pain syndrome   . Depressive disorder, not elsewhere classified   . Diabetes mellitus without complication (HCC)   . Diverticulitis of colon (without mention of hemorrhage)(562.11)   . ED (erectile dysfunction)   . Herpes genitalis   . History of echocardiogram    a. 10/2016: EF 50-55%, normal wall motion  . History of stress test    a. 06/2011: significant GI uptake artifact, no evidence of ischemia, EF 56%  . HIV infection (HCC)   . HTN (hypertension)   . Hyperlipidemia   . Hypogonadism in male   . Myalgia and myositis, unspecified   . OAB (overactive bladder)   . Obesity   . Other psoriasis   . Premature baby    born premature  . Seizure disorder (HCC)    . Sleep apnea   . 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 or other cause, without mention of thyrotoxic crisis or storm    hyperthyroidism  . TIA (transient ischemic attack)   . Type II or unspecified type diabetes mellitus with neurological manifestations, not stated as uncontrolled(250.60)     Patient Active Problem List   Diagnosis Date Noted  . Acute asthma exacerbation 03/30/2020  . Acute respiratory failure with hypoxia (HCC) 03/29/2020  . HIV infection (HCC)   . Surgical wound, non healing   . Acute colitis 05/20/2017  . Colitis   . Acute renal failure (HCC) 05/15/2017  . Diarrhea 05/15/2017  . Sepsis (HCC) 05/15/2017  . Abdominal pain 11/25/2016  . HIV (human immunodeficiency virus infection) (HCC) 11/19/2016  . Hypotension 10/08/2016  . Unstable angina (HCC) 10/06/2016  . Shortness of breath 06/23/2015  . Obesity 05/18/2014  . Frequent falls 08/12/2013  . Pain of left clavicle 08/12/2013  . Tachycardia 01/21/2013  . Chronic pain syndrome 08/12/2012  . Obstructive sleep apnea on CPAP 06/07/2012  . Chest pain 06/20/2011  . Hypertension associated with diabetes (HCC) 06/20/2011  . Type 2 diabetes mellitus with complication, without long-term current use of insulin (HCC) 06/20/2011  . Diabetes mellitus (HCC) 06/20/2011  . Asthma, chronic, unspecified asthma severity, with acute exacerbation 01/18/2008  . Hyperlipidemia associated with type 2 diabetes mellitus (HCC)  08/28/2006  . Essential hypertension 06/27/2005    Past Surgical History:  Procedure Laterality Date  . APPENDECTOMY    . COLOSTOMY    . CORNEAL TRANSPLANT    . feet surgery     both  . HAND SURGERY     left and right  . leg surgery     left and right  . ORTHOPEDIC SURGERY     hands, feet, knees, legs  . tubes in ears     both    Prior to Admission medications   Medication Sig Start Date End Date Taking? Authorizing Provider  nitrofurantoin, macrocrystal-monohydrate, (MACROBID) 100 MG  capsule Take 1 capsule (100 mg total) by mouth 2 (two) times daily for 5 days. 06/07/20 06/12/20 Yes Delton Prairie, MD  acyclovir (ZOVIRAX) 400 MG tablet Take 400 mg by mouth 2 (two) times daily.    [provider]  albuterol (PROVENTIL HFA;VENTOLIN HFA) 108 (90 BASE) MCG/ACT inhaler Inhale 2 puffs into the lungs every 4 (four) hours as needed for wheezing or shortness of breath.     [provider]  albuterol (PROVENTIL) (2.5 MG/3ML) 0.083% nebulizer solution Take 2.5 mg by nebulization every 4 (four) hours as needed for wheezing or shortness of breath.    [provider]  alfuzosin (UROXATRAL) 10 MG 24 hr tablet Take 10 mg by mouth daily.    [provider]  amLODipine (NORVASC) 10 MG tablet Take 10 mg by mouth daily.    [provider]  aspirin EC 81 MG tablet Take 81 mg by mouth daily. Swallow whole.    [provider]  benzonatate (TESSALON PERLES) 100 MG capsule Take 1 capsule (100 mg total) by mouth every 6 (six) hours as needed. 04/15/20 04/15/21  Cuthriell, Christiane Ha D, PA-C  BIKTARVY 50-200-25 MG TABS tablet Take 1 tablet by mouth daily. 11/21/16   [provider]  brompheniramine-pseudoephedrine-DM 30-2-10 MG/5ML syrup Take 10 mLs by mouth 4 (four) times daily as needed. 04/15/20   Cuthriell, Delorise Royals, PA-C  buprenorphine (BUTRANS) 7.5 MCG/HR Place 7.5 mg onto the skin once a week. Saturday    [provider]  buPROPion (WELLBUTRIN XL) 150 MG 24 hr tablet Take 1 tablet by mouth daily. 11/16/16   [provider]  cetirizine (ZYRTEC) 10 MG tablet Take 10 mg by mouth daily.    [provider]  chlorthalidone (HYGROTON) 25 MG tablet Take 25 mg by mouth daily.    [provider]  citalopram (CELEXA) 20 MG tablet Take 20 mg by mouth daily.    [provider]  cloNIDine (CATAPRES - DOSED IN MG/24 HR) 0.2 mg/24hr patch Place 0.2 mg onto the skin once a week.    [provider]  diclofenac  sodium (VOLTAREN) 1 % GEL Apply 2-4 g topically 4 (four) times daily as needed. For pain.    [provider]  famotidine (PEPCID) 20 MG tablet Take 20 mg by mouth daily.  01/03/16   [provider]  feeding supplement, GLUCERNA SHAKE, (GLUCERNA SHAKE) LIQD Take 237 mLs by mouth 4 (four) times daily.    [provider]  ferrous sulfate 325 (65 FE) MG tablet Take 325 mg by mouth 3 (three) times daily.    [provider]  finasteride (PROSCAR) 5 MG tablet Take 5 mg by mouth daily.    [provider]  fluticasone (FLONASE) 50 MCG/ACT nasal spray Place 2 sprays into both nostrils daily.    [provider]  fluticasone (FLOVENT  HFA) 110 MCG/ACT inhaler Inhale 2 puffs into the lungs 2 (two) times daily.    [provider]  gabapentin (NEURONTIN) 300 MG capsule Take 300 mg by mouth at bedtime.    [provider]  glucose 4 GM chewable tablet Chew 1 tablet by mouth as needed for low blood sugar.    [provider]  ibuprofen (ADVIL,MOTRIN) 600 MG tablet Take 1 tablet (600 mg total) by mouth every 6 (six) hours as needed. 05/05/18   Domenick Gong, MD  insulin degludec (TRESIBA) 100 UNIT/ML FlexTouch Pen Inject 100 Units into the skin 2 (two) times daily.    [provider]  JARDIANCE 10 MG TABS tablet Take 10 mg by mouth daily. 01/19/20   [provider]  ketoconazole (NIZORAL) 2 % shampoo Apply 1 application topically 2 (two) times a week. tues and thursday 08/27/14   [provider]  losartan (COZAAR) 50 MG tablet Take 1 tablet (50 mg total) by mouth daily for 7 days. 05/26/20 06/02/20  Marisue Ivan D, PA-C  magnesium oxide (MAG-OX) 400 (241.3 Mg) MG tablet Take 1 tablet (400 mg total) by mouth daily. 05/23/17   Salary, Jetty Duhamel D, MD  Melatonin 5 MG TABS Take 10 mg by mouth at bedtime as needed (sleep).    [provider]  metFORMIN (GLUCOPHAGE-XR) 500 MG 24 hr tablet Take 1,000 mg by  mouth 2 (two) times daily.    [provider]  metoprolol tartrate (LOPRESSOR) 100 MG tablet TAKE 1 TABLET(100 MG) BY MOUTH TWICE DAILY 11/05/19   Antonieta Iba, MD  mirtazapine (REMERON) 45 MG tablet Take 45 mg by mouth at bedtime.    [provider]  Multiple Vitamin (MULTIVITAMIN) tablet Take 1 tablet by mouth daily.    [provider]  nortriptyline (PAMELOR) 25 MG capsule Take 50 mg by mouth at bedtime.    [provider]  NOVOLOG FLEXPEN 100 UNIT/ML FlexPen Inject 40 Units into the skin 3 (three) times daily with meals. With every meal and every snack 12/13/15   [provider]  nystatin-triamcinolone (MYCOLOG II) cream Apply 1 application topically 2 (two) times daily.    [provider]  omega-3 acid ethyl esters (LOVAZA) 1 g capsule Take 1 g by mouth 3 (three) times daily.    [provider]  omeprazole (PRILOSEC) 20 MG capsule Take 20 mg by mouth daily.    [provider]  ondansetron (ZOFRAN-ODT) 4 MG disintegrating tablet Take 4 mg by mouth every 8 (eight) hours as needed for nausea or vomiting.    [provider]  polyethylene glycol (MIRALAX / GLYCOLAX) 17 g packet Take 17 g by mouth 3 (three) times daily.    [provider]  potassium chloride SA (K-DUR,KLOR-CON) 20 MEQ tablet take 1 tablet by mouth once daily if needed 02/08/16   Antonieta Iba, MD  prednisoLONE acetate (PRED FORTE) 1 % ophthalmic suspension Place 1 drop into the right eye daily.     [provider]  predniSONE (DELTASONE) 50 MG tablet Take 1 tablet (50 mg total) by mouth daily with breakfast. 04/15/20   Cuthriell, Delorise Royals, PA-C  rosuvastatin (CRESTOR) 40 MG tablet TAKE 1 TABLET(40 MG) BY MOUTH DAILY 04/30/19   Gollan, Tollie Pizza, MD  sacubitril-valsartan (ENTRESTO) 49-51 MG Take 1 tablet by mouth 2 (two) times daily. Start day AFTER complete Losartan for 1 week 06/03/20   Marisue Ivan D, PA-C  solifenacin  (VESICARE) 10 MG tablet Take 10 mg  by mouth daily.    [provider]  Spacer/Aero-Holding Chambers (AEROCHAMBER PLUS) inhaler Use as instructed 03/23/18   Domenick Gong, MD  spironolactone (ALDACTONE) 25 MG tablet Take 50 mg by mouth 2 (two) times daily.    [provider]  tiZANidine (ZANAFLEX) 4 MG capsule Take 1 capsule (4 mg total) by mouth 3 (three) times daily as needed for muscle spasms. 04/14/19   Triplett, Cari B, FNP  TRULICITY 1.5 MG/0.5ML SOPN Inject 1.5 mg into the skin once a week. 01/14/20   [provider]    Allergies Penicillins and Erythromycin base  Family History  Adopted: Yes  Family history unknown: Yes    Social History Social History   Tobacco Use  . Smoking status: Former Smoker    Years: 0.00    Types: Pipe    Quit date: 01/2018    Years since quitting: 2.4  . Smokeless tobacco: Never Used  Vaping Use  . Vaping Use: Some days  . Substances: Nicotine, Flavoring  Substance Use Topics  . Alcohol use: Not Currently    Alcohol/week: 0.0 standard drinks    Comment: used to be moderate  . Drug use: No   Review of Systems  Constitutional: No fever/chills Eyes: No visual changes. ENT: No sore throat. Cardiovascular: Denies chest pain. Respiratory: Denies shortness of breath. Gastrointestinal: No abdominal pain.  No nausea, no vomiting.  No diarrhea.  No constipation. Genitourinary: Positive hematuria and dysuria Musculoskeletal: Negative for back pain. Skin: Negative for rash. Neurological: Negative for headaches, focal weakness or numbness. ____________________________________________  PHYSICAL EXAM:  VITAL SIGNS: Vitals:   06/07/20 0642 06/07/20 0830  BP: (!) 167/121 (!) 142/93  Pulse: 97 96  Temp: 98.1 F (36.7 C)   SpO2: 98% 95%     Constitutional: Alert and oriented. Well appearing and in no acute distress. Eyes: Conjunctivae are normal. PERRL. EOMI. Head: Atraumatic. Nose: No  congestion/rhinnorhea. Mouth/Throat: Mucous membranes are moist.  Oropharynx non-erythematous. Neck: No stridor. No cervical spine tenderness to palpation. Cardiovascular: Normal rate, regular rhythm. Grossly normal heart sounds.  Good peripheral circulation. Respiratory: Normal respiratory effort.  No retractions. Lungs CTAB. Gastrointestinal: Soft , nondistended, nontender to palpation. No CVA tenderness. Well-healed surgical incisions.  Benign abdomen throughout. Chaperoned GU exam with normal-appearing uncircumcised male genitalia.  No scrotal/testicular tenderness, swelling, rashes or skin changes.  No rashes, lesions or ulcerations to glans penis or remainder of genitalia.  No inguinal lymphadenopathy or tenderness Musculoskeletal: Bilateral lower leg bracing.  No joint effusions. No signs of acute trauma. Neurologic:  Normal speech and language. No gross focal neurologic deficits are appreciated. No gait instability noted. Skin:  Skin is warm, dry and intact. No rash noted. Psychiatric: Mood and affect are normal. Speech and behavior are normal. ____________________________________________   LABS (all labs ordered are listed, but only abnormal results are displayed)  Labs Reviewed  URINALYSIS, COMPLETE (UACMP) WITH MICROSCOPIC - Abnormal; Notable for the following components:      Result Value   Color, Urine YELLOW (*)    APPearance CLEAR (*)    Glucose, UA >=500 (*)    Hgb urine dipstick LARGE (*)    Protein, ur 100 (*)    RBC / HPF >50 (*)    All other components within normal limits  BASIC METABOLIC PANEL - Abnormal; Notable for the following components:   Glucose, Bld 110 (*)    All other components within normal limits  URINE CULTURE  CBC WITH DIFFERENTIAL/PLATELET   ____________________________________________  RADIOLOGY  ED MD interpretation: CT renal study reviewed by me without urologic obstruction, mild bladder wall thickening noted  Official radiology  report(s): CT Renal Stone Study  Result Date: 06/07/2020 CLINICAL DATA:  Hematuria, dysuria EXAM: CT ABDOMEN AND PELVIS WITHOUT CONTRAST TECHNIQUE: Multidetector CT imaging of the abdomen and pelvis was performed following the standard protocol without IV contrast. COMPARISON:  01/08/2020 FINDINGS: Lower chest: Lung bases are clear. Hepatobiliary: Mild hepatic steatosis. Layering small gallstones (series 2/image 27), without associated inflammatory changes. No intrahepatic or extrahepatic ductal dilatation. Pancreas: Within normal limits. Spleen: Within normal limits. Adrenals/Urinary Tract: Adrenal glands are within normal limits. Kidneys are within normal limits. No renal, ureteral, or bladder calculi. No hydronephrosis. Mildly thick-walled bladder, although underdistended. Stomach/Bowel: Stomach is within normal limits. No evidence of bowel obstruction. Appendix is not discretely visualized. Prior left lower quadrant ostomy status post reversal. No colonic wall thickening or inflammatory changes. Vascular/Lymphatic: No evidence of abdominal aortic aneurysm. No suspicious abdominopelvic lymphadenopathy. Reproductive: Prostate is unremarkable. Other: No abdominopelvic ascites. 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 Subcutaneous injection sites along the right mid anterior abdominal wall. IMPRESSION: Mildly thick-walled bladder, although underdistended. No CT findings to account for the patient's hematuria. Specifically, no renal calculi or hydronephrosis. Cholelithiasis, without associated inflammatory changes. Additional stable ancillary findings as above. Electronically Signed   By: Charline Bills M.D.   On: 06/07/2020 08:18    ____________________________________________   PROCEDURES and INTERVENTIONS  Procedure(s) performed (including  Critical Care):  .1-3 Lead EKG Interpretation Performed by: Delton Prairie, MD Authorized by: Delton Prairie, MD     Interpretation: normal     ECG rate:  90   ECG rate assessment: normal     Rhythm: sinus rhythm     Ectopy: none     Conduction: normal      Medications  nitrofurantoin (macrocrystal-monohydrate) (MACROBID) capsule 100 mg (100 mg Oral Given 06/07/20 0819)    ____________________________________________   MDM / ED COURSE   40 year old male with copious past medical history presents to the ED with dysuria and hematuria concerning for acute cystitis.  No fevers or flank pain to suggest pyelonephritis.  Urinalysis with gross microscopic hematuria with mucus and a few whites, is concerning for acute cystitis considering his symptoms.  We will send for culture and start on a short course of Macrobid.  Blood work otherwise unremarkable.  Renal function intact and no leukocytosis.  Due to his history of nephrolithiasis and intra-abdominal surgeries, we will obtain noncontrast CT scan to assess for urologic obstruction.   CT without urologic obstruction, but does demonstrate some bladder wall thickening further suggestive of acute cystitis.  We will discharge with Macrobid, culture his urine and have him follow-up closely with urology as an outpatient.  Return precautions for the ED were discussed.  Clinical Course as of 06/07/20 1233  Mon Jun 07, 2020  [id number] Reassessed.  Continues to look well.  We discussed acute cystitis, Macrobid and following up with urology.  We discussed return precautions for the ED. [DS]    Clinical Course User Index [DS] Delton Prairie, MD    ____________________________________________   FINAL CLINICAL IMPRESSION(S) / ED DIAGNOSES  Final diagnoses:  Gross hematuria  Cystitis     ED Discharge Orders         Ordered    nitrofurantoin, macrocrystal-monohydrate, (MACROBID) 100 MG capsule  2 times daily  06/07/20 0736           Delton Prairie   Note:  This document was prepared using Dragon voice recognition software and may include unintentional dictation errors.   Delton Prairie, MD 06/07/20 [phone]

## 2020-06-07 NOTE — ED Triage Notes (Signed)
Pt brought in by EMS from home. Pt c/o pain with urination and blood in urine since Thursday. Pt states he also thinks he has a cold.

## 2020-06-07 NOTE — ED Notes (Signed)
Patient transported to CT 

## 2020-06-08 LAB — URINE CULTURE: Culture: <10000 — AB

## 2020-06-14 ENCOUNTER — Other Ambulatory Visit: Payer: Self-pay

## 2020-06-14 ENCOUNTER — Emergency Department: Payer: Medicaid Other

## 2020-06-14 ENCOUNTER — Emergency Department
Admission: EM | Admit: 2020-06-14 | Discharge: 2020-06-14 | Disposition: A | Discharge status: Home / Self Care | Payer: Medicaid Other | Attending: Emergency Medicine | Admitting: Emergency Medicine

## 2020-06-14 DIAGNOSIS — Z87891 Personal history of nicotine dependence: Secondary | ICD-10-CM | POA: Diagnosis not present

## 2020-06-14 DIAGNOSIS — Z794 Long term (current) use of insulin: Secondary | ICD-10-CM | POA: Diagnosis not present

## 2020-06-14 DIAGNOSIS — E1149 Type 2 diabetes mellitus with other diabetic neurological complication: Secondary | ICD-10-CM | POA: Insufficient documentation

## 2020-06-14 DIAGNOSIS — B20 Human immunodeficiency virus [HIV] disease: Secondary | ICD-10-CM | POA: Insufficient documentation

## 2020-06-14 DIAGNOSIS — I10 Essential (primary) hypertension: Secondary | ICD-10-CM | POA: Diagnosis not present

## 2020-06-14 DIAGNOSIS — Z7982 Long term (current) use of aspirin: Secondary | ICD-10-CM | POA: Insufficient documentation

## 2020-06-14 DIAGNOSIS — Z79899 Other long term (current) drug therapy: Secondary | ICD-10-CM | POA: Insufficient documentation

## 2020-06-14 DIAGNOSIS — E785 Hyperlipidemia, unspecified: Secondary | ICD-10-CM | POA: Insufficient documentation

## 2020-06-14 DIAGNOSIS — Z7951 Long term (current) use of inhaled steroids: Secondary | ICD-10-CM | POA: Diagnosis not present

## 2020-06-14 DIAGNOSIS — E1169 Type 2 diabetes mellitus with other specified complication: Secondary | ICD-10-CM | POA: Insufficient documentation

## 2020-06-14 DIAGNOSIS — Z7984 Long term (current) use of oral hypoglycemic drugs: Secondary | ICD-10-CM | POA: Insufficient documentation

## 2020-06-14 DIAGNOSIS — R030 Elevated blood-pressure reading, without diagnosis of hypertension: Secondary | ICD-10-CM | POA: Diagnosis present

## 2020-06-14 DIAGNOSIS — J45901 Unspecified asthma with (acute) exacerbation: Secondary | ICD-10-CM | POA: Diagnosis not present

## 2020-06-14 LAB — URINALYSIS, COMPLETE (UACMP) WITH MICROSCOPIC
Bacteria, UA: NONE SEEN
Bilirubin Urine: NEGATIVE
Glucose, UA: >=500 mg/dL — AB
Hgb urine dipstick: NEGATIVE
Ketones, ur: 5 mg/dL — AB
Leukocytes,Ua: NEGATIVE
Nitrite: NEGATIVE
Protein, ur: 30 mg/dL — AB
Specific Gravity, Urine: 1.03 (ref 1.005–1.030)
pH: 5 (ref 5.0–8.0)

## 2020-06-14 LAB — BASIC METABOLIC PANEL
Anion gap: 9 (ref 5–15)
BUN: 15 mg/dL (ref 6–20)
CO2: 24 mmol/L (ref 22–32)
Calcium: 9 mg/dL (ref 8.9–10.3)
Chloride: 104 mmol/L (ref 98–111)
Creatinine, Ser: 0.94 mg/dL (ref 0.61–1.24)
GFR, Estimated: >60 mL/min (ref >60)
Glucose, Bld: 284 mg/dL — ABNORMAL HIGH (ref 70–99)
Potassium: 3.9 mmol/L (ref 3.5–5.1)
Sodium: 137 mmol/L (ref 135–145)

## 2020-06-14 LAB — CBC WITH DIFFERENTIAL/PLATELET
Abs Immature Granulocytes: 0.01 10*3/uL (ref 0.00–0.07)
Basophils Absolute: 0 10*3/uL (ref 0.0–0.1)
Basophils Relative: 0 %
Eosinophils Absolute: 0 10*3/uL (ref 0.0–0.5)
Eosinophils Relative: 1 %
HCT: 46.7 % (ref 39.0–52.0)
Hemoglobin: 15.3 g/dL (ref 13.0–17.0)
Immature Granulocytes: 0 %
Lymphocytes Relative: 26 %
Lymphs Abs: 1.5 10*3/uL (ref 0.7–4.0)
MCH: 28.4 pg (ref 26.0–34.0)
MCHC: 32.8 g/dL (ref 30.0–36.0)
MCV: 86.8 fL (ref 80.0–100.0)
Monocytes Absolute: 0.4 10*3/uL (ref 0.1–1.0)
Monocytes Relative: 6 %
Neutro Abs: 4.1 10*3/uL (ref 1.7–7.7)
Neutrophils Relative %: 67 %
Platelets: 210 10*3/uL (ref 150–400)
RBC: 5.38 MIL/uL (ref 4.22–5.81)
RDW: 14.2 % (ref 11.5–15.5)
WBC: 6 10*3/uL (ref 4.0–10.5)
nRBC: 0 % (ref 0.0–0.2)

## 2020-06-14 LAB — TROPONIN I (HIGH SENSITIVITY)
Troponin I (High Sensitivity): 19 ng/L — ABNORMAL HIGH (ref <18)
Troponin I (High Sensitivity): 19 ng/L — ABNORMAL HIGH (ref <18)

## 2020-06-14 MED ORDER — METOPROLOL TARTRATE 5 MG/5ML IV SOLN
5.0000 mg | Freq: Once | INTRAVENOUS | Status: AC
  Administered 2020-06-14: 5 mg via INTRAVENOUS
  Filled 2020-06-14: qty 5

## 2020-06-14 MED ORDER — ACETAMINOPHEN 500 MG PO TABS
1000.0000 mg | ORAL_TABLET | Freq: Once | ORAL | Status: AC
  Administered 2020-06-14: 1000 mg via ORAL
  Filled 2020-06-14: qty 2

## 2020-06-14 NOTE — Discharge Instructions (Addendum)
Please seek medical attention for any high fevers, chest pain, shortness of breath, change in behavior, persistent vomiting, bloody stool or any other new or concerning symptoms.  

## 2020-06-14 NOTE — ED Notes (Signed)
Patient is resting comfortably. Call light in reach. Fall precautions in place. Patient is awaiting lab results and improvement in BP post medication intervention.

## 2020-06-14 NOTE — ED Triage Notes (Signed)
Pt reports being out of blood pressure medicine, unable to fill it at the pharmacy. Blood pressure 181/118 for EMS in route to ED. Pt also c/o headache x3 days. Denies visual changes.

## 2020-06-14 NOTE — ED Provider Notes (Signed)
[facility] Emergency Department Provider Note   ____________________________________________   I have reviewed the triage vital signs and the nursing notes.   HISTORY  Chief Complaint Hypertension   History limited by: Not Limited   HPI Cory Campbell is a 40 y.o. male who presents to the emergency department today because of concern for high blood pressure. The patient states that his doctor is switching which blood pressure medication that he uses. He says that he went to pick up his new prescription however it is not currently at his pharmacy. He has noticed a frontal headache over the past three days and developed some nausea today. Other members of his household have been sick recently although they have all tested negative for covid. The patient denise any chest pain.    Records reviewed. Per medical record review patient has a history of HTN, DM.   Past Medical History:  Diagnosis Date  . Abrasion or friction burn of foot and toe(s), without mention of infection   . Acid reflux   . Adopted   . Allergic rhinitis, cause unspecified   . Anxiety   . Arthritis   . Arthrogryposis   . Asthma   . Bladder wall thickening   . BPH (benign prostatic hyperplasia)   . Chronic pain syndrome   . Depressive disorder, not elsewhere classified   . Diabetes mellitus without complication (HCC)   . Diverticulitis of colon (without mention of hemorrhage)(562.11)   . ED (erectile dysfunction)   . Herpes genitalis   . History of echocardiogram    a. 10/2016: EF 50-55%, normal wall motion  . History of stress test    a. 06/2011: significant GI uptake artifact, no evidence of ischemia, EF 56%  . HIV infection (HCC)   . HTN (hypertension)   . Hyperlipidemia   . Hypogonadism in male   . Myalgia and myositis, unspecified   . OAB (overactive bladder)   . Obesity   . Other psoriasis   . Premature baby    born premature  . Seizure disorder (HCC)   . Sleep apnea    . 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 or other cause, without mention of thyrotoxic crisis or storm    hyperthyroidism  . TIA (transient ischemic attack)   . Type II or unspecified type diabetes mellitus with neurological manifestations, not stated as uncontrolled(250.60)     Patient Active Problem List   Diagnosis Date Noted  . Acute asthma exacerbation 03/30/2020  . Acute respiratory failure with hypoxia (HCC) 03/29/2020  . HIV infection (HCC)   . Surgical wound, non healing   . Acute colitis 05/20/2017  . Colitis   . Acute renal failure (HCC) 05/15/2017  . Diarrhea 05/15/2017  . Sepsis (HCC) 05/15/2017  . Abdominal pain 11/25/2016  . HIV (human immunodeficiency virus infection) (HCC) 11/19/2016  . Hypotension 10/08/2016  . Unstable angina (HCC) 10/06/2016  . Shortness of breath 06/23/2015  . Obesity 05/18/2014  . Frequent falls 08/12/2013  . Pain of left clavicle 08/12/2013  . Tachycardia 01/21/2013  . Chronic pain syndrome 08/12/2012  . Obstructive sleep apnea on CPAP 06/07/2012  . Chest pain 06/20/2011  . Hypertension associated with diabetes (HCC) 06/20/2011  . Type 2 diabetes mellitus with complication, without long-term current use of insulin (HCC) 06/20/2011  . Diabetes mellitus (HCC) 06/20/2011  . Asthma, chronic, unspecified asthma severity, with acute exacerbation 01/18/2008  . Hyperlipidemia associated with type 2 diabetes mellitus (HCC) 08/28/2006  . Essential hypertension 06/27/2005  Past Surgical History:  Procedure Laterality Date  . APPENDECTOMY    . COLOSTOMY    . CORNEAL TRANSPLANT    . feet surgery     both  . HAND SURGERY     left and right  . leg surgery     left and right  . ORTHOPEDIC SURGERY     hands, feet, knees, legs  . tubes in ears     both    Prior to Admission medications   Medication Sig Start Date End Date Taking? Authorizing Provider  acyclovir (ZOVIRAX) 400 MG tablet Take 400 mg by mouth 2 (two) times daily.     [provider]  albuterol (PROVENTIL HFA;VENTOLIN HFA) 108 (90 BASE) MCG/ACT inhaler Inhale 2 puffs into the lungs every 4 (four) hours as needed for wheezing or shortness of breath.     [provider]  albuterol (PROVENTIL) (2.5 MG/3ML) 0.083% nebulizer solution Take 2.5 mg by nebulization every 4 (four) hours as needed for wheezing or shortness of breath.    [provider]  alfuzosin (UROXATRAL) 10 MG 24 hr tablet Take 10 mg by mouth daily.    [provider]  amLODipine (NORVASC) 10 MG tablet Take 10 mg by mouth daily.    [provider]  aspirin EC 81 MG tablet Take 81 mg by mouth daily. Swallow whole.    [provider]  benzonatate (TESSALON PERLES) 100 MG capsule Take 1 capsule (100 mg total) by mouth every 6 (six) hours as needed. 04/15/20 04/15/21  Cuthriell, Christiane Ha D, PA-C  BIKTARVY 50-200-25 MG TABS tablet Take 1 tablet by mouth daily. 11/21/16   [provider]  brompheniramine-pseudoephedrine-DM 30-2-10 MG/5ML syrup Take 10 mLs by mouth 4 (four) times daily as needed. 04/15/20   Cuthriell, Delorise Royals, PA-C  buprenorphine (BUTRANS) 7.5 MCG/HR Place 7.5 mg onto the skin once a week. Saturday    [provider]  buPROPion (WELLBUTRIN XL) 150 MG 24 hr tablet Take 1 tablet by mouth daily. 11/16/16   [provider]  cetirizine (ZYRTEC) 10 MG tablet Take 10 mg by mouth daily.    [provider]  chlorthalidone (HYGROTON) 25 MG tablet Take 25 mg by mouth daily.    [provider]  citalopram (CELEXA) 20 MG tablet Take 20 mg by mouth daily.    [provider]  cloNIDine (CATAPRES - DOSED IN MG/24 HR) 0.2 mg/24hr patch Place 0.2 mg onto the skin once a week.    [provider]  diclofenac sodium (VOLTAREN) 1 % GEL Apply 2-4 g topically 4 (four) times daily as needed. For pain.    [provider]  famotidine (PEPCID) 20 MG tablet Take 20 mg by mouth daily.  01/03/16    [provider]  feeding supplement, GLUCERNA SHAKE, (GLUCERNA SHAKE) LIQD Take 237 mLs by mouth 4 (four) times daily.    [provider]  ferrous sulfate 325 (65 FE) MG tablet Take 325 mg by mouth 3 (three) times daily.    [provider]  finasteride (PROSCAR) 5 MG tablet Take 5 mg by mouth daily.    [provider]  fluticasone (FLONASE) 50 MCG/ACT nasal spray Place 2 sprays into both nostrils daily.    [provider]  fluticasone (FLOVENT HFA) 110 MCG/ACT inhaler Inhale 2 puffs into the lungs 2 (two) times daily.    [provider]  gabapentin (NEURONTIN) 300 MG capsule Take 300 mg by mouth at bedtime.    [provider]  glucose 4 GM chewable tablet Chew 1 tablet by mouth as needed for low blood sugar.    [provider]  ibuprofen (ADVIL,MOTRIN) 600 MG tablet Take 1 tablet (600 mg total) by mouth every 6 (six) hours as needed. 05/05/18   Domenick Gong, MD  insulin degludec (TRESIBA) 100 UNIT/ML FlexTouch Pen Inject 100 Units into the skin 2 (two) times daily.    [provider]  JARDIANCE 10 MG TABS tablet Take 10 mg by mouth daily. 01/19/20   [provider]  ketoconazole (NIZORAL) 2 % shampoo Apply 1 application topically 2 (two) times a week. tues and thursday 08/27/14   [provider]  losartan (COZAAR) 50 MG tablet Take 1 tablet (50 mg total) by mouth daily for 7 days. 05/26/20 06/02/20  Marisue Ivan D, PA-C  magnesium oxide (MAG-OX) 400 (241.3 Mg) MG tablet Take 1 tablet (400 mg total) by mouth daily. 05/23/17   Salary, Jetty Duhamel D, MD  Melatonin 5 MG TABS Take 10 mg by mouth at bedtime as needed (sleep).    [provider]  metFORMIN (GLUCOPHAGE-XR) 500 MG 24 hr tablet Take 1,000 mg by mouth 2 (two) times daily.    [provider]  metoprolol tartrate (LOPRESSOR) 100 MG tablet TAKE 1 TABLET(100 MG) BY MOUTH TWICE DAILY 11/05/19   Antonieta Iba, MD  mirtazapine  (REMERON) 45 MG tablet Take 45 mg by mouth at bedtime.    [provider]  Multiple Vitamin (MULTIVITAMIN) tablet Take 1 tablet by mouth daily.    [provider]  nortriptyline (PAMELOR) 25 MG capsule Take 50 mg by mouth at bedtime.    [provider]  NOVOLOG FLEXPEN 100 UNIT/ML FlexPen Inject 40 Units into the skin 3 (three) times daily with meals. With every meal and every snack 12/13/15   [provider]  nystatin-triamcinolone (MYCOLOG II) cream Apply 1 application topically 2 (two) times daily.    [provider]  omega-3 acid ethyl esters (LOVAZA) 1 g capsule Take 1 g by mouth 3 (three) times daily.    [provider]  omeprazole (PRILOSEC) 20 MG capsule Take 20 mg by mouth daily.    [provider]  ondansetron (ZOFRAN-ODT) 4 MG disintegrating tablet Take 4 mg by mouth every 8 (eight) hours as needed for nausea or vomiting.    [provider]  polyethylene glycol (MIRALAX / GLYCOLAX) 17 g packet Take 17 g by mouth 3 (three) times daily.    [provider]  potassium chloride SA (K-DUR,KLOR-CON) 20 MEQ tablet take 1 tablet by mouth once daily if needed 02/08/16   Antonieta Iba, MD  prednisoLONE acetate (PRED FORTE) 1 % ophthalmic suspension Place 1 drop into the right eye daily.     [provider]  predniSONE (DELTASONE) 50 MG tablet Take 1 tablet (50 mg total) by mouth daily with breakfast. 04/15/20   Cuthriell, Delorise Royals, PA-C  rosuvastatin (CRESTOR) 40 MG tablet TAKE 1 TABLET(40 MG) BY MOUTH DAILY 04/30/19   Gollan, Tollie Pizza, MD  sacubitril-valsartan (ENTRESTO) 49-51 MG Take 1 tablet by mouth 2 (two) times daily. Start day AFTER complete Losartan for 1 week 06/03/20   Marisue Ivan D, PA-C  solifenacin (VESICARE) 10 MG tablet Take 10 mg by mouth daily.    [provider]  Spacer/Aero-Holding Chambers (AEROCHAMBER PLUS) inhaler Use as instructed 03/23/18   Domenick Gong, MD   spironolactone (ALDACTONE) 25 MG tablet Take 50 mg by mouth 2 (two)  times daily.    [provider]  tiZANidine (ZANAFLEX) 4 MG capsule Take 1 capsule (4 mg total) by mouth 3 (three) times daily as needed for muscle spasms. 04/14/19   Triplett, Cari B, FNP  TRULICITY 1.5 MG/0.5ML SOPN Inject 1.5 mg into the skin once a week. 01/14/20   [provider]    Allergies Penicillins and Erythromycin base  Family History  Adopted: Yes  Family history unknown: Yes    Social History Social History   Tobacco Use  . Smoking status: Former Smoker    Years: 0.00    Types: Pipe    Quit date: 01/2018    Years since quitting: 2.4  . Smokeless tobacco: Never Used  Vaping Use  . Vaping Use: Some days  . Substances: Nicotine, Flavoring  Substance Use Topics  . Alcohol use: Not Currently    Alcohol/week: 0.0 standard drinks    Comment: used to be moderate  . Drug use: No    Review of Systems Constitutional: No fever/chills Eyes: No visual changes. ENT: No sore throat. Cardiovascular: Denies chest pain. Respiratory: Denies shortness of breath. Gastrointestinal: No abdominal pain.  Positive for nausea.   Genitourinary: Negative for dysuria. Musculoskeletal: Negative for back pain. Skin: Negative for rash. Neurological: Positive for headache.  ____________________________________________   PHYSICAL EXAM:  VITAL SIGNS: ED Triage Vitals  Enc Vitals Group     BP 06/14/20 1648 (!) 200/131     Pulse Rate 06/14/20 1648 (!) 110     Resp 06/14/20 1648 20     Temp 06/14/20 1648 98.4 F (36.9 C)     Temp src --      SpO2 06/14/20 1648 99 %     Weight 06/14/20 1646 190 lb (86.2 kg)     Height 06/14/20 1646 5\' 6"  (1.676 m)     Head Circumference --      Peak Flow --      Pain Score 06/14/20 1646 9   Constitutional: Alert and oriented.  Eyes: Conjunctivae are normal.  ENT      Head: Normocephalic and atraumatic.      Nose: No congestion/rhinnorhea.      Mouth/Throat:  Mucous membranes are moist.      Neck: No stridor. Hematological/Lymphatic/Immunilogical: No cervical lymphadenopathy. Cardiovascular: Normal rate, regular rhythm.  No murmurs, rubs, or gallops.  Respiratory: Normal respiratory effort without tachypnea nor retractions. Breath sounds are clear and equal bilaterally. No wheezes/rales/rhonchi. Gastrointestinal: Soft and non tender. No rebound. No guarding.  Genitourinary: Deferred Musculoskeletal: Contractures of extremities.  Neurologic:  Normal speech and language. No gross focal neurologic deficits are appreciated.  Skin:  Skin is warm, dry and intact. No rash noted. Psychiatric: Mood and affect are normal. Speech and behavior are normal. Patient exhibits appropriate insight and judgment.  ____________________________________________    LABS (pertinent positives/negatives)  CBC wbc 6.0, hgb 15.3, plt 210 UA clear, negative nitrite, negative leukocytes, rbc and wbc 0-5 Trop hs 19 BMP wnl except glu 284 ____________________________________________   EKG  I, 08/14/20, attending physician, personally viewed and interpreted this EKG  EKG Time: 1651 Rate: 118 Rhythm: sinus tachycardia Axis: normal Intervals: qtc question slightly prolonged QRS: narrow ST changes: no st elevation Impression: abnormal ekg   ____________________________________________    RADIOLOGY  CT head No acute intracranial abnormality  ____________________________________________   PROCEDURES  Procedures  ____________________________________________   INITIAL IMPRESSION / ASSESSMENT AND PLAN / ED COURSE  Pertinent labs & imaging results that were available during my  care of the patient were reviewed by me and considered in my medical decision making (see chart for details).   Patient presented to the emergency department today because of concerns for high blood pressure and headache.  Patient's blood pressure was fine.  He was given  medication which helped decrease his blood pressure.  CT head did not show any acute bleed.  Blood work initially showed a very minimally elevated troponin however repeat without any concerning change.  Patient did feel better after blood pressure came down.  Discussed with patient importance of making sure he obtains his blood pressure medication from the pharmacy.  ____________________________________________   FINAL CLINICAL IMPRESSION(S) / ED DIAGNOSES  Final diagnoses:  Hypertension, unspecified type     Note: This dictation was prepared with Dragon dictation. Any transcriptional errors that result from this process are unintentional     Phineas Semen, MD 06/14/20 2338

## 2020-06-14 NOTE — ED Triage Notes (Signed)
Pt also reports that he was seen in the ED last week and diagnosed with bladder infection, started antibiotics. He states "I still have back pain so it might not be getting better".

## 2020-06-15 ENCOUNTER — Telehealth: Payer: Self-pay | Admitting: Physician Assistant

## 2020-06-15 NOTE — Telephone Encounter (Signed)
Started PA for [employer] through [employer] since patient is on medicaid.   Upon filling the form out it asks:   " Does the beneficiary have a diagnosis of chronic heart Failure (NYHA II - IV) with left ventricular ejection fraction (EF) less than or equal to 40%?"  The patient does not have documented chronic heart failure and his EF was 50-55% per his last ECHO in 2018.   I am unsure if this PA will be approved due to this information.   If you would like [place] to start working on patient assistance for the patient please let me or Aundra Millet, RN know.

## 2020-06-15 NOTE — Telephone Encounter (Signed)
Patient calling in stating the [facility] in [place] has made patient aware their needs to be a prior auth for his Sherryll Burger.

## 2020-06-16 NOTE — Telephone Encounter (Signed)
Please see JV note below.

## 2020-06-16 NOTE — Telephone Encounter (Signed)
I have completed the PA form and placed it on JV desk in the APP room to be signed.

## 2020-06-17 ENCOUNTER — Telehealth: Payer: Self-pay | Admitting: Physician Assistant

## 2020-06-17 ENCOUNTER — Telehealth: Payer: Self-pay

## 2020-06-17 NOTE — Telephone Encounter (Signed)
PA has been completed and signed and given to [place], [place].

## 2020-06-17 NOTE — Telephone Encounter (Signed)
PA has been faxed per other phone encounter.

## 2020-06-17 NOTE — Telephone Encounter (Signed)
Patient calling in asking for advice on what he should be doing or taking while waiting on Entresto prior auth. Patient reports he went to ED two days ago and is still complaining of headaches  Please advise

## 2020-06-17 NOTE — Telephone Encounter (Signed)
PA started for Entresto through [employer].  Form has been filled out and signed by JV.  Faxed to [phone]  Pharmacy PA call center number is [phone] to check the status.

## 2020-06-17 NOTE — Telephone Encounter (Signed)
Spoke to Cory Campbell. Prior Berkley Harvey has been sent today for [facility]. I notified Cory Campbell that in the meantime I have left him samples to cover him for 4 weeks at the front desk of Entresto 49/51mg  TWICE daily.  Notified Cory Campbell that in the chance that PA is not approved, I have also left Cory Campbell assistance application for Entresto at the front desk for him to sign when he picks up samples. Cory Campbell requires transportation Zenaida Niece so states he will be in either Tuesday or Wednesday of next week.  Cory Campbell appreciative and has no further questions or concerns.   Note: I have remainder of Cory Campbell assistance application at my desk. I will send completed application to [employer] after Cory Campbell signs his portion.

## 2020-06-21 ENCOUNTER — Other Ambulatory Visit: Payer: Self-pay

## 2020-06-23 ENCOUNTER — Other Ambulatory Visit: Payer: Self-pay | Admitting: Physician Assistant

## 2020-06-23 DIAGNOSIS — R252 Cramp and spasm: Secondary | ICD-10-CM

## 2020-06-24 NOTE — Telephone Encounter (Signed)
Patient came to office to sign forms.  Picked up samples and dropped off medicaid information .    Placed in nurse box.

## 2020-07-01 ENCOUNTER — Other Ambulatory Visit: Payer: Self-pay

## 2020-07-01 ENCOUNTER — Ambulatory Visit (INDEPENDENT_AMBULATORY_CARE_PROVIDER_SITE_OTHER): Payer: Medicaid Other

## 2020-07-01 DIAGNOSIS — R252 Cramp and spasm: Secondary | ICD-10-CM

## 2020-07-01 NOTE — Telephone Encounter (Signed)
Prior auth denied via letter received in office.  Faxed appeal application to [employer] for assistance to [phone].   Patient assistance application completed. Will have Leafy Kindle, PA sign tomorrow in clinic and fax to [employer].

## 2020-07-02 NOTE — Telephone Encounter (Addendum)
Am I missing his diagnosis somewhere? I see his echo from 2018 with an EF of 50-55% but that that on its own does not give the pt a diagnosis of CHF. I don't see elevated LV filling pressure, BNP, diastolic dysfunction or something else to qualify him as having diastolic CHF on the echo report, problem list, PMH, or in yours or Dr Windell Hummingbird note (plan lists Entresto under HTN diagnosis as well).

## 2020-07-02 NOTE — Telephone Encounter (Signed)
He does have CHF with EF 50-55%. Normal EF is 55-60%. Please resubmit.

## 2020-07-02 NOTE — Telephone Encounter (Addendum)
Entresto prior authorization was denied because pt does not have CHF and that is Entresto's only indication. It will not be approved for just HTN. Do not recommend pt assistance either as this is primarily for patients who are uninsured (they won't cover pt for an off-label use when insurance denies it).  Called [facility] pharmacy to confirm compliance given resistant HTN in setting of multiple medications being prescribed:  -Amlodipine 10mg  daily: not filled in 2 years since 07/04/2018 -Chlorthalidone 25mg  daily: not filled in 7 months since 11/27/2019 -Clonidine 0.2mg  patch: not filled in 9 months since 09/10/2019 -Metoprolol tartrate 100mg  BID: picked up today 5/27, last was 02/11/20 for 3 month supply (would have been out of med for about 1 month) -Spironolactone: our med list states 50mg  BID, pharmacy has 25mg  with rx to take 2 tabs daily. Last filled 05/31/20 for 180mg  tabs (90 day supply) -Lisinopril 40mg  daily (was prescribed leading up to 4/20 office visit when pt was changed to 1 wk of losartan then [facility] plan): last filled 5 months ago 01/23/20 for 30 day supply  Pt hasn't filled rx for amlodipine, chlorthalidone, clonidine, or lisinopril in the last 6+ months. Recommend resume a few of these meds for and emphasizing need for medication compliance with pt. Do not recommend pursuing Entresto further since insurance will not cover for off-label indication.

## 2020-07-02 NOTE — Telephone Encounter (Signed)
Completed patient assistance form for [facility] faxed to [employer] @ [phone]. Placed in [employer]

## 2020-07-03 ENCOUNTER — Other Ambulatory Visit: Payer: Self-pay | Admitting: Cardiovascular Disease

## 2020-07-06 NOTE — Telephone Encounter (Signed)
Patient assistance for [facility] approved.  Approval dates 07/06/2020 through 07/06/2020.

## 2020-07-06 NOTE — Telephone Encounter (Signed)
Called and spoke to pt, notified that PA for Sherryll Burger was approved and he should be hearing from [employer] as well regarding shipment. Gave pt [employer] phone number to follow up with any shipment, refill assistance.  Pt voiced understanding and appreciative of information. No further questions at this time.

## 2020-07-07 ENCOUNTER — Other Ambulatory Visit: Payer: Self-pay

## 2020-07-07 ENCOUNTER — Encounter: Payer: Self-pay | Admitting: Physician Assistant

## 2020-07-07 ENCOUNTER — Ambulatory Visit (INDEPENDENT_AMBULATORY_CARE_PROVIDER_SITE_OTHER): Payer: Medicaid Other | Admitting: Physician Assistant

## 2020-07-07 VITALS — BP 130/70 | HR 91 | Ht 66.0 in | Wt 195.0 lb

## 2020-07-07 DIAGNOSIS — I152 Hypertension secondary to endocrine disorders: Secondary | ICD-10-CM | POA: Diagnosis not present

## 2020-07-07 DIAGNOSIS — R079 Chest pain, unspecified: Secondary | ICD-10-CM

## 2020-07-07 DIAGNOSIS — E118 Type 2 diabetes mellitus with unspecified complications: Secondary | ICD-10-CM

## 2020-07-07 DIAGNOSIS — I110 Hypertensive heart disease with heart failure: Secondary | ICD-10-CM | POA: Diagnosis not present

## 2020-07-07 DIAGNOSIS — R000 Tachycardia, unspecified: Secondary | ICD-10-CM

## 2020-07-07 DIAGNOSIS — G8929 Other chronic pain: Secondary | ICD-10-CM

## 2020-07-07 DIAGNOSIS — I10 Essential (primary) hypertension: Secondary | ICD-10-CM | POA: Diagnosis not present

## 2020-07-07 DIAGNOSIS — E1159 Type 2 diabetes mellitus with other circulatory complications: Secondary | ICD-10-CM

## 2020-07-07 DIAGNOSIS — G4733 Obstructive sleep apnea (adult) (pediatric): Secondary | ICD-10-CM

## 2020-07-07 DIAGNOSIS — Z9989 Dependence on other enabling machines and devices: Secondary | ICD-10-CM

## 2020-07-07 NOTE — Progress Notes (Addendum)
Office Visit    Patient Name: Cory Campbell Date of Encounter: 07/07/2020  PCP:  Cory Campbell, Ngwe A, MD   [facility]  Cardiologist:  Dr. Mariah MillingGollan Advanced Practice Provider:  No care team member to display Electrophysiologist:  None   Chief Complaint    Chief Complaint  Patient presents with  . Other    1 month follow up. Meds reviewed verbally with patient.     [age] y.o. 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  Past Medical History    Past Medical History:  Diagnosis Date  . (HFpEF) heart failure with preserved ejection fraction (HCC)   . Abrasion or friction burn of foot and toe(s), without mention of infection   . Acid reflux   . Adopted   . Allergic rhinitis, cause unspecified   . Anxiety   . Arthritis   . Arthrogryposis   . Asthma   . Bladder wall thickening   . BPH (benign prostatic hyperplasia)   . Chronic pain syndrome   . Depressive disorder, not elsewhere classified   . Diabetes mellitus without complication (HCC)   . Diverticulitis of colon (without mention of hemorrhage)(562.11)   . ED (erectile dysfunction)   . Herpes genitalis   . History of echocardiogram    a. 10/2016: EF 50-55%, normal wall motion  . History of stress test    a. 06/2011: significant GI uptake artifact, no evidence of ischemia, EF 56%  . HIV infection (HCC)   . HTN (hypertension)   . Hyperlipidemia   . Hypertensive cardiomyopathy (HCC)   . Hypogonadism in male   . Myalgia and myositis, unspecified   . OAB (overactive bladder)   . Obesity   . Other psoriasis   . Premature baby    born  premature  . Seizure disorder (HCC)   . Sleep apnea   . 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 or other cause, without mention of thyrotoxic crisis or storm    hyperthyroidism  . TIA (transient ischemic attack)   . Type II or unspecified type diabetes mellitus with neurological manifestations, not stated as uncontrolled(250.60)    Past Surgical History:  Procedure Laterality Date  . APPENDECTOMY    . COLOSTOMY    . CORNEAL TRANSPLANT    . feet surgery     both  . HAND SURGERY     left and right  . leg surgery     left and right  . ORTHOPEDIC SURGERY     hands, feet, knees, legs  . tubes in ears     both    Allergies  Allergies  Allergen Reactions  . Penicillins Hives, Itching and Rash    Has patient had a PCN reaction causing immediate rash, facial/tongue/throat swelling, SOB or lightheadedness with hypotension: Yes Has patient had a PCN reaction causing severe rash involving mucus membranes or skin necrosis: No Has patient had a PCN reaction that required hospitalization No Has patient had a PCN reaction occurring within the last 10 years: No If all of the above answers are "NO", then may proceed with Cephalosporin use.   . Erythromycin Base Itching and Rash  History of Present Illness    Cory Campbell is a 40 y.o. male with PMH as above.  Family history includes diabetes and heart disease per PCP paperwork followed at the [facility]).  He fills his medications at the [facility].  He has a history of previous tobacco use from 05/2012 to the summer 2021.  No alcohol or drug use reported.  He divorced in 2012 and is remarried with 2 daughters and 1 son.  His son [name] was born 08/2019 and is doing well.  He lives with his mother as indicated on previous PCP documentation.    He has history of poorly controlled DM2 on ACE inhibitor and with eye exam performed by [facility] with PCP last documented eye  exam 2013.  This is managed by PCP and also endocrinology as of 2015.   He has been seen in the emergency department in the past with hyperglycemia and glucose elevated above 300.  He was diagnosed with hypertension at the age of 45, which has been uncontrolled on beta-blocker, ACE inhibitor, calcium channel blocker, thiazide diuretic, clonidine patch, and hydralazine.  2012 CT at [facility] with normal imaging of kidneys and adrenals.  Renal artery Doppler ultrasound at [facility] 03/2010 negative.  Previous [place] facility work-up not convincing for primary hyperaldosteronism.    He has history of severe sleep apnea on a CPAP with reported compliance.    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   He has been seen in the ED many times for atypical CP. Previous 2013 MPI ruled overall low risk.  2018 echo with EF 50 to 55%, NR WMA.  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  He continues to ambulate with bilateral lower extremity braces.   At the last clinic visit 05/2019, he denied any CP or SOB. No recent falls. His wife was pregnant and expecting a boy 08/2019. A1C not well controlled with pt attempting to eat more salad. No recent lipid panel on Crestor. Compliance with CPAP was stressed, as well as dietary changes, as he was unable to exercise.    Seen 05/26/2020.  He and his wife had a baby boy, named Cory Campbell, and he was sleeping through the night.  He was staying well-hydrated.  He was eating more salad.  He was drinking 2 cups of coffee or tea each morning.  His blood pressure cuff broke and he was working with the community center to replace this.  He reported concern regarding possible PAD.  He reported cold lower extremities and paresthesias/cramping in both upper and lower extremities.  His feet bothered him all of the time.  He felt swelling from the knee down bilaterally.  He reported compliance with CPAP.  He was frequently using his albuterol, which was  discussed is not recommended from a cardiovascular standpoint.  He was taking several medications for blood pressure with BP still uncontrolled at 140/90.  He was started on Entresto and advised to use his albuterol less often.  Today, 07/07/2020, he is seen for follow-up.  Unfortunately, he has still not been able to obtain a blood pressure cuff, so 1 will be provided in office.  He has cut back on his caffeine use and now rarely uses albuterol.  He is taking Entresto with BP significantly improved today and reports that he called the pharmacy today and confirmed to take it twice daily.  He  is very reassured by the fact that his lower extremity ultrasound came back negative.  He denies any chest pain, racing heart rate, or shortness of breath.  No presyncope or syncope.  No signs or symptoms of bleeding.  Home Medications    Current Outpatient Medications on File Prior to Visit  Medication Sig Dispense Refill  . acyclovir (ZOVIRAX) 400 MG tablet Take 400 mg by mouth 2 (two) times daily.    Marland Kitchen albuterol (PROVENTIL HFA;VENTOLIN HFA) 108 (90 BASE) MCG/ACT inhaler Inhale 2 puffs into the lungs every 4 (four) hours as needed for wheezing or shortness of breath.     Marland Kitchen albuterol (PROVENTIL) (2.5 MG/3ML) 0.083% nebulizer solution Take 2.5 mg by nebulization every 4 (four) hours as needed for wheezing or shortness of breath.    . alfuzosin (UROXATRAL) 10 MG 24 hr tablet Take 10 mg by mouth daily.    Marland Kitchen amLODipine (NORVASC) 10 MG tablet Take 10 mg by mouth daily.    Marland Kitchen aspirin EC 81 MG tablet Take 81 mg by mouth daily. Swallow whole.    . benzonatate (TESSALON PERLES) 100 MG capsule Take 1 capsule (100 mg total) by mouth every 6 (six) hours as needed. 30 capsule 0  . BIKTARVY 50-200-25 MG TABS tablet Take 1 tablet by mouth daily.  1  . buprenorphine (BUTRANS) 7.5 MCG/HR Place 7.5 mg onto the skin once a week. Saturday    . buPROPion (WELLBUTRIN XL) 150 MG 24 hr tablet Take 1 tablet by mouth daily.  0  .  cetirizine (ZYRTEC) 10 MG tablet Take 10 mg by mouth daily.    . chlorthalidone (HYGROTON) 25 MG tablet Take 25 mg by mouth daily.    . citalopram (CELEXA) 20 MG tablet Take 20 mg by mouth daily.    . cloNIDine (CATAPRES - DOSED IN MG/24 HR) 0.2 mg/24hr patch Place 0.2 mg onto the skin once a week.    . diclofenac sodium (VOLTAREN) 1 % GEL Apply 2-4 g topically 4 (four) times daily as needed. For pain.    . famotidine (PEPCID) 20 MG tablet Take 20 mg by mouth daily.     . feeding supplement, GLUCERNA SHAKE, (GLUCERNA SHAKE) LIQD Take 237 mLs by mouth 4 (four) times daily.    . ferrous sulfate 325 (65 FE) MG tablet Take 325 mg by mouth 3 (three) times daily.    . finasteride (PROSCAR) 5 MG tablet Take 5 mg by mouth daily.    . fluticasone (FLONASE) 50 MCG/ACT nasal spray Place 2 sprays into both nostrils daily.    . fluticasone (FLOVENT HFA) 110 MCG/ACT inhaler Inhale 2 puffs into the lungs 2 (two) times daily.    Marland Kitchen gabapentin (NEURONTIN) 300 MG capsule Take 300 mg by mouth at bedtime.    Marland Kitchen glucose 4 GM chewable tablet Chew 1 tablet by mouth as needed for low blood sugar.    . insulin degludec (TRESIBA) 100 UNIT/ML FlexTouch Pen Inject 100 Units into the skin 2 (two) times daily.    Marland Kitchen JARDIANCE 10 MG TABS tablet Take 10 mg by mouth daily.    Marland Kitchen ketoconazole (NIZORAL) 2 % shampoo Apply 1 application topically 2 (two) times a week. tues and thursday    . magnesium oxide (MAG-OX) 400 (241.3 Mg) MG tablet Take 1 tablet (400 mg total) by mouth daily. 30 tablet 0  . Melatonin 5 MG TABS Take 10 mg by mouth at bedtime as needed (sleep).    . metFORMIN (GLUCOPHAGE-XR) 500 MG 24  hr tablet Take 1,000 mg by mouth 2 (two) times daily.    . metoprolol tartrate (LOPRESSOR) 100 MG tablet TAKE 1 TABLET(100 MG) BY MOUTH TWICE DAILY 180 tablet 0  . mirtazapine (REMERON) 45 MG tablet Take 45 mg by mouth at bedtime.    . Multiple Vitamin (MULTIVITAMIN) tablet Take 1 tablet by mouth daily.    . nortriptyline (PAMELOR)  25 MG capsule Take 50 mg by mouth at bedtime.    Marland Kitchen NOVOLOG FLEXPEN 100 UNIT/ML FlexPen Inject 40 Units into the skin 3 (three) times daily with meals. With every meal and every snack  4  . omega-3 acid ethyl esters (LOVAZA) 1 g capsule Take 1 g by mouth 3 (three) times daily.    Marland Kitchen omeprazole (PRILOSEC) 20 MG capsule Take 20 mg by mouth daily.    . ondansetron (ZOFRAN-ODT) 4 MG disintegrating tablet Take 4 mg by mouth every 8 (eight) hours as needed for nausea or vomiting.    . polyethylene glycol (MIRALAX / GLYCOLAX) 17 g packet Take 17 g by mouth 3 (three) times daily.    . potassium chloride SA (K-DUR,KLOR-CON) 20 MEQ tablet take 1 tablet by mouth once daily if needed 90 tablet 3  . prednisoLONE acetate (PRED FORTE) 1 % ophthalmic suspension Place 1 drop into the right eye daily.     . rosuvastatin (CRESTOR) 40 MG tablet TAKE 1 TABLET(40 MG) BY MOUTH DAILY 90 tablet 0  . sacubitril-valsartan (ENTRESTO) 49-51 MG Take 1 tablet by mouth 2 (two) times daily. Start day AFTER complete Losartan for 1 week 60 tablet 5  . solifenacin (VESICARE) 10 MG tablet Take 10 mg by mouth daily.    Marland Kitchen Spacer/Aero-Holding Chambers (AEROCHAMBER PLUS) inhaler Use as instructed 1 each 2  . spironolactone (ALDACTONE) 25 MG tablet Take 50 mg by mouth 2 (two) times daily.    Marland Kitchen tiZANidine (ZANAFLEX) 4 MG capsule Take 1 capsule (4 mg total) by mouth 3 (three) times daily as needed for muscle spasms. 30 capsule 0  . TRULICITY 1.5 MG/0.5ML SOPN Inject 1.5 mg into the skin once a week.     No current facility-administered medications on file prior to visit.     Review of Systems    He denies chest pain, palpitations, dyspnea, pnd, orthopnea, n, v, dizziness, syncope, weight gain, or early satiety. He reports b/l LEE. He has bilateral upper and LE paresthesias, cramping, and coldness sensation but reassured by recent studies.  No recent falls.  No signs or symptoms of bleeding.  All other systems reviewed and are otherwise  negative except as noted above.  Physical Exam    VS:  BP 130/70 (BP Location: Left Arm, Patient Position: Sitting, Cuff Size: Normal)   Pulse 91   Ht 5\' 6"  (1.676 m)   Wt 195 lb (88.5 kg)   SpO2 98%   BMI 31.47 kg/m  , BMI Body mass index is 31.47 kg/m. GEN: Well nourished, well developed, in no acute distress. Seated in wheelchair.  HEENT: normal. Neck: Supple.  JVD difficult to assess due to body habitus.  No carotid bruits or masses. Cardiac: RRR, no murmurs, rubs, or gallops. No clubbing, cyanosis, edema.  Radials/DP/PT 2+ and equal bilaterally.  Respiratory:  Respirations regular and unlabored, clear to auscultation bilaterally. GI: Soft, nontender, nondistended, BS + x 4. MS: muscle wasting noted, bilateral LE braces in place Skin: warm and dry, no rash. Neuro:  Strength and sensation are intact. Psych: Normal affect.  Accessory Clinical Findings  ECG personally reviewed by me today -no EKG.  VITALS Reviewed today   Temp Readings from Last 3 Encounters:  06/14/20 98.4 F (36.9 C)  06/07/20 98.1 F (36.7 C) (Oral)  04/15/20 98.4 F (36.9 C) (Oral)   BP Readings from Last 3 Encounters:  07/07/20 130/70  06/14/20 (!) 166/124  06/07/20 (!) 142/93   Pulse Readings from Last 3 Encounters:  07/07/20 91  06/14/20 98  06/07/20 96    Wt Readings from Last 3 Encounters:  07/07/20 195 lb (88.5 kg)  06/14/20 190 lb (86.2 kg)  06/07/20 195 lb 5.2 oz (88.6 kg)     LABS  reviewed today   Lab Results  Component Value Date   WBC 6.0 06/14/2020   HGB 15.3 06/14/2020   HCT 46.7 06/14/2020   MCV 86.8 06/14/2020   PLT 210 06/14/2020   Lab Results  Component Value Date   CREATININE 0.94 06/14/2020   BUN 15 06/14/2020   NA 137 06/14/2020   K 3.9 06/14/2020   CL 104 06/14/2020   CO2 24 06/14/2020   Lab Results  Component Value Date   ALT 24 03/29/2020   AST 23 03/29/2020   ALKPHOS 52 03/29/2020   BILITOT 0.7 03/29/2020   Lab Results  Component Value  Date   CHOL 163 10/07/2016   HDL 26 (L) 10/07/2016   LDLCALC 112 (H) 10/07/2016   TRIG 126 10/07/2016   CHOLHDL 6.3 10/07/2016    Lab Results  Component Value Date   HGBA1C 7.1 (H) 03/30/2020   No results found for: TSH  04/29/20 labs obtained from trials.  [facility] WBC 6.0, RBC 4.48, hemoglobin 13.1, hematocrit 39.7, platelets 232, glucose 111, creatinine 0.89, BUN 16, sodium 139, potassium 4.5, calcium 8.8, albumin 4.1, AST 16, ALT 23   STUDIES/PROCEDURES reviewed today   LE studies 07/01/20 Summary:  Right: Resting right ankle-brachial index is within normal range. No  evidence of significant right lower extremity arterial disease. The right  toe-brachial index is normal.  Left: Resting left ankle-brachial index is within normal range. No  evidence of significant left lower extremity arterial disease. The left  toe-brachial index is normal.   Cardiac monitoring 01/2017 Normal sinus rhythm with rare PVCs  Echo 10/2016 - Left ventricle: Distal septal apical hypokinesis. The cavity size  was normal. Systolic function was normal. The estimated ejection  fraction was in the range of 50% to 55%. Wall motion was normal;  there were no regional wall motion abnormalities.  - Atrial septum: No defect or patent foramen ovale was identified.   MPI 2018 Overall low risk  Assessment & Plan    Hypertension, goal BP < 130/80 -- BP significantly improved today and since starting on Entresto 49-51mg  BID.  BP cuff to be provided today, given he has not been able to afford one and requests one through out office.  Reports medication compliance with all antihypertensives.  Continue amlodipine  daily, chlorthalidone  daily, clonidine patch (for now), hydralazine, spironolactone dose, and metoprolol  BID. Continue Entresto 49-51 twice daily with repeat BMET today to confirm tolerating well.  If BMET shows renal function well-controlled, escalate Entresto at  that time.  Ideally, wean off clonidine patch completely and reduce dose of spironolactone in the future.  Hypertensive heart disease/HFmrEF  -- Euvolemic and well compensated on exam today.  Previous echo with EF 50 to 55% and distal septal apical hypokinesis.  Continue to escalate GDMT as tolerated by renal function.  Started on  Entresto and tolerating well as above. If renal function allows, will plan to escalate further. Continue with amlodipine, BB, spironolactone, chlorthalidone. Continue Jardiance per GMDT. Ideally, wean off of clonidine. Recommend repeat echo at RTC.  Bilateral lower extremity pain and coldness --Bilateral lower extremity studies without significant right or left lower extremity arterial disease. Consider that MSK disorder could be the etiology of his bilateral LE pain, as well as his poorly controlled DM2.  Discussed recommendation for risk factor modification, including glycemic control.   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 Previous history of atypical CP, attributed to MSK etiology given known MSK disorder. 2018 MPI low risk. 2018 echo EF 50-55%. Monitoring NSR with PVCs as above. Given no active sx, no indication for repeat ischemic workup at this time.  Reports medication compliance.  Recommend ongoing risk factor modification.   History of palpitations / PVCs  --No recent palpitations. Previous monitor as above with PVCs and NSR. Continue current BB.  Reports medication compliance.  Sinus tachycardia --Asx. No feeling of racing HR. Discussed ST likely 2/2 deconditioning, as well as any caffeine, albuterol, +/- prednisone.  Reports medication compliance.  History of falls  --No recent falls. On review of EMR, previous long history of falls and injury. Continue to monitor.   OSA on CPAP --Reports compliance. Ongoing compliance recommended.  Asthma -- Reports reduced use of albuterol, which is reassuring, given less frequent  dosing is preferred from a cardiovascular standpoint.  Chronic pain disorder / arthrogryposis multiplex congenita --Consider as playing a role in previous atypical CP, as well as possibly contributing to bilateral LE and UE pain as above in HPI. Continue to monitor per PCP.  DM2 --A1C poorly controlled. Glycemic control recommended from a cardiovascular standpoint. Ongoing heart healthy and low carb diet recommended, especially given unable to exercise on a regular basis given MSK disorder as directly above. Continue Jardiance.   Previous tobacco use - Ongoing cessation recommended.  -----------------  Medication changes:  --None.  Labs ordered:  --BMET today (recheck Cr/K on Entresto) Studies / Imaging ordered:  --None Future considerations: --Wean off of clonidine --Change chlorthalidone to lasix? --Repeat echo - further workup if EF reduced or WMA --Lipid and liver function if not yet checked by PCP Disposition:  --RTC 6 mo  *Please be aware that the above documentation was completed voice recognition software and may contain dictation errors.      Lennon Alstrom, PA-C 07/07/2020

## 2020-07-07 NOTE — Patient Instructions (Signed)
Medication Instructions:  Please continue your current medications   *If you need a refill on your cardiac medications before your next appointment, please call your pharmacy*   Lab Work: BMET  LABS WILL APPEAR ON MYCHART, ABNORMAL RESULTS WILL BE CALLED  Testing/Procedures: None   Follow-Up: At [facility], you and your health needs are our priority.  As part of our continuing mission to provide you with exceptional heart care, we have created designated Provider Care Teams.  These Care Teams include your primary Cardiologist (physician) and Advanced Practice Providers (APPs -  Physician Assistants and Nurse Practitioners) who all work together to provide you with the care you need, when you need it.  Your next appointment:   6 month(s)  The format for your next appointment:   In Person  Provider:   Marisue Ivan, PA-C   Other Instructions We provided you with an in-home BP machine Please monitor blood pressures and keep a log of your readings.  Call the clinic in 1 week with BP readings.   How to use a home blood pressure monitor. . Be still. . Measure at the same time every day. It's important to take the readings at the same time each day, such as morning and evening. Take reading approximately 1 1/2 to 2 hours after BP medications.   AVOID these things for 30 minutes before checking your blood pressure:  Drinking caffeine.  Drinking alcohol.  Eating.  Smoking.  Exercising.   Five minutes before checking your blood pressure:  Pee.  Sit in a dining chair. Avoid sitting in a soft couch or armchair.  Be quiet. Do not talk.       Sit correctly. Sit with your back straight and supported (on a dining chair, rather than a sofa). Your feet should be flat on the floor and your legs should not be crossed. Your arm should be supported on a flat surface (such as a table) with the upper arm at heart level. Make sure the bottom of the cuff is placed directly above  the bend of the elbow.

## 2020-07-08 LAB — BASIC METABOLIC PANEL
BUN/Creatinine Ratio: 16 (ref 9–20)
BUN: 16 mg/dL (ref 6–24)
CO2: 20 mmol/L (ref 20–29)
Calcium: 10.1 mg/dL (ref 8.7–10.2)
Chloride: 101 mmol/L (ref 96–106)
Creatinine, Ser: 0.99 mg/dL (ref 0.76–1.27)
Glucose: 119 mg/dL — ABNORMAL HIGH (ref 65–99)
Potassium: 4.8 mmol/L (ref 3.5–5.2)
Sodium: 139 mmol/L (ref 134–144)
eGFR: 99 mL/min/{1.73_m2} (ref >59)

## 2020-08-13 ENCOUNTER — Other Ambulatory Visit: Payer: Self-pay

## 2020-08-13 ENCOUNTER — Encounter: Payer: Self-pay | Admitting: Emergency Medicine

## 2020-08-13 ENCOUNTER — Ambulatory Visit
Admission: EM | Admit: 2020-08-13 | Discharge: 2020-08-13 | Disposition: A | Discharge status: Home / Self Care | Payer: Medicaid Other | Attending: Physician Assistant | Admitting: Physician Assistant

## 2020-08-13 DIAGNOSIS — B3742 Candidal balanitis: Secondary | ICD-10-CM

## 2020-08-13 MED ORDER — CLOTRIMAZOLE 1 % EX CREA: TOPICAL_CREAM | CUTANEOUS | 0 refills | Status: AC

## 2020-08-13 MED ORDER — FLUCONAZOLE 150 MG PO TABS: 150.0000 mg | ORAL_TABLET | Freq: Once | ORAL | 0 refills | Status: AC

## 2020-08-13 NOTE — ED Provider Notes (Signed)
[facility]    CSN: [id number] Arrival date & time: 08/13/20  0827      History   Chief Complaint Chief Complaint  Patient presents with   Rash    HPI Cory Campbell is a 40 y.o. male presenting with itchy rash of the penis that he noticed yesterday.  Patient states there is also some whitish discharge around the head of the penis.  He states that he has had this before and has been given either a cream, powder or pill to take and it resolves.  Patient does have history of diabetes but says that his blood sugars are under control.  Patient also has medical history significant for arthrogryposis multiplex congenita, heart failure, BPH, erectile dysfunction, genital herpes, HIV, hypertension, hyperlipidemia, overactive bladder, sleep apnea, seizure disorder, and previous history of TIA.  Patient uses a wheelchair and has difficulty ambulating.  He has bilateral ankle-foot orthotics.  Patient states that he cares for himself and does not have help.  He has no other complaints.  HPI  Past Medical History:  Diagnosis Date   (HFpEF) heart failure with preserved ejection fraction (HCC)    Abrasion or friction burn of foot and toe(s), without mention of infection    Acid reflux    Adopted    Allergic rhinitis, cause unspecified    Anxiety    Arthritis    Arthrogryposis    Asthma    Bladder wall thickening    BPH (benign prostatic hyperplasia)    Chronic pain syndrome    Depressive disorder, not elsewhere classified    Diabetes mellitus without complication (HCC)    Diverticulitis of colon (without mention of hemorrhage)(562.11)    ED (erectile dysfunction)    Herpes genitalis    History of echocardiogram    a. 10/2016: EF 50-55%, normal wall motion   History of stress test    a. 06/2011: significant GI uptake artifact, no evidence of ischemia, EF 56%   HIV infection (HCC)    HTN (hypertension)    Hyperlipidemia    Hypertensive cardiomyopathy (HCC)    Hypogonadism in  male    Myalgia and myositis, unspecified    OAB (overactive bladder)    Obesity    Other psoriasis    Premature baby    born premature   Seizure disorder (HCC)    Sleep apnea    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 or other cause, without mention of thyrotoxic crisis or storm    hyperthyroidism   TIA (transient ischemic attack)    Type II or unspecified type diabetes mellitus with neurological manifestations, not stated as uncontrolled(250.60)     Patient Active Problem List   Diagnosis Date Noted   Acute asthma exacerbation 03/30/2020   Acute respiratory failure with hypoxia (HCC) 03/29/2020   HIV infection (HCC)    Surgical wound, non healing    Acute colitis 05/20/2017   Colitis    Acute renal failure (HCC) 05/15/2017   Diarrhea 05/15/2017   Sepsis (HCC) 05/15/2017   Abdominal pain 11/25/2016   HIV (human immunodeficiency virus infection) (HCC) 11/19/2016   Hypotension 10/08/2016   Unstable angina (HCC) 10/06/2016   Shortness of breath 06/23/2015   Obesity 05/18/2014   Frequent falls 08/12/2013   Pain of left clavicle 08/12/2013   Tachycardia 01/21/2013   Chronic pain syndrome 08/12/2012   Obstructive sleep apnea on CPAP 06/07/2012   Chest pain 06/20/2011   Hypertension associated with diabetes (HCC) 06/20/2011   Type 2 diabetes mellitus  with complication, without long-term current use of insulin (HCC) 06/20/2011   Diabetes mellitus (HCC) 06/20/2011   Asthma, chronic, unspecified asthma severity, with acute exacerbation 01/18/2008   Hyperlipidemia associated with type 2 diabetes mellitus (HCC) 08/28/2006   Essential hypertension 06/27/2005    Past Surgical History:  Procedure Laterality Date   APPENDECTOMY     COLOSTOMY     CORNEAL TRANSPLANT     feet surgery     both   HAND SURGERY     left and right   leg surgery     left and right   ORTHOPEDIC SURGERY     hands, feet, knees, legs   tubes in ears     both       Home Medications    Prior to  Admission medications   Medication Sig Start Date End Date Taking? Authorizing Provider  acyclovir (ZOVIRAX) 400 MG tablet Take 400 mg by mouth 2 (two) times daily.   Yes [provider]  albuterol (PROVENTIL HFA;VENTOLIN HFA) 108 (90 BASE) MCG/ACT inhaler Inhale 2 puffs into the lungs every 4 (four) hours as needed for wheezing or shortness of breath.    Yes [provider]  alfuzosin (UROXATRAL) 10 MG 24 hr tablet Take 10 mg by mouth daily.   Yes [provider]  amLODipine (NORVASC) 10 MG tablet Take 10 mg by mouth daily.   Yes [provider]  aspirin EC 81 MG tablet Take 81 mg by mouth daily. Swallow whole.   Yes [provider]  BIKTARVY 50-200-25 MG TABS tablet Take 1 tablet by mouth daily. 11/21/16  Yes [provider]  buPROPion (WELLBUTRIN XL) 150 MG 24 hr tablet Take 1 tablet by mouth daily. 11/16/16  Yes [provider]  cetirizine (ZYRTEC) 10 MG tablet Take 10 mg by mouth daily.   Yes [provider]  chlorthalidone (HYGROTON) 25 MG tablet Take 25 mg by mouth daily.   Yes [provider]  citalopram (CELEXA) 20 MG tablet Take 20 mg by mouth daily.   Yes [provider]  cloNIDine (CATAPRES - DOSED IN MG/24 HR) 0.2 mg/24hr patch Place 0.2 mg onto the skin once a week.   Yes [provider]  clotrimazole (LOTRIMIN) 1 % cream Apply to affected area 2 times daily 08/13/20 08/24/20 Yes Eusebio Friendly B, PA-C  diclofenac sodium (VOLTAREN) 1 % GEL Apply 2-4 g topically 4 (four) times daily as needed. For pain.   Yes [provider]  famotidine (PEPCID) 20 MG tablet Take 20 mg by mouth daily.  01/03/16  Yes [provider]  ferrous sulfate 325 (65 FE) MG tablet Take 325 mg by mouth 3 (three) times daily.   Yes [provider]  finasteride (PROSCAR) 5 MG tablet Take 5 mg by mouth daily.   Yes [provider]  fluconazole (DIFLUCAN) 150 MG tablet Take 1 tablet (150  mg total) by mouth once for 1 dose. 08/13/20 08/13/20 Yes Eusebio Friendly B, PA-C  fluticasone (FLONASE) 50 MCG/ACT nasal spray Place 2 sprays into both nostrils daily.   Yes [provider]  fluticasone (FLOVENT HFA) 110 MCG/ACT inhaler Inhale 2 puffs into the lungs 2 (two) times daily.   Yes [provider]  glucose 4 GM chewable tablet Chew 1 tablet by mouth as needed for low blood sugar.   Yes [provider]  JARDIANCE 10 MG TABS tablet Take 10 mg by mouth daily. 01/19/20  Yes [provider]  magnesium oxide (  MAG-OX) 400 (241.3 Mg) MG tablet Take 1 tablet (400 mg total) by mouth daily. 05/23/17  Yes Salary, Evelena Asa, MD  Melatonin 5 MG TABS Take 10 mg by mouth at bedtime as needed (sleep).   Yes [provider]  metFORMIN (GLUCOPHAGE-XR) 500 MG 24 hr tablet Take 1,000 mg by mouth 2 (two) times daily.   Yes [provider]  metoprolol tartrate (LOPRESSOR) 100 MG tablet TAKE 1 TABLET(100 MG) BY MOUTH TWICE DAILY 07/06/20  Yes Gollan, Tollie Pizza, MD  mirtazapine (REMERON) 45 MG tablet Take 45 mg by mouth at bedtime.   Yes [provider]  Multiple Vitamin (MULTIVITAMIN) tablet Take 1 tablet by mouth daily.   Yes [provider]  nortriptyline (PAMELOR) 25 MG capsule Take 50 mg by mouth at bedtime.   Yes [provider]  NOVOLOG FLEXPEN 100 UNIT/ML FlexPen Inject 40 Units into the skin 3 (three) times daily with meals. With every meal and every snack 12/13/15  Yes [provider]  omega-3 acid ethyl esters (LOVAZA) 1 g capsule Take 1 g by mouth 3 (three) times daily.   Yes [provider]  potassium chloride SA (K-DUR,KLOR-CON) 20 MEQ tablet take 1 tablet by mouth once daily if needed 02/08/16  Yes Gollan, Tollie Pizza, MD  rosuvastatin (CRESTOR) 40 MG tablet TAKE 1 TABLET(40 MG) BY MOUTH DAILY 04/30/19  Yes Gollan, Tollie Pizza, MD  sacubitril-valsartan (ENTRESTO) 49-51 MG Take 1 tablet by mouth 2 (two) times daily.  Start day AFTER complete Losartan for 1 week 06/03/20  Yes Visser, Jacquelyn D, PA-C  solifenacin (VESICARE) 10 MG tablet Take 10 mg by mouth daily.   Yes [provider]  spironolactone (ALDACTONE) 25 MG tablet Take 50 mg by mouth 2 (two) times daily.   Yes [provider]  tiZANidine (ZANAFLEX) 4 MG capsule Take 1 capsule (4 mg total) by mouth 3 (three) times daily as needed for muscle spasms. 04/14/19  Yes Triplett, Cari B, FNP  TRULICITY 1.5 MG/0.5ML SOPN Inject 1.5 mg into the skin once a week. 01/14/20  Yes [provider]  albuterol (PROVENTIL) (2.5 MG/3ML) 0.083% nebulizer solution Take 2.5 mg by nebulization every 4 (four) hours as needed for wheezing or shortness of breath.    [provider]  benzonatate (TESSALON PERLES) 100 MG capsule Take 1 capsule (100 mg total) by mouth every 6 (six) hours as needed. 04/15/20 04/15/21  Cuthriell, Delorise Royals, PA-C  buprenorphine (BUTRANS) 7.5 MCG/HR Place 7.5 mg onto the skin once a week. Saturday    [provider]  feeding supplement, GLUCERNA SHAKE, (GLUCERNA SHAKE) LIQD Take 237 mLs by mouth 4 (four) times daily.    [provider]  gabapentin (NEURONTIN) 300 MG capsule Take 300 mg by mouth at bedtime.    [provider]  insulin degludec (TRESIBA) 100 UNIT/ML FlexTouch Pen Inject 100 Units into the skin 2 (two) times daily.    [provider]  ketoconazole (NIZORAL) 2 % shampoo Apply 1 application topically 2 (two) times a week. tues and thursday 08/27/14   [provider]  omeprazole (PRILOSEC) 20 MG capsule Take 20 mg by mouth daily.    [provider]  ondansetron (ZOFRAN-ODT) 4 MG disintegrating tablet Take 4 mg by mouth every 8 (eight) hours as needed for nausea or vomiting.    [provider]  polyethylene glycol (MIRALAX / GLYCOLAX) 17 g packet Take 17 g by mouth 3 (three) times daily.    [provider]  prednisoLONE acetate (PRED FORTE) 1 %  ophthalmic suspension Place 1 drop into the right eye daily.     [provider]  Spacer/Aero-Holding Chambers (AEROCHAMBER PLUS) inhaler Use as instructed 03/23/18   Domenick GongMortenson, Ashley, MD    Family History Family History  Adopted: Yes  Family history unknown: Yes    Social History Social History   Tobacco Use   Smoking status: Former    Pack years: 0.00    Types: Pipe    Quit date: 01/2018    Years since quitting: 2.6   Smokeless tobacco: Never  Vaping Use   Vaping Use: Some days   Substances: Nicotine, Flavoring  Substance Use Topics   Alcohol use: Not Currently    Alcohol/week: 0.0 standard drinks    Comment: used to be moderate   Drug use: No     Allergies   Penicillins and Erythromycin base   Review of Systems Review of Systems  Constitutional:  Negative for fatigue and fever.  Gastrointestinal:  Negative for abdominal pain, nausea and vomiting.  Genitourinary:  Positive for penile discharge. Negative for dysuria, frequency, genital sores, hematuria, penile pain, scrotal swelling and testicular pain.  Skin:  Positive for rash.    Physical Exam Triage Vital Signs ED Triage Vitals  Enc Vitals Group     BP 08/13/20 0855 (!) 159/125     Pulse Rate 08/13/20 0855 94     Resp 08/13/20 0855 18     Temp 08/13/20 0855 98.2 F (36.8 C)     Temp Source 08/13/20 0855 Oral     SpO2 08/13/20 0855 96 %     Weight 08/13/20 0852 195 lb 1.7 oz (88.5 kg)     Height 08/13/20 0852 5\' 6"  (1.676 m)     Head Circumference --      Peak Flow --      Pain Score 08/13/20 0852 0     Pain Loc --      Pain Edu? --      Excl. in GC? --    No data found.  Updated Vital Signs BP (!) 159/125 (BP Location: Left Arm)   Pulse 94   Temp 98.2 F (36.8 C) (Oral)   Resp 18   Ht 5\' 6"  (1.676 m)   Wt 195 lb 1.7 oz (88.5 kg)   SpO2 96%   BMI 31.49 kg/m      Physical Exam Vitals and nursing note reviewed. Exam conducted with a chaperone present ([place]Brittany O'dell).   Constitutional:      General: He is not in acute distress.    Appearance: Normal appearance. He is well-developed. He is not ill-appearing.  HENT:     Head: Normocephalic and atraumatic.  Eyes:     General: No scleral icterus.    Conjunctiva/sclera: Conjunctivae normal.  Cardiovascular:     Rate and Rhythm: Normal rate and regular rhythm.     Heart sounds: Normal heart sounds.  Pulmonary:     Effort: Pulmonary effort is normal. No respiratory distress.     Breath sounds: Normal breath sounds.  Genitourinary:    Penis: Uncircumcised. Discharge (thick white discharge around glans when foreskin is retracted) present.      Testes: Normal.  Musculoskeletal:     Cervical back: Neck supple.  Skin:    General: Skin is warm and dry.  Neurological:     General: No focal deficit present.     Mental Status: He is alert. Mental status is at baseline.  Psychiatric:        Mood and Affect: Mood normal.        Behavior: Behavior normal.        Thought Content: Thought content normal.     UC Treatments / Results  Labs (all labs ordered are listed, but only abnormal results are displayed) Labs Reviewed - No data to display  EKG   Radiology No results found.  Procedures Procedures (including critical care time)  Medications Ordered in UC Medications - No data to display  Initial Impression / Assessment and Plan / UC Course  I have reviewed the triage vital signs and the nursing notes.  Pertinent labs & imaging results that were available during my care of the patient were reviewed by me and considered in my medical decision making (see chart for details).  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  Exam reveals thick white discharge around the glans penis when foreskin is retracted.  Area is a little tender.  This is consistent with candidal balanitis.  Reviewed cleaning the area  well.  Have sent in clotrimazole as well as Diflucan.  Advised him to follow-up with PCP or our department as needed if not improving.   Final Clinical Impressions(s) / UC Diagnoses   Final diagnoses:  Candidal balanitis     Discharge Instructions      You have a skin/penile yeast infection.  I have sent over a cream for this as well as a pill only take once.  Make sure that you are retracting the foreskin and cleaning area very well and making sure it is dry.  That is why this can be recurrent.  Also, if your blood sugars are not under control you are at higher risk for yeast infections.  Follow-up with [place] as needed.     ED Prescriptions     Medication Sig Dispense Auth. Provider   clotrimazole (LOTRIMIN) 1 % cream Apply to affected area 2 times daily 28 g Michiel Cowboy, Ersa Delaney B, PA-C   fluconazole (DIFLUCAN) 150 MG tablet Take 1 tablet (150 mg total) by mouth once for 1 dose. 1 tablet Gareth Morgan      PDMP not reviewed this encounter.   Shirlee Latch, PA-C 08/13/20 [phone]

## 2020-08-13 NOTE — Discharge Instructions (Addendum)
You have a skin/penile yeast infection.  I have sent over a cream for this as well as a pill only take once.  Make sure that you are retracting the foreskin and cleaning area very well and making sure it is dry.  That is why this can be recurrent.  Also, if your blood sugars are not under control you are at higher risk for yeast infections.  Follow-up with [place] as needed.

## 2020-08-13 NOTE — ED Triage Notes (Signed)
Pt c/o rash in his genital area. Started yesterday. He states he has had this before ans given a cream. He states the area has an odor and itches.

## 2020-08-16 ENCOUNTER — Ambulatory Visit
Admission: EM | Admit: 2020-08-16 | Discharge: 2020-08-16 | Disposition: A | Discharge status: Home / Self Care | Payer: Medicaid Other | Attending: Emergency Medicine | Admitting: Emergency Medicine

## 2020-08-16 ENCOUNTER — Other Ambulatory Visit: Payer: Self-pay

## 2020-08-16 DIAGNOSIS — M549 Dorsalgia, unspecified: Secondary | ICD-10-CM | POA: Insufficient documentation

## 2020-08-16 LAB — URINALYSIS, COMPLETE (UACMP) WITH MICROSCOPIC
Bilirubin Urine: NEGATIVE
Glucose, UA: >1000 mg/dL — AB
Ketones, ur: NEGATIVE mg/dL
Leukocytes,Ua: NEGATIVE
Nitrite: NEGATIVE
Protein, ur: 30 mg/dL — AB
Specific Gravity, Urine: 1.015 (ref 1.005–1.030)
pH: 7 (ref 5.0–8.0)

## 2020-08-16 MED ORDER — BACLOFEN 20 MG PO TABS: 20.0000 mg | ORAL_TABLET | Freq: Three times a day (TID) | ORAL | 0 refills | Status: DC

## 2020-08-16 NOTE — ED Triage Notes (Signed)
Patient presents to Urgent Care with complaints of back pain since Saturday. Treating pain with tylenol. Reports only changes in medications was the antibiotic medication prescribed on 07/08. Concerned with possible muscle strain or kidney infection.    Denies fever, abdominal pain, hematuria, or any urinary symptoms.

## 2020-08-16 NOTE — ED Provider Notes (Signed)
[facility]    CSN: [id number] Arrival date & time: 08/16/20  1239      History   Chief Complaint Chief Complaint  Patient presents with   Back Pain    HPI Cory Campbell is a 40 y.o. male.   HPI  40 year old male here for evaluation of back pain.  Patient reports that he has been having pain in his mid and low back for the past 2 days.  He reports that the pain increased yesterday and that increases with movements.  He denies any injury or heavy lifting.  He states that he is concerned this either muscle strain or a kidney infection.  Patient is both diabetic and also HIV positive.  He was just evaluated in this urgent care 3 days ago and diagnosed with balanitis of his penis.  He denies any painful urination, urinary urgency or frequency, blood in his urine, fever, abdominal pain, nausea, or vomiting.  He has not had any falls or increase in physical activity.  Past Medical History:  Diagnosis Date   (HFpEF) heart failure with preserved ejection fraction (HCC)    Abrasion or friction burn of foot and toe(s), without mention of infection    Acid reflux    Adopted    Allergic rhinitis, cause unspecified    Anxiety    Arthritis    Arthrogryposis    Asthma    Bladder wall thickening    BPH (benign prostatic hyperplasia)    Chronic pain syndrome    Depressive disorder, not elsewhere classified    Diabetes mellitus without complication (HCC)    Diverticulitis of colon (without mention of hemorrhage)(562.11)    ED (erectile dysfunction)    Herpes genitalis    History of echocardiogram    a. 10/2016: EF 50-55%, normal wall motion   History of stress test    a. 06/2011: significant GI uptake artifact, no evidence of ischemia, EF 56%   HIV infection (HCC)    HTN (hypertension)    Hyperlipidemia    Hypertensive cardiomyopathy (HCC)    Hypogonadism in male    Myalgia and myositis, unspecified    OAB (overactive bladder)    Obesity    Other psoriasis     Premature baby    born premature   Seizure disorder (HCC)    Sleep apnea    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 or other cause, without mention of thyrotoxic crisis or storm    hyperthyroidism   TIA (transient ischemic attack)    Type II or unspecified type diabetes mellitus with neurological manifestations, not stated as uncontrolled(250.60)     Patient Active Problem List   Diagnosis Date Noted   Acute asthma exacerbation 03/30/2020   Acute respiratory failure with hypoxia (HCC) 03/29/2020   HIV infection (HCC)    Surgical wound, non healing    Acute colitis 05/20/2017   Colitis    Acute renal failure (HCC) 05/15/2017   Diarrhea 05/15/2017   Sepsis (HCC) 05/15/2017   Abdominal pain 11/25/2016   HIV (human immunodeficiency virus infection) (HCC) 11/19/2016   Hypotension 10/08/2016   Unstable angina (HCC) 10/06/2016   Shortness of breath 06/23/2015   Obesity 05/18/2014   Frequent falls 08/12/2013   Pain of left clavicle 08/12/2013   Tachycardia 01/21/2013   Chronic pain syndrome 08/12/2012   Obstructive sleep apnea on CPAP 06/07/2012   Chest pain 06/20/2011   Hypertension associated with diabetes (HCC) 06/20/2011   Type 2 diabetes mellitus with complication, without long-term current  use of insulin (HCC) 06/20/2011   Diabetes mellitus (HCC) 06/20/2011   Asthma, chronic, unspecified asthma severity, with acute exacerbation 01/18/2008   Hyperlipidemia associated with type 2 diabetes mellitus (HCC) 08/28/2006   Essential hypertension 06/27/2005    Past Surgical History:  Procedure Laterality Date   APPENDECTOMY     COLOSTOMY     CORNEAL TRANSPLANT     feet surgery     both   HAND SURGERY     left and right   leg surgery     left and right   ORTHOPEDIC SURGERY     hands, feet, knees, legs   tubes in ears     both       Home Medications    Prior to Admission medications   Medication Sig Start Date End Date Taking? Authorizing Provider  baclofen  (LIORESAL) 20 MG tablet Take 1 tablet (20 mg total) by mouth 3 (three) times daily. 08/16/20  Yes Becky Augustayan, Babak Lucus, NP  acyclovir (ZOVIRAX) 400 MG tablet Take 400 mg by mouth 2 (two) times daily.    [provider]  albuterol (PROVENTIL HFA;VENTOLIN HFA) 108 (90 BASE) MCG/ACT inhaler Inhale 2 puffs into the lungs every 4 (four) hours as needed for wheezing or shortness of breath.     [provider]  albuterol (PROVENTIL) (2.5 MG/3ML) 0.083% nebulizer solution Take 2.5 mg by nebulization every 4 (four) hours as needed for wheezing or shortness of breath.    [provider]  alfuzosin (UROXATRAL) 10 MG 24 hr tablet Take 10 mg by mouth daily.    [provider]  amLODipine (NORVASC) 10 MG tablet Take 10 mg by mouth daily.    [provider]  aspirin EC 81 MG tablet Take 81 mg by mouth daily. Swallow whole.    [provider]  benzonatate (TESSALON PERLES) 100 MG capsule Take 1 capsule (100 mg total) by mouth every 6 (six) hours as needed. 04/15/20 04/15/21  Cuthriell, Christiane HaJonathan D, PA-C  BIKTARVY 50-200-25 MG TABS tablet Take 1 tablet by mouth daily. 11/21/16   [provider]  buprenorphine (BUTRANS) 7.5 MCG/HR Place 7.5 mg onto the skin once a week. Saturday    [provider]  buPROPion (WELLBUTRIN XL) 150 MG 24 hr tablet Take 1 tablet by mouth daily. 11/16/16   [provider]  cetirizine (ZYRTEC) 10 MG tablet Take 10 mg by mouth daily.    [provider]  chlorthalidone (HYGROTON) 25 MG tablet Take 25 mg by mouth daily.    [provider]  citalopram (CELEXA) 20 MG tablet Take 20 mg by mouth daily.    [provider]  cloNIDine (CATAPRES - DOSED IN MG/24 HR) 0.2 mg/24hr patch Place 0.2 mg onto the skin once a week.    [provider]  clotrimazole (LOTRIMIN) 1 % cream Apply to affected area 2 times daily 08/13/20 08/24/20  Eusebio FriendlyEaves, Lesley B, PA-C  diclofenac sodium (VOLTAREN) 1 % GEL Apply 2-4 g  topically 4 (four) times daily as needed. For pain.    [provider]  famotidine (PEPCID) 20 MG tablet Take 20 mg by mouth daily.  01/03/16   [provider]  feeding supplement, GLUCERNA SHAKE, (GLUCERNA SHAKE) LIQD Take 237 mLs by mouth 4 (four) times daily.    [provider]  ferrous sulfate 325 (65 FE) MG tablet Take 325 mg by mouth 3 (three) times daily.    [provider]  finasteride (PROSCAR) 5 MG tablet Take 5  mg by mouth daily.    [provider]  fluticasone (FLONASE) 50 MCG/ACT nasal spray Place 2 sprays into both nostrils daily.    [provider]  fluticasone (FLOVENT HFA) 110 MCG/ACT inhaler Inhale 2 puffs into the lungs 2 (two) times daily.    [provider]  gabapentin (NEURONTIN) 300 MG capsule Take 300 mg by mouth at bedtime.    [provider]  glucose 4 GM chewable tablet Chew 1 tablet by mouth as needed for low blood sugar.    [provider]  insulin degludec (TRESIBA) 100 UNIT/ML FlexTouch Pen Inject 100 Units into the skin 2 (two) times daily.    [provider]  JARDIANCE 10 MG TABS tablet Take 10 mg by mouth daily. 01/19/20   [provider]  ketoconazole (NIZORAL) 2 % shampoo Apply 1 application topically 2 (two) times a week. tues and thursday 08/27/14   [provider]  magnesium oxide (MAG-OX) 400 (241.3 Mg) MG tablet Take 1 tablet (400 mg total) by mouth daily. 05/23/17   Salary, Jetty Duhamel D, MD  Melatonin 5 MG TABS Take 10 mg by mouth at bedtime as needed (sleep).    [provider]  metFORMIN (GLUCOPHAGE-XR) 500 MG 24 hr tablet Take 1,000 mg by mouth 2 (two) times daily.    [provider]  metoprolol tartrate (LOPRESSOR) 100 MG tablet TAKE 1 TABLET(100 MG) BY MOUTH TWICE DAILY 07/06/20   Antonieta Iba, MD  mirtazapine (REMERON) 45 MG tablet Take 45 mg by mouth at bedtime.    [provider]  Multiple Vitamin (MULTIVITAMIN) tablet  Take 1 tablet by mouth daily.    [provider]  nortriptyline (PAMELOR) 25 MG capsule Take 50 mg by mouth at bedtime.    [provider]  NOVOLOG FLEXPEN 100 UNIT/ML FlexPen Inject 40 Units into the skin 3 (three) times daily with meals. With every meal and every snack 12/13/15   [provider]  omega-3 acid ethyl esters (LOVAZA) 1 g capsule Take 1 g by mouth 3 (three) times daily.    [provider]  omeprazole (PRILOSEC) 20 MG capsule Take 20 mg by mouth daily.    [provider]  ondansetron (ZOFRAN-ODT) 4 MG disintegrating tablet Take 4 mg by mouth every 8 (eight) hours as needed for nausea or vomiting.    [provider]  polyethylene glycol (MIRALAX / GLYCOLAX) 17 g packet Take 17 g by mouth 3 (three) times daily.    [provider]  potassium chloride SA (K-DUR,KLOR-CON) 20 MEQ tablet take 1 tablet by mouth once daily if needed 02/08/16   Antonieta Iba, MD  prednisoLONE acetate (PRED FORTE) 1 % ophthalmic suspension Place 1 drop into the right eye daily.     [provider]  rosuvastatin (CRESTOR) 40 MG tablet TAKE 1 TABLET(40 MG) BY MOUTH DAILY 04/30/19   Gollan, Tollie Pizza, MD  sacubitril-valsartan (ENTRESTO) 49-51 MG Take 1 tablet by mouth 2 (two) times daily. Start day AFTER complete Losartan for 1 week 06/03/20   Marisue Ivan D, PA-C  solifenacin (VESICARE) 10 MG tablet Take 10 mg by mouth daily.    [provider]  Spacer/Aero-Holding Chambers (AEROCHAMBER PLUS) inhaler Use as instructed 03/23/18   Domenick Gong, MD  spironolactone (ALDACTONE) 25 MG tablet Take 50 mg by mouth 2 (two) times daily.    [provider]  tiZANidine (ZANAFLEX) 4 MG capsule Take 1 capsule (4 mg total) by mouth 3 (three) times  daily as needed for muscle spasms. 04/14/19   Triplett, Cari B, FNP  TRULICITY 1.5 MG/0.5ML SOPN Inject 1.5 mg into the skin once a week. 01/14/20   [provider]    Family  History Family History  Adopted: Yes  Family history unknown: Yes    Social History Social History   Tobacco Use   Smoking status: Former    Pack years: 0.00    Types: Pipe    Quit date: 01/2018    Years since quitting: 2.6   Smokeless tobacco: Never  Vaping Use   Vaping Use: Some days   Substances: Nicotine, Flavoring  Substance Use Topics   Alcohol use: Not Currently    Alcohol/week: 0.0 standard drinks    Comment: used to be moderate   Drug use: No     Allergies   Penicillins and Erythromycin base   Review of Systems Review of Systems  Gastrointestinal:  Negative for abdominal pain, diarrhea, nausea and vomiting.  Genitourinary:  Negative for dysuria, flank pain, frequency, hematuria and urgency.  Musculoskeletal:  Positive for back pain.  Hematological: Negative.   Psychiatric/Behavioral: Negative.      Physical Exam Triage Vital Signs ED Triage Vitals  Enc Vitals Group     BP 08/16/20 1254 (!) 155/128     Pulse Rate 08/16/20 1254 100     Resp 08/16/20 1254 18     Temp 08/16/20 1254 98 F (36.7 C)     Temp Source 08/16/20 1254 Oral     SpO2 08/16/20 1254 98 %     Weight --      Height --      Head Circumference --      Peak Flow --      Pain Score 08/16/20 1251 9     Pain Loc --      Pain Edu? --      Excl. in GC? --    No data found.  Updated Vital Signs BP (!) 155/128 (BP Location: Left Arm)   Pulse 100   Temp 98 F (36.7 C) (Oral)   Resp 18   SpO2 98%   Visual Acuity Right Eye Distance:   Left Eye Distance:   Bilateral Distance:    Right Eye Near:   Left Eye Near:    Bilateral Near:     Physical Exam Vitals and nursing note reviewed.  Constitutional:      General: He is not in acute distress.    Appearance: Normal appearance. He is not ill-appearing.  HENT:     Head: Normocephalic and atraumatic.  Cardiovascular:     Rate and Rhythm: Normal rate and regular rhythm.     Pulses: Normal pulses.     Heart sounds: Normal heart  sounds. No murmur heard.   No gallop.  Pulmonary:     Effort: Pulmonary effort is normal.     Breath sounds: Normal breath sounds. No wheezing, rhonchi or rales.  Abdominal:     Tenderness: There is right CVA tenderness and left CVA tenderness.  Skin:    General: Skin is warm and dry.     Capillary Refill: Capillary refill takes less than 2 seconds.     Findings: No erythema or rash.  Neurological:     General: No focal deficit present.     Mental Status: He is alert and oriented to person, place, and time.  Psychiatric:        Mood and Affect: Mood normal.  Behavior: Behavior normal.        Thought Content: Thought content normal.        Judgment: Judgment normal.     UC Treatments / Results  Labs (all labs ordered are listed, but only abnormal results are displayed) Labs Reviewed  URINALYSIS, COMPLETE (UACMP) WITH MICROSCOPIC - Abnormal; Notable for the following components:      Result Value   Glucose, UA >1,000 (*)    Hgb urine dipstick TRACE (*)    Protein, ur 30 (*)    Bacteria, UA FEW (*)    All other components within normal limits    EKG   Radiology No results found.  Procedures Procedures (including critical care time)  Medications Ordered in UC Medications - No data to display  Initial Impression / Assessment and Plan / UC Course  I have reviewed the triage vital signs and the nursing notes.  Pertinent labs & imaging results that were available during my care of the patient were reviewed by me and considered in my medical decision making (see chart for details).  40 year old male here for evaluation of back pain as outlined in the HPI above.  Patient's physical exam reveals a benign cardiopulmonary exam.  Patient does have tenderness to percussion of the costovertebral angle bilaterally though he also has tenderness with palpation of the muscles in the left and right paraspinous region with pain on the left being greater than the right.  This is  repeated in the lumbar paraspinous regions.  Patient has no midline spinal tenderness.  Urinalysis was collected at triage.  UA shows greater than 1000 glucose, trace hemoglobin, 30 protein, 6-10 squamous epithelials but 0-5 whites with no leukocyte esterase or nitrites.  Patient's elevated glucose in the urine is secondary to being on Jardiance which is an SGL 2 inhibitor.  Will treat patient for musculoskeletal back pain.  We will have him hold his tizanidine and replace it with baclofen 20 mg 3 times daily.  He is already using topical Voltaren gel and I will have him continue that as well 4 times a day.  He can also use over-the-counter lidocaine patches.   Final Clinical Impressions(s) / UC Diagnoses   Final diagnoses:  Acute bilateral back pain, unspecified back location     Discharge Instructions      Your urinalysis did not show any signs of infection.  Your exam is consistent with musculoskeletal back pain.  Use over-the-counter Voltaren gel every 6 hours to help with inflammation.  Take the baclofen 3 times a day to help with muscle spasm.  Hold her tizanidine while you take this.  You can also apply topical lidocaine patches to the area pain on your back.  You can use 1 patch every 8 hours.     ED Prescriptions     Medication Sig Dispense Auth. Provider   baclofen (LIORESAL) 20 MG tablet Take 1 tablet (20 mg total) by mouth 3 (three) times daily. 30 each Becky Augusta, NP      PDMP not reviewed this encounter.   Becky Augusta, NP 08/16/20 1331

## 2020-08-16 NOTE — Discharge Instructions (Addendum)
Your urinalysis did not show any signs of infection.  Your exam is consistent with musculoskeletal back pain.  Use over-the-counter Voltaren gel every 6 hours to help with inflammation.  Take the baclofen 3 times a day to help with muscle spasm.  Hold her tizanidine while you take this.  You can also apply topical lidocaine patches to the area pain on your back.  You can use 1 patch every 8 hours.

## 2020-10-12 ENCOUNTER — Telehealth: Payer: Self-pay | Admitting: Cardiovascular Disease

## 2020-10-12 MED ORDER — METOPROLOL TARTRATE 100 MG PO TABS: ORAL_TABLET | ORAL | 0 refills | Status: DC

## 2020-10-12 NOTE — Telephone Encounter (Signed)
Requested Prescriptions   Signed Prescriptions Disp Refills   metoprolol tartrate (LOPRESSOR) 100 MG tablet 180 tablet 0    Sig: TAKE 1 TABLET(100 MG) BY MOUTH TWICE DAILY    Authorizing Provider: Antonieta Iba    Ordering User: Kendrick Fries

## 2020-10-12 NOTE — Telephone Encounter (Signed)
*  STAT* If patient is at the pharmacy, call can be transferred to refill team.   1. Which medications need to be refilled? (please list name of each medication and dose if known) metoprolol 100 mg po BID   2. Which pharmacy/location (including street and city if local pharmacy) is medication to be sent to? [facility] [place]   3. Do they need a 30 day or 90 day supply? 90

## 2021-01-02 NOTE — Progress Notes (Signed)
Date:  01/03/2021   ID:  Cory Campbell, DOB 01/21/1981, MRN [id number]  Patient Location:  [facility] [place] [id number]   Provider location:   [facility], [place] office  PCP:  Donnie Coffin, MD  Cardiologist:  Patsy Baltimore  Chief Complaint  Patient presents with   6 month follow up     "Doing well." Medications reviewed by the patient verbally.     History of Present Illness:    AARNAV NEGLIA is a 40 y.o. male has a past medical history of Arthrogryposis multiplex congenita,  chronic pain in his legs,  asthma,  hypertension,  diabetes,  severe obstructive sleep apnea on CPAP since April 2012,  obesity,  seizure disorder,  psoriasis with several  evaluations in the emergency room for chest pain on April 23, 06/01/2011.  frequent falls, walks with leg braces and canes atypical chest pain, often exacerbated by falls Ostomy He presents for routine followup of his hypertension and chest pain, poorly controlled diabetes   Reports he is doing well, wife doing well Baby boy is now 17 months  Denies any recent falls No new complaints Denies chest pain or shortness of breath  He does feel his blood pressure continues to run high, diastolic pressures always over 100  Lab work reviewed Normal BMP A1c 6.3 CBC stable Total cholesterol 180, LDL 41, on Crestor  Followed by nephrology Medication list reviewed, some discrepancies between medication listed and what is talked about again To talk about lisinopril but list shows Delene Loll Did talk about Imdur but this is not on her medication list He did not bring a medication list with him  EKG personally reviewed by myself on todays visit Sinus tachycardia rate 107 bpm no significant ST-T wave changes  Of past medical history reviewed  emergency room May 05, 2018 Urinary tract infection Bilateral low back pain Treated with Pyridium, antibiotics, Diflucan  Fall  April 13, 2018 shoulder injury, seen in the ER shoulder pain Shoulder is doing better  Seen in the ER January 17, 2018 diarrhea abdominal pain Symptoms better  ostomy repair later in March 2019 at [facility]   Past Medical History:  Diagnosis Date   (HFpEF) heart failure with preserved ejection fraction ([place])    Abrasion or friction burn of foot and toe(s), without mention of infection    Acid reflux    Adopted    Allergic rhinitis, cause unspecified    Anxiety    Arthritis    Arthrogryposis    Asthma    Bladder wall thickening    BPH (benign prostatic hyperplasia)    Chronic pain syndrome    Depressive disorder, not elsewhere classified    Diabetes mellitus without complication ([place])    Diverticulitis of colon (without mention of hemorrhage)(562.11)    ED (erectile dysfunction)    Herpes genitalis    History of echocardiogram    a. 10/2016: EF 50-55%, normal wall motion   History of stress test    a. 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 or other cause, without mention of thyrotoxic crisis or storm    hyperthyroidism   TIA (transient ischemic attack)    Type II or unspecified type diabetes mellitus with neurological manifestations, not stated as uncontrolled(250.60)    Past Surgical History:  Procedure Laterality Date   APPENDECTOMY     COLOSTOMY     CORNEAL TRANSPLANT     feet surgery     both   HAND SURGERY     left and right   leg surgery     left and right   ORTHOPEDIC SURGERY     hands, feet, knees, legs   tubes in ears     both     Current Meds  Medication Sig   acyclovir (ZOVIRAX)  400 MG tablet Take 400 mg by mouth 2 (two) times daily.   albuterol (PROVENTIL HFA;VENTOLIN HFA) 108 (90 BASE) MCG/ACT inhaler Inhale 2 puffs into the lungs every 4 (four) hours as needed for wheezing or shortness of breath.    albuterol (PROVENTIL) (2.5 MG/3ML) 0.083% nebulizer solution Take 2.5 mg by nebulization every 4 (four) hours as needed for wheezing or shortness of breath.   alfuzosin (UROXATRAL) 10 MG 24 hr tablet Take 10 mg by mouth daily.   amLODipine (NORVASC) 10 MG tablet Take 10 mg by mouth daily.   aspirin EC 81 MG tablet Take 81 mg by mouth daily. Swallow whole.   BIKTARVY 50-200-25 MG TABS tablet Take 1 tablet by mouth daily.   buprenorphine (BUTRANS) 7.5 MCG/HR Place 7.5 mg onto the skin once a week. Saturday   cetirizine (ZYRTEC) 10 MG tablet Take 10 mg by mouth daily.   chlorthalidone (HYGROTON) 25 MG tablet Take 25 mg by mouth daily.   citalopram (CELEXA) 20 MG tablet Take 20 mg by mouth daily.   diclofenac sodium (VOLTAREN) 1 % GEL Apply 2-4 g topically 4 (four) times daily as needed. For pain.   famotidine (PEPCID) 20 MG tablet Take 20 mg by mouth daily.    feeding supplement, GLUCERNA SHAKE, (GLUCERNA SHAKE) LIQD Take 237 mLs by mouth 4 (four) times daily.   ferrous sulfate 325 (65 FE) MG tablet Take 325 mg by mouth 3 (three) times daily.   finasteride (PROSCAR) 5 MG tablet Take 5 mg by mouth daily.   fluticasone (FLONASE) 50 MCG/ACT nasal spray Place 2 sprays into both nostrils daily.   fluticasone (FLOVENT HFA) 110 MCG/ACT inhaler Inhale 2 puffs into the lungs 2 (two) times daily.   gabapentin (NEURONTIN) 300 MG capsule Take 300 mg by mouth at bedtime.   glucose 4 GM chewable tablet Chew 1 tablet by mouth as needed for low blood sugar.   insulin degludec (TRESIBA) 100 UNIT/ML FlexTouch Pen Inject 100 Units into the skin 2 (two) times daily.   JARDIANCE 10 MG TABS tablet Take 10 mg by mouth daily.   ketoconazole (NIZORAL) 2 % shampoo Apply 1 application topically 2  (two) times a week. tues and thursday   magnesium oxide (MAG-OX) 400 (241.3 Mg) MG tablet Take 1 tablet (400 mg total) by mouth daily.   Melatonin 5 MG TABS Take 10 mg by mouth at bedtime as needed (sleep).   metFORMIN (GLUCOPHAGE-XR) 500 MG 24 hr tablet Take 1,000 mg by mouth 2 (two) times daily.   metoprolol tartrate (LOPRESSOR) 100 MG tablet TAKE 1 TABLET(100 MG) BY MOUTH TWICE DAILY   mirtazapine (REMERON) 45 MG tablet Take 45 mg by mouth  at bedtime.   Multiple Vitamin (MULTIVITAMIN) tablet Take 1 tablet by mouth daily.   nortriptyline (PAMELOR) 25 MG capsule Take 50 mg by mouth at bedtime.   omega-3 acid ethyl esters (LOVAZA) 1 g capsule Take 1 g by mouth 3 (three) times daily.   omeprazole (PRILOSEC) 20 MG capsule Take 20 mg by mouth daily.   ondansetron (ZOFRAN-ODT) 4 MG disintegrating tablet Take 4 mg by mouth every 8 (eight) hours as needed for nausea or vomiting.   polyethylene glycol (MIRALAX / GLYCOLAX) 17 g packet Take 17 g by mouth 3 (three) times daily.   prednisoLONE acetate (PRED FORTE) 1 % ophthalmic suspension Place 1 drop into the right eye daily.    rosuvastatin (CRESTOR) 40 MG tablet TAKE 1 TABLET(40 MG) BY MOUTH DAILY   sacubitril-valsartan (ENTRESTO) 49-51 MG Take 1 tablet by mouth 2 (two) times daily. Start day AFTER complete Losartan for 1 week   solifenacin (VESICARE) 10 MG tablet Take 10 mg by mouth daily.   Spacer/Aero-Holding Chambers (AEROCHAMBER PLUS) inhaler Use as instructed   tiZANidine (ZANAFLEX) 4 MG capsule Take 1 capsule (4 mg total) by mouth 3 (three) times daily as needed for muscle spasms.   TRULICITY 1.5 0000000 SOPN Inject 1.5 mg into the skin once a week.   [DISCONTINUED] cloNIDine (CATAPRES - DOSED IN MG/24 HR) 0.2 mg/24hr patch Place 0.2 mg onto the skin once a week.   [DISCONTINUED] spironolactone (ALDACTONE) 25 MG tablet Take 50 mg by mouth 2 (two) times daily.   [DISCONTINUED] varenicline (CHANTIX) 1 MG tablet Take by mouth.     Allergies:    Penicillins and Erythromycin base   Social History   Tobacco Use   Smoking status: Former    Types: Pipe    Quit date: 01/2018    Years since quitting: 2.9   Smokeless tobacco: Never  Vaping Use   Vaping Use: Some days   Substances: Nicotine, Flavoring  Substance Use Topics   Alcohol use: Not Currently    Alcohol/week: 0.0 standard drinks    Comment: used to be moderate   Drug use: No     Family Hx: The patient's He was adopted. Family history is unknown by patient.  ROS:   Please see the history of present illness.    Review of Systems  Constitutional: Negative.   Respiratory: Negative.    Cardiovascular: Negative.   Gastrointestinal: Negative.   Musculoskeletal: Negative.        Falls, leg weakness  Neurological: Negative.   Psychiatric/Behavioral: Negative.    All other systems reviewed and are negative.   Labs/Other Tests and Data Reviewed:    Recent Labs: 03/29/2020: ALT 24 06/14/2020: Hemoglobin 15.3; Platelets 210 07/07/2020: BUN 16; Creatinine, Ser 0.99; Potassium 4.8; Sodium 139   Recent Lipid Panel Lab Results  Component Value Date/Time   CHOL 163 10/07/2016 05:03 AM   TRIG 126 10/07/2016 05:03 AM   HDL 26 (L) 10/07/2016 05:03 AM   CHOLHDL 6.3 10/07/2016 05:03 AM   LDLCALC 112 (H) 10/07/2016 05:03 AM    Wt Readings from Last 3 Encounters:  01/03/21 185 lb (83.9 kg)  08/13/20 195 lb 1.7 oz (88.5 kg)  07/07/20 195 lb (88.5 kg)     Exam:    Vital Signs: Vital signs may also be detailed in the HPI BP (!) 152/120 (BP Location: Left Arm, Patient Position: Sitting, Cuff Size: Normal)   Pulse (!) 107   Ht 5\' 6"  (1.676 m)   Wt 185 lb (83.9  kg) Comment: Patient @ home weight.  SpO2 95%   BMI 29.86 kg/m   Constitutional:  oriented to person, place, and time. No distress.  In a wheelchair, HENT:  Head: Grossly normal Eyes:  no discharge. No scleral icterus.  Neck: No JVD, no carotid bruits  Cardiovascular: Regular rate and rhythm, no murmurs  appreciated Pulmonary/Chest: Clear to auscultation bilaterally, no wheezes or rails Abdominal: Soft.  no distension.  no tenderness.  Musculoskeletal: Normal range of motion, decreased mobility legs Neurological:  normal muscle tone. Coordination normal. No atrophy full exam not performed Skin: Skin warm and dry Psychiatric: normal affect, pleasant   ASSESSMENT & PLAN:    Type 2 diabetes mellitus with complication, without long-term current use of insulin (HCC) Significant provement in A1c, diet has changed, now taking his medications he reports  Chest pain, unspecified type Previously healthy musculoskeletal chest wall pain Denies any new symptoms, no further ischemic work-up needed  Frequent falls Long history of falls, injury Denies any recent falls  Essential hypertension On numerous blood pressure medications, different physicians making changes Appears he is on Entresto at this time.  Unclear who started this, nephrology note showing Entresto on his list but they talked about lisinopril in the assessment and plan They also detail he is on Imdur but this is not on the medication list We will increase Imdur up to 60 presuming he is taking a 30 mg Continue metoprolol tartrate 150, we had him on 100 Recommend he check our list with what he has at home and call us with any discrepancies Continue to monitor blood pressure and call us with numbers  Obstructive sleep apnea on CPAP Stressed compliance with his CPAP  Hyperlipidemia On Crestor, Lipids slightly above goal   Total encounter time more than 25 minutes  Greater than 50% was spent in counseling and coordination of care with the patient      Signed, Julien Nordmann, MD  01/03/2021 8:53 AM    [facility] [address], [place], [place] [address]

## 2021-01-03 ENCOUNTER — Other Ambulatory Visit: Payer: Self-pay

## 2021-01-03 ENCOUNTER — Encounter: Payer: Self-pay | Admitting: Cardiovascular Disease

## 2021-01-03 ENCOUNTER — Ambulatory Visit (INDEPENDENT_AMBULATORY_CARE_PROVIDER_SITE_OTHER): Payer: Medicaid Other | Admitting: Cardiovascular Disease

## 2021-01-03 ENCOUNTER — Telehealth: Payer: Self-pay | Admitting: Cardiovascular Disease

## 2021-01-03 VITALS — BP 152/120 | HR 107 | Ht 66.0 in | Wt 185.0 lb

## 2021-01-03 DIAGNOSIS — E118 Type 2 diabetes mellitus with unspecified complications: Secondary | ICD-10-CM | POA: Diagnosis not present

## 2021-01-03 DIAGNOSIS — E1159 Type 2 diabetes mellitus with other circulatory complications: Secondary | ICD-10-CM

## 2021-01-03 DIAGNOSIS — G4733 Obstructive sleep apnea (adult) (pediatric): Secondary | ICD-10-CM | POA: Diagnosis not present

## 2021-01-03 DIAGNOSIS — Z9989 Dependence on other enabling machines and devices: Secondary | ICD-10-CM

## 2021-01-03 DIAGNOSIS — I10 Essential (primary) hypertension: Secondary | ICD-10-CM | POA: Diagnosis not present

## 2021-01-03 DIAGNOSIS — R000 Tachycardia, unspecified: Secondary | ICD-10-CM

## 2021-01-03 DIAGNOSIS — I152 Hypertension secondary to endocrine disorders: Secondary | ICD-10-CM

## 2021-01-03 MED ORDER — METOPROLOL TARTRATE 100 MG PO TABS: 150.0000 mg | ORAL_TABLET | Freq: Two times a day (BID) | ORAL | 3 refills | Status: DC

## 2021-01-03 MED ORDER — ISOSORBIDE MONONITRATE ER 60 MG PO TB24: 60.0000 mg | ORAL_TABLET | Freq: Every day | ORAL | 3 refills | Status: DC

## 2021-01-03 NOTE — Telephone Encounter (Signed)
Pt c/o medication issue:  1. Name of Medication: metoprolol   2. How are you currently taking this medication (dosage and times per day)? New changes need clarity   3. Are you having a reaction (difficulty breathing--STAT)? no  4. What is your medication issue? Patient at pharmacy now

## 2021-01-03 NOTE — Patient Instructions (Addendum)
Medication Instructions:  Please INCREASE imdur /isosorbide up to 60 mg daily metoprolol tartrate 150 mg twice a day  Call office to report off what blood pressure medications you have at home so we can review for updated list.   If you need a refill on your cardiac medications before your next appointment, please call your pharmacy.    Lab work: No new labs needed  Testing/Procedures: No new testing needed  Follow-Up: At [facility], you and your health needs are our priority.  As part of our continuing mission to provide you with exceptional heart care, we have created designated Provider Care Teams.  These Care Teams include your primary Cardiologist (physician) and Advanced Practice Providers (APPs -  Physician Assistants and Nurse Practitioners) who all work together to provide you with the care you need, when you need it.  You will need a follow up appointment in 6 months  Providers on your designated Care Team:   Nicolasa Ducking, NP Eula Listen, PA-C Cadence Fransico Michael, [place]  COVID-19 Vaccine Information can be found at: [email or web address] For questions related to vaccine distribution or appointments, please email [email or web address] or call [phone].   Please monitor blood pressures and keep a log of your readings.  Call the clinic in 2 weeks with BP readings.   How to use a home blood pressure monitor. Be still. Measure at the same time every day. It's important to take the readings at the same time each day, such as morning and evening. Take reading approximately 1 1/2 to 2 hours after BP medications.   AVOID these things for 30 minutes before checking your blood pressure: Drinking caffeine. Drinking alcohol. Eating. Smoking. Exercising.

## 2021-01-03 NOTE — Telephone Encounter (Signed)
Order corrected and recent to pharmacy

## 2021-01-12 ENCOUNTER — Telehealth: Payer: Self-pay | Admitting: Cardiovascular Disease

## 2021-01-12 NOTE — Telephone Encounter (Signed)
Patient states that Isosorbide was not on his medication list at his last visit. Please call to discuss.

## 2021-01-12 NOTE — Telephone Encounter (Signed)
Attempted to reach back out to Mr. Demartin, unable to make contact by phone LMTCB  Dr. Windell Hummingbird OV notes 11/28  Followed by nephrology Medication list reviewed, some discrepancies between medication listed and what is talked about again To talk about lisinopril but list shows Sherryll Burger Did talk about Imdur but this is not on her medication list He did not bring a medication list with him  ASSESSMENT & PLAN:  Essential hypertension On numerous blood pressure medications, different physicians making changes Appears he is on Entresto at this time.  Unclear who started this, nephrology note showing Entresto on his list but they talked about lisinopril in the assessment and plan They also detail he is on Imdur but this is not on the medication list We will increase Imdur up to 60 presuming he is taking a 30 mg Continue metoprolol tartrate 150, we had him on 100 Recommend he check our list with what he has at home and call us with any discrepancies Continue to monitor blood pressure and call us with numbers  Dr. Mariah Milling increased IMDUR 30 mg to 60 mg at last visit d/t EMR showing pt was on 30 mg by nephrology.

## 2021-01-14 NOTE — Telephone Encounter (Signed)
Attempted to reach out to pt again to see if he understood VM left on Wed regarding IMDUR  Was unable to make contact  LDM on VM advised on IMDUR 60 mg daily from IMDUR 30 mg daily from nephrology per Dr. Mariah Milling  Advised to call back with any questions.

## 2021-02-17 ENCOUNTER — Emergency Department: Payer: Medicaid Other

## 2021-02-17 ENCOUNTER — Emergency Department
Admission: EM | Admit: 2021-02-17 | Discharge: 2021-02-17 | Disposition: A | Discharge status: Home / Self Care | Payer: Medicaid Other | Attending: Emergency Medicine | Admitting: Emergency Medicine

## 2021-02-17 DIAGNOSIS — J45909 Unspecified asthma, uncomplicated: Secondary | ICD-10-CM | POA: Insufficient documentation

## 2021-02-17 DIAGNOSIS — M25512 Pain in left shoulder: Secondary | ICD-10-CM | POA: Insufficient documentation

## 2021-02-17 DIAGNOSIS — S0990XA Unspecified injury of head, initial encounter: Secondary | ICD-10-CM | POA: Insufficient documentation

## 2021-02-17 DIAGNOSIS — M25511 Pain in right shoulder: Secondary | ICD-10-CM | POA: Insufficient documentation

## 2021-02-17 DIAGNOSIS — W19XXXA Unspecified fall, initial encounter: Secondary | ICD-10-CM | POA: Diagnosis not present

## 2021-02-17 DIAGNOSIS — E119 Type 2 diabetes mellitus without complications: Secondary | ICD-10-CM | POA: Insufficient documentation

## 2021-02-17 DIAGNOSIS — I110 Hypertensive heart disease with heart failure: Secondary | ICD-10-CM | POA: Insufficient documentation

## 2021-02-17 DIAGNOSIS — I509 Heart failure, unspecified: Secondary | ICD-10-CM | POA: Insufficient documentation

## 2021-02-17 MED ORDER — METOPROLOL TARTRATE 50 MG PO TABS
100.0000 mg | ORAL_TABLET | Freq: Once | ORAL | Status: AC
  Administered 2021-02-17: 100 mg via ORAL
  Filled 2021-02-17: qty 2

## 2021-02-17 MED ORDER — MORPHINE SULFATE (PF) 4 MG/ML IV SOLN
4.0000 mg | Freq: Once | INTRAVENOUS | Status: AC
  Administered 2021-02-17: 4 mg via INTRAVENOUS
  Filled 2021-02-17: qty 1

## 2021-02-17 NOTE — ED Provider Notes (Signed)
[facility] Provider Note    Event Date/Time   First MD Initiated Contact with Patient 02/17/21 1854     (approximate)   History   Chief Complaint Fall   HPI  Cory Campbell is a 41 y.o. male with possible history of arthrogryposis, hypertension, diabetes, asthma, seizures, CHF, HIV, and chronic pain syndrome who presents to the ED following fall.  Patient reports that his left leg brace did not lock into position, causing him to fall backwards into his closet.  He struck his head but denies losing consciousness, now complains of posterior headache along with pain to his bilateral shoulders.  He denies any pain in his chest, abdomen, or lower extremities.  He does not take any blood thinners and denies any numbness or weakness.     Physical Exam   Triage Vital Signs: ED Triage Vitals [02/17/21 1854]  Enc Vitals Group     BP      Pulse      Resp      Temp      Temp src      SpO2      Weight 184 lb (83.5 kg)     Height      Head Circumference      Peak Flow      Pain Score      Pain Loc      Pain Edu?      Excl. in [place]?     Most recent vital signs: There were no vitals filed for this visit.  Constitutional: Alert and oriented. Eyes: Conjunctivae are normal. Head: Atraumatic. Nose: No congestion/rhinnorhea. Mouth/Throat: Mucous membranes are moist.  Neck: Midline cervical spine tenderness to palpation noted. Cardiovascular: Normal rate, regular rhythm. Grossly normal heart sounds.  2+ radial pulses bilaterally. Respiratory: Normal respiratory effort.  No retractions. Lungs CTAB. Gastrointestinal: Soft and nontender. No distention. Musculoskeletal: No lower extremity tenderness nor edema.  Braces noted to bilateral lower extremities.  Diffuse tenderness to palpation noted to bilateral shoulders with no tenderness to palpation of bilateral elbows or wrist. Neurologic:  Normal speech and language. No gross focal neurologic deficits are  appreciated.    ED Results / Procedures / Treatments   Labs (all labs ordered are listed, but only abnormal results are displayed) Labs Reviewed - No data to display  ED ECG REPORT I, Blake Divine, the attending physician, personally viewed and interpreted this ECG.   Date: 02/17/2021  EKG Time: 19:01  Rate: 115  Rhythm: sinus tachycardia  Axis: Normal  Intervals:none  ST&T Change: Inferior Q waves, similar to previous   RADIOLOGY Bilateral shoulder x-rays reviewed by me with no fracture or dislocation.  PROCEDURES:  Critical Care performed: No  Procedures   MEDICATIONS ORDERED IN ED: Medications  morphine 4 MG/ML injection 4 mg (4 mg Intravenous Given 02/17/21 2049)     IMPRESSION / MDM / ASSESSMENT AND PLAN / ED COURSE  I reviewed the triage vital signs and the nursing notes.                              41 y.o. male with past medical history of arthrogryposis, hypertension, diabetes, asthma, CHF, seizures, chronic pain syndrome, and HIV who presents to the ED complaining of headache and bilateral shoulder pain after losing his balance and falling at home earlier this evening.  Differential diagnosis includes, but is not limited to, traumatic brain injury, cervical spine injury,  shoulder fracture, shoulder dislocation.  Patient is well-appearing and in no acute distress, alert and oriented with no focal neurologic deficits.  Given significant headache, we will check CT head, also check CT of his cervical spine given midline tenderness.  We will check x-rays of bilateral shoulders but no obvious deformity noted and patient neurovascularly intact distally to both arms.  No evidence of traumatic injury to his trunk or lower extremities.  We will treat symptomatically with IV morphine and reassess following imaging.  CT head and cervical spine are negative for acute process, x-rays of bilateral shoulders are also unremarkable.  Patient reports pain is improved and he  is appropriate for discharge home with outpatient follow-up with his PCP.  He was counseled to return to the ED for new worsening symptoms, patient agrees with plan.      FINAL CLINICAL IMPRESSION(S) / ED DIAGNOSES   Final diagnoses:  Fall, initial encounter  Injury of head, initial encounter     Rx / DC Orders   ED Discharge Orders     None        Note:  This document was prepared using Dragon voice recognition software and may include unintentional dictation errors.   Blake Divine, MD 02/17/21 2106

## 2021-02-17 NOTE — ED Notes (Signed)
E-signature pad unavailable - Pt verbalized understanding of D/C information - no additional concerns at this time.  

## 2021-02-17 NOTE — ED Triage Notes (Signed)
Pt arrived via EMS for fall. Pt denies LOC, per ems pt went hypotensive enroute and got of NS and his bp went up.

## 2021-03-10 ENCOUNTER — Encounter: Payer: Self-pay | Admitting: Cardiovascular Disease

## 2021-03-11 ENCOUNTER — Other Ambulatory Visit: Payer: Self-pay

## 2021-03-11 ENCOUNTER — Emergency Department
Admission: EM | Admit: 2021-03-11 | Discharge: 2021-03-11 | Disposition: A | Discharge status: Home / Self Care | Payer: Medicaid Other | Attending: Emergency Medicine | Admitting: Emergency Medicine

## 2021-03-11 ENCOUNTER — Emergency Department: Payer: Medicaid Other

## 2021-03-11 DIAGNOSIS — J45909 Unspecified asthma, uncomplicated: Secondary | ICD-10-CM | POA: Diagnosis not present

## 2021-03-11 DIAGNOSIS — R0602 Shortness of breath: Secondary | ICD-10-CM | POA: Insufficient documentation

## 2021-03-11 DIAGNOSIS — R079 Chest pain, unspecified: Secondary | ICD-10-CM | POA: Insufficient documentation

## 2021-03-11 DIAGNOSIS — E119 Type 2 diabetes mellitus without complications: Secondary | ICD-10-CM | POA: Insufficient documentation

## 2021-03-11 DIAGNOSIS — I110 Hypertensive heart disease with heart failure: Secondary | ICD-10-CM | POA: Diagnosis not present

## 2021-03-11 DIAGNOSIS — I509 Heart failure, unspecified: Secondary | ICD-10-CM | POA: Diagnosis not present

## 2021-03-11 DIAGNOSIS — R110 Nausea: Secondary | ICD-10-CM | POA: Insufficient documentation

## 2021-03-11 LAB — BASIC METABOLIC PANEL WITH GFR
Anion gap: 9 (ref 5–15)
BUN: 21 mg/dL — ABNORMAL HIGH (ref 6–20)
CO2: 23 mmol/L (ref 22–32)
Calcium: 9.7 mg/dL (ref 8.9–10.3)
Chloride: 102 mmol/L (ref 98–111)
Creatinine, Ser: 1.33 mg/dL — ABNORMAL HIGH (ref 0.61–1.24)
GFR, Estimated: >60 mL/min
Glucose, Bld: 103 mg/dL — ABNORMAL HIGH (ref 70–99)
Potassium: 3.9 mmol/L (ref 3.5–5.1)
Sodium: 134 mmol/L — ABNORMAL LOW (ref 135–145)

## 2021-03-11 LAB — CBC
HCT: 46.4 % (ref 39.0–52.0)
Hemoglobin: 15 g/dL (ref 13.0–17.0)
MCH: 28.7 pg (ref 26.0–34.0)
MCHC: 32.3 g/dL (ref 30.0–36.0)
MCV: 88.7 fL (ref 80.0–100.0)
Platelets: 251 10*3/uL (ref 150–400)
RBC: 5.23 MIL/uL (ref 4.22–5.81)
RDW: 13.2 % (ref 11.5–15.5)
WBC: 6.9 10*3/uL (ref 4.0–10.5)
nRBC: 0 % (ref 0.0–0.2)

## 2021-03-11 LAB — TROPONIN I (HIGH SENSITIVITY)
Troponin I (High Sensitivity): 11 ng/L (ref <18)
Troponin I (High Sensitivity): 10 ng/L (ref <18)

## 2021-03-11 LAB — D-DIMER, QUANTITATIVE: D-Dimer, Quant: 0.32 ug/mL-FEU (ref 0.00–0.50)

## 2021-03-11 NOTE — ED Notes (Signed)
Pt verbalized understanding of discharge instructions and follow-up care instructions. Pt advised if symptoms worsen or come back to return to ED. E-signature not available at this time. Pt went home via [facility] transport.

## 2021-03-11 NOTE — ED Notes (Signed)
Pt is currently c/o of centralized chest pain that he describes as a sharp pain of a 9 on a 0-10 pain scale. Pt denies any significant cardiac hx but does have a hx of HTN. Pt is also c/o of SOB, respirations are even, regular and unlabored.

## 2021-03-11 NOTE — ED Triage Notes (Signed)
Pt in via EMS from home with c/o CP since  800pm last pm. Pt was fine today during work but then the pain come back. Pt has BP patch on left arm and ibuprofen patch on the right arm. Pt was given 324mg  on asa at home, #20g to left hand  Pt is paraplegic and will need EMS transport home.

## 2021-03-11 NOTE — Discharge Instructions (Signed)
Your cardiac enzymes were normal and the test screening for blood clots called the D-dimer was also normal indicating that it is unlikely that you have a blood clot.  We are not sure exactly what is causing your pain it could be related to a muscle strain.  Please follow-up with your primary care provider.

## 2021-03-11 NOTE — ED Provider Notes (Signed)
[facility] Provider Note    Event Date/Time   First MD Initiated Contact with Patient 03/11/21 1951     (approximate)   History   Chest Pain   HPI  Cory Campbell is a 41 y.o. male with past medical history of heart failure, arthrogryposis, hypertension, diabetes, asthma, seizures and chronic pain who presents with chest pain.  Symptoms have been going on since yesterday.  He endorses intermittent sharp pain in the left side of his chest that is nonradiating.  Not exertional.  Last for minutes at a time when it comes on.  Associated with some nausea and shortness of breath.  Patient denies history of similar.  Patient normally gets around in his motorized wheelchair.  Occasionally uses crutches.  The patient denies hx of prior DVT/PE, unilateral leg pain/swelling, hormone use, recent surgery, hx of cancer, prolonged immobilization, or hemoptysis.      Past Medical History:  Diagnosis Date   (HFpEF) heart failure with preserved ejection fraction (HCC)    Abrasion or friction burn of foot and toe(s), without mention of infection    Acid reflux    Adopted    Allergic rhinitis, cause unspecified    Anxiety    Arthritis    Arthrogryposis    Asthma    Bladder wall thickening    BPH (benign prostatic hyperplasia)    Chronic pain syndrome    Depressive disorder, not elsewhere classified    Diabetes mellitus without complication ([place])    Diverticulitis of colon (without mention of hemorrhage)(562.11)    ED (erectile dysfunction)    Herpes genitalis    History of echocardiogram    a. 10/2016: EF 50-55%, normal wall motion   History of stress test    a. 06/2011: significant GI uptake artifact, no evidence of ischemia, EF 56%   HIV infection (HCC)    HTN (hypertension)    Hyperlipidemia    Hypertensive cardiomyopathy (HCC)    Hypogonadism in male    Myalgia and myositis, unspecified    OAB (overactive bladder)    Obesity    Other psoriasis     Premature baby    born premature   Seizure disorder ([place])    Sleep apnea    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 or other cause, without mention of thyrotoxic crisis or storm    hyperthyroidism   TIA (transient ischemic attack)    Type II or unspecified type diabetes mellitus with neurological manifestations, not stated as uncontrolled(250.60)     Patient Active Problem List   Diagnosis Date Noted   Acute asthma exacerbation 03/30/2020   Acute respiratory failure with hypoxia ([place]) 03/29/2020   HIV infection ([place])    Surgical wound, non healing    Acute colitis 05/20/2017   Colitis    Acute renal failure ([place]) 05/15/2017   Diarrhea 05/15/2017   Sepsis ([place]) 05/15/2017   Abdominal pain 11/25/2016   HIV (human immunodeficiency virus infection) ([place]) 11/19/2016   Hypotension 10/08/2016   Unstable angina ([place]) 10/06/2016   Shortness of breath 06/23/2015   Obesity 05/18/2014   Frequent falls 08/12/2013   Pain of left clavicle 08/12/2013   Tachycardia 01/21/2013   Chronic pain syndrome 08/12/2012   Obstructive sleep apnea on CPAP 06/07/2012   Chest pain 06/20/2011   Hypertension associated with diabetes ([place]) 06/20/2011   Type 2 diabetes mellitus with complication, without long-term current use of insulin ([place]) 06/20/2011   Diabetes mellitus ([place]) 06/20/2011   Asthma, chronic, unspecified asthma  severity, with acute exacerbation 01/18/2008   Hyperlipidemia associated with type 2 diabetes mellitus ([place]) 08/28/2006   Essential hypertension 06/27/2005     Physical Exam  Triage Vital Signs: ED Triage Vitals  Enc Vitals Group     BP 03/11/21 1705 (!) 146/109     Pulse Rate 03/11/21 1705 94     Resp 03/11/21 1705 18     Temp 03/11/21 1705 97.7 F (36.5 C)     Temp Source 03/11/21 1705 Oral     SpO2 03/11/21 1705 95 %     Weight 03/11/21 1703 184 lb (83.5 kg)     Height 03/11/21 1703 5\' 6"  (1.676 m)     Head Circumference --      Peak Flow --      Pain Score 03/11/21  1703 7     Pain Loc --      Pain Edu? --      Excl. in [place]? --     Most recent vital signs: Vitals:   03/11/21 1705 03/11/21 2019  BP: (!) 146/109 (!) 154/102  Pulse: 94 (!) 101  Resp: 18 17  Temp: 97.7 F (36.5 C)   SpO2: 95% 97%     General: Awake, no distress.  Dysmorphic lower extremities CV:  Good peripheral perfusion.  Resp:  Normal effort.  Lungs are clear Abd:  No distention.  Multiple prior surgical scars Neuro:             Awake, Alert, Oriented x 3  Other:     ED Results / Procedures / Treatments  Labs (all labs ordered are listed, but only abnormal results are displayed) Labs Reviewed  BASIC METABOLIC PANEL - Abnormal; Notable for the following components:      Result Value   Sodium 134 (*)    Glucose, Bld 103 (*)    BUN 21 (*)    Creatinine, Ser 1.33 (*)    All other components within normal limits  CBC  D-DIMER, QUANTITATIVE  TROPONIN I (HIGH SENSITIVITY)  TROPONIN I (HIGH SENSITIVITY)     EKG  EKG interpreted by myself, normal sinus rhythm, normal axis, normal intervals nonspecific ST segment flattening in aVL and inverted T waves in 1, flattening in V6   RADIOLOGY I reviewed the CXR which does not show any acute cardiopulmonary process; agree with radiology report     PROCEDURES:  Critical Care performed: No  .1-3 Lead EKG Interpretation Performed by: Rada Hay, MD Authorized by: Rada Hay, MD     Interpretation: abnormal     ECG rate assessment: tachycardic     Rhythm: sinus tachycardia     Ectopy: none     Conduction: normal    The patient is on the cardiac monitor to evaluate for evidence of arrhythmia and/or significant heart rate changes.   MEDICATIONS ORDERED IN ED: Medications - No data to display   IMPRESSION / MDM / [place] / ED COURSE  I reviewed the triage vital signs and the nursing notes.                              Differential diagnosis includes, but is not limited to, ACS,  pulmonary embolism, dissection, musculoskeletal, GERD  Patient is a 41 year old male with who is wheelchair-bound at baseline due to a chronic musculoskeletal condition who presents with intermittent sharp chest pain x2 days associated with shortness of breath.  Vital signs are  notable for mild tachycardia, low 100s but otherwise within normal limits.  Pain is sharp and intermittent, nonexertional really not typical for ACS type pain.  I reviewed the patient's EKG which shows sinus rhythm with some T wave flattening throughout and inverted T waves in lead aVL.  No obvious concerning ischemic changes.  Chest x-ray without acute process.  Given elevated heart rate and patient is poorly mobile do feel that PE should be evaluated for.  We will send a D-dimer.   D-dimer is negative and second troponin also negative.  Pain is not consistent with unstable angina.  Given his reassuring work-up I think he is appropriate for discharge and outpatient management.      FINAL CLINICAL IMPRESSION(S) / ED DIAGNOSES   Final diagnoses:  Chest pain, unspecified type     Rx / DC Orders   ED Discharge Orders     None        Note:  This document was prepared using Dragon voice recognition software and may include unintentional dictation errors.   Rada Hay, MD 03/11/21 2116

## 2021-03-11 NOTE — ED Triage Notes (Signed)
Pt to ED via [facility] from home. Pt reports sharp CP that started last night and has been intermittent. Pt states when he got home from work that pain was constant. Pt took nitro SL at home with relief. Pt hx angina, CHF and TIA.

## 2021-03-14 ENCOUNTER — Other Ambulatory Visit: Payer: Self-pay

## 2021-03-14 MED ORDER — NITROGLYCERIN 0.4 MG SL SUBL: 0.4000 mg | SUBLINGUAL_TABLET | SUBLINGUAL | 3 refills | Status: DC | PRN

## 2021-03-14 NOTE — Progress Notes (Signed)
Per Dr. Mariah Milling, okay to send in order for SL Nitro

## 2021-05-31 ENCOUNTER — Ambulatory Visit: Payer: Medicaid Other | Attending: Family Medicine

## 2021-05-31 DIAGNOSIS — M6281 Muscle weakness (generalized): Secondary | ICD-10-CM | POA: Diagnosis present

## 2021-05-31 DIAGNOSIS — R262 Difficulty in walking, not elsewhere classified: Secondary | ICD-10-CM | POA: Insufficient documentation

## 2021-05-31 NOTE — Therapy (Signed)
[facility] ?[place], [place], [address] ?Phone: [phone]   Fax:  [phone] ? ?Physical Therapy Evaluation ? ?Patient Details  ?Name: Cory Campbell ?MRN: [id number] ?Date of Birth: 1980-06-26 ?Referring Provider (PT): Dr. Nigel Bridgeman ? ? ?Encounter Date: 05/31/2021 ? ? PT End of Session - 06/01/21 1142   ? ? Visit Number 1   ? Number of Visits 1   ? Date for PT Re-Evaluation 05/31/21   ? Authorization Time Period 05/31/2021- 05/31/2021   ? PT Start Time 1400   ? PT Stop Time 1455   ? PT Time Calculation (min) 55 min   ? Activity Tolerance Patient tolerated treatment well   ? Behavior During Therapy [facility] for tasks assessed/performed   ? ?  ?  ? ?  ? ? ?Past Medical History:  ?Diagnosis Date  ? (HFpEF) heart failure with preserved ejection fraction ([place])   ? Abrasion or friction burn of foot and toe(s), without mention of infection   ? Acid reflux   ? Adopted   ? Allergic rhinitis, cause unspecified   ? Anxiety   ? Arthritis   ? Arthrogryposis   ? Asthma   ? Bladder wall thickening   ? BPH (benign prostatic hyperplasia)   ? Chronic pain syndrome   ? Depressive disorder, not elsewhere classified   ? Diabetes mellitus without complication ([place])   ? Diverticulitis of colon (without mention of hemorrhage)(562.11)   ? ED (erectile dysfunction)   ? Herpes genitalis   ? History of echocardiogram   ? a. 10/2016: EF 50-55%, normal wall motion  ? History of stress test   ? a. 06/2011: significant GI uptake artifact, no evidence of ischemia, EF 56%  ? HIV infection ([place])   ? HTN (hypertension)   ? Hyperlipidemia   ? Hypertensive cardiomyopathy ([place])   ? Hypogonadism in male   ? Myalgia and myositis, unspecified   ? OAB (overactive bladder)   ? Obesity   ? Other psoriasis   ? Premature baby   ? born premature  ? Seizure disorder ([place])   ? Sleep apnea   ? 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  hyperthyroidism  ? TIA (transient ischemic attack)   ? Type II or unspecified type diabetes mellitus with neurological manifestations, not stated as uncontrolled(250.60)   ? ? ?Past Surgical History:  ?Procedure Laterality Date  ? APPENDECTOMY    ? COLOSTOMY    ? CORNEAL TRANSPLANT    ? feet surgery    ? both  ? HAND SURGERY    ? left and right  ? leg surgery    ? left and right  ? ORTHOPEDIC SURGERY    ? hands, feet, knees, legs  ? tubes in ears    ? both  ? ? ?There were no vitals filed for this visit. ? ? ? Subjective Assessment - 05/31/21 1450   ? ? Subjective Patient reports he is here for a power wheelchair evaluation as his current w/c is no longer operating well with many issues. I am in need of new chair.   ? Pertinent History Cory Campbell is a 41 y.o. male with a past medical history significant for arthrogryposis multiplex congenita, hx of multiple abdominal surgeries, T2DM, HTN, COPD, Asthma, OSA, HIV, and HFpEF. Patient reports he has been non-ambulatory for years but does use forearm crutches to transfer. He  reports he has used a power wheelchair for years and in need of a new one due to his current power wheelchair not working well and has been greater than 5 yeears.   ? Limitations Lifting;Standing;Walking;House hold activities   ? How long can you sit comfortably? no issues   ? How long can you stand comfortably? Unable to stand except to transfer   ? How long can you walk comfortably? Patient reports unable to take steps except a few to pivot transfer   ? Patient Stated Goals I need a new power wheelchair so I can resume being independent in my home and out in the community   ? Currently in Pain? Yes   ? Pain Score 6    ? Pain Location --   back, Hip, shoulder, BLE pain.  ? Pain Orientation Right;Left   ? Pain Descriptors / Indicators Aching;Sore;Tightness   ? Pain Type Chronic pain   ? Pain Onset More than a month ago   ? Pain Frequency Constant   ? Aggravating Factors  Transfers, Prolonged  sitting   ? Pain Relieving Factors Rest, change position   ? Effect of Pain on Daily Activities Difficulty with ADL's   ? ?  ?  ? ?  ? ?PATIENT INFORMATION: ?This Evaluation form will serve as the LMN for the following suppliers: ? ?Supplier:  [employer] development [employer] ?Contact Person:  Rockney Ghee, Brooke Bonito.  ?Phone: [phone] ? ? ?Reason for Referral: Order for new Power wheelchair ?Patient/caregiver Goals: To obtain a new power wheelchair ?Patient was seen for face-to-face evaluation for new power wheelchair.  Also present was   Rockney Ghee, Brooke Bonito.  to discuss recommendations and wheelchair options.  Further paperwork was completed and sent to vendor.  Patient appears to qualify for power mobility device at this time per objective findings.   ?MEDICAL HISTORY:  Cory Campbell is a 41 y.o. male with a past medical history significant for arthrogryposis multiplex congenita, hx of multiple abdominal surgeries, T2DM, HTN, COPD, Asthma, OSA, HIV, and HFpEF ?Diagnosis:  Arthrogryposis Multiplex cogenita ?Primary Diagnosis Onset:  Birth ?[] Progressive Disease ?Relevant Past and Future Surgeries: none ?Height: 5'8" ?Weight: 184 lb.  ?Explain and recent changes or trends in weight: no recent changes ? ?Relevant History including falls:  Patient reports he is present today to try to work on receiving a replacement power wheelchair to replace his current chair which per his report is not functioning well. Patient is non-ambulatory and unable to successfully or consistently using a manual wheelchair due to progressive UE weakness.  ? ? ? ?  ?HOME ENVIRONMENT: ?[] House  [] Condo/town home  [x] Apartment  [] Assisted Living    ?[] Lives Alone [x]  Lives with Others  - Wife and 3 children                                                  ?Hours with caregiver: Wife also functions as his paid caregiver.   ?[x] Home is accessible to patient            ?Stairs  [] Yes []  No     ?Ramp [] Yes [] No ?Comments:  level entry  ? ?COMMUNITY  ADL: ?TRANSPORTATION: ?[] Car    ?[] Lucianne Lei    ?[x] Public Transportation    ?[] Adapted w/c Lift   ?[] Ambulance   ?[] Other:       ?[] Sits in  wheelchair during transport  ?Employment/School:     ?Specific requirements pertaining to mobility                                                     ?Other: Patient reports working part- time (when able) at [employer]                                 ?  ? ? ? ?FUNCTIONAL/SENSORY PROCESSING SKILLS:  ?Handedness:   [x] Right     [] Left    [] NA  Comments:                                 ?Functional Processing Skills for Wheeled Mobility ?[x] Processing Skills are adequate for safe wheelchair operation  ?Areas of concern than may interfere with safe operation of wheelchair Description of problem   ?[]  Attention to environment     ?[] Judgment     ?[]  Hearing  ?[]  Vision or visual processing    ?[] Motor Planning  ?[]  Fluctuations in Behavior                                                ?  ? ?VERBAL COMMUNICATION: ?[x] WFL receptive ?[x]  WFL expressive ?[x] Understandable  ?[] Difficult to understand  ?[] non-communicative ?[]  Uses an augmented communication device  ? ? ?CURRENT SEATING / MOBILITY: ?Current Mobility Base:  ? [] None ? [] Dependent ? [] Manual ? [] Scooter ? [x] Power   ?Type of Control:                      ?Manufacturer:     quickie                    Size:                         Age:   5+ years (per patient report)                         ?Current Condition of Mobility Base:                                                                                                                    ?Current Wheelchair components:      (uncertain- patient arrived to clinic in a transport w/c provided by hospital for today's eval and reports his current power wheelchair is non-functional.                                                                                                                             ?  Describe posture in present seating system:                                                                            ? ?SENSATION and SKIN ISSUES: ?Sensation ?[] Intact ?[x] Impaired ?[] Absent  ? ?Level of sensation:   Decreased sensation to light touch in B LE's ? ?

## 2021-06-05 ENCOUNTER — Emergency Department: Payer: Medicaid Other

## 2021-06-05 ENCOUNTER — Other Ambulatory Visit: Payer: Self-pay

## 2021-06-05 ENCOUNTER — Observation Stay
Admission: EM | Admit: 2021-06-05 | Discharge: 2021-06-06 | Disposition: A | Discharge status: Home / Self Care | Payer: Medicaid Other | Attending: Student in an Organized Health Care Education/Training Program | Admitting: Student in an Organized Health Care Education/Training Program

## 2021-06-05 DIAGNOSIS — Z7982 Long term (current) use of aspirin: Secondary | ICD-10-CM | POA: Diagnosis not present

## 2021-06-05 DIAGNOSIS — N179 Acute kidney failure, unspecified: Secondary | ICD-10-CM | POA: Diagnosis present

## 2021-06-05 DIAGNOSIS — R55 Syncope and collapse: Secondary | ICD-10-CM | POA: Diagnosis not present

## 2021-06-05 DIAGNOSIS — I959 Hypotension, unspecified: Secondary | ICD-10-CM | POA: Diagnosis not present

## 2021-06-05 DIAGNOSIS — Z7984 Long term (current) use of oral hypoglycemic drugs: Secondary | ICD-10-CM | POA: Diagnosis not present

## 2021-06-05 DIAGNOSIS — Z7985 Long-term (current) use of injectable non-insulin antidiabetic drugs: Secondary | ICD-10-CM | POA: Insufficient documentation

## 2021-06-05 DIAGNOSIS — Z79899 Other long term (current) drug therapy: Secondary | ICD-10-CM | POA: Insufficient documentation

## 2021-06-05 DIAGNOSIS — G40909 Epilepsy, unspecified, not intractable, without status epilepticus: Secondary | ICD-10-CM

## 2021-06-05 DIAGNOSIS — R519 Headache, unspecified: Secondary | ICD-10-CM

## 2021-06-05 DIAGNOSIS — J45909 Unspecified asthma, uncomplicated: Secondary | ICD-10-CM | POA: Diagnosis not present

## 2021-06-05 DIAGNOSIS — I10 Essential (primary) hypertension: Secondary | ICD-10-CM | POA: Diagnosis present

## 2021-06-05 DIAGNOSIS — E119 Type 2 diabetes mellitus without complications: Secondary | ICD-10-CM | POA: Diagnosis not present

## 2021-06-05 DIAGNOSIS — G4730 Sleep apnea, unspecified: Secondary | ICD-10-CM

## 2021-06-05 DIAGNOSIS — R42 Dizziness and giddiness: Principal | ICD-10-CM

## 2021-06-05 DIAGNOSIS — B20 Human immunodeficiency virus [HIV] disease: Secondary | ICD-10-CM | POA: Insufficient documentation

## 2021-06-05 DIAGNOSIS — Z21 Asymptomatic human immunodeficiency virus [HIV] infection status: Secondary | ICD-10-CM | POA: Diagnosis present

## 2021-06-05 DIAGNOSIS — Z8673 Personal history of transient ischemic attack (TIA), and cerebral infarction without residual deficits: Secondary | ICD-10-CM | POA: Insufficient documentation

## 2021-06-05 DIAGNOSIS — Z794 Long term (current) use of insulin: Secondary | ICD-10-CM | POA: Diagnosis not present

## 2021-06-05 DIAGNOSIS — I5030 Unspecified diastolic (congestive) heart failure: Secondary | ICD-10-CM | POA: Diagnosis not present

## 2021-06-05 DIAGNOSIS — Q688 Other specified congenital musculoskeletal deformities: Secondary | ICD-10-CM

## 2021-06-05 DIAGNOSIS — I110 Hypertensive heart disease with heart failure: Secondary | ICD-10-CM | POA: Insufficient documentation

## 2021-06-05 DIAGNOSIS — Z7901 Long term (current) use of anticoagulants: Secondary | ICD-10-CM | POA: Insufficient documentation

## 2021-06-05 DIAGNOSIS — Z87891 Personal history of nicotine dependence: Secondary | ICD-10-CM | POA: Insufficient documentation

## 2021-06-05 DIAGNOSIS — G894 Chronic pain syndrome: Secondary | ICD-10-CM | POA: Diagnosis present

## 2021-06-05 DIAGNOSIS — R531 Weakness: Secondary | ICD-10-CM

## 2021-06-05 LAB — URINALYSIS, ROUTINE W REFLEX MICROSCOPIC
Bilirubin Urine: NEGATIVE
Glucose, UA: >=500 mg/dL — AB
Hgb urine dipstick: NEGATIVE
Ketones, ur: NEGATIVE mg/dL
Leukocytes,Ua: NEGATIVE
Nitrite: NEGATIVE
Protein, ur: 30 mg/dL — AB
Specific Gravity, Urine: 1.022 (ref 1.005–1.030)
pH: 6 (ref 5.0–8.0)

## 2021-06-05 LAB — CBC
HCT: 48.9 % (ref 39.0–52.0)
Hemoglobin: 15.7 g/dL (ref 13.0–17.0)
MCH: 28.2 pg (ref 26.0–34.0)
MCHC: 32.1 g/dL (ref 30.0–36.0)
MCV: 87.8 fL (ref 80.0–100.0)
Platelets: 279 10*3/uL (ref 150–400)
RBC: 5.57 MIL/uL (ref 4.22–5.81)
RDW: 13.6 % (ref 11.5–15.5)
WBC: 6.9 10*3/uL (ref 4.0–10.5)
nRBC: 0 % (ref 0.0–0.2)

## 2021-06-05 LAB — BASIC METABOLIC PANEL
Anion gap: 12 (ref 5–15)
BUN: 22 mg/dL — ABNORMAL HIGH (ref 6–20)
CO2: 27 mmol/L (ref 22–32)
Calcium: 11.9 mg/dL — ABNORMAL HIGH (ref 8.9–10.3)
Chloride: 97 mmol/L — ABNORMAL LOW (ref 98–111)
Creatinine, Ser: 2 mg/dL — ABNORMAL HIGH (ref 0.61–1.24)
GFR, Estimated: 42 mL/min — ABNORMAL LOW (ref >60)
Glucose, Bld: 177 mg/dL — ABNORMAL HIGH (ref 70–99)
Potassium: 4.6 mmol/L (ref 3.5–5.1)
Sodium: 136 mmol/L (ref 135–145)

## 2021-06-05 LAB — TROPONIN I (HIGH SENSITIVITY)
Troponin I (High Sensitivity): 7 ng/L (ref <18)
Troponin I (High Sensitivity): 6 ng/L (ref <18)

## 2021-06-05 MED ORDER — ONDANSETRON HCL 4 MG/2ML IJ SOLN
4.0000 mg | Freq: Four times a day (QID) | INTRAMUSCULAR | Status: DC | PRN
Start: 2021-06-05 — End: 2021-06-06

## 2021-06-05 MED ORDER — METOCLOPRAMIDE HCL 5 MG/ML IJ SOLN
10.0000 mg | Freq: Once | INTRAMUSCULAR | Status: AC
  Administered 2021-06-05: 10 mg via INTRAVENOUS
  Filled 2021-06-05: qty 2

## 2021-06-05 MED ORDER — ONDANSETRON HCL 4 MG PO TABS: 4.0000 mg | ORAL_TABLET | Freq: Four times a day (QID) | ORAL | Status: DC | PRN

## 2021-06-05 MED ORDER — SODIUM CHLORIDE 0.9 % IV BOLUS
1000.0000 mL | Freq: Once | INTRAVENOUS | Status: AC
  Administered 2021-06-05: 1000 mL via INTRAVENOUS

## 2021-06-05 MED ORDER — INSULIN DEGLUDEC 100 UNIT/ML ~~LOC~~ SOPN: 10.0000 [IU] | PEN_INJECTOR | Freq: Two times a day (BID) | SUBCUTANEOUS | Status: DC

## 2021-06-05 MED ORDER — TIZANIDINE HCL 4 MG PO TABS
4.0000 mg | ORAL_TABLET | Freq: Three times a day (TID) | ORAL | Status: DC | PRN
  Administered 2021-06-06: 4 mg via ORAL
  Filled 2021-06-05: qty 2
  Filled 2021-06-05: qty 1
  Filled 2021-06-05: qty 2

## 2021-06-05 MED ORDER — SODIUM CHLORIDE 0.9% FLUSH
3.0000 mL | Freq: Two times a day (BID) | INTRAVENOUS | Status: DC
  Administered 2021-06-05 – 2021-06-06 (×2): 3 mL via INTRAVENOUS

## 2021-06-05 MED ORDER — ENOXAPARIN SODIUM 40 MG/0.4ML IJ SOSY
40.0000 mg | PREFILLED_SYRINGE | INTRAMUSCULAR | Status: DC
  Administered 2021-06-05: 40 mg via SUBCUTANEOUS
  Filled 2021-06-05: qty 0.4

## 2021-06-05 MED ORDER — DROPERIDOL 2.5 MG/ML IJ SOLN
2.5000 mg | Freq: Once | INTRAMUSCULAR | Status: AC
  Administered 2021-06-05: 2.5 mg via INTRAVENOUS
  Filled 2021-06-05: qty 2

## 2021-06-05 MED ORDER — ASPIRIN EC 81 MG PO TBEC
81.0000 mg | DELAYED_RELEASE_TABLET | Freq: Every day | ORAL | Status: DC
  Administered 2021-06-06: 81 mg via ORAL
  Filled 2021-06-05: qty 1

## 2021-06-05 MED ORDER — GABAPENTIN 300 MG PO CAPS
300.0000 mg | ORAL_CAPSULE | Freq: Every day | ORAL | Status: DC
  Administered 2021-06-05: 300 mg via ORAL
  Filled 2021-06-05: qty 1

## 2021-06-05 MED ORDER — ACYCLOVIR 200 MG PO CAPS
400.0000 mg | ORAL_CAPSULE | Freq: Two times a day (BID) | ORAL | Status: DC
  Administered 2021-06-05 – 2021-06-06 (×2): 400 mg via ORAL
  Filled 2021-06-05 (×2): qty 2

## 2021-06-05 MED ORDER — FAMOTIDINE 20 MG PO TABS
20.0000 mg | ORAL_TABLET | Freq: Every day | ORAL | Status: DC
  Administered 2021-06-06: 20 mg via ORAL
  Filled 2021-06-05: qty 1

## 2021-06-05 MED ORDER — DIPHENHYDRAMINE HCL 50 MG/ML IJ SOLN
12.5000 mg | Freq: Once | INTRAMUSCULAR | Status: AC
  Administered 2021-06-05: 12.5 mg via INTRAVENOUS
  Filled 2021-06-05: qty 1

## 2021-06-05 MED ORDER — CITALOPRAM HYDROBROMIDE 10 MG PO TABS
20.0000 mg | ORAL_TABLET | Freq: Every day | ORAL | Status: DC
  Administered 2021-06-06: 20 mg via ORAL
  Filled 2021-06-05: qty 2

## 2021-06-05 MED ORDER — MELATONIN 5 MG PO TABS: 10.0000 mg | ORAL_TABLET | Freq: Every evening | ORAL | Status: DC | PRN

## 2021-06-05 MED ORDER — SODIUM CHLORIDE 0.9 % IV SOLN: INTRAVENOUS | Status: DC

## 2021-06-05 MED ORDER — INSULIN GLARGINE-YFGN 100 UNIT/ML ~~LOC~~ SOLN
10.0000 [IU] | Freq: Two times a day (BID) | SUBCUTANEOUS | Status: DC
  Administered 2021-06-05: 10 [IU] via SUBCUTANEOUS
  Filled 2021-06-05 (×2): qty 0.1

## 2021-06-05 MED ORDER — BICTEGRAVIR-EMTRICITAB-TENOFOV 50-200-25 MG PO TABS
1.0000 | ORAL_TABLET | Freq: Every day | ORAL | Status: DC
  Administered 2021-06-06: 1 via ORAL
  Filled 2021-06-05: qty 1

## 2021-06-05 MED ORDER — MIRTAZAPINE 15 MG PO TABS
45.0000 mg | ORAL_TABLET | Freq: Every day | ORAL | Status: DC
  Administered 2021-06-05: 45 mg via ORAL
  Filled 2021-06-05: qty 3

## 2021-06-05 MED ORDER — NORTRIPTYLINE HCL 25 MG PO CAPS
50.0000 mg | ORAL_CAPSULE | Freq: Every day | ORAL | Status: DC
  Administered 2021-06-05: 50 mg via ORAL
  Filled 2021-06-05: qty 2

## 2021-06-05 NOTE — Assessment & Plan Note (Signed)
Secondary to antihypertensives and diuretics currently being held ?

## 2021-06-05 NOTE — Assessment & Plan Note (Signed)
On Biktarvy.  No acute issues suspected ?

## 2021-06-05 NOTE — ED Triage Notes (Signed)
Pt here with vertigo. Pt is wearing a pain (Butrans) and a clonidine patch on his chest. Pt only c/o headache pain.  ?

## 2021-06-05 NOTE — ED Triage Notes (Signed)
Pt comes ems from home with vertigo after church today. Symptoms of h/a and dizziness started about 1030. Took a tylenol. Pt is a diabetic was 171. Hypertensive with ems. Not on any thinners.  ?

## 2021-06-05 NOTE — Assessment & Plan Note (Signed)
CPAP nightly

## 2021-06-05 NOTE — ED Provider Notes (Addendum)
? ?[facility] ?Provider Note ? ? ? Event Date/Time  ? First MD Initiated Contact with Patient 06/05/21 1512   ?  (approximate) ? ? ?History  ? ?Weakness ? ? ?HPI ? ?Cory Campbell is a 41 y.o. male who comes in with vertigo after church with symptoms started around 10:30 AM.  Patient took a Tylenol.  Patient reports that he is in having intermittent stabbing headaches as well as some dizziness.  He states that he has had vertigo previously but has been on very long time.  He denies remember what medications he was on previously.  He denies any chest pain, shortness of breath, abdominal pain. ? ?On review of records patient has a history of seizures, diabetes, HIV, Arthrogryposis multiplex congenita.  I reviewed patient's office visit from March 2023 by the pain medicine clinic.  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  He is able to move the right leg slightly but the left leg is not able to.  He reports that this is at his baseline self. ? ? ?Physical Exam  ? ?Triage Vital Signs: ?ED Triage Vitals  ?Enc Vitals Group  ?   BP 06/05/21 1334 111/72  ?   Pulse Rate 06/05/21 1334 81  ?   Resp 06/05/21 1334 18  ?   Temp 06/05/21 1334 98 ?F (36.7 ?C)  ?   Temp Source 06/05/21 1334 Oral  ?   SpO2 06/05/21 1334 97 %  ?   Weight 06/05/21 1330 184 lb (83.5 kg)  ?   Height 06/05/21 1330 5\' 6"  (1.676 m)  ?   Head Circumference --   ?   Peak Flow --   ?   Pain Score 06/05/21 1330 9  ?   Pain Loc --   ?   Pain Edu? --   ?   Excl. in [place]? --   ? ? ?Most recent vital signs: ?Vitals:  ? 06/05/21 1334  ?BP: 111/72  ?Pulse: 81  ?Resp: 18  ?Temp: 98 ?F (36.7 ?C)  ?SpO2: 97%  ? ? ? ?General: Awake, no distress.  ?CV:  Good peripheral perfusion.  ?Resp:  Normal effort.  ?Abd:  No distention.  Soft nontender ?Other:  Braces noted on bilateral legs with difficulty moving the left leg but able to lift the right leg slightly up off the bed.  Baseline  for patient.  Patient has equal grip strength and finger-to-nose intact bilaterally.  Extraocular movements are intact with contacts noted bilaterally.  Cranial nerves otherwise appear normal. ? ? ?ED Results / Procedures / Treatments  ? ?Labs ?(all labs ordered are listed, but only abnormal results are displayed) ?Labs Reviewed  ?BASIC METABOLIC PANEL - Abnormal; Notable for the following components:  ?    Result Value  ? Chloride 97 (*)   ? Glucose, Bld 177 (*)   ? BUN 22 (*)   ? Creatinine, Ser 2.00 (*)   ? Calcium 11.9 (*)   ? GFR, Estimated 42 (*)   ? All other components within normal limits  ?CBC  ?URINALYSIS, ROUTINE W REFLEX MICROSCOPIC  ?CBG MONITORING, ED  ?TROPONIN I (HIGH SENSITIVITY)  ?TROPONIN I (HIGH SENSITIVITY)  ? ? ? ?EKG ? ?My interpretation of EKG: ? ?Normal sinus rate of 84 without any ST elevation, T wave inversion in aVL, normal intervals ? ?RADIOLOGY ?I have reviewed the CT  personally - no ICH ? ?Xray- no  pneumonia  ? ?PROCEDURES: ? ?Critical Care performed: No ? ?.1-3 Lead EKG Interpretation ?Performed by: Concha Se, MD ?Authorized by: Concha Se, MD  ? ?  Interpretation: normal   ?  ECG rate:  70 ?  ECG rate assessment: normal   ?  Rhythm: sinus rhythm   ?  Ectopy: none   ?  Conduction: normal   ? ? ?MEDICATIONS ORDERED IN ED: ?Medications  ?diphenhydrAMINE (BENADRYL) injection 12.5 mg (12.5 mg Intravenous Given 06/05/21 1543)  ?sodium chloride 0.9 % bolus 1,000 mL (0 mLs Intravenous Stopped 06/05/21 1801)  ?metoCLOPramide (REGLAN) injection 10 mg (10 mg Intravenous Given 06/05/21 1544)  ?droperidol (INAPSINE) 2.5 MG/ML injection 2.5 mg (2.5 mg Intravenous Given 06/05/21 1846)  ? ? ? ?IMPRESSION / MDM / ASSESSMENT AND PLAN / ED COURSE  ?I reviewed the triage vital signs and the nursing notes. ? ?Patient comes in with headaches with dizziness.  Will get CT head to evaluate for intercranial hemorrhage.  Patient does look dehydrated so given some fluids and migraine cocktail.  Does not  sound like an acute stroke but will continue to closely monitor to see if MRI is necessary if patient symptoms are not resolving.  Patient reports being compliant with his HIV and be nondetectable so doubt that this is an AIDS defining illness.  Headache was not sudden and severe in onset so doubt subarachnoid and would suspect CT to be positive given onset was today ? ? ?White count normal ?Trope negative ?BMP does show slightly elevated creatinine we will give some IV fluids. ? ?I went to reevaluate patient he was sleeping in the bed but when he wakes up he states that he still having a headache.  He does report that the headache is been gradually getting worse. ? ? ?  ?IMPRESSION: ?1. 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 No definite gray matter heterotopia is ?seen. ?  ?2. No evidence of acute or subacute infarct. No acute intracranial ?process. ? ?I discussed with radiologist about MRI findings but these sound more chronic in nature and something that patient is born with.  They were seen on prior MRI in 2013 but due to technology that it is more apparent now.  I did discuss these finding with patient but do not feel that they are probably contributing to his headaches. ? ?Repeat assessment patient reports headache is better but he still feeling weak and slightly dizzy.  I suspect this is from his AKI causing his low blood pressure given patient is normally hypertensive.  His clonidine patch was removed.  We discussed going home but patient did not feel comfortable and was preferring admission for dehydration, AKI ? ? ?The patient is on the cardiac monitor to evaluate for evidence of arrhythmia and/or significant heart rate changes. ? ?  ? ? ?FINAL CLINICAL IMPRESSION(S) / ED DIAGNOSES  ? ?Final diagnoses:  ?Weakness  ?AKI (acute kidney injury) (HCC)  ? ? ? ?Rx / DC Orders  ? ?ED Discharge Orders    ? ? None  ? ?  ? ? ? ?Note:  This document was prepared using Dragon voice recognition software and may include unintentional dictation errors. ?  ?Concha Se, MD ?06/05/21 2148 ? ?  ?Concha Se, MD ?06/05/21 2149 ? ?

## 2021-06-05 NOTE — Assessment & Plan Note (Signed)
Followed by the pain clinic.  On Butrans patch, Neurontin, Remeron, Pamelor and Zanaflex ?

## 2021-06-05 NOTE — ED Notes (Signed)
Pt in mri still at this time ?

## 2021-06-05 NOTE — Assessment & Plan Note (Signed)
Continue Neurontin. 

## 2021-06-05 NOTE — H&P (Signed)
?History and Physical  ? ? ?Patient: Cory Campbell EXN:[id number] DOB: 1980-10-27 ?DOA: 06/05/2021 ?DOS: the patient was seen and examined on 06/05/2021 ?PCP: Emogene Morgan, MD  ?Patient coming from: Home ? ?Chief Complaint:  ?Chief Complaint  ?Patient presents with  ? Weakness  ? ? ?HPI: Cory Campbell is a 41 y.o. male with medical history significant for atrial fibrillation, HIV, IDT2DM, HTN, asthma, HLD, chronic pain syndrome, arthrogryposis with resultant osteoarthritis, contractures, and paraplegia, multiple abdominal surgeries (s/p ileostomy takedown May 2019), seizure disorder, BPH, depression, OSA on CPAP , Noted to be on medications for CHF and CAD but without these diagnoses on review of last cardiology note from 01/03/2021 who presents to the ED with an episode of dizziness while at church that started around 10:30 AM associated with headache.  He denied one-sided numbness weakness tingling or slurred speech. ?ED course and data review: On arrival he was hypotensive at 90/70, going as low as 83/62 with otherwise normal vitals.  O2 sat on room air transiently dipped as low as 86% but was otherwise in the 90s on room air.  Blood work significant for normal CBC, creatinine of 2.0 above baseline of 0.8.  Troponin of 6.  Urinalysis was unremarkable.  EKG, personally viewed and interpreted showing sinus rhythm at 84 with nonspecific ST-T wave changes.  Chest x-ray showed no acute disease.  MRI brain showed the following ?IMPRESSION: ?1. 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 No definite gray matter heterotopia is ?seen. ?  ?2. No evidence of acute or subacute infarct. No acute intracranial ?process. ?Patient was given an IV fluid bolus, Reglan Benadryl and droperidol for headache and hospitalist consulted for admission.  ? ?Review of Systems: As mentioned in the history of present illness. All  other systems reviewed and are negative. ?Past Medical History:  ?Diagnosis Date  ? (HFpEF) heart failure with preserved ejection fraction (HCC)   ? Abrasion or friction burn of foot and toe(s), without mention of infection   ? Acid reflux   ? Adopted   ? Allergic rhinitis, cause unspecified   ? Anxiety   ? Arthritis   ? Arthrogryposis   ? Asthma   ? Bladder wall thickening   ? BPH (benign prostatic hyperplasia)   ? Chronic pain syndrome   ? Depressive disorder, not elsewhere classified   ? Diabetes mellitus without complication (HCC)   ? Diverticulitis of colon (without mention of hemorrhage)(562.11)   ? ED (erectile dysfunction)   ? Herpes genitalis   ? History of echocardiogram   ? a. 10/2016: EF 50-55%, normal wall motion  ? History of stress test   ? a. 06/2011: significant GI uptake artifact, no evidence of ischemia, EF 56%  ? HIV infection (HCC)   ? HTN (hypertension)   ? Hyperlipidemia   ? Hypertensive cardiomyopathy (HCC)   ? Hypogonadism in male   ? Myalgia and myositis, unspecified   ? OAB (overactive bladder)   ? Obesity   ? Other psoriasis   ? Premature baby   ? born premature  ? Seizure disorder (HCC)   ? Sleep apnea   ? 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 hyperthyroidism  ? TIA (transient ischemic attack)   ? Type II or unspecified type diabetes mellitus with neurological manifestations, not stated as uncontrolled(250.60)   ? ?Past Surgical History:  ?Procedure  Laterality Date  ? APPENDECTOMY    ? COLOSTOMY    ? CORNEAL TRANSPLANT    ? feet surgery    ? both  ? HAND SURGERY    ? left and right  ? leg surgery    ? left and right  ? ORTHOPEDIC SURGERY    ? hands, feet, knees, legs  ? tubes in ears    ? both  ? ?Social History:  reports that he quit smoking about 3 years ago. His smoking use included pipe. He has never used smokeless tobacco. He reports that he does not currently use alcohol. He reports that he does not use  drugs. ? ?Allergies  ?Allergen Reactions  ? Penicillins Hives, Itching and Rash  ?  Has patient had a PCN reaction causing immediate rash, facial/tongue/throat swelling, SOB or lightheadedness with hypotension: Yes ?Has patient had a PCN reaction causing severe rash involving mucus membranes or skin necrosis: No ?Has patient had a PCN reaction that required hospitalization No ?Has patient had a PCN reaction occurring within the last 10 years: No ?If all of the above answers are "NO", then may proceed with Cephalosporin use. ?  ? Erythromycin Base Itching and Rash  ? ? ?Family History  ?Adopted: Yes  ?Family history unknown: Yes  ? ? ?Prior to Admission medications   ?Medication Sig Start Date End Date Taking? Authorizing Provider  ?acyclovir (ZOVIRAX) 400 MG tablet Take 400 mg by mouth 2 (two) times daily.   Yes [provider]  ?alfuzosin (UROXATRAL) 10 MG 24 hr tablet Take 10 mg by mouth daily.   Yes [provider]  ?amLODipine (NORVASC) 10 MG tablet Take 10 mg by mouth daily.   Yes [provider]  ?aspirin EC 81 MG tablet Take 81 mg by mouth daily. Swallow whole.   Yes [provider]  ?BIKTARVY 50-200-25 MG TABS tablet Take 1 tablet by mouth daily. 11/21/16  Yes [provider]  ?cetirizine (ZYRTEC) 10 MG tablet Take 10 mg by mouth daily.   Yes [provider]  ?chlorthalidone (HYGROTON) 25 MG tablet Take 25 mg by mouth daily.   Yes [provider]  ?citalopram (CELEXA) 20 MG tablet Take 20 mg by mouth daily.   Yes [provider]  ?dicyclomine (BENTYL) 10 MG capsule Take 20 mg by mouth 2 (two) times daily before a meal. 05/01/21  Yes [provider]  ?empagliflozin (JARDIANCE) 25 MG TABS tablet Take 25 mg by mouth daily. 01/19/20  Yes [provider]  ?famotidine (PEPCID) 20 MG tablet Take 20 mg by mouth daily.  01/03/16  Yes [provider]  ?ferrous sulfate 325 (65 FE) MG tablet Take 325 mg by mouth 3 (three)  times daily.   Yes [provider]  ?finasteride (PROSCAR) 5 MG tablet Take 5 mg by mouth daily.   Yes [provider]  ?fluticasone (FLONASE) 50 MCG/ACT nasal spray Place 2 sprays into both nostrils daily.   Yes [provider]  ?gabapentin (NEURONTIN) 300 MG capsule Take 300 mg by mouth at bedtime.   Yes [provider]  ?insulin degludec (TRESIBA) 100 UNIT/ML FlexTouch Pen Inject 100 Units into the skin 2 (two) times daily.   Yes [provider]  ?isosorbide mononitrate (IMDUR) 60 MG 24 hr tablet Take 1 tablet (60 mg total) by mouth daily. 01/03/21  Yes Antonieta IbaGollan, Timothy J, MD  ?ketoconazole (NIZORAL) 2 % shampoo Apply 1 application topically 2 (two) times a week. tues and thursday 08/27/14  Yes [provider]  ?Melatonin 5 MG TABS Take 10 mg by mouth at bedtime as needed (sleep).   Yes [provider]  ?metFORMIN (GLUCOPHAGE-XR) 500 MG 24 hr tablet Take 1,000 mg by mouth 2 (two) times daily.   Yes [provider]  ?metoprolol tartrate (LOPRESSOR) 100 MG tablet Take 1.5 tablets (150 mg total) by mouth 2 (two) times daily. 01/03/21  Yes Antonieta Iba, MD  ?mirtazapine (REMERON) 45 MG tablet Take 45 mg by mouth at bedtime.   Yes [provider]  ?Multiple Vitamin (MULTIVITAMIN) tablet Take 1 tablet by mouth daily.   Yes [provider]  ?nortriptyline (PAMELOR) 25 MG capsule Take 50 mg by mouth at bedtime.   Yes [provider]  ?omega-3 acid ethyl esters (LOVAZA) 1 g capsule Take 1 g by mouth 3 (three) times daily.   Yes [provider]  ?omeprazole (PRILOSEC) 20 MG capsule Take 20 mg by mouth daily.   Yes [provider]  ?polyethylene glycol (MIRALAX / GLYCOLAX) 17 g packet Take 17 g by mouth 3 (three) times daily.   Yes [provider]  ?prednisoLONE acetate (PRED FORTE) 1 % ophthalmic suspension Place 1 drop into the right eye daily.    Yes [provider]  ?rosuvastatin  (CRESTOR) 40 MG tablet TAKE 1 TABLET(40 MG) BY MOUTH DAILY 04/30/19  Yes Gollan, Tollie Pizza, MD  ?sacubitril-valsartan (ENTRESTO) 49-51 MG Take 1 tablet by mouth 2 (two) times daily. Start day AFTER complete Losartan f

## 2021-06-05 NOTE — Assessment & Plan Note (Addendum)
Suspect related to hypotension and dehydration from multiple antihypertensives including amlodipine, chlorthalidone, clonidine, Imdur, metoprolol and Entresto ?Patient also on multiple pain meds and muscle relaxants including Neurontin, Zanaflex, Butrans patch ?Hold related antihypertensives.  Will hold muscle relaxants for tonight ?IV hydration and monitor ?Neurologic checks ?MRI was negative for CVA.  Will defer further stroke work-up ?

## 2021-06-05 NOTE — Assessment & Plan Note (Addendum)
MRI with no acute finding.  Suspect related to hypotension ?Hydration and Tylenol prn ?

## 2021-06-05 NOTE — Assessment & Plan Note (Addendum)
Blood sugar controlled.  Sliding scale coverage ?

## 2021-06-05 NOTE — Assessment & Plan Note (Signed)
Secondary to dehydration from diuretic therapy ?Gentle hydration and resume chlorthalidone as appropriate ?

## 2021-06-05 NOTE — ED Notes (Signed)
Pt asleep - when awakened pt states that his head is really hurting. Awaiting med from pharmacy. ?

## 2021-06-05 NOTE — Assessment & Plan Note (Signed)
Hold BP lowering meds due to hypotension.  Includes amlodipine, chlorthalidone, clonidine, Imdur, metoprolol and Entresto.  Resume as appropriate ?

## 2021-06-05 NOTE — Assessment & Plan Note (Signed)
Continue pain meds and muscle relaxants with neurologic checks ?

## 2021-06-06 ENCOUNTER — Encounter: Payer: Self-pay | Admitting: Internal Medicine

## 2021-06-06 DIAGNOSIS — N179 Acute kidney failure, unspecified: Secondary | ICD-10-CM

## 2021-06-06 DIAGNOSIS — Q688 Other specified congenital musculoskeletal deformities: Secondary | ICD-10-CM | POA: Diagnosis not present

## 2021-06-06 DIAGNOSIS — R531 Weakness: Secondary | ICD-10-CM

## 2021-06-06 DIAGNOSIS — R42 Dizziness and giddiness: Secondary | ICD-10-CM

## 2021-06-06 DIAGNOSIS — B20 Human immunodeficiency virus [HIV] disease: Secondary | ICD-10-CM

## 2021-06-06 DIAGNOSIS — I952 Hypotension due to drugs: Secondary | ICD-10-CM

## 2021-06-06 DIAGNOSIS — G894 Chronic pain syndrome: Secondary | ICD-10-CM

## 2021-06-06 DIAGNOSIS — Z794 Long term (current) use of insulin: Secondary | ICD-10-CM

## 2021-06-06 DIAGNOSIS — G40909 Epilepsy, unspecified, not intractable, without status epilepticus: Secondary | ICD-10-CM

## 2021-06-06 DIAGNOSIS — E119 Type 2 diabetes mellitus without complications: Secondary | ICD-10-CM

## 2021-06-06 DIAGNOSIS — G4730 Sleep apnea, unspecified: Secondary | ICD-10-CM

## 2021-06-06 DIAGNOSIS — I10 Essential (primary) hypertension: Secondary | ICD-10-CM

## 2021-06-06 DIAGNOSIS — R55 Syncope and collapse: Secondary | ICD-10-CM

## 2021-06-06 DIAGNOSIS — G44209 Tension-type headache, unspecified, not intractable: Secondary | ICD-10-CM

## 2021-06-06 LAB — BASIC METABOLIC PANEL
Anion gap: 10 (ref 5–15)
BUN: 21 mg/dL — ABNORMAL HIGH (ref 6–20)
CO2: 29 mmol/L (ref 22–32)
Calcium: 10.7 mg/dL — ABNORMAL HIGH (ref 8.9–10.3)
Chloride: 100 mmol/L (ref 98–111)
Creatinine, Ser: 1.68 mg/dL — ABNORMAL HIGH (ref 0.61–1.24)
GFR, Estimated: 52 mL/min — ABNORMAL LOW (ref >60)
Glucose, Bld: 175 mg/dL — ABNORMAL HIGH (ref 70–99)
Potassium: 3.8 mmol/L (ref 3.5–5.1)
Sodium: 139 mmol/L (ref 135–145)

## 2021-06-06 LAB — GLUCOSE, CAPILLARY: Glucose-Capillary: 92 mg/dL (ref 70–99)

## 2021-06-06 MED ORDER — INSULIN GLARGINE-YFGN 100 UNIT/ML ~~LOC~~ SOLN
20.0000 [IU] | Freq: Every day | SUBCUTANEOUS | Status: DC
  Administered 2021-06-06: 20 [IU] via SUBCUTANEOUS
  Filled 2021-06-06: qty 0.2

## 2021-06-06 NOTE — Discharge Instructions (Signed)
Please call Dr. Windell Hummingbird office (cardiology) to set up a follow up appointment within a week to monitor your blood pressure. All medications for blood pressure have been held at this time and may need to be slowly restarted if your blood pressure increases ?

## 2021-06-06 NOTE — Plan of Care (Signed)
  Problem: Clinical Measurements: Goal: Will remain free from infection Outcome: Progressing Goal: Cardiovascular complication will be avoided Outcome: Progressing   Problem: Activity: Goal: Risk for activity intolerance will decrease Outcome: Progressing   Problem: Nutrition: Goal: Adequate nutrition will be maintained Outcome: Progressing   

## 2021-06-06 NOTE — TOC Initial Note (Signed)
Transition of Care (TOC) - Initial/Assessment Note  ? ? ?Patient Details  ?Name: Cory Campbell ?MRN: [id number] ?Date of Birth: 01/14/1981 ? ?Transition of Care (TOC) CM/SW Contact:    ?Marlowe Sax, RN ?Phone Number: ?06/06/2021, 10:05 AM ? ?Clinical Narrative:                 ? ?Transition of Care (TOC) Screening Note ? ? ?Patient Details  ?Name: Cory Campbell ?Date of Birth: [birth year] ? ? ?Transition of Care (TOC) CM/SW Contact:    ?Marlowe Sax, RN ?Phone Number: ?06/06/2021, 10:05 AM ? ? ? ?Transition of Care Department [facility]) has reviewed patient and no TOC needs have been identified at this time. We will continue to monitor patient advancement through interdisciplinary progression rounds. If new patient transition needs arise, please place a TOC consult. ?  ? ?  ?  ? ? ?Patient Goals and CMS Choice ?  ?  ?  ? ?Expected Discharge Plan and Services ?  ?  ?  ?  ?  ?                ?  ?  ?  ?  ?  ?  ?  ?  ?  ?  ? ?Prior Living Arrangements/Services ?  ?  ?  ?       ?  ?  ?  ?  ? ?Activities of Daily Living ?Home Assistive Devices/Equipment: CBG Meter, Hospital bed, Wheelchair ?ADL Screening (condition at time of admission) ?Patient's cognitive ability adequate to safely complete daily activities?: Yes ?Is the patient deaf or have difficulty hearing?: Yes ?Does the patient have difficulty seeing, even when wearing glasses/contacts?: Yes (Contacts) ?Does the patient have difficulty concentrating, remembering, or making decisions?: No ?Patient able to express need for assistance with ADLs?: Yes ?Does the patient have difficulty dressing or bathing?: No ?Independently performs ADLs?: No ?Communication: Independent ?Dressing (OT): Independent ?Grooming: Independent ?Feeding: Independent ?Bathing: Independent ?Toileting: Independent ?In/Out Bed: Independent ?Walks in Home: Dependent (Motorized wheelchair) ?Is this a change from baseline?: Pre-admission baseline ?Does the patient have difficulty walking or  climbing stairs?: Yes (Parapalegic) ?Weakness of Legs: Both ?Weakness of Arms/Hands: None ? ?Permission Sought/Granted ?  ?  ?   ?   ?   ?   ? ?Emotional Assessment ?  ?  ?  ?  ?  ?  ? ?Admission diagnosis:  Weakness [R53.1] ?AKI (acute kidney injury) (HCC) [N17.9] ?Patient Active Problem List  ? Diagnosis Date Noted  ? AKI (acute kidney injury) (HCC) 06/05/2021  ? Postural dizziness with presyncope 06/05/2021  ? Arthrogryposis, with contractures and functional paraplegia   ? Seizure disorder (HCC)   ? Headache   ? Acute asthma exacerbation 03/30/2020  ? Acute respiratory failure with hypoxia (HCC) 03/29/2020  ? Surgical wound, non healing   ? Acute colitis 05/20/2017  ? Colitis   ? Acute renal failure (HCC) 05/15/2017  ? Diarrhea 05/15/2017  ? Sepsis (HCC) 05/15/2017  ? Abdominal pain 11/25/2016  ? HIV (human immunodeficiency virus infection) (HCC) 11/19/2016  ? Hypotension 10/08/2016  ? Unstable angina (HCC) 10/06/2016  ? Shortness of breath 06/23/2015  ? Obesity 05/18/2014  ? Frequent falls 08/12/2013  ? Pain of left clavicle 08/12/2013  ? Tachycardia 01/21/2013  ? Chronic pain syndrome 08/12/2012  ? Sleep apnea 06/07/2012  ? Chest pain 06/20/2011  ? Hypertension associated with diabetes (HCC) 06/20/2011  ? 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/20/2011  ? Diabetes mellitus (  HCC) 06/20/2011  ? Asthma, chronic, unspecified asthma severity, with acute exacerbation 01/18/2008  ? Hyperlipidemia associated with type 2 diabetes mellitus (HCC) 08/28/2006  ? Essential hypertension 06/27/2005  ? ?PCP:  Emogene Morgan, MD ?Pharmacy:   ?[facility] - [place], [place] - [address] ?[address] ?[place] [address] ?Phone: [phone] Fax: [phone] ? ?[id number] - [place], [place] - [address] [address] ?[address] ?[place] [address] ?Phone: [phone] Fax: [phone] ? ? ? ? ?Social Determinants of Health (SDOH)  Interventions ?  ? ?Readmission Risk Interventions ?   ? View : No data to display.  ?  ?  ?  ? ? ? ?

## 2021-06-06 NOTE — Discharge Summary (Signed)
? ? ?Physician Discharge Summary  ?Patient: Cory ShipperCharles J Campbell ZOX:[id number] DOB: 03-Oct-1980   Code Status: Prior ?Admit date: 06/05/2021 ?Discharge date: 06/08/2021 ?Disposition: Home, No home health services recommended ?PCP: Cory Campbell, Cory Campbell ? ?Recommendations for Outpatient Follow-up:  ?Follow up with PCP within 1-2 weeks ?Recommend renal monitoring given significant episode of hypotension. Cr 2 on presentation and improved to 1.68 prior to dc ?Follow up with cardiology ?Monitor blood pressure. All antihypertensives were held at discharge as listed below ? ?Discharge Diagnoses:  ?Principal Problem: ?  Postural dizziness with presyncope ?Active Problems: ?  Hypotension ?  AKI (acute kidney injury) (HCC) ?  Headache ?  Insulin dependent type 2 diabetes mellitus (HCC) ?  Sleep apnea ?  Chronic pain syndrome ?  HIV (human immunodeficiency virus infection) (HCC) ?  Essential hypertension ?  Arthrogryposis, with contractures and functional paraplegia ?  Seizure disorder (HCC) ?  Weakness ? ?Brief Hospital Course Summary: ?Cory Campbell is a 41 y.o. male with a PMH significant for atrial fibrillation, HIV, IDT2DM, HTN, asthma, HLD, chronic pain syndrome, arthrogryposis with resultant osteoarthritis, contractures, and paraplegia, multiple abdominal surgeries (s/p ileostomy takedown May 2019), seizure disorder, BPH, depression, OSA on CPAP, Noted to be on medications for CHF and CAD but without these diagnoses on review of last cardiology note from 01/03/2021. ?  ?They presented from home to the ED on 06/05/2021 with dizziness, presyncope x several hours. This was associated with a headache. ?  ?In the ED, it was found that they had hypotension as low as 83/62 and transient hypoxia to as low as 86% but otherwise stable vitals.  ?Significant findings included normal CBC, creatinine of 2.0 above baseline of 0.8.  Troponin of 6.  Urinalysis was unremarkable.  EKG- sinus rhythm at 84 with nonspecific ST-T wave changes.    ?Chest x-ray- neg acute disease.   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 No definite gray matter heterotopia ?No evidence of acute or subacute infarct. No acute intracranial process. ?  ?They were initially treated with V fluid bolus, Reglan Benadryl and droperidol for headache.  ? ?Hypotension-all of patient's multiple home blood pressure medications were held throughout observation period Inpatient.  His blood pressure remained within normal limits while off of his therapies and he remained asymptomatic.  Patient was discharged with normal vital signs and discontinued all of his home blood pressure medications.  Recommended close follow-up with his cardiologist to reevaluate his blood pressure in the outpatient setting.  Patient, his wife both expressed understanding and agreement with that plan.  His cardiologist was informed of his condition as well. ? ?Discharge Condition: Good, improved ?Recommended discharge diet: Cardiac diet ? ?Consultations: ?None  ? ?Procedures/Studies: ?None  ? ? ?Allergies as of 06/06/2021   ? ?   Reactions  ? Penicillins Hives, Itching, Rash  ? Has patient had a PCN reaction causing immediate rash, facial/tongue/throat swelling, SOB or lightheadedness with hypotension: Yes ?Has patient had a PCN reaction causing severe rash involving mucus membranes or skin necrosis: No ?Has patient had a PCN reaction that required hospitalization No ?Has patient had a PCN reaction occurring within the last 10 years: No ?If all of the above answers are "NO", then may proceed with Cephalosporin use.  ? Erythromycin Base Itching, Rash  ? ?  ? ?  ?Medication List  ?  ? ?STOP taking these medications   ? ?amLODipine  10 MG tablet ?Commonly known as: NORVASC ?  ?baclofen 20 MG tablet ?Commonly known as: LIORESAL ?  ?buPROPion 150 MG 24 hr tablet ?Commonly known as: WELLBUTRIN  XL ?  ?chlorthalidone 25 MG tablet ?Commonly known as: HYGROTON ?  ?cloNIDine 0.3 mg/24hr patch ?Commonly known as: CATAPRES - Dosed in mg/24 hr ?  ?Entresto 49-51 MG ?Generic drug: sacubitril-valsartan ?  ?isosorbide mononitrate 60 MG 24 hr tablet ?Commonly known as: IMDUR ?  ?magnesium oxide 400 (241.3 Mg) MG tablet ?Commonly known as: MAG-OX ?  ?metoprolol tartrate 100 MG tablet ?Commonly known as: LOPRESSOR ?  ?NovoLOG FlexPen 100 UNIT/ML FlexPen ?Generic drug: insulin aspart ?  ?omeprazole 20 MG capsule ?Commonly known as: PRILOSEC ?  ?potassium chloride SA 20 MEQ tablet ?Commonly known as: KLOR-CON M ?  ?spironolactone 50 MG tablet ?Commonly known as: ALDACTONE ?  ? ?  ? ?TAKE these medications   ? ?acyclovir 400 MG tablet ?Commonly known as: ZOVIRAX ?Take 400 mg by mouth 2 (two) times daily. ?  ?AeroChamber Plus inhaler ?Use as instructed ?  ?albuterol (2.5 MG/3ML) 0.083% nebulizer solution ?Commonly known as: PROVENTIL ?Take 2.5 mg by nebulization every 4 (four) hours as needed for wheezing or shortness of breath. ?  ?albuterol 108 (90 Base) MCG/ACT inhaler ?Commonly known as: VENTOLIN HFA ?Inhale 2 puffs into the lungs every 4 (four) hours as needed for wheezing or shortness of breath. ?  ?alfuzosin 10 MG 24 hr tablet ?Commonly known as: UROXATRAL ?Take 10 mg by mouth daily. ?  ?aspirin EC 81 MG tablet ?Take 81 mg by mouth daily. Swallow whole. ?What changed: Another medication with the same name was removed. Continue taking this medication, and follow the directions you see here. ?  ?Biktarvy 50-200-25 MG Tabs tablet ?Generic drug: bictegravir-emtricitabine-tenofovir AF ?Take 1 tablet by mouth daily. ?  ?buprenorphine 7.5 MCG/HR ?Commonly known as: BUTRANS ?Place 7.5 mg onto the skin once a week. Saturday ?  ?cetirizine 10 MG tablet ?Commonly known as: ZYRTEC ?Take 10 mg by mouth daily. ?  ?citalopram 20 MG tablet ?Commonly known as: CELEXA ?Take 20 mg by mouth daily. ?  ?diclofenac sodium 1 %  Gel ?Commonly known as: VOLTAREN ?Apply 2-4 g topically 4 (four) times daily as needed. For pain. ?  ?dicyclomine 10 MG capsule ?Commonly known as: BENTYL ?Take 20 mg by mouth 2 (two) times daily before a meal. ?  ?empagliflozin 25 MG Tabs tablet ?Commonly known as: JARDIANCE ?Take 25 mg by mouth daily. ?  ?famotidine 20 MG tablet ?Commonly known as: PEPCID ?Take 20 mg by mouth daily. ?  ?feeding supplement (GLUCERNA SHAKE) Liqd ?Take 237 mLs by mouth 4 (four) times daily. ?  ?ferrous sulfate 325 (65 FE) MG tablet ?Take 325 mg by mouth 3 (three) times daily. ?  ?finasteride 5 MG tablet ?Commonly known as: PROSCAR ?Take 5 mg by mouth daily. ?  ?fluticasone 110 MCG/ACT inhaler ?Commonly known as: FLOVENT HFA ?Inhale 2 puffs into the lungs 2 (two) times daily. ?  ?fluticasone 50 MCG/ACT nasal spray ?Commonly known as: FLONASE ?Place 2 sprays into both nostrils daily. ?  ?gabapentin 300 MG capsule ?Commonly known as: NEURONTIN ?Take 300 mg by mouth at bedtime. ?  ?glucose 4 GM chewable tablet ?Chew 1 tablet by mouth as needed for low blood sugar. ?  ?insulin degludec 100 UNIT/ML FlexTouch Pen ?Commonly known as: TRESIBA ?Inject 100 Units into the skin 2 (two) times daily. ?  ?ketoconazole 2 % shampoo ?Commonly known as: NIZORAL ?Apply  1 application topically 2 (two) times a week. tues and thursday ?  ?melatonin 5 MG Tabs ?Take 10 mg by mouth at bedtime as needed (sleep). ?  ?metFORMIN 500 MG 24 hr tablet ?Commonly known as: GLUCOPHAGE-XR ?Take 1,000 mg by mouth 2 (two) times daily. ?  ?mirtazapine 45 MG tablet ?Commonly known as: REMERON ?Take 45 mg by mouth at bedtime. ?  ?multivitamin tablet ?Take 1 tablet by mouth daily. ?  ?nitroGLYCERIN 0.4 MG SL tablet ?Commonly known as: NITROSTAT ?Place 1 tablet (0.4 mg total) under the tongue every 5 (five) minutes as needed for chest pain. Call 911 if you have taken 3 Nitro for chest pain ?  ?nortriptyline 25 MG capsule ?Commonly known as: PAMELOR ?Take 50 mg by mouth at  bedtime. ?  ?omega-3 acid ethyl esters 1 g capsule ?Commonly known as: LOVAZA ?Take 1 g by mouth 3 (three) times daily. ?  ?ondansetron 4 MG disintegrating tablet ?Commonly known as: ZOFRAN-ODT ?Take 4 mg by mouth every

## 2021-06-25 ENCOUNTER — Other Ambulatory Visit: Payer: Self-pay

## 2021-06-25 ENCOUNTER — Emergency Department: Payer: Medicaid Other

## 2021-06-25 ENCOUNTER — Emergency Department
Admission: EM | Admit: 2021-06-25 | Discharge: 2021-06-25 | Disposition: A | Discharge status: Home / Self Care | Payer: Medicaid Other | Attending: Emergency Medicine | Admitting: Emergency Medicine

## 2021-06-25 DIAGNOSIS — J45909 Unspecified asthma, uncomplicated: Secondary | ICD-10-CM | POA: Insufficient documentation

## 2021-06-25 DIAGNOSIS — I10 Essential (primary) hypertension: Secondary | ICD-10-CM | POA: Insufficient documentation

## 2021-06-25 DIAGNOSIS — M9983 Other biomechanical lesions of lumbar region: Secondary | ICD-10-CM | POA: Diagnosis not present

## 2021-06-25 DIAGNOSIS — J449 Chronic obstructive pulmonary disease, unspecified: Secondary | ICD-10-CM | POA: Insufficient documentation

## 2021-06-25 DIAGNOSIS — Z21 Asymptomatic human immunodeficiency virus [HIV] infection status: Secondary | ICD-10-CM | POA: Insufficient documentation

## 2021-06-25 DIAGNOSIS — M79661 Pain in right lower leg: Secondary | ICD-10-CM | POA: Diagnosis present

## 2021-06-25 DIAGNOSIS — R000 Tachycardia, unspecified: Secondary | ICD-10-CM | POA: Diagnosis not present

## 2021-06-25 DIAGNOSIS — E119 Type 2 diabetes mellitus without complications: Secondary | ICD-10-CM | POA: Insufficient documentation

## 2021-06-25 DIAGNOSIS — G8929 Other chronic pain: Secondary | ICD-10-CM | POA: Diagnosis not present

## 2021-06-25 LAB — URINALYSIS, ROUTINE W REFLEX MICROSCOPIC
Bacteria, UA: NONE SEEN
Bilirubin Urine: NEGATIVE
Glucose, UA: >=500 mg/dL — AB
Hgb urine dipstick: NEGATIVE
Ketones, ur: NEGATIVE mg/dL
Leukocytes,Ua: NEGATIVE
Nitrite: NEGATIVE
Protein, ur: 30 mg/dL — AB
Specific Gravity, Urine: 1.03 (ref 1.005–1.030)
pH: 5 (ref 5.0–8.0)

## 2021-06-25 LAB — COMPREHENSIVE METABOLIC PANEL
ALT: 19 U/L (ref 0–44)
AST: 21 U/L (ref 15–41)
Albumin: 4.6 g/dL (ref 3.5–5.0)
Alkaline Phosphatase: 63 U/L (ref 38–126)
Anion gap: 10 (ref 5–15)
BUN: 17 mg/dL (ref 6–20)
CO2: 27 mmol/L (ref 22–32)
Calcium: 10.8 mg/dL — ABNORMAL HIGH (ref 8.9–10.3)
Chloride: 101 mmol/L (ref 98–111)
Creatinine, Ser: 1.17 mg/dL (ref 0.61–1.24)
GFR, Estimated: >60 mL/min (ref >60)
Glucose, Bld: 128 mg/dL — ABNORMAL HIGH (ref 70–99)
Potassium: 4.9 mmol/L (ref 3.5–5.1)
Sodium: 138 mmol/L (ref 135–145)
Total Bilirubin: 0.8 mg/dL (ref 0.3–1.2)
Total Protein: 8 g/dL (ref 6.5–8.1)

## 2021-06-25 LAB — CBC WITH DIFFERENTIAL/PLATELET
Abs Immature Granulocytes: 0.07 10*3/uL (ref 0.00–0.07)
Basophils Absolute: 0 10*3/uL (ref 0.0–0.1)
Basophils Relative: 0 %
Eosinophils Absolute: 0 10*3/uL (ref 0.0–0.5)
Eosinophils Relative: 0 %
HCT: 53 % — ABNORMAL HIGH (ref 39.0–52.0)
Hemoglobin: 16.9 g/dL (ref 13.0–17.0)
Immature Granulocytes: 1 %
Lymphocytes Relative: 16 %
Lymphs Abs: 2 10*3/uL (ref 0.7–4.0)
MCH: 28.2 pg (ref 26.0–34.0)
MCHC: 31.9 g/dL (ref 30.0–36.0)
MCV: 88.3 fL (ref 80.0–100.0)
Monocytes Absolute: 0.7 10*3/uL (ref 0.1–1.0)
Monocytes Relative: 6 %
Neutro Abs: 9.6 10*3/uL — ABNORMAL HIGH (ref 1.7–7.7)
Neutrophils Relative %: 77 %
Platelets: 358 10*3/uL (ref 150–400)
RBC: 6 MIL/uL — ABNORMAL HIGH (ref 4.22–5.81)
RDW: 15.3 % (ref 11.5–15.5)
WBC: 12.5 10*3/uL — ABNORMAL HIGH (ref 4.0–10.5)
nRBC: 0 % (ref 0.0–0.2)

## 2021-06-25 NOTE — ED Notes (Signed)
[facility] called for transport back to home

## 2021-06-25 NOTE — ED Triage Notes (Signed)
Pt states he is having bilateral leg pain from his knee down- pt states this has been going on since Thursday- pt describes it as a sharp pain "pins and needles" feeling- pt states he tried tylenol but it did not help

## 2021-06-25 NOTE — ED Notes (Signed)
Ultrasound with pt 

## 2021-06-25 NOTE — ED Notes (Signed)
Pt given urinal and instructed on use for sample 

## 2021-06-25 NOTE — ED Notes (Signed)
Pt states his wife does not drive and his alternate ride is not answering his calls, requests ambulance transportation. Options discussed with pt, pt unable to walk, no viable transportation options available. Will arrange for [facility] discharge.

## 2021-06-25 NOTE — ED Triage Notes (Signed)
First Nurse Note:  Pt via EMS from home. Pt c/o bilateral lower leg swelling and pain, pain is worse when he tries to bear weight. States it feel like its "pins and needles". Pt gets cortisone shots q2mon last dose was yesterday.   153/54 99% and RA  104 HR

## 2021-06-25 NOTE — ED Provider Notes (Signed)
[facility] Provider Note  Patient Contact: 7:37 PM (approximate)   History   Leg Pain   HPI  Cory Campbell is a 41 y.o. male with history of arythrogryposis multiplex congenita, history of multiple abdominal surgeries, type 2 diabetes, hypertension, COPD, asthma, obstructive sleep apnea, HIV presents to the emergency department with chronic pain of the lower extremities and new tingling and burning bilaterally.  Patient states that he has pain from below his knees down to his feet.  He denies changes in range of motion of the bilateral ankles or the toes.  He denies bowel or bladder incontinence or saddle anesthesia.  He states that he normally walks with the assistance of crutches.     Physical Exam   Triage Vital Signs: ED Triage Vitals  Enc Vitals Group     BP 06/25/21 1824 (!) 147/92     Pulse Rate 06/25/21 1824 (!) 103     Resp 06/25/21 1824 18     Temp 06/25/21 1824 98 F (36.7 C)     Temp Source 06/25/21 1824 Oral     SpO2 06/25/21 1824 100 %     Weight 06/25/21 1825 174 lb (78.9 kg)     Height 06/25/21 1825 5\' 6"  (1.676 m)     Head Circumference --      Peak Flow --      Pain Score 06/25/21 1825 10     Pain Loc --      Pain Edu? --      Excl. in GC? --     Most recent vital signs: Vitals:   06/25/21 1824  BP: (!) 147/92  Pulse: (!) 103  Resp: 18  Temp: 98 F (36.7 C)  SpO2: 100%     General: Alert and in no acute distress. Eyes:  PERRL. EOMI. Head: No acute traumatic findings ENT:      Nose: No congestion/rhinnorhea.      Mouth/Throat: Mucous membranes are moist.  Neck: No stridor. No cervical spine tenderness to palpation. Cardiovascular:  Good peripheral perfusion Respiratory: Normal respiratory effort without tachypnea or retractions. Lungs CTAB. Good air entry to the bases with no decreased or absent breath sounds. Gastrointestinal: Bowel sounds 4 quadrants. Soft and nontender to palpation. No guarding or rigidity.  No palpable masses. No distention. No CVA tenderness. Musculoskeletal: Full range of motion to all extremities.  Neurologic:  No gross focal neurologic deficits are appreciated.  Skin:   No rash noted    ED Results / Procedures / Treatments   Labs (all labs ordered are listed, but only abnormal results are displayed) Labs Reviewed  CBC WITH DIFFERENTIAL/PLATELET - Abnormal; Notable for the following components:      Result Value   WBC 12.5 (*)    RBC 6.00 (*)    HCT 53.0 (*)    Neutro Abs 9.6 (*)    All other components within normal limits  COMPREHENSIVE METABOLIC PANEL - Abnormal; Notable for the following components:   Glucose, Bld 128 (*)    Calcium 10.8 (*)    All other components within normal limits  URINALYSIS, ROUTINE W REFLEX MICROSCOPIC - Abnormal; Notable for the following components:   Color, Urine YELLOW (*)    APPearance CLEAR (*)    Glucose, UA >=500 (*)    Protein, ur 30 (*)    All other components within normal limits      RADIOLOGY  I personally viewed and evaluated these images as part of my  medical decision making, as well as reviewing the written report by the radiologist.  ED Provider Interpretation: I personally interpreted venous ultrasound.  No evidence of DVT.  I personally interpreted CT of the lumbar spine.  Patient has mild bilateral foraminal stenosis at L4-L5 and L5-S1.   PROCEDURES:  Critical Care performed: No  Procedures   MEDICATIONS ORDERED IN ED: Medications - No data to display   IMPRESSION / MDM / ASSESSMENT AND PLAN / ED COURSE  I reviewed the triage vital signs and the nursing notes.                              Assessment and plan: Chronic leg pain 41 year old male presents to the emergency department with chronic leg pain and concern for some tingling that occurs from the knee down.  Patient was mildly tachycardic at triage but vital signs were otherwise reassuring.  CBC, CMP and urinalysis unremarkable.  CT of  the lumbar spine indicates mild foraminal stenosis at L4-L5 and L5-S1 but no other acute abnormality.  No evidence of DVT within the bilateral lower extremities.  Patient takes Suboxone chronically for pain.  He was advised to follow-up with his primary care provider regarding chronic pain.  All patient questions were answered. Clinical Course as of 06/25/21 2227  Sat Jun 25, 2021  2125 Sodium: 138 [JW]    Clinical Course User Index [JW] Orvil Feil, PA-C     FINAL CLINICAL IMPRESSION(S) / ED DIAGNOSES   Final diagnoses:  Chronic pain of both lower extremities     Rx / DC Orders   ED Discharge Orders     None        Note:  This document was prepared using Dragon voice recognition software and may include unintentional dictation errors.   [name], PA-C 06/25/21 2227    [name], MD 06/28/21 [phone]

## 2021-07-04 ENCOUNTER — Ambulatory Visit
Admission: EM | Admit: 2021-07-04 | Discharge: 2021-07-04 | Disposition: A | Discharge status: Home / Self Care | Payer: Medicaid Other | Attending: Student | Admitting: Student

## 2021-07-04 DIAGNOSIS — J0100 Acute maxillary sinusitis, unspecified: Secondary | ICD-10-CM | POA: Diagnosis not present

## 2021-07-04 DIAGNOSIS — Z880 Allergy status to penicillin: Secondary | ICD-10-CM

## 2021-07-04 MED ORDER — DOXYCYCLINE HYCLATE 100 MG PO CAPS: 100.0000 mg | ORAL_CAPSULE | Freq: Two times a day (BID) | ORAL | 0 refills | Status: AC

## 2021-07-04 NOTE — ED Provider Notes (Signed)
[facility]    CSN: [id number] Arrival date & time: 07/04/21  1326      History   Chief Complaint No chief complaint on file.   HPI Cory Campbell is a 41 y.o. male presenting with 3 days of sinus symptoms, as well as swelling above the upper lip.  History extensive as below, including hypertension, cardiomyopathy, diabetes.  Describes swelling above the lip, states he is concerned that he is developing a cold sore.  Also with progressively worsening facial pressure, right worse than left with radiation to the right ear.  Denies hearing changes, dizziness, tinnitus.  Denies recent illness, cough, fever/chills.  HPI  Past Medical History:  Diagnosis Date   (HFpEF) heart failure with preserved ejection fraction (HCC)    Abrasion or friction burn of foot and toe(s), without mention of infection    Acid reflux    Adopted    Allergic rhinitis, cause unspecified    Anxiety    Arthritis    Arthrogryposis    Asthma    Bladder wall thickening    BPH (benign prostatic hyperplasia)    Chronic pain syndrome    Depressive disorder, not elsewhere classified    Diabetes mellitus without complication ([place])    Diverticulitis of colon (without mention of hemorrhage)(562.11)    ED (erectile dysfunction)    Herpes genitalis    History of echocardiogram    a. 10/2016: EF 50-55%, normal wall motion   History of stress test    a. 06/2011: significant GI uptake artifact, no evidence of ischemia, EF 56%   HIV infection (HCC)    HTN (hypertension)    Hyperlipidemia    Hypertensive cardiomyopathy ([place])    Hypogonadism in male    Myalgia and myositis, unspecified    OAB (overactive bladder)    Obesity    Other psoriasis    Premature baby    born premature   Seizure disorder ([place])    Sleep apnea    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 or other cause, without mention of thyrotoxic crisis or storm    hyperthyroidism   TIA (transient ischemic attack)    Type II or  unspecified type diabetes mellitus with neurological manifestations, not stated as uncontrolled(250.60)     Patient Active Problem List   Diagnosis Date Noted   Weakness    AKI (acute kidney injury) ([place]) 06/05/2021   Postural dizziness with presyncope 06/05/2021   Arthrogryposis, with contractures and functional paraplegia    Seizure disorder ([place])    Headache    Acute asthma exacerbation 03/30/2020   Acute respiratory failure with hypoxia ([place]) 03/29/2020   Surgical wound, non healing    Acute colitis 05/20/2017   Colitis    Acute renal failure ([place]) 05/15/2017   Diarrhea 05/15/2017   Sepsis ([place]) 05/15/2017   Abdominal pain 11/25/2016   HIV (human immunodeficiency virus infection) ([place]) 11/19/2016   Hypotension 10/08/2016   Unstable angina ([place]) 10/06/2016   Shortness of breath 06/23/2015   Obesity 05/18/2014   Frequent falls 08/12/2013   Pain of left clavicle 08/12/2013   Tachycardia 01/21/2013   Chronic pain syndrome 08/12/2012   Sleep apnea 06/07/2012   Chest pain 06/20/2011   Hypertension associated with diabetes ([place]) 06/20/2011   Insulin dependent type 2 diabetes mellitus ([place]) 06/20/2011   Diabetes mellitus ([place]) 06/20/2011   Asthma, chronic, unspecified asthma severity, with acute exacerbation 01/18/2008   Hyperlipidemia associated with type 2 diabetes mellitus ([place]) 08/28/2006   Essential hypertension 06/27/2005  Past Surgical History:  Procedure Laterality Date   APPENDECTOMY     COLOSTOMY     CORNEAL TRANSPLANT     feet surgery     both   HAND SURGERY     left and right   leg surgery     left and right   ORTHOPEDIC SURGERY     hands, feet, knees, legs   tubes in ears     both       Home Medications    Prior to Admission medications   Medication Sig Start Date End Date Taking? Authorizing Provider  acyclovir (ZOVIRAX) 400 MG tablet Take 400 mg by mouth 2 (two) times daily.   Yes [provider]  albuterol (PROVENTIL HFA;VENTOLIN  HFA) 108 (90 BASE) MCG/ACT inhaler Inhale 2 puffs into the lungs every 4 (four) hours as needed for wheezing or shortness of breath.    Yes [provider]  albuterol (PROVENTIL) (2.5 MG/3ML) 0.083% nebulizer solution Take 2.5 mg by nebulization every 4 (four) hours as needed for wheezing or shortness of breath.   Yes [provider]  aspirin EC 81 MG tablet Take 81 mg by mouth daily. Swallow whole.   Yes [provider]  BIKTARVY 50-200-25 MG TABS tablet Take 1 tablet by mouth daily. 11/21/16  Yes [provider]  buprenorphine (BUTRANS) 7.5 MCG/HR Place 7.5 mg onto the skin once a week. Saturday   Yes [provider]  cetirizine (ZYRTEC) 10 MG tablet Take 10 mg by mouth daily.   Yes [provider]  citalopram (CELEXA) 20 MG tablet Take 20 mg by mouth daily.   Yes [provider]  diclofenac sodium (VOLTAREN) 1 % GEL Apply 2-4 g topically 4 (four) times daily as needed. For pain.   Yes [provider]  dicyclomine (BENTYL) 10 MG capsule Take 20 mg by mouth 2 (two) times daily before a meal. 05/01/21  Yes [provider]  doxycycline (VIBRAMYCIN) 100 MG capsule Take 1 capsule (100 mg total) by mouth 2 (two) times daily for 7 days. 07/04/21 07/11/21 Yes Hazel Sams, PA-C  empagliflozin (JARDIANCE) 25 MG TABS tablet Take 25 mg by mouth daily. 01/19/20  Yes [provider]  famotidine (PEPCID) 20 MG tablet Take 20 mg by mouth daily.  01/03/16  Yes [provider]  feeding supplement, GLUCERNA SHAKE, (GLUCERNA SHAKE) LIQD Take 237 mLs by mouth 4 (four) times daily.   Yes [provider]  finasteride (PROSCAR) 5 MG tablet Take 5 mg by mouth daily.   Yes [provider]  fluticasone (FLONASE) 50 MCG/ACT nasal spray Place 2 sprays into both nostrils daily.   Yes [provider]  fluticasone (FLOVENT HFA) 110 MCG/ACT inhaler Inhale 2 puffs into the lungs 2 (two) times daily.   Yes  [provider]  glucose 4 GM chewable tablet Chew 1 tablet by mouth as needed for low blood sugar.   Yes [provider]  insulin degludec (TRESIBA) 100 UNIT/ML FlexTouch Pen Inject 100 Units into the skin 2 (two) times daily.   Yes [provider]  ketoconazole (NIZORAL) 2 % shampoo Apply 1 application topically 2 (two) times a week. tues and thursday 08/27/14  Yes [provider]  Melatonin 5 MG TABS Take 10 mg by mouth at bedtime as needed (sleep).   Yes [provider]  metFORMIN (GLUCOPHAGE-XR) 500 MG 24 hr tablet Take 1,000 mg by mouth 2 (two) times daily.   Yes [provider]  mirtazapine (REMERON) 45 MG tablet Take 45 mg by mouth at bedtime.   Yes [provider]  Multiple Vitamin (MULTIVITAMIN) tablet Take 1 tablet by mouth daily.   Yes [provider]  nitroGLYCERIN (NITROSTAT) 0.4 MG SL tablet Place 1 tablet (0.4 mg total) under the tongue every 5 (five) minutes as needed for chest pain. Call 911 if you have taken 3 Nitro for chest pain 03/14/21  Yes Gollan, Kathlene November, MD  nortriptyline (PAMELOR) 25 MG capsule Take 50 mg by mouth at bedtime.   Yes [provider]  omega-3 acid ethyl esters (LOVAZA) 1 g capsule Take 1 g by mouth 3 (three) times daily.   Yes [provider]  ondansetron (ZOFRAN-ODT) 4 MG disintegrating tablet Take 4 mg by mouth every 8 (eight) hours as needed for nausea or vomiting.   Yes [provider]  polyethylene glycol (MIRALAX / GLYCOLAX) 17 g packet Take 17 g by mouth 3 (three) times daily.   Yes [provider]  prednisoLONE acetate (PRED FORTE) 1 % ophthalmic suspension Place 1 drop into the right eye daily.    Yes [provider]  rosuvastatin (CRESTOR) 40 MG tablet TAKE 1 TABLET(40 MG) BY MOUTH DAILY 04/30/19  Yes Gollan, Kathlene November, MD  solifenacin (VESICARE) 10 MG tablet Take 10 mg by mouth daily.   Yes [provider]  Spacer/Aero-Holding  Chambers (AEROCHAMBER PLUS) inhaler Use as instructed 03/23/18  Yes Melynda Ripple, MD  tiZANidine (ZANAFLEX) 4 MG capsule Take 1 capsule (4 mg total) by mouth 3 (three) times daily as needed for muscle spasms. 04/14/19  Yes Triplett, Cari B, FNP  TRULICITY 1.5 0000000 SOPN Inject 1.5 mg into the skin once a week. 01/14/20  Yes [provider]  alfuzosin (UROXATRAL) 10 MG 24 hr tablet Take 10 mg by mouth daily.    [provider]  gabapentin (NEURONTIN) 300 MG capsule Take 300 mg by mouth at bedtime.    [provider]    Family History Family History  Adopted: Yes  Family history unknown: Yes    Social History Social History   Tobacco Use   Smoking status: Former    Types: Pipe    Quit date: 01/2018    Years since quitting: 3.4   Smokeless tobacco: Never  Vaping Use   Vaping Use: Some days   Substances: Nicotine, Flavoring  Substance Use Topics   Alcohol use: Not Currently    Alcohol/week: 0.0 standard drinks    Comment: used to be moderate   Drug use: No     Allergies   Penicillins and Erythromycin base   Review of Systems Review of Systems  Constitutional:  Negative for appetite change, chills and fever.  HENT:  Positive for congestion and sinus pressure. Negative for ear pain, rhinorrhea, sinus pain and sore throat.   Eyes:  Negative for redness and visual disturbance.  Respiratory:  Negative for cough, chest tightness, shortness of breath and wheezing.   Cardiovascular:  Negative for chest pain and palpitations.  Gastrointestinal:  Negative for abdominal pain, constipation, diarrhea, nausea and vomiting.  Genitourinary:  Negative for dysuria, frequency and urgency.  Musculoskeletal:  Negative for myalgias.  Neurological:  Negative for dizziness, weakness and headaches.  Psychiatric/Behavioral:  Negative for confusion.   All other systems reviewed and are negative.   Physical Exam Triage Vital Signs ED Triage Vitals  Enc Vitals  Group     BP 07/04/21 1345 121/86     Pulse Rate 07/04/21  1345 91     Resp 07/04/21 1345 20     Temp 07/04/21 1345 98.8 F (37.1 C)     Temp Source 07/04/21 1345 Oral     SpO2 07/04/21 1345 97 %     Weight 07/04/21 1342 174 lb (78.9 kg)     Height 07/04/21 1342 5\' 6"  (1.676 m)     Head Circumference --      Peak Flow --      Pain Score 07/04/21 1342 8     Pain Loc --      Pain Edu? --      Excl. in [place]? --    No data found.  Updated Vital Signs BP 121/86 (BP Location: Right Arm)   Pulse 91   Temp 98.8 F (37.1 C) (Oral)   Resp 20   Ht 5\' 6"  (1.676 m)   Wt 174 lb (78.9 kg)   SpO2 97%   BMI 28.08 kg/m   Visual Acuity Right Eye Distance:   Left Eye Distance:   Bilateral Distance:    Right Eye Near:   Left Eye Near:    Bilateral Near:     Physical Exam Vitals reviewed.  Constitutional:      Appearance: Normal appearance. He is not ill-appearing.  HENT:     Head: Normocephalic and atraumatic.     Right Ear: Hearing, tympanic membrane, ear canal and external ear normal. No swelling or tenderness. No middle ear effusion. There is no impacted cerumen. No mastoid tenderness. Tympanic membrane is not injected, scarred, perforated, erythematous, retracted or bulging.     Left Ear: Hearing, tympanic membrane, ear canal and external ear normal. No swelling or tenderness.  No middle ear effusion. There is no impacted cerumen. No mastoid tenderness. Tympanic membrane is not injected, scarred, perforated, erythematous, retracted or bulging.     Nose:     Right Sinus: Maxillary sinus tenderness present. No frontal sinus tenderness.     Left Sinus: Maxillary sinus tenderness present. No frontal sinus tenderness.     Mouth/Throat:     Pharynx: Oropharynx is clear. No oropharyngeal exudate or posterior oropharyngeal erythema.  Cardiovascular:     Rate and Rhythm: Normal rate and regular rhythm.     Heart sounds: Normal heart sounds.  Pulmonary:     Effort: Pulmonary effort is  normal.     Breath sounds: Normal breath sounds.  Lymphadenopathy:     Cervical: No cervical adenopathy.  Skin:    Comments: 64mm above upper lip: small  well circumscribed pustule. Does not extend to nares. No surrounding skin changes.  Neurological:     General: No focal deficit present.     Mental Status: He is alert and oriented to person, place, and time.  Psychiatric:        Mood and Affect: Mood normal.        Behavior: Behavior normal.        Thought Content: Thought content normal.        Judgment: Judgment normal.     UC Treatments / Results  Labs (all labs ordered are listed, but only abnormal results are displayed) Labs Reviewed - No data to display  EKG   Radiology No results found.  Procedures Procedures (including critical care time)  Medications Ordered in UC Medications - No data to display  Initial Impression / Assessment and Plan / UC Course  I have reviewed the triage vital signs and the nursing notes.  Pertinent labs & imaging results  that were available during my care of the patient were reviewed by me and considered in my medical decision making (see chart for details).     This patient is a very pleasant 41 y.o. year old male presenting with sinusitis and facial pustule. Afebrile, nontachy. Penicillin allergic. Will manage with doxycycline as below. ED return precautions discussed. Patient verbalizes understanding and agreement.  .   Final Clinical Impressions(s) / UC Diagnoses   Final diagnoses:  Acute non-recurrent maxillary sinusitis  Penicillin allergy     Discharge Instructions      -Doxycycline twice daily for 7 days.  Make sure to wear sunscreen while spending time outside while on this medication as it can increase your chance of sunburn. You can take this medication with food if you have a sensitive stomach.    ED Prescriptions     Medication Sig Dispense Auth. Provider   doxycycline (VIBRAMYCIN) 100 MG capsule Take 1  capsule (100 mg total) by mouth 2 (two) times daily for 7 days. 14 capsule Hazel Sams, PA-C      PDMP not reviewed this encounter.   Hazel Sams, PA-C 07/04/21 1427

## 2021-07-04 NOTE — Discharge Instructions (Addendum)
-  Doxycycline twice daily for 7 days.  Make sure to wear sunscreen while spending time outside while on this medication as it can increase your chance of sunburn. You can take this medication with food if you have a sensitive stomach.  

## 2021-07-04 NOTE — ED Triage Notes (Signed)
Patient thinks he has a sinus infection -- three days of sx. Patient woke up this AM and his lip was swollen -- thinks its a cold sore.

## 2021-07-06 ENCOUNTER — Encounter: Payer: Self-pay | Admitting: *Deleted

## 2021-07-06 ENCOUNTER — Emergency Department
Admission: EM | Admit: 2021-07-06 | Discharge: 2021-07-06 | Disposition: A | Discharge status: Home / Self Care | Payer: Medicaid Other | Attending: Emergency Medicine | Admitting: Emergency Medicine

## 2021-07-06 ENCOUNTER — Other Ambulatory Visit: Payer: Self-pay

## 2021-07-06 DIAGNOSIS — M5432 Sciatica, left side: Secondary | ICD-10-CM | POA: Insufficient documentation

## 2021-07-06 DIAGNOSIS — I10 Essential (primary) hypertension: Secondary | ICD-10-CM | POA: Diagnosis not present

## 2021-07-06 DIAGNOSIS — J45909 Unspecified asthma, uncomplicated: Secondary | ICD-10-CM | POA: Insufficient documentation

## 2021-07-06 DIAGNOSIS — M5431 Sciatica, right side: Secondary | ICD-10-CM | POA: Diagnosis not present

## 2021-07-06 DIAGNOSIS — E119 Type 2 diabetes mellitus without complications: Secondary | ICD-10-CM | POA: Insufficient documentation

## 2021-07-06 DIAGNOSIS — M79604 Pain in right leg: Secondary | ICD-10-CM | POA: Diagnosis present

## 2021-07-06 DIAGNOSIS — Z21 Asymptomatic human immunodeficiency virus [HIV] infection status: Secondary | ICD-10-CM | POA: Insufficient documentation

## 2021-07-06 MED ORDER — KETOROLAC TROMETHAMINE 30 MG/ML IJ SOLN
30.0000 mg | Freq: Once | INTRAMUSCULAR | Status: AC
  Administered 2021-07-06: 30 mg via INTRAMUSCULAR
  Filled 2021-07-06: qty 1

## 2021-07-06 MED ORDER — NAPROXEN 500 MG PO TABS: 500.0000 mg | ORAL_TABLET | Freq: Two times a day (BID) | ORAL | 11 refills | Status: DC

## 2021-07-06 MED ORDER — TIZANIDINE HCL 4 MG PO TABS: 4.0000 mg | ORAL_TABLET | Freq: Three times a day (TID) | ORAL | 11 refills | Status: AC

## 2021-07-06 MED ORDER — NAPROXEN 500 MG PO TABS: 500.0000 mg | ORAL_TABLET | Freq: Two times a day (BID) | ORAL | 0 refills | Status: AC

## 2021-07-06 NOTE — ED Triage Notes (Signed)
Pt from home via ems with reports of Bilateral foot pain x 2 weeks. No known injury. Per report pt had MRI completed a few days ago and it was normal.

## 2021-07-06 NOTE — ED Notes (Signed)
Pt gives verbal consent to dc  

## 2021-07-06 NOTE — ED Triage Notes (Addendum)
Pt brought in via ems from home.  Pt has pain in both feet radiating into both legs.   No known injury.  Pt taking goody powders without relief.  Pt alert  hx chronic pain .

## 2021-07-06 NOTE — ED Notes (Signed)
Called [facility] for transport back to home

## 2021-07-06 NOTE — ED Provider Notes (Signed)
[facility] Provider Note    Event Date/Time   First MD Initiated Contact with Patient 07/06/21 1923     (approximate)   History   Chief Complaint Leg Pain   HPI Cory Campbell is a 41 y.o. male, history of hypertension, diabetes, HIV, seizure disorder, or arthrogryposis (with contractures and functional paraplegia), asthma, presents to the emergency department for evaluation of bilateral foot pain.  Patient states this been going on for the past 2 weeks.  Endorses pain that extends from his hips down to his legs and bottom of his feet bilaterally.  He states that he was here in the emergency department recently for similar symptoms, where he underwent a CT scan, which reportedly came back negative.  He was not prescribed any new medications.  He states that his pain has failed to improve.  He states that he has been taking Goody powders with mild relief.  Denies any recent falls or injuries.  Denies fever/chills, saddle anesthesia, bowel/bladder dysfunction, chest pain, abdominal pain, shortness of breath, nausea/vomiting, diarrhea, cold sensation in lower extremities, or headache.  History Limitations: No limitations.        Physical Exam  Triage Vital Signs: ED Triage Vitals  Enc Vitals Group     BP 07/06/21 1907 (!) 140/101     Pulse Rate 07/06/21 1907 93     Resp 07/06/21 1907 20     Temp 07/06/21 1907 97.6 F (36.4 C)     Temp Source 07/06/21 1907 Oral     SpO2 07/06/21 1907 97 %     Weight 07/06/21 1907 174 lb (78.9 kg)     Height 07/06/21 1907 5\' 6"  (1.676 m)     Head Circumference --      Peak Flow --      Pain Score 07/06/21 1913 10     Pain Loc --      Pain Edu? --      Excl. in [place]? --     Most recent vital signs: Vitals:   07/06/21 1907  BP: (!) 140/101  Pulse: 93  Resp: 20  Temp: 97.6 F (36.4 C)  SpO2: 97%    General: Awake, NAD.  Skin: Warm, dry. No rashes or lesions.  Eyes: PERRL. Conjunctivae normal.  CV: Good  peripheral perfusion.  Resp: Normal effort.  Abd: Soft, non-tender. No distention.  Neuro: At baseline. No gross neurological deficits.   Focused Exam: Pain elicited when palpating the bottom of the patient's feet and with straight leg test bilaterally.  No bony tenderness appreciated anywhere along the lower extremities bilaterally.  No bony tenderness along the lumbar spine.  Pulse, motor, sensation intact distally in both lower extremities  Physical Exam    ED Results / Procedures / Treatments  Labs (all labs ordered are listed, but only abnormal results are displayed) Labs Reviewed - No data to display   EKG N/A.   RADIOLOGY  ED Provider Interpretation: N/A.  No results found.  PROCEDURES:  Critical Care performed: N/A  Procedures    MEDICATIONS ORDERED IN ED: Medications  ketorolac (TORADOL) 30 MG/ML injection 30 mg (30 mg Intramuscular Given 07/06/21 2022)     IMPRESSION / MDM / ASSESSMENT AND PLAN / ED COURSE  I reviewed the triage vital signs and the nursing notes.                              Differential diagnosis  includes, but is not limited to, lumbar radiculopathy, plantar fasciitis, spinal stenosis, lumbosacral strain.  ED Course Patient appears well, NAD.  Currently endorsing mild-moderate pain.  We will go ahead treat with IM ketorolac    Assessment/Plan Patient presents with persistent pain in his lower extremities that radiate down to the plantar aspect of his feet.  Suspicious for lumbar radiculopathy/sciatica his CT scan on 06/25/2021 shows mild bilateral foraminal stenosis at level 4/5 and mild right foraminal stenosis at L5/S1.  I suspect this may be contributing to his symptoms.  He denies any recent falls or injuries, or acute worsening of pain.  I do not believe any additional imaging is necessary at this time.  Treated here with IM ketorolac.  He is currently already on gabapentin, lidocaine patches, and Suboxone.  We will provide him  with a short-term prescription for tizanidine and naproxen.  Advised him that he would need to follow-up with his orthopedist and pain management doctor for further treatment.  We will plan to discharge.  Patient's presentation is most consistent with acute, uncomplicated illness.   Provided the patient with anticipatory guidance, return precautions, and educational material. Encouraged the patient to return to the emergency department at any time if they begin to experience any new or worsening symptoms. Patient expressed understanding and agreed with the plan.       FINAL CLINICAL IMPRESSION(S) / ED DIAGNOSES   Final diagnoses:  Bilateral sciatica     Rx / DC Orders   ED Discharge Orders          Ordered    naproxen (NAPROSYN) 500 MG tablet  2 times daily with meals,   Status:  Discontinued        07/06/21 2037    tiZANidine (ZANAFLEX) 4 MG tablet  3 times daily        07/06/21 2037    naproxen (NAPROSYN) 500 MG tablet  2 times daily with meals        07/06/21 2037             Note:  This document was prepared using Dragon voice recognition software and may include unintentional dictation errors.   Varney Daily, [place] 07/06/21 2039    [name], MD 07/13/21 [phone]

## 2021-07-06 NOTE — Discharge Instructions (Addendum)
-  You may take naproxen and tizanidine as needed for pain.  Use caution, as tizanidine may make you drowsy.  You may additionally continue to take your other pain medications as prescribed.  -Please follow-up with your orthopedist and pain management doctor for further treatment.  -Return to the emergency department anytime if you begin to experience any new or worsening symptoms

## 2021-07-10 NOTE — Progress Notes (Unsigned)
Date:  07/11/2021   ID:  Cory Campbell, DOB [birth year], MRN [id number]  Patient Location:  [address] [place] [address]   Provider location:   [facility], [place] office  PCP:  Emogene Morgan, MD  Cardiologist:  Fonnie Mu  Chief Complaint  Patient presents with   6 month follow up     "Doing well." Medications reviewed by the patient verbally.     History of Present Illness:    Cory Campbell is a 41 y.o. male has a past medical history of Arthrogryposis multiplex congenita,  chronic pain in his legs,  asthma,  hypertension,  diabetes,  severe obstructive sleep apnea on CPAP since April 2012,  obesity,  seizure disorder,  psoriasis with several  evaluations in the emergency room for chest pain on April 23, 06/01/2011.  frequent falls, walks with leg braces and canes atypical chest pain, often exacerbated by falls Ostomy He presents for routine followup of his hypertension and chest pain, controlled diabetes  Last seen by myself in November 2022 Numerous trips to the emergency room, recently leg pain Started on muscle relaxer  Another er trip Postural hypotension presented from home to the ED on 06/05/2021 with dizziness, presyncope x several hours. This was associated with a headache. In the ED, it was found that they had hypotension as low as 83/62 all of patient's multiple home blood pressure medications were held on discharge  In follow-up today he reports that PMD restarted medications Medications confirmed on recent nephrology visit Jul 06, 2021, vitals not documented, no medication changes made  Per nephrology at [facility] Taking Lisinopril 40 mg qd, Norvasc 10 mg qd, Metoprolol 150 mg bid, Aldactone  qd, chlorthalidone 25 mg qd, Imdur 30 mg qd, and Clonidine 0.3 mcg patch.   Taking all pills in the AM Tries to stay hydrated Denies any orthostasis symptoms Low blood pressure on today's visit, 100  systolic, confirmed on recheck  Reports he is doing well, wife doing well Baby boy is now 2 in July 2023, his name is jerone  Denies any recent falls No chest pain or shortness of breath  Followed by nephrology, notes reviewed  EKG personally reviewed by myself on todays visit Sinus tachycardia rate 86 bpm no significant ST-T wave changes  Of past medical history reviewed  emergency room May 05, 2018 Urinary tract infection Bilateral low back pain Treated with Pyridium, antibiotics, Diflucan  Fall April 13, 2018 shoulder injury, seen in the ER shoulder pain Shoulder is doing better  Seen in the ER January 17, 2018 diarrhea abdominal pain Symptoms better  ostomy repair later in March 2019 at [facility]   Past Medical History:  Diagnosis Date   (HFpEF) heart failure with preserved ejection fraction (HCC)    Abrasion or friction burn of foot and toe(s), without mention of infection    Acid reflux    Adopted    Allergic rhinitis, cause unspecified    Anxiety    Arthritis    Arthrogryposis    Asthma    Bladder wall thickening    BPH (benign prostatic hyperplasia)    Chronic pain syndrome    Depressive disorder, not elsewhere classified    Diabetes mellitus without complication (HCC)    Diverticulitis of colon (without mention of hemorrhage)(562.11)    ED (erectile dysfunction)    Herpes genitalis    History of echocardiogram    a. 10/2016: EF 50-55%, normal wall  motion   History of stress test    a. 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 or other cause, without mention of thyrotoxic crisis or storm     hyperthyroidism   TIA (transient ischemic attack)    Type II or unspecified type diabetes mellitus with neurological manifestations, not stated as uncontrolled(250.60)    Past Surgical History:  Procedure Laterality Date   APPENDECTOMY     COLOSTOMY     CORNEAL TRANSPLANT     feet surgery     both   HAND SURGERY     left and right   leg surgery     left and right   ORTHOPEDIC SURGERY     hands, feet, knees, legs   tubes in ears     both     Current Meds  Medication Sig   acyclovir (ZOVIRAX) 400 MG tablet Take 400 mg by mouth 2 (two) times daily.   albuterol (PROVENTIL HFA;VENTOLIN HFA) 108 (90 BASE) MCG/ACT inhaler Inhale 2 puffs into the lungs every 4 (four) hours as needed for wheezing or shortness of breath.    albuterol (PROVENTIL) (2.5 MG/3ML) 0.083% nebulizer solution Take 2.5 mg by nebulization every 4 (four) hours as needed for wheezing or shortness of breath.   aspirin EC 81 MG tablet Take 81 mg by mouth daily. Swallow whole.   BIKTARVY 50-200-25 MG TABS tablet Take 1 tablet by mouth daily.   buprenorphine (BUTRANS) 7.5 MCG/HR Place 7.5 mg onto the skin once a week. Saturday   cetirizine (ZYRTEC) 10 MG tablet Take 10 mg by mouth daily.   citalopram (CELEXA) 20 MG tablet Take 20 mg by mouth daily.   cloNIDine (CATAPRES - DOSED IN MG/24 HR) 0.3 mg/24hr patch 0.3 mg once a week.   diclofenac sodium (VOLTAREN) 1 % GEL Apply 2-4 g topically 4 (four) times daily as needed. For pain.   dicyclomine (BENTYL) 10 MG capsule Take 20 mg by mouth 2 (two) times daily before a meal.   doxycycline (VIBRAMYCIN) 100 MG capsule Take 1 capsule (100 mg total) by mouth 2 (two) times daily for 7 days.   famotidine (PEPCID) 20 MG tablet Take 20 mg by mouth daily.    feeding supplement, GLUCERNA SHAKE, (GLUCERNA SHAKE) LIQD Take 237 mLs by mouth 4 (four) times daily.   finasteride (PROSCAR) 5 MG tablet Take 5 mg by mouth daily.   fluticasone (FLONASE) 50 MCG/ACT nasal spray Place 2 sprays  into both nostrils daily.   fluticasone (FLOVENT HFA) 110 MCG/ACT inhaler Inhale 2 puffs into the lungs 2 (two) times daily.   gabapentin (NEURONTIN) 300 MG capsule Take 300 mg by mouth at bedtime.   glucose 4 GM chewable tablet Chew 1 tablet by mouth as needed for low blood sugar.   insulin degludec (TRESIBA) 100 UNIT/ML FlexTouch Pen Inject 100 Units into the skin 2 (two) times daily.   ketoconazole (NIZORAL) 2 % shampoo Apply 1 application topically 2 (two) times a week. tues and thursday   Melatonin 5 MG TABS Take  10 mg by mouth at bedtime as needed (sleep).   metFORMIN (GLUCOPHAGE-XR) 500 MG 24 hr tablet Take 1,000 mg by mouth 2 (two) times daily.   metoprolol tartrate (LOPRESSOR) 100 MG tablet Take 100 mg by mouth 2 (two) times daily.   mirtazapine (REMERON) 45 MG tablet Take 45 mg by mouth at bedtime.   Multiple Vitamin (MULTIVITAMIN) tablet Take 1 tablet by mouth daily.   nitroGLYCERIN (NITROSTAT) 0.4 MG SL tablet Place 1 tablet (0.4 mg total) under the tongue every 5 (five) minutes as needed for chest pain. Call 911 if you have taken 3 Nitro for chest pain   nortriptyline (PAMELOR) 25 MG capsule Take 50 mg by mouth at bedtime.   omega-3 acid ethyl esters (LOVAZA) 1 g capsule Take 1 g by mouth 3 (three) times daily.   ondansetron (ZOFRAN-ODT) 4 MG disintegrating tablet Take 4 mg by mouth every 8 (eight) hours as needed for nausea or vomiting.   polyethylene glycol (MIRALAX / GLYCOLAX) 17 g packet Take 17 g by mouth 3 (three) times daily.   prednisoLONE acetate (PRED FORTE) 1 % ophthalmic suspension Place 1 drop into the right eye daily.    rosuvastatin (CRESTOR) 40 MG tablet TAKE 1 TABLET(40 MG) BY MOUTH DAILY   solifenacin (VESICARE) 10 MG tablet Take 10 mg by mouth daily.   Spacer/Aero-Holding Chambers (AEROCHAMBER PLUS) inhaler Use as instructed   spironolactone (ALDACTONE) 50 MG tablet Take 50 mg by mouth daily.   tiZANidine (ZANAFLEX) 4 MG tablet Take 1 tablet (4 mg total) by mouth  3 (three) times daily.   TRULICITY 1.5 MG/0.5ML SOPN Inject 1.5 mg into the skin once a week.     Allergies:   Penicillins and Erythromycin base   Social History   Tobacco Use   Smoking status: Former    Types: Pipe    Quit date: 01/2018    Years since quitting: 3.5   Smokeless tobacco: Never  Vaping Use   Vaping Use: Some days   Substances: Nicotine, Flavoring  Substance Use Topics   Alcohol use: Not Currently    Alcohol/week: 0.0 standard drinks    Comment: used to be moderate   Drug use: No     Family Hx: The patient's He was adopted. Family history is unknown by patient.  ROS:   Please see the history of present illness.    Review of Systems  Constitutional: Negative.   Respiratory: Negative.    Cardiovascular: Negative.   Gastrointestinal: Negative.   Musculoskeletal: Negative.        Falls, leg weakness  Neurological: Negative.   Psychiatric/Behavioral: Negative.    All other systems reviewed and are negative.   Labs/Other Tests and Data Reviewed:    Recent Labs: 06/25/2021: ALT 19; BUN 17; Creatinine, Ser 1.17; Hemoglobin 16.9; Platelets 358; Potassium 4.9; Sodium 138   Recent Lipid Panel Lab Results  Component Value Date/Time   CHOL 163 10/07/2016 05:03 AM   TRIG 126 10/07/2016 05:03 AM   HDL 26 (L) 10/07/2016 05:03 AM   CHOLHDL 6.3 10/07/2016 05:03 AM   LDLCALC 112 (H) 10/07/2016 05:03 AM    Wt Readings from Last 3 Encounters:  07/11/21 172 lb (78 kg)  07/06/21 174 lb (78.9 kg)  07/04/21 174 lb (78.9 kg)     Exam:    Vital Signs: Vital signs may also be detailed in the HPI BP 100/80 (BP Location: Left Arm, Patient Position: Sitting, Cuff Size: Normal)   Pulse 86   Ht 5'  6" (1.676 m)   Wt 172 lb (78 kg)   SpO2 94%   BMI 27.76 kg/m   Constitutional:  oriented to person, place, and time. No distress.  Presents in a wheelchair HENT:  Head: Grossly normal Eyes:  no discharge. No scleral icterus.  Neck: No JVD, no carotid bruits   Cardiovascular: Regular rate and rhythm, no murmurs appreciated Pulmonary/Chest: Clear to auscultation bilaterally, no wheezes or rails Abdominal: Soft.  no distension.  no tenderness.  Musculoskeletal: Normal range of motion, muscle atrophy lower extremities Neurological:  normal muscle tone. Coordination normal. No atrophy Skin: Skin warm and dry Psychiatric: normal affect, pleasant  ASSESSMENT & PLAN:    Type 2 diabetes mellitus with complication, without long-term current use of insulin (HCC) Reports having weight loss over the past year or so Significant provement in A1c,  Reports compliance with his medications  Chest pain, unspecified type Previously healthy musculoskeletal chest wall pain No further ischemic work-up needed  Frequent falls Denies any recent falls Long history of falls, injury Wheelchair-bound, muscle atrophy lower extremities  Essential hypertension Long history difficult to control hypertension Medications were held after recent discharge from the hospital for orthostasis He reports medications were restarted by primary care On numerous blood pressure medications, List reviewed with him in detail Blood pressure low on today's visit 100 systolic right arm Taking all of his medications in the morning, recommend he move isosorbide and amlodipine to the evening with dinner Suggested he monitor blood pressure late mornings on a daily basis and call us with some numbers For continued hypotension, further medication adjustment may be needed in effort to avoid orthostatic hypotension  Obstructive sleep apnea on CPAP He reports compliance with his CPAP  Hyperlipidemia On Crestor,  Extensive review of hospital records, discussed with him and nephrology notes  Total encounter time more than 40 minutes  Greater than 50% was spent in counseling and coordination of care with the patient      Signed, Julien Nordmann, MD  07/11/2021 9:26 AM    [facility] [address], [place], [place] [address]

## 2021-07-11 ENCOUNTER — Ambulatory Visit (INDEPENDENT_AMBULATORY_CARE_PROVIDER_SITE_OTHER): Payer: Medicaid Other | Admitting: Cardiovascular Disease

## 2021-07-11 ENCOUNTER — Encounter: Payer: Self-pay | Admitting: Cardiovascular Disease

## 2021-07-11 VITALS — BP 100/80 | HR 86 | Ht 66.0 in | Wt 172.0 lb

## 2021-07-11 DIAGNOSIS — R000 Tachycardia, unspecified: Secondary | ICD-10-CM

## 2021-07-11 DIAGNOSIS — I152 Hypertension secondary to endocrine disorders: Secondary | ICD-10-CM

## 2021-07-11 DIAGNOSIS — Q743 Arthrogryposis multiplex congenita: Secondary | ICD-10-CM

## 2021-07-11 DIAGNOSIS — E1159 Type 2 diabetes mellitus with other circulatory complications: Secondary | ICD-10-CM

## 2021-07-11 DIAGNOSIS — E118 Type 2 diabetes mellitus with unspecified complications: Secondary | ICD-10-CM

## 2021-07-11 DIAGNOSIS — R42 Dizziness and giddiness: Secondary | ICD-10-CM

## 2021-07-11 DIAGNOSIS — G4733 Obstructive sleep apnea (adult) (pediatric): Secondary | ICD-10-CM | POA: Diagnosis not present

## 2021-07-11 DIAGNOSIS — E1169 Type 2 diabetes mellitus with other specified complication: Secondary | ICD-10-CM

## 2021-07-11 DIAGNOSIS — Z9989 Dependence on other enabling machines and devices: Secondary | ICD-10-CM

## 2021-07-11 DIAGNOSIS — R079 Chest pain, unspecified: Secondary | ICD-10-CM

## 2021-07-11 DIAGNOSIS — G8929 Other chronic pain: Secondary | ICD-10-CM

## 2021-07-11 DIAGNOSIS — E785 Hyperlipidemia, unspecified: Secondary | ICD-10-CM

## 2021-07-11 DIAGNOSIS — R55 Syncope and collapse: Secondary | ICD-10-CM

## 2021-07-11 NOTE — Patient Instructions (Addendum)
Medication Instructions:  Please move the amloidpine and imdur to the evening Other BP meds in the morning Please check the blood pressure late morning  If you need a refill on your cardiac medications before your next appointment, please call your pharmacy.   Lab work: No new labs needed  Testing/Procedures: No new testing needed  Follow-Up: At [facility], you and your health needs are our priority.  As part of our continuing mission to provide you with exceptional heart care, we have created designated Provider Care Teams.  These Care Teams include your primary Cardiologist (physician) and Advanced Practice Providers (APPs -  Physician Assistants and Nurse Practitioners) who all work together to provide you with the care you need, when you need it.  You will need a follow up appointment in 12 months  Providers on your designated Care Team:   Nicolasa Ducking, NP Eula Listen, PA-C Cadence Fransico Michael, [place]  COVID-19 Vaccine Information can be found at: [email or web address] For questions related to vaccine distribution or appointments, please email [email or web address] or call [phone].

## 2021-07-23 ENCOUNTER — Ambulatory Visit (INDEPENDENT_AMBULATORY_CARE_PROVIDER_SITE_OTHER): Payer: Medicaid Other

## 2021-07-23 ENCOUNTER — Ambulatory Visit
Admission: EM | Admit: 2021-07-23 | Discharge: 2021-07-23 | Disposition: A | Discharge status: Home / Self Care | Payer: Medicaid Other | Attending: Emergency Medicine | Admitting: Emergency Medicine

## 2021-07-23 ENCOUNTER — Encounter: Payer: Self-pay | Admitting: Emergency Medicine

## 2021-07-23 DIAGNOSIS — M79672 Pain in left foot: Secondary | ICD-10-CM | POA: Diagnosis not present

## 2021-07-23 NOTE — ED Provider Notes (Signed)
Patient [facility]    CSN: [id number] Arrival date & time: 07/23/21  1428      History   Chief Complaint Chief Complaint  Patient presents with   Foot Pain    left    HPI MALEK SKOG is a 41 y.o. male.   HPI  41 year old male here for evaluation of left foot pain.  The patient reports that he has been experiencing swelling and pain in his left foot and heel for the past month.  He goes on to state that last night his wife noticed that his heel was dark in color.  He states that approximately a month ago he went to the emergency department for the pain and swelling in his foot and was told that he had pulled a muscle.  Patient is wheelchair-bound.  He denies any injury.  Past Medical History:  Diagnosis Date   (HFpEF) heart failure with preserved ejection fraction (HCC)    Abrasion or friction burn of foot and toe(s), without mention of infection    Acid reflux    Adopted    Allergic rhinitis, cause unspecified    Anxiety    Arthritis    Arthrogryposis    Asthma    Bladder wall thickening    BPH (benign prostatic hyperplasia)    Chronic pain syndrome    Depressive disorder, not elsewhere classified    Diabetes mellitus without complication (HCC)    Diverticulitis of colon (without mention of hemorrhage)(562.11)    ED (erectile dysfunction)    Herpes genitalis    History of echocardiogram    a. 10/2016: EF 50-55%, normal wall motion   History of stress test    a. 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 or other cause, without mention of thyrotoxic crisis or storm     hyperthyroidism   TIA (transient ischemic attack)    Type II or unspecified type diabetes mellitus with neurological manifestations, not stated as uncontrolled(250.60)     Patient Active Problem List   Diagnosis Date Noted   Weakness    AKI (acute kidney injury) (HCC) 06/05/2021   Postural dizziness with presyncope 06/05/2021   Arthrogryposis, with contractures and functional paraplegia    Seizure disorder (HCC)    Headache    Acute asthma exacerbation 03/30/2020   Acute respiratory failure with hypoxia (HCC) 03/29/2020   Surgical wound, non healing    Acute colitis 05/20/2017   Colitis    Acute renal failure (HCC) 05/15/2017   Diarrhea 05/15/2017   Sepsis (HCC) 05/15/2017   Abdominal pain 11/25/2016   HIV (human immunodeficiency virus infection) (HCC) 11/19/2016   Hypotension 10/08/2016   Unstable angina (HCC) 10/06/2016   Shortness of breath 06/23/2015   Obesity 05/18/2014   Frequent falls 08/12/2013   Pain of left clavicle 08/12/2013   Tachycardia 01/21/2013   Chronic pain syndrome 08/12/2012   Sleep apnea 06/07/2012   Chest pain 06/20/2011   Hypertension associated with diabetes (HCC) 06/20/2011   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/20/2011  Diabetes mellitus (HCC) 06/20/2011   Asthma, chronic, unspecified asthma severity, with acute exacerbation 01/18/2008   Hyperlipidemia associated with type 2 diabetes mellitus (HCC) 08/28/2006   Essential hypertension 06/27/2005    Past Surgical History:  Procedure Laterality Date   APPENDECTOMY     COLOSTOMY     CORNEAL TRANSPLANT     feet surgery     both   HAND SURGERY     left and right   leg surgery     left and right   ORTHOPEDIC SURGERY     hands, feet, knees, legs   tubes in ears     both       Home Medications    Prior to Admission medications   Medication Sig Start Date End Date Taking? Authorizing Provider  acyclovir (ZOVIRAX) 400 MG tablet Take 400 mg by mouth 2 (two) times daily.     [provider]  albuterol (PROVENTIL HFA;VENTOLIN HFA) 108 (90 BASE) MCG/ACT inhaler Inhale 2 puffs into the lungs every 4 (four) hours as needed for wheezing or shortness of breath.     [provider]  albuterol (PROVENTIL) (2.5 MG/3ML) 0.083% nebulizer solution Take 2.5 mg by nebulization every 4 (four) hours as needed for wheezing or shortness of breath.    [provider]  alfuzosin (UROXATRAL) 10 MG 24 hr tablet Take 10 mg by mouth daily. Patient not taking: Reported on 07/11/2021    [provider]  amLODipine (NORVASC) 10 MG tablet Take 10 mg by mouth daily.    [provider]  aspirin EC 81 MG tablet Take 81 mg by mouth daily. Swallow whole.    [provider]  BIKTARVY 50-200-25 MG TABS tablet Take 1 tablet by mouth daily. 11/21/16   [provider]  buprenorphine (BUTRANS) 7.5 MCG/HR Place 7.5 mg onto the skin once a week. Saturday    [provider]  cetirizine (ZYRTEC) 10 MG tablet Take 10 mg by mouth daily.    [provider]  chlorthalidone (HYGROTON) 25 MG tablet Take 25 mg by mouth daily.    [provider]  citalopram (CELEXA) 20 MG tablet Take 20 mg by mouth daily.    [provider]  cloNIDine (CATAPRES - DOSED IN MG/24 HR) 0.3 mg/24hr patch 0.3 mg once a week. 06/13/21   [provider]  diclofenac sodium (VOLTAREN) 1 % GEL Apply 2-4 g topically 4 (four) times daily as needed. For pain.    [provider]  dicyclomine (BENTYL) 10 MG capsule Take 20 mg by mouth 2 (two) times daily before a meal. 05/01/21   [provider]  empagliflozin (JARDIANCE) 25 MG TABS tablet Take 25 mg by mouth daily. Patient not taking: Reported on 07/11/2021 01/19/20   [provider]  famotidine (PEPCID) 20 MG tablet Take 20 mg by mouth daily.  01/03/16   [provider]  feeding supplement, GLUCERNA SHAKE, (GLUCERNA SHAKE) LIQD Take 237 mLs by mouth 4 (four) times  daily.    [provider]  finasteride (PROSCAR) 5 MG tablet Take 5 mg by mouth daily.    [provider]  fluticasone (FLONASE) 50 MCG/ACT nasal spray Place 2 sprays into both nostrils daily.    [provider]  fluticasone (FLOVENT HFA) 110 MCG/ACT inhaler Inhale 2 puffs into the lungs 2 (two) times daily.    [provider]  gabapentin (NEURONTIN) 300 MG capsule Take 300 mg by mouth at bedtime.    [provider]  glucose 4 GM chewable tablet Chew 1 tablet by mouth as needed for low blood sugar.    [provider]  insulin degludec (TRESIBA) 100 UNIT/ML FlexTouch Pen Inject 100 Units into the skin 2 (two) times daily.    [provider]  isosorbide mononitrate (IMDUR) 30 MG 24 hr tablet Take 30 mg by mouth daily. 07/01/21   [provider]  ketoconazole (NIZORAL) 2 % shampoo Apply 1 application topically 2 (two) times a week. tues and thursday 08/27/14   [provider]  lisinopril (ZESTRIL) 40 MG tablet Take 40 mg by mouth daily.    [provider]  Melatonin 5 MG TABS Take 10 mg by mouth at bedtime as needed (sleep).    [provider]  metFORMIN (GLUCOPHAGE-XR) 500 MG 24 hr tablet Take 1,000 mg by mouth 2 (two) times daily.    [provider]  metoprolol tartrate (LOPRESSOR) 100 MG tablet Take 150 mg by mouth 2 (two) times daily. 07/05/21   [provider]  mirtazapine (REMERON) 45 MG tablet Take 45 mg by mouth at bedtime.    [provider]  Multiple Vitamin (MULTIVITAMIN) tablet Take 1 tablet by mouth daily.    [provider]  nitroGLYCERIN (NITROSTAT) 0.4 MG SL tablet Place 1 tablet (0.4 mg total) under the tongue every 5 (five) minutes as needed for chest pain. Call 911 if you have taken 3 Nitro for chest pain 03/14/21   Antonieta Iba, MD  nortriptyline (PAMELOR) 25 MG capsule Take 50 mg by mouth at bedtime.    [provider]  omega-3 acid ethyl  esters (LOVAZA) 1 g capsule Take 1 g by mouth 3 (three) times daily.    [provider]  ondansetron (ZOFRAN-ODT) 4 MG disintegrating tablet Take 4 mg by mouth every 8 (eight) hours as needed for nausea or vomiting.    [provider]  polyethylene glycol (MIRALAX / GLYCOLAX) 17 g packet Take 17 g by mouth 3 (three) times daily.    [provider]  prednisoLONE acetate (PRED FORTE) 1 % ophthalmic suspension Place 1 drop into the right eye daily.     [provider]  rosuvastatin (CRESTOR) 40 MG tablet TAKE 1 TABLET(40 MG) BY MOUTH DAILY 04/30/19   Antonieta Iba, MD  solifenacin (VESICARE) 10 MG tablet Take 10 mg by mouth daily.    [provider]  Spacer/Aero-Holding Chambers (AEROCHAMBER PLUS) inhaler Use as instructed 03/23/18   Domenick Gong, MD  spironolactone (ALDACTONE) 50 MG tablet Take 100 mg by mouth daily. 06/15/21   [provider]  tiZANidine (ZANAFLEX) 4 MG tablet Take 1 tablet (4 mg total) by mouth 3 (three) times daily. 07/06/21 07/06/22  Varney Daily, PA  TRULICITY 1.5 MG/0.5ML SOPN Inject 1.5 mg into the skin once a week. 01/14/20   [provider]    Family History Family History  Adopted: Yes  Family history unknown: Yes    Social History Social History   Tobacco Use   Smoking status: Former    Types: Pipe    Quit date: 01/2018    Years since quitting: 3.5   Smokeless tobacco: Never  Vaping Use   Vaping Use: Some days   Substances: Nicotine, Flavoring  Substance Use Topics   Alcohol use: Not Currently    Alcohol/week: 0.0 standard drinks of alcohol    Comment: used to be moderate   Drug use: No     Allergies   Penicillins and Erythromycin  base   Review of Systems Review of Systems  Constitutional:  Negative for fever.  Musculoskeletal:  Positive for myalgias.  Skin:  Positive for color change. Negative for wound.  Neurological:  Negative for weakness and numbness.   Hematological: Negative.   Psychiatric/Behavioral: Negative.       Physical Exam Triage Vital Signs ED Triage Vitals  Enc Vitals Group     BP 07/23/21 1443 (!) 125/91     Pulse Rate 07/23/21 1443 92     Resp 07/23/21 1443 15     Temp 07/23/21 1443 98.2 F (36.8 C)     Temp Source 07/23/21 1443 Oral     SpO2 07/23/21 1443 95 %     Weight 07/23/21 1442 171 lb 15.3 oz (78 kg)     Height --      Head Circumference --      Peak Flow --      Pain Score 07/23/21 1442 3     Pain Loc --      Pain Edu? --      Excl. in GC? --    No data found.  Updated Vital Signs BP (!) 125/91 (BP Location: Right Arm)   Pulse 92   Temp 98.2 F (36.8 C) (Oral)   Resp 15   Wt 171 lb 15.3 oz (78 kg)   SpO2 95%   BMI 27.75 kg/m   Visual Acuity Right Eye Distance:   Left Eye Distance:   Bilateral Distance:    Right Eye Near:   Left Eye Near:    Bilateral Near:     Physical Exam Vitals and nursing note reviewed.  Constitutional:      Appearance: Normal appearance. He is not ill-appearing.  HENT:     Head: Normocephalic and atraumatic.  Skin:    General: Skin is warm and dry.     Capillary Refill: Capillary refill takes less than 2 seconds.     Findings: Bruising and erythema present.  Neurological:     General: No focal deficit present.     Mental Status: He is alert and oriented to person, place, and time.  Psychiatric:        Mood and Affect: Mood normal.        Behavior: Behavior normal.        Thought Content: Thought content normal.        Judgment: Judgment normal.      UC Treatments / Results  Labs (all labs ordered are listed, but only abnormal results are displayed) Labs Reviewed - No data to display  EKG   Radiology DG Foot Complete Left  Result Date: 07/23/2021 CLINICAL DATA:  Left foot pain and swelling for 1 month EXAM: LEFT FOOT - COMPLETE 3+ VIEW COMPARISON:  None Available. 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 Hindfoot equinus and hindfoot varus alignment. No evidence of acute fracture or dislocation. No focal soft tissue swelling. IMPRESSION: 1. No evidence of acute fracture or dislocation. 2. Congenital deformity of the left hindfoot. Electronically Signed   By: Duanne Guess D.O.   On: 07/23/2021 15:29    Procedures Procedures (including critical care time)  Medications Ordered in UC Medications - No data to display  Initial Impression / Assessment and Plan / UC Course  I have reviewed the triage vital signs and the nursing notes.  Pertinent labs & imaging results that were available during my care  of the patient were reviewed by me and considered in my medical decision making (see chart for details).  Patient is a pleasant, nontoxic-appearing 41 year old male here for evaluation of pain and swelling in the left foot with a darkness noted on the left heel last night.  On exam patient's foot is mildly swollen and erythematous.  His DP and PT pulses are 2+.  His cap refill is less than 3 seconds.  He does have tenderness with palpation of the soft tissues of the entire foot.  The heel is dark in color and it appears to be a deep bruise.  The darkness is not blanchable.  It is not hot but it is tender to touch.  No crepitus appreciated.  Patient is unaware of any injury.  Will obtain radiograph to look for bony abnormality in the left foot.  Left foot x-ray independently reviewed and evaluated by me.  Impression: No evidence of fracture or dislocation.  Radiology overread is pending. 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  The hindfoot weakness and hindfoot varus are in alignment.  No evidence of acute fracture or dislocation.  No soft tissue swelling.  Allergies the patient's foot swelling,  bruising, and tenderness is unclear.  I will discharge him home and have him keep his foot elevated is much as possible, use over-the-counter Tylenol as needed for the pain, and follow-up with his PCP for any continued or worsening symptoms.  Due to the patient's history of diabetes and acute kidney injury he is unable to take NSAIDs or steroids.     Final Clinical Impressions(s) / UC Diagnoses   Final diagnoses:  Foot pain, left     Discharge Instructions      Keep your left foot elevated is much as possible to help improve swelling and relieve pain.  Take over-the-counter Tylenol according to the package instructions as needed for pain.  If your symptoms continue, or they worsen, follow-up with your primary care provider.     ED Prescriptions   None    PDMP not reviewed this encounter.   Becky Augusta, NP 07/23/21 1541

## 2021-07-23 NOTE — ED Triage Notes (Signed)
Patient c/o swelling and pain in his left heel this morning.

## 2021-07-23 NOTE — Discharge Instructions (Addendum)
Keep your left foot elevated is much as possible to help improve swelling and relieve pain.  Take over-the-counter Tylenol according to the package instructions as needed for pain.  If your symptoms continue, or they worsen, follow-up with your primary care provider.

## 2021-07-31 ENCOUNTER — Other Ambulatory Visit: Payer: Self-pay

## 2021-07-31 ENCOUNTER — Encounter: Payer: Self-pay | Admitting: Emergency Medicine

## 2021-07-31 ENCOUNTER — Ambulatory Visit
Admission: EM | Admit: 2021-07-31 | Discharge: 2021-07-31 | Disposition: A | Discharge status: Home / Self Care | Payer: Medicaid Other | Attending: Physician Assistant | Admitting: Physician Assistant

## 2021-07-31 ENCOUNTER — Ambulatory Visit (INDEPENDENT_AMBULATORY_CARE_PROVIDER_SITE_OTHER): Payer: Medicaid Other

## 2021-07-31 DIAGNOSIS — S80211A Abrasion, right knee, initial encounter: Secondary | ICD-10-CM

## 2021-07-31 DIAGNOSIS — S8001XA Contusion of right knee, initial encounter: Secondary | ICD-10-CM

## 2021-07-31 DIAGNOSIS — M25561 Pain in right knee: Secondary | ICD-10-CM

## 2021-07-31 NOTE — ED Provider Notes (Signed)
[facility]    CSN: [id number] Arrival date & time: 07/31/21  1501      History   Chief Complaint Chief Complaint  Patient presents with   Fall   Knee Pain    right    HPI Cory Campbell is a 41 y.o. male who presents with R knee pain since he fell onto cement one week ago. He was walking with his crutches and tripped and landed on his R knee. Pain is present on the medial and anterior knee area.     Past Medical History:  Diagnosis Date   (HFpEF) heart failure with preserved ejection fraction (HCC)    Abrasion or friction burn of foot and toe(s), without mention of infection    Acid reflux    Adopted    Allergic rhinitis, cause unspecified    Anxiety    Arthritis    Arthrogryposis    Asthma    Bladder wall thickening    BPH (benign prostatic hyperplasia)    Chronic pain syndrome    Depressive disorder, not elsewhere classified    Diabetes mellitus without complication (HCC)    Diverticulitis of colon (without mention of hemorrhage)(562.11)    ED (erectile dysfunction)    Herpes genitalis    History of echocardiogram    a. 10/2016: EF 50-55%, normal wall motion   History of stress test    a. 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 or other cause, without mention of thyrotoxic crisis or storm    hyperthyroidism   TIA (transient ischemic attack)    Type II or unspecified type diabetes mellitus with neurological manifestations, not stated as uncontrolled(250.60)     Patient Active Problem List   Diagnosis Date Noted   Weakness    AKI (acute kidney injury) (HCC) 06/05/2021    Postural dizziness with presyncope 06/05/2021   Arthrogryposis, with contractures and functional paraplegia    Seizure disorder (HCC)    Headache    Acute asthma exacerbation 03/30/2020   Acute respiratory failure with hypoxia (HCC) 03/29/2020   Surgical wound, non healing    Acute colitis 05/20/2017   Colitis    Acute renal failure (HCC) 05/15/2017   Diarrhea 05/15/2017   Sepsis (HCC) 05/15/2017   Abdominal pain 11/25/2016   HIV (human immunodeficiency virus infection) (HCC) 11/19/2016   Hypotension 10/08/2016   Unstable angina (HCC) 10/06/2016   Shortness of breath 06/23/2015   Obesity 05/18/2014   Frequent falls 08/12/2013   Pain of left clavicle 08/12/2013   Tachycardia 01/21/2013   Chronic pain syndrome 08/12/2012   Sleep apnea 06/07/2012   Chest pain 06/20/2011   Hypertension associated with diabetes (HCC) 06/20/2011   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/20/2011   Diabetes mellitus (HCC) 06/20/2011   Asthma, chronic, unspecified asthma severity, with acute exacerbation 01/18/2008   Hyperlipidemia associated with type 2 diabetes mellitus (HCC) 08/28/2006   Essential hypertension 06/27/2005    Past Surgical History:  Procedure Laterality Date   APPENDECTOMY     COLOSTOMY  CORNEAL TRANSPLANT     feet surgery     both   HAND SURGERY     left and right   leg surgery     left and right   ORTHOPEDIC SURGERY     hands, feet, knees, legs   tubes in ears     both       Home Medications    Prior to Admission medications   Medication Sig Start Date End Date Taking? Authorizing Provider  acyclovir (ZOVIRAX) 400 MG tablet Take 400 mg by mouth 2 (two) times daily.   Yes [provider]  albuterol (PROVENTIL HFA;VENTOLIN HFA) 108 (90 BASE) MCG/ACT inhaler Inhale 2 puffs into the lungs every 4 (four) hours as needed for wheezing or shortness of breath.    Yes [provider]  amLODipine (NORVASC) 10 MG tablet Take 10 mg by mouth  daily.   Yes [provider]  aspirin EC 81 MG tablet Take 81 mg by mouth daily. Swallow whole.   Yes [provider]  BIKTARVY 50-200-25 MG TABS tablet Take 1 tablet by mouth daily. 11/21/16  Yes [provider]  buprenorphine (BUTRANS) 7.5 MCG/HR Place 7.5 mg onto the skin once a week. Saturday   Yes [provider]  cetirizine (ZYRTEC) 10 MG tablet Take 10 mg by mouth daily.   Yes [provider]  chlorthalidone (HYGROTON) 25 MG tablet Take 25 mg by mouth daily.   Yes [provider]  citalopram (CELEXA) 20 MG tablet Take 20 mg by mouth daily.   Yes [provider]  cloNIDine (CATAPRES - DOSED IN MG/24 HR) 0.3 mg/24hr patch 0.3 mg once a week. 06/13/21  Yes [provider]  diclofenac sodium (VOLTAREN) 1 % GEL Apply 2-4 g topically 4 (four) times daily as needed. For pain.   Yes [provider]  dicyclomine (BENTYL) 10 MG capsule Take 20 mg by mouth 2 (two) times daily before a meal. 05/01/21  Yes [provider]  empagliflozin (JARDIANCE) 25 MG TABS tablet Take 25 mg by mouth daily. 01/19/20  Yes [provider]  famotidine (PEPCID) 20 MG tablet Take 20 mg by mouth daily.  01/03/16  Yes [provider]  feeding supplement, GLUCERNA SHAKE, (GLUCERNA SHAKE) LIQD Take 237 mLs by mouth 4 (four) times daily.   Yes [provider]  finasteride (PROSCAR) 5 MG tablet Take 5 mg by mouth daily.   Yes [provider]  fluticasone (FLONASE) 50 MCG/ACT nasal spray Place 2 sprays into both nostrils daily.   Yes [provider]  fluticasone (FLOVENT HFA) 110 MCG/ACT inhaler Inhale 2 puffs into the lungs 2 (two) times daily.   Yes [provider]  gabapentin (NEURONTIN) 300 MG capsule Take 300 mg by mouth at bedtime.   Yes [provider]  glucose 4 GM chewable tablet Chew 1 tablet by mouth as needed for low blood sugar.   Yes [provider]  insulin  degludec (TRESIBA) 100 UNIT/ML FlexTouch Pen Inject 100 Units into the skin 2 (two) times daily.   Yes [provider]  ketoconazole (NIZORAL) 2 % shampoo Apply 1 application topically 2 (two) times a week. tues and thursday 08/27/14  Yes [provider]  lisinopril (ZESTRIL) 40 MG tablet Take 40 mg by mouth daily.   Yes [provider]  Melatonin 5 MG TABS Take 10 mg by mouth at bedtime as needed (sleep).   Yes [provider]  metFORMIN (GLUCOPHAGE-XR) 500 MG 24  hr tablet Take 1,000 mg by mouth 2 (two) times daily.   Yes [provider]  metoprolol tartrate (LOPRESSOR) 100 MG tablet Take 150 mg by mouth 2 (two) times daily. 07/05/21  Yes [provider]  mirtazapine (REMERON) 45 MG tablet Take 45 mg by mouth at bedtime.   Yes [provider]  Multiple Vitamin (MULTIVITAMIN) tablet Take 1 tablet by mouth daily.   Yes [provider]  nitroGLYCERIN (NITROSTAT) 0.4 MG SL tablet Place 1 tablet (0.4 mg total) under the tongue every 5 (five) minutes as needed for chest pain. Call 911 if you have taken 3 Nitro for chest pain 03/14/21  Yes Gollan, Tollie Pizza, MD  nortriptyline (PAMELOR) 25 MG capsule Take 50 mg by mouth at bedtime.   Yes [provider]  omega-3 acid ethyl esters (LOVAZA) 1 g capsule Take 1 g by mouth 3 (three) times daily.   Yes [provider]  ondansetron (ZOFRAN-ODT) 4 MG disintegrating tablet Take 4 mg by mouth every 8 (eight) hours as needed for nausea or vomiting.   Yes [provider]  polyethylene glycol (MIRALAX / GLYCOLAX) 17 g packet Take 17 g by mouth 3 (three) times daily.   Yes [provider]  prednisoLONE acetate (PRED FORTE) 1 % ophthalmic suspension Place 1 drop into the right eye daily.    Yes [provider]  rosuvastatin (CRESTOR) 40 MG tablet TAKE 1 TABLET(40 MG) BY MOUTH DAILY 04/30/19  Yes Gollan, Tollie Pizza, MD  solifenacin (VESICARE) 10 MG tablet Take 10  mg by mouth daily.   Yes [provider]  Spacer/Aero-Holding Chambers (AEROCHAMBER PLUS) inhaler Use as instructed 03/23/18  Yes Domenick Gong, MD  spironolactone (ALDACTONE) 50 MG tablet Take 100 mg by mouth daily. 06/15/21  Yes [provider]  tiZANidine (ZANAFLEX) 4 MG tablet Take 1 tablet (4 mg total) by mouth 3 (three) times daily. 07/06/21 07/06/22 Yes Varney Daily, PA  TRULICITY 1.5 MG/0.5ML SOPN Inject 1.5 mg into the skin once a week. 01/14/20  Yes [provider]  albuterol (PROVENTIL) (2.5 MG/3ML) 0.083% nebulizer solution Take 2.5 mg by nebulization every 4 (four) hours as needed for wheezing or shortness of breath.    [provider]  alfuzosin (UROXATRAL) 10 MG 24 hr tablet Take 10 mg by mouth daily. Patient not taking: Reported on 07/11/2021    [provider]  isosorbide mononitrate (IMDUR) 30 MG 24 hr tablet Take 30 mg by mouth daily. 07/01/21   [provider]    Family History Family History  Adopted: Yes  Family history unknown: Yes    Social History Social History   Tobacco Use   Smoking status: Every Day    Types: Pipe, Cigars    Last attempt to quit: 01/2018    Years since quitting: 3.5   Smokeless tobacco: Never  Vaping Use   Vaping Use: Some days   Substances: Nicotine, Flavoring  Substance Use Topics   Alcohol use: Not Currently    Alcohol/week: 0.0 standard drinks of alcohol    Comment: used to be moderate   Drug use: No     Allergies   Penicillins and Erythromycin base   Review of Systems Review of Systems  Musculoskeletal:  Positive for arthralgias and joint swelling.  Skin:  Positive for wound. Negative for color change and rash.     Physical Exam Triage Vital Signs ED Triage Vitals  Enc Vitals Group     BP 07/31/21 1514 108/86  Pulse Rate 07/31/21 1514 97     Resp 07/31/21 1514 18     Temp 07/31/21 1514 98.4 F (36.9 C)     Temp Source 07/31/21 1514 Oral     SpO2  07/31/21 1514 97 %     Weight 07/31/21 1511 171 lb 15.3 oz (78 kg)     Height 07/31/21 1511 5\' 6"  (1.676 m)     Head Circumference --      Peak Flow --      Pain Score 07/31/21 1511 8     Pain Loc --      Pain Edu? --      Excl. in GC? --    No data found.  Updated Vital Signs BP 108/86 (BP Location: Left Arm)   Pulse 97   Temp 98.4 F (36.9 C) (Oral)   Resp 18   Ht 5\' 6"  (1.676 m)   Wt 171 lb 15.3 oz (78 kg)   SpO2 97%   BMI 27.75 kg/m   Visual Acuity Right Eye Distance:   Left Eye Distance:   Bilateral Distance:    Right Eye Near:   Left Eye Near:    Bilateral Near:     Physical Exam Vitals and nursing note reviewed.  Constitutional:      General: He is not in acute distress.    Comments: He is sitting on a wheelchair  HENT:     Right Ear: External ear normal.     Left Ear: External ear normal.  Pulmonary:     Effort: Pulmonary effort is normal.  Musculoskeletal:     Comments: R KNEE- has a healing abrasion on the top of the patella region which is clean and healing well. Has mild effusion in the joint area. Has local tenderness over the patella and medial joint region with palpation. ROM is limited.   Skin:    General: Skin is warm and dry.     Capillary Refill: Capillary refill takes less than 2 seconds.     Findings: No rash.  Neurological:     Mental Status: He is alert and oriented to person, place, and time.  Psychiatric:        Mood and Affect: Mood normal.        Behavior: Behavior normal.        Thought Content: Thought content normal.        Judgment: Judgment normal.      UC Treatments / Results  Labs (all labs ordered are listed, but only abnormal results are displayed) Labs Reviewed - No data to display  EKG   Radiology DG Knee Complete 4 Views Right  Result Date: 07/31/2021 CLINICAL DATA:  Larey Seat.  Left knee pain. EXAM: RIGHT KNEE - COMPLETE 4+ VIEW COMPARISON:  None Available. FINDINGS: The joint spaces are maintained. No acute  fracture is identified. Dense area of calcification near the patellar tendon likely a large area of dystrophic calcification. No obvious joint effusion. IMPRESSION: 1. No acute bony findings or joint effusion. 2. Large area of dystrophic calcification near the patellar tendon. Electronically Signed   By: Rudie Meyer M.D.   On: 07/31/2021 15:41    Procedures Procedures (including critical care time)  Medications Ordered in UC Medications - No data to display  Initial Impression / Assessment and Plan / UC Course  I have reviewed the triage vital signs and the nursing notes.  Pertinent  imaging results that were available during my care of the patient were reviewed  by me and considered in my medical decision making (see chart for details). R knee contusion and abrasion with no fracture See instructions.   Final Clinical Impressions(s) / UC Diagnoses   Final diagnoses:  Contusion of right knee, initial encounter  Abrasion of right knee, initial encounter     Discharge Instructions      Apply antibiotic ointment on the scab area twice a day to prevent infection No fractures are noted in the xray    ED Prescriptions   None    PDMP not reviewed this encounter.   Garey Ham, [place] 07/31/21 1559

## 2021-08-10 ENCOUNTER — Other Ambulatory Visit: Payer: Self-pay | Admitting: Emergency Medicine

## 2021-08-10 DIAGNOSIS — R209 Unspecified disturbances of skin sensation: Secondary | ICD-10-CM

## 2021-08-10 DIAGNOSIS — R252 Cramp and spasm: Secondary | ICD-10-CM

## 2021-08-11 ENCOUNTER — Telehealth: Payer: Self-pay | Admitting: Cardiovascular Disease

## 2021-08-11 NOTE — Telephone Encounter (Signed)
Attempted to schedule [facility].  LMOV to call office.

## 2021-08-29 ENCOUNTER — Encounter: Payer: Self-pay | Admitting: Emergency Medicine

## 2021-08-29 ENCOUNTER — Other Ambulatory Visit: Payer: Self-pay

## 2021-08-29 ENCOUNTER — Ambulatory Visit
Admission: EM | Admit: 2021-08-29 | Discharge: 2021-08-29 | Disposition: A | Discharge status: Home / Self Care | Payer: Medicaid Other | Attending: Emergency Medicine | Admitting: Emergency Medicine

## 2021-08-29 DIAGNOSIS — J0100 Acute maxillary sinusitis, unspecified: Secondary | ICD-10-CM

## 2021-08-29 MED ORDER — BENZONATATE 100 MG PO CAPS: 200.0000 mg | ORAL_CAPSULE | Freq: Three times a day (TID) | ORAL | 0 refills | Status: DC

## 2021-08-29 MED ORDER — DOXYCYCLINE HYCLATE 100 MG PO CAPS: 100.0000 mg | ORAL_CAPSULE | Freq: Two times a day (BID) | ORAL | 0 refills | Status: DC

## 2021-08-29 MED ORDER — IPRATROPIUM BROMIDE 0.06 % NA SOLN: 2.0000 | Freq: Four times a day (QID) | NASAL | 12 refills | Status: DC

## 2021-08-29 MED ORDER — PROMETHAZINE-DM 6.25-15 MG/5ML PO SYRP: 5.0000 mL | ORAL_SOLUTION | Freq: Four times a day (QID) | ORAL | 0 refills | Status: DC | PRN

## 2021-08-29 NOTE — Discharge Instructions (Signed)
The Doxycycline twice daily with food for 10 days for treatment of your sinusitis.  Perform sinus irrigation 2-3 times a day with a [employer] sinus rinse kit and distilled water.  Do not use tap water.  You can use plain over-the-counter Mucinex every 6 hours to break up the stickiness of the mucus so your body can clear it.  Increase your oral fluid intake to thin out your mucus so that is also able for your body to clear more easily.  Take an over-the-counter probiotic, such as Culturelle-align-activia, 1 hour after each dose of antibiotic to prevent diarrhea.  Use the Atrovent nasal spray, 2 squirts in each nostril every 6 hours, as needed for runny nose and postnasal drip.  Use the Tessalon Perles every 8 hours during the day.  Take them with a small sip of water.  They may give you some numbness to the base of your tongue or a metallic taste in your mouth, this is normal.  Use the Promethazine DM cough syrup at bedtime for cough and congestion.  It will make you drowsy so do not take it during the day.  If you develop any new or worsening symptoms return for reevaluation or see your primary care provider.

## 2021-08-29 NOTE — ED Provider Notes (Signed)
[facility]    CSN: [id number] Arrival date & time: 08/29/21  1629      History   Chief Complaint Chief Complaint  Patient presents with   URI    HPI Cory Campbell is a 41 y.o. male.   HPI  41 year old male here for evaluation of respiratory symptoms.  Patient reports that for the last 4 days he has been experiencing sinus pressure, especially on the left-hand side, with postnasal drip, ear pain on the left, sore throat, and a nonproductive cough.  He denies any fever, nasal discharge, shortness breath, or wheezing.  He states when he blows his nose he does have a sharp increase in pain in his left cheek.  Past Medical History:  Diagnosis Date   (HFpEF) heart failure with preserved ejection fraction (HCC)    Abrasion or friction burn of foot and toe(s), without mention of infection    Acid reflux    Adopted    Allergic rhinitis, cause unspecified    Anxiety    Arthritis    Arthrogryposis    Asthma    Bladder wall thickening    BPH (benign prostatic hyperplasia)    Chronic pain syndrome    Depressive disorder, not elsewhere classified    Diabetes mellitus without complication ([place])    Diverticulitis of colon (without mention of hemorrhage)(562.11)    ED (erectile dysfunction)    Herpes genitalis    History of echocardiogram    a. 10/2016: EF 50-55%, normal wall motion   History of stress test    a. 06/2011: significant GI uptake artifact, no evidence of ischemia, EF 56%   HIV infection (HCC)    HTN (hypertension)    Hyperlipidemia    Hypertensive cardiomyopathy ([place])    Hypogonadism in male    Myalgia and myositis, unspecified    OAB (overactive bladder)    Obesity    Other psoriasis    Premature baby    born premature   Seizure disorder ([place])    Sleep apnea    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 or other cause, without mention of thyrotoxic crisis or storm    hyperthyroidism   TIA (transient ischemic attack)    Type II or unspecified  type diabetes mellitus with neurological manifestations, not stated as uncontrolled(250.60)     Patient Active Problem List   Diagnosis Date Noted   Weakness    AKI (acute kidney injury) ([place]) 06/05/2021   Postural dizziness with presyncope 06/05/2021   Arthrogryposis, with contractures and functional paraplegia    Seizure disorder ([place])    Headache    Acute asthma exacerbation 03/30/2020   Acute respiratory failure with hypoxia ([place]) 03/29/2020   Surgical wound, non healing    Acute colitis 05/20/2017   Colitis    Acute renal failure ([place]) 05/15/2017   Diarrhea 05/15/2017   Sepsis ([place]) 05/15/2017   Abdominal pain 11/25/2016   HIV (human immunodeficiency virus infection) ([place]) 11/19/2016   Hypotension 10/08/2016   Unstable angina ([place]) 10/06/2016   Shortness of breath 06/23/2015   Obesity 05/18/2014   Frequent falls 08/12/2013   Pain of left clavicle 08/12/2013   Tachycardia 01/21/2013   Chronic pain syndrome 08/12/2012   Sleep apnea 06/07/2012   Chest pain 06/20/2011   Hypertension associated with diabetes ([place]) 06/20/2011   Insulin dependent type 2 diabetes mellitus ([place]) 06/20/2011   Diabetes mellitus ([place]) 06/20/2011   Asthma, chronic, unspecified asthma severity, with acute exacerbation 01/18/2008   Hyperlipidemia associated with type 2  diabetes mellitus (HCC) 08/28/2006   Essential hypertension 06/27/2005    Past Surgical History:  Procedure Laterality Date   APPENDECTOMY     COLOSTOMY     CORNEAL TRANSPLANT     feet surgery     both   HAND SURGERY     left and right   leg surgery     left and right   ORTHOPEDIC SURGERY     hands, feet, knees, legs   tubes in ears     both       Home Medications    Prior to Admission medications   Medication Sig Start Date End Date Taking? Authorizing Provider  benzonatate (TESSALON) 100 MG capsule Take 2 capsules (200 mg total) by mouth every 8 (eight) hours. 08/29/21  Yes Becky Augusta, NP  doxycycline  (VIBRAMYCIN) 100 MG capsule Take 1 capsule (100 mg total) by mouth 2 (two) times daily. 08/29/21  Yes Becky Augusta, NP  ipratropium (ATROVENT) 0.06 % nasal spray Place 2 sprays into both nostrils 4 (four) times daily. 08/29/21  Yes Becky Augusta, NP  promethazine-dextromethorphan (PROMETHAZINE-DM) 6.25-15 MG/5ML syrup Take 5 mLs by mouth 4 (four) times daily as needed. 08/29/21  Yes Becky Augusta, NP  acyclovir (ZOVIRAX) 400 MG tablet Take 400 mg by mouth 2 (two) times daily.    [provider]  albuterol (PROVENTIL HFA;VENTOLIN HFA) 108 (90 BASE) MCG/ACT inhaler Inhale 2 puffs into the lungs every 4 (four) hours as needed for wheezing or shortness of breath.     [provider]  albuterol (PROVENTIL) (2.5 MG/3ML) 0.083% nebulizer solution Take 2.5 mg by nebulization every 4 (four) hours as needed for wheezing or shortness of breath.    [provider]  alfuzosin (UROXATRAL) 10 MG 24 hr tablet Take 10 mg by mouth daily. Patient not taking: Reported on 07/11/2021    [provider]  amLODipine (NORVASC) 10 MG tablet Take 10 mg by mouth daily.    [provider]  aspirin EC 81 MG tablet Take 81 mg by mouth daily. Swallow whole.    [provider]  BIKTARVY 50-200-25 MG TABS tablet Take 1 tablet by mouth daily. 11/21/16   [provider]  buprenorphine (BUTRANS) 7.5 MCG/HR Place 7.5 mg onto the skin once a week. Saturday    [provider]  cetirizine (ZYRTEC) 10 MG tablet Take 10 mg by mouth daily.    [provider]  chlorthalidone (HYGROTON) 25 MG tablet Take 25 mg by mouth daily.    [provider]  citalopram (CELEXA) 20 MG tablet Take 20 mg by mouth daily.    [provider]  cloNIDine (CATAPRES - DOSED IN MG/24 HR) 0.3 mg/24hr patch 0.3 mg once a week. 06/13/21   [provider]  diclofenac sodium (VOLTAREN) 1 % GEL Apply 2-4 g topically 4 (four) times daily as needed. For pain.    [provider]  dicyclomine (BENTYL) 10 MG capsule Take 20 mg by mouth 2 (two) times daily before a meal. 05/01/21   [provider]  empagliflozin (JARDIANCE) 25 MG TABS tablet Take 25 mg by mouth daily. 01/19/20   [provider]  famotidine (PEPCID) 20 MG tablet Take 20 mg by mouth daily.  01/03/16   [provider]  feeding supplement, GLUCERNA SHAKE, (GLUCERNA SHAKE) LIQD Take 237 mLs by mouth 4 (four) times daily.    [provider]  finasteride (PROSCAR) 5 MG tablet Take 5 mg by mouth daily.  [provider]  fluticasone (FLONASE) 50 MCG/ACT nasal spray Place 2 sprays into both nostrils daily.    [provider]  fluticasone (FLOVENT HFA) 110 MCG/ACT inhaler Inhale 2 puffs into the lungs 2 (two) times daily.    [provider]  gabapentin (NEURONTIN) 300 MG capsule Take 300 mg by mouth at bedtime.    [provider]  glucose 4 GM chewable tablet Chew 1 tablet by mouth as needed for low blood sugar.    [provider]  insulin degludec (TRESIBA) 100 UNIT/ML FlexTouch Pen Inject 100 Units into the skin 2 (two) times daily.    [provider]  isosorbide mononitrate (IMDUR) 30 MG 24 hr tablet Take 30 mg by mouth daily. 07/01/21   [provider]  ketoconazole (NIZORAL) 2 % shampoo Apply 1 application topically 2 (two) times a week. tues and thursday 08/27/14   [provider]  lisinopril (ZESTRIL) 40 MG tablet Take 40 mg by mouth daily.    [provider]  Melatonin 5 MG TABS Take 10 mg by mouth at bedtime as needed (sleep).    [provider]  metFORMIN (GLUCOPHAGE-XR) 500 MG 24 hr tablet Take 1,000 mg by mouth 2 (two) times daily.    [provider]  metoprolol tartrate (LOPRESSOR) 100 MG tablet Take 150 mg by mouth 2 (two) times daily. 07/05/21   [provider]  mirtazapine (REMERON) 45 MG tablet Take 45 mg by mouth at bedtime.    [provider]  Multiple Vitamin (MULTIVITAMIN) tablet Take 1 tablet by mouth daily.    [provider]  nitroGLYCERIN (NITROSTAT) 0.4 MG SL tablet Place 1 tablet (0.4 mg total) under the tongue every 5 (five) minutes as needed for chest pain. Call 911 if you have taken 3 Nitro for chest pain 03/14/21   Minna Merritts, MD  nortriptyline (PAMELOR) 25 MG capsule Take 50 mg by mouth at bedtime.    [provider]  omega-3 acid ethyl esters (LOVAZA) 1 g capsule Take 1 g by mouth 3 (three) times daily.    [provider]  ondansetron (ZOFRAN-ODT) 4 MG disintegrating tablet Take 4 mg by mouth every 8 (eight) hours as needed for nausea or vomiting.    [provider]  polyethylene glycol (MIRALAX / GLYCOLAX) 17 g packet Take 17 g by mouth 3 (three) times daily.    [provider]  prednisoLONE acetate (PRED FORTE) 1 % ophthalmic suspension Place 1 drop into the right eye daily.     [provider]  rosuvastatin (CRESTOR) 40 MG tablet TAKE 1 TABLET(40 MG) BY MOUTH DAILY 04/30/19   Minna Merritts, MD  solifenacin (VESICARE) 10 MG tablet Take 10 mg by mouth daily.    [provider]  Spacer/Aero-Holding Chambers (AEROCHAMBER PLUS) inhaler Use as instructed 03/23/18   Melynda Ripple, MD  spironolactone (ALDACTONE) 50 MG tablet Take 100 mg by mouth daily. 06/15/21   [provider]  tiZANidine (ZANAFLEX) 4 MG tablet Take 1 tablet (4 mg total) by mouth 3 (three) times daily. 07/06/21 07/06/22  Teodoro Spray, PA  TRULICITY 1.5 [place]/7.0WC SOPN Inject 1.5 mg into the skin once a week. 01/14/20   [provider]    Family History Family History  Adopted: Yes  Family history unknown: Yes    Social History Social History   Tobacco Use   Smoking status: Every Day    Types: Pipe, Cigars    Last attempt to  quit: 01/2018    Years since quitting: 3.6   Smokeless tobacco: Never  Vaping Use   Vaping Use: Some days   Substances:  Nicotine, Flavoring  Substance Use Topics   Alcohol use: Not Currently    Alcohol/week: 0.0 standard drinks of alcohol    Comment: used to be moderate   Drug use: No     Allergies   Penicillins and Erythromycin base   Review of Systems Review of Systems  Constitutional:  Negative for fever.  HENT:  Positive for congestion, ear pain, sinus pressure and sore throat. Negative for rhinorrhea.   Respiratory:  Positive for cough. Negative for shortness of breath and wheezing.   Hematological: Negative.   Psychiatric/Behavioral: Negative.       Physical Exam Triage Vital Signs ED Triage Vitals  Enc Vitals Group     BP 08/29/21 1725 97/78     Pulse Rate 08/29/21 1725 89     Resp 08/29/21 1725 20     Temp 08/29/21 1725 98.4 F (36.9 C)     Temp Source 08/29/21 1725 Oral     SpO2 08/29/21 1725 99 %     Weight --      Height --      Head Circumference --      Peak Flow --      Pain Score 08/29/21 1722 8     Pain Loc --      Pain Edu? --      Excl. in [place]? --    No data found.  Updated Vital Signs BP 94/68 (BP Location: Left Arm) Comment (BP Location): repositioned arm  Pulse 89   Temp 98.4 F (36.9 C) (Oral)   Resp 20   SpO2 99%   Visual Acuity Right Eye Distance:   Left Eye Distance:   Bilateral Distance:    Right Eye Near:   Left Eye Near:    Bilateral Near:     Physical Exam Vitals and nursing note reviewed.  Constitutional:      Appearance: Normal appearance. He is not ill-appearing.  HENT:     Head: Normocephalic and atraumatic.     Right Ear: Tympanic membrane, ear canal and external ear normal. There is no impacted cerumen.     Left Ear: Tympanic membrane, ear canal and external ear normal. There is no impacted cerumen.     Nose: Congestion and rhinorrhea present.     Mouth/Throat:     Mouth: Mucous membranes are moist.     Pharynx: Oropharynx is clear. Posterior oropharyngeal erythema present. No oropharyngeal exudate.  Cardiovascular:     Rate  and Rhythm: Normal rate and regular rhythm.     Pulses: Normal pulses.     Heart sounds: Normal heart sounds. No murmur heard.    No friction rub. No gallop.  Pulmonary:     Effort: Pulmonary effort is normal.     Breath sounds: Normal breath sounds. No wheezing, rhonchi or rales.  Musculoskeletal:     Cervical back: Normal range of motion and neck supple.  Lymphadenopathy:     Cervical: No cervical adenopathy.  Skin:    General: Skin is warm and dry.     Capillary Refill: Capillary refill takes less than 2 seconds.     Findings: No erythema or rash.  Neurological:     General: No focal deficit present.     Mental Status: He is alert and oriented to person, place, and time.  Psychiatric:  Mood and Affect: Mood normal.        Behavior: Behavior normal.        Thought Content: Thought content normal.        Judgment: Judgment normal.      UC Treatments / Results  Labs (all labs ordered are listed, but only abnormal results are displayed) Labs Reviewed - No data to display  EKG   Radiology No results found.  Procedures Procedures (including critical care time)  Medications Ordered in UC Medications - No data to display  Initial Impression / Assessment and Plan / UC Course  I have reviewed the triage vital signs and the nursing notes.  Pertinent labs & imaging results that were available during my care of the patient were reviewed by me and considered in my medical decision making (see chart for details).  Patient is a nontoxic-appearing 41 year old male here for evaluation of respiratory complaints as outlined in HPI above.  His physical exam reveals pearly-gray tympanic membranes bilaterally.  Both external auditory canals are moderately ceruminous.  Nasal mucosa is markedly erythematous and edematous, especially on the left, with thick purulent yellow discharge in both nares.  Patient's sinuses are tender to percussion globally.  Oropharyngeal exam reveals  posterior oropharyngeal erythema and injection with yellow postnasal drip.  No cervical lymphadenopathy appreciable exam.  Cardiopulmonary exam shows S1-S2 heart sounds with regular rate and rhythm and lung sounds that are clear auscultation all fields.  Patient exam is consistent with a sinus infection.  He has an allergy to penicillin so I will place him on doxycycline twice daily for 10 days.  I have encouraged him to perform sinus irrigation to help relieve his mucus burden.  I will also prescribe Atrovent nasal spray to help with the congestion and Tessalon Perles and Promethazine DM cough syrup to help with cough and congestion.  Return precautions reviewed.   Final Clinical Impressions(s) / UC Diagnoses   Final diagnoses:  Acute non-recurrent maxillary sinusitis     Discharge Instructions      The Doxycycline twice daily with food for 10 days for treatment of your sinusitis.  Perform sinus irrigation 2-3 times a day with a [employer] sinus rinse kit and distilled water.  Do not use tap water.  You can use plain over-the-counter Mucinex every 6 hours to break up the stickiness of the mucus so your body can clear it.  Increase your oral fluid intake to thin out your mucus so that is also able for your body to clear more easily.  Take an over-the-counter probiotic, such as Culturelle-align-activia, 1 hour after each dose of antibiotic to prevent diarrhea.  Use the Atrovent nasal spray, 2 squirts in each nostril every 6 hours, as needed for runny nose and postnasal drip.  Use the Tessalon Perles every 8 hours during the day.  Take them with a small sip of water.  They may give you some numbness to the base of your tongue or a metallic taste in your mouth, this is normal.  Use the Promethazine DM cough syrup at bedtime for cough and congestion.  It will make you drowsy so do not take it during the day.  If you develop any new or worsening symptoms return for reevaluation or see your  primary care provider.      ED Prescriptions     Medication Sig Dispense Auth. Provider   doxycycline (VIBRAMYCIN) 100 MG capsule Take 1 capsule (100 mg total) by mouth 2 (two) times daily. 20 capsule  Margarette Canada, NP   benzonatate (TESSALON) 100 MG capsule Take 2 capsules (200 mg total) by mouth every 8 (eight) hours. 21 capsule Margarette Canada, NP   ipratropium (ATROVENT) 0.06 % nasal spray Place 2 sprays into both nostrils 4 (four) times daily. 15 mL Margarette Canada, NP   promethazine-dextromethorphan (PROMETHAZINE-DM) 6.25-15 MG/5ML syrup Take 5 mLs by mouth 4 (four) times daily as needed. 118 mL Margarette Canada, NP      PDMP not reviewed this encounter.   Margarette Canada, NP 08/29/21 1801

## 2021-08-29 NOTE — ED Triage Notes (Signed)
Nasal congestion started Friday, complains of sinus pressure, and complains of drainage in throat.  Feels pain when he blows his nose

## 2021-09-09 ENCOUNTER — Emergency Department: Payer: Medicaid Other

## 2021-09-09 ENCOUNTER — Other Ambulatory Visit: Payer: Self-pay

## 2021-09-09 ENCOUNTER — Inpatient Hospital Stay
Admission: EM | Admit: 2021-09-09 | Discharge: 2021-09-12 | DRG: 074 | Disposition: A | Discharge status: Home / Self Care | Payer: Medicaid Other | Attending: Internal Medicine | Admitting: Internal Medicine

## 2021-09-09 DIAGNOSIS — N400 Enlarged prostate without lower urinary tract symptoms: Secondary | ICD-10-CM | POA: Diagnosis present

## 2021-09-09 DIAGNOSIS — Z947 Corneal transplant status: Secondary | ICD-10-CM

## 2021-09-09 DIAGNOSIS — E785 Hyperlipidemia, unspecified: Secondary | ICD-10-CM | POA: Diagnosis present

## 2021-09-09 DIAGNOSIS — K219 Gastro-esophageal reflux disease without esophagitis: Secondary | ICD-10-CM | POA: Diagnosis present

## 2021-09-09 DIAGNOSIS — F419 Anxiety disorder, unspecified: Secondary | ICD-10-CM | POA: Diagnosis present

## 2021-09-09 DIAGNOSIS — E860 Dehydration: Secondary | ICD-10-CM | POA: Diagnosis present

## 2021-09-09 DIAGNOSIS — J449 Chronic obstructive pulmonary disease, unspecified: Secondary | ICD-10-CM | POA: Diagnosis present

## 2021-09-09 DIAGNOSIS — G4733 Obstructive sleep apnea (adult) (pediatric): Secondary | ICD-10-CM | POA: Diagnosis present

## 2021-09-09 DIAGNOSIS — F32A Depression, unspecified: Secondary | ICD-10-CM | POA: Diagnosis present

## 2021-09-09 DIAGNOSIS — E872 Acidosis, unspecified: Secondary | ICD-10-CM | POA: Diagnosis present

## 2021-09-09 DIAGNOSIS — Z794 Long term (current) use of insulin: Secondary | ICD-10-CM

## 2021-09-09 DIAGNOSIS — M79671 Pain in right foot: Secondary | ICD-10-CM

## 2021-09-09 DIAGNOSIS — E1169 Type 2 diabetes mellitus with other specified complication: Secondary | ICD-10-CM | POA: Diagnosis present

## 2021-09-09 DIAGNOSIS — F444 Conversion disorder with motor symptom or deficit: Secondary | ICD-10-CM | POA: Diagnosis present

## 2021-09-09 DIAGNOSIS — Z21 Asymptomatic human immunodeficiency virus [HIV] infection status: Secondary | ICD-10-CM | POA: Diagnosis present

## 2021-09-09 DIAGNOSIS — F17200 Nicotine dependence, unspecified, uncomplicated: Secondary | ICD-10-CM | POA: Diagnosis present

## 2021-09-09 DIAGNOSIS — Z888 Allergy status to other drugs, medicaments and biological substances status: Secondary | ICD-10-CM

## 2021-09-09 DIAGNOSIS — M79672 Pain in left foot: Secondary | ICD-10-CM | POA: Diagnosis not present

## 2021-09-09 DIAGNOSIS — E1159 Type 2 diabetes mellitus with other circulatory complications: Secondary | ICD-10-CM | POA: Diagnosis present

## 2021-09-09 DIAGNOSIS — Z880 Allergy status to penicillin: Secondary | ICD-10-CM

## 2021-09-09 DIAGNOSIS — A419 Sepsis, unspecified organism: Principal | ICD-10-CM

## 2021-09-09 DIAGNOSIS — E119 Type 2 diabetes mellitus without complications: Secondary | ICD-10-CM

## 2021-09-09 DIAGNOSIS — Z7984 Long term (current) use of oral hypoglycemic drugs: Secondary | ICD-10-CM

## 2021-09-09 DIAGNOSIS — E1122 Type 2 diabetes mellitus with diabetic chronic kidney disease: Secondary | ICD-10-CM | POA: Diagnosis present

## 2021-09-09 DIAGNOSIS — N1831 Chronic kidney disease, stage 3a: Secondary | ICD-10-CM | POA: Diagnosis present

## 2021-09-09 DIAGNOSIS — F17290 Nicotine dependence, other tobacco product, uncomplicated: Secondary | ICD-10-CM | POA: Diagnosis present

## 2021-09-09 DIAGNOSIS — E1140 Type 2 diabetes mellitus with diabetic neuropathy, unspecified: Principal | ICD-10-CM | POA: Diagnosis present

## 2021-09-09 DIAGNOSIS — I152 Hypertension secondary to endocrine disorders: Secondary | ICD-10-CM | POA: Diagnosis present

## 2021-09-09 DIAGNOSIS — E1165 Type 2 diabetes mellitus with hyperglycemia: Secondary | ICD-10-CM | POA: Diagnosis present

## 2021-09-09 DIAGNOSIS — M7989 Other specified soft tissue disorders: Secondary | ICD-10-CM | POA: Diagnosis present

## 2021-09-09 DIAGNOSIS — I5032 Chronic diastolic (congestive) heart failure: Secondary | ICD-10-CM | POA: Diagnosis present

## 2021-09-09 DIAGNOSIS — G894 Chronic pain syndrome: Secondary | ICD-10-CM | POA: Diagnosis present

## 2021-09-09 DIAGNOSIS — I130 Hypertensive heart and chronic kidney disease with heart failure and stage 1 through stage 4 chronic kidney disease, or unspecified chronic kidney disease: Secondary | ICD-10-CM | POA: Diagnosis present

## 2021-09-09 DIAGNOSIS — B20 Human immunodeficiency virus [HIV] disease: Secondary | ICD-10-CM | POA: Diagnosis present

## 2021-09-09 DIAGNOSIS — Z79899 Other long term (current) drug therapy: Secondary | ICD-10-CM

## 2021-09-09 DIAGNOSIS — E039 Hypothyroidism, unspecified: Secondary | ICD-10-CM | POA: Diagnosis present

## 2021-09-09 DIAGNOSIS — Q688 Other specified congenital musculoskeletal deformities: Secondary | ICD-10-CM

## 2021-09-09 DIAGNOSIS — R600 Localized edema: Secondary | ICD-10-CM

## 2021-09-09 DIAGNOSIS — G40909 Epilepsy, unspecified, not intractable, without status epilepticus: Secondary | ICD-10-CM | POA: Diagnosis present

## 2021-09-09 DIAGNOSIS — N179 Acute kidney failure, unspecified: Secondary | ICD-10-CM | POA: Diagnosis present

## 2021-09-09 DIAGNOSIS — Z7982 Long term (current) use of aspirin: Secondary | ICD-10-CM

## 2021-09-09 LAB — COMPREHENSIVE METABOLIC PANEL
ALT: 18 U/L (ref 0–44)
AST: 21 U/L (ref 15–41)
Albumin: 4.7 g/dL (ref 3.5–5.0)
Alkaline Phosphatase: 69 U/L (ref 38–126)
Anion gap: 12 (ref 5–15)
BUN: 33 mg/dL — ABNORMAL HIGH (ref 6–20)
CO2: 28 mmol/L (ref 22–32)
Calcium: 9.7 mg/dL (ref 8.9–10.3)
Chloride: 102 mmol/L (ref 98–111)
Creatinine, Ser: 1.51 mg/dL — ABNORMAL HIGH (ref 0.61–1.24)
GFR, Estimated: 59 mL/min — ABNORMAL LOW (ref >60)
Glucose, Bld: 149 mg/dL — ABNORMAL HIGH (ref 70–99)
Potassium: 3.7 mmol/L (ref 3.5–5.1)
Sodium: 142 mmol/L (ref 135–145)
Total Bilirubin: 0.6 mg/dL (ref 0.3–1.2)
Total Protein: 8.3 g/dL — ABNORMAL HIGH (ref 6.5–8.1)

## 2021-09-09 LAB — URINALYSIS, COMPLETE (UACMP) WITH MICROSCOPIC
Bacteria, UA: NONE SEEN
Bilirubin Urine: NEGATIVE
Glucose, UA: >=500 mg/dL — AB
Hgb urine dipstick: NEGATIVE
Ketones, ur: NEGATIVE mg/dL
Leukocytes,Ua: NEGATIVE
Nitrite: NEGATIVE
Protein, ur: 30 mg/dL — AB
Specific Gravity, Urine: 1.026 (ref 1.005–1.030)
pH: 5 (ref 5.0–8.0)

## 2021-09-09 LAB — GLUCOSE, CAPILLARY: Glucose-Capillary: 113 mg/dL — ABNORMAL HIGH (ref 70–99)

## 2021-09-09 LAB — CBC WITH DIFFERENTIAL/PLATELET
Abs Immature Granulocytes: 0.05 10*3/uL (ref 0.00–0.07)
Basophils Absolute: 0.1 10*3/uL (ref 0.0–0.1)
Basophils Relative: 1 %
Eosinophils Absolute: 0.1 10*3/uL (ref 0.0–0.5)
Eosinophils Relative: 1 %
HCT: 52.7 % — ABNORMAL HIGH (ref 39.0–52.0)
Hemoglobin: 16.8 g/dL (ref 13.0–17.0)
Immature Granulocytes: 1 %
Lymphocytes Relative: 31 %
Lymphs Abs: 2.2 10*3/uL (ref 0.7–4.0)
MCH: 28.5 pg (ref 26.0–34.0)
MCHC: 31.9 g/dL (ref 30.0–36.0)
MCV: 89.5 fL (ref 80.0–100.0)
Monocytes Absolute: 0.4 10*3/uL (ref 0.1–1.0)
Monocytes Relative: 5 %
Neutro Abs: 4.5 10*3/uL (ref 1.7–7.7)
Neutrophils Relative %: 61 %
Platelets: 285 10*3/uL (ref 150–400)
RBC: 5.89 MIL/uL — ABNORMAL HIGH (ref 4.22–5.81)
RDW: 13.6 % (ref 11.5–15.5)
WBC: 7.3 10*3/uL (ref 4.0–10.5)
nRBC: 0 % (ref 0.0–0.2)

## 2021-09-09 LAB — PROTIME-INR
INR: 1 (ref 0.8–1.2)
Prothrombin Time: 12.6 seconds (ref 11.4–15.2)

## 2021-09-09 LAB — APTT: aPTT: 31 seconds (ref 24–36)

## 2021-09-09 LAB — LACTIC ACID, PLASMA
Lactic Acid, Venous: 2.6 mmol/L (ref 0.5–1.9)
Lactic Acid, Venous: 2.3 mmol/L (ref 0.5–1.9)

## 2021-09-09 MED ORDER — CITALOPRAM HYDROBROMIDE 20 MG PO TABS
20.0000 mg | ORAL_TABLET | Freq: Every day | ORAL | Status: DC
  Administered 2021-09-10 – 2021-09-12 (×3): 20 mg via ORAL
  Filled 2021-09-09 (×3): qty 1

## 2021-09-09 MED ORDER — BISACODYL 5 MG PO TBEC: 5.0000 mg | DELAYED_RELEASE_TABLET | Freq: Every day | ORAL | Status: DC | PRN

## 2021-09-09 MED ORDER — CLONIDINE HCL 0.3 MG/24HR TD PTWK
0.3000 mg | MEDICATED_PATCH | TRANSDERMAL | Status: DC
Start: 2021-09-09 — End: 2021-09-12

## 2021-09-09 MED ORDER — ADULT MULTIVITAMIN W/MINERALS CH
1.0000 | ORAL_TABLET | Freq: Every day | ORAL | Status: DC
  Administered 2021-09-10 – 2021-09-12 (×3): 1 via ORAL
  Filled 2021-09-09 (×3): qty 1

## 2021-09-09 MED ORDER — ONDANSETRON HCL 4 MG PO TABS: 4.0000 mg | ORAL_TABLET | Freq: Four times a day (QID) | ORAL | Status: DC | PRN

## 2021-09-09 MED ORDER — LEVOFLOXACIN IN D5W 750 MG/150ML IV SOLN
750.0000 mg | Freq: Once | INTRAVENOUS | Status: AC
  Administered 2021-09-09: 750 mg via INTRAVENOUS
  Filled 2021-09-09: qty 150

## 2021-09-09 MED ORDER — FAMOTIDINE 20 MG PO TABS
20.0000 mg | ORAL_TABLET | Freq: Every day | ORAL | Status: DC
  Administered 2021-09-10 – 2021-09-12 (×3): 20 mg via ORAL
  Filled 2021-09-09 (×3): qty 1

## 2021-09-09 MED ORDER — FLUTICASONE PROPIONATE HFA 110 MCG/ACT IN AERO
2.0000 | INHALATION_SPRAY | Freq: Two times a day (BID) | RESPIRATORY_TRACT | Status: DC
Start: 2021-09-09 — End: 2021-09-09

## 2021-09-09 MED ORDER — POLYETHYLENE GLYCOL 3350 17 G PO PACK
17.0000 g | PACK | Freq: Three times a day (TID) | ORAL | Status: DC
  Administered 2021-09-09 – 2021-09-12 (×7): 17 g via ORAL
  Filled 2021-09-09 (×7): qty 1

## 2021-09-09 MED ORDER — ACETAMINOPHEN 325 MG PO TABS
650.0000 mg | ORAL_TABLET | Freq: Four times a day (QID) | ORAL | Status: DC | PRN
  Administered 2021-09-10 (×2): 650 mg via ORAL
  Filled 2021-09-09 (×2): qty 2

## 2021-09-09 MED ORDER — ROSUVASTATIN CALCIUM 20 MG PO TABS
40.0000 mg | ORAL_TABLET | Freq: Every day | ORAL | Status: DC
  Administered 2021-09-10 – 2021-09-12 (×3): 40 mg via ORAL
  Filled 2021-09-09 (×3): qty 2

## 2021-09-09 MED ORDER — IPRATROPIUM BROMIDE 0.06 % NA SOLN
2.0000 | Freq: Four times a day (QID) | NASAL | Status: DC
Start: 2021-09-09 — End: 2021-09-12
  Administered 2021-09-10 – 2021-09-12 (×5): 2 via NASAL
  Filled 2021-09-09: qty 15

## 2021-09-09 MED ORDER — FESOTERODINE FUMARATE ER 4 MG PO TB24
4.0000 mg | ORAL_TABLET | Freq: Every day | ORAL | Status: DC
  Administered 2021-09-10 – 2021-09-12 (×3): 4 mg via ORAL
  Filled 2021-09-09 (×3): qty 1

## 2021-09-09 MED ORDER — LACTATED RINGERS IV BOLUS (SEPSIS)
1000.0000 mL | Freq: Once | INTRAVENOUS | Status: AC
  Administered 2021-09-09: 1000 mL via INTRAVENOUS

## 2021-09-09 MED ORDER — ACETAMINOPHEN 325 MG RE SUPP: 650.0000 mg | Freq: Four times a day (QID) | RECTAL | Status: DC | PRN

## 2021-09-09 MED ORDER — INSULIN DEGLUDEC 100 UNIT/ML ~~LOC~~ SOPN
100.0000 [IU] | PEN_INJECTOR | Freq: Two times a day (BID) | SUBCUTANEOUS | Status: DC
Start: 2021-09-09 — End: 2021-09-09

## 2021-09-09 MED ORDER — VANCOMYCIN HCL IN DEXTROSE 1-5 GM/200ML-% IV SOLN
1000.0000 mg | Freq: Once | INTRAVENOUS | Status: AC
  Administered 2021-09-09: 1000 mg via INTRAVENOUS
  Filled 2021-09-09: qty 200

## 2021-09-09 MED ORDER — TIZANIDINE HCL 4 MG PO TABS
4.0000 mg | ORAL_TABLET | Freq: Three times a day (TID) | ORAL | Status: DC
  Administered 2021-09-09 – 2021-09-12 (×7): 4 mg via ORAL
  Filled 2021-09-09 (×4): qty 2
  Filled 2021-09-09: qty 1
  Filled 2021-09-09 (×5): qty 2

## 2021-09-09 MED ORDER — INSULIN ASPART 100 UNIT/ML IJ SOLN
0.0000 [IU] | Freq: Three times a day (TID) | INTRAMUSCULAR | Status: DC
  Administered 2021-09-10: 8 [IU] via SUBCUTANEOUS
  Administered 2021-09-10: 2 [IU] via SUBCUTANEOUS
  Administered 2021-09-11 (×2): 15 [IU] via SUBCUTANEOUS
  Administered 2021-09-11: 8 [IU] via SUBCUTANEOUS
  Administered 2021-09-12 (×2): 3 [IU] via SUBCUTANEOUS
  Filled 2021-09-09 (×8): qty 1

## 2021-09-09 MED ORDER — ENOXAPARIN SODIUM 40 MG/0.4ML IJ SOSY
40.0000 mg | PREFILLED_SYRINGE | INTRAMUSCULAR | Status: DC
  Administered 2021-09-09 – 2021-09-11 (×3): 40 mg via SUBCUTANEOUS
  Filled 2021-09-09 (×3): qty 0.4

## 2021-09-09 MED ORDER — DICYCLOMINE HCL 10 MG PO CAPS
20.0000 mg | ORAL_CAPSULE | Freq: Two times a day (BID) | ORAL | Status: DC
  Administered 2021-09-10 – 2021-09-12 (×5): 20 mg via ORAL
  Filled 2021-09-09 (×6): qty 2

## 2021-09-09 MED ORDER — LACTATED RINGERS IV SOLN: INTRAVENOUS | Status: DC

## 2021-09-09 MED ORDER — BUDESONIDE 0.25 MG/2ML IN SUSP
0.2500 mg | Freq: Two times a day (BID) | RESPIRATORY_TRACT | Status: DC
  Administered 2021-09-09 – 2021-09-12 (×6): 0.25 mg via RESPIRATORY_TRACT
  Filled 2021-09-09 (×6): qty 2

## 2021-09-09 MED ORDER — INSULIN GLARGINE-YFGN 100 UNIT/ML ~~LOC~~ SOLN
100.0000 [IU] | Freq: Two times a day (BID) | SUBCUTANEOUS | Status: DC
  Administered 2021-09-10 (×3): 100 [IU] via SUBCUTANEOUS
  Filled 2021-09-09 (×4): qty 1

## 2021-09-09 MED ORDER — ASPIRIN 81 MG PO TBEC
81.0000 mg | DELAYED_RELEASE_TABLET | Freq: Every day | ORAL | Status: DC
  Administered 2021-09-10 – 2021-09-12 (×3): 81 mg via ORAL
  Filled 2021-09-09 (×3): qty 1

## 2021-09-09 MED ORDER — ALBUTEROL SULFATE (2.5 MG/3ML) 0.083% IN NEBU: 2.5000 mg | INHALATION_SOLUTION | RESPIRATORY_TRACT | Status: DC | PRN

## 2021-09-09 MED ORDER — ONDANSETRON HCL 4 MG/2ML IJ SOLN
4.0000 mg | Freq: Once | INTRAMUSCULAR | Status: AC
  Administered 2021-09-09: 4 mg via INTRAVENOUS
  Filled 2021-09-09: qty 2

## 2021-09-09 MED ORDER — MELATONIN 5 MG PO TABS: 10.0000 mg | ORAL_TABLET | Freq: Every evening | ORAL | Status: DC | PRN

## 2021-09-09 MED ORDER — METOPROLOL TARTRATE 50 MG PO TABS
150.0000 mg | ORAL_TABLET | Freq: Two times a day (BID) | ORAL | Status: DC
  Administered 2021-09-10 – 2021-09-12 (×4): 150 mg via ORAL
  Filled 2021-09-09 (×5): qty 3

## 2021-09-09 MED ORDER — AMLODIPINE BESYLATE 5 MG PO TABS
10.0000 mg | ORAL_TABLET | Freq: Every day | ORAL | Status: DC
  Administered 2021-09-10 – 2021-09-12 (×3): 10 mg via ORAL
  Filled 2021-09-09 (×3): qty 2

## 2021-09-09 MED ORDER — BUPRENORPHINE 7.5 MCG/HR TD PTWK
1.0000 | MEDICATED_PATCH | TRANSDERMAL | Status: DC
  Administered 2021-09-10: 1 via TRANSDERMAL
  Filled 2021-09-09: qty 1

## 2021-09-09 MED ORDER — PREDNISOLONE ACETATE 1 % OP SUSP
1.0000 [drp] | Freq: Every day | OPHTHALMIC | Status: DC
Start: 2021-09-10 — End: 2021-09-12
  Administered 2021-09-10 – 2021-09-12 (×3): 1 [drp] via OPHTHALMIC
  Filled 2021-09-09: qty 1

## 2021-09-09 MED ORDER — LORATADINE 10 MG PO TABS
10.0000 mg | ORAL_TABLET | Freq: Every day | ORAL | Status: DC
  Administered 2021-09-10 – 2021-09-12 (×3): 10 mg via ORAL
  Filled 2021-09-09 (×3): qty 1

## 2021-09-09 MED ORDER — ONDANSETRON HCL 4 MG/2ML IJ SOLN: 4.0000 mg | Freq: Four times a day (QID) | INTRAMUSCULAR | Status: DC | PRN

## 2021-09-09 MED ORDER — NORTRIPTYLINE HCL 25 MG PO CAPS
50.0000 mg | ORAL_CAPSULE | Freq: Every day | ORAL | Status: DC
  Administered 2021-09-09 – 2021-09-11 (×3): 50 mg via ORAL
  Filled 2021-09-09 (×3): qty 2

## 2021-09-09 MED ORDER — MORPHINE SULFATE (PF) 4 MG/ML IV SOLN
4.0000 mg | Freq: Once | INTRAVENOUS | Status: AC
  Administered 2021-09-09: 4 mg via INTRAVENOUS
  Filled 2021-09-09: qty 1

## 2021-09-09 MED ORDER — LACTATED RINGERS IV SOLN
INTRAVENOUS | Status: DC
Start: 2021-09-09 — End: 2021-09-11

## 2021-09-09 MED ORDER — ACYCLOVIR 200 MG PO CAPS
400.0000 mg | ORAL_CAPSULE | Freq: Two times a day (BID) | ORAL | Status: DC
  Administered 2021-09-09 – 2021-09-12 (×6): 400 mg via ORAL
  Filled 2021-09-09 (×6): qty 2

## 2021-09-09 MED ORDER — MIRTAZAPINE 15 MG PO TABS
45.0000 mg | ORAL_TABLET | Freq: Every day | ORAL | Status: DC
  Administered 2021-09-09 – 2021-09-11 (×3): 45 mg via ORAL
  Filled 2021-09-09 (×3): qty 3

## 2021-09-09 MED ORDER — SODIUM CHLORIDE 0.9% FLUSH
3.0000 mL | Freq: Two times a day (BID) | INTRAVENOUS | Status: DC
Start: 2021-09-09 — End: 2021-09-12
  Administered 2021-09-09 – 2021-09-12 (×6): 3 mL via INTRAVENOUS

## 2021-09-09 MED ORDER — NICOTINE 14 MG/24HR TD PT24
14.0000 mg | MEDICATED_PATCH | Freq: Every day | TRANSDERMAL | Status: DC
  Administered 2021-09-10: 14 mg via TRANSDERMAL
  Filled 2021-09-09 (×2): qty 1

## 2021-09-09 MED ORDER — HYDRALAZINE HCL 20 MG/ML IJ SOLN: 5.0000 mg | INTRAMUSCULAR | Status: DC | PRN

## 2021-09-09 MED ORDER — ISOSORBIDE MONONITRATE ER 30 MG PO TB24
30.0000 mg | ORAL_TABLET | Freq: Every day | ORAL | Status: DC
  Administered 2021-09-10 – 2021-09-12 (×3): 30 mg via ORAL
  Filled 2021-09-09 (×3): qty 1

## 2021-09-09 MED ORDER — SODIUM CHLORIDE 0.9 % IV BOLUS (SEPSIS)
500.0000 mL | Freq: Once | INTRAVENOUS | Status: AC
  Administered 2021-09-09: 500 mL via INTRAVENOUS

## 2021-09-09 MED ORDER — GLUCERNA SHAKE PO LIQD
237.0000 mL | Freq: Four times a day (QID) | ORAL | Status: DC
  Administered 2021-09-10 – 2021-09-12 (×5): 237 mL via ORAL

## 2021-09-09 MED ORDER — BICTEGRAVIR-EMTRICITAB-TENOFOV 50-200-25 MG PO TABS
1.0000 | ORAL_TABLET | Freq: Every day | ORAL | Status: DC
Start: 2021-09-10 — End: 2021-09-12
  Administered 2021-09-10 – 2021-09-12 (×3): 1 via ORAL
  Filled 2021-09-09 (×3): qty 1

## 2021-09-09 MED ORDER — INSULIN ASPART 100 UNIT/ML IJ SOLN
0.0000 [IU] | Freq: Every day | INTRAMUSCULAR | Status: DC
  Administered 2021-09-11: 2 [IU] via SUBCUTANEOUS
  Filled 2021-09-09 (×2): qty 1

## 2021-09-09 MED ORDER — DOCUSATE SODIUM 100 MG PO CAPS
100.0000 mg | ORAL_CAPSULE | Freq: Two times a day (BID) | ORAL | Status: DC
  Administered 2021-09-09 – 2021-09-12 (×6): 100 mg via ORAL
  Filled 2021-09-09 (×6): qty 1

## 2021-09-09 MED ORDER — CEPHALEXIN 500 MG PO CAPS
500.0000 mg | ORAL_CAPSULE | Freq: Four times a day (QID) | ORAL | Status: DC
  Administered 2021-09-09 – 2021-09-10 (×2): 500 mg via ORAL
  Filled 2021-09-09 (×2): qty 1

## 2021-09-09 MED ORDER — GABAPENTIN 300 MG PO CAPS
300.0000 mg | ORAL_CAPSULE | Freq: Every day | ORAL | Status: DC
Start: 2021-09-09 — End: 2021-09-11
  Administered 2021-09-09 – 2021-09-10 (×2): 300 mg via ORAL
  Filled 2021-09-09 (×2): qty 1

## 2021-09-09 MED ORDER — FINASTERIDE 5 MG PO TABS
5.0000 mg | ORAL_TABLET | Freq: Every day | ORAL | Status: DC
  Administered 2021-09-10 – 2021-09-12 (×3): 5 mg via ORAL
  Filled 2021-09-09 (×3): qty 1

## 2021-09-09 MED ORDER — FLUTICASONE PROPIONATE 50 MCG/ACT NA SUSP
2.0000 | Freq: Every day | NASAL | Status: DC
  Administered 2021-09-10 – 2021-09-12 (×3): 2 via NASAL
  Filled 2021-09-09: qty 16

## 2021-09-09 NOTE — ED Triage Notes (Signed)
Patient c/o bilateral leg, feet and ankle pain for the past 3 weeks. Saw orthopedics on Tuesday for pain and was told to come to ER if pain continued. Patient is wheelchair bound.

## 2021-09-09 NOTE — Progress Notes (Signed)
PHARMACY -  BRIEF ANTIBIOTIC NOTE   Pharmacy has received consult(s) for Vancomycin and Levaquin from an ED provider.  The patient's profile has been reviewed for ht/wt/allergies/indication/available labs.    One time order(s) placed for Vancomycin 1g and Levaquin 750mg  by ED provider  Further antibiotics/pharmacy consults should be ordered by admitting physician if indicated.                       Thank you, 09/09/2021  9:16 PM

## 2021-09-09 NOTE — ED Notes (Signed)
First Nurse Note: Patient to ED via [facility] from home for bilateral leg pain x3 weeks. Pain worse today- 10/10 sharp pain. Took tylenol at 1200 with no relief. Patient uses wheelchair at baseline.

## 2021-09-09 NOTE — Sepsis Progress Note (Signed)
Elink following Code Sepsis. 

## 2021-09-09 NOTE — ED Provider Notes (Signed)
[facility] Provider Note    Event Date/Time   First MD Initiated Contact with Patient 09/09/21 1856     (approximate)   History   Leg Pain   HPI  Cory Campbell is a 41 y.o. male with history of diabetes, HIV, and arthrogryposis presents emergency department complaining of bilateral leg and foot pain.  States is a sharp stabbing pain.  Noticed swelling in his feet over the last few days.  Was seen at orthopedics who told him to come to the emergency department if worsening.  Patient takes gabapentin twice daily and states it does not help.  Denies fever or chills.      Physical Exam   Triage Vital Signs: ED Triage Vitals  Enc Vitals Group     BP 09/09/21 1850 (!) 126/94     Pulse Rate 09/09/21 1850 84     Resp 09/09/21 1850 20     Temp 09/09/21 1850 98.4 F (36.9 C)     Temp Source 09/09/21 1850 Oral     SpO2 09/09/21 1850 99 %     Weight 09/09/21 1852 171 lb 15.3 oz (78 kg)     Height 09/09/21 1852 5\' 6"  (1.676 m)     Head Circumference --      Peak Flow --      Pain Score 09/09/21 1852 10     Pain Loc --      Pain Edu? --      Excl. in [place]? --     Most recent vital signs: Vitals:   09/09/21 2030 09/09/21 2100  BP: (!) 138/96 (!) 127/96  Pulse: 78 87  Resp: 18 18  Temp:    SpO2: 96% 97%     General: Awake, no distress.   CV:  Good peripheral perfusion. regular rate and  rhythm Resp:  Normal effort.  Abd:  No distention.   Other:  Both feet are swollen with redness noted to the heel areas, broken skin noted   ED Results / Procedures / Treatments   Labs (all labs ordered are listed, but only abnormal results are displayed) Labs Reviewed  LACTIC ACID, PLASMA - Abnormal; Notable for the following components:      Result Value   Lactic Acid, Venous 2.6 (*)    All other components within normal limits  COMPREHENSIVE METABOLIC PANEL - Abnormal; Notable for the following components:   Glucose, Bld 149 (*)    BUN 33 (*)     Creatinine, Ser 1.51 (*)    Total Protein 8.3 (*)    GFR, Estimated 59 (*)    All other components within normal limits  CBC WITH DIFFERENTIAL/PLATELET - Abnormal; Notable for the following components:   RBC 5.89 (*)    HCT 52.7 (*)    All other components within normal limits  URINALYSIS, COMPLETE (UACMP) WITH MICROSCOPIC - Abnormal; Notable for the following components:   Color, Urine YELLOW (*)    APPearance CLEAR (*)    Glucose, UA >=500 (*)    Protein, ur 30 (*)    All other components within normal limits  CULTURE, BLOOD (ROUTINE X 2)  CULTURE, BLOOD (ROUTINE X 2)  URINE CULTURE  PROTIME-INR  APTT  LACTIC ACID, PLASMA     EKG     RADIOLOGY X-ray of the chest, right and left foot    PROCEDURES:   .Critical Care  Performed by: 11/09/21, PA-C Authorized by: Faythe Ghee, PA-C  Critical care provider statement:    Critical care time (minutes):  45   Critical care time was exclusive of:  Separately billable procedures and treating other patients   Critical care was necessary to treat or prevent imminent or life-threatening deterioration of the following conditions:  Cardiac failure, sepsis and renal failure   Critical care was time spent personally by me on the following activities:  Blood draw for specimens, development of treatment plan with patient or surrogate, evaluation of patient's response to treatment, examination of patient, obtaining history from patient or surrogate, ordering and performing treatments and interventions, ordering and review of laboratory studies, ordering and review of radiographic studies, re-evaluation of patient's condition and review of old charts   Care discussed with: admitting provider      MEDICATIONS ORDERED IN ED: Medications  lactated ringers infusion (has no administration in time range)  vancomycin (VANCOCIN) IVPB 1000 mg/200 mL premix (1,000 mg Intravenous New Bag/Given 09/09/21 2123)  levofloxacin (LEVAQUIN)  IVPB 750 mg (has no administration in time range)  lactated ringers bolus 1,000 mL (has no administration in time range)  sodium chloride 0.9 % bolus 500 mL (0 mLs Intravenous Stopped 09/09/21 2048)  morphine (PF) 4 MG/ML injection 4 mg (4 mg Intravenous Given 09/09/21 2049)  ondansetron (ZOFRAN) injection 4 mg (4 mg Intravenous Given 09/09/21 2049)     IMPRESSION / MDM / ASSESSMENT AND PLAN / ED COURSE  I reviewed the triage vital signs and the nursing notes.                              Differential diagnosis includes, but is not limited to, osteomyelitis, cellulitis, sepsis, diabetic foot infection, diabetic neuropathy  Patient's presentation is most consistent with acute presentation with potential threat to life or bodily function.   Due to the patient's past medical history of HIV, diabetes with redness on his feet do think we should do a sepsis work-up.  Labs and imaging ordered.  X-ray of the left and right foot independently reviewed and interpreted by me as being negative Chest x-ray independently reviewed and interpreted by me as being negative  Patient's labs are concerning for sepsis, lactic acid elevated 2.6, patient does have a mild AKI with his creatinine elevated at 1.51 and his BUN is elevated at 33, remainder of the labs are reassuring  Antibiotics for sepsis with penicillin allergy initiated, with vancomycin, LR, patient was given pain medication of morphine 4 mg IV along with Zofran 4 mg IV  Consult to hospitalist for admission  Dr. Ophelia Charter is admitting the patient.  He is in stable condition at this time.  She was informed of medications that have been started     FINAL CLINICAL IMPRESSION(S) / ED DIAGNOSES   Final diagnoses:  Acute sepsis (HCC)  AKI (acute kidney injury) (HCC)     Rx / DC Orders   ED Discharge Orders     None        Note:  This document was prepared using Dragon voice recognition software and may include unintentional dictation  errors.    Faythe Ghee, PA-C 09/09/21 2153    Sharman Cheek, MD 09/19/21 [phone]

## 2021-09-09 NOTE — Progress Notes (Signed)
CODE SEPSIS - PHARMACY COMMUNICATION  **Broad Spectrum Antibiotics should be administered within 1 hour of Sepsis diagnosis**  Time Code Sepsis Called/Page Received: 21:10  Antibiotics Ordered: Levaquin and Vancomycin  Time of 1st antibiotic administration: Vancomycin given at 21:23  Additional action taken by pharmacy:    If necessary, Name of Provider/Nurse Contacted:      Foye Deer ,PharmD Clinical Pharmacist  09/09/2021  9:17 PM

## 2021-09-09 NOTE — H&P (Signed)
History and Physical    Patient: Cory Campbell DOB: [birth year] DOA: 09/09/2021 DOS: the patient was seen and examined on 09/09/2021 PCP: Emogene MorganAycock, Ngwe A, MD  Patient coming from: Home - lives with wife and son; NOK: Wife, Tereasa CoopCrushanda Williams, [phone]   Chief Complaint: B leg and foot pain  HPI: Cory ComeCharles J Jurewicz is a 41 y.o. male with medical history significant of chronic diastolic CHF;  chronic pain with arthrogryposis; depression; BPH; DM; HIV; HTN; HLD; seizures; OSA; and hyperthyroidism presenting with B leg/foot pain.  [facility] for URI and was treated with doxy for sinusitis.  He was subsequently seen at orthopedics on 8/2 for B foot pain, L >R x 1 month.  Xrays negative, uses motorized wheelchair most of the time for ambulation.  No clear diagnosis was made.  He was placed in a CAM walker at his request and returned today with ongoing B foot pain.  He reports that the pain starts at his heels, radiates to his toes, and then moves up his legs.  The pain has been present for up to several months now and is unchanged, possibly worsening.  No fever or infectious symptoms.  He went to work today and came home and the pain was terrible so he came to the ER.  His legs and feet are red and swollen and they are concerned about this.  It hurts to touch anything, including bath water and his leg braces.  He reports that his eyes are very red because he has not taken out his contacts. This is not unusual for him.  In review of records, he [facility] on 8/2, 6/29, 6/7 (pain management), and 5/20 He has had xrays multiple times that have been negative.  He had a negative DVT US on 5/20.  He was given Neurontin for neuropathy.  He is followed by pain management for chronic pain.     ER Course:  Congenital issue, arthrogryposis.  Also DM and HIV.  2 weeks of leg pain.  Went to ortho, ?neuropathy.  Heels and feet are puffy, warm to touch  with open skin.  Sepsis work-up +.  AKI.  Lactate 2.6.  PCN allergy - given Vanc and LR.       Review of Systems: As mentioned in the history of present illness. All other systems reviewed and are negative. Past Medical History:  Diagnosis Date   (HFpEF) heart failure with preserved ejection fraction (HCC)    Acid reflux    Adopted    Anxiety    Arthritis    Arthrogryposis    Asthma    BPH (benign prostatic hyperplasia)    Chronic pain syndrome    Depressive disorder, not elsewhere classified    Diabetes mellitus without complication (HCC)    Diverticulitis of colon (without mention of hemorrhage)(562.11)    ED (erectile dysfunction)    Herpes genitalis    HIV infection (HCC)    HTN (hypertension)    Hyperlipidemia    Hypertensive cardiomyopathy (HCC)    Hypogonadism in male    OAB (overactive bladder)    Other psoriasis    Seizure disorder (HCC)    Sleep apnea    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 or other cause, without mention of thyrotoxic crisis or storm    hyperthyroidism   Past Surgical History:  Procedure Laterality Date   APPENDECTOMY     COLOSTOMY     CORNEAL  TRANSPLANT     feet surgery     both   HAND SURGERY     left and right   leg surgery     left and right   ORTHOPEDIC SURGERY     hands, feet, knees, legs   tubes in ears     both   Social History:  reports that he has been smoking pipe, cigars, and e-cigarettes. He has never used smokeless tobacco. He reports current alcohol use. He reports that he does not use drugs.  Allergies  Allergen Reactions   Penicillins Hives, Itching and Rash    Has patient had a PCN reaction causing immediate rash, facial/tongue/throat swelling, SOB or lightheadedness with hypotension: Yes Has patient had a PCN reaction causing severe rash involving mucus membranes or skin necrosis: No Has patient had a PCN reaction that required hospitalization No Has patient had a PCN reaction occurring within the last 10  years: No If all of the above answers are "NO", then may proceed with Cephalosporin use.    Erythromycin Base Itching and Rash    Family History  Adopted: Yes  Family history unknown: Yes    Prior to Admission medications   Medication Sig Start Date End Date Taking? Authorizing Provider  acyclovir (ZOVIRAX) 400 MG tablet Take 400 mg by mouth 2 (two) times daily.    [provider]  albuterol (PROVENTIL HFA;VENTOLIN HFA) 108 (90 BASE) MCG/ACT inhaler Inhale 2 puffs into the lungs every 4 (four) hours as needed for wheezing or shortness of breath.     [provider]  albuterol (PROVENTIL) (2.5 MG/3ML) 0.083% nebulizer solution Take 2.5 mg by nebulization every 4 (four) hours as needed for wheezing or shortness of breath.    [provider]  alfuzosin (UROXATRAL) 10 MG 24 hr tablet Take 10 mg by mouth daily. Patient not taking: Reported on 07/11/2021    [provider]  amLODipine (NORVASC) 10 MG tablet Take 10 mg by mouth daily.    [provider]  aspirin EC 81 MG tablet Take 81 mg by mouth daily. Swallow whole.    [provider]  benzonatate (TESSALON) 100 MG capsule Take 2 capsules (200 mg total) by mouth every 8 (eight) hours. 08/29/21   Becky Augusta, NP  BIKTARVY 50-200-25 MG TABS tablet Take 1 tablet by mouth daily. 11/21/16   [provider]  buprenorphine (BUTRANS) 7.5 MCG/HR Place 7.5 mg onto the skin once a week. Saturday    [provider]  cetirizine (ZYRTEC) 10 MG tablet Take 10 mg by mouth daily.    [provider]  chlorthalidone (HYGROTON) 25 MG tablet Take 25 mg by mouth daily.    [provider]  citalopram (CELEXA) 20 MG tablet Take 20 mg by mouth daily.    [provider]  cloNIDine (CATAPRES - DOSED IN MG/24 HR) 0.3 mg/24hr patch 0.3 mg once a week. 06/13/21   [provider]  diclofenac sodium (VOLTAREN) 1 % GEL Apply 2-4 g topically 4 (four) times daily as needed.  For pain.    [provider]  dicyclomine (BENTYL) 10 MG capsule Take 20 mg by mouth 2 (two) times daily before a meal. 05/01/21   [provider]  doxycycline (VIBRAMYCIN) 100 MG capsule Take 1 capsule (100 mg total) by mouth 2 (two) times daily. 08/29/21   Becky Augusta, NP  empagliflozin (JARDIANCE) 25 MG TABS tablet Take 25 mg by mouth daily. 01/19/20   [provider]  famotidine (  PEPCID) 20 MG tablet Take 20 mg by mouth daily.  01/03/16   [provider]  feeding supplement, GLUCERNA SHAKE, (GLUCERNA SHAKE) LIQD Take 237 mLs by mouth 4 (four) times daily.    [provider]  finasteride (PROSCAR) 5 MG tablet Take 5 mg by mouth daily.    [provider]  fluticasone (FLONASE) 50 MCG/ACT nasal spray Place 2 sprays into both nostrils daily.    [provider]  fluticasone (FLOVENT HFA) 110 MCG/ACT inhaler Inhale 2 puffs into the lungs 2 (two) times daily.    [provider]  gabapentin (NEURONTIN) 300 MG capsule Take 300 mg by mouth at bedtime.    [provider]  glucose 4 GM chewable tablet Chew 1 tablet by mouth as needed for low blood sugar.    [provider]  insulin degludec (TRESIBA) 100 UNIT/ML FlexTouch Pen Inject 100 Units into the skin 2 (two) times daily.    [provider]  ipratropium (ATROVENT) 0.06 % nasal spray Place 2 sprays into both nostrils 4 (four) times daily. 08/29/21   Becky Augusta, NP  isosorbide mononitrate (IMDUR) 30 MG 24 hr tablet Take 30 mg by mouth daily. 07/01/21   [provider]  ketoconazole (NIZORAL) 2 % shampoo Apply 1 application topically 2 (two) times a week. tues and thursday 08/27/14   [provider]  lisinopril (ZESTRIL) 40 MG tablet Take 40 mg by mouth daily.    [provider]  Melatonin 5 MG TABS Take 10 mg by mouth at bedtime as needed (sleep).    [provider]  metFORMIN (GLUCOPHAGE-XR) 500 MG 24 hr tablet Take 1,000  mg by mouth 2 (two) times daily.    [provider]  metoprolol tartrate (LOPRESSOR) 100 MG tablet Take 150 mg by mouth 2 (two) times daily. 07/05/21   [provider]  mirtazapine (REMERON) 45 MG tablet Take 45 mg by mouth at bedtime.    [provider]  Multiple Vitamin (MULTIVITAMIN) tablet Take 1 tablet by mouth daily.    [provider]  nitroGLYCERIN (NITROSTAT) 0.4 MG SL tablet Place 1 tablet (0.4 mg total) under the tongue every 5 (five) minutes as needed for chest pain. Call 911 if you have taken 3 Nitro for chest pain 03/14/21   Antonieta Iba, MD  nortriptyline (PAMELOR) 25 MG capsule Take 50 mg by mouth at bedtime.    [provider]  omega-3 acid ethyl esters (LOVAZA) 1 g capsule Take 1 g by mouth 3 (three) times daily.    [provider]  ondansetron (ZOFRAN-ODT) 4 MG disintegrating tablet Take 4 mg by mouth every 8 (eight) hours as needed for nausea or vomiting.    [provider]  polyethylene glycol (MIRALAX / GLYCOLAX) 17 g packet Take 17 g by mouth 3 (three) times daily.    [provider]  prednisoLONE acetate (PRED FORTE) 1 % ophthalmic suspension Place 1 drop into the right eye daily.     [provider]  promethazine-dextromethorphan (PROMETHAZINE-DM) 6.25-15 MG/5ML syrup Take 5 mLs by mouth 4 (four) times daily as needed. 08/29/21   Becky Augusta, NP  rosuvastatin (CRESTOR) 40 MG tablet TAKE 1 TABLET(40 MG) BY MOUTH DAILY 04/30/19   Antonieta Iba, MD  solifenacin (VESICARE) 10 MG tablet Take 10 mg by mouth daily.    [provider]  Spacer/Aero-Holding Chambers (AEROCHAMBER PLUS) inhaler Use as instructed 03/23/18   Domenick Gong, MD  spironolactone (ALDACTONE) 50 MG tablet  Take 100 mg by mouth daily. 06/15/21   [provider]  tiZANidine (ZANAFLEX) 4 MG tablet Take 1 tablet (4 mg total) by mouth 3 (three) times daily. 07/06/21 07/06/22  Varney Daily, PA  TRULICITY 1.5  MG/0.5ML SOPN Inject 1.5 mg into the skin once a week. 01/14/20   [provider]    Physical Exam: Vitals:   09/09/21 2100 09/09/21 2215 09/09/21 2227 09/09/21 2304  BP: (!) 127/96   (!) 140/108  Pulse: 87 75  78  Resp: 18 16  20   Temp:   98.3 F (36.8 C) 97.7 F (36.5 C)  TempSrc:   Oral Oral  SpO2: 97% 99%  100%  Weight:    70 kg  Height:    5\' 7"  (1.702 m)   General:  Appears calm and comfortable and is in NAD, chronically disabled Eyes:   EOMI, normal lids, very injected conjunctivae ENT:  grossly normal hearing, lips & tongue, mmm; suboptimal dentition Neck:  no LAD, masses or thyromegaly Cardiovascular:  RRR, no m/r/g. No LE edema.  Respiratory:   CTA bilaterally with no wheezes/rales/rhonchi.  Normal respiratory effort. Abdomen:  soft, NT, ND, surgical scarring creating wall deformity Skin:  chronic atrophy of LE with marked edema and erythema of the R > L heels and extending up the feet to the lower legs    Musculoskeletal:  decreased strength BUE < BLE, missing finger digits Psychiatric:  grossly normal mood and affect, speech fluent and appropriate, AOx3 Neurologic:  CN 2-12 grossly intact, moves all extremities in coordinated fashion   Radiological Exams on Admission: Independently reviewed - see discussion in A/P where applicable  DG Foot Complete Right  Result Date: 09/09/2021 CLINICAL DATA:  Foot pain and swelling EXAM: RIGHT FOOT COMPLETE - 3+ VIEW; LEFT FOOT - COMPLETE 3+ VIEW COMPARISON:  None Available. FINDINGS: There is no evidence of fracture or dislocation. Chronic, non weight-bearing deformities of the bilateral mid and hind feet. There is no evidence of arthropathy. Soft tissues are unremarkable. IMPRESSION: No fracture or dislocation of the bilateral feet. Chronic, non weight-bearing deformities. Soft tissues unremarkable. Electronically Signed   By: M.D.   On: 09/09/2021 19:36   DG Foot Complete Left  Result Date:  09/09/2021 CLINICAL DATA:  Foot pain and swelling EXAM: RIGHT FOOT COMPLETE - 3+ VIEW; LEFT FOOT - COMPLETE 3+ VIEW COMPARISON:  None Available. FINDINGS: There is no evidence of fracture or dislocation. Chronic, non weight-bearing deformities of the bilateral mid and hind feet. There is no evidence of arthropathy. Soft tissues are unremarkable. IMPRESSION: No fracture or dislocation of the bilateral feet. Chronic, non weight-bearing deformities. Soft tissues unremarkable. Electronically Signed   By: 11/09/2021 M.D.   On: 09/09/2021 19:36   DG Chest Port 1 View  Result Date: 09/09/2021 CLINICAL DATA:  Questionable sepsis EXAM: PORTABLE CHEST 1 VIEW COMPARISON:  06/05/2021 FINDINGS: The heart size and mediastinal contours are within normal limits. Both lungs are clear. The visualized skeletal structures are unremarkable. IMPRESSION: No acute abnormality of the lungs in AP portable projection. Electronically Signed   By: 11/09/2021 M.D.   On: 09/09/2021 19:33    EKG: Independently reviewed.  NSR with rate 80; nonspecific ST changes with no evidence of acute ischemia   Labs on Admission: I have personally reviewed the available labs and imaging studies at the time of the admission.  Pertinent labs:    Glucose 149 BUN 33/Creatinine 1.51/GFR 59 Lactate 2.6, 2.3 WBC 7.3  INR 1.0 UA: >500 glucose, 30 protein Blood and urine cultures pending   Assessment and Plan: Principal Problem:   Bilateral foot pain Active Problems:   AKI (acute kidney injury) (HCC)   Hypertension associated with diabetes (HCC)   Insulin dependent type 2 diabetes mellitus (HCC)   Chronic pain syndrome   Hyperlipidemia associated with type 2 diabetes mellitus (HCC)   HIV (human immunodeficiency virus infection) (HCC)   Arthrogryposis, with contractures and functional paraplegia   Lactic acidosis   Tobacco dependence    B foot pain with underlying arthrogryposis -Patient with arthrogryposis and chronic pain  syndrome presenting with months of BLE pain -It is not clear by the history that this is worsening or different from baseline -In the ER, the EDP was concerned about the appearance of his feet (erythema, edema) and so ordered a lactate that was positive -He does not have apparent SIRS criteria or other evidence of sepsis -If anything this may be cellulitis - likely superimposed on neuropathy -Will observe overnight on telemetry -By the cellulitis order set, this would be mild nonpurulent cellulitis and treatment with PO Keflex would be appropriate (prior h/o PCN allergy so discussed with patient and pharmacy and he has tolerated this in the past); he was given Vanc and Levaquin in the ER but will not continue currently -Of note, he just completed doxy for sinusitis today -His underlying issue appears more likely related to his arthrogryposis and/or neuropathy and ongoing pain management and orthopedics care is most appropriate for this -Consider more extensive imaging if not improving  AKI vs. 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 and so is likely at baseline now -No evidence of ketonuria on UA -Will gently hydrate overnight and recheck BMP in AM -For now, will hold chlorthalidone, Jardiance, spironolactone, and lisinopril  Lactic acidosis -Ruled out for sepsis - no SIRS criteria -Currently thought to be related to dehydration, possible infection -Will trend lactate -Blood cultures pending  HIV -Continue Biktarvy, Acyclovir -CD4 in March >500, VL undetectable  DM -Will check A1c; was 6.9 in 02/2021 -hold Jardiance, metformin, Trulicity -Continue Tresiba/glargine -Cover with moderate-scale SSI   Chronic pain -I have reviewed this patient in the [place] Controlled Substances Reporting System.  He is receiving medications from only one provider and appears to be taking them as prescribed. -He is not at particularly high risk of opioid misuse, diversion, or overdose.  -Continue  Butrans patch, citalopram, gabapentin (consider increase in dose), melatonin, mirtazepine, nortriptyline, tizanidine  Chronic diastolic CHF -Continue ASA, Imdur -Hold aldactone -Appears compensated at this time  HTN -Continue amlodipine, Catapres, metoprolol -Hold chlorthalidone and lisinopril for now  HLD -Continue Crestor -Hold Lovaza due to limited inpatient utility  Tobacco dependence with COPD -Encourage cessation.   -This was discussed with the patient and should be reviewed on an ongoing basis.   -Patch ordered -Continue albuterol prn, Flovent    Advance Care Planning:   Code Status: Full Code   Consults: None  DVT Prophylaxis: Lovenox  Family Communication: Wife was present on the telephone throughout the encounter  Severity of Illness: The appropriate patient status for this patient is OBSERVATION. Observation status is judged to be reasonable and necessary in order to provide the required intensity of service to ensure the patient's safety. The patient's presenting symptoms, physical exam findings, and initial radiographic and laboratory data in the context of their medical condition is felt to place them at decreased risk for further clinical deterioration. Furthermore, it is anticipated that  the patient will be medically stable for discharge from the hospital within 2 midnights of admission.   Author: Jonah Blue, MD 09/09/2021 11:15 PM  For on call review [email or web address].

## 2021-09-10 DIAGNOSIS — Z947 Corneal transplant status: Secondary | ICD-10-CM | POA: Diagnosis not present

## 2021-09-10 DIAGNOSIS — E1122 Type 2 diabetes mellitus with diabetic chronic kidney disease: Secondary | ICD-10-CM | POA: Diagnosis present

## 2021-09-10 DIAGNOSIS — I5032 Chronic diastolic (congestive) heart failure: Secondary | ICD-10-CM | POA: Diagnosis present

## 2021-09-10 DIAGNOSIS — E860 Dehydration: Secondary | ICD-10-CM | POA: Diagnosis present

## 2021-09-10 DIAGNOSIS — N179 Acute kidney failure, unspecified: Secondary | ICD-10-CM

## 2021-09-10 DIAGNOSIS — I130 Hypertensive heart and chronic kidney disease with heart failure and stage 1 through stage 4 chronic kidney disease, or unspecified chronic kidney disease: Secondary | ICD-10-CM | POA: Diagnosis present

## 2021-09-10 DIAGNOSIS — E039 Hypothyroidism, unspecified: Secondary | ICD-10-CM | POA: Diagnosis present

## 2021-09-10 DIAGNOSIS — E119 Type 2 diabetes mellitus without complications: Secondary | ICD-10-CM

## 2021-09-10 DIAGNOSIS — E872 Acidosis, unspecified: Secondary | ICD-10-CM

## 2021-09-10 DIAGNOSIS — E1169 Type 2 diabetes mellitus with other specified complication: Secondary | ICD-10-CM | POA: Diagnosis present

## 2021-09-10 DIAGNOSIS — M79671 Pain in right foot: Secondary | ICD-10-CM

## 2021-09-10 DIAGNOSIS — G894 Chronic pain syndrome: Secondary | ICD-10-CM

## 2021-09-10 DIAGNOSIS — F17290 Nicotine dependence, other tobacco product, uncomplicated: Secondary | ICD-10-CM | POA: Diagnosis present

## 2021-09-10 DIAGNOSIS — Z21 Asymptomatic human immunodeficiency virus [HIV] infection status: Secondary | ICD-10-CM | POA: Diagnosis present

## 2021-09-10 DIAGNOSIS — F32A Depression, unspecified: Secondary | ICD-10-CM | POA: Diagnosis present

## 2021-09-10 DIAGNOSIS — L0889 Other specified local infections of the skin and subcutaneous tissue: Secondary | ICD-10-CM | POA: Diagnosis not present

## 2021-09-10 DIAGNOSIS — F419 Anxiety disorder, unspecified: Secondary | ICD-10-CM | POA: Diagnosis present

## 2021-09-10 DIAGNOSIS — Q688 Other specified congenital musculoskeletal deformities: Secondary | ICD-10-CM | POA: Diagnosis not present

## 2021-09-10 DIAGNOSIS — J449 Chronic obstructive pulmonary disease, unspecified: Secondary | ICD-10-CM | POA: Diagnosis present

## 2021-09-10 DIAGNOSIS — E1140 Type 2 diabetes mellitus with diabetic neuropathy, unspecified: Secondary | ICD-10-CM | POA: Diagnosis present

## 2021-09-10 DIAGNOSIS — G4733 Obstructive sleep apnea (adult) (pediatric): Secondary | ICD-10-CM | POA: Diagnosis present

## 2021-09-10 DIAGNOSIS — Z794 Long term (current) use of insulin: Secondary | ICD-10-CM | POA: Diagnosis not present

## 2021-09-10 DIAGNOSIS — E1165 Type 2 diabetes mellitus with hyperglycemia: Secondary | ICD-10-CM | POA: Diagnosis present

## 2021-09-10 DIAGNOSIS — N1831 Chronic kidney disease, stage 3a: Secondary | ICD-10-CM | POA: Diagnosis present

## 2021-09-10 DIAGNOSIS — B20 Human immunodeficiency virus [HIV] disease: Secondary | ICD-10-CM

## 2021-09-10 DIAGNOSIS — M79672 Pain in left foot: Secondary | ICD-10-CM

## 2021-09-10 DIAGNOSIS — G40909 Epilepsy, unspecified, not intractable, without status epilepticus: Secondary | ICD-10-CM | POA: Diagnosis present

## 2021-09-10 DIAGNOSIS — N400 Enlarged prostate without lower urinary tract symptoms: Secondary | ICD-10-CM | POA: Diagnosis present

## 2021-09-10 DIAGNOSIS — Z880 Allergy status to penicillin: Secondary | ICD-10-CM | POA: Diagnosis not present

## 2021-09-10 LAB — C-REACTIVE PROTEIN: CRP: 1.4 mg/dL — ABNORMAL HIGH (ref <1.0)

## 2021-09-10 LAB — HEMOGLOBIN A1C
Hgb A1c MFr Bld: 7.8 % — ABNORMAL HIGH (ref 4.8–5.6)
Mean Plasma Glucose: 177.16 mg/dL

## 2021-09-10 LAB — CBC
HCT: 49.6 % (ref 39.0–52.0)
Hemoglobin: 16.2 g/dL (ref 13.0–17.0)
MCH: 29 pg (ref 26.0–34.0)
MCHC: 32.7 g/dL (ref 30.0–36.0)
MCV: 88.7 fL (ref 80.0–100.0)
Platelets: 231 10*3/uL (ref 150–400)
RBC: 5.59 MIL/uL (ref 4.22–5.81)
RDW: 13.5 % (ref 11.5–15.5)
WBC: 5.9 10*3/uL (ref 4.0–10.5)
nRBC: 0 % (ref 0.0–0.2)

## 2021-09-10 LAB — LACTIC ACID, PLASMA
Lactic Acid, Venous: 3.4 mmol/L (ref 0.5–1.9)
Lactic Acid, Venous: 2.8 mmol/L (ref 0.5–1.9)
Lactic Acid, Venous: 1.3 mmol/L (ref 0.5–1.9)
Lactic Acid, Venous: 1.5 mmol/L (ref 0.5–1.9)

## 2021-09-10 LAB — BASIC METABOLIC PANEL
Anion gap: 12 (ref 5–15)
BUN: 25 mg/dL — ABNORMAL HIGH (ref 6–20)
CO2: 26 mmol/L (ref 22–32)
Calcium: 9.2 mg/dL (ref 8.9–10.3)
Chloride: 103 mmol/L (ref 98–111)
Creatinine, Ser: 1.27 mg/dL — ABNORMAL HIGH (ref 0.61–1.24)
GFR, Estimated: >60 mL/min (ref >60)
Glucose, Bld: 156 mg/dL — ABNORMAL HIGH (ref 70–99)
Potassium: 3.7 mmol/L (ref 3.5–5.1)
Sodium: 141 mmol/L (ref 135–145)

## 2021-09-10 LAB — GLUCOSE, CAPILLARY
Glucose-Capillary: 111 mg/dL — ABNORMAL HIGH (ref 70–99)
Glucose-Capillary: 122 mg/dL — ABNORMAL HIGH (ref 70–99)
Glucose-Capillary: 274 mg/dL — ABNORMAL HIGH (ref 70–99)
Glucose-Capillary: 431 mg/dL — ABNORMAL HIGH (ref 70–99)

## 2021-09-10 LAB — SEDIMENTATION RATE: Sed Rate: 5 mm/hr (ref 0–15)

## 2021-09-10 LAB — URIC ACID: Uric Acid, Serum: 6.3 mg/dL (ref 3.7–8.6)

## 2021-09-10 MED ORDER — HYDROMORPHONE HCL 2 MG PO TABS
2.0000 mg | ORAL_TABLET | Freq: Once | ORAL | Status: AC
  Administered 2021-09-11: 2 mg via ORAL
  Filled 2021-09-10: qty 1

## 2021-09-10 MED ORDER — IBUPROFEN 400 MG PO TABS
600.0000 mg | ORAL_TABLET | Freq: Three times a day (TID) | ORAL | Status: DC
Start: 2021-09-10 — End: 2021-09-12
  Administered 2021-09-10 – 2021-09-12 (×6): 600 mg via ORAL
  Filled 2021-09-10 (×6): qty 2

## 2021-09-10 MED ORDER — METHYLPREDNISOLONE SODIUM SUCC 40 MG IJ SOLR
40.0000 mg | Freq: Two times a day (BID) | INTRAMUSCULAR | Status: DC
  Administered 2021-09-10 (×2): 40 mg via INTRAVENOUS
  Filled 2021-09-10 (×2): qty 1

## 2021-09-10 MED ORDER — INSULIN ASPART 100 UNIT/ML IJ SOLN
20.0000 [IU] | Freq: Once | INTRAMUSCULAR | Status: AC
  Administered 2021-09-10: 20 [IU] via SUBCUTANEOUS
  Filled 2021-09-10: qty 1

## 2021-09-10 NOTE — Progress Notes (Signed)
[facility] Progress Note  Patient: Cory Campbell     MFI:[id number]  DOA: 09/09/2021   PCP: Emogene Morgan, MD       Brief hospital course: 41 year old male with arthrogyrposis with inability to ambulate long distances, chronic pain, diastolic heart failure, HIV, diabetes mellitus, OSA, hypothyroidism, BPH, hypertension, depression. The patient presented to the ED with pain in both feet.  He recently finished a course of doxycycline for sinusitis.  He went to see his orthopedic surgeon on 8/2 for the foot pain and x-ray imaging was done which was negative. In the ED: Lactic acid noted to be elevated at 2.6 followed by 3.4 and 2.8 It was suspected that he may have an infection and started on antibiotics.  Subjective:  The patient states that he has no change in the pain and swelling of his feet.  Assessment and Plan: Principal Problem:   Bilateral foot pain with swelling - Feet are swollen and severely tender to light touch - xray images negative - As antibiotics have not helped I will discontinue them -? Auto immune, check ESR, CRP and ANA- does not appears to be gout but will check lactic acid - have asked for an ortho consult as well, dr Okey Dupre  Active Problems:   AKI (acute kidney injury), dehydration and lactic acidosis - Cr 0.9 - 1.1 at baseline - Cr 1.51 when admitted- Cr improving - Lactic acid not normal at 1.5 - presented with AKI on 06/05/21 as well - on Chlorthalidone and Spironolactone as outpt which are on hold and may need to be discontinued - holding Lisinopril     Insulin dependent type 2 diabetes mellitus (HCC) - A1c 7.8 - on Semglee and SSI - holding Jardiance, Metformin and Trulicity    Chronic pain syndrome - cont Tizanidine and Gabapentin - received Buprenorphine as outpt  HTN - cont Amlodipine, Metoprolol, Catapres and Imdur    HIV (human immunodeficiency virus infection) (HCC) - last CD 4 count in 3/23 was 741 - cont Bictarvy  -  cont Acyclovir    Arthrogryposis, with contractures and functional paraparesis  - atrophic legs- - he is able to ambulate around his home with his leg braces and crutches but uses an electric wheelchair for longer distances     Tobacco dependence   DVT prophylaxis:  enoxaparin (LOVENOX) injection 40 mg Start: 09/09/21 2300     Code Status: Full Code  Consultants: ortho Level of Care: Level of care: Med-Surg Disposition Plan:  Status is: Observation The patient will require care spanning > 2 midnights and should be moved to inpatient because: swelling of feet with pain- work up underway  Objective:   Vitals:   09/09/21 2304 09/10/21 0316 09/10/21 0720 09/10/21 0853  BP: (!) 140/108 (!) 125/103  (!) 125/94  Pulse: 78 96  94  Resp: 20 16  17   Temp: 97.7 F (36.5 C) 98 F (36.7 C)  98.2 F (36.8 C)  TempSrc: Oral Oral  Oral  SpO2: 100% 100% 100% (!) 88%  Weight: 70 kg     Height: 5\' 7"  (1.702 m)      Filed Weights   09/09/21 1852 09/09/21 2304  Weight: 78 kg 70 kg   Exam: General exam: Appears comfortable  HEENT: PERRLA, oral mucosa moist, no sclera icterus or thrush Respiratory system: Clear to auscultation. Respiratory effort normal. Cardiovascular system: S1 & S2 heard, regular rate and rhythm Gastrointestinal system: Abdomen soft, non-tender, nondistended. Normal bowel sounds   Central nervous  system: Alert and oriented. No focal neurological deficits. Extremities: No cyanosis, clubbing - atrophic legs- diffuse swelling of feet and ankles L > R  Skin: No rashes or ulcers Psychiatry:  Mood & affect appropriate.    Imaging and lab data was personally reviewed    CBC: Recent Labs  Lab 09/09/21 2000 09/10/21 0453  WBC 7.3 5.9  NEUTROABS 4.5  --   HGB 16.8 16.2  HCT 52.7* 49.6  MCV 89.5 88.7  PLT 285 063   Basic Metabolic Panel: Recent Labs  Lab 09/09/21 2000 09/10/21 0453  NA 142 141  K 3.7 3.7  CL 102 103  CO2 28 26  GLUCOSE 149* 156*  BUN 33*  25*  CREATININE 1.51* 1.27*  CALCIUM 9.7 9.2   GFR: Estimated Creatinine Clearance: 71.6 mL/min (A) (by C-G formula based on SCr of 1.27 mg/dL (H)).  Scheduled Meds:  acyclovir  400 mg Oral BID   amLODipine  10 mg Oral Daily   aspirin EC  81 mg Oral Daily   bictegravir-emtricitabine-tenofovir AF  1 tablet Oral Daily   budesonide (PULMICORT) nebulizer solution  0.25 mg Nebulization BID   buprenorphine  1 patch Transdermal Weekly   citalopram  20 mg Oral Daily   cloNIDine  0.3 mg Transdermal Weekly   dicyclomine  20 mg Oral BID AC   docusate sodium  100 mg Oral BID   enoxaparin (LOVENOX) injection  40 mg Subcutaneous Q24H   famotidine  20 mg Oral Daily   feeding supplement (GLUCERNA SHAKE)  237 mL Oral QID   fesoterodine  4 mg Oral Daily   finasteride  5 mg Oral Daily   fluticasone  2 spray Each Nare Daily   gabapentin  300 mg Oral QHS   insulin aspart  0-15 Units Subcutaneous TID WC   insulin aspart  0-5 Units Subcutaneous QHS   insulin glargine-yfgn  100 Units Subcutaneous BID   ipratropium  2 spray Each Nare QID   isosorbide mononitrate  30 mg Oral Daily   loratadine  10 mg Oral Daily   methylPREDNISolone (SOLU-MEDROL) injection  40 mg Intravenous BID   metoprolol tartrate  150 mg Oral BID   mirtazapine  45 mg Oral QHS   multivitamin with minerals  1 tablet Oral Daily   nicotine  14 mg Transdermal Daily   nortriptyline  50 mg Oral QHS   polyethylene glycol  17 g Oral TID   prednisoLONE acetate  1 drop Right Eye Daily   rosuvastatin  40 mg Oral Daily   sodium chloride flush  3 mL Intravenous Q12H   tiZANidine  4 mg Oral TID   Continuous Infusions:  lactated ringers 75 mL/hr at 09/09/21 2358     LOS: 0 days   Author: Eunice Blase Dennice Tindol  09/10/2021 2:23 PM

## 2021-09-11 ENCOUNTER — Inpatient Hospital Stay: Payer: Medicaid Other

## 2021-09-11 DIAGNOSIS — L0889 Other specified local infections of the skin and subcutaneous tissue: Secondary | ICD-10-CM | POA: Diagnosis not present

## 2021-09-11 DIAGNOSIS — M79672 Pain in left foot: Secondary | ICD-10-CM | POA: Diagnosis not present

## 2021-09-11 DIAGNOSIS — Q688 Other specified congenital musculoskeletal deformities: Secondary | ICD-10-CM | POA: Diagnosis not present

## 2021-09-11 DIAGNOSIS — M79671 Pain in right foot: Secondary | ICD-10-CM

## 2021-09-11 DIAGNOSIS — N179 Acute kidney failure, unspecified: Secondary | ICD-10-CM | POA: Diagnosis not present

## 2021-09-11 DIAGNOSIS — G894 Chronic pain syndrome: Secondary | ICD-10-CM | POA: Diagnosis not present

## 2021-09-11 LAB — BASIC METABOLIC PANEL
Anion gap: 8 (ref 5–15)
BUN: 28 mg/dL — ABNORMAL HIGH (ref 6–20)
CO2: 27 mmol/L (ref 22–32)
Calcium: 10 mg/dL (ref 8.9–10.3)
Chloride: 105 mmol/L (ref 98–111)
Creatinine, Ser: 1.45 mg/dL — ABNORMAL HIGH (ref 0.61–1.24)
GFR, Estimated: >60 mL/min (ref >60)
Glucose, Bld: 325 mg/dL — ABNORMAL HIGH (ref 70–99)
Potassium: 4.4 mmol/L (ref 3.5–5.1)
Sodium: 140 mmol/L (ref 135–145)

## 2021-09-11 LAB — ANA COMPREHENSIVE PANEL

## 2021-09-11 LAB — GLUCOSE, CAPILLARY
Glucose-Capillary: 355 mg/dL — ABNORMAL HIGH (ref 70–99)
Glucose-Capillary: 356 mg/dL — ABNORMAL HIGH (ref 70–99)
Glucose-Capillary: 247 mg/dL — ABNORMAL HIGH (ref 70–99)
Glucose-Capillary: 203 mg/dL — ABNORMAL HIGH (ref 70–99)

## 2021-09-11 MED ORDER — GADOBUTROL 1 MMOL/ML IV SOLN
7.0000 mL | Freq: Once | INTRAVENOUS | Status: AC | PRN
Start: 2021-09-11 — End: 2021-09-11
  Administered 2021-09-11: 7.5 mL via INTRAVENOUS

## 2021-09-11 MED ORDER — INSULIN GLARGINE-YFGN 100 UNIT/ML ~~LOC~~ SOLN: 150.0000 [IU] | Freq: Two times a day (BID) | SUBCUTANEOUS | Status: DC

## 2021-09-11 MED ORDER — OXYCODONE HCL 5 MG PO TABS
10.0000 mg | ORAL_TABLET | ORAL | Status: DC | PRN
  Administered 2021-09-11 – 2021-09-12 (×3): 10 mg via ORAL
  Filled 2021-09-11 (×3): qty 2

## 2021-09-11 MED ORDER — INSULIN GLARGINE-YFGN 100 UNIT/ML ~~LOC~~ SOLN
140.0000 [IU] | Freq: Two times a day (BID) | SUBCUTANEOUS | Status: DC
  Administered 2021-09-11: 90 [IU] via SUBCUTANEOUS
  Administered 2021-09-11 – 2021-09-12 (×2): 140 [IU] via SUBCUTANEOUS
  Filled 2021-09-11 (×4): qty 1.4

## 2021-09-11 MED ORDER — INSULIN ASPART 100 UNIT/ML IJ SOLN
8.0000 [IU] | Freq: Three times a day (TID) | INTRAMUSCULAR | Status: DC
  Administered 2021-09-11 – 2021-09-12 (×5): 8 [IU] via SUBCUTANEOUS
  Filled 2021-09-11 (×5): qty 1

## 2021-09-11 MED ORDER — GABAPENTIN 300 MG PO CAPS
300.0000 mg | ORAL_CAPSULE | Freq: Two times a day (BID) | ORAL | Status: DC
  Administered 2021-09-11 – 2021-09-12 (×3): 300 mg via ORAL
  Filled 2021-09-11 (×3): qty 1

## 2021-09-11 MED ORDER — OXYCODONE HCL 5 MG PO TABS: 5.0000 mg | ORAL_TABLET | ORAL | Status: DC | PRN

## 2021-09-11 NOTE — TOC Initial Note (Signed)
Transition of Care [facility]) - Initial/Assessment Note    Patient Details  Name: Cory Campbell MRN: [id number] Date of Birth: September 23, 1980  Transition of Care [facility]) CM/SW Contact:    Liliana Cline, LCSW Phone Number: 09/11/2021, 10:29 AM  Clinical Narrative:                 CSW spoke with patient for high risk assessment.  Patient lives with his wife (who he states is his "live in aide") and 42 year old son. Patient uses [employer] for transportation. PCP is Dr. Letta Pate. Pharmacy is [facility] [place]. Patient has crutches and a motorized wheelchair at home. Patient had HH in the past, unsure of agency. No SNF history. Patient states his wife will pick him up when he is discharged.   Expected Discharge Plan: Home/Self Care Barriers to Discharge: Continued Medical Work up   Patient Goals and CMS Choice Patient states their goals for this hospitalization and ongoing recovery are:: home with wife CMS Medicare.gov Compare Post Acute Care list provided to:: Patient Choice offered to / list presented to : Patient  Expected Discharge Plan and Services Expected Discharge Plan: Home/Self Care       Living arrangements for the past 2 months: Single Family Home                                      Prior Living Arrangements/Services Living arrangements for the past 2 months: Single Family Home Lives with:: Spouse, Minor Children Patient language and need for interpreter reviewed:: Yes Do you feel safe going back to the place where you live?: Yes      Need for Family Participation in Patient Care: Yes (Comment) Care giver support system in place?: Yes (comment) Current home services: DME Criminal Activity/Legal Involvement Pertinent to Current Situation/Hospitalization: No - Comment as needed  Activities of Daily Living Home Assistive Devices/Equipment: Crutches, Wheelchair, Other (Comment) ADL Screening (condition at time of admission) Patient's cognitive ability adequate to safely  complete daily activities?: Yes Is the patient deaf or have difficulty hearing?: No Does the patient have difficulty seeing, even when wearing glasses/contacts?: No Does the patient have difficulty concentrating, remembering, or making decisions?: No Patient able to express need for assistance with ADLs?: Yes Does the patient have difficulty dressing or bathing?: Yes Independently performs ADLs?: No Communication: Independent Dressing (OT): Needs assistance Is this a change from baseline?: Pre-admission baseline Grooming: Needs assistance Is this a change from baseline?: Pre-admission baseline Feeding: Independent Bathing: Needs assistance Is this a change from baseline?: Pre-admission baseline Toileting: Needs assistance Is this a change from baseline?: Pre-admission baseline In/Out Bed: Needs assistance Is this a change from baseline?: Pre-admission baseline Walks in Home: Independent with device (comment) Does the patient have difficulty walking or climbing stairs?: Yes Weakness of Legs: Both Weakness of Arms/Hands: None  Permission Sought/Granted Permission sought to share information with : Oceanographer granted to share information with : Yes, Verbal Permission Granted     Permission granted to share info w AGENCY: as needed        Emotional Assessment       Orientation: : Oriented to Situation, Oriented to  Time, Oriented to Place, Oriented to Self Alcohol / Substance Use: Not Applicable Psych Involvement: No (comment)  Admission diagnosis:  AKI (acute kidney injury) (HCC) [N17.9] Acute sepsis (HCC) [A41.9] Pain in both feet [M79.671, M79.672] Patient Active Problem List  Diagnosis Date Noted   Pain in both feet 09/10/2021   Bilateral foot pain 09/09/2021   Lactic acidosis 09/09/2021   Tobacco dependence 09/09/2021   Weakness    AKI (acute kidney injury) (HCC) 06/05/2021   Postural dizziness with presyncope 06/05/2021    Arthrogryposis, with contractures and functional paraplegia    Seizure disorder (HCC)    Headache    Acute asthma exacerbation 03/30/2020   Acute respiratory failure with hypoxia (HCC) 03/29/2020   Surgical wound, non healing    Acute colitis 05/20/2017   Colitis    Acute renal failure (HCC) 05/15/2017   Diarrhea 05/15/2017   Sepsis (HCC) 05/15/2017   Abdominal pain 11/25/2016   HIV (human immunodeficiency virus infection) (HCC) 11/19/2016   Hypotension 10/08/2016   Unstable angina (HCC) 10/06/2016   Shortness of breath 06/23/2015   Obesity 05/18/2014   Frequent falls 08/12/2013   Pain of left clavicle 08/12/2013   Tachycardia 01/21/2013   Chronic pain syndrome 08/12/2012   Sleep apnea 06/07/2012   Chest pain 06/20/2011   Hypertension associated with diabetes (HCC) 06/20/2011   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/20/2011   Diabetes mellitus (HCC) 06/20/2011   Asthma, chronic, unspecified asthma severity, with acute exacerbation 01/18/2008   Hyperlipidemia associated with type 2 diabetes mellitus (HCC) 08/28/2006   Essential hypertension 06/27/2005   PCP:  Emogene Morgan, MD Pharmacy:   [facility] [address] - [place], [place] - [address] [place] [address] Phone: [phone] Fax: [phone]  [id number] [place], [place] - [address] AT [facility] [address] [address] [place] [address] Phone: [phone] Fax: [phone]     Social Determinants of Health (SDOH) Interventions    Readmission Risk Interventions    09/11/2021   10:27 AM  Readmission Risk Prevention Plan  Transportation Screening Complete  PCP or Specialist Appt within 3-5 Days Complete  HRI or Home Care Consult Complete  Social Work Consult for Recovery Care Planning/Counseling Complete  Palliative Care Screening Not Applicable  Medication Review Oceanographer) Complete

## 2021-09-11 NOTE — Consult Note (Addendum)
  Subjective:  Patient ID: Cory Campbell, male    DOB: 1980/04/22,  MRN: [id number]  A 41 y.o. male presents witha past medical history of chronic diastolic CHF chronic pain arthrogryposis depression BPH diabetes HIV HTN HLD seizures and hypothyroidism presents with complex history and with bilateral foot pain swelling has been on for few months.  Patient was following up at [facility] orthopedics and has been utilizing careful brace and AFO brace on the left and right respectively.  Patient states he is always had some foot pain however has become more symptomatic with time.  He also has a foot doctor there follows up at [facility] for which they tried a cam boot but has not helped.  He had received prednisone as well as IV antibiotics but have not helped with the foot pain. Objective:   Vitals:   09/11/21 0812 09/11/21 1517  BP: (!) 140/105 (!) 141/86  Pulse: (!) 107 88  Resp: 16 16  Temp: 97.8 F (36.6 C) 98.1 F (36.7 C)  SpO2: 100% 100%   General AA&O x3. Normal mood and affect.  Vascular Dorsalis pedis and posterior tibial pulses 2/4 bilat. Brisk capillary refill to all digits. Pedal hair present.  Neurologic Epicritic sensation grossly intact.  Dermatologic No open wounds or lesions noted.  No breakdown of skin noted.  High arch foot structure noted likely due to underlying arthrogryposis  Orthopedic: MMT 5/5 in dorsiflexion, plantarflexion, inversion, and eversion. Normal joint ROM without pain or crepitus.   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  Lateral midfoot arthropathy.  Some component of Charcot breakdown bilaterally Assessment & Plan:  Patient was evaluated and treated and all questions answered.  Bilateral pain and swelling with underlying history of arthrogryposis and diabetes with neuropathy -All questions and concerns were discussed with the patient in extensive detail -He may benefit from  changing from gabapentin to Lyrica.  I discussed this with him. -He follows up with pain management doctor as well.  I discussed this with him to see if his pain management doctor can help with some of the pain that he is experiencing. -The type of foot structure that he has was dysplastic calcaneus and underlying arthropathy can lead to a lot of chronic pain over time.  Ultimately I believe that this is likely due to swelling to the lower extremity that is leading to some internal pressure on the structure and therefore his pain.  He will benefit from aggressive elevation and compression. -He may also benefit from motorized wheelchair with elevation capabilities.  His current motor wheelchair does not have it. -I am not sure if the MRI of the bilateral feet will be beneficial.  However we will clinically follow it. -No surgical indications at this time. -Patient can follow-up with his [facility] podiatrist.  Candelaria Stagers, DPM  Accessible via secure chat for questions or concerns.

## 2021-09-11 NOTE — Progress Notes (Signed)
[facility] Progress Note  Patient: Cory Campbell     MRN:[id number]  DOA: 09/09/2021   PCP: Aycock, Ngwe A, MD       Brief hospital course: 40-year-old male with arthrogyrposis with inability to ambulate long distances, chronic pain, diastolic heart failure, HIV, diabetes mellitus, OSA, hypothyroidism, BPH, hypertension, depression. The patient presented to the ED with pain in both feet.  He recently finished a course of doxycycline for sinusitis.  He went to see his orthopedic surgeon on 8/2 for the foot pain and x-ray imaging was done which was negative. In the ED: Lactic acid noted to be elevated at 2.6 followed by 3.4 and 2.8 It was suspected that he may have an infection and started on antibiotics.  Subjective:  He continues to have pain in both feet. He states his pain management doctor felt that his Neurontin needed to be taking in AM in addition to PM.   Assessment and Plan: Principal Problem:   Bilateral foot pain with swelling - Feet are swollen and severely tender to light touch - xray images negative - As antibiotics have not helped I discontinued them - Auto immune? -   ESR normal - CRP only 1.4 (upper limit 1.0) - ANA pending - no improvement with steroids- have d'c them - ortho consulted - Dr Crawford unsure of etiology - will consult podiatry today     Active Problems:   AKI (acute kidney injury), dehydration and lactic acidosis - Cr 0.9 - 1.1 at baseline - Cr 1.51 when admitted- Cr improving - Lactic acid not normal at 1.5 - presented with AKI on 06/05/21 as well - on Chlorthalidone and Spironolactone as outpt which are on hold and may need to be discontinued - holding Lisinopril - dc IVF today     Insulin dependent type 2 diabetes mellitus (HCC) - A1c 7.8 - on Semglee and SSI - holding Jardiance, Metformin and Trulicity - CBG 431> 355> 356- Added mealtime novolog and increased Semglee- stopped steroids    Chronic pain syndrome - cont  Buprenorphine, Tizanidine and Gabapentin - will increase Gabapentin to 300 mg BID from QHS   HTN - cont Amlodipine, Metoprolol, Catapres and Imdur    HIV (human immunodeficiency virus infection) (HCC) - last CD 4 count in 3/23 was 741 - cont Bictarvy  - cont Acyclovir    Arthrogryposis, with contractures and functional paraparesis  - atrophic legs- - he is able to ambulate around his home with his leg braces and crutches but uses an electric wheelchair for longer distances    Tobacco dependence   DVT prophylaxis:  enoxaparin (LOVENOX) injection 40 mg Start: 09/09/21 2300     Code Status: Full Code  Consultants: ortho Level of Care: Level of care: Med-Surg Disposition Plan:  Status is: Observation The patient will require care spanning > 2 midnights and should be moved to inpatient because: swelling of feet with pain- work up underway  Objective:   Vitals:   09/10/21 2109 09/11/21 0405 09/11/21 0715 09/11/21 0812  BP:  (!) 121/91  (!) 140/105  Pulse:  99  (!) 107  Resp:  16  16  Temp:  97.7 F (36.5 C)  97.8 F (36.6 C)  TempSrc:  Oral  Oral  SpO2: 97% 99% 97% 100%  Weight:      Height:       Filed Weights   09/09/21 1852 09/09/21 2304  Weight: 78 kg 70 kg   Exam: General exam: Appears comfortable  HEENT:   PERRLA, oral mucosa moist, no sclera icterus or thrush Respiratory system: Clear to auscultation. Respiratory effort normal. Cardiovascular system: S1 & S2 heard, regular rate and rhythm Gastrointestinal system: Abdomen soft, non-tender, nondistended. Normal bowel sounds   Central nervous system: Alert and oriented. No focal neurological deficits. Extremities: No cyanosis, clubbing - atrophic legs- diffuse swelling of feet and ankles L > R  Skin: No rashes or ulcers Psychiatry:  Mood & affect appropriate.    Imaging and lab data was personally reviewed    CBC: Recent Labs  Lab 09/09/21 2000 09/10/21 0453  WBC 7.3 5.9  NEUTROABS 4.5  --   HGB 16.8 16.2   HCT 52.7* 49.6  MCV 89.5 88.7  PLT 285 101    Basic Metabolic Panel: Recent Labs  Lab 09/09/21 2000 09/10/21 0453 09/11/21 0538  NA 142 141 140  K 3.7 3.7 4.4  CL 102 103 105  CO2 _0 GLUCOSE 149* 156* 325*  BUN 33* 25* 28*  CREATININE 1.51* 1.27* 1.45*  CALCIUM 9.7 9.2 10.0    GFR: Estimated Creatinine Clearance: 62.7 mL/min (A) (by C-G formula based on SCr of 1.45 mg/dL (H)).  Scheduled Meds:  acyclovir  400 mg Oral BID   amLODipine  10 mg Oral Daily   aspirin EC  81 mg Oral Daily   bictegravir-emtricitabine-tenofovir AF  1 tablet Oral Daily   budesonide (PULMICORT) nebulizer solution  0.25 mg Nebulization BID   buprenorphine  1 patch Transdermal Weekly   citalopram  20 mg Oral Daily   cloNIDine  0.3 mg Transdermal Weekly   dicyclomine  20 mg Oral BID AC   docusate sodium  100 mg Oral BID   enoxaparin (LOVENOX) injection  40 mg Subcutaneous Q24H   famotidine  20 mg Oral Daily   feeding supplement (GLUCERNA SHAKE)  237 mL Oral QID   fesoterodine  4 mg Oral Daily   finasteride  5 mg Oral Daily   fluticasone  2 spray Each Nare Daily   gabapentin  300 mg Oral BID   ibuprofen  600 mg Oral TID   insulin aspart  0-15 Units Subcutaneous TID WC   insulin aspart  0-5 Units Subcutaneous QHS   insulin aspart  8 Units Subcutaneous TID WC   insulin glargine-yfgn  140 Units Subcutaneous BID   ipratropium  2 spray Each Nare QID   isosorbide mononitrate  30 mg Oral Daily   loratadine  10 mg Oral Daily   metoprolol tartrate  150 mg Oral BID   mirtazapine  45 mg Oral QHS   multivitamin with minerals  1 tablet Oral Daily   nicotine  14 mg Transdermal Daily   nortriptyline  50 mg Oral QHS   polyethylene glycol  17 g Oral TID   prednisoLONE acetate  1 drop Right Eye Daily   rosuvastatin  40 mg Oral Daily   sodium chloride flush  3 mL Intravenous Q12H   tiZANidine  4 mg Oral TID   Continuous Infusions:     LOS: 1 day   Author: Debbe Odea  09/11/2021 12:10 PM

## 2021-09-11 NOTE — Consult Note (Signed)
ORTHOPAEDIC CONSULTATION  REQUESTING PHYSICIAN: Calvert Cantor, MD  Chief Complaint: bilateral foot pain/swelling  HPI: Cory Campbell is a 41 y.o. male with a complex PMH admitted for bilateral foot pain/swelling. PMH notable for chronic diastolic CHF;  chronic pain with arthrogryposis; depression; BPH; DM; HIV; HTN; HLD; seizures; OSA; and hyperthyroidism.  He has been followed at [facility] orthopedics regarding his foot pain, most recently 8/2. He utilizes a KAFO on the left and AFO on the right. He has had worsening pain the last few months and feels he is unable to wear his braces. At his visit on 8/2, it was recommended he wear a CAM boot on the left foot, as it is more symptomatic.  Orthopedics was consulted during this admission for evaluation of potential origin of his foot pain. He was started on IV antibiotics on 8/4 but stopped 8/5 due to lack of improvement.  Past Medical History:  Diagnosis Date   (HFpEF) heart failure with preserved ejection fraction (HCC)    Acid reflux    Adopted    Anxiety    Arthritis    Arthrogryposis    Asthma    BPH (benign prostatic hyperplasia)    Chronic pain syndrome    Depressive disorder, not elsewhere classified    Diabetes mellitus without complication (HCC)    Diverticulitis of colon (without mention of hemorrhage)(562.11)    ED (erectile dysfunction)    Herpes genitalis    HIV infection (HCC)    HTN (hypertension)    Hyperlipidemia    Hypertensive cardiomyopathy (HCC)    Hypogonadism in male    OAB (overactive bladder)    Other psoriasis    Seizure disorder (HCC)    Sleep apnea    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 or other cause, without mention of thyrotoxic crisis or storm    hyperthyroidism   Past Surgical History:  Procedure Laterality Date   APPENDECTOMY     COLOSTOMY     CORNEAL TRANSPLANT     feet surgery     both   HAND SURGERY     left and right   leg surgery     left and right   ORTHOPEDIC SURGERY      hands, feet, knees, legs   tubes in ears     both   Social History   Socioeconomic History   Marital status: Married    Spouse name: Not on file   Number of children: Not on file   Years of education: Not on file   Highest education level: Not on file  Occupational History   Occupation: Conservation officer, nature  Tobacco Use   Smoking status: Every Day    Types: Pipe, Cigars, E-cigarettes    Last attempt to quit: 01/2018    Years since quitting: 3.6   Smokeless tobacco: Never   Tobacco comments:    Declines patch  Vaping Use   Vaping Use: Some days   Substances: Nicotine, Flavoring  Substance and Sexual Activity   Alcohol use: Yes    Comment: occasional   Drug use: No   Sexual activity: Not on file  Other Topics Concern   Not on file  Social History Narrative   Uses crutches to ambulate   Social Determinants of Health   Financial Resource Strain: Not on file  Food Insecurity: Not on file  Transportation Needs: Not on file  Physical Activity: Not on file  Stress: Not on file  Social Connections: Not on file   Family History  Adopted: Yes  Family history unknown: Yes   Allergies  Allergen Reactions   Penicillins Hives, Itching and Rash    Has patient had a PCN reaction causing immediate rash, facial/tongue/throat swelling, SOB or lightheadedness with hypotension: Yes Has patient had a PCN reaction causing severe rash involving mucus membranes or skin necrosis: No Has patient had a PCN reaction that required hospitalization No Has patient had a PCN reaction occurring within the last 10 years: No If all of the above answers are "NO", then may proceed with Cephalosporin use.    Erythromycin Base Itching and Rash   Prior to Admission medications   Medication Sig Start Date End Date Taking? Authorizing Provider  acyclovir (ZOVIRAX) 400 MG tablet Take 400 mg by mouth 2 (two) times daily.   Yes [provider]  albuterol (PROVENTIL HFA;VENTOLIN HFA) 108 (90 BASE) MCG/ACT  inhaler Inhale 2 puffs into the lungs every 4 (four) hours as needed for wheezing or shortness of breath.    Yes [provider]  albuterol (PROVENTIL) (2.5 MG/3ML) 0.083% nebulizer solution Take 2.5 mg by nebulization every 4 (four) hours as needed for wheezing or shortness of breath.   Yes [provider]  amLODipine (NORVASC) 10 MG tablet Take 10 mg by mouth daily.   Yes [provider]  aspirin EC 81 MG tablet Take 81 mg by mouth daily. Swallow whole.   Yes [provider]  BIKTARVY 50-200-25 MG TABS tablet Take 1 tablet by mouth daily. 11/21/16  Yes [provider]  buprenorphine (BUTRANS) 7.5 MCG/HR Place 7.5 mg onto the skin once a week. Saturday   Yes [provider]  buPROPion (WELLBUTRIN XL) 150 MG 24 hr tablet Take 150 mg by mouth daily. 08/11/21  Yes [provider]  cetirizine (ZYRTEC) 10 MG tablet Take 10 mg by mouth daily.   Yes [provider]  chlorthalidone (HYGROTON) 25 MG tablet Take 25 mg by mouth daily.   Yes [provider]  citalopram (CELEXA) 20 MG tablet Take 20 mg by mouth daily.   Yes [provider]  dicyclomine (BENTYL) 10 MG capsule Take 20 mg by mouth 2 (two) times daily before a meal. 05/01/21  Yes [provider]  empagliflozin (JARDIANCE) 25 MG TABS tablet Take 25 mg by mouth daily. 01/19/20  Yes [provider]  famotidine (PEPCID) 20 MG tablet Take 20 mg by mouth daily.  01/03/16  Yes [provider]  finasteride (PROSCAR) 5 MG tablet Take 5 mg by mouth daily.   Yes [provider]  fluticasone (FLONASE) 50 MCG/ACT nasal spray Place 2 sprays into both nostrils daily.   Yes [provider]  fluticasone (FLOVENT HFA) 110 MCG/ACT inhaler Inhale 2 puffs into the lungs 2 (two) times daily.   Yes [provider]  gabapentin (NEURONTIN) 300 MG capsule Take 300 mg by mouth at bedtime.   Yes [provider]  insulin degludec  (TRESIBA) 100 UNIT/ML FlexTouch Pen Inject 100 Units into the skin 2 (two) times daily.   Yes [provider]  ipratropium (ATROVENT) 0.06 % nasal spray Place 2 sprays into both nostrils 4 (four) times daily. 08/29/21  Yes Becky Augusta, NP  isosorbide mononitrate (IMDUR) 30 MG 24 hr tablet Take 30 mg by mouth daily. 07/01/21  Yes [provider]  ketoconazole (NIZORAL) 2 % shampoo Apply 1 application topically 2 (two) times a week. tues and thursday 08/27/14  Yes [provider]  lidocaine (LIDODERM) 5 % Place onto  the skin. 07/13/21  Yes [provider]  lisinopril (ZESTRIL) 40 MG tablet Take 40 mg by mouth daily.   Yes [provider]  metFORMIN (GLUCOPHAGE-XR) 500 MG 24 hr tablet Take 1,000 mg by mouth 2 (two) times daily.   Yes [provider]  metoprolol tartrate (LOPRESSOR) 100 MG tablet Take 150 mg by mouth 2 (two) times daily. 07/05/21  Yes [provider]  mirtazapine (REMERON) 45 MG tablet Take 45 mg by mouth at bedtime.   Yes [provider]  Multiple Vitamin (MULTIVITAMIN) tablet Take 1 tablet by mouth daily.   Yes [provider]  naproxen (NAPROSYN) 500 MG tablet Take 500 mg by mouth 2 (two) times daily. 09/08/21  Yes [provider]  nortriptyline (PAMELOR) 25 MG capsule Take 50 mg by mouth at bedtime.   Yes [provider]  omega-3 acid ethyl esters (LOVAZA) 1 g capsule Take 1 g by mouth 3 (three) times daily.   Yes [provider]  omeprazole (PRILOSEC) 20 MG capsule Take 20 mg by mouth daily. 09/08/21  Yes [provider]  ondansetron (ZOFRAN-ODT) 4 MG disintegrating tablet Take 4 mg by mouth every 8 (eight) hours as needed for nausea or vomiting.   Yes [provider]  polyethylene glycol (MIRALAX / GLYCOLAX) 17 g packet Take 17 g by mouth 3 (three) times daily.   Yes [provider]  prednisoLONE acetate (PRED FORTE) 1 % ophthalmic suspension Place 1 drop  into the right eye daily.    Yes [provider]  promethazine-dextromethorphan (PROMETHAZINE-DM) 6.25-15 MG/5ML syrup Take 5 mLs by mouth 4 (four) times daily as needed. 08/29/21  Yes Becky Augusta, NP  rosuvastatin (CRESTOR) 40 MG tablet TAKE 1 TABLET(40 MG) BY MOUTH DAILY 04/30/19  Yes Gollan, Tollie Pizza, MD  solifenacin (VESICARE) 10 MG tablet Take 10 mg by mouth daily.   Yes [provider]  Spacer/Aero-Holding Chambers (AEROCHAMBER PLUS) inhaler Use as instructed 03/23/18  Yes Domenick Gong, MD  spironolactone (ALDACTONE) 50 MG tablet Take 100 mg by mouth daily. 06/15/21  Yes [provider]  tiZANidine (ZANAFLEX) 4 MG tablet Take 1 tablet (4 mg total) by mouth 3 (three) times daily. 07/06/21 07/06/22 Yes Varney Daily, PA  benzonatate (TESSALON) 100 MG capsule Take 2 capsules (200 mg total) by mouth every 8 (eight) hours. 08/29/21   Becky Augusta, NP  cloNIDine (CATAPRES - DOSED IN MG/24 HR) 0.3 mg/24hr patch 0.3 mg once a week. 06/13/21   [provider]  diclofenac sodium (VOLTAREN) 1 % GEL Apply 2-4 g topically 4 (four) times daily as needed. For pain.    [provider]  feeding supplement, GLUCERNA SHAKE, (GLUCERNA SHAKE) LIQD Take 237 mLs by mouth 4 (four) times daily.    [provider]  glucose 4 GM chewable tablet Chew 1 tablet by mouth as needed for low blood sugar.    [provider]  Melatonin 5 MG TABS Take 10 mg by mouth at bedtime as needed (sleep).    [provider]  nitroGLYCERIN (NITROSTAT) 0.4 MG SL tablet Place 1 tablet (0.4 mg total) under the tongue every 5 (five) minutes as needed for chest pain. Call 911 if you have taken 3 Nitro for chest pain 03/14/21   Antonieta Iba, MD  TRULICITY 1.5 MG/0.5ML SOPN Inject 1.5 mg into the skin once a week. 01/14/20   [provider]   DG Foot Complete Right  Result Date: 09/09/2021 CLINICAL DATA:  Foot  pain and swelling EXAM: RIGHT FOOT COMPLETE - 3+  VIEW; LEFT FOOT - COMPLETE 3+ VIEW COMPARISON:  None Available. FINDINGS: There is no evidence of fracture or dislocation. Chronic, non weight-bearing deformities of the bilateral mid and hind feet. There is no evidence of arthropathy. Soft tissues are unremarkable. IMPRESSION: No fracture or dislocation of the bilateral feet. Chronic, non weight-bearing deformities. Soft tissues unremarkable. Electronically Signed   By: Jearld Lesch M.D.   On: 09/09/2021 19:36   DG Foot Complete Left  Result Date: 09/09/2021 CLINICAL DATA:  Foot pain and swelling EXAM: RIGHT FOOT COMPLETE - 3+ VIEW; LEFT FOOT - COMPLETE 3+ VIEW COMPARISON:  None Available. FINDINGS: There is no evidence of fracture or dislocation. Chronic, non weight-bearing deformities of the bilateral mid and hind feet. There is no evidence of arthropathy. Soft tissues are unremarkable. IMPRESSION: No fracture or dislocation of the bilateral feet. Chronic, non weight-bearing deformities. Soft tissues unremarkable. Electronically Signed   By: Jearld Lesch M.D.   On: 09/09/2021 19:36   DG Chest Port 1 View  Result Date: 09/09/2021 CLINICAL DATA:  Questionable sepsis EXAM: PORTABLE CHEST 1 VIEW COMPARISON:  06/05/2021 FINDINGS: The heart size and mediastinal contours are within normal limits. Both lungs are clear. The visualized skeletal structures are unremarkable. IMPRESSION: No acute abnormality of the lungs in AP portable projection. Electronically Signed   By: Jearld Lesch M.D.   On: 09/09/2021 19:33    Positive ROS: All other systems have been reviewed and were otherwise negative with the exception of those mentioned in the HPI and as above.  Physical Exam: General: Alert, no acute distress Cardiovascular: No pedal edema Respiratory: No cyanosis, no use of accessory musculature GI: No organomegaly, abdomen is soft and non-tender Skin: No lesions in the area of chief complaint Neurologic: Sensation intact distally Psychiatric: Patient is  competent for consent with normal mood and affect Lymphatic: No axillary or cervical lymphadenopathy  Bilateral feet:  See picture from H&P on 8/4 Skin intact Mild dorsal swelling of bilateral feet Limited active motion in bilateral feet Endorses sensation in s/s/sp/dp/t nerve distribution  Assessment: 41yo M with complex PMH including arthrogryposis, DM with neuropathy admitted with chronic bilateral foot pain/swelling  Plan: I reviewed the patient's records from his orthopedic visits at [facility] and discussed his ambulatory status (limited ambulation, primarily stands for transfers KAFO on Left and AFO on Right) as well as prior treatment. 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 We discussed that his chronic swelling/pain could be related to Charcot arthropathy as well as possible infection. MRI of bilateral feet could be beneficial as well as a podiatry consult. I discussed these recommendations with the hospitalist team.     Ross Marcus, MD    09/11/2021 3:30 PM

## 2021-09-11 NOTE — Evaluation (Signed)
Physical Therapy Evaluation Patient Details Name: Cory Campbell MRN: [id number] DOB: [birth year] Today's Date: 09/11/2021  History of Present Illness  Pt is a 41 y/o M who presented to the ED for evaluation of BLE feet pain.   PMH: chronic pain, diastolic heart failure, HIV, DM, OSA, hypothyroidism, BPH, HTN, depression  Clinical Impression  Pt seen for PT evaluation with pt reporting prior to admission he was mod I with transfers with or without braces & AD, ambulated short distance with BLE braces & loftstrand crutches, primarily used power w/c for mobility, & worked part time at [employer]. On this date, pt endorses significant BLE pain at rest that increases with touch & pt unable to tolerate BLE in dependent position. Pt demonstrates good awareness of situation & abilities & instructs PT on how he completes transfer at home. On this date, pt completes lateral scoot to recliner with PT maintaining BLE elevation 2/2 pain but otherwise pt able to complete transfer on his own with good BUE strength. Pt declines need for f/u therapy at home but will see pt in acute setting to maintain strength & CLOF; asked pt to have wife bring in BLE braces & crutches to allow pt to attempt ambulation when BLE pain improves.      Recommendations for follow up therapy are one component of a multi-disciplinary discharge planning process, led by the attending physician.  Recommendations may be updated based on patient status, additional functional criteria and insurance authorization.  Follow Up Recommendations No PT follow up      Assistance Recommended at Discharge Intermittent Supervision/Assistance  Patient can return home with the following  A little help with walking and/or transfers    Equipment Recommendations None recommended by PT  Recommendations for Other Services       Functional Status Assessment Patient has had a recent decline in their functional status and demonstrates the ability to make  significant improvements in function in a reasonable and predictable amount of time.     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 Pt is able to complete lateral scoot bed>recliner with good BUE strength & ability to scoot.)      Ambulation/Gait                  Stairs            Wheelchair Mobility    Modified Rankin (Stroke Patients Only)       Balance Overall balance assessment: Needs assistance Sitting-balance support: Feet supported, Bilateral upper extremity supported Sitting balance-Leahy Scale: Good                                       Pertinent Vitals/Pain Pain Assessment Pain Assessment: Faces Pain Score: 8  Pain Location: BLE 8/10  at rest, increases to 10/10 with palpation Pain Descriptors / Indicators: Discomfort Pain Intervention(s): Monitored during session, Repositioned    Home Living Family/patient expects to be discharged to:: Private residence Living Arrangements: Spouse/significant other Available Help at Discharge: Family Type of Home: Apartment Home Access: Level entry       Home Layout: One level Home Equipment: Wheelchair - power Additional Comments: BLE braces, loftstrand crutches    Prior Function Prior Level of Function : Independent/Modified Independent;Working/employed             Mobility Comments:  Pt reports he's mod I with transfers with or without AD, walks short distances in the home with BLE braces & loftstrand crutches, works part time at [employer] (from w/c level), primarily uses power w/c for mobility, uses [employer] transportation       [employer]        Extremity/Trunk Assessment   Upper Extremity Assessment Upper Extremity Assessment: Overall WFL for tasks assessed    Lower Extremity Assessment Lower Extremity Assessment:  (Pt with significant atrophy in BLE (reports birth defects); pt doesn't tolerate BLE in dependent position 2/2 pain & requires assistance to keep them elevated during transfer.)       Communication      Cognition Arousal/Alertness: Awake/alert Behavior During Therapy: WFL for tasks assessed/performed Overall Cognitive Status: Within Functional Limits for tasks assessed                                 General Comments: AxOx4, good awareness of situation, able to instruct PT on how he performs transfers at home        General Comments      Exercises     Assessment/Plan    PT Assessment Patient needs continued PT services  PT Problem List Decreased mobility;Pain       PT Treatment Interventions DME instruction;Therapeutic exercise;Gait training;Balance training;Functional mobility training;Therapeutic activities;Patient/family education;Neuromuscular re-education    PT Goals (Current goals can be found in the Care Plan section)  Acute Rehab PT Goals Patient Stated Goal: decreased pain & figure out what's causing pain PT Goal Formulation: With patient Time For Goal Achievement: 09/25/21 Potential to Achieve Goals: Good    Frequency Min 2X/week     Co-evaluation               AM-PAC PT "6 Clicks" Mobility  Outcome Measure Help needed turning from your back to your side while in a flat bed without using bedrails?: None Help needed moving from lying on your back to sitting on the side of a flat bed without  using bedrails?: None Help needed moving to and from a bed to a chair (including a wheelchair)?: None Help needed standing up from a chair using your arms (e.g., wheelchair or bedside chair)?: A Little Help needed to walk in hospital room?: A Little Help needed climbing 3-5 steps with a railing? : Total 6 Click Score: 19    End of Session   Activity Tolerance: Patient tolerated treatment well Patient left: in chair;with chair alarm set;with call bell/phone within reach Nurse Communication: Mobility status PT Visit Diagnosis: Muscle weakness (generalized) (M62.81);Pain Pain - Right/Left:  (bilateral) Pain - part of body: Ankle and joints of foot    Time: 4098-1191 PT Time Calculation (min) (ACUTE ONLY): 21 min   Charges:   PT Evaluation $PT Eval Moderate Complexity: 1 Mod  Lavone Nian, PT, DPT 09/11/21, 10:30 AM   Waunita Schooner 09/11/2021, 10:28 AM

## 2021-09-12 DIAGNOSIS — I152 Hypertension secondary to endocrine disorders: Secondary | ICD-10-CM

## 2021-09-12 DIAGNOSIS — E1169 Type 2 diabetes mellitus with other specified complication: Secondary | ICD-10-CM

## 2021-09-12 DIAGNOSIS — E785 Hyperlipidemia, unspecified: Secondary | ICD-10-CM

## 2021-09-12 DIAGNOSIS — R600 Localized edema: Secondary | ICD-10-CM

## 2021-09-12 DIAGNOSIS — N179 Acute kidney failure, unspecified: Secondary | ICD-10-CM | POA: Diagnosis not present

## 2021-09-12 DIAGNOSIS — Q688 Other specified congenital musculoskeletal deformities: Secondary | ICD-10-CM | POA: Diagnosis not present

## 2021-09-12 DIAGNOSIS — G894 Chronic pain syndrome: Secondary | ICD-10-CM | POA: Diagnosis not present

## 2021-09-12 DIAGNOSIS — F17200 Nicotine dependence, unspecified, uncomplicated: Secondary | ICD-10-CM

## 2021-09-12 DIAGNOSIS — M79671 Pain in right foot: Secondary | ICD-10-CM | POA: Diagnosis not present

## 2021-09-12 DIAGNOSIS — E1159 Type 2 diabetes mellitus with other circulatory complications: Secondary | ICD-10-CM

## 2021-09-12 LAB — URINE CULTURE: Culture: 10000 — AB

## 2021-09-12 LAB — GLUCOSE, CAPILLARY
Glucose-Capillary: 174 mg/dL — ABNORMAL HIGH (ref 70–99)
Glucose-Capillary: 198 mg/dL — ABNORMAL HIGH (ref 70–99)

## 2021-09-12 MED ORDER — T.E.D. BELOW KNEE/MEDIUM MISC: 1.0000 | Freq: Every day | 0 refills | Status: AC

## 2021-09-12 MED ORDER — NICOTINE 7 MG/24HR TD PT24: 7.0000 mg | MEDICATED_PATCH | Freq: Every day | TRANSDERMAL | 0 refills | Status: AC

## 2021-09-12 MED ORDER — GABAPENTIN 300 MG PO CAPS: 300.0000 mg | ORAL_CAPSULE | Freq: Two times a day (BID) | ORAL | 0 refills | Status: AC

## 2021-09-12 NOTE — Discharge Instructions (Addendum)
Elevated feet on two pillows when sitting or laying.  Wear TEDs daily and do not sit in wheelchair without them unless you plan to elevate your feet. Please review all of you discharge paperwork on the day of discharge and be sure you have all of your prescribed medications.  Please request your Primary MD to go over all Hospital Tests and Procedure/Radiological results at the follow up Please get all Hospital records sent to your primary MD by signing hospital release before you go home.   In some cases, there will be blood work, cultures and biopsy results pending at the time of your discharge. Please request that your primary care M.D. goes through all the records of your hospital data and follows up on these results.  Please take all your medications with you for your next visit with your Primary MD   Please request your Primary MD to go over all hospital tests and procedure/radiological results at the follow up, please ask your Primary MD to get all Hospital records sent to his/her office.   You must read complete instructions/literature along with all the possible adverse reactions/side effects for all the Medicines you take and that have been prescribed to you. Take any new Medicines after you have completely understood and accpet all the possible adverse reactions/side effects.    Do not drive or operate heavy machinery when taking Pain medications.    Do not take more than prescribed Pain, Sleep and Anxiety Medications  If you have smoked or chewed Tobacco  in the last 2 yrs please stop smoking, stop any regular Alcohol  and or any Recreational drug use.   Wear Seat belts while driving.   If you had Pneumonia or Lung problems at the Hospital: Please get a 2 view Chest X ray done in 6-8 weeks after hospital discharge or sooner if instructed by your Primary MD.   If you have Congestive Heart Failure: Please call your Cardiologist or Primary MD anytime you have any of the following  symptoms:  1) 3 pound weight gain in 24 hours or 5 pounds in 1 week  2) shortness of breath, with or without a dry hacking cough  3) swelling in the hands, feet or stomach  4) if you have to sleep on extra pillows at night in order to breathe 5) Follow cardiac low salt diet and 1.5 lit/day fluid restriction.   If you have Diabetes Accuchecks 4 times/day- once on AM empty stomach and then before each meal. Log in all results and show them to your primary doctor at your next visit. If any glucose reading is under 60 or above 400 call your primary MD immediately.   If you have Seizure/Convulsions/Epilepsy: Please do not drive, operate heavy machinery, participate in activities at heights or participate in high speed sports until you have seen by Primary MD or a Neurologist and advised to do so again. Per [facility] statutes, patients with seizures are not allowed to drive until they have been seizure-free for six months.  Use caution when using heavy equipment or power tools. Avoid working on ladders or at heights. Take showers instead of baths. Ensure the water temperature is not too high on the home water heater. Do not go swimming alone. Do not lock yourself in a room alone (i.e. bathroom). When caring for infants or small children, sit down when holding, feeding, or changing them to minimize risk of injury to the child in the event you have a seizure.  Maintain good sleep hygiene. Avoid alcohol.    If you had Gastrointestinal Bleeding: Please ask your Primary MD to check a complete blood count within one week of discharge or at your next visit. Your endoscopic/colonoscopic biopsies that are pending at the time of discharge, will also need to followed by your Primary MD.  Please note You were cared for by a hospitalist during your hospital stay. If you have any questions about your discharge medications or the care you received while you were in the hospital after you are discharged, you  can call the unit and asked to speak with the hospitalist on call if the hospitalist that took care of you is not available. Once you are discharged, your primary care physician will handle any further medical issues. Please note that NO REFILLS for any discharge medications will be authorized once you are discharged, as it is imperative that you return to your primary care physician (or establish a relationship with a primary care physician if you do not have one) for your aftercare needs so that they can reassess your need for medications and monitor your lab values.   You can reach the hospitalist office at phone [phone] or fax [phone]   If you do not have a primary care physician, you can call [phone] for a physician referral.

## 2021-09-12 NOTE — Discharge Summary (Signed)
Physician Discharge Summary  Cory Campbell Sindoni OIT:[id number] DOB: 09/07/1980 DOA: 09/09/2021  PCP: Donnie Coffin, MD  Admit date: 09/09/2021 Discharge date: 09/12/2021 Discharging to: home Recommendations for Outpatient Follow-up:  New script for Gabapendting BID given- unable to write for other narcotics as he goes to pain management and likely has a pain contract F/u on BP control F/u on renal function and lactic acid- Chlorthalidone on hold for now Encourage smoking cessation (vaping)  Consults:  Ortho Podiatry  Procedures:  MRI bilateral feet- see below   Discharge Diagnoses:   Principal Problem:   Bilateral foot pain Active Problems:   Pedal edema   AKI (acute kidney injury) ([place])   Lactic acidosis   Hypertension associated with diabetes ([place])   Insulin dependent type 2 diabetes mellitus (HCC)   Chronic pain syndrome   Hyperlipidemia associated with type 2 diabetes mellitus (HCC)   HIV (human immunodeficiency virus infection) ([place])   Arthrogryposis, with contractures and functional paraplegia   Tobacco dependence     Hospital Course:  41 year old male with arthrogyrposis with inability to ambulate long distances, chronic pain, diastolic heart failure, HIV, diabetes mellitus, OSA, hypothyroidism, BPH, hypertension, depression. The patient presented to the ED with pain in both feet.  He went to see his orthopedic surgeon on 8/2 for the foot pain and x-ray imaging was done which was negative. In the ED: Lactic acid noted to be elevated at 2.6 followed by 3.4 and 2.8 It was suspected that he may have an infection and started on antibiotics. He recently finished a course of doxycycline for sinusitis.   Interestingly, the swelling was only in his feet and ankles and did not progress up to his legs.  Principal Problem:   Bilateral foot pain with swelling - Feet are swollen and severely tender to light touch - xray images negative - As antibiotics have not helped I  discontinued them -   ESR normal - CRP only 1.4 (upper limit 1.0) - ANA comprehensive panel negative - no improvement with steroids- have d'c them -I ordered TED hose and repeatedly told the patient to keep the feet elevated - ortho consulted - Dr Sharlet Salina unsure of etiology -Podiatry consulted-recommended that patient follow-up with pain management -MRI of bilateral feet unrevealing - The patient does sit in the wheelchair most of the day and also works at [employer]  -After all of this extensive workup, I feel that it is dependent edema causing his swelling.  It has improved today. - I have told him again to wear his TED stockings and keep feet elevated as much as possible-I have written him a note to stay out of work for a week she states that his gabapentin was meant to be increased to twice a day by his pain management physician but he never received a new prescription-I have increased it while he has been in the hospital and I am giving him a new prescription for this   Active Problems:   AKI (acute kidney injury), dehydration and lactic acidosis HTN - cont Amlodipine, Metoprolol, Catapres and Imdur - Cr 0.9 - 1.1 at baseline - Cr 1.51 when admitted- Cr improved to 1.45 yesterday -Lactic acid improved to normal on 8/5 - presented with AKI on 06/05/21 as well -He has received IV fluid hydration with improvement - Will hold chlorthalidone but have resumed Aldactone and lisinopril - Recommend renal function be checked in 1 week as outpatient     Insulin dependent type 2 diabetes mellitus ([place]), uncontrolled  with hyperglycemia - A1c 7.8 - on Semglee and SSI - holding Jardiance, Metformin and Trulicity - CBG 977> 414> 356- Added mealtime novolog and increased Semglee- stopped steroids - Can resume home doses of meds after discharge     Chronic pain syndrome - cont Buprenorphine, Tizanidine and Gabapentin (increased to twice daily as recommended by his pain management doctor  previously)     HIV (human immunodeficiency virus infection) ([place]) - last CD 4 count in 3/23 was 741 - cont Bictarvy  - cont Acyclovir     Arthrogryposis, with contractures and functional paraparesis  - atrophic legs- - he is able to ambulate around his home with his leg braces and crutches but uses an electric wheelchair for longer distances     Tobacco dependence -Advised to stop smoking/vaping - Nicotine patch prescribed         Discharge Instructions  Discharge Instructions     Diet - low sodium heart healthy   Complete by: As directed    Increase activity slowly   Complete by: As directed       Allergies as of 09/12/2021       Reactions   Penicillins Hives, Itching, Rash   Has patient had a PCN reaction causing immediate rash, facial/tongue/throat swelling, SOB or lightheadedness with hypotension: Yes Has patient had a PCN reaction causing severe rash involving mucus membranes or skin necrosis: No Has patient had a PCN reaction that required hospitalization No Has patient had a PCN reaction occurring within the last 10 years: No If all of the above answers are "NO", then may proceed with Cephalosporin use.   Erythromycin Base Itching, Rash        Medication List     STOP taking these medications    chlorthalidone 25 MG tablet Commonly known as: HYGROTON       TAKE these medications    acyclovir 400 MG tablet Commonly known as: ZOVIRAX Take 400 mg by mouth 2 (two) times daily.   AeroChamber Plus inhaler Use as instructed   albuterol (2.5 MG/3ML) 0.083% nebulizer solution Commonly known as: PROVENTIL Take 2.5 mg by nebulization every 4 (four) hours as needed for wheezing or shortness of breath.   albuterol 108 (90 Base) MCG/ACT inhaler Commonly known as: VENTOLIN HFA Inhale 2 puffs into the lungs every 4 (four) hours as needed for wheezing or shortness of breath.   amLODipine 10 MG tablet Commonly known as: NORVASC Take 10 mg by mouth  daily.   aspirin EC 81 MG tablet Take 81 mg by mouth daily. Swallow whole.   benzonatate 100 MG capsule Commonly known as: TESSALON Take 2 capsules (200 mg total) by mouth every 8 (eight) hours.   Biktarvy 50-200-25 MG Tabs tablet Generic drug: bictegravir-emtricitabine-tenofovir AF Take 1 tablet by mouth daily.   buprenorphine 7.5 MCG/HR Commonly known as: BUTRANS Place 7.5 mg onto the skin once a week. Saturday   buPROPion 150 MG 24 hr tablet Commonly known as: WELLBUTRIN XL Take 150 mg by mouth daily.   cetirizine 10 MG tablet Commonly known as: ZYRTEC Take 10 mg by mouth daily.   citalopram 20 MG tablet Commonly known as: CELEXA Take 20 mg by mouth daily.   cloNIDine 0.3 mg/24hr patch Commonly known as: CATAPRES - Dosed in mg/24 hr 0.3 mg once a week.   diclofenac sodium 1 % Gel Commonly known as: VOLTAREN Apply 2-4 g topically 4 (four) times daily as needed. For pain.   dicyclomine 10 MG capsule Commonly known  as: BENTYL Take 20 mg by mouth 2 (two) times daily before a meal.   empagliflozin 25 MG Tabs tablet Commonly known as: JARDIANCE Take 25 mg by mouth daily.   famotidine 20 MG tablet Commonly known as: PEPCID Take 20 mg by mouth daily.   feeding supplement (GLUCERNA SHAKE) Liqd Take 237 mLs by mouth 4 (four) times daily.   finasteride 5 MG tablet Commonly known as: PROSCAR Take 5 mg by mouth daily.   fluticasone 110 MCG/ACT inhaler Commonly known as: FLOVENT HFA Inhale 2 puffs into the lungs 2 (two) times daily.   fluticasone 50 MCG/ACT nasal spray Commonly known as: FLONASE Place 2 sprays into both nostrils daily.   gabapentin 300 MG capsule Commonly known as: NEURONTIN Take 1 capsule (300 mg total) by mouth 2 (two) times daily. What changed: when to take this   glucose 4 GM chewable tablet Chew 1 tablet by mouth as needed for low blood sugar.   insulin degludec 100 UNIT/ML FlexTouch Pen Commonly known as: TRESIBA Inject 100 Units  into the skin 2 (two) times daily.   ipratropium 0.06 % nasal spray Commonly known as: ATROVENT Place 2 sprays into both nostrils 4 (four) times daily.   isosorbide mononitrate 30 MG 24 hr tablet Commonly known as: IMDUR Take 30 mg by mouth daily.   ketoconazole 2 % shampoo Commonly known as: NIZORAL Apply 1 application topically 2 (two) times a week. tues and thursday   lidocaine 5 % Commonly known as: [place] onto the skin.   lisinopril 40 MG tablet Commonly known as: ZESTRIL Take 40 mg by mouth daily.   melatonin 5 MG Tabs Take 10 mg by mouth at bedtime as needed (sleep).   metFORMIN 500 MG 24 hr tablet Commonly known as: GLUCOPHAGE-XR Take 1,000 mg by mouth 2 (two) times daily.   metoprolol tartrate 100 MG tablet Commonly known as: LOPRESSOR Take 150 mg by mouth 2 (two) times daily.   mirtazapine 45 MG tablet Commonly known as: REMERON Take 45 mg by mouth at bedtime.   multivitamin tablet Take 1 tablet by mouth daily.   naproxen 500 MG tablet Commonly known as: NAPROSYN Take 500 mg by mouth 2 (two) times daily.   nicotine 7 mg/24hr patch Commonly known as: NICODERM CQ - dosed in mg/24 hr Place 1 patch (7 mg total) onto the skin daily for 14 days.   nitroGLYCERIN 0.4 MG SL tablet Commonly known as: NITROSTAT Place 1 tablet (0.4 mg total) under the tongue every 5 (five) minutes as needed for chest pain. Call 911 if you have taken 3 Nitro for chest pain   nortriptyline 25 MG capsule Commonly known as: PAMELOR Take 50 mg by mouth at bedtime.   omega-3 acid ethyl esters 1 g capsule Commonly known as: LOVAZA Take 1 g by mouth 3 (three) times daily.   omeprazole 20 MG capsule Commonly known as: PRILOSEC Take 20 mg by mouth daily.   ondansetron 4 MG disintegrating tablet Commonly known as: ZOFRAN-ODT Take 4 mg by mouth every 8 (eight) hours as needed for nausea or vomiting.   polyethylene glycol 17 g packet Commonly known as: MIRALAX /  GLYCOLAX Take 17 g by mouth 3 (three) times daily.   prednisoLONE acetate 1 % ophthalmic suspension Commonly known as: PRED FORTE Place 1 drop into the right eye daily.   promethazine-dextromethorphan 6.25-15 MG/5ML syrup Commonly known as: PROMETHAZINE-DM Take 5 mLs by mouth 4 (four) times daily as needed.   rosuvastatin 40  MG tablet Commonly known as: CRESTOR TAKE 1 TABLET(40 MG) BY MOUTH DAILY   solifenacin 10 MG tablet Commonly known as: VESICARE Take 10 mg by mouth daily.   spironolactone 50 MG tablet Commonly known as: ALDACTONE Take 100 mg by mouth daily.   T.E.D. Below Knee/Medium Misc 1 each by Does not apply route daily. Measure legs and give appropriate size.   tiZANidine 4 MG tablet Commonly known as: Zanaflex Take 1 tablet (4 mg total) by mouth 3 (three) times daily.   Trulicity 1.5 DX/8.3JA Sopn Generic drug: Dulaglutide Inject 1.5 mg into the skin once a week.        Follow-up Information     Aycock, Ngwe A, MD Follow up.   Specialty: Family Medicine Why: Is follow-up in 1 week to have lactic acid and renal function checked Contact information: [place] [address] [phone]         Minna Merritts, MD .   Specialty: Cardiology Contact information: [place] [address] [phone]                    The results of significant diagnostics from this hospitalization (including imaging, microbiology, ancillary and laboratory) are listed below for reference.    MR FOOT RIGHT W WO CONTRAST  Result Date: 09/12/2021 CLINICAL DATA:  Chronic bilateral foot pain. EXAM: MRI OF THE RIGHT FOREFOOT WITHOUT AND WITH CONTRAST TECHNIQUE: Multiplanar, multisequence MR imaging of the right forefoot was performed before and after the administration of intravenous contrast. CONTRAST:  7.82mL GADAVIST GADOBUTROL 1 MMOL/ML IV SOLN COMPARISON:  None Available. FINDINGS: Bones/Joint/Cartilage No  fracture or dislocation. No joint effusion. No marrow signal abnormality. No periosteal reaction or bone destruction. Developmental deformity of the ankle joint with articulation of the distal tibia and fibula with the calcaneus with mild arthritic changes. 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 Ligaments Lisfranc ligament is intact. Muscles and Tendons Flexor, peroneal and extensor compartment tendons are intact. Severe extensive muscle atrophy. Soft tissue No fluid collection or hematoma. No soft tissue mass. Mild soft tissue edema along the dorsal aspect foot and posterior plantar aspect of the hindfoot which may be reactive versus secondary to mild cellulitis. IMPRESSION: 1. No acute osseous abnormality of the right forefoot. 2. Developmental deformity of the ankle joint with articulation of the distal tibia and fibula with the calcaneus with mild arthritic changes. 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 3. Severe extensive muscle atrophy. 4. Mild soft tissue edema along the dorsal aspect foot and posterior plantar aspect of the hindfoot which may be reactive versus secondary to mild cellulitis. Electronically Signed   By: Kathreen Devoid M.D.   On: 09/12/2021 07:23   MR FOOT LEFT W WO CONTRAST  Addendum Date: 09/12/2021   ADDENDUM REPORT: 09/12/2021 07:21 ADDENDUM: Developmental deformity of the ankle joint with articulation of the distal tibia and fibula with the calcaneus with mild arthritic changes. 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 Electronically Signed   By: Kathreen Devoid M.D.   On: 09/12/2021 07:21   Result Date: 09/12/2021 CLINICAL DATA:  Bilateral chronic foot pain and swelling. EXAM: MRI OF THE LEFT FOREFOOT WITHOUT AND WITH CONTRAST TECHNIQUE: Multiplanar, multisequence MR imaging of the left  forefoot was performed both before and after administration of intravenous contrast.  CONTRAST:  7.57mL GADAVIST GADOBUTROL 1 MMOL/ML IV SOLN COMPARISON:  Foot x-ray 07/23/2021 FINDINGS: Bones/Joint/Cartilage No fracture or dislocation. No joint effusion. No marrow signal abnormality. No periosteal reaction or bone destruction. Developmental deformity of the ankle joint. 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 Ligaments Lisfranc ligament is intact. Muscles and Tendons Flexor, peroneal and extensor compartment tendons are intact. Severe extensive muscle atrophy. Soft tissue No fluid collection or hematoma. No soft tissue mass. Mild soft tissue edema along the dorsal aspect foot and posterior plantar aspect of the hindfoot which may be reactive versus secondary to mild cellulitis. IMPRESSION: 1. No acute osseous abnormality of the left forefoot. 2. Developmental deformity of the ankle joint. 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 3. Severe extensive muscle atrophy. 4. Mild soft tissue edema along the dorsal aspect foot and posterior plantar aspect of the hindfoot which may be reactive versus secondary to mild cellulitis. Electronically Signed: By: Kathreen Devoid M.D. On: 09/12/2021 07:17   DG Foot Complete Right  Result Date: 09/09/2021 CLINICAL DATA:  Foot pain and swelling EXAM: RIGHT FOOT COMPLETE - 3+ VIEW; LEFT FOOT - COMPLETE 3+ VIEW COMPARISON:  None Available. FINDINGS: There is no evidence of fracture or dislocation. Chronic, non weight-bearing deformities of the bilateral mid and hind feet. There is no evidence of arthropathy. Soft tissues are unremarkable. IMPRESSION: No fracture or dislocation of the bilateral feet. Chronic, non weight-bearing deformities. Soft tissues unremarkable. Electronically Signed   By: Delanna Ahmadi M.D.   On: 09/09/2021 19:36   DG Foot Complete Left  Result Date: 09/09/2021 CLINICAL DATA:  Foot pain and swelling EXAM: RIGHT FOOT COMPLETE - 3+  VIEW; LEFT FOOT - COMPLETE 3+ VIEW COMPARISON:  None Available. FINDINGS: There is no evidence of fracture or dislocation. Chronic, non weight-bearing deformities of the bilateral mid and hind feet. There is no evidence of arthropathy. Soft tissues are unremarkable. IMPRESSION: No fracture or dislocation of the bilateral feet. Chronic, non weight-bearing deformities. Soft tissues unremarkable. Electronically Signed   By: Delanna Ahmadi M.D.   On: 09/09/2021 19:36   DG Chest Port 1 View  Result Date: 09/09/2021 CLINICAL DATA:  Questionable sepsis EXAM: PORTABLE CHEST 1 VIEW COMPARISON:  06/05/2021 FINDINGS: The heart size and mediastinal contours are within normal limits. Both lungs are clear. The visualized skeletal structures are unremarkable. IMPRESSION: No acute abnormality of the lungs in AP portable projection. Electronically Signed   By: Delanna Ahmadi M.D.   On: 09/09/2021 19:33   Labs:   Basic Metabolic Panel: Recent Labs  Lab 09/09/21 2000 09/10/21 0453 09/11/21 0538  NA 142 141 140  K 3.7 3.7 4.4  CL 102 103 105  CO2 $Re'28 26 27  'wdR$ GLUCOSE 149* 156* 325*  BUN 33* 25* 28*  CREATININE 1.51* 1.27* 1.45*  CALCIUM 9.7 9.2 10.0     CBC: Recent Labs  Lab 09/09/21 2000 09/10/21 0453  WBC 7.3 5.9  NEUTROABS 4.5  --   HGB 16.8 16.2  HCT 52.7* 49.6  MCV 89.5 88.7  PLT 285 231         SIGNED:   Debbe Odea, MD  [facility] 09/12/2021, 10:59 AM

## 2021-09-12 NOTE — Inpatient Diabetes Management (Signed)
Inpatient Diabetes Program Recommendations  AACE/ADA: New Consensus Statement on Inpatient Glycemic Control   Target Ranges:  Prepandial:   less than 140 mg/dL      Peak postprandial:   less than 180 mg/dL (1-2 hours)      Critically ill patients:  140 - 180 mg/dL    Latest Reference Range & Units 09/11/21 08:10 09/11/21 11:49 09/11/21 17:17 09/11/21 20:32 09/12/21 07:32  Glucose-Capillary 70 - 99 mg/dL 191 (H)  Novolog 23 units    356 (H)  Novolog 23 units   Semglee 140 units 247 (H)  Novolog 16 units  203 (H)  Novolog 2 units   Semglee 90 units 174 (H)  Novolog 11 units  Semglee 140 units    Review of Glycemic Control  Diabetes history: DM2 Outpatient Diabetes medications: Tresiba 100 mg BID, Jardiance 25 mg daily, Trulicity 1.5 mg Qweek Current orders for Inpatient glycemic control: Semglee 140 units BID, Novolog 8 units TID with meals, Novolog 0-15 units TID with meals, Novolog 0-5 units QHS  Inpatient Diabetes Program Recommendations:    Insulin: Noted Semglee increased from 100 units BID to 140 units BID and steroids discontinued. May want to consider decreasing Semglee to 100 units BID.  Thanks, Orlando Penner, RN, MSN, CDCES Diabetes Coordinator Inpatient Diabetes Program [phone] (Team Pager from 8am to 5pm)

## 2021-09-12 NOTE — TOC Transition Note (Signed)
Transition of Care [facility]) - CM/SW Discharge Note   Patient Details  Name: Cory Campbell MRN: [id number] Date of Birth: 1980-04-07  [facility] CM/SW Contact:  Margarito Liner, LCSW Phone Number: 09/12/2021, 10:48 AM   Clinical Narrative:   Patient has orders to discharge home today. No further concerns. CSW signing off.  Final next level of care: Home/Self Care Barriers to Discharge: Barriers Resolved   Patient Goals and CMS Choice Patient states their goals for this hospitalization and ongoing recovery are:: home with wife CMS Medicare.gov Compare Post Acute Care list provided to:: Patient Choice offered to / list presented to : Patient  Discharge Placement                Patient to be transferred to facility by: Wife   Patient and family notified of of transfer: 09/12/21  Discharge Plan and Services                                     Social Determinants of Health (SDOH) Interventions     Readmission Risk Interventions    09/11/2021   10:27 AM  Readmission Risk Prevention Plan  Transportation Screening Complete  PCP or Specialist Appt within 3-5 Days Complete  HRI or Home Care Consult Complete  Social Work Consult for Recovery Care Planning/Counseling Complete  Palliative Care Screening Not Applicable  Medication Review [employer]) Complete

## 2021-09-12 NOTE — Plan of Care (Signed)
Patient discharged to home with family.  Discharge instructions reviewed and given to patient . Awaiting ride for transport.

## 2021-09-12 NOTE — Progress Notes (Signed)
Patient discharged to home with wife.  Pivx2 removed with tip intact.

## 2021-09-13 ENCOUNTER — Ambulatory Visit (INDEPENDENT_AMBULATORY_CARE_PROVIDER_SITE_OTHER): Payer: Medicaid Other

## 2021-09-13 DIAGNOSIS — R209 Unspecified disturbances of skin sensation: Secondary | ICD-10-CM | POA: Diagnosis not present

## 2021-09-13 DIAGNOSIS — R252 Cramp and spasm: Secondary | ICD-10-CM

## 2021-09-14 LAB — CULTURE, BLOOD (ROUTINE X 2)
Culture: NO GROWTH
Culture: NO GROWTH

## 2021-09-20 ENCOUNTER — Other Ambulatory Visit: Payer: Self-pay | Admitting: Cardiovascular Disease

## 2021-09-20 ENCOUNTER — Telehealth: Payer: Self-pay | Admitting: Emergency Medicine

## 2021-09-20 NOTE — Telephone Encounter (Signed)
Called patient. No answer. Lmtcb.  

## 2021-09-20 NOTE — Telephone Encounter (Signed)
-----   Message from Cory Iba, MD sent at 09/18/2021 11:42 AM EDT ----- Normal ABIs, no significant lower extremity arterial disease noted Reason for leg pain does not appear to be from blockages

## 2021-09-20 NOTE — Telephone Encounter (Signed)
Pt is returning call and is requesting call back.  

## 2021-09-21 NOTE — Telephone Encounter (Signed)
Called and spoke with patient. Results reviewed with patient, pt verbalized understanding,  questions (if any) answered.   ?

## 2021-09-25 ENCOUNTER — Emergency Department: Payer: Medicaid Other

## 2021-09-25 ENCOUNTER — Other Ambulatory Visit: Payer: Self-pay

## 2021-09-25 ENCOUNTER — Emergency Department
Admission: EM | Admit: 2021-09-25 | Discharge: 2021-09-25 | Disposition: A | Discharge status: Home / Self Care | Payer: Medicaid Other | Attending: Emergency Medicine | Admitting: Emergency Medicine

## 2021-09-25 ENCOUNTER — Encounter: Payer: Self-pay | Admitting: Intensive Care

## 2021-09-25 DIAGNOSIS — G894 Chronic pain syndrome: Secondary | ICD-10-CM | POA: Insufficient documentation

## 2021-09-25 DIAGNOSIS — I10 Essential (primary) hypertension: Secondary | ICD-10-CM | POA: Insufficient documentation

## 2021-09-25 DIAGNOSIS — M545 Low back pain, unspecified: Secondary | ICD-10-CM | POA: Insufficient documentation

## 2021-09-25 LAB — CBC WITH DIFFERENTIAL/PLATELET
Abs Immature Granulocytes: 0.03 10*3/uL (ref 0.00–0.07)
Basophils Absolute: 0.1 10*3/uL (ref 0.0–0.1)
Basophils Relative: 1 %
Eosinophils Absolute: 0.1 10*3/uL (ref 0.0–0.5)
Eosinophils Relative: 1 %
HCT: 46.8 % (ref 39.0–52.0)
Hemoglobin: 15.2 g/dL (ref 13.0–17.0)
Immature Granulocytes: 0 %
Lymphocytes Relative: 33 %
Lymphs Abs: 2.3 10*3/uL (ref 0.7–4.0)
MCH: 28.5 pg (ref 26.0–34.0)
MCHC: 32.5 g/dL (ref 30.0–36.0)
MCV: 87.6 fL (ref 80.0–100.0)
Monocytes Absolute: 0.3 10*3/uL (ref 0.1–1.0)
Monocytes Relative: 5 %
Neutro Abs: 4.2 10*3/uL (ref 1.7–7.7)
Neutrophils Relative %: 60 %
Platelets: 258 10*3/uL (ref 150–400)
RBC: 5.34 MIL/uL (ref 4.22–5.81)
RDW: 13.6 % (ref 11.5–15.5)
WBC: 6.9 10*3/uL (ref 4.0–10.5)
nRBC: 0 % (ref 0.0–0.2)

## 2021-09-25 LAB — URINALYSIS, ROUTINE W REFLEX MICROSCOPIC
Bilirubin Urine: NEGATIVE
Glucose, UA: >=500 mg/dL — AB
Hgb urine dipstick: NEGATIVE
Ketones, ur: NEGATIVE mg/dL
Leukocytes,Ua: NEGATIVE
Nitrite: NEGATIVE
Protein, ur: 30 mg/dL — AB
Specific Gravity, Urine: 1.025 (ref 1.005–1.030)
pH: 5 (ref 5.0–8.0)

## 2021-09-25 LAB — COMPREHENSIVE METABOLIC PANEL
ALT: 18 U/L (ref 0–44)
AST: 21 U/L (ref 15–41)
Albumin: 4.4 g/dL (ref 3.5–5.0)
Alkaline Phosphatase: 53 U/L (ref 38–126)
Anion gap: 10 (ref 5–15)
BUN: 31 mg/dL — ABNORMAL HIGH (ref 6–20)
CO2: 27 mmol/L (ref 22–32)
Calcium: 9.9 mg/dL (ref 8.9–10.3)
Chloride: 98 mmol/L (ref 98–111)
Creatinine, Ser: 1.15 mg/dL (ref 0.61–1.24)
GFR, Estimated: >60 mL/min (ref >60)
Glucose, Bld: 129 mg/dL — ABNORMAL HIGH (ref 70–99)
Potassium: 4 mmol/L (ref 3.5–5.1)
Sodium: 135 mmol/L (ref 135–145)
Total Bilirubin: 0.8 mg/dL (ref 0.3–1.2)
Total Protein: 7.4 g/dL (ref 6.5–8.1)

## 2021-09-25 MED ORDER — SODIUM CHLORIDE 0.9 % IV BOLUS
1000.0000 mL | Freq: Once | INTRAVENOUS | Status: AC
  Administered 2021-09-25: 1000 mL via INTRAVENOUS

## 2021-09-25 MED ORDER — HYDROMORPHONE HCL 1 MG/ML IJ SOLN
1.0000 mg | Freq: Once | INTRAMUSCULAR | Status: AC
  Administered 2021-09-25: 1 mg via INTRAVENOUS
  Filled 2021-09-25: qty 1

## 2021-09-25 MED ORDER — ONDANSETRON HCL 4 MG/2ML IJ SOLN
4.0000 mg | Freq: Once | INTRAMUSCULAR | Status: AC
Start: 2021-09-25 — End: 2021-09-25
  Administered 2021-09-25: 4 mg via INTRAVENOUS
  Filled 2021-09-25: qty 2

## 2021-09-25 MED ORDER — ACETAMINOPHEN 500 MG PO TABS
1000.0000 mg | ORAL_TABLET | Freq: Once | ORAL | Status: AC
Start: 2021-09-25 — End: 2021-09-25
  Administered 2021-09-25: 1000 mg via ORAL
  Filled 2021-09-25: qty 2

## 2021-09-25 MED ORDER — BUPRENORPHINE 7.5 MCG/HR TD PTWK
1.0000 | MEDICATED_PATCH | TRANSDERMAL | Status: DC
  Administered 2021-09-25: 1 via TRANSDERMAL

## 2021-09-25 MED ORDER — KETOROLAC TROMETHAMINE 30 MG/ML IJ SOLN
15.0000 mg | Freq: Once | INTRAMUSCULAR | Status: AC
Start: 2021-09-25 — End: 2021-09-25
  Administered 2021-09-25: 15 mg via INTRAVENOUS
  Filled 2021-09-25: qty 1

## 2021-09-25 MED ORDER — IOHEXOL 350 MG/ML SOLN
100.0000 mL | Freq: Once | INTRAVENOUS | Status: AC | PRN
Start: 2021-09-25 — End: 2021-09-25
  Administered 2021-09-25: 100 mL via INTRAVENOUS

## 2021-09-25 NOTE — ED Provider Notes (Signed)
Patient received in signout from Dr. Erma Heritage pending reevaluation in the setting of poorly controlled chronic pain.  After his Butrans patch placement and additional dose of Dilaudid, patient reports feeling much better and back to his baseline.  Reports this will hold him over until he can get a refill of his patches as an outpatient.  He is suitable for outpatient management per original plan of care.  We discussed return precautions.  Will require EMS transport home.   Delton [place], MD 09/25/21 [phone]

## 2021-09-25 NOTE — ED Notes (Signed)
Patient to CT at this time

## 2021-09-25 NOTE — ED Provider Notes (Signed)
[facility] Provider Note    Event Date/Time   First MD Initiated Contact with Patient 09/25/21 1217     (approximate)   History   Back Pain and Leg Pain   HPI  Cory Campbell is a 41 y.o. male here with back pain and leg pain.  The patient states that he recently ran out of his pain patch.  He was actually just refilled by [facility].  He states that over the last day, he has had progressively worsening, severe, lower back pain.  He describes this as a stabbing, throbbing, pain localized in his lower back that radiates down towards his bilateral knees.  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  Denies any trauma.  Denies any nausea or vomiting.  No change in his bowel habits.  Denies any new rash.  He was actually just hospitalized for possible cellulitis of his legs although this was deemed to not be infectious.  He has not had any fevers or chills.  No night sweats.     Physical Exam   Triage Vital Signs: ED Triage Vitals  Enc Vitals Group     BP 09/25/21 1129 (!) 131/93     Pulse Rate 09/25/21 1129 75     Resp 09/25/21 1129 20     Temp 09/25/21 1129 98.1 F (36.7 C)     Temp Source 09/25/21 1129 Oral     SpO2 09/25/21 1129 99 %     Weight 09/25/21 1131 173 lb (78.5 kg)     Height 09/25/21 1131 5\' 7"  (1.702 m)     Head Circumference --      Peak Flow --      Pain Score 09/25/21 1131 10     Pain Loc --      Pain Edu? --      Excl. in GC? --     Most recent vital signs: Vitals:   09/25/21 1500 09/25/21 1522  BP: (!) 143/106   Pulse: 99   Resp: 18   Temp:  98 F (36.7 C)  SpO2: 98%      General: Awake, appears uncomfortable due to pain. CV:  Good peripheral perfusion.  Resp:  Normal effort. Lungs clear. Abd:  No distention. No tenderness. Other:  Chronic severe bilateral lower extremity contractures.  Marked tenderness in the lower lumbar spine.   Mild tenderness throughout the paraspinal area of the lower lumbar spine.  Sensation is intact bilateral lower extremities.   ED Results / Procedures / Treatments   Labs (all labs ordered are listed, but only abnormal results are displayed) Labs Reviewed  COMPREHENSIVE METABOLIC PANEL - Abnormal; Notable for the following components:      Result Value   Glucose, Bld 129 (*)    BUN 31 (*)    All other components within normal limits  URINALYSIS, ROUTINE W REFLEX MICROSCOPIC - Abnormal; Notable for the following components:   Color, Urine YELLOW (*)    APPearance HAZY (*)    Glucose, UA >=500 (*)    Protein, ur 30 (*)    Bacteria, UA RARE (*)    All other components within normal limits  CBC WITH DIFFERENTIAL/PLATELET     EKG    RADIOLOGY CT: Diffuse bladder wall thickening, chronic bilateral hip dislocations, gallstones DG knee:no acute abnormality  I also independently reviewed and agree with radiologist interpretations.  PROCEDURES:  Critical Care performed: No   MEDICATIONS ORDERED IN ED: Medications  buprenorphine (BUTRANS) 7.5 MCG/HR 1 patch (1 patch Transdermal Patch Applied 09/25/21 1518)  HYDROmorphone (DILAUDID) injection 1 mg (1 mg Intravenous Given 09/25/21 1237)  ondansetron (ZOFRAN) injection 4 mg (4 mg Intravenous Given 09/25/21 1237)  ketorolac (TORADOL) 30 MG/ML injection 15 mg (15 mg Intravenous Given 09/25/21 1237)  HYDROmorphone (DILAUDID) injection 1 mg (1 mg Intravenous Given 09/25/21 1306)  iohexol (OMNIPAQUE) 350 MG/ML injection 100 mL (100 mLs Intravenous Contrast Given 09/25/21 1336)  sodium chloride 0.9 % bolus 1,000 mL (1,000 mLs Intravenous New Bag/Given 09/25/21 1520)  HYDROmorphone (DILAUDID) injection 1 mg (1 mg Intravenous Given 09/25/21 1520)  acetaminophen (TYLENOL) tablet 1,000 mg (1,000 mg Oral Given 09/25/21 1542)     IMPRESSION / MDM / ASSESSMENT AND PLAN / ED COURSE  I reviewed the triage vital signs and the nursing notes.                                The patient is on the cardiac monitor to evaluate for evidence of arrhythmia and/or significant heart rate changes.   Ddx:  Differential includes the following, with pertinent life- or limb-threatening emergencies considered:  Acute on chronic pain in the setting of running out of his Butrans patch, worsening lumbar radiculopathy, sacroiliitis, less likely intra-abdominal pathology such as nephrolithiasis, UTI, bladder spasm, retention, dissection  Patient's presentation is most consistent with acute presentation with potential threat to life or bodily function.  MDM:  41 year old male here with severe lower back pain.  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  He is out of his Butrans patch which I suspect is the etiology for his acute on chronic pain exacerbation.  He denies any new neurological deficits.  He does, however, appear to be severely in pain and is markedly hypertensive, complaining of pain radiating that is sharp and tearing in his back.  Patient subsequently sent for CT angio which fortunately shows no evidence of dissection or aneurysm.  He has no other acute abnormality noted on imaging.  Plain films of the knee are negative.  Denies any fever, chills, or infectious symptoms to suggest epidural abscess, osteo, or other infectious process.  CBC is without leukocytosis or anemia.  CMP unremarkable.  Urinalysis negative for UTI.  We will plan to treat with additional dose of Dilaudid, place Butrans patch, and patient has outpatient follow-up with his pain clinic arranged on Monday.  We will plan to hopefully discharge once pain is adequately controlled.  No other apparent emergent medical condition.   MEDICATIONS GIVEN IN ED: Medications  buprenorphine (BUTRANS) 7.5 MCG/HR 1 patch (1 patch Transdermal Patch Applied 09/25/21 1518)  HYDROmorphone (DILAUDID) injection 1 mg (1 mg Intravenous Given  09/25/21 1237)  ondansetron (ZOFRAN) injection 4 mg (4 mg Intravenous Given 09/25/21 1237)  ketorolac (TORADOL) 30 MG/ML injection 15 mg (15 mg Intravenous Given 09/25/21 1237)  HYDROmorphone (DILAUDID) injection 1 mg (1 mg Intravenous Given 09/25/21 1306)  iohexol (OMNIPAQUE) 350 MG/ML injection 100 mL (100 mLs Intravenous Contrast Given 09/25/21 1336)  sodium chloride 0.9 % bolus 1,000 mL (1,000 mLs Intravenous New Bag/Given 09/25/21 1520)  HYDROmorphone (DILAUDID) injection 1 mg (1 mg Intravenous Given 09/25/21 1520)  acetaminophen (TYLENOL) tablet 1,000 mg (1,000 mg Oral Given 09/25/21 1542)     Consults:     EMR reviewed  Reviewed prior  pain clinic notes from [facility]     FINAL CLINICAL IMPRESSION(S) / ED DIAGNOSES   Final diagnoses:  Acute midline low back pain without sciatica  Chronic pain syndrome     Rx / DC Orders   ED Discharge Orders     None        Note:  This document was prepared using Dragon voice recognition software and may include unintentional dictation errors.   Shaune Pollack, MD 09/25/21 [phone]

## 2021-09-25 NOTE — ED Triage Notes (Signed)
Pt in via EMS from home with c/o back pain that radiates down to his legs. Pt was seen for the same several weeks ago and was admitted. CBG 107, HR 80's

## 2021-09-30 ENCOUNTER — Ambulatory Visit: Payer: Medicaid Other | Admitting: Medical

## 2021-10-13 ENCOUNTER — Ambulatory Visit
Admission: EM | Admit: 2021-10-13 | Discharge: 2021-10-13 | Disposition: A | Discharge status: Home / Self Care | Payer: Medicaid Other | Attending: Emergency Medicine | Admitting: Emergency Medicine

## 2021-10-13 DIAGNOSIS — N390 Urinary tract infection, site not specified: Secondary | ICD-10-CM | POA: Insufficient documentation

## 2021-10-13 DIAGNOSIS — R319 Hematuria, unspecified: Secondary | ICD-10-CM | POA: Diagnosis not present

## 2021-10-13 LAB — CBC WITH DIFFERENTIAL/PLATELET
Abs Immature Granulocytes: 0.02 10*3/uL (ref 0.00–0.07)
Basophils Absolute: 0 10*3/uL (ref 0.0–0.1)
Basophils Relative: 0 %
Eosinophils Absolute: 0.1 10*3/uL (ref 0.0–0.5)
Eosinophils Relative: 1 %
HCT: 45.6 % (ref 39.0–52.0)
Hemoglobin: 14.9 g/dL (ref 13.0–17.0)
Immature Granulocytes: 0 %
Lymphocytes Relative: 23 %
Lymphs Abs: 1.6 10*3/uL (ref 0.7–4.0)
MCH: 29.2 pg (ref 26.0–34.0)
MCHC: 32.7 g/dL (ref 30.0–36.0)
MCV: 89.2 fL (ref 80.0–100.0)
Monocytes Absolute: 0.5 10*3/uL (ref 0.1–1.0)
Monocytes Relative: 7 %
Neutro Abs: 4.6 10*3/uL (ref 1.7–7.7)
Neutrophils Relative %: 69 %
Platelets: 267 10*3/uL (ref 150–400)
RBC: 5.11 MIL/uL (ref 4.22–5.81)
RDW: 14.6 % (ref 11.5–15.5)
WBC: 6.7 10*3/uL (ref 4.0–10.5)
nRBC: 0 % (ref 0.0–0.2)

## 2021-10-13 LAB — URINALYSIS, ROUTINE W REFLEX MICROSCOPIC
Bilirubin Urine: NEGATIVE
Glucose, UA: 250 mg/dL — AB
Ketones, ur: NEGATIVE mg/dL
Nitrite: NEGATIVE
Protein, ur: NEGATIVE mg/dL
Specific Gravity, Urine: <1.005 — ABNORMAL LOW (ref 1.005–1.030)
pH: 5.5 (ref 5.0–8.0)

## 2021-10-13 LAB — BASIC METABOLIC PANEL
Anion gap: 8 (ref 5–15)
BUN: 11 mg/dL (ref 6–20)
CO2: 30 mmol/L (ref 22–32)
Calcium: 9.8 mg/dL (ref 8.9–10.3)
Chloride: 100 mmol/L (ref 98–111)
Creatinine, Ser: 1 mg/dL (ref 0.61–1.24)
GFR, Estimated: >60 mL/min (ref >60)
Glucose, Bld: 106 mg/dL — ABNORMAL HIGH (ref 70–99)
Potassium: 3.7 mmol/L (ref 3.5–5.1)
Sodium: 138 mmol/L (ref 135–145)

## 2021-10-13 LAB — URINALYSIS, MICROSCOPIC (REFLEX)

## 2021-10-13 MED ORDER — CEPHALEXIN 500 MG PO CAPS: 500.0000 mg | ORAL_CAPSULE | Freq: Four times a day (QID) | ORAL | 0 refills | Status: AC

## 2021-10-13 NOTE — ED Provider Notes (Signed)
HPI  SUBJECTIVE:  Cory Campbell is a 41 y.o. male who presents with several days of urinary urgency, frequency, cloudy, odorous urine and 2 episodes of gross hematuria today.  No dysuria.  No nausea, vomiting, fevers, abdominal, back, pelvic pain, penile discharge, testicular pain or swelling, perineal pain or swelling.  He reports a nonpainful, nonpruritic red rash on his penis starting today.  It does not burn.  No blisters.  No new lotions, soaps, detergents.  He is in a long-term monogamous relationship with a male, who is asymptomatic.  STDs are not a concern today.  No aggravating or alleviating factors.  He has not tried anything for this. Patient has an extensive medical history including asthma, BPH, diabetes, HIV, hypertension, hyperlipidemia, seizure disorder, hypothyroidism, chronic diastolic CHF, arthrogryposis,  3A chronic kidney disease, COPD, HSV, admitted for sepsis with acute kidney injury last month on 8/4, was sent home with Keflex.  [facility]  Past Medical History:  Diagnosis Date   (HFpEF) heart failure with preserved ejection fraction (HCC)    Acid reflux    Adopted    Anxiety    Arthritis    Arthrogryposis    Asthma    BPH (benign prostatic hyperplasia)    Chronic pain syndrome    Depressive disorder, not elsewhere classified    Diabetes mellitus without complication ([place])    Diverticulitis of colon (without mention of hemorrhage)(562.11)    ED (erectile dysfunction)    Herpes genitalis    HIV infection ([place])    HTN (hypertension)    Hyperlipidemia    Hypertensive cardiomyopathy ([place])    Hypogonadism in male    OAB (overactive bladder)    Other psoriasis    Seizure disorder (HCC)    Sleep apnea    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 or other cause, without mention of thyrotoxic crisis or storm    hyperthyroidism    Past Surgical History:  Procedure Laterality Date   APPENDECTOMY     COLOSTOMY     CORNEAL TRANSPLANT     feet  surgery     both   HAND SURGERY     left and right   leg surgery     left and right   ORTHOPEDIC SURGERY     hands, feet, knees, legs   tubes in ears     both    Family History  Adopted: Yes  Family history unknown: Yes    Social History   Tobacco Use   Smoking status: Every Day    Types: Pipe, Cigars, E-cigarettes    Last attempt to quit: 01/2018    Years since quitting: 3.7   Smokeless tobacco: Never   Tobacco comments:    Declines patch  Vaping Use   Vaping Use: Some days   Substances: Nicotine, Flavoring  Substance Use Topics   Alcohol use: Yes    Comment: occasional   Drug use: No    No current facility-administered medications for this encounter.  Current Outpatient Medications:    blood glucose meter kit and supplies, Use to check blood glucose five times per day. DX: E11.65, Disp: , Rfl:    cephALEXin (KEFLEX) 500 MG capsule, Take 1 capsule (500 mg total) by mouth 4 (four) times daily for 10 days., Disp: 40 capsule, Rfl: 0   Continuous Blood Gluc Sensor (DEXCOM G7 SENSOR) MISC, Use every 10 days., Disp: , Rfl:    gabapentin (NEURONTIN) 300 MG capsule, Take by mouth., Disp: , Rfl:  acyclovir (ZOVIRAX) 400 MG tablet, Take 400 mg by mouth 2 (two) times daily., Disp: , Rfl:    albuterol (PROVENTIL HFA;VENTOLIN HFA) 108 (90 BASE) MCG/ACT inhaler, Inhale 2 puffs into the lungs every 4 (four) hours as needed for wheezing or shortness of breath. , Disp: , Rfl:    albuterol (PROVENTIL) (2.5 MG/3ML) 0.083% nebulizer solution, Take 2.5 mg by nebulization every 4 (four) hours as needed for wheezing or shortness of breath., Disp: , Rfl:    amLODipine (NORVASC) 10 MG tablet, Take 10 mg by mouth daily., Disp: , Rfl:    aspirin EC 81 MG tablet, Take 81 mg by mouth daily. Swallow whole., Disp: , Rfl:    BIKTARVY 50-200-25 MG TABS tablet, Take 1 tablet by mouth daily., Disp: , Rfl: 1   buprenorphine (BUTRANS) 7.5 MCG/HR, Place 7.5 mg onto the skin once a week. Saturday, Disp:  , Rfl:    buPROPion (WELLBUTRIN XL) 150 MG 24 hr tablet, Take 150 mg by mouth daily., Disp: , Rfl:    cetirizine (ZYRTEC) 10 MG tablet, Take 10 mg by mouth daily., Disp: , Rfl:    citalopram (CELEXA) 20 MG tablet, Take 20 mg by mouth daily., Disp: , Rfl:    cloNIDine (CATAPRES - DOSED IN MG/24 HR) 0.3 mg/24hr patch, 0.3 mg once a week., Disp: , Rfl:    diclofenac sodium (VOLTAREN) 1 % GEL, Apply 2-4 g topically 4 (four) times daily as needed. For pain., Disp: , Rfl:    dicyclomine (BENTYL) 10 MG capsule, Take 20 mg by mouth 2 (two) times daily before a meal., Disp: , Rfl:    Elastic Bandages & Supports (T.E.D. BELOW KNEE/MEDIUM) MISC, 1 each by Does not apply route daily. Measure legs and give appropriate size., Disp: 3 each, Rfl: 0   empagliflozin (JARDIANCE) 25 MG TABS tablet, Take 25 mg by mouth daily., Disp: , Rfl:    famotidine (PEPCID) 20 MG tablet, Take 20 mg by mouth daily. , Disp: , Rfl:    feeding supplement, GLUCERNA SHAKE, (GLUCERNA SHAKE) LIQD, Take 237 mLs by mouth 4 (four) times daily., Disp: , Rfl:    finasteride (PROSCAR) 5 MG tablet, Take 5 mg by mouth daily., Disp: , Rfl:    fluticasone (FLONASE) 50 MCG/ACT nasal spray, Place 2 sprays into both nostrils daily., Disp: , Rfl:    fluticasone (FLOVENT HFA) 110 MCG/ACT inhaler, Inhale 2 puffs into the lungs 2 (two) times daily., Disp: , Rfl:    gabapentin (NEURONTIN) 300 MG capsule, Take 1 capsule (300 mg total) by mouth 2 (two) times daily., Disp: 60 capsule, Rfl: 0   glucose 4 GM chewable tablet, Chew 1 tablet by mouth as needed for low blood sugar., Disp: , Rfl:    insulin degludec (TRESIBA) 100 UNIT/ML FlexTouch Pen, Inject 100 Units into the skin 2 (two) times daily., Disp: , Rfl:    ipratropium (ATROVENT) 0.06 % nasal spray, Place 2 sprays into both nostrils 4 (four) times daily., Disp: 15 mL, Rfl: 12   isosorbide mononitrate (IMDUR) 30 MG 24 hr tablet, Take 30 mg by mouth daily., Disp: , Rfl:    ketoconazole (NIZORAL) 2 %  shampoo, Apply 1 application topically 2 (two) times a week. tues and thursday, Disp: , Rfl:    lidocaine (LIDODERM) 5 %, Place onto the skin., Disp: , Rfl:    lisinopril (ZESTRIL) 40 MG tablet, Take 40 mg by mouth daily., Disp: , Rfl:    Melatonin 5 MG TABS, Take 10 mg by  mouth at bedtime as needed (sleep)., Disp: , Rfl:    metFORMIN (GLUCOPHAGE-XR) 500 MG 24 hr tablet, Take 1,000 mg by mouth 2 (two) times daily., Disp: , Rfl:    metoprolol tartrate (LOPRESSOR) 100 MG tablet, Take 150 mg by mouth 2 (two) times daily., Disp: , Rfl:    mirtazapine (REMERON) 45 MG tablet, Take 45 mg by mouth at bedtime., Disp: , Rfl:    Multiple Vitamin (MULTIVITAMIN) tablet, Take 1 tablet by mouth daily., Disp: , Rfl:    naproxen (NAPROSYN) 500 MG tablet, Take 500 mg by mouth 2 (two) times daily., Disp: , Rfl:    nitroGLYCERIN (NITROSTAT) 0.4 MG SL tablet, PLACE 1 TABLET SUBLINGUALLY EVERY 5 MINUTES AS NEEDED FOR CHEST PAIN. CALL 911 IF YOU HAVE TAKEN 3 NITRO FOR CHEST PAIN, Disp: 100 tablet, Rfl: 0   nortriptyline (PAMELOR) 25 MG capsule, Take 50 mg by mouth at bedtime., Disp: , Rfl:    omega-3 acid ethyl esters (LOVAZA) 1 g capsule, Take 1 g by mouth 3 (three) times daily., Disp: , Rfl:    omeprazole (PRILOSEC) 20 MG capsule, Take 20 mg by mouth daily., Disp: , Rfl:    ondansetron (ZOFRAN-ODT) 4 MG disintegrating tablet, Take 4 mg by mouth every 8 (eight) hours as needed for nausea or vomiting., Disp: , Rfl:    polyethylene glycol (MIRALAX / GLYCOLAX) 17 g packet, Take 17 g by mouth 3 (three) times daily., Disp: , Rfl:    prednisoLONE acetate (PRED FORTE) 1 % ophthalmic suspension, Place 1 drop into the right eye daily. , Disp: , Rfl:    rosuvastatin (CRESTOR) 40 MG tablet, TAKE 1 TABLET(40 MG) BY MOUTH DAILY, Disp: 90 tablet, Rfl: 0   solifenacin (VESICARE) 10 MG tablet, Take 10 mg by mouth daily., Disp: , Rfl:    Spacer/Aero-Holding Chambers (AEROCHAMBER PLUS) inhaler, Use as instructed, Disp: 1 each, Rfl: 2    spironolactone (ALDACTONE) 50 MG tablet, Take 100 mg by mouth daily., Disp: , Rfl:    tiZANidine (ZANAFLEX) 4 MG tablet, Take 1 tablet (4 mg total) by mouth 3 (three) times daily., Disp: 90 tablet, Rfl: 11   TRULICITY 1.5 KP/5.4SF SOPN, Inject 1.5 mg into the skin once a week., Disp: , Rfl:    TRULICITY 4.5 KC/1.2XN SOPN, SMARTSIG:4.5 Milligram(s) SUB-Q Once a Week, Disp: , Rfl:   Allergies  Allergen Reactions   Penicillins Hives, Itching and Rash    Has patient had a PCN reaction causing immediate rash, facial/tongue/throat swelling, SOB or lightheadedness with hypotension: Yes Has patient had a PCN reaction causing severe rash involving mucus membranes or skin necrosis: No Has patient had a PCN reaction that required hospitalization No Has patient had a PCN reaction occurring within the last 10 years: No If all of the above answers are "NO", then may proceed with Cephalosporin use.    Erythromycin Base Itching and Rash     ROS  As noted in HPI.   Physical Exam  BP (!) 144/111 (BP Location: Left Arm)   Pulse 96   Temp 98.5 F (36.9 C) (Oral)   Resp 18   Ht $R'5\' 7"'ka$  (1.702 m)   Wt 78.5 kg   SpO2 100%   BMI 27.10 kg/m  BP Readings from Last 3 Encounters:  10/13/21 (!) 144/111  09/25/21 (!) 150/108  09/12/21 (!) 145/115    Constitutional: Well developed, well nourished, no acute distress Eyes:  EOMI, conjunctiva normal bilaterally HENT: Normocephalic, atraumatic,mucus membranes moist Respiratory: Normal inspiratory effort Cardiovascular:  Normal rate GI: nondistended soft, nontender, no suprapubic, flank tenderness Back: No CVAT GU: Normal uncircumcised male, no balanitis.  Glans normal.  No penile rash, discharge.  Chaperone present during exam.  No appreciable inguinal lymphadenopathy. Rectal: Patient declined skin: No rash, skin intact Musculoskeletal: no deformities Neurologic: Alert & oriented x 3, no focal neuro deficits Psychiatric: Speech and behavior  appropriate   ED Course   Medications - No data to display  Orders Placed This Encounter  Procedures   Urine Culture    Standing Status:   Standing    Number of Occurrences:   1    Order Specific Question:   Indication    Answer:   Acute gross hematuria   Urinalysis, Routine w reflex microscopic Urine, Clean Catch    Standing Status:   Standing    Number of Occurrences:   1   Urinalysis, Microscopic (reflex)    Standing Status:   Standing    Number of Occurrences:   1   Basic metabolic panel    Standing Status:   Standing    Number of Occurrences:   1   CBC with Differential    Standing Status:   Standing    Number of Occurrences:   1    Results for orders placed or performed during the hospital encounter of 10/13/21 (from the past 24 hour(s))  Urinalysis, Routine w reflex microscopic Urine, Clean Catch     Status: Abnormal   Collection Time: 10/13/21  5:23 PM  Result Value Ref Range   Color, Urine YELLOW YELLOW   APPearance HAZY (A) CLEAR   Specific Gravity, Urine <1.005 (L) 1.005 - 1.030   pH 5.5 5.0 - 8.0   Glucose, UA 250 (A) NEGATIVE mg/dL   Hgb urine dipstick MODERATE (A) NEGATIVE   Bilirubin Urine NEGATIVE NEGATIVE   Ketones, ur NEGATIVE NEGATIVE mg/dL   Protein, ur NEGATIVE NEGATIVE mg/dL   Nitrite NEGATIVE NEGATIVE   Leukocytes,Ua MODERATE (A) NEGATIVE  Urinalysis, Microscopic (reflex)     Status: Abnormal   Collection Time: 10/13/21  5:23 PM  Result Value Ref Range   RBC / HPF 21-50 0 - 5 RBC/hpf   WBC, UA TOO NUMEROUS TO COUNT 0 - 5 WBC/hpf   Bacteria, UA FEW (A) NONE SEEN   Squamous Epithelial / LPF 0-5 0 - 5  Basic metabolic panel     Status: Abnormal   Collection Time: 10/13/21  6:54 PM  Result Value Ref Range   Sodium 138 135 - 145 mmol/L   Potassium 3.7 3.5 - 5.1 mmol/L   Chloride 100 98 - 111 mmol/L   CO2 30 22 - 32 mmol/L   Glucose, Bld 106 (H) 70 - 99 mg/dL   BUN 11 6 - 20 mg/dL   Creatinine, Ser 1.00 0.61 - 1.24 mg/dL   Calcium 9.8 8.9  - 10.3 mg/dL   GFR, Estimated >60 >60 mL/min   Anion gap 8 5 - 15  CBC with Differential     Status: None   Collection Time: 10/13/21  6:55 PM  Result Value Ref Range   WBC 6.7 4.0 - 10.5 K/uL   RBC 5.11 4.22 - 5.81 MIL/uL   Hemoglobin 14.9 13.0 - 17.0 g/dL   HCT 45.6 39.0 - 52.0 %   MCV 89.2 80.0 - 100.0 fL   MCH 29.2 26.0 - 34.0 pg   MCHC 32.7 30.0 - 36.0 g/dL   RDW 14.6 11.5 - 15.5 %   Platelets 267 150 -  400 K/uL   nRBC 0.0 0.0 - 0.2 %   Neutrophils Relative % 69 %   Neutro Abs 4.6 1.7 - 7.7 K/uL   Lymphocytes Relative 23 %   Lymphs Abs 1.6 0.7 - 4.0 K/uL   Monocytes Relative 7 %   Monocytes Absolute 0.5 0.1 - 1.0 K/uL   Eosinophils Relative 1 %   Eosinophils Absolute 0.1 0.0 - 0.5 K/uL   Basophils Relative 0 %   Basophils Absolute 0.0 0.0 - 0.1 K/uL   Immature Granulocytes 0 %   Abs Immature Granulocytes 0.02 0.00 - 0.07 K/uL   No results found.  ED Clinical Impression  1. Urinary tract infection with hematuria, site unspecified      ED Assessment/Plan     Outside records reviewed additional medical history obtained.  As noted in HPI.  Patient has diluted urine, moderate hematuria and leukocytes.  He has pyuria, few bacteria.  This is a clean specimen.  He also has some glucosurea, but this is not new.  Do not think he has an obstructing kidney stone in the absence of pelvic, back, flank pain or tenderness.  Deferring imaging.   will send urine off for culture to confirm diagnosis and antibiotic choice.  He appears nontoxic, his vitals are normal except for elevated blood pressure, which has been consistent on past visits.  Afebrile, no other systemic symptoms.  Doubt pyelonephritis.  Checking a BMP as he has a history of chronic kidney disease stage IIIa, and his CBC.  Was unable to appreciate a penile rash.  He has no discharge.  STDs are not a concern today.  STD testing not done.  CBC, BMP within normal limits.  Acute kidney injury has resolved.  He has a GFR  above 60.  Will send home with Keflex for complicated urinary tract infection.  He has tolerated Keflex before.  Discussed labs,MDM, treatment plan, and plan for follow-up with patient. Discussed sn/sx that should prompt return to the ED. patient agrees with plan.   Meds ordered this encounter  Medications   cephALEXin (KEFLEX) 500 MG capsule    Sig: Take 1 capsule (500 mg total) by mouth 4 (four) times daily for 10 days.    Dispense:  40 capsule    Refill:  0      *This clinic note was created using [employer]. Therefore, there may be occasional mistakes despite careful proofreading.  ?    Melynda Ripple, MD 10/13/21 [phone]

## 2021-10-13 NOTE — ED Triage Notes (Addendum)
Pt states rash on pubic area and when he urinates he has blood in urine x 1 day.

## 2021-10-13 NOTE — Discharge Instructions (Addendum)
Continue drinking plenty of fluids.  Your blood work came back normal.  Your kidneys are working normally.  Finish the Keflex, even if you feel better.  Go to the emergency department for fevers above 100.4, abdominal, back, pelvic pain, if you are unable to completely empty your bladder, or for any of the other signs and symptoms we discussed.

## 2021-10-16 LAB — URINE CULTURE: Culture: >=100000 — AB

## 2021-10-17 ENCOUNTER — Telehealth: Payer: Self-pay | Admitting: Emergency Medicine

## 2021-10-17 MED ORDER — NITROFURANTOIN MONOHYD MACRO 100 MG PO CAPS: 100.0000 mg | ORAL_CAPSULE | Freq: Two times a day (BID) | ORAL | 0 refills | Status: AC

## 2021-10-17 NOTE — Telephone Encounter (Signed)
Patient needs to be on Macrobid per urine culture.  I have E prescribed Macrobid to the pharmacy on record and have sent patient a MyChart note.

## 2021-10-30 ENCOUNTER — Other Ambulatory Visit: Payer: Self-pay | Admitting: Cardiovascular Disease

## 2021-11-08 ENCOUNTER — Ambulatory Visit: Payer: Medicaid Other | Admitting: Physician Assistant

## 2021-11-24 ENCOUNTER — Encounter: Payer: Self-pay | Admitting: Intensive Care

## 2021-11-24 ENCOUNTER — Inpatient Hospital Stay
Admission: EM | Admit: 2021-11-24 | Discharge: 2021-11-28 | DRG: 689 | Disposition: A | Discharge status: Home / Self Care | Payer: Medicaid Other | Attending: Internal Medicine | Admitting: Internal Medicine

## 2021-11-24 ENCOUNTER — Emergency Department: Payer: Medicaid Other

## 2021-11-24 ENCOUNTER — Other Ambulatory Visit: Payer: Self-pay

## 2021-11-24 DIAGNOSIS — F17290 Nicotine dependence, other tobacco product, uncomplicated: Secondary | ICD-10-CM | POA: Diagnosis present

## 2021-11-24 DIAGNOSIS — E1165 Type 2 diabetes mellitus with hyperglycemia: Secondary | ICD-10-CM | POA: Diagnosis present

## 2021-11-24 DIAGNOSIS — K5641 Fecal impaction: Secondary | ICD-10-CM | POA: Diagnosis present

## 2021-11-24 DIAGNOSIS — Z794 Long term (current) use of insulin: Secondary | ICD-10-CM

## 2021-11-24 DIAGNOSIS — R109 Unspecified abdominal pain: Secondary | ICD-10-CM | POA: Diagnosis present

## 2021-11-24 DIAGNOSIS — Z6825 Body mass index (BMI) 25.0-25.9, adult: Secondary | ICD-10-CM | POA: Diagnosis not present

## 2021-11-24 DIAGNOSIS — K219 Gastro-esophageal reflux disease without esophagitis: Secondary | ICD-10-CM | POA: Diagnosis present

## 2021-11-24 DIAGNOSIS — E1169 Type 2 diabetes mellitus with other specified complication: Secondary | ICD-10-CM

## 2021-11-24 DIAGNOSIS — F32A Depression, unspecified: Secondary | ICD-10-CM | POA: Diagnosis present

## 2021-11-24 DIAGNOSIS — Z7984 Long term (current) use of oral hypoglycemic drugs: Secondary | ICD-10-CM

## 2021-11-24 DIAGNOSIS — I10 Essential (primary) hypertension: Secondary | ICD-10-CM | POA: Diagnosis present

## 2021-11-24 DIAGNOSIS — K5732 Diverticulitis of large intestine without perforation or abscess without bleeding: Secondary | ICD-10-CM | POA: Diagnosis present

## 2021-11-24 DIAGNOSIS — G894 Chronic pain syndrome: Secondary | ICD-10-CM | POA: Diagnosis present

## 2021-11-24 DIAGNOSIS — I5032 Chronic diastolic (congestive) heart failure: Secondary | ICD-10-CM | POA: Diagnosis present

## 2021-11-24 DIAGNOSIS — Z7985 Long-term (current) use of injectable non-insulin antidiabetic drugs: Secondary | ICD-10-CM

## 2021-11-24 DIAGNOSIS — N3000 Acute cystitis without hematuria: Secondary | ICD-10-CM

## 2021-11-24 DIAGNOSIS — E876 Hypokalemia: Secondary | ICD-10-CM

## 2021-11-24 DIAGNOSIS — B20 Human immunodeficiency virus [HIV] disease: Secondary | ICD-10-CM

## 2021-11-24 DIAGNOSIS — E039 Hypothyroidism, unspecified: Secondary | ICD-10-CM | POA: Diagnosis present

## 2021-11-24 DIAGNOSIS — F419 Anxiety disorder, unspecified: Secondary | ICD-10-CM | POA: Diagnosis present

## 2021-11-24 DIAGNOSIS — J45909 Unspecified asthma, uncomplicated: Secondary | ICD-10-CM | POA: Diagnosis present

## 2021-11-24 DIAGNOSIS — R7989 Other specified abnormal findings of blood chemistry: Secondary | ICD-10-CM | POA: Diagnosis present

## 2021-11-24 DIAGNOSIS — N400 Enlarged prostate without lower urinary tract symptoms: Secondary | ICD-10-CM | POA: Diagnosis present

## 2021-11-24 DIAGNOSIS — E663 Overweight: Secondary | ICD-10-CM | POA: Diagnosis present

## 2021-11-24 DIAGNOSIS — K6289 Other specified diseases of anus and rectum: Secondary | ICD-10-CM | POA: Diagnosis present

## 2021-11-24 DIAGNOSIS — J4520 Mild intermittent asthma, uncomplicated: Secondary | ICD-10-CM | POA: Diagnosis not present

## 2021-11-24 DIAGNOSIS — I110 Hypertensive heart disease with heart failure: Secondary | ICD-10-CM | POA: Diagnosis present

## 2021-11-24 DIAGNOSIS — Z947 Corneal transplant status: Secondary | ICD-10-CM

## 2021-11-24 DIAGNOSIS — E785 Hyperlipidemia, unspecified: Secondary | ICD-10-CM | POA: Diagnosis present

## 2021-11-24 DIAGNOSIS — F17210 Nicotine dependence, cigarettes, uncomplicated: Secondary | ICD-10-CM | POA: Diagnosis present

## 2021-11-24 DIAGNOSIS — Z21 Asymptomatic human immunodeficiency virus [HIV] infection status: Secondary | ICD-10-CM | POA: Diagnosis present

## 2021-11-24 DIAGNOSIS — N390 Urinary tract infection, site not specified: Principal | ICD-10-CM

## 2021-11-24 DIAGNOSIS — Z7982 Long term (current) use of aspirin: Secondary | ICD-10-CM

## 2021-11-24 DIAGNOSIS — F17200 Nicotine dependence, unspecified, uncomplicated: Secondary | ICD-10-CM | POA: Diagnosis not present

## 2021-11-24 DIAGNOSIS — B952 Enterococcus as the cause of diseases classified elsewhere: Secondary | ICD-10-CM | POA: Diagnosis present

## 2021-11-24 DIAGNOSIS — R1010 Upper abdominal pain, unspecified: Secondary | ICD-10-CM | POA: Diagnosis not present

## 2021-11-24 DIAGNOSIS — Z9049 Acquired absence of other specified parts of digestive tract: Secondary | ICD-10-CM

## 2021-11-24 DIAGNOSIS — Z881 Allergy status to other antibiotic agents status: Secondary | ICD-10-CM

## 2021-11-24 DIAGNOSIS — F418 Other specified anxiety disorders: Secondary | ICD-10-CM | POA: Diagnosis not present

## 2021-11-24 DIAGNOSIS — Z880 Allergy status to penicillin: Secondary | ICD-10-CM

## 2021-11-24 DIAGNOSIS — K859 Acute pancreatitis without necrosis or infection, unspecified: Secondary | ICD-10-CM | POA: Diagnosis present

## 2021-11-24 DIAGNOSIS — Z79899 Other long term (current) drug therapy: Secondary | ICD-10-CM

## 2021-11-24 DIAGNOSIS — K760 Fatty (change of) liver, not elsewhere classified: Secondary | ICD-10-CM | POA: Diagnosis present

## 2021-11-24 DIAGNOSIS — Z993 Dependence on wheelchair: Secondary | ICD-10-CM

## 2021-11-24 DIAGNOSIS — E119 Type 2 diabetes mellitus without complications: Secondary | ICD-10-CM

## 2021-11-24 HISTORY — DX: Bronchitis, not specified as acute or chronic: J40

## 2021-11-24 LAB — URINALYSIS, ROUTINE W REFLEX MICROSCOPIC
Bacteria, UA: NONE SEEN
Bilirubin Urine: NEGATIVE
Glucose, UA: >=500 mg/dL — AB
Ketones, ur: 5 mg/dL — AB
Nitrite: NEGATIVE
Protein, ur: 100 mg/dL — AB
Specific Gravity, Urine: 1.03 (ref 1.005–1.030)
WBC, UA: >50 WBC/hpf — ABNORMAL HIGH (ref 0–5)
pH: 6 (ref 5.0–8.0)

## 2021-11-24 LAB — GLUCOSE, CAPILLARY
Glucose-Capillary: 211 mg/dL — ABNORMAL HIGH (ref 70–99)
Glucose-Capillary: 172 mg/dL — ABNORMAL HIGH (ref 70–99)

## 2021-11-24 LAB — COMPREHENSIVE METABOLIC PANEL
ALT: 25 U/L (ref 0–44)
AST: 28 U/L (ref 15–41)
Albumin: 4.3 g/dL (ref 3.5–5.0)
Alkaline Phosphatase: 127 U/L — ABNORMAL HIGH (ref 38–126)
Anion gap: 10 (ref 5–15)
BUN: 13 mg/dL (ref 6–20)
CO2: 32 mmol/L (ref 22–32)
Calcium: 10.2 mg/dL (ref 8.9–10.3)
Chloride: 104 mmol/L (ref 98–111)
Creatinine, Ser: 1.16 mg/dL (ref 0.61–1.24)
GFR, Estimated: >60 mL/min (ref >60)
Glucose, Bld: 169 mg/dL — ABNORMAL HIGH (ref 70–99)
Potassium: 2.4 mmol/L — CL (ref 3.5–5.1)
Sodium: 146 mmol/L — ABNORMAL HIGH (ref 135–145)
Total Bilirubin: 0.9 mg/dL (ref 0.3–1.2)
Total Protein: 7.8 g/dL (ref 6.5–8.1)

## 2021-11-24 LAB — CBC
HCT: 52.3 % — ABNORMAL HIGH (ref 39.0–52.0)
Hemoglobin: 16.6 g/dL (ref 13.0–17.0)
MCH: 28.4 pg (ref 26.0–34.0)
MCHC: 31.7 g/dL (ref 30.0–36.0)
MCV: 89.6 fL (ref 80.0–100.0)
Platelets: 259 10*3/uL (ref 150–400)
RBC: 5.84 MIL/uL — ABNORMAL HIGH (ref 4.22–5.81)
RDW: 13.5 % (ref 11.5–15.5)
WBC: 5.7 10*3/uL (ref 4.0–10.5)
nRBC: 0 % (ref 0.0–0.2)

## 2021-11-24 LAB — MAGNESIUM: Magnesium: 2.4 mg/dL (ref 1.7–2.4)

## 2021-11-24 LAB — LACTIC ACID, PLASMA: Lactic Acid, Venous: 3 mmol/L (ref 0.5–1.9)

## 2021-11-24 LAB — BRAIN NATRIURETIC PEPTIDE: B Natriuretic Peptide: 22.6 pg/mL (ref 0.0–100.0)

## 2021-11-24 LAB — TRIGLYCERIDES: Triglycerides: 311 mg/dL — ABNORMAL HIGH (ref <150)

## 2021-11-24 LAB — PROCALCITONIN: Procalcitonin: 0.37 ng/mL

## 2021-11-24 LAB — CBG MONITORING, ED: Glucose-Capillary: 122 mg/dL — ABNORMAL HIGH (ref 70–99)

## 2021-11-24 LAB — LIPASE, BLOOD: Lipase: 240 U/L — ABNORMAL HIGH (ref 11–51)

## 2021-11-24 MED ORDER — MORPHINE SULFATE (PF) 2 MG/ML IV SOLN: 2.0000 mg | INTRAVENOUS | Status: DC | PRN

## 2021-11-24 MED ORDER — IOHEXOL 300 MG/ML  SOLN
100.0000 mL | Freq: Once | INTRAMUSCULAR | Status: AC | PRN
  Administered 2021-11-24: 100 mL via INTRAVENOUS

## 2021-11-24 MED ORDER — MELATONIN 5 MG PO TABS
10.0000 mg | ORAL_TABLET | Freq: Every evening | ORAL | Status: DC | PRN
  Administered 2021-11-24 – 2021-11-27 (×2): 10 mg via ORAL
  Filled 2021-11-24 (×2): qty 2

## 2021-11-24 MED ORDER — OMEGA-3-ACID ETHYL ESTERS 1 G PO CAPS
1.0000 g | ORAL_CAPSULE | Freq: Three times a day (TID) | ORAL | Status: DC
  Administered 2021-11-25 – 2021-11-28 (×11): 1 g via ORAL
  Filled 2021-11-24 (×11): qty 1

## 2021-11-24 MED ORDER — DIPHENHYDRAMINE HCL 50 MG/ML IJ SOLN: 25.0000 mg | Freq: Three times a day (TID) | INTRAMUSCULAR | Status: DC | PRN

## 2021-11-24 MED ORDER — INSULIN ASPART 100 UNIT/ML IJ SOLN
0.0000 [IU] | Freq: Every day | INTRAMUSCULAR | Status: DC
  Administered 2021-11-25: 3 [IU] via SUBCUTANEOUS
  Administered 2021-11-26 – 2021-11-27 (×2): 5 [IU] via SUBCUTANEOUS
  Filled 2021-11-24 (×3): qty 1

## 2021-11-24 MED ORDER — METOPROLOL TARTRATE 50 MG PO TABS
150.0000 mg | ORAL_TABLET | Freq: Two times a day (BID) | ORAL | Status: DC
  Administered 2021-11-24 – 2021-11-28 (×9): 150 mg via ORAL
  Filled 2021-11-24 (×10): qty 3

## 2021-11-24 MED ORDER — LORATADINE 10 MG PO TABS
10.0000 mg | ORAL_TABLET | Freq: Every day | ORAL | Status: DC
  Administered 2021-11-25 – 2021-11-28 (×4): 10 mg via ORAL
  Filled 2021-11-24 (×4): qty 1

## 2021-11-24 MED ORDER — NORTRIPTYLINE HCL 25 MG PO CAPS
50.0000 mg | ORAL_CAPSULE | Freq: Every day | ORAL | Status: DC
  Administered 2021-11-24 – 2021-11-27 (×4): 50 mg via ORAL
  Filled 2021-11-24 (×5): qty 2

## 2021-11-24 MED ORDER — BUPROPION HCL ER (XL) 150 MG PO TB24
150.0000 mg | ORAL_TABLET | Freq: Every day | ORAL | Status: DC
  Administered 2021-11-25 – 2021-11-28 (×4): 150 mg via ORAL
  Filled 2021-11-24 (×4): qty 1

## 2021-11-24 MED ORDER — ISOSORBIDE MONONITRATE ER 30 MG PO TB24
30.0000 mg | ORAL_TABLET | Freq: Every day | ORAL | Status: DC
  Administered 2021-11-24 – 2021-11-28 (×5): 30 mg via ORAL
  Filled 2021-11-24 (×5): qty 1

## 2021-11-24 MED ORDER — FAMOTIDINE 20 MG PO TABS
20.0000 mg | ORAL_TABLET | Freq: Every day | ORAL | Status: DC
  Administered 2021-11-25 – 2021-11-28 (×4): 20 mg via ORAL
  Filled 2021-11-24 (×4): qty 1

## 2021-11-24 MED ORDER — MIRTAZAPINE 15 MG PO TABS
45.0000 mg | ORAL_TABLET | Freq: Every day | ORAL | Status: DC
  Administered 2021-11-24 – 2021-11-27 (×4): 45 mg via ORAL
  Filled 2021-11-24 (×4): qty 3

## 2021-11-24 MED ORDER — ACETAMINOPHEN 325 MG PO TABS: 650.0000 mg | ORAL_TABLET | Freq: Four times a day (QID) | ORAL | Status: DC | PRN

## 2021-11-24 MED ORDER — INSULIN GLARGINE-YFGN 100 UNIT/ML ~~LOC~~ SOLN
70.0000 [IU] | Freq: Two times a day (BID) | SUBCUTANEOUS | Status: DC
  Administered 2021-11-24 – 2021-11-27 (×7): 70 [IU] via SUBCUTANEOUS
  Filled 2021-11-24 (×8): qty 0.7

## 2021-11-24 MED ORDER — SODIUM CHLORIDE 0.9 % IV BOLUS: 1500.0000 mL | Freq: Once | INTRAVENOUS | Status: DC

## 2021-11-24 MED ORDER — SODIUM CHLORIDE 0.9 % IV SOLN
1.0000 g | INTRAVENOUS | Status: DC
  Administered 2021-11-24: 1 g via INTRAVENOUS
  Filled 2021-11-24: qty 10

## 2021-11-24 MED ORDER — SODIUM CHLORIDE 0.9 % IV SOLN
1.0000 g | INTRAVENOUS | Status: DC
  Administered 2021-11-25: 1 g via INTRAVENOUS
  Filled 2021-11-24: qty 10
  Filled 2021-11-24: qty 1

## 2021-11-24 MED ORDER — DICLOFENAC SODIUM 1 % EX GEL: 2.0000 g | Freq: Four times a day (QID) | CUTANEOUS | Status: DC | PRN

## 2021-11-24 MED ORDER — NITROGLYCERIN 0.4 MG SL SUBL: 0.4000 mg | SUBLINGUAL_TABLET | SUBLINGUAL | Status: DC | PRN

## 2021-11-24 MED ORDER — BICTEGRAVIR-EMTRICITAB-TENOFOV 50-200-25 MG PO TABS
1.0000 | ORAL_TABLET | Freq: Every day | ORAL | Status: DC
  Administered 2021-11-25 – 2021-11-28 (×4): 1 via ORAL
  Filled 2021-11-24 (×5): qty 1

## 2021-11-24 MED ORDER — AMLODIPINE BESYLATE 10 MG PO TABS
10.0000 mg | ORAL_TABLET | Freq: Every day | ORAL | Status: DC
  Administered 2021-11-24 – 2021-11-28 (×5): 10 mg via ORAL
  Filled 2021-11-24 (×5): qty 1

## 2021-11-24 MED ORDER — DIPHENHYDRAMINE HCL 25 MG PO CAPS
50.0000 mg | ORAL_CAPSULE | Freq: Once | ORAL | Status: AC
Start: 2021-11-24 — End: 2021-11-24
  Administered 2021-11-24: 50 mg via ORAL
  Filled 2021-11-24: qty 2

## 2021-11-24 MED ORDER — ASPIRIN 81 MG PO TBEC
81.0000 mg | DELAYED_RELEASE_TABLET | Freq: Every day | ORAL | Status: DC
  Administered 2021-11-24 – 2021-11-28 (×5): 81 mg via ORAL
  Filled 2021-11-24 (×5): qty 1

## 2021-11-24 MED ORDER — ENOXAPARIN SODIUM 40 MG/0.4ML IJ SOSY
40.0000 mg | PREFILLED_SYRINGE | INTRAMUSCULAR | Status: DC
  Administered 2021-11-24 – 2021-11-27 (×4): 40 mg via SUBCUTANEOUS
  Filled 2021-11-24 (×4): qty 0.4

## 2021-11-24 MED ORDER — TIZANIDINE HCL 4 MG PO TABS
4.0000 mg | ORAL_TABLET | Freq: Three times a day (TID) | ORAL | Status: DC
  Administered 2021-11-24 – 2021-11-28 (×12): 4 mg via ORAL
  Filled 2021-11-24 (×14): qty 1

## 2021-11-24 MED ORDER — DICYCLOMINE HCL 10 MG PO CAPS
20.0000 mg | ORAL_CAPSULE | Freq: Two times a day (BID) | ORAL | Status: DC
  Administered 2021-11-25 – 2021-11-28 (×8): 20 mg via ORAL
  Filled 2021-11-24 (×9): qty 2

## 2021-11-24 MED ORDER — FINASTERIDE 5 MG PO TABS
5.0000 mg | ORAL_TABLET | Freq: Every day | ORAL | Status: DC
  Administered 2021-11-25 – 2021-11-28 (×4): 5 mg via ORAL
  Filled 2021-11-24 (×4): qty 1

## 2021-11-24 MED ORDER — ACYCLOVIR 200 MG PO CAPS
400.0000 mg | ORAL_CAPSULE | Freq: Two times a day (BID) | ORAL | Status: DC
  Administered 2021-11-24 – 2021-11-28 (×8): 400 mg via ORAL
  Filled 2021-11-24 (×9): qty 2

## 2021-11-24 MED ORDER — PANTOPRAZOLE SODIUM 40 MG PO TBEC
40.0000 mg | DELAYED_RELEASE_TABLET | Freq: Every day | ORAL | Status: DC
  Administered 2021-11-25 – 2021-11-28 (×4): 40 mg via ORAL
  Filled 2021-11-24 (×4): qty 1

## 2021-11-24 MED ORDER — GABAPENTIN 300 MG PO CAPS
300.0000 mg | ORAL_CAPSULE | Freq: Three times a day (TID) | ORAL | Status: DC
  Administered 2021-11-24 – 2021-11-28 (×13): 300 mg via ORAL
  Filled 2021-11-24 (×13): qty 1

## 2021-11-24 MED ORDER — FESOTERODINE FUMARATE ER 4 MG PO TB24
4.0000 mg | ORAL_TABLET | Freq: Every day | ORAL | Status: DC
  Administered 2021-11-25 – 2021-11-28 (×4): 4 mg via ORAL
  Filled 2021-11-24 (×4): qty 1

## 2021-11-24 MED ORDER — CITALOPRAM HYDROBROMIDE 10 MG PO TABS
20.0000 mg | ORAL_TABLET | Freq: Every day | ORAL | Status: DC
  Administered 2021-11-25 – 2021-11-28 (×4): 20 mg via ORAL
  Filled 2021-11-24 (×4): qty 2

## 2021-11-24 MED ORDER — ALBUTEROL SULFATE (2.5 MG/3ML) 0.083% IN NEBU: 3.0000 mL | INHALATION_SOLUTION | RESPIRATORY_TRACT | Status: DC | PRN

## 2021-11-24 MED ORDER — SODIUM CHLORIDE 0.9 % IV SOLN: INTRAVENOUS | Status: DC

## 2021-11-24 MED ORDER — ROSUVASTATIN CALCIUM 10 MG PO TABS
40.0000 mg | ORAL_TABLET | Freq: Every day | ORAL | Status: DC
  Administered 2021-11-25 – 2021-11-28 (×4): 40 mg via ORAL
  Filled 2021-11-24 (×4): qty 4

## 2021-11-24 MED ORDER — POTASSIUM CHLORIDE CRYS ER 20 MEQ PO TBCR
40.0000 meq | EXTENDED_RELEASE_TABLET | ORAL | Status: AC
  Administered 2021-11-24 (×3): 40 meq via ORAL
  Filled 2021-11-24 (×3): qty 2

## 2021-11-24 MED ORDER — METRONIDAZOLE 500 MG/100ML IV SOLN
500.0000 mg | Freq: Two times a day (BID) | INTRAVENOUS | Status: DC
Start: 2021-11-24 — End: 2021-11-26
  Administered 2021-11-24 – 2021-11-25 (×4): 500 mg via INTRAVENOUS
  Filled 2021-11-24 (×5): qty 100

## 2021-11-24 MED ORDER — ONDANSETRON HCL 4 MG/2ML IJ SOLN: 4.0000 mg | Freq: Three times a day (TID) | INTRAMUSCULAR | Status: DC | PRN

## 2021-11-24 MED ORDER — OXYCODONE-ACETAMINOPHEN 5-325 MG PO TABS: 1.0000 | ORAL_TABLET | ORAL | Status: DC | PRN

## 2021-11-24 MED ORDER — DM-GUAIFENESIN ER 30-600 MG PO TB12: 1.0000 | ORAL_TABLET | Freq: Two times a day (BID) | ORAL | Status: DC | PRN

## 2021-11-24 MED ORDER — POLYETHYLENE GLYCOL 3350 17 G PO PACK
17.0000 g | PACK | Freq: Three times a day (TID) | ORAL | Status: DC
  Administered 2021-11-24 – 2021-11-28 (×9): 17 g via ORAL
  Filled 2021-11-24 (×12): qty 1

## 2021-11-24 MED ORDER — FLUTICASONE PROPIONATE 50 MCG/ACT NA SUSP: 2.0000 | Freq: Every day | NASAL | Status: DC | PRN

## 2021-11-24 MED ORDER — INSULIN ASPART 100 UNIT/ML IJ SOLN
0.0000 [IU] | Freq: Three times a day (TID) | INTRAMUSCULAR | Status: DC
  Administered 2021-11-24: 3 [IU] via SUBCUTANEOUS
  Administered 2021-11-24: 1 [IU] via SUBCUTANEOUS
  Administered 2021-11-25 (×2): 3 [IU] via SUBCUTANEOUS
  Administered 2021-11-26: 2 [IU] via SUBCUTANEOUS
  Administered 2021-11-26: 1 [IU] via SUBCUTANEOUS
  Administered 2021-11-26: 5 [IU] via SUBCUTANEOUS
  Administered 2021-11-27: 9 [IU] via SUBCUTANEOUS
  Administered 2021-11-27: 3 [IU] via SUBCUTANEOUS
  Administered 2021-11-28: 9 [IU] via SUBCUTANEOUS
  Administered 2021-11-28: 6 [IU] via SUBCUTANEOUS
  Administered 2021-11-28: 3 [IU] via SUBCUTANEOUS
  Filled 2021-11-24 (×12): qty 1

## 2021-11-24 MED ORDER — HYDRALAZINE HCL 20 MG/ML IJ SOLN: 5.0000 mg | INTRAMUSCULAR | Status: DC | PRN

## 2021-11-24 MED ORDER — ADULT MULTIVITAMIN W/MINERALS CH: 1.0000 | ORAL_TABLET | Freq: Every day | ORAL | Status: DC

## 2021-11-24 MED ORDER — NAPROXEN 500 MG PO TABS
500.0000 mg | ORAL_TABLET | Freq: Two times a day (BID) | ORAL | Status: DC
  Administered 2021-11-24 – 2021-11-28 (×8): 500 mg via ORAL
  Filled 2021-11-24 (×9): qty 1

## 2021-11-24 MED ORDER — NICOTINE 21 MG/24HR TD PT24
21.0000 mg | MEDICATED_PATCH | Freq: Every day | TRANSDERMAL | Status: DC
  Filled 2021-11-24 (×4): qty 1

## 2021-11-24 MED ORDER — IPRATROPIUM BROMIDE 0.06 % NA SOLN: 2.0000 | Freq: Four times a day (QID) | NASAL | Status: DC

## 2021-11-24 NOTE — ED Triage Notes (Signed)
First Nurse Note:  Arrives via [facility] from home.  C/O suprapubic, lower abdominal pain.  Onset this am.  Describes pain as sharp stabbing pain.  Originally more to right side, now across lower abdominal.  More pain after urination.  VS wnl per EMS report.

## 2021-11-24 NOTE — Progress Notes (Signed)
Patient brought medications from home. All meds send to the pharmacy.

## 2021-11-24 NOTE — ED Provider Notes (Signed)
[facility] Provider Note    Event Date/Time   First MD Initiated Contact with Patient 11/24/21 [phone]     (approximate)   History   Abdominal Pain   HPI  Cory Campbell is a 41 y.o. male  who presents to the emergency department today because of concern for abdominal pain. Located across his lower abdomen. Pain started a few days ago when he was urinating. The patient felt a pop and then the pain started. Again this morning when he was urinating felt a pop in his lower abdomen. The patient denies any bloody urine or painful urination. Denies any change in defecation.     Physical Exam   Triage Vital Signs: ED Triage Vitals  Enc Vitals Group     BP 11/24/21 0718 (!) 175/115     Pulse Rate 11/24/21 0718 80     Resp 11/24/21 0718 18     Temp 11/24/21 0718 97.7 F (36.5 C)     Temp Source 11/24/21 0718 Oral     SpO2 11/24/21 0718 100 %     Weight 11/24/21 0714 173 lb (78.5 kg)     Height 11/24/21 0714 5\' 6"  (1.676 m)     Head Circumference --      Peak Flow --      Pain Score 11/24/21 0714 10     Pain Loc --      Pain Edu? --      Excl. in [place]? --     Most recent vital signs: Vitals:   11/24/21 0718  BP: (!) 175/115  Pulse: 80  Resp: 18  Temp: 97.7 F (36.5 C)  SpO2: 100%    General: Awake, alert, oriented. CV:  Good peripheral perfusion. Regular rate and rhythm. Resp:  Normal effort. Lungs clear. Abd:  No distention. Tender to palpation in the lower abdomen.    ED Results / Procedures / Treatments   Labs (all labs ordered are listed, but only abnormal results are displayed) Labs Reviewed  LIPASE, BLOOD - Abnormal; Notable for the following components:      Result Value   Lipase 240 (*)    All other components within normal limits  COMPREHENSIVE METABOLIC PANEL - Abnormal; Notable for the following components:   Sodium 146 (*)    Potassium 2.4 (*)    Glucose, Bld 169 (*)    Alkaline Phosphatase 127 (*)    All other  components within normal limits  CBC - Abnormal; Notable for the following components:   RBC 5.84 (*)    HCT 52.3 (*)    All other components within normal limits  URINALYSIS, ROUTINE W REFLEX MICROSCOPIC - Abnormal; Notable for the following components:   Color, Urine YELLOW (*)    APPearance HAZY (*)    Glucose, UA >=500 (*)    Hgb urine dipstick SMALL (*)    Ketones, ur 5 (*)    Protein, ur 100 (*)    Leukocytes,Ua TRACE (*)    WBC, UA >50 (*)    All other components within normal limits  URINE CULTURE  MAGNESIUM     EKG  None   RADIOLOGY I independently interpreted and visualized the CT abd/pel. My interpretation: Thickened bladder wall, No free air Radiology interpretation:  IMPRESSION:  1. Diffuse thickening of the urinary bladder may be increased  compared to previous imaging and is associated with mild perivesical  stranding. Findings are compatible with cystitis with interval  worsening, correlate with urinalysis.  2. Thickening of the low rectum and prominence of the sphincter  complex. Correlate with any signs of proctitis or recent fecal  impaction. Given the eccentric nature of thickening favoring RIGHT  lateral rectal wall would also consider correlation with direct  visualization on follow-up to exclude underlying lesion in this  location.  3. Mild stranding of and or thickening of soft tissues in the medial  gluteal fold and overlying the inferior aspects of the ischia.  Correlate with any developing decubitus changes.  4. Hepatic steatosis.  5. 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  6. Thickening of the RIGHT lower quadrant abdominal wall likely  reflecting site of medication injection is unchanged.    PROCEDURES:  Critical Care performed: No  Procedures   MEDICATIONS ORDERED IN ED: Medications - No data to display   IMPRESSION / MDM / ASSESSMENT AND PLAN / ED COURSE  I reviewed the triage vital signs  and the nursing notes.                              Differential diagnosis includes, but is not limited to, diverticulitis, perforation, SBO, UTI.  Patient's presentation is most consistent with acute presentation with potential threat to life or bodily function.  Patient presented to the emergency department today because of concerns for suprapubic pain.  Pain started about 5 days ago when the patient felt a "pop".  On exam patient with mild tenderness to palpation of the suprapubic region.  Blood work here shows hypokalemia.  Also slightly elevated lipase.  No leukocytosis.  Urinalysis is concerning with infection.  Did obtain a CT scan which is consistent with cystitis.  Given urinary tract affection as well as hyperkalemia I do feel patient would benefit from further work-up and management. Discussed with Dr. Clyde Lundborg with the hospitalist service who will plan on admission.  FINAL CLINICAL IMPRESSION(S) / ED DIAGNOSES   Final diagnoses:  Lower urinary tract infectious disease  Hypokalemia     Note:  This document was prepared using Dragon voice recognition software and may include unintentional dictation errors.    Phineas Semen, MD 11/24/21 [phone]

## 2021-11-24 NOTE — ED Notes (Signed)
Critical lab: Lactic Acid - 3.0 

## 2021-11-24 NOTE — ED Notes (Signed)
Report given to John RN.

## 2021-11-24 NOTE — ED Triage Notes (Signed)
Arrived by EMS from home for abdominal pain. Reports X5 days. Describes sharp and stabbing. Started on right side and now radiates to left.

## 2021-11-24 NOTE — ED Notes (Signed)
Pt c/o itching all over. Pt scratching abd, BLE, chest. No hives noted but skin looks red. No airway swelling noted. Per pt, he is not allergic to IV contract. There have been no other meds given. EDP notified. See MAR for meds.

## 2021-11-24 NOTE — H&P (Signed)
History and Physical    Cory Campbell WUJ:[id number] DOB: [birth year] DOA: 11/24/2021  Referring MD/NP/PA:   PCP: Donnie Coffin, MD   Patient coming from:  The patient is coming from home.  At baseline, pt is independent for most of ADL.        Chief Complaint: abdominal pain and dysuria  HPI: Cory Campbell is a 41 y.o. male with medical history significant of s/p of partial colectomy, diverticulitis, s/p of corneal transplantation, arthrogryposis multiplex congenita, wheelchair-bound, hypertension, hyperlipidemia, diabetes mellitus, asthma, hypothyroidism, depression with anxiety, remote seizure not on any medication, HIV, chronic pain syndrome.  BPH, cCHF, who presents with abdominal pain and dysuria.  Patient states that he has abdominal pain for past 5 days, which has been persistent.  It is located in suprapubic area, constant, sharp, moderate to severe, nonradiating.  Patient has nausea, no vomiting or diarrhea.  No diarrhea or rectal bleeding.  No fever or chills.  Patient also reports dysuria and increased urinary frequency, no burning on urination or hematuria.  No chest pain, cough, shortness of breath.  Data reviewed independently and ED Course: pt was found to have WBC 5.7, lipase 240, BNP 22, positive urinalysis (hazy appearance, trace amount of leukocyte, negative bacteria, WBC> 50), GFR> 60, temperature 97.3, blood pressure 179/110 8, heart rate 106, RR 18, oxygen saturation 100% on room air.  Patient is admitted to telemetry bed as inpatient.    CTA-abd/pelvis 1. Diffuse thickening of the urinary bladder may be increased compared to previous imaging and is associated with mild perivesical stranding. Findings are compatible with cystitis with interval worsening, correlate with urinalysis. 2. Thickening of the low rectum and prominence of the sphincter complex. Correlate with any signs of proctitis or recent fecal impaction. Given the eccentric nature of thickening  favoring RIGHT lateral rectal wall would also consider correlation with direct visualization on follow-up to exclude underlying lesion in this location. 3. Mild stranding of and or thickening of soft tissues in the medial gluteal fold and overlying the inferior aspects of the ischia. Correlate with any developing decubitus changes. 4. Hepatic steatosis. 5. 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 6. Thickening of the RIGHT lower quadrant abdominal wall likely reflecting site of medication injection is unchanged. 7. Pancreas: Normal, without mass, inflammation or ductal dilatation.  EKG: not done in ED, will get one.      Review of Systems:   General: no fevers, chills, no body weight gain, has fatigue HEENT: no blurry vision, hearing changes or sore throat Respiratory: no dyspnea, coughing, wheezing CV: no chest pain, no palpitations GI: has nausea, lower abdominal pain, no diarrhea, constipation, vomiting GU: has dysuria, increased urinary frequency, no hematuria, burning on urination,  Ext: no leg edema Neuro: no unilateral numbness, no vision change or hearing loss Skin: no rash, no skin tear. MSK: No muscle spasm, no deformity, no limitation of range of movement in spin.  Has muscular dystrophy both legs Heme: No easy bruising.  Travel history: No recent long distant travel.   Allergy:  Allergies  Allergen Reactions   Penicillins Hives, Itching and Rash    Has patient had a PCN reaction causing immediate rash, facial/tongue/throat swelling, SOB or lightheadedness with hypotension: Yes Has patient had a PCN reaction causing severe rash involving mucus membranes or skin necrosis: No Has patient had a PCN reaction that required hospitalization No Has patient had a PCN reaction occurring within the last 10 years: No If all  of the above answers are "NO", then may proceed with Cephalosporin use.    Erythromycin Base Itching and Rash     Past Medical History:  Diagnosis Date   (HFpEF) heart failure with preserved ejection fraction (HCC)    Acid reflux    Adopted    Anxiety    Arthritis    Arthrogryposis    Asthma    BPH (benign prostatic hyperplasia)    Bronchitis    Chronic pain syndrome    Depressive disorder, not elsewhere classified    Diabetes mellitus without complication ([place])    Diverticulitis of colon (without mention of hemorrhage)(562.11)    ED (erectile dysfunction)    Herpes genitalis    HIV infection ([place])    HTN (hypertension)    Hyperlipidemia    Hypertensive cardiomyopathy ([name])    Hypogonadism in male    OAB (overactive bladder)    Other psoriasis    Seizure disorder (HCC)    Sleep apnea    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 or other cause, without mention of thyrotoxic crisis or storm    hyperthyroidism    Past Surgical History:  Procedure Laterality Date   APPENDECTOMY     COLOSTOMY     CORNEAL TRANSPLANT     feet surgery     both   HAND SURGERY     left and right   leg surgery     left and right   ORTHOPEDIC SURGERY     hands, feet, knees, legs   tubes in ears     both    Social History:  reports that he has been smoking pipe, cigars, and e-cigarettes. He has never used smokeless tobacco. He reports that he does not currently use alcohol after a past usage of about 3.0 standard drinks of alcohol per week. He reports that he does not use drugs.  Family History:  Family History  Adopted: Yes  Family history unknown: Yes     Prior to Admission medications   Medication Sig Start Date End Date Taking? Authorizing Provider  acyclovir (ZOVIRAX) 400 MG tablet Take 400 mg by mouth 2 (two) times daily.    [provider]  albuterol (PROVENTIL HFA;VENTOLIN HFA) 108 (90 BASE) MCG/ACT inhaler Inhale 2 puffs into the lungs every 4 (four) hours as needed for wheezing or shortness of breath.     [provider]  albuterol (PROVENTIL) (2.5 MG/3ML) 0.083%  nebulizer solution Take 2.5 mg by nebulization every 4 (four) hours as needed for wheezing or shortness of breath.    [provider]  amLODipine (NORVASC) 10 MG tablet Take 10 mg by mouth daily.    [provider]  aspirin EC 81 MG tablet Take 81 mg by mouth daily. Swallow whole.    [provider]  BIKTARVY 50-200-25 MG TABS tablet Take 1 tablet by mouth daily. 11/21/16   [provider]  blood glucose meter kit and supplies Use to check blood glucose five times per day. DX: E11.65 09/21/21 09/21/22  [provider]  buprenorphine (BUTRANS) 7.5 MCG/HR Place 7.5 mg onto the skin once a week. Saturday    [provider]  buPROPion (WELLBUTRIN XL) 150 MG 24 hr tablet Take 150 mg by mouth daily. 08/11/21   [provider]  cetirizine (ZYRTEC) 10 MG tablet Take 10 mg by mouth daily.    [provider]  citalopram (CELEXA) 20 MG tablet Take 20 mg by mouth daily.    [provider]  cloNIDine (CATAPRES - DOSED IN MG/24 HR) 0.3 mg/24hr patch 0.3 mg once a week. 06/13/21   [provider]  Continuous Blood Gluc Sensor (DEXCOM G7 SENSOR) MISC Use every 10 days. 09/21/21   [provider]  diclofenac sodium (VOLTAREN) 1 % GEL Apply 2-4 g topically 4 (four) times daily as needed. For pain.    [provider]  dicyclomine (BENTYL) 10 MG capsule Take 20 mg by mouth 2 (two) times daily before a meal. 05/01/21   [provider]  Elastic Bandages & Supports (T.E.D. BELOW KNEE/MEDIUM) MISC 1 each by Does not apply route daily. Measure legs and give appropriate size. 09/12/21   Debbe Odea, MD  empagliflozin (JARDIANCE) 25 MG TABS tablet Take 25 mg by mouth daily. 01/19/20   [provider]  famotidine (PEPCID) 20 MG tablet Take 20 mg by mouth daily.  01/03/16   [provider]  feeding supplement, GLUCERNA SHAKE, (GLUCERNA SHAKE) LIQD Take 237 mLs by mouth 4 (four) times daily.    [provider]  finasteride (PROSCAR) 5 MG tablet Take 5 mg by mouth daily.    [provider]  fluticasone (FLONASE) 50 MCG/ACT nasal spray Place 2 sprays into both nostrils daily.    [provider]  fluticasone (FLOVENT HFA) 110 MCG/ACT inhaler Inhale 2 puffs into the lungs 2 (two) times daily.    [provider]  gabapentin (NEURONTIN) 300 MG capsule Take 1 capsule (300 mg total) by mouth 2 (two) times daily. 09/12/21 11/11/21  Debbe Odea, MD  gabapentin (NEURONTIN) 300 MG capsule Take by mouth. 10/03/21   [provider]  glucose 4 GM chewable tablet Chew 1 tablet by mouth as needed for low blood sugar.    [provider]  insulin degludec (TRESIBA) 100 UNIT/ML FlexTouch Pen Inject 100 Units into the skin 2 (two) times daily.    [provider]  ipratropium (ATROVENT) 0.06 % nasal spray Place 2 sprays into both nostrils 4 (four) times daily. 08/29/21   Margarette Canada, NP  isosorbide mononitrate (IMDUR) 30 MG 24 hr tablet Take 30 mg by mouth daily. 07/01/21   [provider]  ketoconazole (NIZORAL) 2 % shampoo Apply 1 application topically 2 (two) times a week. tues and thursday 08/27/14   [provider]  lidocaine (LIDODERM) 5 % Place onto the skin. 07/13/21   [provider]  lisinopril (ZESTRIL) 40 MG tablet Take 40 mg by mouth daily.    [provider]  Melatonin 5 MG TABS Take 10 mg by mouth at bedtime as needed (sleep).    [provider]  metFORMIN (GLUCOPHAGE-XR) 500 MG 24 hr tablet Take 1,000 mg by mouth 2 (two) times daily.    [provider]  metoprolol tartrate (LOPRESSOR) 100 MG tablet TAKE 1 AND 1/2 TABLETS(150 MG) BY MOUTH TWICE DAILY 10/31/21   Minna Merritts, MD  mirtazapine (REMERON) 45 MG tablet Take 45 mg by mouth at bedtime.    [provider]  Multiple Vitamin (MULTIVITAMIN) tablet Take 1 tablet by mouth daily.    [provider]  naproxen (NAPROSYN) 500  MG tablet Take 500 mg by mouth 2 (two) times daily. 09/08/21   [provider]  nitroGLYCERIN (NITROSTAT) 0.4 MG SL tablet PLACE 1 TABLET SUBLINGUALLY EVERY 5 MINUTES AS NEEDED FOR CHEST PAIN. CALL 911 IF YOU HAVE TAKEN 3 NITRO FOR CHEST PAIN 09/21/21   Minna Merritts, MD  nortriptyline (PAMELOR) 25 MG capsule Take 50 mg  by mouth at bedtime.    [provider]  omega-3 acid ethyl esters (LOVAZA) 1 g capsule Take 1 g by mouth 3 (three) times daily.    [provider]  omeprazole (PRILOSEC) 20 MG capsule Take 20 mg by mouth daily. 09/08/21   [provider]  ondansetron (ZOFRAN-ODT) 4 MG disintegrating tablet Take 4 mg by mouth every 8 (eight) hours as needed for nausea or vomiting.    [provider]  polyethylene glycol (MIRALAX / GLYCOLAX) 17 g packet Take 17 g by mouth 3 (three) times daily.    [provider]  prednisoLONE acetate (PRED FORTE) 1 % ophthalmic suspension Place 1 drop into the right eye daily.     [provider]  rosuvastatin (CRESTOR) 40 MG tablet TAKE 1 TABLET(40 MG) BY MOUTH DAILY 04/30/19   Minna Merritts, MD  solifenacin (VESICARE) 10 MG tablet Take 10 mg by mouth daily.    [provider]  Spacer/Aero-Holding Chambers (AEROCHAMBER PLUS) inhaler Use as instructed 03/23/18   Melynda Ripple, MD  spironolactone (ALDACTONE) 50 MG tablet Take 100 mg by mouth daily. 06/15/21   [provider]  tiZANidine (ZANAFLEX) 4 MG tablet Take 1 tablet (4 mg total) by mouth 3 (three) times daily. 07/06/21 07/06/22  Teodoro Spray, PA  TRULICITY 1.5 EK/8.0KL SOPN Inject 1.5 mg into the skin once a week. 01/14/20   [provider]  TRULICITY 4.5 KJ/1.7HX SOPN SMARTSIG:4.5 Milligram(s) SUB-Q Once a Week 09/23/21   [provider]    Physical Exam: Vitals:   11/24/21 1008 11/24/21 1030 11/24/21 1122 11/24/21 1124  BP:  (!) 152/94  (!) 179/128  Pulse:  (!) 106 70 73  Resp:   18   Temp: 97.7 F  (36.5 C)  (!) 97.3 F (36.3 C)   TempSrc: Oral  Axillary   SpO2:  100%  97%  Weight:      Height:       General: Not in acute distress.  Dry mucous membranes HEENT:       Eyes: PERRL, EOMI, no scleral icterus.       ENT: No discharge from the ears and nose, no pharynx injection, no tonsillar enlargement.        Neck: No JVD, no bruit, no mass felt. Heme: No neck lymph node enlargement. Cardiac: S1/S2, RRR, No murmurs, No gallops or rubs. Respiratory: No rales, wheezing, rhonchi or rubs. GI: Soft, has mild tenderness in lower abdomen, no rebound pain, no organomegaly, BS present. Has a large surgical scar in ventral abdomen GU: No hematuria Ext: No pitting leg edema bilaterally. 1+DP/PT pulse bilaterally. Musculoskeletal: No joint deformities, No joint redness or warmth.  Has severe muscular dystrophy in both legs Skin: No rashes.  Neuro: Alert, oriented X3, cranial nerves II-XII grossly intact, moves all extremities. Psych: Patient is not psychotic, no suicidal or hemocidal ideation.  Labs on Admission: I have personally reviewed following labs and imaging studies  CBC: Recent Labs  Lab 11/24/21 0717  WBC 5.7  HGB 16.6  HCT 52.3*  MCV 89.6  PLT 505   Basic Metabolic Panel: Recent Labs  Lab 11/24/21 0717  NA 146*  K 2.4*  CL 104  CO2 32  GLUCOSE 169*  BUN 13  CREATININE 1.16  CALCIUM 10.2  MG 2.4   GFR: Estimated Creatinine Clearance: 81.8 mL/min (by C-G formula based on SCr of 1.16 mg/dL). Liver Function Tests: Recent Labs  Lab 11/24/21 0717  AST 28  ALT  25  ALKPHOS 127*  BILITOT 0.9  PROT 7.8  ALBUMIN 4.3   Recent Labs  Lab 11/24/21 0717  LIPASE 240*   No results for input(s): "AMMONIA" in the last 168 hours. Coagulation Profile: No results for input(s): "INR", "PROTIME" in the last 168 hours. Cardiac Enzymes: No results for input(s): "CKTOTAL", "CKMB", "CKMBINDEX", "TROPONINI" in the last 168 hours. BNP (last 3 results) No results for  input(s): "PROBNP" in the last 8760 hours. HbA1C: No results for input(s): "HGBA1C" in the last 72 hours. CBG: Recent Labs  Lab 11/24/21 1315  GLUCAP 122*   Lipid Profile: Recent Labs    11/24/21 0717  TRIG 311*   Thyroid Function Tests: No results for input(s): "TSH", "T4TOTAL", "FREET4", "T3FREE", "THYROIDAB" in the last 72 hours. Anemia Panel: No results for input(s): "VITAMINB12", "FOLATE", "FERRITIN", "TIBC", "IRON", "RETICCTPCT" in the last 72 hours. Urine analysis:    Component Value Date/Time   COLORURINE YELLOW (A) 11/24/2021 0717   APPEARANCEUR HAZY (A) 11/24/2021 0717   APPEARANCEUR Cloudy 05/16/2014 0350   LABSPEC 1.030 11/24/2021 0717   LABSPEC 1.018 05/16/2014 0350   PHURINE 6.0 11/24/2021 0717   GLUCOSEU >=500 (A) 11/24/2021 0717   GLUCOSEU Negative 05/16/2014 0350   HGBUR SMALL (A) 11/24/2021 0717   BILIRUBINUR NEGATIVE 11/24/2021 0717   BILIRUBINUR Negative 05/16/2014 0350   KETONESUR 5 (A) 11/24/2021 0717   PROTEINUR 100 (A) 11/24/2021 0717   NITRITE NEGATIVE 11/24/2021 0717   LEUKOCYTESUR TRACE (A) 11/24/2021 0717   LEUKOCYTESUR 1+ 05/16/2014 0350   Sepsis Labs: $[email or web address]'[id number]$ (procalcitonin:4,lacticidven:4) )No results found for this or any previous visit (from the past 240 hour(s)).   Radiological Exams on Admission: CT ABDOMEN PELVIS W CONTRAST  Result Date: 11/24/2021 CLINICAL DATA:  A 41 year old male presents for evaluation of suprapubic and lower abdominal pain that began this morning. EXAM: CT ABDOMEN AND PELVIS WITH CONTRAST TECHNIQUE: Multidetector CT imaging of the abdomen and pelvis was performed using the standard protocol following bolus administration of intravenous contrast. RADIATION DOSE REDUCTION: This exam was performed according to the departmental dose-optimization program which includes automated exposure control, adjustment of the mA and/or kV according to patient size and/or use of iterative reconstruction technique. CONTRAST:   179mL OMNIPAQUE IOHEXOL 300 MG/ML  SOLN COMPARISON:  September 25, 2021 CT of the chest, abdomen and pelvis FINDINGS: Lower chest: Unremarkable to the extent evaluated. Hepatobiliary: Hepatic steatosis which is at least moderate and generalized throughout the liver. Portal vein is patent. No focal, suspicious hepatic lesion. No pericholecystic stranding. No biliary duct distension. Pancreas: Normal, without mass, inflammation or ductal dilatation. Spleen: Normal. Adrenals/Urinary Tract: Adrenal glands are unremarkable. Symmetric renal enhancement. No sign of hydronephrosis. No suspicious renal lesion or perinephric stranding. Diffuse thickening of the urinary bladder may be increased compared to previous imaging and is associated with mild perivesical stranding no ureteral dilation or ureteral calculi. Stomach/Bowel: Thickening of the low rectum and prominence of the sphincter complex. Liquid stool throughout much of the colon. No pericolonic stranding. No acute small bowel or gastric process. Vascular/Lymphatic: Aorta with smooth contours. IVC with smooth contours. No aneurysmal dilation of the abdominal aorta. There is no gastrohepatic or hepatoduodenal ligament lymphadenopathy. No retroperitoneal or mesenteric lymphadenopathy. No pelvic sidewall lymphadenopathy. Reproductive: Heterogeneity of the prostate is nonspecific. Visualized reproductive structures otherwise unremarkable on CT. Other: Mild stranding about the medial gluteal regions and in the soft tissues overlying the inferior ischia. 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 Abdominal wall soft tissue  thickening is similar to prior imaging in the RIGHT lower quadrant. No ascites. Musculoskeletal: Skeletal deformity as discussed. No acute skeletal process. IMPRESSION: 1. Diffuse thickening of the urinary bladder may be increased compared to previous imaging and is associated with mild perivesical stranding.  Findings are compatible with cystitis with interval worsening, correlate with urinalysis. 2. Thickening of the low rectum and prominence of the sphincter complex. Correlate with any signs of proctitis or recent fecal impaction. Given the eccentric nature of thickening favoring RIGHT lateral rectal wall would also consider correlation with direct visualization on follow-up to exclude underlying lesion in this location. 3. Mild stranding of and or thickening of soft tissues in the medial gluteal fold and overlying the inferior aspects of the ischia. Correlate with any developing decubitus changes. 4. Hepatic steatosis. 5. 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 6. Thickening of the RIGHT lower quadrant abdominal wall likely reflecting site of medication injection is unchanged. Electronically Signed   By: Zetta Bills M.D.   On: 11/24/2021 10:40      Assessment/Plan Principal Problem:   Abdominal pain Active Problems:   UTI (urinary tract infection)   Proctitis   Pancreatitis   Chronic pain syndrome   Hypokalemia   Asthma   HIV (human immunodeficiency virus infection) (HCC)   BPH (benign prostatic hyperplasia)   Essential hypertension   Chronic diastolic CHF (congestive heart failure) (HCC)   Elevated lactic acid level   Diabetes mellitus without complication (HCC)   Tobacco dependence   Depression with anxiety   Overweight (BMI 25.0-29.9)   Assessment and Plan: No notes have been filed under this hospital service. Service: Hospitalist    Abdominal pain: Likely multifactorial etiology, including UTI, possible mild pancreatitis, and possible proctitis.  Patient does not meet criteria for sepsis.  -Admitted to telemetry bed as inpatient -Antibiotics: Rocephin and Flagyl -Blood culture and urine culture -IV fluid: 1.5 L normal saline, then 75 cc/h -Pain control: As needed Tylenol, patient is on weekly buprenorphine patch -As needed Zofran for  nausea  UTI (urinary tract infection) -On Rocephin -Follow-up blood culture and urine culture  Possible proctitis: Patient does not have diarrhea. -On Rocephin and Flagyl as above  Possible mild pancreatitis: Lipase 240, liver function normal, bilirubin normal.  CT scan showed normal pancreatitis.  Patient may have mild pancreatitis. -Clear liquid diet -IV fluid as above -Check triglyceride level -Hold lisinopril  Chronic pain syndrome -Continue home buprenorphine patch -As needed Tylenol  Hypokalemia: Potassium 2.4, magnesium 2.4 -Repleted potassium  Asthma -Bronchodilators  HIV (human immunodeficiency virus infection) ([place]): CD4 232 on 05/16/2017, viral load negative on 04/18/2021 -Biktarvy  BPH (benign prostatic hyperplasia) -Proscar  Essential hypertension -IV hydralazine as needed -Amlodipine, metoprolol -Hold lisinopril due to pancreatitis -Imdur -Patient is on weekly clonidine patch  Chronic diastolic CHF (congestive heart failure) ([place]): 2D echo 10/08/2016 showed EF of 50-55%.  BNP normal 22.  Patient is clinically dry on admission -Hold spironolactone  Elevated lactic acid level: 3.0.  Patient does not meets criteria for sepsis.  Elevated lactic acid likely due to dehydration. -IV fluid as above -Trend lactic acid level  Diabetes mellitus without complication (HCC) -Recent A1c 7.8, poorly controlled.  Patient taking metformin, Trulicity, Jardiance, Tresiba 100 unit twice daily -Sliding scale insulin -Blood insulin 70 units twice daily  Tobacco dependence -Nicotine patch  Depression with anxiety -Continue Home medications  Overweight (BMI 25.0-29.9) BMI= 27.92  and BW= 78.5 kg -Diet and exercise.   -Encourage to  lose weight.       DVT ppx:  SQ Lovenox  Code Status: Full code  Family Communication:  Yes, patient's by phone  Disposition Plan:  Anticipate discharge back to previous environment  Consults called:  none  Admission status and  Level of care: Telemetry Medical:   as inpt        Dispo: The patient is from: Home              Anticipated d/c is to: Home              Anticipated d/c date is: 2 days              Patient currently is not medically stable to d/c.    Severity of Illness:  The appropriate patient status for this patient is INPATIENT. Inpatient status is judged to be reasonable and necessary in order to provide the required intensity of service to ensure the patient's safety. The patient's presenting symptoms, physical exam findings, and initial radiographic and laboratory data in the context of their chronic comorbidities is felt to place them at high risk for further clinical deterioration. Furthermore, it is not anticipated that the patient will be medically stable for discharge from the hospital within 2 midnights of admission.   * I certify that at the point of admission it is my clinical judgment that the patient will require inpatient hospital care spanning beyond 2 midnights from the point of admission due to high intensity of service, high risk for further deterioration and high frequency of surveillance required.*       Date of Service 11/24/2021    Ivor Costa [facility]   If 7PM-7AM, please contact night-coverage [email or web address] 11/24/2021, 3:44 PM

## 2021-11-24 NOTE — ED Notes (Signed)
Patient requested and gave permission for this writer to call [place] [employer] at [phone] to verify that he was here in order for his wife to pick up his phone that they have. Information was given to April Holding, Freight forwarder.

## 2021-11-25 DIAGNOSIS — E876 Hypokalemia: Secondary | ICD-10-CM

## 2021-11-25 DIAGNOSIS — N390 Urinary tract infection, site not specified: Secondary | ICD-10-CM

## 2021-11-25 DIAGNOSIS — R1010 Upper abdominal pain, unspecified: Secondary | ICD-10-CM | POA: Diagnosis not present

## 2021-11-25 LAB — CBC
HCT: 47.2 % (ref 39.0–52.0)
Hemoglobin: 15 g/dL (ref 13.0–17.0)
MCH: 28.4 pg (ref 26.0–34.0)
MCHC: 31.8 g/dL (ref 30.0–36.0)
MCV: 89.4 fL (ref 80.0–100.0)
Platelets: 236 10*3/uL (ref 150–400)
RBC: 5.28 MIL/uL (ref 4.22–5.81)
RDW: 13.5 % (ref 11.5–15.5)
WBC: 7.3 10*3/uL (ref 4.0–10.5)
nRBC: 0 % (ref 0.0–0.2)

## 2021-11-25 LAB — LIPASE, BLOOD: Lipase: 137 U/L — ABNORMAL HIGH (ref 11–51)

## 2021-11-25 LAB — GLUCOSE, CAPILLARY
Glucose-Capillary: 59 mg/dL — ABNORMAL LOW (ref 70–99)
Glucose-Capillary: 168 mg/dL — ABNORMAL HIGH (ref 70–99)
Glucose-Capillary: 246 mg/dL — ABNORMAL HIGH (ref 70–99)
Glucose-Capillary: 235 mg/dL — ABNORMAL HIGH (ref 70–99)
Glucose-Capillary: 272 mg/dL — ABNORMAL HIGH (ref 70–99)

## 2021-11-25 LAB — BASIC METABOLIC PANEL
Anion gap: 10 (ref 5–15)
BUN: 10 mg/dL (ref 6–20)
CO2: 29 mmol/L (ref 22–32)
Calcium: 8.5 mg/dL — ABNORMAL LOW (ref 8.9–10.3)
Chloride: 101 mmol/L (ref 98–111)
Creatinine, Ser: 0.87 mg/dL (ref 0.61–1.24)
GFR, Estimated: >60 mL/min (ref >60)
Glucose, Bld: 84 mg/dL (ref 70–99)
Potassium: 3 mmol/L — ABNORMAL LOW (ref 3.5–5.1)
Sodium: 140 mmol/L (ref 135–145)

## 2021-11-25 LAB — LACTIC ACID, PLASMA: Lactic Acid, Venous: 1.1 mmol/L (ref 0.5–1.9)

## 2021-11-25 MED ORDER — HYDRALAZINE HCL 20 MG/ML IJ SOLN
5.0000 mg | INTRAMUSCULAR | Status: DC | PRN
  Administered 2021-11-26 – 2021-11-27 (×2): 5 mg via INTRAVENOUS
  Filled 2021-11-25 (×2): qty 1

## 2021-11-25 MED ORDER — ADULT MULTIVITAMIN W/MINERALS CH
1.0000 | ORAL_TABLET | Freq: Every day | ORAL | Status: DC
  Administered 2021-11-25 – 2021-11-27 (×3): 1 via ORAL
  Filled 2021-11-25 (×3): qty 1

## 2021-11-25 MED ORDER — POTASSIUM CHLORIDE CRYS ER 20 MEQ PO TBCR
40.0000 meq | EXTENDED_RELEASE_TABLET | Freq: Two times a day (BID) | ORAL | Status: AC
  Administered 2021-11-25 (×2): 40 meq via ORAL
  Filled 2021-11-25 (×2): qty 2

## 2021-11-25 NOTE — Progress Notes (Signed)
PROGRESS NOTE    Cory Campbell  BJS:[id number] DOB: Jun 30, 1980 DOA: 11/24/2021 PCP: Donnie Coffin, MD   Assessment & Plan:   Principal Problem:   Abdominal pain Active Problems:   UTI (urinary tract infection)   Proctitis   Pancreatitis   Chronic pain syndrome   Hypokalemia   Asthma   HIV (human immunodeficiency virus infection) (HCC)   BPH (benign prostatic hyperplasia)   Essential hypertension   Chronic diastolic CHF (congestive heart failure) (HCC)   Elevated lactic acid level   Diabetes mellitus without complication (HCC)   Tobacco dependence   Depression with anxiety   Overweight (BMI 25.0-29.9)  Assessment and Plan: Abdominal pain: likely multifactorial etiology, including UTI, possible mild pancreatitis, and possible proctitis.  Patient does not meet criteria for sepsis. Continue on IV rocephin, flagyl. Blood cxs NGTD. Continue on IVFs. Zofran prn    UTI: urine cx is pending. Continue on IV rocephin    Possible proctitis: does not have diarrhea. Continue on IV rocephin, flagyl    Possible mild pancreatitis: lipase is elevated but trending down. Liver function normal, bilirubin normal.  CT scan showed normal pancreatitis.  Continue on IVFs    Chronic pain syndrome: continue home buprenorphine patch starting tomorrow  Hypokalemia: potassium given    Asthma: unknown stage and/or severity. Continue on bronchodilators    HIV: CD4 232 on 05/16/2017, viral load negative on 04/18/2021. Continue on home dose of biktarvy   BPH: continue on home dose of finasteride   HTN: continue on metoprolol, amlodipine, imdur. Holding lisinopril    Chronic diastolic CHF: echo 6/0/7371 showed EF of 50-55%.  BNP normal 22.  CHF appears compensated. Holding aldactone   Elevated lactic acid level: resolved  DM2: poorly controlled, HbA1c 7.8. Continue on glargine, SSI w/ accuchecks   Tobacco dependence: smoking cessation counseling x 5 mins. Nicotine patch to prevent w/drawl     Depression: severity unknown. Continue on home dose of bupropion, mirtazapine, nortriptyline    Overweight: would benefit from weight loss  Wheelchair bound: continue w/ supportive care.       DVT prophylaxis: lovenox  Code Status: full  Family Communication: discussed pt's care w/ pt's wife and answered her questions  Disposition Plan: likely d/c back home  Level of care: Telemetry Medical  Status is: Inpatient Remains inpatient appropriate because: severity of illness    Consultants:    Procedures:  Antimicrobials: rocephin, flagyl    Subjective: Pt c/o fatigue   Objective: Vitals:   11/24/21 1700 11/24/21 1832 11/24/21 2235 11/25/21 0013  BP: (!) 156/96 (!) 158/121  (!) 133/109  Pulse: 82 75  67  Resp: 17   17  Temp: 97.8 F (36.6 C) 98 F (36.7 C)  (!) 97.5 F (36.4 C)  TempSrc: Oral     SpO2: 100% 100% 99% 100%  Weight:      Height:        Intake/Output Summary (Last 24 hours) at 11/25/2021 0745 Last data filed at 11/25/2021 0531 Gross per 24 hour  Intake 1371.67 ml  Output 1150 ml  Net 221.67 ml   Filed Weights   11/24/21 0714  Weight: 78.5 kg    Examination:  General exam: Appears calm and comfortable  Respiratory system: Clear to auscultation. Respiratory effort normal. Cardiovascular system: S1 & S2 +. No  rubs, gallops or clicks.  Gastrointestinal system: Abdomen is nondistended, soft and nontender. Normal bowel sounds heard. Central nervous system: Alert and oriented. Psychiatry: Judgement and insight appear normal.  Flat mood and affect     Data Reviewed: I have personally reviewed following labs and imaging studies  CBC: Recent Labs  Lab 11/24/21 0717 11/25/21 0329  WBC 5.7 7.3  HGB 16.6 15.0  HCT 52.3* 47.2  MCV 89.6 89.4  PLT 259 [id number]   Basic Metabolic Panel: Recent Labs  Lab 11/24/21 0717 11/25/21 0329  NA 146* 140  K 2.4* 3.0*  CL 104 101  CO2 32 29  GLUCOSE 169* 84  BUN 13 10  CREATININE 1.16 0.87   CALCIUM 10.2 8.5*  MG 2.4  --    GFR: Estimated Creatinine Clearance: 109 mL/min (by C-G formula based on SCr of 0.87 mg/dL). Liver Function Tests: Recent Labs  Lab 11/24/21 0717  AST 28  ALT 25  ALKPHOS 127*  BILITOT 0.9  PROT 7.8  ALBUMIN 4.3   Recent Labs  Lab 11/24/21 0717 11/25/21 0329  LIPASE 240* 137*   No results for input(s): "AMMONIA" in the last 168 hours. Coagulation Profile: No results for input(s): "INR", "PROTIME" in the last 168 hours. Cardiac Enzymes: No results for input(s): "CKTOTAL", "CKMB", "CKMBINDEX", "TROPONINI" in the last 168 hours. BNP (last 3 results) No results for input(s): "PROBNP" in the last 8760 hours. HbA1C: No results for input(s): "HGBA1C" in the last 72 hours. CBG: Recent Labs  Lab 11/24/21 1315 11/24/21 1835 11/24/21 2030  GLUCAP 122* 211* 172*   Lipid Profile: Recent Labs    11/24/21 0717  TRIG 311*   Thyroid Function Tests: No results for input(s): "TSH", "T4TOTAL", "FREET4", "T3FREE", "THYROIDAB" in the last 72 hours. Anemia Panel: No results for input(s): "VITAMINB12", "FOLATE", "FERRITIN", "TIBC", "IRON", "RETICCTPCT" in the last 72 hours. Sepsis Labs: Recent Labs  Lab 11/24/21 1356 11/25/21 0620  PROCALCITON 0.37  --   LATICACIDVEN 3.0* 1.1    Recent Results (from the past 240 hour(s))  Culture, blood (x 2)     Status: None (Preliminary result)   Collection Time: 11/24/21  1:57 PM   Specimen: BLOOD  Result Value Ref Range Status   Specimen Description BLOOD LEFT ANTECUBITAL  Final   Special Requests   Final    BOTTLES DRAWN AEROBIC AND ANAEROBIC Blood Culture adequate volume   Culture   Final    NO GROWTH < 24 HOURS Performed at [facility], [address] [place], [place] [address]    Report Status PENDING  Incomplete  Culture, blood (x 2)     Status: None (Preliminary result)   Collection Time: 11/24/21  2:23 PM   Specimen: BLOOD  Result Value Ref Range Status   Specimen Description  BLOOD BLOOD RIGHT HAND  Final   Special Requests   Final    BOTTLES DRAWN AEROBIC AND ANAEROBIC Blood Culture adequate volume   Culture   Final    NO GROWTH < 24 HOURS Performed at [facility], [address], [place], [place] [address]    Report Status PENDING  Incomplete         Radiology Studies: CT ABDOMEN PELVIS W CONTRAST  Result Date: 11/24/2021 CLINICAL DATA:  A 41 year old male presents for evaluation of suprapubic and lower abdominal pain that began this morning. EXAM: CT ABDOMEN AND PELVIS WITH CONTRAST TECHNIQUE: Multidetector CT imaging of the abdomen and pelvis was performed using the standard protocol following bolus administration of intravenous contrast. RADIATION DOSE REDUCTION: This exam was performed according to the departmental dose-optimization program which includes automated exposure control, adjustment of the mA and/or kV according to  patient size and/or use of iterative reconstruction technique. CONTRAST:  122mL OMNIPAQUE IOHEXOL 300 MG/ML  SOLN COMPARISON:  September 25, 2021 CT of the chest, abdomen and pelvis FINDINGS: Lower chest: Unremarkable to the extent evaluated. Hepatobiliary: Hepatic steatosis which is at least moderate and generalized throughout the liver. Portal vein is patent. No focal, suspicious hepatic lesion. No pericholecystic stranding. No biliary duct distension. Pancreas: Normal, without mass, inflammation or ductal dilatation. Spleen: Normal. Adrenals/Urinary Tract: Adrenal glands are unremarkable. Symmetric renal enhancement. No sign of hydronephrosis. No suspicious renal lesion or perinephric stranding. Diffuse thickening of the urinary bladder may be increased compared to previous imaging and is associated with mild perivesical stranding no ureteral dilation or ureteral calculi. Stomach/Bowel: Thickening of the low rectum and prominence of the sphincter complex. Liquid stool throughout much of the colon. No pericolonic stranding. No acute  small bowel or gastric process. Vascular/Lymphatic: Aorta with smooth contours. IVC with smooth contours. No aneurysmal dilation of the abdominal aorta. There is no gastrohepatic or hepatoduodenal ligament lymphadenopathy. No retroperitoneal or mesenteric lymphadenopathy. No pelvic sidewall lymphadenopathy. Reproductive: Heterogeneity of the prostate is nonspecific. Visualized reproductive structures otherwise unremarkable on CT. Other: Mild stranding about the medial gluteal regions and in the soft tissues overlying the inferior ischia. 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 Abdominal wall soft tissue thickening is similar to prior imaging in the RIGHT lower quadrant. No ascites. Musculoskeletal: Skeletal deformity as discussed. No acute skeletal process. IMPRESSION: 1. Diffuse thickening of the urinary bladder may be increased compared to previous imaging and is associated with mild perivesical stranding. Findings are compatible with cystitis with interval worsening, correlate with urinalysis. 2. Thickening of the low rectum and prominence of the sphincter complex. Correlate with any signs of proctitis or recent fecal impaction. Given the eccentric nature of thickening favoring RIGHT lateral rectal wall would also consider correlation with direct visualization on follow-up to exclude underlying lesion in this location. 3. Mild stranding of and or thickening of soft tissues in the medial gluteal fold and overlying the inferior aspects of the ischia. Correlate with any developing decubitus changes. 4. Hepatic steatosis. 5. 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 6. Thickening of the RIGHT lower quadrant abdominal wall likely reflecting site of medication injection is unchanged. Electronically Signed   By: Zetta Bills M.D.   On: 11/24/2021 10:40        Scheduled Meds:  acyclovir  400 mg Oral BID   amLODipine  10 mg Oral  Daily   aspirin EC  81 mg Oral Daily   bictegravir-emtricitabine-tenofovir AF  1 tablet Oral Daily   buPROPion  150 mg Oral Daily   citalopram  20 mg Oral Daily   dicyclomine  20 mg Oral BID AC   enoxaparin (LOVENOX) injection  40 mg Subcutaneous Q24H   famotidine  20 mg Oral Daily   fesoterodine  4 mg Oral Daily   finasteride  5 mg Oral Daily   gabapentin  300 mg Oral TID   insulin aspart  0-5 Units Subcutaneous QHS   insulin aspart  0-9 Units Subcutaneous TID WC   insulin glargine-yfgn  70 Units Subcutaneous BID   isosorbide mononitrate  30 mg Oral Daily   loratadine  10 mg Oral Daily   metoprolol tartrate  150 mg Oral BID   mirtazapine  45 mg Oral QHS   multivitamin with minerals  1 tablet Oral Daily   naproxen  500 mg Oral  BID   nicotine  21 mg Transdermal Daily   nortriptyline  50 mg Oral QHS   omega-3 acid ethyl esters  1 g Oral TID   pantoprazole  40 mg Oral Daily   polyethylene glycol  17 g Oral TID   rosuvastatin  40 mg Oral Daily   tiZANidine  4 mg Oral TID   Continuous Infusions:  sodium chloride 75 mL/hr at 11/24/21 1843   cefTRIAXone (ROCEPHIN)  IV     metronidazole Stopped (11/25/21 0023)   sodium chloride       LOS: 1 day    Time spent: 35 mins     Wyvonnia Dusky, MD [facility] Pager [phone]  If 7PM-7AM, please contact night-coverage 11/25/2021, 7:45 AM

## 2021-11-25 NOTE — TOC Progression Note (Signed)
Transition of Care [facility]) - Progression Note    Patient Details  Name: Cory Campbell MRN: [id number] Date of Birth: 04-Mar-1980  Transition of Care [facility]) CM/SW [place], RN Phone Number: 11/25/2021, 9:54 AM  Clinical Narrative:      Transition of Care (TOC) Screening Note   Patient Details  Name: Cory Campbell Date of Birth: 1980/04/07   Transition of Care [facility]) CM/SW Contact:    Conception Oms, RN Phone Number: 11/25/2021, 9:54 AM    Transition of Care Department [facility]) has reviewed patient and no TOC needs have been identified at this time. We will continue to monitor patient advancement through interdisciplinary progression rounds. If new patient transition needs arise, please place a TOC consult.    Expected Discharge Plan: Home/Self Care Barriers to Discharge: Continued Medical Work up  Expected Discharge Plan and Services Expected Discharge Plan: Home/Self Care                                               Social Determinants of Health (SDOH) Interventions    Readmission Risk Interventions    09/11/2021   10:27 AM  Readmission Risk Prevention Plan  Transportation Screening Complete  PCP or Specialist Appt within 3-5 Days Complete  HRI or [place] Complete  Social Work Consult for [place] Planning/Counseling Complete  Palliative Care Screening Not Applicable  Medication Review [employer]) Complete

## 2021-11-25 NOTE — Inpatient Diabetes Management (Signed)
Inpatient Diabetes Program Recommendations  AACE/ADA: New Consensus Statement on Inpatient Glycemic Control (2015)  Target Ranges:  Prepandial:   less than 140 mg/dL      Peak postprandial:   less than 180 mg/dL (1-2 hours)      Critically ill patients:  140 - 180 mg/dL    Latest Reference Range & Units 09/09/21 20:00  Hemoglobin A1C 4.8 - 5.6 % 7.8 (H)  (H): Data is abnormally high  Latest Reference Range & Units 11/24/21 13:15 11/24/21 18:35 11/24/21 20:30 11/25/21 08:53  Glucose-Capillary 70 - 99 mg/dL 122 (H)  1 unit Novolog  211 (H)  3 units Novolog  172 (H)    70 units Semglee @2157  59 (L)  (H): Data is abnormally high (L): Data is abnormally low   Admit with: Abd Pain/ UTI  History: DM, CHF  Home DM Meds: Jardiance 25 mg daily        Tresiba 100 units BID        Metformin 1000 mg BID        Trulicity 4.5 mg Qweek  Current Orders: Novolog Sensitive Correction Scale/ SSI (0-9 units) TID AC + HS     Semglee 70 units BID   MD- Note Hypoglycemia this AM (CBG 59) after getting 70 units Semglee Insulin last PM  Due to get another 70 units Semglee insulin this AM  Please consider reducing the Semglee to 50 units BID (50% home dose)    Endocrinologist: [facility]  Last Seen 09/21/2021 Was told to take the following: Tresiba (U-200) 90 units BID Increase Trulicity to 4.5 mg Qweek Continue Metformin and Jardiance Rx sent in for the Dexcom G7 CGM   --Will follow patient during hospitalization--  Wyn Quaker RN, MSN, [place] Diabetes Coordinator Inpatient Glycemic Control Team Team Pager: [phone] (8a-5p)

## 2021-11-25 NOTE — Plan of Care (Signed)
  Problem: Education: Goal: Ability to describe self-care measures that may prevent or decrease complications (Diabetes Survival Skills Education) will improve Outcome: Progressing   Problem: Coping: Goal: Ability to adjust to condition or change in health will improve Outcome: Progressing   Problem: Health Behavior/Discharge Planning: Goal: Ability to identify and utilize available resources and services will improve Outcome: Progressing   Problem: Health Behavior/Discharge Planning: Goal: Ability to manage health-related needs will improve Outcome: Progressing   Problem: Metabolic: Goal: Ability to maintain appropriate glucose levels will improve Outcome: Progressing   Problem: Nutritional: Goal: Maintenance of adequate nutrition will improve Outcome: Progressing   Problem: Nutritional: Goal: Progress toward achieving an optimal weight will improve Outcome: Progressing   Problem: Skin Integrity: Goal: Risk for impaired skin integrity will decrease Outcome: Progressing   Problem: Tissue Perfusion: Goal: Adequacy of tissue perfusion will improve Outcome: Progressing   Problem: Education: Goal: Knowledge of General Education information will improve Description: Including pain rating scale, medication(s)/side effects and non-pharmacologic comfort measures Outcome: Progressing   Problem: Health Behavior/Discharge Planning: Goal: Ability to manage health-related needs will improve Outcome: Progressing   Problem: Clinical Measurements: Goal: Ability to maintain clinical measurements within normal limits will improve Outcome: Progressing   Problem: Clinical Measurements: Goal: Will remain free from infection Outcome: Adequate for Discharge   Problem: Clinical Measurements: Goal: Diagnostic test results will improve Outcome: Adequate for Discharge   Problem: Clinical Measurements: Goal: Respiratory complications will improve Outcome: Progressing   Problem:  Clinical Measurements: Goal: Cardiovascular complication will be avoided Outcome: Adequate for Discharge   Problem: Activity: Goal: Risk for activity intolerance will decrease Outcome: Progressing   Problem: Nutrition: Goal: Adequate nutrition will be maintained Outcome: Progressing   Problem: Coping: Goal: Level of anxiety will decrease Outcome: Progressing   Problem: Elimination: Goal: Will not experience complications related to bowel motility Outcome: Progressing   Problem: Elimination: Goal: Will not experience complications related to urinary retention Outcome: Progressing   Problem: Pain Managment: Goal: General experience of comfort will improve Outcome: Progressing   Problem: Safety: Goal: Ability to remain free from injury will improve Outcome: Progressing   Problem: Skin Integrity: Goal: Risk for impaired skin integrity will decrease Outcome: Progressing

## 2021-11-26 DIAGNOSIS — I10 Essential (primary) hypertension: Secondary | ICD-10-CM | POA: Diagnosis not present

## 2021-11-26 DIAGNOSIS — E876 Hypokalemia: Secondary | ICD-10-CM | POA: Diagnosis not present

## 2021-11-26 DIAGNOSIS — N390 Urinary tract infection, site not specified: Secondary | ICD-10-CM | POA: Diagnosis not present

## 2021-11-26 LAB — COMPREHENSIVE METABOLIC PANEL
ALT: 20 U/L (ref 0–44)
AST: 25 U/L (ref 15–41)
Albumin: 3.6 g/dL (ref 3.5–5.0)
Alkaline Phosphatase: 104 U/L (ref 38–126)
Anion gap: 7 (ref 5–15)
BUN: 12 mg/dL (ref 6–20)
CO2: 27 mmol/L (ref 22–32)
Calcium: 8.9 mg/dL (ref 8.9–10.3)
Chloride: 109 mmol/L (ref 98–111)
Creatinine, Ser: 0.86 mg/dL (ref 0.61–1.24)
GFR, Estimated: >60 mL/min (ref >60)
Glucose, Bld: 200 mg/dL — ABNORMAL HIGH (ref 70–99)
Potassium: 3.3 mmol/L — ABNORMAL LOW (ref 3.5–5.1)
Sodium: 143 mmol/L (ref 135–145)
Total Bilirubin: 0.5 mg/dL (ref 0.3–1.2)
Total Protein: 6.4 g/dL — ABNORMAL LOW (ref 6.5–8.1)

## 2021-11-26 LAB — CBC
HCT: 47 % (ref 39.0–52.0)
Hemoglobin: 14.9 g/dL (ref 13.0–17.0)
MCH: 28.3 pg (ref 26.0–34.0)
MCHC: 31.7 g/dL (ref 30.0–36.0)
MCV: 89.2 fL (ref 80.0–100.0)
Platelets: 198 10*3/uL (ref 150–400)
RBC: 5.27 MIL/uL (ref 4.22–5.81)
RDW: 13.2 % (ref 11.5–15.5)
WBC: 6.2 10*3/uL (ref 4.0–10.5)
nRBC: 0 % (ref 0.0–0.2)

## 2021-11-26 LAB — URINE CULTURE: Culture: >=100000 — AB

## 2021-11-26 LAB — LIPASE, BLOOD: Lipase: 50 U/L (ref 11–51)

## 2021-11-26 LAB — GLUCOSE, CAPILLARY
Glucose-Capillary: 161 mg/dL — ABNORMAL HIGH (ref 70–99)
Glucose-Capillary: 124 mg/dL — ABNORMAL HIGH (ref 70–99)
Glucose-Capillary: 276 mg/dL — ABNORMAL HIGH (ref 70–99)
Glucose-Capillary: 393 mg/dL — ABNORMAL HIGH (ref 70–99)

## 2021-11-26 LAB — MAGNESIUM: Magnesium: 2.1 mg/dL (ref 1.7–2.4)

## 2021-11-26 MED ORDER — POTASSIUM CHLORIDE CRYS ER 20 MEQ PO TBCR
40.0000 meq | EXTENDED_RELEASE_TABLET | Freq: Once | ORAL | Status: AC
  Administered 2021-11-26: 40 meq via ORAL
  Filled 2021-11-26: qty 2

## 2021-11-26 MED ORDER — BUPRENORPHINE 7.5 MCG/HR TD PTWK
1.0000 | MEDICATED_PATCH | TRANSDERMAL | Status: DC
  Administered 2021-11-26: 1 via TRANSDERMAL
  Filled 2021-11-26 (×2): qty 1

## 2021-11-26 MED ORDER — SODIUM CHLORIDE 0.9 % IV SOLN
3.0000 g | Freq: Four times a day (QID) | INTRAVENOUS | Status: DC
  Administered 2021-11-26 – 2021-11-28 (×9): 3 g via INTRAVENOUS
  Filled 2021-11-26: qty 3
  Filled 2021-11-26 (×2): qty 8
  Filled 2021-11-26 (×8): qty 3
  Filled 2021-11-26: qty 8

## 2021-11-26 NOTE — Plan of Care (Signed)
  Problem: Education: Goal: Ability to describe self-care measures that may prevent or decrease complications (Diabetes Survival Skills Education) will improve Outcome: Progressing   Problem: Coping: Goal: Ability to adjust to condition or change in health will improve Outcome: Progressing   Problem: Fluid Volume: Goal: Ability to maintain a balanced intake and output will improve Outcome: Progressing   Problem: Health Behavior/Discharge Planning: Goal: Ability to identify and utilize available resources and services will improve Outcome: Progressing   Problem: Metabolic: Goal: Ability to maintain appropriate glucose levels will improve Outcome: Progressing   Problem: Nutritional: Goal: Maintenance of adequate nutrition will improve Outcome: Progressing   Problem: Skin Integrity: Goal: Risk for impaired skin integrity will decrease Outcome: Progressing   Problem: Tissue Perfusion: Goal: Adequacy of tissue perfusion will improve Outcome: Progressing   Problem: Health Behavior/Discharge Planning: Goal: Ability to manage health-related needs will improve Outcome: Progressing   Problem: Clinical Measurements: Goal: Respiratory complications will improve Outcome: Progressing   Problem: Clinical Measurements: Goal: Cardiovascular complication will be avoided Outcome: Progressing   Problem: Nutrition: Goal: Adequate nutrition will be maintained Outcome: Progressing   Problem: Coping: Goal: Level of anxiety will decrease Outcome: Progressing   Problem: Pain Managment: Goal: General experience of comfort will improve Outcome: Progressing   Problem: Safety: Goal: Ability to remain free from injury will improve Outcome: Progressing   Problem: Skin Integrity: Goal: Risk for impaired skin integrity will decrease Outcome: Progressing

## 2021-11-26 NOTE — Progress Notes (Signed)
PROGRESS NOTE    Cory Campbell  LOV:[id number] DOB: 1980-09-18 DOA: 11/24/2021 PCP: Donnie Coffin, MD   Assessment & Plan:   Principal Problem:   Abdominal pain Active Problems:   UTI (urinary tract infection)   Proctitis   Pancreatitis   Chronic pain syndrome   Hypokalemia   Asthma   HIV (human immunodeficiency virus infection) (HCC)   BPH (benign prostatic hyperplasia)   Essential hypertension   Chronic diastolic CHF (congestive heart failure) (HCC)   Elevated lactic acid level   Diabetes mellitus without complication (HCC)   Tobacco dependence   Depression with anxiety   Overweight (BMI 25.0-29.9)  Assessment and Plan: Abdominal pain: likely multifactorial etiology, including UTI, possible mild pancreatitis, and possible proctitis. Pt denies abd pain today.  Patient does not meet criteria for sepsis. Abxs changed to IV unasyn. Blood cxs NGTD. Zofran prn. D/c IVFs   UTI: urine cx is growing enterococcus faecalis. Abxs changed to IV unasyn    Possible proctitis: does not have diarrhea.  Continue on IV abxs   Possible mild pancreatitis: lipase is WNL. Liver function normal, bilirubin normal.  CT scan showed normal pancreatitis. Tolerating a regular diet. D/c IVFs. Resolved    Chronic pain syndrome: continue on home dose of butrans patch   Hypokalemia: KCl repleated   Asthma: unknown stage and/or severity. Continue on bronchodilators    HIV: CD4 232 on 05/16/2017, viral load negative on 04/18/2021. Continue on home dose of biktarvy    BPH: continue on home dose of finasteride   HTN: continue on imdur, metoprolol, amlodipine. Holding lisinopril    Chronic diastolic CHF: echo 09/13/4164 showed EF of 50-55%.  BNP normal 22.  CHF appears compensated. Holding aldactone    Elevated lactic acid level: resolved  DM2: HbA1c 7.8, poorly controlled. Continue on glargine, SSI w/ accuchecks    Tobacco dependence: smoking cessation counseling x 5 mins. Nicotine patch to  prevent w/drawl    Depression: severity unknown. Continue on home dose of bupropion, nortriptyline & mirtazapine    Overweight: would benefit from weight loss   Wheelchair bound: continue w/ supportive care       DVT prophylaxis: lovenox  Code Status: full  Family Communication:  Disposition Plan: likely d/c back home  Level of care: Telemetry Medical  Status is: Inpatient Remains inpatient appropriate because: severity of illness    Consultants:    Procedures:  Antimicrobials: unasyn    Subjective: Pt c/o malaise  Objective: Vitals:   11/25/21 0013 11/25/21 0825 11/25/21 1631 11/25/21 2349  BP: (!) 133/109 (!) 142/101 (!) 138/101 (!) 137/99  Pulse: 67 77 87 83  Resp: 17 18 17 18   Temp: (!) 97.5 F (36.4 C) (!) 97.5 F (36.4 C) 98.1 F (36.7 C) (!) 97.5 F (36.4 C)  TempSrc:      SpO2: 100% 100% 100% 95%  Weight:      Height:        Intake/Output Summary (Last 24 hours) at 11/26/2021 0754 Last data filed at 11/26/2021 0500 Gross per 24 hour  Intake 300 ml  Output 3700 ml  Net -3400 ml   Filed Weights   11/24/21 0714  Weight: 78.5 kg    Examination:  General exam: Appears comfortable  Respiratory system: clear breath sounds b/l  Cardiovascular system: S1/S2+. No rubs or clicks  Gastrointestinal system: abd is soft, NT, ND & normal bowel sounds  Central nervous system: Alert and oriented. Psychiatry: Judgement and insight appears normal. Flat mood and  affect     Data Reviewed: I have personally reviewed following labs and imaging studies  CBC: Recent Labs  Lab 11/24/21 0717 11/25/21 0329 11/26/21 0431  WBC 5.7 7.3 6.2  HGB 16.6 15.0 14.9  HCT 52.3* 47.2 47.0  MCV 89.6 89.4 89.2  PLT 259 236 [id number]   Basic Metabolic Panel: Recent Labs  Lab 11/24/21 0717 11/25/21 0329 11/26/21 0431  NA 146* 140 143  K 2.4* 3.0* 3.3*  CL 104 101 109  CO2 32 29 27  GLUCOSE 169* 84 200*  BUN 13 10 12   CREATININE 1.16 0.87 0.86  CALCIUM 10.2  8.5* 8.9  MG 2.4  --  2.1   GFR: Estimated Creatinine Clearance: 110.3 mL/min (by C-G formula based on SCr of 0.86 mg/dL). Liver Function Tests: Recent Labs  Lab 11/24/21 0717 11/26/21 0431  AST 28 25  ALT 25 20  ALKPHOS 127* 104  BILITOT 0.9 0.5  PROT 7.8 6.4*  ALBUMIN 4.3 3.6   Recent Labs  Lab 11/24/21 0717 11/25/21 0329 11/26/21 0431  LIPASE 240* 137* 50   No results for input(s): "AMMONIA" in the last 168 hours. Coagulation Profile: No results for input(s): "INR", "PROTIME" in the last 168 hours. Cardiac Enzymes: No results for input(s): "CKTOTAL", "CKMB", "CKMBINDEX", "TROPONINI" in the last 168 hours. BNP (last 3 results) No results for input(s): "PROBNP" in the last 8760 hours. HbA1C: No results for input(s): "HGBA1C" in the last 72 hours. CBG: Recent Labs  Lab 11/25/21 0853 11/25/21 1044 11/25/21 1153 11/25/21 1707 11/25/21 2328  GLUCAP 59* 168* 246* 235* 272*   Lipid Profile: Recent Labs    11/24/21 0717  TRIG 311*   Thyroid Function Tests: No results for input(s): "TSH", "T4TOTAL", "FREET4", "T3FREE", "THYROIDAB" in the last 72 hours. Anemia Panel: No results for input(s): "VITAMINB12", "FOLATE", "FERRITIN", "TIBC", "IRON", "RETICCTPCT" in the last 72 hours. Sepsis Labs: Recent Labs  Lab 11/24/21 1356 11/25/21 0620  PROCALCITON 0.37  --   LATICACIDVEN 3.0* 1.1    Recent Results (from the past 240 hour(s))  Urine Culture     Status: Abnormal (Preliminary result)   Collection Time: 11/24/21  7:17 AM   Specimen: Urine, Clean Catch  Result Value Ref Range Status   Specimen Description   Final    URINE, CLEAN CATCH Performed at [facility], [address] [place], [place] [address]    Special Requests   Final    NONE Performed at [facility], [address] [place], [place] [address]    Culture (A)  Final    >=100,000 COLONIES/mL ENTEROCOCCUS FAECALIS SUSCEPTIBILITIES TO FOLLOW Performed at [place], [place] [address], [place], [place] [address]    Report Status PENDING  Incomplete  Culture, blood (x 2)     Status: None (Preliminary result)   Collection Time: 11/24/21  1:57 PM   Specimen: BLOOD  Result Value Ref Range Status   Specimen Description BLOOD LEFT ANTECUBITAL  Final   Special Requests   Final    BOTTLES DRAWN AEROBIC AND ANAEROBIC Blood Culture adequate volume   Culture   Final    NO GROWTH < 24 HOURS Performed at [facility], [address] [place], [place] [address]    Report Status PENDING  Incomplete  Culture, blood (x 2)     Status: None (Preliminary result)   Collection Time: 11/24/21  2:23 PM   Specimen: BLOOD  Result Value Ref Range Status   Specimen Description BLOOD  BLOOD RIGHT HAND  Final   Special Requests   Final    BOTTLES DRAWN AEROBIC AND ANAEROBIC Blood Culture adequate volume   Culture   Final    NO GROWTH < 24 HOURS Performed at [facility], [address] [place], [place] [address]    Report Status PENDING  Incomplete         Radiology Studies: CT ABDOMEN PELVIS W CONTRAST  Result Date: 11/24/2021 CLINICAL DATA:  A 41 year old male presents for evaluation of suprapubic and lower abdominal pain that began this morning. EXAM: CT ABDOMEN AND PELVIS WITH CONTRAST TECHNIQUE: Multidetector CT imaging of the abdomen and pelvis was performed using the standard protocol following bolus administration of intravenous contrast. RADIATION DOSE REDUCTION: This exam was performed according to the departmental dose-optimization program which includes automated exposure control, adjustment of the mA and/or kV according to patient size and/or use of iterative reconstruction technique. CONTRAST:  154mL OMNIPAQUE IOHEXOL 300 MG/ML  SOLN COMPARISON:  September 25, 2021 CT of the chest, abdomen and pelvis FINDINGS: Lower chest: Unremarkable to the extent evaluated. Hepatobiliary: Hepatic steatosis which is at least moderate and generalized  throughout the liver. Portal vein is patent. No focal, suspicious hepatic lesion. No pericholecystic stranding. No biliary duct distension. Pancreas: Normal, without mass, inflammation or ductal dilatation. Spleen: Normal. Adrenals/Urinary Tract: Adrenal glands are unremarkable. Symmetric renal enhancement. No sign of hydronephrosis. No suspicious renal lesion or perinephric stranding. Diffuse thickening of the urinary bladder may be increased compared to previous imaging and is associated with mild perivesical stranding no ureteral dilation or ureteral calculi. Stomach/Bowel: Thickening of the low rectum and prominence of the sphincter complex. Liquid stool throughout much of the colon. No pericolonic stranding. No acute small bowel or gastric process. Vascular/Lymphatic: Aorta with smooth contours. IVC with smooth contours. No aneurysmal dilation of the abdominal aorta. There is no gastrohepatic or hepatoduodenal ligament lymphadenopathy. No retroperitoneal or mesenteric lymphadenopathy. No pelvic sidewall lymphadenopathy. Reproductive: Heterogeneity of the prostate is nonspecific. Visualized reproductive structures otherwise unremarkable on CT. Other: Mild stranding about the medial gluteal regions and in the soft tissues overlying the inferior ischia. 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 Abdominal wall soft tissue thickening is similar to prior imaging in the RIGHT lower quadrant. No ascites. Musculoskeletal: Skeletal deformity as discussed. No acute skeletal process. IMPRESSION: 1. Diffuse thickening of the urinary bladder may be increased compared to previous imaging and is associated with mild perivesical stranding. Findings are compatible with cystitis with interval worsening, correlate with urinalysis. 2. Thickening of the low rectum and prominence of the sphincter complex. Correlate with any signs of proctitis or recent fecal impaction. Given the eccentric  nature of thickening favoring RIGHT lateral rectal wall would also consider correlation with direct visualization on follow-up to exclude underlying lesion in this location. 3. Mild stranding of and or thickening of soft tissues in the medial gluteal fold and overlying the inferior aspects of the ischia. Correlate with any developing decubitus changes. 4. Hepatic steatosis. 5. 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 6. Thickening of the RIGHT lower quadrant abdominal wall likely reflecting site of medication injection is unchanged. Electronically Signed   By: Zetta Bills M.D.   On: 11/24/2021 10:40        Scheduled Meds:  acyclovir  400 mg Oral BID   amLODipine  10 mg Oral Daily   aspirin EC  81 mg Oral Daily   bictegravir-emtricitabine-tenofovir AF  1 tablet Oral Daily   buPROPion  150 mg Oral Daily   citalopram  20 mg Oral Daily   dicyclomine  20 mg Oral BID AC   enoxaparin (LOVENOX) injection  40 mg Subcutaneous Q24H   famotidine  20 mg Oral Daily   fesoterodine  4 mg Oral Daily   finasteride  5 mg Oral Daily   gabapentin  300 mg Oral TID   insulin aspart  0-5 Units Subcutaneous QHS   insulin aspart  0-9 Units Subcutaneous TID WC   insulin glargine-yfgn  70 Units Subcutaneous BID   isosorbide mononitrate  30 mg Oral Daily   loratadine  10 mg Oral Daily   metoprolol tartrate  150 mg Oral BID   mirtazapine  45 mg Oral QHS   multivitamin with minerals  1 tablet Oral QHS   naproxen  500 mg Oral BID   nicotine  21 mg Transdermal Daily   nortriptyline  50 mg Oral QHS   omega-3 acid ethyl esters  1 g Oral TID   pantoprazole  40 mg Oral Daily   polyethylene glycol  17 g Oral TID   rosuvastatin  40 mg Oral Daily   tiZANidine  4 mg Oral TID   Continuous Infusions:  sodium chloride 75 mL/hr at 11/26/21 0355   cefTRIAXone (ROCEPHIN)  IV 1 g (11/25/21 1206)   metronidazole Stopped (11/26/21 0033)   sodium chloride       LOS: 2 days    Time  spent: 30 mins     Wyvonnia Dusky, MD [facility] Pager [phone]  If 7PM-7AM, please contact night-coverage 11/26/2021, 7:54 AM

## 2021-11-27 DIAGNOSIS — I10 Essential (primary) hypertension: Secondary | ICD-10-CM | POA: Diagnosis not present

## 2021-11-27 DIAGNOSIS — G894 Chronic pain syndrome: Secondary | ICD-10-CM

## 2021-11-27 DIAGNOSIS — N390 Urinary tract infection, site not specified: Secondary | ICD-10-CM | POA: Diagnosis not present

## 2021-11-27 LAB — CBC
HCT: 45.3 % (ref 39.0–52.0)
Hemoglobin: 14.5 g/dL (ref 13.0–17.0)
MCH: 28.3 pg (ref 26.0–34.0)
MCHC: 32 g/dL (ref 30.0–36.0)
MCV: 88.5 fL (ref 80.0–100.0)
Platelets: 210 10*3/uL (ref 150–400)
RBC: 5.12 MIL/uL (ref 4.22–5.81)
RDW: 13.4 % (ref 11.5–15.5)
WBC: 5.5 10*3/uL (ref 4.0–10.5)
nRBC: 0 % (ref 0.0–0.2)

## 2021-11-27 LAB — COMPREHENSIVE METABOLIC PANEL
ALT: 28 U/L (ref 0–44)
AST: 26 U/L (ref 15–41)
Albumin: 3.5 g/dL (ref 3.5–5.0)
Alkaline Phosphatase: 99 U/L (ref 38–126)
Anion gap: 6 (ref 5–15)
BUN: 12 mg/dL (ref 6–20)
CO2: 27 mmol/L (ref 22–32)
Calcium: 9.1 mg/dL (ref 8.9–10.3)
Chloride: 113 mmol/L — ABNORMAL HIGH (ref 98–111)
Creatinine, Ser: 0.78 mg/dL (ref 0.61–1.24)
GFR, Estimated: >60 mL/min (ref >60)
Glucose, Bld: 163 mg/dL — ABNORMAL HIGH (ref 70–99)
Potassium: 3.5 mmol/L (ref 3.5–5.1)
Sodium: 146 mmol/L — ABNORMAL HIGH (ref 135–145)
Total Bilirubin: 0.5 mg/dL (ref 0.3–1.2)
Total Protein: 6.5 g/dL (ref 6.5–8.1)

## 2021-11-27 LAB — GLUCOSE, CAPILLARY
Glucose-Capillary: 112 mg/dL — ABNORMAL HIGH (ref 70–99)
Glucose-Capillary: 210 mg/dL — ABNORMAL HIGH (ref 70–99)
Glucose-Capillary: 391 mg/dL — ABNORMAL HIGH (ref 70–99)
Glucose-Capillary: 403 mg/dL — ABNORMAL HIGH (ref 70–99)

## 2021-11-27 LAB — MAGNESIUM: Magnesium: 2.1 mg/dL (ref 1.7–2.4)

## 2021-11-27 MED ORDER — LISINOPRIL 20 MG PO TABS
40.0000 mg | ORAL_TABLET | Freq: Every day | ORAL | Status: DC
  Administered 2021-11-27 – 2021-11-28 (×2): 40 mg via ORAL
  Filled 2021-11-27 (×2): qty 2

## 2021-11-27 NOTE — Plan of Care (Signed)

## 2021-11-27 NOTE — Progress Notes (Signed)
PROGRESS NOTE    Cory Campbell  WCH:[id number] DOB: Dec 01, 1980 DOA: 11/24/2021 PCP: Donnie Coffin, MD   Assessment & Plan:   Principal Problem:   Abdominal pain Active Problems:   UTI (urinary tract infection)   Proctitis   Pancreatitis   Chronic pain syndrome   Hypokalemia   Asthma   HIV (human immunodeficiency virus infection) (HCC)   BPH (benign prostatic hyperplasia)   Essential hypertension   Chronic diastolic CHF (congestive heart failure) (HCC)   Elevated lactic acid level   Diabetes mellitus without complication (HCC)   Tobacco dependence   Depression with anxiety   Overweight (BMI 25.0-29.9)  Assessment and Plan: Abdominal pain: likely multifactorial etiology, including UTI, possible mild pancreatitis, and possible proctitis. Pt denies abd pain today.  Patient does not meet criteria for sepsis. Continue on IV unasyn. Blood cxs NGTD. Zofran prn. D/c IVFs   UTI: urine cx is growing enterococcus faecalis. Continue on IV unasyn    Possible proctitis: does not have diarrhea.  Continue on IV abxs   Possible mild pancreatitis: lipase is WNL. Liver function normal, bilirubin normal.  CT scan showed normal pancreatitis. Tolerating a regular diet. D/c IVFs. Resolved    Chronic pain syndrome: continue on home dose of butrans patch   Hypokalemia: WNL today    Asthma: unknown stage and/or severity. Continue on bronchodilators   HIV: CD4 232 on 05/16/2017, viral load negative on 04/18/2021. Continue on home dose of biktarvy    BPH: continue on home dose of finasteride   HTN: continue on metoprolol, amlodipine, imdur & restart lisinopril    Chronic diastolic CHF: echo 04/10/3612 showed EF of 50-55%.  BNP normal 22.  CHF appears compensated. Holding aldactone    Elevated lactic acid level: resolved  DM2: poorly controlled, HbA1c 7.8. Continue on glargine, SSI w/ accuchecks   Tobacco dependence: smoking cessation counseling x 5 mins. Nicotine patch to prevent w/drawl     Depression: severity unknown. Continue on home dose of mirtazapine, nortriptyline, bupropion    Overweight: would benefit from weight loss   Wheelchair bound: continue w/ supportive care       DVT prophylaxis: lovenox  Code Status: full  Family Communication: called pt's wife x 3 and unable to reach so I left a voicemail  Disposition Plan: likely d/c back home  Level of care: Telemetry Medical  Status is: Inpatient Remains inpatient appropriate because: severity of illness, likely d/c home tomorrow & will need EMS transport     Consultants:    Procedures:  Antimicrobials: unasyn    Subjective: Pt c/o malaise  Objective: Vitals:   11/26/21 2015 11/27/21 0010 11/27/21 0149 11/27/21 0436  BP: (!) 147/98 (!) 153/104 (!) 150/100   Pulse: 99 87 92   Resp: 18 18    Temp: 98 F (36.7 C) 97.6 F (36.4 C)    TempSrc: Oral     SpO2: 100% 94%    Weight:    72.9 kg  Height:        Intake/Output Summary (Last 24 hours) at 11/27/2021 0746 Last data filed at 11/27/2021 0434 Gross per 24 hour  Intake 456 ml  Output 3400 ml  Net -2944 ml   Filed Weights   11/24/21 0714 11/27/21 0436  Weight: 78.5 kg 72.9 kg    Examination:  General exam: Appears calm & comfortable  Respiratory system: clear breath sounds b/l Cardiovascular system: S1 & S2+. No rubs or clicks  Gastrointestinal system: abd is soft, NT, ND &  normal bowel sounds  Central nervous system: Alert and oriented.  Psychiatry: Judgement and insight appears normal. Flat mood and affect     Data Reviewed: I have personally reviewed following labs and imaging studies  CBC: Recent Labs  Lab 11/24/21 0717 11/25/21 0329 11/26/21 0431 11/27/21 0538  WBC 5.7 7.3 6.2 5.5  HGB 16.6 15.0 14.9 14.5  HCT 52.3* 47.2 47.0 45.3  MCV 89.6 89.4 89.2 88.5  PLT 259 236 198 [id number]   Basic Metabolic Panel: Recent Labs  Lab 11/24/21 0717 11/25/21 0329 11/26/21 0431 11/27/21 0538  NA 146* 140 143 146*  K  2.4* 3.0* 3.3* 3.5  CL 104 101 109 113*  CO2 32 29 27 27   GLUCOSE 169* 84 200* 163*  BUN 13 10 12 12   CREATININE 1.16 0.87 0.86 0.78  CALCIUM 10.2 8.5* 8.9 9.1  MG 2.4  --  2.1 2.1   GFR: Estimated Creatinine Clearance: 108.5 mL/min (by C-G formula based on SCr of 0.78 mg/dL). Liver Function Tests: Recent Labs  Lab 11/24/21 0717 11/26/21 0431 11/27/21 0538  AST 28 25 26   ALT 25 20 28   ALKPHOS 127* 104 99  BILITOT 0.9 0.5 0.5  PROT 7.8 6.4* 6.5  ALBUMIN 4.3 3.6 3.5   Recent Labs  Lab 11/24/21 0717 11/25/21 0329 11/26/21 0431  LIPASE 240* 137* 50   No results for input(s): "AMMONIA" in the last 168 hours. Coagulation Profile: No results for input(s): "INR", "PROTIME" in the last 168 hours. Cardiac Enzymes: No results for input(s): "CKTOTAL", "CKMB", "CKMBINDEX", "TROPONINI" in the last 168 hours. BNP (last 3 results) No results for input(s): "PROBNP" in the last 8760 hours. HbA1C: No results for input(s): "HGBA1C" in the last 72 hours. CBG: Recent Labs  Lab 11/25/21 2328 11/26/21 0806 11/26/21 1202 11/26/21 1655 11/26/21 2017  GLUCAP 272* 161* 124* 276* 393*   Lipid Profile: No results for input(s): "CHOL", "HDL", "LDLCALC", "TRIG", "CHOLHDL", "LDLDIRECT" in the last 72 hours.  Thyroid Function Tests: No results for input(s): "TSH", "T4TOTAL", "FREET4", "T3FREE", "THYROIDAB" in the last 72 hours. Anemia Panel: No results for input(s): "VITAMINB12", "FOLATE", "FERRITIN", "TIBC", "IRON", "RETICCTPCT" in the last 72 hours. Sepsis Labs: Recent Labs  Lab 11/24/21 1356 11/25/21 0620  PROCALCITON 0.37  --   LATICACIDVEN 3.0* 1.1    Recent Results (from the past 240 hour(s))  Urine Culture     Status: Abnormal   Collection Time: 11/24/21  7:17 AM   Specimen: Urine, Clean Catch  Result Value Ref Range Status   Specimen Description   Final    URINE, CLEAN CATCH Performed at [facility], [address] [place], [place] [address]    Special  Requests   Final    NONE Performed at [facility], [address] [place], [place] [address]    Culture >=100,000 COLONIES/mL ENTEROCOCCUS FAECALIS (A)  Final   Report Status 11/26/2021 FINAL  Final   Organism ID, Bacteria ENTEROCOCCUS FAECALIS (A)  Final      Susceptibility   Enterococcus faecalis - MIC*    AMPICILLIN <=2 SENSITIVE Sensitive     NITROFURANTOIN <=16 SENSITIVE Sensitive     VANCOMYCIN 1 SENSITIVE Sensitive     * >=100,000 COLONIES/mL ENTEROCOCCUS FAECALIS  Culture, blood (x 2)     Status: None (Preliminary result)   Collection Time: 11/24/21  1:57 PM   Specimen: BLOOD  Result Value Ref Range Status   Specimen Description BLOOD LEFT ANTECUBITAL  Final   Special Requests  Final    BOTTLES DRAWN AEROBIC AND ANAEROBIC Blood Culture adequate volume   Culture   Final    NO GROWTH 3 DAYS Performed at [facility], [address] [place], [place] [address]    Report Status PENDING  Incomplete  Culture, blood (x 2)     Status: None (Preliminary result)   Collection Time: 11/24/21  2:23 PM   Specimen: BLOOD  Result Value Ref Range Status   Specimen Description BLOOD BLOOD RIGHT HAND  Final   Special Requests   Final    BOTTLES DRAWN AEROBIC AND ANAEROBIC Blood Culture adequate volume   Culture   Final    NO GROWTH 3 DAYS Performed at [facility], [address], [place], [place] [address]    Report Status PENDING  Incomplete         Radiology Studies: No results found.      Scheduled Meds:  acyclovir  400 mg Oral BID   amLODipine  10 mg Oral Daily   aspirin EC  81 mg Oral Daily   bictegravir-emtricitabine-tenofovir AF  1 tablet Oral Daily   buprenorphine  1 patch Transdermal Weekly   buPROPion  150 mg Oral Daily   citalopram  20 mg Oral Daily   dicyclomine  20 mg Oral BID AC   enoxaparin (LOVENOX) injection  40 mg Subcutaneous Q24H   famotidine  20 mg Oral Daily   fesoterodine  4 mg Oral Daily   finasteride  5 mg  Oral Daily   gabapentin  300 mg Oral TID   insulin aspart  0-5 Units Subcutaneous QHS   insulin aspart  0-9 Units Subcutaneous TID WC   insulin glargine-yfgn  70 Units Subcutaneous BID   isosorbide mononitrate  30 mg Oral Daily   loratadine  10 mg Oral Daily   metoprolol tartrate  150 mg Oral BID   mirtazapine  45 mg Oral QHS   multivitamin with minerals  1 tablet Oral QHS   naproxen  500 mg Oral BID   nicotine  21 mg Transdermal Daily   nortriptyline  50 mg Oral QHS   omega-3 acid ethyl esters  1 g Oral TID   pantoprazole  40 mg Oral Daily   polyethylene glycol  17 g Oral TID   rosuvastatin  40 mg Oral Daily   tiZANidine  4 mg Oral TID   Continuous Infusions:  ampicillin-sulbactam (UNASYN) IV 3 g (11/27/21 0527)   sodium chloride       LOS: 3 days    Time spent: 25 mins     Wyvonnia Dusky, MD [facility] Pager [phone]  If 7PM-7AM, please contact night-coverage 11/27/2021, 7:46 AM

## 2021-11-27 NOTE — Plan of Care (Signed)
  Problem: Education: Goal: Ability to describe self-care measures that may prevent or decrease complications (Diabetes Survival Skills Education) will improve Outcome: Progressing   Problem: Health Behavior/Discharge Planning: Goal: Ability to identify and utilize available resources and services will improve Outcome: Progressing   Problem: Coping: Goal: Ability to adjust to condition or change in health will improve Outcome: Progressing   Problem: Skin Integrity: Goal: Risk for impaired skin integrity will decrease Outcome: Progressing   Problem: Nutritional: Goal: Maintenance of adequate nutrition will improve Outcome: Progressing   Problem: Activity: Goal: Risk for activity intolerance will decrease Outcome: Progressing   Problem: Metabolic: Goal: Ability to maintain appropriate glucose levels will improve Outcome: Progressing

## 2021-11-28 DIAGNOSIS — N390 Urinary tract infection, site not specified: Secondary | ICD-10-CM | POA: Diagnosis not present

## 2021-11-28 DIAGNOSIS — I10 Essential (primary) hypertension: Secondary | ICD-10-CM | POA: Diagnosis not present

## 2021-11-28 DIAGNOSIS — E876 Hypokalemia: Secondary | ICD-10-CM | POA: Diagnosis not present

## 2021-11-28 LAB — COMPREHENSIVE METABOLIC PANEL
ALT: 29 U/L (ref 0–44)
AST: 28 U/L (ref 15–41)
Albumin: 3.4 g/dL — ABNORMAL LOW (ref 3.5–5.0)
Alkaline Phosphatase: 87 U/L (ref 38–126)
Anion gap: 6 (ref 5–15)
BUN: 14 mg/dL (ref 6–20)
CO2: 27 mmol/L (ref 22–32)
Calcium: 8.9 mg/dL (ref 8.9–10.3)
Chloride: 110 mmol/L (ref 98–111)
Creatinine, Ser: 0.78 mg/dL (ref 0.61–1.24)
GFR, Estimated: >60 mL/min (ref >60)
Glucose, Bld: 230 mg/dL — ABNORMAL HIGH (ref 70–99)
Potassium: 3.4 mmol/L — ABNORMAL LOW (ref 3.5–5.1)
Sodium: 143 mmol/L (ref 135–145)
Total Bilirubin: 0.6 mg/dL (ref 0.3–1.2)
Total Protein: 6.3 g/dL — ABNORMAL LOW (ref 6.5–8.1)

## 2021-11-28 LAB — CBC
HCT: 45 % (ref 39.0–52.0)
Hemoglobin: 14.3 g/dL (ref 13.0–17.0)
MCH: 28.2 pg (ref 26.0–34.0)
MCHC: 31.8 g/dL (ref 30.0–36.0)
MCV: 88.8 fL (ref 80.0–100.0)
Platelets: 228 10*3/uL (ref 150–400)
RBC: 5.07 MIL/uL (ref 4.22–5.81)
RDW: 13.6 % (ref 11.5–15.5)
WBC: 6.6 10*3/uL (ref 4.0–10.5)
nRBC: 0 % (ref 0.0–0.2)

## 2021-11-28 LAB — GLUCOSE, CAPILLARY
Glucose-Capillary: 245 mg/dL — ABNORMAL HIGH (ref 70–99)
Glucose-Capillary: 220 mg/dL — ABNORMAL HIGH (ref 70–99)
Glucose-Capillary: 358 mg/dL — ABNORMAL HIGH (ref 70–99)
Glucose-Capillary: 257 mg/dL — ABNORMAL HIGH (ref 70–99)

## 2021-11-28 LAB — MAGNESIUM: Magnesium: 2 mg/dL (ref 1.7–2.4)

## 2021-11-28 MED ORDER — INSULIN GLARGINE-YFGN 100 UNIT/ML ~~LOC~~ SOLN
70.0000 [IU] | Freq: Every day | SUBCUTANEOUS | Status: DC
  Filled 2021-11-28: qty 0.7

## 2021-11-28 MED ORDER — INSULIN ASPART 100 UNIT/ML IJ SOLN
6.0000 [IU] | Freq: Three times a day (TID) | INTRAMUSCULAR | Status: DC
  Administered 2021-11-28 (×2): 6 [IU] via SUBCUTANEOUS
  Filled 2021-11-28 (×2): qty 1

## 2021-11-28 MED ORDER — INSULIN GLARGINE-YFGN 100 UNIT/ML ~~LOC~~ SOLN
75.0000 [IU] | Freq: Every day | SUBCUTANEOUS | Status: DC
  Administered 2021-11-28: 75 [IU] via SUBCUTANEOUS
  Filled 2021-11-28: qty 0.75

## 2021-11-28 MED ORDER — NITROFURANTOIN MACROCRYSTAL 100 MG PO CAPS: 100.0000 mg | ORAL_CAPSULE | Freq: Four times a day (QID) | ORAL | 0 refills | Status: AC

## 2021-11-28 NOTE — Progress Notes (Signed)
EMS picked up patient to transport home. Pt has discharge packet, and all personal belongings sent with him.

## 2021-11-28 NOTE — Discharge Summary (Signed)
Physician Discharge Summary  Cory Campbell [id number] DOB: 01/01/1981 DOA: 11/24/2021  PCP: Cory Coffin, MD  Admit date: 11/24/2021 Discharge date: 11/28/2021  Admitted From: home  Disposition:  home   Recommendations for Outpatient Follow-up:  Follow up with PCP in 1-2 weeks  Home Health: no  Equipment/Devices:  Discharge Condition: stable  CODE STATUS: full Diet recommendation: regular  Brief/Interim Summary: HPI was taken from Dr. Blaine Campbell: Cory Campbell is a 41 y.o. male with medical history significant of s/p of partial colectomy, diverticulitis, s/p of corneal transplantation, arthrogryposis multiplex congenita, wheelchair-bound, hypertension, hyperlipidemia, diabetes mellitus, asthma, hypothyroidism, depression with anxiety, remote seizure not on any medication, HIV, chronic pain syndrome.  BPH, cCHF, who presents with abdominal pain and dysuria.   Patient states that he has abdominal pain for past 5 days, which has been persistent.  It is located in suprapubic area, constant, sharp, moderate to severe, nonradiating.  Patient has nausea, no vomiting or diarrhea.  No diarrhea or rectal bleeding.  No fever or chills.  Patient also reports dysuria and increased urinary frequency, no burning on urination or hematuria.  No chest pain, cough, shortness of breath.   Data reviewed independently and ED Course: pt was found to have WBC 5.7, lipase 240, BNP 22, positive urinalysis (hazy appearance, trace amount of leukocyte, negative bacteria, WBC> 50), GFR> 60, temperature 97.3, blood pressure 179/110 8, heart rate 106, RR 18, oxygen saturation 100% on room air.  Patient is admitted to telemetry bed as inpatient.      CTA-abd/pelvis 1. Diffuse thickening of the urinary bladder may be increased compared to previous imaging and is associated with mild perivesical stranding. Findings are compatible with cystitis with interval worsening, correlate with urinalysis. 2. Thickening of  the low rectum and prominence of the sphincter complex. Correlate with any signs of proctitis or recent fecal impaction. Given the eccentric nature of thickening favoring RIGHT lateral rectal wall would also consider correlation with direct visualization on follow-up to exclude underlying lesion in this location. 3. Mild stranding of and or thickening of soft tissues in the medial gluteal fold and overlying the inferior aspects of the ischia. Correlate with any developing decubitus changes. 4. Hepatic steatosis. 5. 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 6. Thickening of the RIGHT lower quadrant abdominal wall likely reflecting site of medication injection is unchanged. 7. Pancreas: Normal, without mass, inflammation or ductal dilatation   As per Dr. Jimmye Campbell 10/20-10/23/23: Pt presented w/ abd pain that was possibly due to UTI vs mild pancreatitis vs possible proctitis. The abd pain resolved prior to d/c. Of note, pt was treated for UTI secondary to enterococcus faecalis. Pt was d/c home on po nitrofurantion to complete the course.   Discharge Diagnoses:  Principal Problem:   Abdominal pain Active Problems:   UTI (urinary tract infection)   Proctitis   Pancreatitis   Chronic pain syndrome   Hypokalemia   Asthma   HIV (human immunodeficiency virus infection) (HCC)   BPH (benign prostatic hyperplasia)   Essential hypertension   Chronic diastolic CHF (congestive heart failure) (HCC)   Elevated lactic acid level   Diabetes mellitus without complication (HCC)   Tobacco dependence   Depression with anxiety   Overweight (BMI 25.0-29.9)  Abdominal pain: likely multifactorial etiology, including UTI, possible mild pancreatitis, and possible proctitis. Pt denies abd pain today.  Patient does not meet criteria for sepsis. Continue on IV unasyn. Blood cxs NGTD. Zofran prn. D/c IVFs. Resolved  UTI: urine cx is growing enterococcus faecalis. Continue  on IV unasyn while inpatient and d/c home on po nitrofurantion   Possible proctitis: does not have diarrhea.  Continue on abxs   Possible mild pancreatitis: lipase is WNL. Liver function normal, bilirubin normal.  CT scan showed normal pancreatitis. Tolerating a regular diet. D/c IVFs. Resolved    Chronic pain syndrome: continue on home dose of butrans patch    Hypokalemia: encourage po intake     Asthma: unknown stage and/or severity. Continue on bronchodilators   HIV: CD4 232 on 05/16/2017, viral load negative on 04/18/2021. Continue on home dose of biktarvy    BPH: continue on home dose of finasteride    HTN: continue on metoprolol, amlodipine, imdur & restart lisinopril    Chronic diastolic CHF: echo 0000000 showed EF of 50-55%.  BNP normal 22.  CHF appears compensated. Holding aldactone    Elevated lactic acid level: resolved   DM2: poorly controlled, HbA1c 7.8. Continue on glargine, SSI w/ accuchecks   Tobacco dependence: smoking cessation counseling x 5 mins. Nicotine patch to prevent w/drawl    Depression: severity unknown. Continue on home dose of mirtazapine, nortriptyline, bupropion    Overweight: would benefit from weight loss    Wheelchair bound: continue w/ supportive care     Discharge Instructions  Discharge Instructions     Diet - low sodium heart healthy   Complete by: As directed    Diet Carb Modified   Complete by: As directed    Discharge instructions   Complete by: As directed    F/u w/ PCP in 1-2 weeks.   Increase activity slowly   Complete by: As directed       Allergies as of 11/28/2021       Reactions   Penicillins Hives, Itching, Rash   Has patient had a PCN reaction causing immediate rash, facial/tongue/throat swelling, SOB or lightheadedness with hypotension: Yes Has patient had a PCN reaction causing severe rash involving mucus membranes or skin necrosis: No Has patient had a PCN reaction that required hospitalization No Has patient  had a PCN reaction occurring within the last 10 years: No If all of the above answers are "NO", then may proceed with Cephalosporin use. Has tolerated amoxicillin without problems   Erythromycin Base Itching, Rash        Medication List     STOP taking these medications    lidocaine 5 % Commonly known as: LIDODERM   prednisoLONE acetate 1 % ophthalmic suspension Commonly known as: PRED FORTE       TAKE these medications    acyclovir 400 MG tablet Commonly known as: ZOVIRAX Take 400 mg by mouth 2 (two) times daily.   AeroChamber Plus inhaler Use as instructed   albuterol (2.5 MG/3ML) 0.083% nebulizer solution Commonly known as: PROVENTIL Take 2.5 mg by nebulization every 4 (four) hours as needed for wheezing or shortness of breath.   albuterol 108 (90 Base) MCG/ACT inhaler Commonly known as: VENTOLIN HFA Inhale 2 puffs into the lungs every 4 (four) hours as needed for wheezing or shortness of breath.   amLODipine 10 MG tablet Commonly known as: NORVASC Take 10 mg by mouth daily.   aspirin EC 81 MG tablet Take 81 mg by mouth daily. Swallow whole.   Biktarvy 50-200-25 MG Tabs tablet Generic drug: bictegravir-emtricitabine-tenofovir AF Take 1 tablet by mouth daily.   buprenorphine 7.5 MCG/HR Commonly known as: BUTRANS Place 7.5 mg onto the skin once a week. Saturday  buPROPion 150 MG 24 hr tablet Commonly known as: WELLBUTRIN XL Take 150 mg by mouth daily.   cetirizine 10 MG tablet Commonly known as: ZYRTEC Take 10 mg by mouth daily.   citalopram 20 MG tablet Commonly known as: CELEXA Take 20 mg by mouth daily.   cloNIDine 0.3 mg/24hr patch Commonly known as: CATAPRES - Dosed in mg/24 hr 0.3 mg once a week.   diclofenac sodium 1 % Gel Commonly known as: VOLTAREN Apply 2-4 g topically 4 (four) times daily as needed. For pain.   dicyclomine 10 MG capsule Commonly known as: BENTYL Take 20 mg by mouth 2 (two) times daily before a meal.    empagliflozin 25 MG Tabs tablet Commonly known as: JARDIANCE Take 25 mg by mouth daily.   famotidine 20 MG tablet Commonly known as: PEPCID Take 20 mg by mouth daily.   feeding supplement (GLUCERNA SHAKE) Liqd Take 237 mLs by mouth 4 (four) times daily.   finasteride 5 MG tablet Commonly known as: PROSCAR Take 5 mg by mouth daily.   fluticasone 110 MCG/ACT inhaler Commonly known as: FLOVENT HFA Inhale 2 puffs into the lungs 2 (two) times daily.   fluticasone 50 MCG/ACT nasal spray Commonly known as: FLONASE Place 2 sprays into both nostrils daily.   gabapentin 300 MG capsule Commonly known as: NEURONTIN Take 1 capsule (300 mg total) by mouth 2 (two) times daily. What changed:  when to take this Another medication with the same name was removed. Continue taking this medication, and follow the directions you see here.   glucose 4 GM chewable tablet Chew 1 tablet by mouth as needed for low blood sugar.   insulin degludec 100 UNIT/ML FlexTouch Pen Commonly known as: TRESIBA Inject 100 Units into the skin 2 (two) times daily.   ipratropium 0.06 % nasal spray Commonly known as: ATROVENT Place 2 sprays into both nostrils 4 (four) times daily.   isosorbide mononitrate 30 MG 24 hr tablet Commonly known as: IMDUR Take 30 mg by mouth daily.   ketoconazole 2 % shampoo Commonly known as: NIZORAL Apply 1 application topically 2 (two) times a week. tues and thursday   lisinopril 40 MG tablet Commonly known as: ZESTRIL Take 40 mg by mouth daily.   melatonin 5 MG Tabs Take 10 mg by mouth at bedtime as needed (sleep).   metFORMIN 500 MG 24 hr tablet Commonly known as: GLUCOPHAGE-XR Take 1,000 mg by mouth 2 (two) times daily.   metoprolol tartrate 100 MG tablet Commonly known as: LOPRESSOR TAKE 1 AND 1/2 TABLETS(150 MG) BY MOUTH TWICE DAILY   mirtazapine 45 MG tablet Commonly known as: REMERON Take 45 mg by mouth at bedtime.   multivitamin tablet Take 1 tablet by  mouth daily.   naproxen 500 MG tablet Commonly known as: NAPROSYN Take 500 mg by mouth 2 (two) times daily.   nitrofurantoin 100 MG capsule Commonly known as: MACRODANTIN Take 1 capsule (100 mg total) by mouth 4 (four) times daily for 5 days.   nitroGLYCERIN 0.4 MG SL tablet Commonly known as: NITROSTAT PLACE 1 TABLET SUBLINGUALLY EVERY 5 MINUTES AS NEEDED FOR CHEST PAIN. CALL 911 IF YOU HAVE TAKEN 3 NITRO FOR CHEST PAIN   nortriptyline 25 MG capsule Commonly known as: PAMELOR Take 50 mg by mouth at bedtime.   omega-3 acid ethyl esters 1 g capsule Commonly known as: LOVAZA Take 1 g by mouth 3 (three) times daily.   omeprazole 20 MG capsule Commonly known as: PRILOSEC Take 20  mg by mouth daily.   ondansetron 4 MG disintegrating tablet Commonly known as: ZOFRAN-ODT Take 4 mg by mouth every 8 (eight) hours as needed for nausea or vomiting.   polyethylene glycol 17 g packet Commonly known as: MIRALAX / GLYCOLAX Take 17 g by mouth 3 (three) times daily.   rosuvastatin 40 MG tablet Commonly known as: CRESTOR TAKE 1 TABLET(40 MG) BY MOUTH DAILY   solifenacin 10 MG tablet Commonly known as: VESICARE Take 10 mg by mouth daily.   spironolactone 50 MG tablet Commonly known as: ALDACTONE Take 100 mg by mouth daily.   T.E.D. Below Knee/Medium Misc 1 each by Does not apply route daily. Measure legs and give appropriate size.   tiZANidine 4 MG tablet Commonly known as: Zanaflex Take 1 tablet (4 mg total) by mouth 3 (three) times daily.   Trulicity 4.5 0000000 Sopn Generic drug: Dulaglutide SMARTSIG:4.5 Milligram(s) SUB-Q Once a Week What changed: Another medication with the same name was removed. Continue taking this medication, and follow the directions you see here.        Allergies  Allergen Reactions   Penicillins Hives, Itching and Rash    Has patient had a PCN reaction causing immediate rash, facial/tongue/throat swelling, SOB or lightheadedness with  hypotension: Yes Has patient had a PCN reaction causing severe rash involving mucus membranes or skin necrosis: No Has patient had a PCN reaction that required hospitalization No Has patient had a PCN reaction occurring within the last 10 years: No If all of the above answers are "NO", then may proceed with Cephalosporin use.  Has tolerated amoxicillin without problems   Erythromycin Base Itching and Rash    Consultations:    Procedures/Studies: CT ABDOMEN PELVIS W CONTRAST  Result Date: 11/24/2021 CLINICAL DATA:  A 41 year old male presents for evaluation of suprapubic and lower abdominal pain that began this morning. EXAM: CT ABDOMEN AND PELVIS WITH CONTRAST TECHNIQUE: Multidetector CT imaging of the abdomen and pelvis was performed using the standard protocol following bolus administration of intravenous contrast. RADIATION DOSE REDUCTION: This exam was performed according to the departmental dose-optimization program which includes automated exposure control, adjustment of the mA and/or kV according to patient size and/or use of iterative reconstruction technique. CONTRAST:  128mL OMNIPAQUE IOHEXOL 300 MG/ML  SOLN COMPARISON:  September 25, 2021 CT of the chest, abdomen and pelvis FINDINGS: Lower chest: Unremarkable to the extent evaluated. Hepatobiliary: Hepatic steatosis which is at least moderate and generalized throughout the liver. Portal vein is patent. No focal, suspicious hepatic lesion. No pericholecystic stranding. No biliary duct distension. Pancreas: Normal, without mass, inflammation or ductal dilatation. Spleen: Normal. Adrenals/Urinary Tract: Adrenal glands are unremarkable. Symmetric renal enhancement. No sign of hydronephrosis. No suspicious renal lesion or perinephric stranding. Diffuse thickening of the urinary bladder may be increased compared to previous imaging and is associated with mild perivesical stranding no ureteral dilation or ureteral calculi. Stomach/Bowel:  Thickening of the low rectum and prominence of the sphincter complex. Liquid stool throughout much of the colon. No pericolonic stranding. No acute small bowel or gastric process. Vascular/Lymphatic: Aorta with smooth contours. IVC with smooth contours. No aneurysmal dilation of the abdominal aorta. There is no gastrohepatic or hepatoduodenal ligament lymphadenopathy. No retroperitoneal or mesenteric lymphadenopathy. No pelvic sidewall lymphadenopathy. Reproductive: Heterogeneity of the prostate is nonspecific. Visualized reproductive structures otherwise unremarkable on CT. Other: Mild stranding about the medial gluteal regions and in the soft tissues overlying the inferior ischia. Signs of hip dysplasia with chronic superior dislocation of bilateral  hips, unchanged. Abdominal wall soft tissue thickening is similar to prior imaging in the RIGHT lower quadrant. No ascites. Musculoskeletal: Skeletal deformity as discussed. No acute skeletal process. IMPRESSION: 1. Diffuse thickening of the urinary bladder may be increased compared to previous imaging and is associated with mild perivesical stranding. Findings are compatible with cystitis with interval worsening, correlate with urinalysis. 2. Thickening of the low rectum and prominence of the sphincter complex. Correlate with any signs of proctitis or recent fecal impaction. Given the eccentric nature of thickening favoring RIGHT lateral rectal wall would also consider correlation with direct visualization on follow-up to exclude underlying lesion in this location. 3. Mild stranding of and or thickening of soft tissues in the medial gluteal fold and overlying the inferior aspects of the ischia. Correlate with any developing decubitus changes. 4. Hepatic steatosis. 5. 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 6. Thickening of the RIGHT lower quadrant abdominal wall likely reflecting site of medication injection is unchanged.  Electronically Signed   By: Zetta Bills M.D.   On: 11/24/2021 10:40   (Echo, Carotid, EGD, Colonoscopy, ERCP)    Subjective: Pt denies any complaints    Discharge Exam: Vitals:   11/28/21 0540 11/28/21 0749  BP: (!) 133/92 (!) 145/108  Pulse: 89 89  Resp: 18 18  Temp:  97.8 F (36.6 C)  SpO2: 99% 96%   Vitals:   11/27/21 1508 11/28/21 0500 11/28/21 0540 11/28/21 0749  BP: (!) 143/101  (!) 133/92 (!) 145/108  Pulse: 90  89 89  Resp: 16  18 18   Temp: (!) 97.5 F (36.4 C)   97.8 F (36.6 C)  TempSrc:      SpO2: 95%  99% 96%  Weight:  73 kg    Height:        General: Pt is alert, awake, not in acute distress Cardiovascular: S1/S2 +, no rubs, no gallops Respiratory: CTA bilaterally, no wheezing, no rhonchi Abdominal: Soft, NT, ND, bowel sounds + Extremities: no cyanosis    The results of significant diagnostics from this hospitalization (including imaging, microbiology, ancillary and laboratory) are listed below for reference.     Microbiology: Recent Results (from the past 240 hour(s))  Urine Culture     Status: Abnormal   Collection Time: 11/24/21  7:17 AM   Specimen: Urine, Clean Catch  Result Value Ref Range Status   Specimen Description   Final    URINE, CLEAN CATCH Performed at [facility], [address] [place], [place] [address]    Special Requests   Final    NONE Performed at [facility], [address] [place], [place] [address]    Culture >=100,000 COLONIES/mL ENTEROCOCCUS FAECALIS (A)  Final   Report Status 11/26/2021 FINAL  Final   Organism ID, Bacteria ENTEROCOCCUS FAECALIS (A)  Final      Susceptibility   Enterococcus faecalis - MIC*    AMPICILLIN <=2 SENSITIVE Sensitive     NITROFURANTOIN <=16 SENSITIVE Sensitive     VANCOMYCIN 1 SENSITIVE Sensitive     * >=100,000 COLONIES/mL ENTEROCOCCUS FAECALIS  Culture, blood (x 2)     Status: None (Preliminary result)   Collection Time: 11/24/21  1:57 PM   Specimen: BLOOD   Result Value Ref Range Status   Specimen Description BLOOD LEFT ANTECUBITAL  Final   Special Requests   Final    BOTTLES DRAWN AEROBIC AND ANAEROBIC Blood Culture adequate volume   Culture   Final  NO GROWTH 4 DAYS Performed at [facility], [address] [place], [place] [address]    Report Status PENDING  Incomplete  Culture, blood (x 2)     Status: None (Preliminary result)   Collection Time: 11/24/21  2:23 PM   Specimen: BLOOD  Result Value Ref Range Status   Specimen Description BLOOD BLOOD RIGHT HAND  Final   Special Requests   Final    BOTTLES DRAWN AEROBIC AND ANAEROBIC Blood Culture adequate volume   Culture   Final    NO GROWTH 4 DAYS Performed at [facility], [address] [place],  [address]    Report Status PENDING  Incomplete     Labs: BNP (last 3 results) Recent Labs    11/24/21 0717  BNP [id number]   Basic Metabolic Panel: Recent Labs  Lab 11/24/21 0717 11/25/21 0329 11/26/21 0431 11/27/21 0538 11/28/21 0259  NA 146* 140 143 146* 143  K 2.4* 3.0* 3.3* 3.5 3.4*  CL 104 101 109 113* 110  CO2 32 29 27 27 27   GLUCOSE 169* 84 200* 163* 230*  BUN 13 10 12 12 14   CREATININE 1.16 0.87 0.86 0.78 0.78  CALCIUM 10.2 8.5* 8.9 9.1 8.9  MG 2.4  --  2.1 2.1 2.0   Liver Function Tests: Recent Labs  Lab 11/24/21 0717 11/26/21 0431 11/27/21 0538 11/28/21 0259  AST 28 25 26 28   ALT 25 20 28 29   ALKPHOS 127* 104 99 87  BILITOT 0.9 0.5 0.5 0.6  PROT 7.8 6.4* 6.5 6.3*  ALBUMIN 4.3 3.6 3.5 3.4*   Recent Labs  Lab 11/24/21 0717 11/25/21 0329 11/26/21 0431  LIPASE 240* 137* 50   No results for input(s): "AMMONIA" in the last 168 hours. CBC: Recent Labs  Lab 11/24/21 0717 11/25/21 0329 11/26/21 0431 11/27/21 0538 11/28/21 0259  WBC 5.7 7.3 6.2 5.5 6.6  HGB 16.6 15.0 14.9 14.5 14.3  HCT 52.3* 47.2 47.0 45.3 45.0  MCV 89.6 89.4 89.2 88.5 88.8  PLT 259 236 198 210 228   Cardiac Enzymes: No results for input(s):  "CKTOTAL", "CKMB", "CKMBINDEX", "TROPONINI" in the last 168 hours. BNP: Invalid input(s): "POCBNP" CBG: Recent Labs  Lab 11/27/21 1146 11/27/21 1736 11/27/21 2025 11/28/21 0750 11/28/21 1215  GLUCAP 210* 391* 403* 245* 220*   D-Dimer No results for input(s): "DDIMER" in the last 72 hours. Hgb A1c No results for input(s): "HGBA1C" in the last 72 hours. Lipid Profile No results for input(s): "CHOL", "HDL", "LDLCALC", "TRIG", "CHOLHDL", "LDLDIRECT" in the last 72 hours. Thyroid function studies No results for input(s): "TSH", "T4TOTAL", "T3FREE", "THYROIDAB" in the last 72 hours.  Invalid input(s): "FREET3" Anemia work up No results for input(s): "VITAMINB12", "FOLATE", "FERRITIN", "TIBC", "IRON", "RETICCTPCT" in the last 72 hours. Urinalysis    Component Value Date/Time   COLORURINE YELLOW (A) 11/24/2021 0717   APPEARANCEUR HAZY (A) 11/24/2021 0717   APPEARANCEUR Cloudy 05/16/2014 0350   LABSPEC 1.030 11/24/2021 0717   LABSPEC 1.018 05/16/2014 0350   PHURINE 6.0 11/24/2021 0717   GLUCOSEU >=500 (A) 11/24/2021 0717   GLUCOSEU Negative 05/16/2014 0350   HGBUR SMALL (A) 11/24/2021 0717   BILIRUBINUR NEGATIVE 11/24/2021 0717   BILIRUBINUR Negative 05/16/2014 0350   KETONESUR 5 (A) 11/24/2021 0717   PROTEINUR 100 (A) 11/24/2021 0717   NITRITE NEGATIVE 11/24/2021 0717   LEUKOCYTESUR TRACE (A) 11/24/2021 0717   LEUKOCYTESUR 1+ 05/16/2014 0350   Sepsis Labs Recent Labs  Lab 11/25/21 0329 11/26/21 0431 11/27/21 [id number]  11/28/21 0259  WBC 7.3 6.2 5.5 6.6   Microbiology Recent Results (from the past 240 hour(s))  Urine Culture     Status: Abnormal   Collection Time: 11/24/21  7:17 AM   Specimen: Urine, Clean Catch  Result Value Ref Range Status   Specimen Description   Final    URINE, CLEAN CATCH Performed at [facility], [address], [place], [place] [address]    Special Requests   Final    NONE Performed at [facility], [address] [place], [place] [address]    Culture >=100,000 COLONIES/mL ENTEROCOCCUS FAECALIS (A)  Final   Report Status 11/26/2021 FINAL  Final   Organism ID, Bacteria ENTEROCOCCUS FAECALIS (A)  Final      Susceptibility   Enterococcus faecalis - MIC*    AMPICILLIN <=2 SENSITIVE Sensitive     NITROFURANTOIN <=16 SENSITIVE Sensitive     VANCOMYCIN 1 SENSITIVE Sensitive     * >=100,000 COLONIES/mL ENTEROCOCCUS FAECALIS  Culture, blood (x 2)     Status: None (Preliminary result)   Collection Time: 11/24/21  1:57 PM   Specimen: BLOOD  Result Value Ref Range Status   Specimen Description BLOOD LEFT ANTECUBITAL  Final   Special Requests   Final    BOTTLES DRAWN AEROBIC AND ANAEROBIC Blood Culture adequate volume   Culture   Final    NO GROWTH 4 DAYS Performed at [facility], [address], [place], [place] [address]    Report Status PENDING  Incomplete  Culture, blood (x 2)     Status: None (Preliminary result)   Collection Time: 11/24/21  2:23 PM   Specimen: BLOOD  Result Value Ref Range Status   Specimen Description BLOOD BLOOD RIGHT HAND  Final   Special Requests   Final    BOTTLES DRAWN AEROBIC AND ANAEROBIC Blood Culture adequate volume   Culture   Final    NO GROWTH 4 DAYS Performed at [facility], [address], [place], [place] [address]    Report Status PENDING  Incomplete     Time coordinating discharge: Over 30 minutes  SIGNED:   Wyvonnia Dusky, MD  [facility] 11/28/2021, 12:33 PM Pager   If 7PM-7AM, please contact night-coverage [email or web address]

## 2021-11-28 NOTE — Plan of Care (Signed)

## 2021-11-28 NOTE — TOC Progression Note (Signed)
Transition of Care [facility]) - Progression Note    Patient Details  Name: Cory Campbell MRN: [id number] Date of Birth: Jul 13, 1980  Transition of Care [facility]) CM/SW [place], RN Phone Number: 11/28/2021, 1:50 PM  Clinical Narrative:    EMS needed to transport home EMS Papers printed to the nurses desk, bedside nurse to place on the chart for EMS There are 5 ahead of him on EMS List    Expected Discharge Plan: Home/Self Care Barriers to Discharge: Continued Medical Work up  Expected Discharge Plan and Services Expected Discharge Plan: Home/Self Care         Expected Discharge Date: 11/28/21                                     Social Determinants of Health (SDOH) Interventions    Readmission Risk Interventions    09/11/2021   10:27 AM  Readmission Risk Prevention Plan  Transportation Screening Complete  PCP or Specialist Appt within 3-5 Days Complete  HRI or Home Care Consult Complete  Social Work Consult for [place] Planning/Counseling Complete  Palliative Care Screening Not Applicable  Medication Review Press photographer) Complete

## 2021-11-28 NOTE — Plan of Care (Signed)

## 2021-11-28 NOTE — Progress Notes (Signed)
Patient was given verbal and written discharge instructions, he acknowledge understanding and states he will comply. Waiting on EMS to transport home

## 2021-11-28 NOTE — Inpatient Diabetes Management (Addendum)
Inpatient Diabetes Program Recommendations  AACE/ADA: New Consensus Statement on Inpatient Glycemic Control   Target Ranges:  Prepandial:   less than 140 mg/dL      Peak postprandial:   less than 180 mg/dL (1-2 hours)      Critically ill patients:  140 - 180 mg/dL    Latest Reference Range & Units 11/27/21 08:56 11/27/21 11:46 11/27/21 17:36 11/27/21 20:25 11/28/21 07:50  Glucose-Capillary 70 - 99 mg/dL 112 (H) 210 (H) 391 (H) 403 (H) 245 (H)   Review of Glycemic Control  Diabetes history: DM2 Outpatient Diabetes medications: Tresiba 100 units BID, Metformin XR 1000 mg BID, Jardiance 25 mg daily, Trulicity 4.5 mg Qweek Current orders for Inpatient glycemic control: Semglee 70 units BID, Novolog 0-9 units TID with meals, Novolog 0-5 units QHS  Inpatient Diabetes Program Recommendations:    Insulin: Please consider changing Semglee to 75 units QAM, Semglee 70 units QHS, and ordering Novolog 6 units TID with meals for meal coverage if patient eats at least 50% of meals.  Thanks, Barnie Alderman, RN, MSN, [place] Diabetes Coordinator Inpatient Diabetes Program [phone] (Team Pager from 8am to [place])

## 2021-11-29 LAB — CULTURE, BLOOD (ROUTINE X 2)
Culture: NO GROWTH
Culture: NO GROWTH
Special Requests: ADEQUATE
Special Requests: ADEQUATE

## 2021-12-21 ENCOUNTER — Ambulatory Visit: Payer: Medicaid Other | Admitting: Physician Assistant

## 2021-12-24 ENCOUNTER — Other Ambulatory Visit: Payer: Self-pay

## 2021-12-24 ENCOUNTER — Emergency Department: Payer: Medicaid Other

## 2021-12-24 ENCOUNTER — Emergency Department
Admission: EM | Admit: 2021-12-24 | Discharge: 2021-12-24 | Disposition: A | Discharge status: Home / Self Care | Payer: Medicaid Other | Attending: Emergency Medicine | Admitting: Emergency Medicine

## 2021-12-24 DIAGNOSIS — I10 Essential (primary) hypertension: Secondary | ICD-10-CM | POA: Diagnosis not present

## 2021-12-24 DIAGNOSIS — R531 Weakness: Secondary | ICD-10-CM

## 2021-12-24 DIAGNOSIS — E860 Dehydration: Secondary | ICD-10-CM | POA: Insufficient documentation

## 2021-12-24 DIAGNOSIS — R109 Unspecified abdominal pain: Secondary | ICD-10-CM | POA: Insufficient documentation

## 2021-12-24 LAB — CBC
HCT: 46.5 % (ref 39.0–52.0)
Hemoglobin: 14.7 g/dL (ref 13.0–17.0)
MCH: 27.5 pg (ref 26.0–34.0)
MCHC: 31.6 g/dL (ref 30.0–36.0)
MCV: 86.9 fL (ref 80.0–100.0)
Platelets: 228 10*3/uL (ref 150–400)
RBC: 5.35 MIL/uL (ref 4.22–5.81)
RDW: 13.6 % (ref 11.5–15.5)
WBC: 5.4 10*3/uL (ref 4.0–10.5)
nRBC: 0 % (ref 0.0–0.2)

## 2021-12-24 LAB — URINALYSIS, ROUTINE W REFLEX MICROSCOPIC
Bacteria, UA: NONE SEEN
Bilirubin Urine: NEGATIVE
Glucose, UA: >=500 mg/dL — AB
Hgb urine dipstick: NEGATIVE
Ketones, ur: NEGATIVE mg/dL
Leukocytes,Ua: NEGATIVE
Nitrite: NEGATIVE
Protein, ur: 30 mg/dL — AB
Specific Gravity, Urine: 1.026 (ref 1.005–1.030)
pH: 6 (ref 5.0–8.0)

## 2021-12-24 LAB — BASIC METABOLIC PANEL
Anion gap: 13 (ref 5–15)
BUN: 21 mg/dL — ABNORMAL HIGH (ref 6–20)
CO2: 26 mmol/L (ref 22–32)
Calcium: 10.3 mg/dL (ref 8.9–10.3)
Chloride: 102 mmol/L (ref 98–111)
Creatinine, Ser: 1.26 mg/dL — ABNORMAL HIGH (ref 0.61–1.24)
GFR, Estimated: >60 mL/min (ref >60)
Glucose, Bld: 156 mg/dL — ABNORMAL HIGH (ref 70–99)
Potassium: 4 mmol/L (ref 3.5–5.1)
Sodium: 141 mmol/L (ref 135–145)

## 2021-12-24 LAB — TROPONIN I (HIGH SENSITIVITY): Troponin I (High Sensitivity): 6 ng/L (ref <18)

## 2021-12-24 LAB — HEPATIC FUNCTION PANEL
ALT: 16 U/L (ref 0–44)
AST: 26 U/L (ref 15–41)
Albumin: 3.9 g/dL (ref 3.5–5.0)
Alkaline Phosphatase: 78 U/L (ref 38–126)
Bilirubin, Direct: <0.1 mg/dL (ref 0.0–0.2)
Total Bilirubin: 0.6 mg/dL (ref 0.3–1.2)
Total Protein: 7.3 g/dL (ref 6.5–8.1)

## 2021-12-24 MED ORDER — SODIUM CHLORIDE 0.9 % IV BOLUS
1000.0000 mL | Freq: Once | INTRAVENOUS | Status: AC
  Administered 2021-12-24: 1000 mL via INTRAVENOUS

## 2021-12-24 MED ORDER — SODIUM CHLORIDE 0.9 % IV BOLUS
500.0000 mL | Freq: Once | INTRAVENOUS | Status: AC
  Administered 2021-12-24: 500 mL via INTRAVENOUS

## 2021-12-24 MED ORDER — FOSFOMYCIN TROMETHAMINE 3 G PO PACK: 3.0000 g | PACK | ORAL | 0 refills | Status: AC

## 2021-12-24 MED ORDER — FOSFOMYCIN TROMETHAMINE 3 G PO PACK
3.0000 g | PACK | Freq: Once | ORAL | Status: AC
  Administered 2021-12-24: 3 g via ORAL
  Filled 2021-12-24: qty 3

## 2021-12-24 MED ORDER — IOHEXOL 300 MG/ML  SOLN: 100.0000 mL | Freq: Once | INTRAMUSCULAR | Status: DC | PRN

## 2021-12-24 NOTE — ED Provider Notes (Signed)
[facility] Provider Note    Event Date/Time   First MD Initiated Contact with Patient 12/24/21 1220     (approximate)   History   Weakness   HPI  Cory Campbell is a 41 y.o. male here with generalized weakness and increasing urinary frequency.  The patient states that over the last 2 days or so, he has felt generally more weak than usual.  He has had increasing urinary frequency and has actually had some episodes of wetting the bed.  He has a history of UTIs, denies any fevers.  He has had some chills today.  No known fevers.  No vomiting.  He had some generalized weakness and had difficulty walking with his crutches.  He is wheelchair-bound, but normally is able to support himself more.  Subsequent presents for evaluation.  He does note that he felt lightheaded upon standing.     Physical Exam   Triage Vital Signs: ED Triage Vitals  Enc Vitals Group     BP 12/24/21 1027 (!) 130/94     Pulse Rate 12/24/21 1027 89     Resp 12/24/21 1027 16     Temp 12/24/21 1027 98 F (36.7 C)     Temp Source 12/24/21 1027 Oral     SpO2 12/24/21 1027 99 %     Weight 12/24/21 1028 160 lb 15 oz (73 kg)     Height 12/24/21 1028 5\' 6"  (1.676 m)     Head Circumference --      Peak Flow --      Pain Score 12/24/21 1028 0     Pain Loc --      Pain Edu? --      Excl. in GC? --     Most recent vital signs: Vitals:   12/24/21 1630 12/24/21 1841  BP: (!) 168/122 (!) 168/120  Pulse:  74  Resp: 18 18  Temp:  98.1 F (36.7 C)  SpO2: 98% 98%     General: Awake, no distress.  CV:  Good peripheral perfusion.  Regular rate and rhythm. Resp:  Normal effort.  Lungs clear. Abd:  No distention.  No tenderness.  No rebound or guarding. Other:  Cranial nerves II through XII intact.  Chronic weakness of bilateral lower extremities that is at baseline.  No new focal deficits.  Alert and oriented.  Dry mucous membranes.   ED Results / Procedures / Treatments   Labs (all  labs ordered are listed, but only abnormal results are displayed) Labs Reviewed  BASIC METABOLIC PANEL - Abnormal; Notable for the following components:      Result Value   Glucose, Bld 156 (*)    BUN 21 (*)    Creatinine, Ser 1.26 (*)    All other components within normal limits  URINALYSIS, ROUTINE W REFLEX MICROSCOPIC - Abnormal; Notable for the following components:   Color, Urine YELLOW (*)    APPearance CLEAR (*)    Glucose, UA >=500 (*)    Protein, ur 30 (*)    All other components within normal limits  CBC  HEPATIC FUNCTION PANEL  TROPONIN I (HIGH SENSITIVITY)     EKG Normal sinus rhythm, trickle rate 94.  PR 152, QRS 90, QTc 422.  No acute ST elevations or depressions.  EKG evidence of acute ischemia or infarct.   RADIOLOGY Chest x-ray: Clear lungs CT abdomen/pelvis: Circumferential bladder wall thickening, possible cystitis, otherwise chronic changes  I also independently reviewed and agree with radiologist  interpretations.   PROCEDURES:  Critical Care performed: No    MEDICATIONS ORDERED IN ED: Medications  sodium chloride 0.9 % bolus 1,000 mL (0 mLs Intravenous Stopped 12/24/21 1530)  fosfomycin (MONUROL) packet 3 g (3 g Oral Given 12/24/21 1528)  sodium chloride 0.9 % bolus 500 mL (0 mLs Intravenous Stopped 12/24/21 1713)     IMPRESSION / MDM / ASSESSMENT AND PLAN / ED COURSE  I reviewed the triage vital signs and the nursing notes.                               This patient presents to the ED for concern of generalized weakness, this involves an extensive number of treatment options, and is a complaint that carries with it a high risk of complications and morbidity.  The differential diagnosis includes UTI, dehydration, anemia, medication effect, occult pneumonia   Co morbidities that complicate the patient evaluation  Hypertension, hyperlipidemia, chronic pain, recurrent UTIs, arthrogryposis   Additional history obtained:  Additional history  obtained from  External records from outside source obtained and reviewed including reviewed prior ED visits, including admission for UTI on 10/19   Lab Tests:  I Ordered, and personally interpreted labs.  The pertinent results include:   CBC with no leukocytosis or anemia.  BMP with mild dehydration and elevated BUN to creatinine compared to normal.  LFTs normal.  Urinalysis largely unremarkable.   Imaging Studies ordered:  I ordered imaging studies including chest x-ray, CT head, CT abdomen I independently visualized and interpreted imaging which showed: CT head negative.  CT abdomen/pelvis shows chronic UTI, possible focal bowel wall prominence that has been noted previously, chest x-ray is clear. I agree with the radiologist interpretation   Cardiac Monitoring: / EKG:  The patient was maintained on a cardiac monitor.  I personally viewed and interpreted the cardiac monitored which showed an underlying rhythm of: Normal sinus rhythm    Problem List / ED Course / Critical interventions / Medication management  Weakness Pt endorses sx consistent with his prior UTIs, for which he has had unremarkable urines but +Cx and recurrent admissions. Imaging suggests UTI as well, though urine unremarkable  Broad work-up otherwise very reassuring Pt also dehydrated clinically and with labs c/w this - IVF given here Otherwise no acute emergent pathology Discussed management options with pt - culture, empiric tx, etc. GIven his h/o recurrent UTIs with similar presentations, he would prefer management which I think is reasonable given his history and findings on CT. Will tx based on his prior cultures and allergies. Incidental note made of rectal tissue prominence - not seen on exam, likely chronic, exam is not consistent with proctitis nor is imaging No focal neuro deficits I have reviewed the patients home medicines and have made adjustments as needed   Test / Admission - Considered:  Pt  at baseline, feels better with fluids, stable - will d/c home   FINAL CLINICAL IMPRESSION(S) / ED DIAGNOSES   Final diagnoses:  Dehydration  Generalized weakness     Rx / DC Orders   ED Discharge Orders          Ordered    fosfomycin (MONUROL) 3 g PACK  Every 3 DAYS        12/24/21 1521             Note:  This document was prepared using Dragon voice recognition software and may include unintentional dictation errors.  Shaune Pollack, MD 12/25/21 [phone]

## 2021-12-24 NOTE — ED Triage Notes (Signed)
Pt in via EMS from home with c/o tremors in his hands. EMS reports the tremors have stopped. Pt also reports has not felt well since Wednesday. 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 Allergic to pcn. HR 95, 100% RA, 148/102, CBG 145. 98.1 temp. Pt has not taken any of his meds today

## 2021-12-24 NOTE — ED Triage Notes (Signed)
Pt presents via EMS c/o "shaking" and weakness since Wednesday. Reports works at [employer] and had to leave work due to weakness.

## 2021-12-24 NOTE — Discharge Instructions (Signed)
Your labs showed dehydration and a possible urine infection.  For your urine, take the antibiotic 3 days from now and again in another 3 days - this is for recurrent UTI and should help with your symptoms.  Drink plenty of fluids.  Monitor your blood sugars and avoid foods high in sugar or carbohydrates as high blood sugar can contribute to your urinary symptoms

## 2022-01-10 NOTE — Progress Notes (Unsigned)
Cardiology Office Note    Date:  01/10/2022   ID:  Cory Campbell, DOB Oct 07, 1980, MRN [id number]  PCP:  Donnie Coffin, MD  Cardiologist:  Ida Rogue, MD  Electrophysiologist:  None   Chief Complaint: ***  History of Present Illness:   Cory Campbell is a 41 y.o. 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  Echo in 10/2016 showed an EF of 50 to 55%, normal wall motion, and no significant valvular abnormalities.  Lexiscan MPI in 01/2017 showed a small defect of moderate severity present in the apical inferior location felt to be likely due to diaphragm attenuation.  LVEF 55 to 65%.  Overall, this was a normal and low risk study.  48-hour Holter in 01/2017 showed sinus rhythm with rare PVCs.  ABIs normal bilaterally in 06/2020 and again in 09/2021.  Lower extremity ultrasound negative for DVT bilaterally in 06/2021.  He was last seen in the office in 07/2021 and was without symptoms of angina or decompensation.  ***   Labs independently reviewed: 12/2021 - albumin 3.9, AST/ALT normal, Hgb 14.7, PLT 228, potassium 4.0, BUN 21, serum creatinine 1.26 11/2021 - magnesium 2.0 09/2021 - A1c 7.5 02/2021 - TC 94, TG 182, HDL 24, LDL 34 09/2019 - TSH 0.535  Past Medical History:  Diagnosis Date   (HFpEF) heart failure with preserved ejection fraction (HCC)    Acid reflux    Adopted    Anxiety    Arthritis    Arthrogryposis    Asthma    BPH (benign prostatic hyperplasia)    Bronchitis    Chronic pain syndrome    Depressive disorder, not elsewhere classified    Diabetes mellitus without  complication ([place])    Diverticulitis of colon (without mention of hemorrhage)(562.11)    ED (erectile dysfunction)    Herpes genitalis    HIV infection ([place])    HTN (hypertension)    Hyperlipidemia    Hypertensive cardiomyopathy ([place])    Hypogonadism in male    OAB (overactive bladder)    Other psoriasis    Seizure disorder ([place])    Sleep apnea    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 or other cause, without mention of thyrotoxic crisis or storm    hyperthyroidism    Past Surgical History:  Procedure Laterality Date   APPENDECTOMY     COLOSTOMY     CORNEAL TRANSPLANT     feet surgery     both   HAND SURGERY     left and right   leg surgery     left and right   ORTHOPEDIC SURGERY     hands, feet, knees, legs   tubes in ears     both    Current Medications: No outpatient medications have been marked as taking for the 01/17/22 encounter (Appointment) with Rise Mu, PA-C.    Allergies:   Penicillins and Erythromycin base   Social History   Socioeconomic History   Marital status: Married    Spouse name: Not on file   Number of children: Not on file   Years of education: Not on file   Highest education level: Not on file  Occupational History  Occupation: Conservation officer, nature  Tobacco Use   Smoking status: Every Day    Types: Pipe, Cigars, E-cigarettes    Last attempt to quit: 01/2018    Years since quitting: 4.0   Smokeless tobacco: Never   Tobacco comments:    Declines patch  Vaping Use   Vaping Use: Former   Substances: Nicotine, Flavoring  Substance and Sexual Activity   Alcohol use: Not Currently    Alcohol/week: 3.0 standard drinks of alcohol    Types: 3 Cans of beer per week   Drug use: No   Sexual activity: Not on file  Other Topics Concern   Not on file  Social History Narrative   Uses crutches to ambulate   Social Determinants of Health   Financial Resource Strain: Not on file  Food Insecurity: No Food Insecurity (11/24/2021)   Hunger Vital  Sign    Worried About Running Out of Food in the Last Year: Never true    Ran Out of Food in the Last Year: Never true  Transportation Needs: No Transportation Needs (11/24/2021)   PRAPARE - Administrator, Civil Service (Medical): No    Lack of Transportation (Non-Medical): No  Physical Activity: Not on file  Stress: Not on file  Social Connections: Not on file     Family History:  The patient's He was adopted. Family history is unknown by patient.  ROS:   12-point review of systems is negative unless otherwise noted in the HPI.   EKGs/Labs/Other Studies Reviewed:    Studies reviewed were summarized above. The additional studies were reviewed today:  ABIs 09/13/2021: ABI/TBIToday's ABIToday's TBIPrevious ABIPrevious TBI  +-------+-----------+-----------+------------+------------+  Right 1.04       1.02       1.08        0.98          +-------+-----------+-----------+------------+------------+  Left  1.10       1.03       1.13        0.86          +-------+-----------+-----------+------------+------------+    Bilateral ABIs and TBIs appear essentially unchanged compared to prior  study on 06/2020.    Summary:  Right: Resting right ankle-brachial index is within normal range. No  evidence of significant right lower extremity arterial disease. The right  toe-brachial index is normal.   Left: Resting left ankle-brachial index is within normal range. No  evidence of significant left lower extremity arterial disease.  __________  48-hour Holter 01/2017: Normal sinus rhythm with rare PVCs  __________  Lexiscan MPI 01/22/2017: T wave inversion was noted during stress in the I, II, aVF, V6 and V5 leads. There was no ST segment deviation noted during stress. Defect 1: There is a small defect of moderate severity present in the apical inferior location. This is likely due to diaphragm attenuation The study is normal. This is a low risk study. The  left ventricular ejection fraction is normal (55-65%). Suboptimal study due to diaphragm attenuation and extracardiac activity. __________  2D echo 10/08/2016: - Left ventricle: Distal septal apical hypokinesis. The cavity size    was normal. Systolic function was normal. The estimated ejection    fraction was in the range of 50% to 55%. Wall motion was normal;    there were no regional wall motion abnormalities.  - Atrial septum: No defect or patent foramen ovale was identified.     EKG:  EKG is ordered today.  The EKG ordered today demonstrates ***  Recent Labs: 11/24/2021: B Natriuretic Peptide 22.6 11/28/2021: Magnesium 2.0 12/24/2021: ALT 16; BUN 21; Creatinine, Ser 1.26; Hemoglobin 14.7; Platelets 228; Potassium 4.0; Sodium 141  Recent Lipid Panel    Component Value Date/Time   CHOL 163 10/07/2016 0503   TRIG 311 (H) 11/24/2021 0717   HDL 26 (L) 10/07/2016 0503   CHOLHDL 6.3 10/07/2016 0503   VLDL 25 10/07/2016 0503   LDLCALC 112 (H) 10/07/2016 0503    PHYSICAL EXAM:    VS:  There were no vitals taken for this visit.  BMI: There is no height or weight on file to calculate BMI.  Physical Exam  Wt Readings from Last 3 Encounters:  12/24/21 160 lb 15 oz (73 kg)  11/28/21 160 lb 15 oz (73 kg)  10/13/21 173 lb (78.5 kg)     ASSESSMENT & PLAN:   HTN: Blood pressure  Atypical chest pain:  HLD: LDL 34 in 02/2021 with normal AST/ALT in 12/2021.  OSA:   {Are you ordering a CV Procedure (e.g. stress test, cath, DCCV, TEE, etc)?   Press F2        :[id number]     Disposition: F/u with Dr. Rockey Situ or an APP in ***.   Medication Adjustments/Labs and Tests Ordered: Current medicines are reviewed at length with the patient today.  Concerns regarding medicines are outlined above. Medication changes, Labs and Tests ordered today are summarized above and listed in the Patient Instructions accessible in Encounters.   Signed, Christell Faith, PA-C 01/10/2022 10:12 AM     [address] [address] [place] [place], [place] [address] [phone]

## 2022-01-17 ENCOUNTER — Ambulatory Visit: Payer: Medicaid Other | Attending: Medical | Admitting: Physician Assistant

## 2022-01-17 ENCOUNTER — Encounter: Payer: Self-pay | Admitting: Physician Assistant

## 2022-01-17 VITALS — BP 100/74 | HR 98 | Ht 66.0 in | Wt 183.0 lb

## 2022-01-17 DIAGNOSIS — R0789 Other chest pain: Secondary | ICD-10-CM

## 2022-01-17 DIAGNOSIS — G4733 Obstructive sleep apnea (adult) (pediatric): Secondary | ICD-10-CM | POA: Insufficient documentation

## 2022-01-17 DIAGNOSIS — I10 Essential (primary) hypertension: Secondary | ICD-10-CM | POA: Insufficient documentation

## 2022-01-17 DIAGNOSIS — E782 Mixed hyperlipidemia: Secondary | ICD-10-CM | POA: Diagnosis not present

## 2022-01-17 NOTE — Patient Instructions (Signed)
Medication Instructions:  No changes at this time.   *If you need a refill on your cardiac medications before your next appointment, please call your pharmacy*   Lab Work: None  If you have labs (blood work) drawn today and your tests are completely normal, you will receive your results only by: MyChart Message (if you have MyChart) OR A paper copy in the mail If you have any lab test that is abnormal or we need to change your treatment, we will call you to review the results.   Testing/Procedures: None   Follow-Up: At [facility], you and your health needs are our priority.  As part of our continuing mission to provide you with exceptional heart care, we have created designated Provider Care Teams.  These Care Teams include your primary Cardiologist (physician) and Advanced Practice Providers (APPs -  Physician Assistants and Nurse Practitioners) who all work together to provide you with the care you need, when you need it.   Your next appointment:   6 month(s)  The format for your next appointment:   In Person  Provider:   Timothy Gollan, MD or Ryan Dunn, PA-C        Important Information About Sugar       

## 2022-02-03 ENCOUNTER — Emergency Department
Admission: EM | Admit: 2022-02-03 | Discharge: 2022-02-04 | Disposition: A | Discharge status: Home / Self Care | Payer: Medicaid Other | Attending: Emergency Medicine | Admitting: Emergency Medicine

## 2022-02-03 DIAGNOSIS — E860 Dehydration: Secondary | ICD-10-CM | POA: Insufficient documentation

## 2022-02-03 DIAGNOSIS — I5030 Unspecified diastolic (congestive) heart failure: Secondary | ICD-10-CM | POA: Diagnosis not present

## 2022-02-03 DIAGNOSIS — E119 Type 2 diabetes mellitus without complications: Secondary | ICD-10-CM | POA: Diagnosis not present

## 2022-02-03 DIAGNOSIS — N179 Acute kidney failure, unspecified: Secondary | ICD-10-CM | POA: Diagnosis not present

## 2022-02-03 DIAGNOSIS — Z21 Asymptomatic human immunodeficiency virus [HIV] infection status: Secondary | ICD-10-CM | POA: Diagnosis not present

## 2022-02-03 DIAGNOSIS — I110 Hypertensive heart disease with heart failure: Secondary | ICD-10-CM | POA: Insufficient documentation

## 2022-02-03 DIAGNOSIS — R5383 Other fatigue: Secondary | ICD-10-CM

## 2022-02-03 LAB — CBC
HCT: 44.8 % (ref 39.0–52.0)
Hemoglobin: 14.4 g/dL (ref 13.0–17.0)
MCH: 27.3 pg (ref 26.0–34.0)
MCHC: 32.1 g/dL (ref 30.0–36.0)
MCV: 85 fL (ref 80.0–100.0)
Platelets: 258 10*3/uL (ref 150–400)
RBC: 5.27 MIL/uL (ref 4.22–5.81)
RDW: 13.6 % (ref 11.5–15.5)
WBC: 7.3 10*3/uL (ref 4.0–10.5)
nRBC: 0 % (ref 0.0–0.2)

## 2022-02-03 LAB — BASIC METABOLIC PANEL
Anion gap: 11 (ref 5–15)
BUN: 28 mg/dL — ABNORMAL HIGH (ref 6–20)
CO2: 27 mmol/L (ref 22–32)
Calcium: 10.1 mg/dL (ref 8.9–10.3)
Chloride: 98 mmol/L (ref 98–111)
Creatinine, Ser: 1.71 mg/dL — ABNORMAL HIGH (ref 0.61–1.24)
GFR, Estimated: 51 mL/min — ABNORMAL LOW (ref >60)
Glucose, Bld: 162 mg/dL — ABNORMAL HIGH (ref 70–99)
Potassium: 4.3 mmol/L (ref 3.5–5.1)
Sodium: 136 mmol/L (ref 135–145)

## 2022-02-03 MED ORDER — LACTATED RINGERS IV BOLUS
1000.0000 mL | Freq: Once | INTRAVENOUS | Status: AC
  Administered 2022-02-04: 1000 mL via INTRAVENOUS

## 2022-02-03 NOTE — ED Triage Notes (Signed)
Pt sts that he has been feeling tired and not wanting to do anything. Pt also endorses that he has been noticing that his BP has been dropping.

## 2022-02-03 NOTE — ED Provider Notes (Signed)
[facility] Provider Note    Event Date/Time   First MD Initiated Contact with Patient 02/03/22 2336     (approximate)   History   Fatigue   HPI  Cory Campbell is a 41 y.o. 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  He has had episodes of dehydration and acute kidney injury in the past.  He presents tonight for evaluation of fatigue.  He said he just feels very tired and does not want to do anything.  He also noticed that his blood pressure has been dropping and has occasionally been below 100.  He has had no other symptoms.  He worked at his job at [employer] today but just feels very tired.  He denies chest pain, shortness of breath, nausea, vomiting, abdominal pain, and dysuria.  He has had issues with urinary tract infections in the past and is concerned that he may have 1, but he is not having any burning and has not noticed any blood in his urine.  No respiratory symptoms including nasal congestion, runny nose, sore throat, nor cough.  He thinks that he has been drinking plenty of fluids and eating enough.     Physical Exam   Triage Vital Signs: ED Triage Vitals  Enc Vitals Group     BP 02/03/22 1948 94/73     Pulse Rate 02/03/22 1948 92     Resp 02/03/22 1948 18     Temp 02/03/22 1948 98.2 F (36.8 C)     Temp Source 02/03/22 1948 Oral     SpO2 02/03/22 1948 100 %     Weight 02/03/22 1951 79.4 kg (175 lb)     Height 02/03/22 1951 1.676 m (5\' 6" )     Head Circumference --      Peak Flow --      Pain Score --      Pain Loc --      Pain Edu? --      Excl. in GC? --     Most recent vital signs: Vitals:   02/03/22 1948 02/03/22 2321  BP: 94/73 98/71  Pulse: 92 93  Resp: 18 18  Temp: 98.2 F (36.8 C) 98.5 F (36.9 C)  SpO2: 100% 94%     General: Awake, no distress.   CV:  Good peripheral perfusion.  Regular rate and rhythm, normal heart sounds. Resp:  Normal effort.  Lungs are clear to auscultation. Abd:  No distention.  No tenderness to palpation. Other:  Chronic contractures and paraplegia, normal baseline neurological status.  His lips are dry and cracked and his mucous membranes appear somewhat dry.  He is AOx3 and in relatively good spirits.   ED Results / Procedures / Treatments   Labs (all labs ordered are listed, but only abnormal results are displayed) Labs Reviewed  BASIC METABOLIC PANEL - Abnormal; Notable for the following components:      Result Value   Glucose, Bld 162 (*)    BUN 28 (*)    Creatinine, Ser 1.71 (*)    GFR, Estimated 51 (*)    All other components within normal limits  URINALYSIS, ROUTINE W REFLEX MICROSCOPIC - Abnormal; Notable for the following components:   Color, Urine YELLOW (*)    APPearance HAZY (*)    Glucose, UA >=500 (*)    Protein, ur 30 (*)  Bacteria, UA RARE (*)    All other components within normal limits  URINE CULTURE  CBC     EKG  ED ECG REPORT I, Loleta Rose, the attending physician, personally viewed and interpreted this ECG.  Date: 02/03/2022 EKG Time: 20: 03 Rate: 92 Rhythm: normal sinus rhythm QRS Axis: normal Intervals: normal ST/T Wave abnormalities: Non-specific ST segment / T-wave changes, but no clear evidence of acute ischemia. Narrative Interpretation: no definitive evidence of acute ischemia; does not meet STEMI criteria.     PROCEDURES:  Critical Care performed: No  Procedures   MEDICATIONS ORDERED IN ED: Medications  lactated ringers bolus 1,000 mL (1,000 mLs Intravenous New Bag/Given 02/04/22 0001)     IMPRESSION / MDM / ASSESSMENT AND PLAN / ED COURSE  I reviewed the triage vital signs and the nursing notes.                              Differential diagnosis includes, but is not limited to, metabolic or electrolyte abnormality, acute infectious  process such as UTI or pneumonia, dehydration, sepsis, ACS.  Patient's presentation is most consistent with acute presentation with potential threat to life or bodily function.  Vital signs are stable and within normal limits although his blood pressure is a bit on the lower side.  However he has relatively minor symptoms with the main symptom being fatigue.  I looked back through the medical record and see that he had a very similar visit about a month ago where he was found to have a mild AKI and be dehydrated and felt better after fluids.  I also see that he saw a cardiologist a couple of months ago and that he saw his primary care provider within the last week.  That was at [facility], and I looked at the lab results and note and his creatinine at that time was 1.2.  Tonight's lab results are notable mostly for a creatinine of 1.7 and a BUN of 28 indicating volume depletion.  This is most likely why he is a little bit soft on his blood pressure as well.  I talked with him about this and he agrees with the plan for a liter of IV fluids and reassessment.  I anticipate he will feel better and be appropriate for discharge and he is in agreement with this plan and does not want to stay in the hospital.  I encouraged outpatient rehydration and close follow-up with his PCP and he agrees with the plan.    Clinical Course as of 02/04/22 0137  Sat Feb 04, 2022  0055 Urinalysis, Routine w reflex microscopic(!) No indication of infection.  Urine culture is pending, I will not begin empiric treatment.  I reviewed the last urine culture which was negative. [CF]  0136 Patient is sleeping comfortably after IV fluids.  I woke him up and he said he feels better and is ready to go home.  I will discharge with recommendations as previously described. [CF]    Clinical Course User Index [CF] Loleta Rose, MD     FINAL CLINICAL IMPRESSION(S) / ED DIAGNOSES   Final diagnoses:  Acute kidney injury (HCC)  Dehydration   Other fatigue     Rx / DC Orders   ED Discharge Orders     None        Note:  This document was prepared using Dragon voice recognition software and may include unintentional dictation errors.  Loleta Rose, MD 02/04/22 [phone]

## 2022-02-03 NOTE — ED Triage Notes (Signed)
Pt comes via EMs from home with c/o notting feeling good. Pt states he is just tired for last couple days. Pt is wheelchair bound. VSS

## 2022-02-04 LAB — URINALYSIS, ROUTINE W REFLEX MICROSCOPIC
Bilirubin Urine: NEGATIVE
Glucose, UA: >=500 mg/dL — AB
Hgb urine dipstick: NEGATIVE
Ketones, ur: NEGATIVE mg/dL
Leukocytes,Ua: NEGATIVE
Nitrite: NEGATIVE
Protein, ur: 30 mg/dL — AB
Specific Gravity, Urine: 1.021 (ref 1.005–1.030)
pH: 5 (ref 5.0–8.0)

## 2022-02-04 NOTE — ED Notes (Signed)
E-signature pad unavailable - Pt verbalized understanding of D/C information - no additional concerns at this time.  

## 2022-02-04 NOTE — ED Notes (Signed)
[facility] called for transport to pt's home  

## 2022-02-04 NOTE — Discharge Instructions (Signed)
You have been seen today in the Emergency Department (ED)  for fatigue.  Your workup including labs and EKG show generally reassuring results, but your symptoms may be due to dehydration, so it is important that you drink plenty of non-alcoholic fluids.  Please call your regular doctor as soon as possible to schedule the next available clinic appointment to follow up with him/her regarding your visit to the ED and your symptoms.  Return to the Emergency Department (ED)  if you have any further syncopal episodes (pass out again) or develop ANY chest pain, pressure, tightness, trouble breathing, sudden sweating, or other symptoms that concern you.

## 2022-02-06 LAB — URINE CULTURE: Culture: >=100000 — AB

## 2022-02-07 NOTE — Progress Notes (Signed)
ED Antimicrobial Stewardship Positive Culture Follow Up   Cory Campbell is an 42 y.o. male who presented to [facility] on 02/03/2022 with a chief complaint of  Chief Complaint  Patient presents with   Fatigue    Recent Results (from the past 720 hour(s))  Urine Culture     Status: Abnormal   Collection Time: 02/04/22 12:02 AM   Specimen: Urine, Clean Catch  Result Value Ref Range Status   Specimen Description   Final    URINE, CLEAN CATCH Performed at [facility], [address], [place], [place] [address]    Special Requests   Final    NONE Performed at [facility], [address], [place], [place] [address]    Culture >=100,000 COLONIES/mL STAPHYLOCOCCUS LUGDUNENSIS (A)  Final   Report Status 02/06/2022 FINAL  Final   Organism ID, Bacteria STAPHYLOCOCCUS LUGDUNENSIS (A)  Final      Susceptibility   Staphylococcus lugdunensis - MIC*    CIPROFLOXACIN <=0.5 SENSITIVE Sensitive     GENTAMICIN <=0.5 SENSITIVE Sensitive     NITROFURANTOIN <=16 SENSITIVE Sensitive     OXACILLIN 1 SENSITIVE Sensitive     TETRACYCLINE <=1 SENSITIVE Sensitive     VANCOMYCIN <=0.5 SENSITIVE Sensitive     TRIMETH/SULFA <=10 SENSITIVE Sensitive     CLINDAMYCIN <=0.25 SENSITIVE Sensitive     RIFAMPIN <=0.5 SENSITIVE Sensitive     Inducible Clindamycin NEGATIVE Sensitive     * >=100,000 COLONIES/mL STAPHYLOCOCCUS LUGDUNENSIS    [x]  Patient discharged originally without antimicrobial agent and treatment is now indicated  New antibiotic prescription: Macrobid 100 mg po BID x 5 days  ED Provider: Chesley Noon, MD  1/2: Called patient and left message to call back.  Called in Rx to [facility] in [place], [place].   1/4: Attempted to call patient @ 1501 and left message for him to call back [phone].  Spoke with patient and went over prescription and duration of treatment.   Barrie Folk ,[facility] Clinical Pharmacist  02/07/2022, 4:07 PM

## 2022-02-12 ENCOUNTER — Ambulatory Visit
Admission: EM | Admit: 2022-02-12 | Discharge: 2022-02-12 | Disposition: A | Discharge status: Home / Self Care | Payer: Medicaid Other | Attending: Emergency Medicine | Admitting: Emergency Medicine

## 2022-02-12 DIAGNOSIS — B009 Herpesviral infection, unspecified: Secondary | ICD-10-CM | POA: Diagnosis not present

## 2022-02-12 MED ORDER — ACYCLOVIR 400 MG PO TABS: 400.0000 mg | ORAL_TABLET | Freq: Three times a day (TID) | ORAL | 0 refills | Status: DC

## 2022-02-12 NOTE — Discharge Instructions (Addendum)
Symptoms are most likely today related to your herpes  Acyclovir has been refilled, continue to take as directed  Use ice over blistering for additional comfort, using 10 to 15-minute intervals  May place calamine lotion over blisters to help soothe the skin, may use as needed, this medicine may be found over-the-counter  May follow-up with your primary doctor for any further concerns

## 2022-02-12 NOTE — ED Triage Notes (Signed)
Pt c/o rash.  Pt was given antibiotics - nitrofurantoin mono 100mg  - 3 days ago for a UTI and states that he now has a rash along his privates.  Pt has genital herpes and states that it could be herpes and pt is out of the acyclovir.

## 2022-02-12 NOTE — ED Provider Notes (Signed)
[facility]    CSN: [id number] Arrival date & time: 02/12/22  1506      History   Chief Complaint Chief Complaint  Patient presents with   Rash   Herpes Zoster    HPI Cory Campbell is a 42 y.o. male.   Patient presents requesting refill of acyclovir for herpes outbreak to the genitalia, endorses symptoms have been present for 2 days, started after taking Macrobid for urinary infection.  Past Medical History:  Diagnosis Date   (HFpEF) heart failure with preserved ejection fraction (HCC)    Acid reflux    Adopted    Anxiety    Arthritis    Arthrogryposis    Asthma    BPH (benign prostatic hyperplasia)    Bronchitis    Chronic pain syndrome    Depressive disorder, not elsewhere classified    Diabetes mellitus without complication ([place])    Diverticulitis of colon (without mention of hemorrhage)(562.11)    ED (erectile dysfunction)    Herpes genitalis    HIV infection ([place])    HTN (hypertension)    Hyperlipidemia    Hypertensive cardiomyopathy ([place])    Hypogonadism in male    OAB (overactive bladder)    Other psoriasis    Seizure disorder ([place])    Sleep apnea    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 or other cause, without mention of thyrotoxic crisis or storm    hyperthyroidism    Patient Active Problem List   Diagnosis Date Noted   UTI (urinary tract infection) 11/24/2021   Proctitis 11/24/2021   Diabetes mellitus without complication ([place]) 10/93/2355   Hypokalemia 11/24/2021   Pancreatitis 11/24/2021   Asthma 11/24/2021   Depression with anxiety 11/24/2021   Overweight (BMI 25.0-29.9) 11/24/2021   BPH (benign prostatic hyperplasia) 11/24/2021   Chronic diastolic CHF (congestive heart failure) ([place]) 11/24/2021   Elevated lactic acid level 11/24/2021   Pedal edema 09/12/2021   Bilateral foot pain 09/09/2021   Lactic acidosis 09/09/2021   Tobacco dependence 09/09/2021   Weakness    AKI (acute kidney injury) ([place]) 06/05/2021   Postural  dizziness with presyncope 06/05/2021   Arthrogryposis, with contractures and functional paraplegia    Seizure disorder ([place])    Headache    Acute asthma exacerbation 03/30/2020   Acute respiratory failure with hypoxia ([place]) 03/29/2020   Surgical wound, non healing    Acute colitis 05/20/2017   Colitis    Acute renal failure ([place]) 05/15/2017   Diarrhea 05/15/2017   Sepsis ([place]) 05/15/2017   Abdominal pain 11/25/2016   HIV (human immunodeficiency virus infection) ([place]) 11/19/2016   Hypotension 10/08/2016   Unstable angina ([place]) 10/06/2016   Shortness of breath 06/23/2015   Obesity 05/18/2014   Frequent falls 08/12/2013   Pain of left clavicle 08/12/2013   Tachycardia 01/21/2013   Chronic pain syndrome 08/12/2012   Sleep apnea 06/07/2012   Chest pain 06/20/2011   Hypertension associated with diabetes ([place]) 06/20/2011   Insulin dependent type 2 diabetes mellitus ([place]) 06/20/2011   Diabetes mellitus ([place]) 06/20/2011   Asthma, chronic, unspecified asthma severity, with acute exacerbation 01/18/2008   Hyperlipidemia associated with type 2 diabetes mellitus ([place]) 08/28/2006   Essential hypertension 06/27/2005    Past Surgical History:  Procedure Laterality Date   APPENDECTOMY     COLOSTOMY     CORNEAL TRANSPLANT     feet surgery     both   HAND SURGERY     left and right   leg surgery  left and right   ORTHOPEDIC SURGERY     hands, feet, knees, legs   tubes in ears     both       Home Medications    Prior to Admission medications   Medication Sig Start Date End Date Taking? Authorizing Provider  albuterol (PROVENTIL HFA;VENTOLIN HFA) 108 (90 BASE) MCG/ACT inhaler Inhale 2 puffs into the lungs every 4 (four) hours as needed for wheezing or shortness of breath.    Yes [provider]  albuterol (PROVENTIL) (2.5 MG/3ML) 0.083% nebulizer solution Take 2.5 mg by nebulization every 4 (four) hours as needed for wheezing or shortness of breath.   Yes [provider]  amLODipine (NORVASC) 10 MG tablet Take 10 mg by mouth daily.   Yes [provider]  aspirin EC 81 MG tablet Take 81 mg by mouth daily. Swallow whole.   Yes [provider]  BIKTARVY 50-200-25 MG TABS tablet Take 1 tablet by mouth daily. 11/21/16  Yes [provider]  buprenorphine (BUTRANS) 7.5 MCG/HR Place 7.5 mg onto the skin once a week. Saturday   Yes [provider]  buPROPion (WELLBUTRIN XL) 150 MG 24 hr tablet Take 150 mg by mouth daily. 08/11/21  Yes [provider]  cetirizine (ZYRTEC) 10 MG tablet Take 10 mg by mouth daily.   Yes [provider]  citalopram (CELEXA) 20 MG tablet Take 20 mg by mouth daily.   Yes [provider]  cloNIDine (CATAPRES - DOSED IN MG/24 HR) 0.3 mg/24hr patch 0.3 mg once a week. 06/13/21  Yes [provider]  diclofenac sodium (VOLTAREN) 1 % GEL Apply 2-4 g topically 4 (four) times daily as needed. For pain.   Yes [provider]  dicyclomine (BENTYL) 10 MG capsule Take 20 mg by mouth 2 (two) times daily before a meal. 05/01/21  Yes [provider]  Elastic Bandages & Supports (T.E.D. BELOW KNEE/MEDIUM) MISC 1 each by Does not apply route daily. Measure legs and give appropriate size. 09/12/21  Yes Calvert Cantor, MD  empagliflozin (JARDIANCE) 25 MG TABS tablet Take 25 mg by mouth daily. 01/19/20  Yes [provider]  famotidine (PEPCID) 20 MG tablet Take 20 mg by mouth daily.  01/03/16  Yes [provider]  feeding supplement, GLUCERNA SHAKE, (GLUCERNA SHAKE) LIQD Take 237 mLs by mouth 4 (four) times daily.   Yes [provider]  finasteride (PROSCAR) 5 MG tablet Take 5 mg by mouth daily.   Yes [provider]  fluticasone (FLONASE) 50 MCG/ACT nasal spray Place 2 sprays into both nostrils daily.   Yes [provider]  fluticasone (FLOVENT HFA) 110 MCG/ACT inhaler Inhale 2 puffs into the lungs 2 (two) times daily.   Yes  [provider]  glucose 4 GM chewable tablet Chew 1 tablet by mouth as needed for low blood sugar.   Yes [provider]  insulin degludec (TRESIBA) 100 UNIT/ML FlexTouch Pen Inject 100 Units into the skin 2 (two) times daily.   Yes [provider]  ipratropium (ATROVENT) 0.06 % nasal spray Place 2 sprays into both nostrils 4 (four) times daily. 08/29/21  Yes Becky Augusta, NP  isosorbide mononitrate (IMDUR) 30 MG 24 hr tablet Take 30 mg by mouth daily. 07/01/21  Yes [provider]  ketoconazole (NIZORAL) 2 % shampoo Apply 1 application topically 2 (two) times a week. tues and thursday 08/27/14  Yes [provider]  lisinopril (ZESTRIL) 40 MG tablet Take 40 mg by  mouth daily.   Yes [provider]  Melatonin 5 MG TABS Take 10 mg by mouth at bedtime as needed (sleep).   Yes [provider]  metFORMIN (GLUCOPHAGE-XR) 500 MG 24 hr tablet Take 1,000 mg by mouth 2 (two) times daily.   Yes [provider]  metoprolol tartrate (LOPRESSOR) 100 MG tablet TAKE 1 AND 1/2 TABLETS(150 MG) BY MOUTH TWICE DAILY 10/31/21  Yes Gollan, Tollie Pizza, MD  mirtazapine (REMERON) 45 MG tablet Take 45 mg by mouth at bedtime.   Yes [provider]  Multiple Vitamin (MULTIVITAMIN) tablet Take 1 tablet by mouth daily.   Yes [provider]  naproxen (NAPROSYN) 500 MG tablet Take 500 mg by mouth 2 (two) times daily. 09/08/21  Yes [provider]  nitroGLYCERIN (NITROSTAT) 0.4 MG SL tablet PLACE 1 TABLET SUBLINGUALLY EVERY 5 MINUTES AS NEEDED FOR CHEST PAIN. CALL 911 IF YOU HAVE TAKEN 3 NITRO FOR CHEST PAIN 09/21/21  Yes Gollan, Tollie Pizza, MD  nortriptyline (PAMELOR) 25 MG capsule Take 50 mg by mouth at bedtime.   Yes [provider]  omega-3 acid ethyl esters (LOVAZA) 1 g capsule Take 1 g by mouth 3 (three) times daily.   Yes [provider]  omeprazole (PRILOSEC) 20 MG capsule Take 20 mg by mouth daily. 09/08/21  Yes  [provider]  ondansetron (ZOFRAN-ODT) 4 MG disintegrating tablet Take 4 mg by mouth every 8 (eight) hours as needed for nausea or vomiting.   Yes [provider]  polyethylene glycol (MIRALAX / GLYCOLAX) 17 g packet Take 17 g by mouth 3 (three) times daily.   Yes [provider]  rosuvastatin (CRESTOR) 40 MG tablet TAKE 1 TABLET(40 MG) BY MOUTH DAILY 04/30/19  Yes Gollan, Tollie Pizza, MD  solifenacin (VESICARE) 10 MG tablet Take 10 mg by mouth daily.   Yes [provider]  Spacer/Aero-Holding Chambers (AEROCHAMBER PLUS) inhaler Use as instructed 03/23/18  Yes Domenick Gong, MD  spironolactone (ALDACTONE) 50 MG tablet Take 100 mg by mouth daily. 06/15/21  Yes [provider]  tiZANidine (ZANAFLEX) 4 MG tablet Take 1 tablet (4 mg total) by mouth 3 (three) times daily. 07/06/21 07/06/22 Yes Varney Daily, PA  TRULICITY 4.5 MG/0.5ML SOPN SMARTSIG:4.5 Milligram(s) SUB-Q Once a Week 09/23/21  Yes [provider]  acyclovir (ZOVIRAX) 400 MG tablet Take 1 tablet (400 mg total) by mouth 3 (three) times daily. 02/12/22 03/14/22  Valinda Hoar, NP  gabapentin (NEURONTIN) 300 MG capsule Take 1 capsule (300 mg total) by mouth 2 (two) times daily. Patient taking differently: Take 300 mg by mouth 3 (three) times daily. 09/12/21 11/24/21  Calvert Cantor, MD    Family History Family History  Adopted: Yes  Family history unknown: Yes    Social History Social History   Tobacco Use   Smoking status: Every Day    Types: Pipe, Cigars, E-cigarettes    Last attempt to quit: 01/2018    Years since quitting: 4.1   Smokeless tobacco: Never   Tobacco comments:    Declines patch  Vaping Use   Vaping Use: Every day   Substances: Nicotine, Flavoring  Substance Use Topics   Alcohol use: Not Currently    Alcohol/week: 3.0 standard drinks of alcohol    Types: 3 Cans of beer per week   Drug use: No     Allergies   Penicillins and Erythromycin  base   Review of Systems Review of Systems  Constitutional: Negative.   Respiratory:  Negative.    Cardiovascular: Negative.   Skin:  Positive for rash. Negative for color change, pallor and wound.  Neurological: Negative.      Physical Exam Triage Vital Signs ED Triage Vitals  Enc Vitals Group     BP 02/12/22 1600 122/87     Pulse Rate 02/12/22 1600 74     Resp 02/12/22 1600 18     Temp 02/12/22 1600 98.1 F (36.7 C)     Temp Source 02/12/22 1600 Oral     SpO2 02/12/22 1600 100 %     Weight 02/12/22 1556 175 lb (79.4 kg)     Height 02/12/22 1556 5\' 6"  (1.676 m)     Head Circumference --      Peak Flow --      Pain Score 02/12/22 1556 9     Pain Loc --      Pain Edu? --      Excl. in [place]? --    No data found.  Updated Vital Signs BP 122/87 (BP Location: Left Arm)   Pulse 74   Temp 98.1 F (36.7 C) (Oral)   Resp 18   Ht 5\' 6"  (1.676 m)   Wt 175 lb (79.4 kg)   SpO2 100%   BMI 28.25 kg/m   Visual Acuity Right Eye Distance:   Left Eye Distance:   Bilateral Distance:    Right Eye Near:   Left Eye Near:    Bilateral Near:     Physical Exam Constitutional:      Appearance: Normal appearance.  Eyes:     Extraocular Movements: Extraocular movements intact.  Pulmonary:     Effort: Pulmonary effort is normal.  Genitourinary:    Comments: deferred Neurological:     Mental Status: He is alert and oriented to person, place, and time. Mental status is at baseline.      UC Treatments / Results  Labs (all labs ordered are listed, but only abnormal results are displayed) Labs Reviewed - No data to display  EKG   Radiology No results found.  Procedures Procedures (including critical care time)  Medications Ordered in UC Medications - No data to display  Initial Impression / Assessment and Plan / UC Course  I have reviewed the triage vital signs and the nursing notes.  Pertinent labs & imaging results that were available during my care of the  patient were reviewed by me and considered in my medical decision making (see chart for details).  Herpes simplex  Patient has known history, current outbreak therefore medication refilled and advised to continue taking as directed, may use supportive measures for management of blistering such as ice and calamine lotion, may follow-up with urgent care as needed Final Clinical Impressions(s) / UC Diagnoses   Final diagnoses:  Herpes simplex     Discharge Instructions      Symptoms are most likely today related to your herpes  Acyclovir has been refilled, continue to take as directed  Use ice over blistering for additional comfort, using 10 to 15-minute intervals  May place calamine lotion over blisters to help soothe the skin, may use as needed, this medicine may be found over-the-counter  May follow-up with your primary doctor for any further concerns   ED Prescriptions     Medication Sig Dispense Auth. Provider   acyclovir (ZOVIRAX) 400 MG tablet Take 1 tablet (400 mg total) by mouth 3 (three) times daily. 90 tablet 04/13/22, NP      PDMP  not reviewed this encounter.   Valinda Hoar, NP 02/12/22 1627

## 2022-02-19 ENCOUNTER — Other Ambulatory Visit: Payer: Self-pay | Admitting: Cardiovascular Disease

## 2022-02-24 ENCOUNTER — Other Ambulatory Visit: Payer: Self-pay

## 2022-02-24 DIAGNOSIS — N179 Acute kidney failure, unspecified: Secondary | ICD-10-CM | POA: Insufficient documentation

## 2022-02-24 DIAGNOSIS — R1084 Generalized abdominal pain: Secondary | ICD-10-CM | POA: Diagnosis present

## 2022-02-24 DIAGNOSIS — N3020 Other chronic cystitis without hematuria: Secondary | ICD-10-CM | POA: Diagnosis not present

## 2022-02-24 LAB — COMPREHENSIVE METABOLIC PANEL
ALT: 15 U/L (ref 0–44)
AST: 25 U/L (ref 15–41)
Albumin: 4 g/dL (ref 3.5–5.0)
Alkaline Phosphatase: 71 U/L (ref 38–126)
Anion gap: 12 (ref 5–15)
BUN: 24 mg/dL — ABNORMAL HIGH (ref 6–20)
CO2: 28 mmol/L (ref 22–32)
Calcium: 10.3 mg/dL (ref 8.9–10.3)
Chloride: 98 mmol/L (ref 98–111)
Creatinine, Ser: 1.84 mg/dL — ABNORMAL HIGH (ref 0.61–1.24)
GFR, Estimated: 46 mL/min — ABNORMAL LOW (ref >60)
Glucose, Bld: 114 mg/dL — ABNORMAL HIGH (ref 70–99)
Potassium: 3.8 mmol/L (ref 3.5–5.1)
Sodium: 138 mmol/L (ref 135–145)
Total Bilirubin: 0.7 mg/dL (ref 0.3–1.2)
Total Protein: 7.6 g/dL (ref 6.5–8.1)

## 2022-02-24 LAB — CBC
HCT: 46.8 % (ref 39.0–52.0)
Hemoglobin: 15.1 g/dL (ref 13.0–17.0)
MCH: 27 pg (ref 26.0–34.0)
MCHC: 32.3 g/dL (ref 30.0–36.0)
MCV: 83.6 fL (ref 80.0–100.0)
Platelets: 236 10*3/uL (ref 150–400)
RBC: 5.6 MIL/uL (ref 4.22–5.81)
RDW: 14.5 % (ref 11.5–15.5)
WBC: 5.9 10*3/uL (ref 4.0–10.5)
nRBC: 0 % (ref 0.0–0.2)

## 2022-02-24 LAB — LIPASE, BLOOD: Lipase: 42 U/L (ref 11–51)

## 2022-02-24 NOTE — ED Triage Notes (Signed)
Pt presents to ED via [facility] c/o LLQ abd pain. Pt states it started a couple days ago but gpt worse today. Denies any vomiting, diarrhea, constipation. Last BM was today. Denies CP, SOB , fevers. NAD at this time

## 2022-02-25 ENCOUNTER — Emergency Department: Payer: Medicaid Other

## 2022-02-25 ENCOUNTER — Emergency Department
Admission: EM | Admit: 2022-02-25 | Discharge: 2022-02-25 | Disposition: A | Discharge status: Home / Self Care | Payer: Medicaid Other | Attending: Emergency Medicine | Admitting: Emergency Medicine

## 2022-02-25 DIAGNOSIS — N390 Urinary tract infection, site not specified: Secondary | ICD-10-CM

## 2022-02-25 DIAGNOSIS — R1084 Generalized abdominal pain: Secondary | ICD-10-CM

## 2022-02-25 DIAGNOSIS — N179 Acute kidney failure, unspecified: Secondary | ICD-10-CM

## 2022-02-25 DIAGNOSIS — N3020 Other chronic cystitis without hematuria: Secondary | ICD-10-CM

## 2022-02-25 LAB — URINALYSIS, ROUTINE W REFLEX MICROSCOPIC
Bilirubin Urine: NEGATIVE
Glucose, UA: >=500 mg/dL — AB
Ketones, ur: 5 mg/dL — AB
Leukocytes,Ua: NEGATIVE
Nitrite: NEGATIVE
Protein, ur: >=300 mg/dL — AB
Specific Gravity, Urine: 1.029 (ref 1.005–1.030)
pH: 5 (ref 5.0–8.0)

## 2022-02-25 MED ORDER — SULFAMETHOXAZOLE-TRIMETHOPRIM 800-160 MG PO TABS: 2.0000 | ORAL_TABLET | Freq: Two times a day (BID) | ORAL | 0 refills | Status: DC

## 2022-02-25 MED ORDER — SULFAMETHOXAZOLE-TRIMETHOPRIM 800-160 MG PO TABS
1.0000 | ORAL_TABLET | Freq: Once | ORAL | Status: AC
  Administered 2022-02-25: 1 via ORAL
  Filled 2022-02-25: qty 1

## 2022-02-25 MED ORDER — LACTATED RINGERS IV BOLUS
1000.0000 mL | Freq: Once | INTRAVENOUS | Status: AC
  Administered 2022-02-25: 1000 mL via INTRAVENOUS

## 2022-02-25 MED ORDER — DICYCLOMINE HCL 10 MG PO CAPS
20.0000 mg | ORAL_CAPSULE | Freq: Once | ORAL | Status: AC
  Administered 2022-02-25: 20 mg via ORAL
  Filled 2022-02-25: qty 2

## 2022-02-25 MED ORDER — MORPHINE SULFATE (PF) 4 MG/ML IV SOLN
4.0000 mg | Freq: Once | INTRAVENOUS | Status: AC
  Administered 2022-02-25: 4 mg via INTRAVENOUS
  Filled 2022-02-25: qty 1

## 2022-02-25 NOTE — ED Notes (Signed)
[facility] called to transport pt to his home

## 2022-02-25 NOTE — ED Provider Notes (Signed)
[facility] Provider Note    Event Date/Time   First MD Initiated Contact with Patient 02/25/22 0024     (approximate)   History   Abdominal Pain   HPI  Cory Campbell is a 42 y.o. 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 He has had episodes of dehydration and acute kidney injury in the past.   He presents tonight for evaluation of generalized abdominal pain.  He said that is been going on for a few days but feels worse tonight.  It is mostly in the left lower part of his abdomen.  He has not had any vomiting or diarrhea and said he has had regular bowel movements including 1 earlier today.  He has had no fever or chills.  Denies chest pain or shortness of breath.  He has a history of abdominal pain and chronic pain syndrome but he said that this feels a little bit different.  He has had extensive abdominal surgeries in the past.     Physical Exam   Triage Vital Signs: ED Triage Vitals  Enc Vitals Group     BP 02/24/22 2027 (!) 124/94     Pulse Rate 02/24/22 2027 94     Resp 02/24/22 2027 18     Temp 02/24/22 2027 97.9 F (36.6 C)     Temp Source 02/24/22 2027 Oral     SpO2 02/24/22 2027 99 %     Weight 02/24/22 2027 79.4 kg (175 lb)     Height 02/24/22 2027 1.676 m (5\' 6" )     Head Circumference --      Peak Flow --      Pain Score 02/24/22 2038 9     Pain Loc --      Pain Edu? --      Excl. in [place]? --     Most recent vital signs: Vitals:   02/25/22 0400 02/25/22 0430  BP: 111/81 105/73  Pulse: 93 96  Resp: 11 19  Temp:    SpO2: 100% 99%     General: Awake, alert.  Appears chronically ill but nontoxic. CV:  Good peripheral perfusion.  Normal rate and rhythm. Resp:  Normal effort.  Abd:  Extensive scar scarring from prior surgeries.  Mild generalized tenderness to  palpation throughout the abdomen but better with distraction.  No focal tenderness, no rebound or guarding, no distention. Other:  Chronic contractures and lower extremity paraplegia, baseline neurological status.  Lips are somewhat dry and cracked and he does.  Bit dry.  He is AO x 3.   ED Results / Procedures / Treatments   Labs (all labs ordered are listed, but only abnormal results are displayed) Labs Reviewed  COMPREHENSIVE METABOLIC PANEL - Abnormal; Notable for the following components:      Result Value   Glucose, Bld 114 (*)    BUN 24 (*)    Creatinine, Ser 1.84 (*)    GFR, Estimated 46 (*)    All other components within normal limits  URINALYSIS, ROUTINE W REFLEX MICROSCOPIC - Abnormal; Notable for the following components:   Color, Urine AMBER (*)    APPearance CLOUDY (*)    Glucose, UA >=500 (*)    Hgb urine dipstick SMALL (*)    Ketones, ur 5 (*)    Protein, ur >=300 (*)    Bacteria,  UA FEW (*)    All other components within normal limits  URINE CULTURE  LIPASE, BLOOD  CBC    RADIOLOGY I viewed and interpreted the patient's CT abdomen/pelvis.  No indication of SBO or ileus.  Radiologist commented about circumferential thickening of the bladder wall consistent with chronic or possibly acute on chronic cystitis but no other acute abnormalities.    PROCEDURES:  Critical Care performed: No  .1-3 Lead EKG Interpretation  Performed by: Loleta Rose, MD Authorized by: Loleta Rose, MD     Interpretation: normal     ECG rate:  85   ECG rate assessment: normal     Rhythm: sinus rhythm     Ectopy: none     Conduction: normal      MEDICATIONS ORDERED IN ED: Medications  sulfamethoxazole-trimethoprim (BACTRIM DS) 800-160 MG per tablet 1 tablet (has no administration in time range)  lactated ringers bolus 1,000 mL (0 mLs Intravenous Stopped 02/25/22 0319)  dicyclomine (BENTYL) capsule 20 mg (20 mg Oral Given 02/25/22 0212)  morphine (PF) 4 MG/ML injection 4 mg  (4 mg Intravenous Given 02/25/22 0212)     IMPRESSION / MDM / ASSESSMENT AND PLAN / ED COURSE  I reviewed the triage vital signs and the nursing notes.                              Differential diagnosis includes, but is not limited to, SBO/ileus, UTI/pyelonephritis, renal/ureteral colic, acute intra-abdominal infection such as appendicitis or diverticulitis.  Patient's presentation is most consistent with acute presentation with potential threat to life or bodily function.  Labs/studies ordered: CMP, lipase, CBC, urinalysis, urine culture, CT of the abdomen and pelvis without contrast.  The patient is on the cardiac monitor to evaluate for evidence of arrhythmia and/or significant heart rate changes.  Labs are notable for a slight AKI demonstrated by creatinine of 1.8 and a slight elevation of BUN.  This is similar to when I saw him last month.  He reports to me that he has not been eating or drinking as much as usual and he does appear clinically dry.  I ordered LR 1 L IV bolus.  I also ordered morphine 4 mg IV and Bentyl 20 mg p.o. for his pain.  He had nonspecific generalized mild tenderness to palpation.  Given his prior abdominal surgeries and the possibility of acute infection such as diverticulitis or SBO/ileus, I ordered a CT of the abdomen and pelvis without contrast.   As document above, the CT scan was generally reassuring with some suggestion of chronic cystitis but no obstruction or acute intestinal infectious process.  Urinalysis was suggestive of UTI with WBC clumps, few bacteria, WBCs, even though it is nitrite negative and without leukocytes.  I looked through his history and the microbiology results show that his last urine culture was positive for a Staphylococcus species that was pansensitive.  Assuming that he may have a similar pathogen this time, I gave him a dose of Bactrim DS in the ED and give him a prescription for Bactrim DS 1 tablet twice daily for 7 days.  I  think that his chronic pain syndrome is likely playing somewhat into the situation and I do not feel that narcotics would be beneficial to him as it would likely worsen his abdominal pain.  I recommended over-the-counter pain medications.  He will follow-up as an outpatient and I gave my usual and customary return precautions.  I considered hospitalization for this patient, particular given all of his comorbidities, but he responded well to IV fluids, he is not having any difficulty with vomiting or diarrhea and should be able to tolerate oral intake, and in between my assessments he is sleeping in the room and does not appear to be in substantial distress.  His workup is generally reassuring, and I think he can be managed safely as an outpatient.        FINAL CLINICAL IMPRESSION(S) / ED DIAGNOSES   Final diagnoses:  Generalized abdominal pain  Chronic cystitis  AKI (acute kidney injury) ([place])  Urinary tract infection without hematuria, site unspecified     Rx / DC Orders   ED Discharge Orders          Ordered    sulfamethoxazole-trimethoprim (BACTRIM DS) 800-160 MG tablet  2 times daily        02/25/22 0445             Note:  This document was prepared using Dragon voice recognition software and may include unintentional dictation errors.   Hinda Kehr, MD 02/25/22 [phone]

## 2022-02-25 NOTE — Discharge Instructions (Addendum)
You have been seen in the Emergency Department (ED) for abdominal pain.  Your evaluation did not identify a clear cause of your symptoms but was generally reassuring.    You may have a urinary tract infection so we started you on some antibiotics.  Please take the full course of treatment unless another doctor tells you otherwise.  Remember to drink plenty of fluids; your kidneys were not working quite as well as usual tonight, but if you drink plenty of fluids, that will help.  Please follow up as instructed above regarding today's emergent visit and the symptoms that are bothering you.  Return to the ED if your abdominal pain worsens or fails to improve, you develop bloody vomiting, bloody diarrhea, you are unable to tolerate fluids due to vomiting, fever greater than 101, or other symptoms that concern you.

## 2022-02-26 LAB — URINE CULTURE: Culture: 20000 — AB

## 2022-02-27 ENCOUNTER — Emergency Department
Admission: EM | Admit: 2022-02-27 | Discharge: 2022-02-27 | Disposition: A | Discharge status: Home / Self Care | Payer: Medicaid Other | Attending: Emergency Medicine | Admitting: Emergency Medicine

## 2022-02-27 ENCOUNTER — Emergency Department: Payer: Medicaid Other

## 2022-02-27 ENCOUNTER — Other Ambulatory Visit: Payer: Self-pay

## 2022-02-27 ENCOUNTER — Encounter: Payer: Self-pay | Admitting: Emergency Medicine

## 2022-02-27 DIAGNOSIS — I110 Hypertensive heart disease with heart failure: Secondary | ICD-10-CM | POA: Diagnosis not present

## 2022-02-27 DIAGNOSIS — Z21 Asymptomatic human immunodeficiency virus [HIV] infection status: Secondary | ICD-10-CM | POA: Insufficient documentation

## 2022-02-27 DIAGNOSIS — I509 Heart failure, unspecified: Secondary | ICD-10-CM | POA: Diagnosis not present

## 2022-02-27 DIAGNOSIS — R519 Headache, unspecified: Secondary | ICD-10-CM

## 2022-02-27 DIAGNOSIS — I10 Essential (primary) hypertension: Secondary | ICD-10-CM | POA: Insufficient documentation

## 2022-02-27 DIAGNOSIS — E119 Type 2 diabetes mellitus without complications: Secondary | ICD-10-CM | POA: Diagnosis not present

## 2022-02-27 LAB — CBC
HCT: 49.6 % (ref 39.0–52.0)
Hemoglobin: 16 g/dL (ref 13.0–17.0)
MCH: 27.1 pg (ref 26.0–34.0)
MCHC: 32.3 g/dL (ref 30.0–36.0)
MCV: 83.9 fL (ref 80.0–100.0)
Platelets: 264 10*3/uL (ref 150–400)
RBC: 5.91 MIL/uL — ABNORMAL HIGH (ref 4.22–5.81)
RDW: 14.6 % (ref 11.5–15.5)
WBC: 5.3 10*3/uL (ref 4.0–10.5)
nRBC: 0 % (ref 0.0–0.2)

## 2022-02-27 LAB — BASIC METABOLIC PANEL
Anion gap: 14 (ref 5–15)
BUN: 21 mg/dL — ABNORMAL HIGH (ref 6–20)
CO2: 25 mmol/L (ref 22–32)
Calcium: 10.8 mg/dL — ABNORMAL HIGH (ref 8.9–10.3)
Chloride: 96 mmol/L — ABNORMAL LOW (ref 98–111)
Creatinine, Ser: 2.05 mg/dL — ABNORMAL HIGH (ref 0.61–1.24)
GFR, Estimated: 41 mL/min — ABNORMAL LOW (ref >60)
Glucose, Bld: 79 mg/dL (ref 70–99)
Potassium: 3.8 mmol/L (ref 3.5–5.1)
Sodium: 135 mmol/L (ref 135–145)

## 2022-02-27 MED ORDER — ONDANSETRON 4 MG PO TBDP
4.0000 mg | ORAL_TABLET | Freq: Once | ORAL | Status: AC
  Administered 2022-02-27: 4 mg via ORAL
  Filled 2022-02-27: qty 1

## 2022-02-27 MED ORDER — BUTALBITAL-APAP-CAFFEINE 50-325-40 MG PO TABS
1.0000 | ORAL_TABLET | Freq: Once | ORAL | Status: AC
  Administered 2022-02-27: 1 via ORAL
  Filled 2022-02-27: qty 1

## 2022-02-27 MED ORDER — CLONIDINE HCL 0.1 MG PO TABS
0.2000 mg | ORAL_TABLET | Freq: Once | ORAL | Status: AC
  Administered 2022-02-27: 0.2 mg via ORAL
  Filled 2022-02-27: qty 2

## 2022-02-27 NOTE — Discharge Instructions (Signed)
Please continue taking your blood pressure medication as prescribed.  Follow up with primary care for recheck and medication adjustment if necessary.  Return to the ER for symptoms of concern.

## 2022-02-27 NOTE — ED Triage Notes (Signed)
Patient to ED via [facility] form home for headache. States headache has been ongoing since 10am. Currently on antibiotic for UTI.

## 2022-02-27 NOTE — ED Provider Triage Note (Signed)
Emergency Medicine Provider Triage Evaluation Note  Cory Campbell , a 42 y.o. male  was evaluated in triage.  Pt complains of HA. Started this AM. Reportedly no HX of same. Currently being treated for UTI. No abd pain.  Review of Systems  Positive: HA Negative: Vision changes, unilateral weakness, LOC  Physical Exam  BP (!) 164/130   Pulse 80   Temp 98.6 F (37 C) (Oral)   Resp 18   SpO2 95%  Gen:   Awake, no distress   Resp:  Normal effort  MSK:   Moves extremities without difficulty  Other:    Medical Decision Making  Medically screening exam initiated at 3:37 PM.  Appropriate orders placed.  Cory Campbell was informed that the remainder of the evaluation will be completed by another provider, this initial triage assessment does not replace that evaluation, and the importance of remaining in the ED until their evaluation is complete.  Labs, CT at this time   Brynda Peon 02/27/22 1540

## 2022-02-27 NOTE — ED Provider Notes (Signed)
[facility] Provider Note    Event Date/Time   First MD Initiated Contact with Patient 02/27/22 1731     (approximate)   History   Headache   HPI  Cory Campbell is a 42 y.o. male with numerous chronic medical issues including CHF, HIV, hypertension, diabetes, functional paraplegia and as listed in EMR presents to the emergency department for treatment and evaluation of headache with nausea since 10 AM.  Upon arrival to the ER via EMS it was noted that he is hypertensive.  Patient states that he has taken his medications today as prescribed.  He has taken Tylenol for his headache without any relief.  Headache is diffuse, throbbing at baseline and occasional stabbing pain.  No visual changes.  No injuries.      Physical Exam   Triage Vital Signs: ED Triage Vitals  Enc Vitals Group     BP 02/27/22 1536 (!) 164/130     Pulse Rate 02/27/22 1531 80     Resp 02/27/22 1528 18     Temp 02/27/22 1528 98.6 F (37 C)     Temp Source 02/27/22 1528 Oral     SpO2 02/27/22 1531 95 %     Weight --      Height --      Head Circumference --      Peak Flow --      Pain Score 02/27/22 1528 10     Pain Loc --      Pain Edu? --      Excl. in [place]? --     Most recent vital signs: Vitals:   02/27/22 1536 02/27/22 1824  BP: (!) 164/130 (!) 149/114  Pulse:  80  Resp:  20  Temp:    SpO2:  99%    General: Awake, no distress.  CV:  Good peripheral perfusion.  Resp:  Normal effort.  Abd:  No distention.  Other:  PERL, sclera erythematous bilaterally   ED Results / Procedures / Treatments   Labs (all labs ordered are listed, but only abnormal results are displayed) Labs Reviewed  BASIC METABOLIC PANEL - Abnormal; Notable for the following components:      Result Value   Chloride 96 (*)    BUN 21 (*)    Creatinine, Ser 2.05 (*)    Calcium 10.8 (*)    GFR, Estimated 41 (*)    All other components within normal limits  CBC - Abnormal; Notable for the  following components:   RBC 5.91 (*)    All other components within normal limits  URINALYSIS, ROUTINE W REFLEX MICROSCOPIC  CBG MONITORING, ED     EKG  Sinus rhythm, ventricular rate of 80.  Unchanged from previous   RADIOLOGY  Image and radiology report reviewed and interpreted by me. Radiology report consistent with the same.  CT head negative for acute concerns.  PROCEDURES:  Critical Care performed: No  Procedures   MEDICATIONS ORDERED IN ED:  Medications  cloNIDine (CATAPRES) tablet 0.2 mg (0.2 mg Oral Given 02/27/22 1754)  butalbital-acetaminophen-caffeine (FIORICET) 50-325-40 MG per tablet 1 tablet (1 tablet Oral Given 02/27/22 1754)  ondansetron (ZOFRAN-ODT) disintegrating tablet 4 mg (4 mg Oral Given 02/27/22 1754)     IMPRESSION / MDM / ASSESSMENT AND PLAN / ED COURSE   I have reviewed the triage note.  Differential diagnosis includes, but is not limited to, symptomatic hypertension, migraine, tension headache, medication side effect.   Patient's presentation is most consistent with  acute presentation with potential threat to life or bodily function.  43 year old male presenting to the emergency department for treatment and evaluation of headache.  See HPI for further details.  He is hypertensive. Will treat with clonidine 0.2, Fioricet, and zofran in hopes that will lower his BP and help the headache.  Labs are reassuring. Stable CRF.   Blood pressure gradually reducing.  Patient noted to be resting with his eyes closed and is wheelchair.  CT of his head is negative for acute concerns.  Plan will be to discharge him home and encourage medication compliance and follow-up with primary care.      FINAL CLINICAL IMPRESSION(S) / ED DIAGNOSES   Final diagnoses:  Hypertension, unspecified type  Acute nonintractable headache, unspecified headache type     Rx / DC Orders   ED Discharge Orders     None        Note:  This document was prepared  using Dragon voice recognition software and may include unintentional dictation errors.   Victorino Dike, FNP 02/27/22 Yevette Edwards    Lavonia Drafts, MD 02/27/22 1929

## 2022-03-02 ENCOUNTER — Inpatient Hospital Stay: Payer: Medicaid Other

## 2022-03-02 ENCOUNTER — Other Ambulatory Visit: Payer: Self-pay

## 2022-03-02 ENCOUNTER — Inpatient Hospital Stay
Admission: EM | Admit: 2022-03-02 | Discharge: 2022-03-16 | DRG: 674 | Disposition: A | Discharge status: Home / Self Care | Payer: Medicaid Other | Attending: Family Medicine | Admitting: Family Medicine

## 2022-03-02 ENCOUNTER — Emergency Department: Payer: Medicaid Other

## 2022-03-02 DIAGNOSIS — Q743 Arthrogryposis multiplex congenita: Secondary | ICD-10-CM

## 2022-03-02 DIAGNOSIS — G40909 Epilepsy, unspecified, not intractable, without status epilepticus: Secondary | ICD-10-CM

## 2022-03-02 DIAGNOSIS — E119 Type 2 diabetes mellitus without complications: Secondary | ICD-10-CM

## 2022-03-02 DIAGNOSIS — N400 Enlarged prostate without lower urinary tract symptoms: Secondary | ICD-10-CM | POA: Diagnosis present

## 2022-03-02 DIAGNOSIS — Z21 Asymptomatic human immunodeficiency virus [HIV] infection status: Secondary | ICD-10-CM | POA: Diagnosis present

## 2022-03-02 DIAGNOSIS — I959 Hypotension, unspecified: Secondary | ICD-10-CM | POA: Diagnosis present

## 2022-03-02 DIAGNOSIS — N179 Acute kidney failure, unspecified: Principal | ICD-10-CM | POA: Diagnosis present

## 2022-03-02 DIAGNOSIS — B20 Human immunodeficiency virus [HIV] disease: Secondary | ICD-10-CM | POA: Diagnosis not present

## 2022-03-02 DIAGNOSIS — E162 Hypoglycemia, unspecified: Secondary | ICD-10-CM

## 2022-03-02 DIAGNOSIS — F444 Conversion disorder with motor symptom or deficit: Secondary | ICD-10-CM | POA: Diagnosis present

## 2022-03-02 DIAGNOSIS — M25562 Pain in left knee: Secondary | ICD-10-CM | POA: Diagnosis not present

## 2022-03-02 DIAGNOSIS — G894 Chronic pain syndrome: Secondary | ICD-10-CM | POA: Diagnosis present

## 2022-03-02 DIAGNOSIS — E11649 Type 2 diabetes mellitus with hypoglycemia without coma: Secondary | ICD-10-CM | POA: Diagnosis present

## 2022-03-02 DIAGNOSIS — I447 Left bundle-branch block, unspecified: Secondary | ICD-10-CM | POA: Diagnosis present

## 2022-03-02 DIAGNOSIS — R29810 Facial weakness: Secondary | ICD-10-CM | POA: Diagnosis present

## 2022-03-02 DIAGNOSIS — E785 Hyperlipidemia, unspecified: Secondary | ICD-10-CM | POA: Diagnosis present

## 2022-03-02 DIAGNOSIS — E1122 Type 2 diabetes mellitus with diabetic chronic kidney disease: Secondary | ICD-10-CM | POA: Diagnosis present

## 2022-03-02 DIAGNOSIS — F32A Depression, unspecified: Secondary | ICD-10-CM | POA: Diagnosis present

## 2022-03-02 DIAGNOSIS — E872 Acidosis, unspecified: Secondary | ICD-10-CM | POA: Diagnosis present

## 2022-03-02 DIAGNOSIS — I132 Hypertensive heart and chronic kidney disease with heart failure and with stage 5 chronic kidney disease, or end stage renal disease: Secondary | ICD-10-CM | POA: Diagnosis present

## 2022-03-02 DIAGNOSIS — E871 Hypo-osmolality and hyponatremia: Secondary | ICD-10-CM | POA: Insufficient documentation

## 2022-03-02 DIAGNOSIS — Z79899 Other long term (current) drug therapy: Secondary | ICD-10-CM

## 2022-03-02 DIAGNOSIS — K219 Gastro-esophageal reflux disease without esophagitis: Secondary | ICD-10-CM | POA: Diagnosis present

## 2022-03-02 DIAGNOSIS — E861 Hypovolemia: Secondary | ICD-10-CM | POA: Diagnosis present

## 2022-03-02 DIAGNOSIS — E1169 Type 2 diabetes mellitus with other specified complication: Secondary | ICD-10-CM | POA: Diagnosis present

## 2022-03-02 DIAGNOSIS — M1990 Unspecified osteoarthritis, unspecified site: Secondary | ICD-10-CM | POA: Diagnosis present

## 2022-03-02 DIAGNOSIS — Z9049 Acquired absence of other specified parts of digestive tract: Secondary | ICD-10-CM

## 2022-03-02 DIAGNOSIS — Z947 Corneal transplant status: Secondary | ICD-10-CM

## 2022-03-02 DIAGNOSIS — I952 Hypotension due to drugs: Secondary | ICD-10-CM | POA: Diagnosis not present

## 2022-03-02 DIAGNOSIS — I10 Essential (primary) hypertension: Secondary | ICD-10-CM | POA: Diagnosis not present

## 2022-03-02 DIAGNOSIS — N390 Urinary tract infection, site not specified: Secondary | ICD-10-CM | POA: Diagnosis present

## 2022-03-02 DIAGNOSIS — Z794 Long term (current) use of insulin: Secondary | ICD-10-CM

## 2022-03-02 DIAGNOSIS — N186 End stage renal disease: Secondary | ICD-10-CM | POA: Diagnosis present

## 2022-03-02 DIAGNOSIS — F17210 Nicotine dependence, cigarettes, uncomplicated: Secondary | ICD-10-CM | POA: Diagnosis present

## 2022-03-02 DIAGNOSIS — R296 Repeated falls: Secondary | ICD-10-CM | POA: Diagnosis not present

## 2022-03-02 DIAGNOSIS — I5032 Chronic diastolic (congestive) heart failure: Secondary | ICD-10-CM | POA: Diagnosis present

## 2022-03-02 DIAGNOSIS — Z7982 Long term (current) use of aspirin: Secondary | ICD-10-CM

## 2022-03-02 DIAGNOSIS — N189 Chronic kidney disease, unspecified: Secondary | ICD-10-CM | POA: Diagnosis not present

## 2022-03-02 DIAGNOSIS — R54 Age-related physical debility: Secondary | ICD-10-CM | POA: Diagnosis present

## 2022-03-02 DIAGNOSIS — R531 Weakness: Secondary | ICD-10-CM | POA: Diagnosis present

## 2022-03-02 DIAGNOSIS — F418 Other specified anxiety disorders: Secondary | ICD-10-CM | POA: Diagnosis present

## 2022-03-02 DIAGNOSIS — I493 Ventricular premature depolarization: Secondary | ICD-10-CM | POA: Diagnosis not present

## 2022-03-02 DIAGNOSIS — Z7984 Long term (current) use of oral hypoglycemic drugs: Secondary | ICD-10-CM

## 2022-03-02 DIAGNOSIS — Z992 Dependence on renal dialysis: Secondary | ICD-10-CM | POA: Diagnosis not present

## 2022-03-02 DIAGNOSIS — F17290 Nicotine dependence, other tobacco product, uncomplicated: Secondary | ICD-10-CM | POA: Diagnosis not present

## 2022-03-02 DIAGNOSIS — Z880 Allergy status to penicillin: Secondary | ICD-10-CM

## 2022-03-02 DIAGNOSIS — E875 Hyperkalemia: Secondary | ICD-10-CM | POA: Diagnosis present

## 2022-03-02 DIAGNOSIS — G4733 Obstructive sleep apnea (adult) (pediatric): Secondary | ICD-10-CM | POA: Diagnosis present

## 2022-03-02 DIAGNOSIS — F17200 Nicotine dependence, unspecified, uncomplicated: Secondary | ICD-10-CM | POA: Diagnosis present

## 2022-03-02 DIAGNOSIS — F419 Anxiety disorder, unspecified: Secondary | ICD-10-CM | POA: Diagnosis present

## 2022-03-02 DIAGNOSIS — M545 Low back pain, unspecified: Secondary | ICD-10-CM | POA: Diagnosis present

## 2022-03-02 DIAGNOSIS — Z881 Allergy status to other antibiotic agents status: Secondary | ICD-10-CM

## 2022-03-02 DIAGNOSIS — Q688 Other specified congenital musculoskeletal deformities: Secondary | ICD-10-CM

## 2022-03-02 DIAGNOSIS — G4730 Sleep apnea, unspecified: Secondary | ICD-10-CM | POA: Diagnosis present

## 2022-03-02 LAB — CBC WITH DIFFERENTIAL/PLATELET
Abs Immature Granulocytes: 0.02 10*3/uL (ref 0.00–0.07)
Basophils Absolute: 0 10*3/uL (ref 0.0–0.1)
Basophils Relative: 0 %
Eosinophils Absolute: 0 10*3/uL (ref 0.0–0.5)
Eosinophils Relative: 0 %
HCT: 44.2 % (ref 39.0–52.0)
Hemoglobin: 14.9 g/dL (ref 13.0–17.0)
Immature Granulocytes: 0 %
Lymphocytes Relative: 19 %
Lymphs Abs: 1.4 10*3/uL (ref 0.7–4.0)
MCH: 27.3 pg (ref 26.0–34.0)
MCHC: 33.7 g/dL (ref 30.0–36.0)
MCV: 81.1 fL (ref 80.0–100.0)
Monocytes Absolute: 0.7 10*3/uL (ref 0.1–1.0)
Monocytes Relative: 10 %
Neutro Abs: 5.2 10*3/uL (ref 1.7–7.7)
Neutrophils Relative %: 71 %
Platelets: 261 10*3/uL (ref 150–400)
RBC: 5.45 MIL/uL (ref 4.22–5.81)
RDW: 14.6 % (ref 11.5–15.5)
WBC: 7.4 10*3/uL (ref 4.0–10.5)
nRBC: 0 % (ref 0.0–0.2)

## 2022-03-02 LAB — URINALYSIS, ROUTINE W REFLEX MICROSCOPIC
Bilirubin Urine: NEGATIVE
Glucose, UA: >=500 mg/dL — AB
Hgb urine dipstick: NEGATIVE
Ketones, ur: NEGATIVE mg/dL
Nitrite: NEGATIVE
Protein, ur: 100 mg/dL — AB
Specific Gravity, Urine: 1.014 (ref 1.005–1.030)
pH: 5 (ref 5.0–8.0)

## 2022-03-02 LAB — BASIC METABOLIC PANEL
Anion gap: 17 — ABNORMAL HIGH (ref 5–15)
Anion gap: 14 (ref 5–15)
BUN: 61 mg/dL — ABNORMAL HIGH (ref 6–20)
BUN: 61 mg/dL — ABNORMAL HIGH (ref 6–20)
CO2: 19 mmol/L — ABNORMAL LOW (ref 22–32)
CO2: 17 mmol/L — ABNORMAL LOW (ref 22–32)
Calcium: 9.2 mg/dL (ref 8.9–10.3)
Calcium: 8.9 mg/dL (ref 8.9–10.3)
Chloride: 90 mmol/L — ABNORMAL LOW (ref 98–111)
Chloride: 93 mmol/L — ABNORMAL LOW (ref 98–111)
Creatinine, Ser: 4.9 mg/dL — ABNORMAL HIGH (ref 0.61–1.24)
Creatinine, Ser: 5 mg/dL — ABNORMAL HIGH (ref 0.61–1.24)
GFR, Estimated: 14 mL/min — ABNORMAL LOW (ref >60)
GFR, Estimated: 14 mL/min — ABNORMAL LOW (ref >60)
Glucose, Bld: 74 mg/dL (ref 70–99)
Glucose, Bld: 180 mg/dL — ABNORMAL HIGH (ref 70–99)
Potassium: 6.1 mmol/L — ABNORMAL HIGH (ref 3.5–5.1)
Potassium: 5 mmol/L (ref 3.5–5.1)
Sodium: 126 mmol/L — ABNORMAL LOW (ref 135–145)
Sodium: 124 mmol/L — ABNORMAL LOW (ref 135–145)

## 2022-03-02 LAB — PROCALCITONIN: Procalcitonin: 0.55 ng/mL

## 2022-03-02 LAB — GLUCOSE, CAPILLARY
Glucose-Capillary: 66 mg/dL — ABNORMAL LOW (ref 70–99)
Glucose-Capillary: 145 mg/dL — ABNORMAL HIGH (ref 70–99)

## 2022-03-02 LAB — LACTIC ACID, PLASMA
Lactic Acid, Venous: 5.9 mmol/L (ref 0.5–1.9)
Lactic Acid, Venous: 4.6 mmol/L (ref 0.5–1.9)

## 2022-03-02 LAB — CBG MONITORING, ED
Glucose-Capillary: 79 mg/dL (ref 70–99)
Glucose-Capillary: 245 mg/dL — ABNORMAL HIGH (ref 70–99)
Glucose-Capillary: 69 mg/dL — ABNORMAL LOW (ref 70–99)
Glucose-Capillary: 76 mg/dL (ref 70–99)
Glucose-Capillary: 174 mg/dL — ABNORMAL HIGH (ref 70–99)
Glucose-Capillary: 243 mg/dL — ABNORMAL HIGH (ref 70–99)
Glucose-Capillary: 280 mg/dL — ABNORMAL HIGH (ref 70–99)
Glucose-Capillary: 233 mg/dL — ABNORMAL HIGH (ref 70–99)
Glucose-Capillary: 113 mg/dL — ABNORMAL HIGH (ref 70–99)

## 2022-03-02 LAB — COMPREHENSIVE METABOLIC PANEL
ALT: 16 U/L (ref 0–44)
AST: 24 U/L (ref 15–41)
Albumin: 3.9 g/dL (ref 3.5–5.0)
Alkaline Phosphatase: 72 U/L (ref 38–126)
Anion gap: 17 — ABNORMAL HIGH (ref 5–15)
BUN: 62 mg/dL — ABNORMAL HIGH (ref 6–20)
CO2: 19 mmol/L — ABNORMAL LOW (ref 22–32)
Calcium: 9.4 mg/dL (ref 8.9–10.3)
Chloride: 89 mmol/L — ABNORMAL LOW (ref 98–111)
Creatinine, Ser: 5.05 mg/dL — ABNORMAL HIGH (ref 0.61–1.24)
GFR, Estimated: 14 mL/min — ABNORMAL LOW (ref >60)
Glucose, Bld: 88 mg/dL (ref 70–99)
Potassium: 6.2 mmol/L — ABNORMAL HIGH (ref 3.5–5.1)
Sodium: 125 mmol/L — ABNORMAL LOW (ref 135–145)
Total Bilirubin: 0.7 mg/dL (ref 0.3–1.2)
Total Protein: 7.3 g/dL (ref 6.5–8.1)

## 2022-03-02 MED ORDER — HEPARIN SODIUM (PORCINE) 5000 UNIT/ML IJ SOLN
5000.0000 [IU] | Freq: Three times a day (TID) | INTRAMUSCULAR | Status: DC
  Administered 2022-03-02 – 2022-03-13 (×27): 5000 [IU] via SUBCUTANEOUS
  Filled 2022-03-02 (×27): qty 1

## 2022-03-02 MED ORDER — SODIUM CHLORIDE 0.9 % IV SOLN
500.0000 mg | INTRAVENOUS | Status: AC
  Administered 2022-03-02 – 2022-03-06 (×5): 500 mg via INTRAVENOUS
  Filled 2022-03-02 (×4): qty 5
  Filled 2022-03-02: qty 500

## 2022-03-02 MED ORDER — NITROGLYCERIN 0.4 MG SL SUBL: 0.4000 mg | SUBLINGUAL_TABLET | SUBLINGUAL | Status: DC | PRN

## 2022-03-02 MED ORDER — DEXTROSE 10 % IV SOLN: INTRAVENOUS | Status: DC

## 2022-03-02 MED ORDER — ALBUTEROL SULFATE (2.5 MG/3ML) 0.083% IN NEBU: 2.5000 mg | INHALATION_SOLUTION | RESPIRATORY_TRACT | Status: DC | PRN

## 2022-03-02 MED ORDER — INSULIN ASPART 100 UNIT/ML IJ SOLN: 5.0000 [IU] | Freq: Once | INTRAMUSCULAR | Status: DC

## 2022-03-02 MED ORDER — INSULIN ASPART 100 UNIT/ML IJ SOLN: 0.0000 [IU] | Freq: Every day | INTRAMUSCULAR | Status: DC

## 2022-03-02 MED ORDER — ACYCLOVIR 200 MG PO CAPS
400.0000 mg | ORAL_CAPSULE | Freq: Three times a day (TID) | ORAL | Status: DC
  Administered 2022-03-03: 400 mg via ORAL
  Filled 2022-03-02: qty 2

## 2022-03-02 MED ORDER — DEXTROSE 50 % IV SOLN: 12.5000 g | INTRAVENOUS | Status: AC

## 2022-03-02 MED ORDER — DEXTROSE 50 % IV SOLN
INTRAVENOUS | Status: AC
  Administered 2022-03-02: 50 mL
  Filled 2022-03-02: qty 50

## 2022-03-02 MED ORDER — DEXTROSE 50 % IV SOLN
1.0000 | Freq: Once | INTRAVENOUS | Status: AC
  Administered 2022-03-02: 50 mL via INTRAVENOUS
  Filled 2022-03-02: qty 50

## 2022-03-02 MED ORDER — CALCIUM GLUCONATE-NACL 1-0.675 GM/50ML-% IV SOLN
1.0000 g | Freq: Once | INTRAVENOUS | Status: AC
  Administered 2022-03-02: 1000 mg via INTRAVENOUS
  Filled 2022-03-02: qty 50

## 2022-03-02 MED ORDER — DEXTROSE-NACL 5-0.9 % IV SOLN: INTRAVENOUS | Status: DC

## 2022-03-02 MED ORDER — ACETAMINOPHEN 325 MG PO TABS
650.0000 mg | ORAL_TABLET | Freq: Four times a day (QID) | ORAL | Status: AC | PRN
  Administered 2022-03-03 – 2022-03-07 (×8): 650 mg via ORAL
  Filled 2022-03-02 (×8): qty 2

## 2022-03-02 MED ORDER — ACETAMINOPHEN 650 MG RE SUPP: 650.0000 mg | Freq: Four times a day (QID) | RECTAL | Status: AC | PRN

## 2022-03-02 MED ORDER — ASPIRIN 81 MG PO TBEC
81.0000 mg | DELAYED_RELEASE_TABLET | Freq: Every day | ORAL | Status: DC
  Administered 2022-03-03 – 2022-03-07 (×5): 81 mg via ORAL
  Filled 2022-03-02 (×5): qty 1

## 2022-03-02 MED ORDER — BICTEGRAVIR-EMTRICITAB-TENOFOV 50-200-25 MG PO TABS
1.0000 | ORAL_TABLET | Freq: Every day | ORAL | Status: DC
  Administered 2022-03-03 – 2022-03-09 (×7): 1 via ORAL
  Filled 2022-03-02 (×7): qty 1

## 2022-03-02 MED ORDER — ONDANSETRON HCL 4 MG/2ML IJ SOLN
4.0000 mg | Freq: Four times a day (QID) | INTRAMUSCULAR | Status: AC | PRN
  Administered 2022-03-05 – 2022-03-06 (×4): 4 mg via INTRAVENOUS
  Filled 2022-03-02 (×4): qty 2

## 2022-03-02 MED ORDER — ROSUVASTATIN CALCIUM 10 MG PO TABS
10.0000 mg | ORAL_TABLET | Freq: Every day | ORAL | Status: DC
  Administered 2022-03-02 – 2022-03-15 (×14): 10 mg via ORAL
  Filled 2022-03-02 (×10): qty 1
  Filled 2022-03-02: qty 0.5
  Filled 2022-03-02 (×4): qty 1

## 2022-03-02 MED ORDER — VANCOMYCIN HCL 1750 MG/350ML IV SOLN
1750.0000 mg | Freq: Once | INTRAVENOUS | Status: AC
  Administered 2022-03-02: 1750 mg via INTRAVENOUS
  Filled 2022-03-02: qty 350

## 2022-03-02 MED ORDER — ONDANSETRON HCL 4 MG PO TABS
4.0000 mg | ORAL_TABLET | Freq: Four times a day (QID) | ORAL | Status: AC | PRN
  Administered 2022-03-03: 4 mg via ORAL
  Filled 2022-03-02: qty 1

## 2022-03-02 MED ORDER — METRONIDAZOLE 500 MG/100ML IV SOLN
500.0000 mg | Freq: Two times a day (BID) | INTRAVENOUS | Status: DC
  Administered 2022-03-02 – 2022-03-05 (×6): 500 mg via INTRAVENOUS
  Filled 2022-03-02 (×6): qty 100

## 2022-03-02 MED ORDER — SODIUM ZIRCONIUM CYCLOSILICATE 10 G PO PACK
10.0000 g | PACK | Freq: Once | ORAL | Status: AC
  Administered 2022-03-02: 10 g via ORAL
  Filled 2022-03-02: qty 1

## 2022-03-02 MED ORDER — VANCOMYCIN VARIABLE DOSE PER UNSTABLE RENAL FUNCTION (PHARMACIST DOSING): Status: DC

## 2022-03-02 MED ORDER — SODIUM CHLORIDE 0.9 % IV SOLN
2.0000 g | INTRAVENOUS | Status: DC
  Administered 2022-03-02 – 2022-03-04 (×3): 2 g via INTRAVENOUS
  Filled 2022-03-02 (×4): qty 12.5

## 2022-03-02 MED ORDER — INSULIN ASPART 100 UNIT/ML IV SOLN
5.0000 [IU] | Freq: Once | INTRAVENOUS | Status: AC
  Administered 2022-03-02: 5 [IU] via INTRAVENOUS
  Filled 2022-03-02: qty 0.05

## 2022-03-02 MED ORDER — SODIUM CHLORIDE 0.9 % IV BOLUS
500.0000 mL | Freq: Once | INTRAVENOUS | Status: AC
  Administered 2022-03-02: 500 mL via INTRAVENOUS

## 2022-03-02 MED ORDER — NICOTINE 21 MG/24HR TD PT24: 21.0000 mg | MEDICATED_PATCH | Freq: Every day | TRANSDERMAL | Status: DC | PRN

## 2022-03-02 MED ORDER — LORAZEPAM 2 MG/ML IJ SOLN: 2.0000 mg | INTRAMUSCULAR | Status: DC | PRN

## 2022-03-02 MED ORDER — HYDRALAZINE HCL 20 MG/ML IJ SOLN: 5.0000 mg | Freq: Three times a day (TID) | INTRAMUSCULAR | Status: AC | PRN

## 2022-03-02 MED ORDER — INSULIN ASPART 100 UNIT/ML IJ SOLN
0.0000 [IU] | Freq: Three times a day (TID) | INTRAMUSCULAR | Status: DC
  Administered 2022-03-02: 5 [IU] via SUBCUTANEOUS
  Administered 2022-03-03 – 2022-03-05 (×2): 2 [IU] via SUBCUTANEOUS
  Filled 2022-03-02 (×5): qty 1

## 2022-03-02 MED ORDER — SENNOSIDES-DOCUSATE SODIUM 8.6-50 MG PO TABS: 1.0000 | ORAL_TABLET | Freq: Every evening | ORAL | Status: DC | PRN

## 2022-03-02 MED ORDER — MELATONIN 5 MG PO TABS
10.0000 mg | ORAL_TABLET | Freq: Every evening | ORAL | Status: DC | PRN
  Administered 2022-03-04 – 2022-03-15 (×8): 10 mg via ORAL
  Filled 2022-03-02 (×8): qty 2

## 2022-03-02 MED ORDER — SODIUM BICARBONATE 8.4 % IV SOLN
INTRAVENOUS | Status: AC
  Filled 2022-03-02: qty 150

## 2022-03-02 NOTE — ED Notes (Signed)
BG is 76 at this time, pt given more orange juice to drink. Will re-asses BG again.  Pt is alert and oriented at this time.

## 2022-03-02 NOTE — Assessment & Plan Note (Signed)
-  CPAP nightly ordered 

## 2022-03-02 NOTE — ED Provider Notes (Signed)
[facility] Provider Note   Event Date/Time   First MD Initiated Contact with Patient 03/02/22 1135     (approximate) History  Hypoglycemia  HPI Cory Campbell is a 42 y.o. male who presents via EMS with complaints of generalized weakness and recently diagnosed with a UTI.  EMS found patient's blood glucose to be 79 that increased to 109.  Patient just endorses significant generalized weakness over the last 2-3 days ROS: Patient currently denies any vision changes, tinnitus, difficulty speaking, facial droop, sore throat, chest pain, shortness of breath, abdominal pain, nausea/vomiting/diarrhea, dysuria, or numbness/paresthesias in any extremity   Physical Exam  Triage Vital Signs: ED Triage Vitals  Enc Vitals Group     BP 03/02/22 1018 111/80     Pulse Rate 03/02/22 1018 88     Resp 03/02/22 1207 14     Temp 03/02/22 1018 98.1 F (36.7 C)     Temp Source 03/02/22 1018 Oral     SpO2 03/02/22 1018 100 %     Weight 03/02/22 1209 174 lb (78.9 kg)     Height 03/02/22 1209 5\' 6"  (1.676 m)     Head Circumference --      Peak Flow --      Pain Score 03/02/22 1207 0     Pain Loc --      Pain Edu? --      Excl. in [place]? --    Most recent vital signs: Vitals:   03/02/22 1345 03/02/22 1531  BP: 108/82 124/86  Pulse: 87 90  Resp: 13 15  Temp:    SpO2: 98% 100%   General: Awake, oriented x4. CV:  Good peripheral perfusion.  Resp:  Normal effort.  Abd:  No distention.  Other:  Middle-aged overweight African-American male laying in bed in no acute distress ED Results / Procedures / Treatments  Labs (all labs ordered are listed, but only abnormal results are displayed) Labs Reviewed  URINALYSIS, ROUTINE W REFLEX MICROSCOPIC - Abnormal; Notable for the following components:      Result Value   Color, Urine YELLOW (*)    APPearance CLOUDY (*)    Glucose, UA >=500 (*)    Protein, ur 100 (*)    Leukocytes,Ua TRACE (*)    Bacteria, UA MANY (*)    Non  Squamous Epithelial PRESENT (*)    All other components within normal limits  COMPREHENSIVE METABOLIC PANEL - Abnormal; Notable for the following components:   Sodium 125 (*)    Potassium 6.2 (*)    Chloride 89 (*)    CO2 19 (*)    BUN 62 (*)    Creatinine, Ser 5.05 (*)    GFR, Estimated 14 (*)    Anion gap 17 (*)    All other components within normal limits  BASIC METABOLIC PANEL - Abnormal; Notable for the following components:   Sodium 126 (*)    Potassium 6.1 (*)    Chloride 90 (*)    CO2 19 (*)    BUN 61 (*)    Creatinine, Ser 4.90 (*)    GFR, Estimated 14 (*)    Anion gap 17 (*)    All other components within normal limits  LACTIC ACID, PLASMA - Abnormal; Notable for the following components:   Lactic Acid, Venous 5.9 (*)    All other components within normal limits  CBG MONITORING, ED - Abnormal; Notable for the following components:   Glucose-Capillary 69 (*)  All other components within normal limits  CBG MONITORING, ED - Abnormal; Notable for the following components:   Glucose-Capillary 245 (*)    All other components within normal limits  CBG MONITORING, ED - Abnormal; Notable for the following components:   Glucose-Capillary 174 (*)    All other components within normal limits  CBG MONITORING, ED - Abnormal; Notable for the following components:   Glucose-Capillary 243 (*)    All other components within normal limits  CBG MONITORING, ED - Abnormal; Notable for the following components:   Glucose-Capillary 280 (*)    All other components within normal limits  CBG MONITORING, ED - Abnormal; Notable for the following components:   Glucose-Capillary 233 (*)    All other components within normal limits  CULTURE, BLOOD (ROUTINE X 2)  CULTURE, BLOOD (ROUTINE X 2)  URINE CULTURE  CBC WITH DIFFERENTIAL/PLATELET  PROCALCITONIN  LACTIC ACID, PLASMA  BASIC METABOLIC PANEL  CBG MONITORING, ED  CBG MONITORING, ED   EKG ED ECG REPORT I, [name], the  attending physician, personally viewed and interpreted this ECG. Date: 03/02/2022 EKG Time: 1258 Rate: 90 Rhythm: normal sinus rhythm QRS Axis: normal Intervals: Left bundle branch block ST/T Wave abnormalities: normal Narrative Interpretation: Left bundle branch block.  No evidence of acute ischemia RADIOLOGY ED MD interpretation: One-view portable chest x-ray interpreted by me shows no evidence of acute abnormalities including no pneumonia, pneumothorax, or widened mediastinum CT of the head without contrast interpreted by me shows no evidence of acute abnormalities including no intracerebral hemorrhage, obvious masses, or significant edema -Agree with radiology assessment Official radiology report(s): DG Chest Port 1 View  Result Date: 03/02/2022 CLINICAL DATA:  [id number] Altered mental status [id number] EXAM: PORTABLE CHEST - 1 VIEW COMPARISON:  12/24/2021 FINDINGS: Cardiac silhouette is unremarkable. No pneumothorax or pleural effusion. The lungs are clear. The visualized skeletal structures are unremarkable. IMPRESSION: No acute cardiopulmonary process. Electronically Signed   By: Layla Maw M.D.   On: 03/02/2022 14:57   CT HEAD WO CONTRAST ( )  Result Date: 03/02/2022 CLINICAL DATA:  Altered mental status EXAM: CT HEAD WITHOUT CONTRAST TECHNIQUE: Contiguous axial images were obtained from the base of the skull through the vertex without intravenous contrast. RADIATION DOSE REDUCTION: This exam was performed according to the departmental dose-optimization program which includes automated exposure control, adjustment of the mA and/or kV according to patient size and/or use of iterative reconstruction technique. COMPARISON:  02/27/2022 FINDINGS: Brain: No evidence of acute infarction, hemorrhage, hydrocephalus, extra-axial collection or mass lesion/mass effect. Vascular: No hyperdense vessel or unexpected calcification. Skull: Normal. Negative for fracture or focal lesion. Sinuses/Orbits: No  acute finding. IMPRESSION: No acute intracranial process. Electronically Signed   By: Layla Maw M.D.   On: 03/02/2022 14:50   US RENAL  Result Date: 03/02/2022 CLINICAL DATA:  Acute renal failure. EXAM: RENAL / URINARY TRACT ULTRASOUND COMPLETE COMPARISON:  CT abdomen/pelvis 02/25/2022. FINDINGS: Right Kidney: Renal measurements: 12.3 x 7.4 x 6.3 = volume: 297.9. mL. Increased echogenicity. No mass or hydronephrosis. Left Kidney: Renal measurements: 10.4 x 7.3 x 6.1 = volume: 241.2 mL. Increased echogenicity. No mass or hydronephrosis. Bladder: Appears normal for degree of bladder distention. Other: None. IMPRESSION: Increased echogenicity in the bilateral kidneys consistent with medical renal disease. Electronically Signed   By: Orvan Falconer M.D.   On: 03/02/2022 13:47   PROCEDURES: Critical Care performed: Yes, see critical care procedure note(s) .1-3 Lead EKG Interpretation  Performed by: [name], MD Authorized by:  Naaman Plummer, MD     Interpretation: normal     ECG rate:  71   ECG rate assessment: normal     Rhythm: sinus rhythm     Ectopy: none     Conduction: normal   CRITICAL CARE Performed by: Naaman Plummer  Total critical care time: 33 minutes  Critical care time was exclusive of separately billable procedures and treating other patients.  Critical care was necessary to treat or prevent imminent or life-threatening deterioration.  Critical care was time spent personally by me on the following activities: development of treatment plan with patient and/or surrogate as well as nursing, discussions with consultants, evaluation of patient's response to treatment, examination of patient, obtaining history from patient or surrogate, ordering and performing treatments and interventions, ordering and review of laboratory studies, ordering and review of radiographic studies, pulse oximetry and re-evaluation of patient's condition.  MEDICATIONS ORDERED IN  ED: Medications  dextrose 5 %-0.9 % sodium chloride infusion ( Intravenous New Bag/Given 03/02/22 1457)  acetaminophen (TYLENOL) tablet 650 mg (has no administration in time range)    Or  acetaminophen (TYLENOL) suppository 650 mg (has no administration in time range)  ondansetron (ZOFRAN) tablet 4 mg (has no administration in time range)    Or  ondansetron (ZOFRAN) injection 4 mg (has no administration in time range)  heparin injection 5,000 Units (5,000 Units Subcutaneous Given 03/02/22 1457)  senna-docusate (Senokot-S) tablet 1 tablet (has no administration in time range)  insulin aspart (novoLOG) injection 0-5 Units (has no administration in time range)  insulin aspart (novoLOG) injection 0-15 Units (has no administration in time range)  LORazepam (ATIVAN) injection 2 mg (has no administration in time range)  metroNIDAZOLE (FLAGYL) IVPB 500 mg (0 mg Intravenous Stopped 03/02/22 1631)  vancomycin (VANCOREADY) IVPB 1750 mg/350 mL (has no administration in time range)  vancomycin variable dose per unstable renal function (pharmacist dosing) (has no administration in time range)  ceFEPIme (MAXIPIME) 2 g in sodium chloride 0.9 % 100 mL IVPB (has no administration in time range)  bictegravir-emtricitabine-tenofovir AF (BIKTARVY) 50-200-25 MG per tablet 1 tablet (has no administration in time range)  nitroGLYCERIN (NITROSTAT) SL tablet 0.4 mg (has no administration in time range)  rosuvastatin (CRESTOR) tablet 10 mg (has no administration in time range)  azithromycin (ZITHROMAX) 500 mg in sodium chloride 0.9 % 250 mL IVPB (has no administration in time range)  sodium chloride 0.9 % bolus 500 mL (has no administration in time range)  hydrALAZINE (APRESOLINE) injection 5 mg (has no administration in time range)  dextrose 50 % solution 50 mL (50 mLs Intravenous Given 03/02/22 1203)  calcium gluconate 1 g/ 50 mL sodium chloride IVPB (0 mg Intravenous Stopped 03/02/22 1352)  sodium zirconium  cyclosilicate (LOKELMA) packet 10 g (10 g Oral Given 03/02/22 1331)  insulin aspart (novoLOG) injection 5 Units (5 Units Intravenous Given 03/02/22 1504)    And  dextrose 50 % solution 50 mL (50 mLs Intravenous Given 03/02/22 1509)  calcium gluconate 1 g/ 50 mL sodium chloride IVPB (0 mg Intravenous Stopped 03/02/22 1618)   IMPRESSION / MDM / ASSESSMENT AND PLAN / ED COURSE  I reviewed the triage vital signs and the nursing notes.                             The patient is on the cardiac monitor to evaluate for evidence of arrhythmia and/or significant heart rate changes. Patient's presentation is  most consistent with acute presentation with potential threat to life or bodily function.  This patient presents to the ED for concern of hypoglycemia, this involves an extensive number of treatment options, and is a complaint that carries with it a high risk of complications and morbidity.  The differential diagnosis includes medication nonadherence, insulin overdose, sepsis Co morbidities that complicate the patient evaluation  Type 2 diabetes Additional history obtained:  Additional history obtained from EMR  External records from outside source obtained and reviewed including emergency department visit for hypertension on 02/27/2022 Lab Tests:  I Ordered, and personally interpreted labs.  The pertinent results include:  Creatinine increased from 2-5 over 3 days Imaging Studies ordered:  I ordered imaging studies including chest x-ray, head CT  I independently visualized and interpreted imaging which showed no evidence of acute abnormalities  I agree with the radiologist interpretation Cardiac Monitoring: / EKG:  The patient was maintained on a cardiac monitor.  I personally viewed and interpreted the cardiac monitored which showed an underlying rhythm of: Normal sinus rhythm Consultations Obtained:  I requested consultation with the nephrologist Dr. Zollie Scale and hospitalist Dr. Tobie Poet,  and  discussed lab and imaging findings as well as pertinent plan - they recommend: D5 NS drip Problem List / ED Course / Critical interventions / Medication management  Hypoglycemia, acute renal failure, hyperkalemia  I ordered medication including D50, D5 NS, calcium gluconate, Lokelma for hypoglycemia and hyperkalemia  Reevaluation of the patient after these medicines showed that the patient improved  I have reviewed the patients home medicines and have made adjustments as needed Dispo: Admit to medicine   FINAL CLINICAL IMPRESSION(S) / ED DIAGNOSES   Final diagnoses:  Acute renal failure, unspecified acute renal failure type ([place])  Hypoglycemia   Rx / DC Orders   ED Discharge Orders     None      Note:  This document was prepared using Dragon voice recognition software and may include unintentional dictation errors.   Naaman Plummer, MD 03/02/22 [phone]

## 2022-03-02 NOTE — Progress Notes (Signed)
Hypoglycemic Event  CBG: 66  Treatment: D50 25 mL (12.5 gm)  Symptoms: None  Follow-up CBG: Time:2300 CBG Result:145  Possible Reasons for Event: Inadequate meal intake  Comments/MD notified:Brenda Randol Kern NP    Christophe Louis

## 2022-03-02 NOTE — Assessment & Plan Note (Signed)
-  Fall precaution

## 2022-03-02 NOTE — Assessment & Plan Note (Addendum)
-  Presumed secondary to acute on chronic kidney disease, complicated by elevated lactic acid - Sodium bicarb 150 mill equivalent and dextrose 5%, 100 mL/h, 10 hours ordered - BMP in the a.m.

## 2022-03-02 NOTE — Consult Note (Addendum)
Pharmacy Antibiotic Note  Cory Campbell is a 42 y.o. male admitted on 03/02/2022 with sepsis.  Pharmacy has been consulted for vancomycin and cefepime dosing.  Plan: Cefepime 2g IV every 24 hours Vancomycin 1750mg  IV x1 followed by vancomycin variable dosing protocol Check random vancomycin level tomorrow morning with AM labs.  Follow nephrology progress notes on plan for HD. Initiate hemodialysis vanc protocol once patient on HD  Height: 5\' 6"  (167.6 cm) Weight: 78.9 kg (174 lb) IBW/kg (Calculated) : 63.8  Temp (24hrs), Avg:98.1 F (36.7 C), Min:98.1 F (36.7 C), Max:98.1 F (36.7 C)  Recent Labs  Lab 02/24/22 2035 02/27/22 1536 03/02/22 1208 03/02/22 1400 03/02/22 1431  WBC 5.9 5.3 7.4  --   --   CREATININE 1.84* 2.05* 5.05* 4.90*  --   LATICACIDVEN  --   --   --   --  5.9*    Estimated Creatinine Clearance: 19.4 mL/min (A) (by C-G formula based on SCr of 4.9 mg/dL (H)).    Allergies  Allergen Reactions   Penicillins Hives, Itching and Rash    Has patient had a PCN reaction causing immediate rash, facial/tongue/throat swelling, SOB or lightheadedness with hypotension: Yes Has patient had a PCN reaction causing severe rash involving mucus membranes or skin necrosis: No Has patient had a PCN reaction that required hospitalization No Has patient had a PCN reaction occurring within the last 10 years: No If all of the above answers are "NO", then may proceed with Cephalosporin use.  Has tolerated amoxicillin without problems   Erythromycin Base Itching and Rash    Antimicrobials this admission: 1/25 cefepime >>  1/25 vancomycin >>   Microbiology results: 1/25 BCx: sent 1/25 UCx: sent  Thank you for allowing pharmacy to be a part of this patient's care.  Darrick Penna 03/02/2022 3:22 PM

## 2022-03-02 NOTE — Progress Notes (Signed)
Hypoglycemic Event  CBG: 66  Treatment: D50 25 mL (12.5 gm)  Symptoms: Shaky  Follow-up CBG: Time:2304 CBG Result:145  Possible Reasons for Event: Inadequate meal intake  Comments/MD notified: CN notified provider    Cory Campbell A [name]

## 2022-03-02 NOTE — ED Notes (Signed)
Dr. Tobie Poet informed of the pts BG of 243.

## 2022-03-02 NOTE — Assessment & Plan Note (Signed)
-  Insulin SSI with at bedtime coverage ordered - Home long-acting insulin, insulin degludec 100 units twice daily not resumed on admission due to acute kidney injury - Goal inpatient blood glucose level is 140-180

## 2022-03-02 NOTE — Assessment & Plan Note (Signed)
Home antihypertensive medications not resumed on admission due to concerns of sepsis and hypotension at home - Amlodipine 10 mg daily, clonidine 0.3 mg patch once per week, isosorbide mononitrate 30 mg daily, lisinopril 40 mg daily, metoprolol tartrate 150 milligrams twice daily not resumed on admission - Hydralazine 5 mg IV every 8 hours as needed for SBP greater 180 ordered, 4 days - AM team to resume home antihypertensive medications when the benefits outweigh the risks

## 2022-03-02 NOTE — ED Triage Notes (Signed)
First Nurse note:  Pt BIB [facility] for being diagnosed with a UTI, but unable to grip things  79cbg went to 109 with EMS VSS, pt slept in EMS

## 2022-03-02 NOTE — Assessment & Plan Note (Addendum)
-  Etiology workup in progress - Blood cultures x 2 are in process, procalcitonin - UA, urine culture ordered - Broad-spectrum antibiotic: Vancomycin, cefepime, metronidazole - Procalcitonin was elevated, added azithromycin for atypical coverage - Second lactic acid was downtrending

## 2022-03-02 NOTE — H&P (Addendum)
History and Physical   Cory Campbell YNW:[id number] DOB: May 20, 1980 DOA: 03/02/2022  PCP: Emogene Morgan, MD  Outpatient Specialists: Dr. Sherryll Burger, ophthalmology Patient coming from: home via EMS  I have personally briefly reviewed patient's old medical records in [facility] EMR.  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  Initial vitals in the ED showed temperature of 98.1, respiration rate of 14, heart rate of 88, SpO2 100% on room air, blood pressure 111/80.  Serum sodium is 125, potassium 6.2, chloride 89, bicarb 19, BUN of 62, serum creatinine of 5.05, EGFR 14, nonfasting blood glucose 88, WBC 7.4, hemoglobin 14.9, platelets of 261  ED treatment: Lokelma 10 g p.o., dextrose 50% 1 amp, Lokelma 10 g p.o. one-time dose, calcium gluconate 1 g IV. ------------------------------- At bedside, he is able to tell me his name, age, current location, current calendar year. He endorses generalized weakness.   He denies head ache, chest pain, shortness of breath, fever, chills, nausea, vomiting, dysuria, hematuria, diarrhea.  He denies numbness, tingling. He has had poor PO intake. His spouse reports that he vomited last night and it was orange.   I called spouse and spouse reports that patient had shaking chills, and hypotension (88/63, HR 89 and then 87/51, HR 90 and then 85/59 HR 90). Spouse reports at baseline, he urinates at night and he did not urinate last night. She further endorses poor PO intake over the last couple of day.  Social history: He lives at home with his wife, of six year. He is a former tobacco user, 5 packs of cigars. He quit smoking 2 weeks ago. He reports infrequent etoh use, does not remember the last drink. He denies recreational drug use.    ROS: Constitutional: no weight change, no fever ENT/Mouth: no sore throat, no rhinorrhea Eyes: no eye pain, no vision changes Cardiovascular: no chest pain, no dyspnea,  no edema, no palpitations Respiratory: no cough, no sputum, no wheezing Gastrointestinal: no nausea, no vomiting, no diarrhea, no constipation Genitourinary: no urinary incontinence, no dysuria, no hematuria Musculoskeletal: no arthralgias, no myalgias Skin: no skin lesions, no pruritus, Neuro: + weakness, no loss of consciousness, no syncope Psych: no anxiety, no depression, + decrease appetite Heme/Lymph: no bruising, no bleeding  ED Course: Discussed with emergency medicine provider, patient requiring hospitalization for chief concerns of hyperkalemia.  Assessment/Plan  Principal Problem:   Hyperkalemia Active Problems:   Lactic acidosis   Metabolic acidosis   Hypotension   AKI (acute kidney injury) (HCC)   HIV (human immunodeficiency virus infection) (HCC)   BPH (benign prostatic hyperplasia)   Essential hypertension   Tobacco dependence   Depression with anxiety   Insulin dependent type 2 diabetes mellitus (HCC)   Sleep apnea   Frequent falls   Hyperlipidemia associated with type 2 diabetes mellitus (HCC)   Arthrogryposis, with contractures and functional paraplegia   Seizure disorder (HCC)   Weakness   Hyponatremia   Assessment and Plan:  * Hyperkalemia Electrolyte imbalance including hyponatremia, hypochloremia Metabolic acidosis - Presumed secondary to acute kidney injury - Status post Lokelma-D50 1 amp - Additional D50 1 amp ordered, insulin aspart 5 units one-time dose - One-time dose of calcium gluconate ordered  Metabolic acidosis - Presumed secondary to acute on chronic kidney disease, complicated by elevated  lactic acid - Sodium bicarb 150 mill equivalent and dextrose 5%, 100 mL/h, 10 hours ordered - BMP in the a.m.  Lactic acidosis - Etiology workup in progress - Blood  cultures x 2 are in process, procalcitonin - UA, urine culture ordered - Broad-spectrum antibiotic: Vancomycin, cefepime, metronidazole - Procalcitonin was elevated, added azithromycin for atypical coverage - Second lactic acid was downtrending  AKI (acute kidney injury) ([place]) - Presumed secondary to sepsis/SIRS with organ involvement  Hypotension - With reported chills and weakness - Check blood cultures x 2, lactic acid x 2, procalcitonin, UA with urine culture - Maintain MAP greater than 65 - Admit to PCU, inpatient  HIV (human immunodeficiency virus infection) ([place]) - Patient endorses compliance with Biktarvy - Resumed home Biktarvy for 03/03/2022  Essential hypertension Home antihypertensive medications not resumed on admission due to concerns of sepsis and hypotension at home - Amlodipine 10 mg daily, clonidine 0.3 mg patch once per week, isosorbide mononitrate 30 mg daily, lisinopril 40 mg daily, metoprolol tartrate 150 milligrams twice daily not resumed on admission - Hydralazine 5 mg IV every 8 hours as needed for SBP greater 180 ordered, 4 days - AM team to resume home antihypertensive medications when the benefits outweigh the risks  Tobacco dependence - As needed nicotine patch ordered  Hyponatremia - Sodium chloride 500 mL IV bolus one-time dose ordered  Weakness - Etiology workup in progress  Seizure disorder (HCC) - Ativan 2 mg IV as needed for seizure, 2 doses ordered  Frequent falls - Fall precaution  Sleep apnea - CPAP nightly ordered  Insulin dependent type 2 diabetes mellitus (HCC) - Insulin SSI with at bedtime coverage ordered - Home long-acting insulin, insulin degludec 100 units twice daily not resumed on admission due to acute kidney injury - Goal inpatient blood glucose level is 140-180  Right-sided facial droop-unknown if this is baseline - CT of the head without contrast ordered - Was able to confirm with spouse that this is his baseline  facial symmetry  Per spouse, patient is not reliable regarding his medications. Unknown last dosing of most medications. Patient confirms that he takes Biktarvy daily  Chart reviewed.   DVT prophylaxis: Heparin 5000 units subcutaneous Code Status: Full code Diet: N.p.o. Family Communication: Updated spouse over the phone [phone] Disposition Plan: pending clinical course, guarded prognosis Consults called: Pharmacy, nephrology Admission status: Progressive cardiac, inpatient  Past Medical History:  Diagnosis Date   (HFpEF) heart failure with preserved ejection fraction (HCC)    Acid reflux    Adopted    Anxiety    Arthritis    Arthrogryposis    Asthma    BPH (benign prostatic hyperplasia)    Bronchitis    Chronic pain syndrome    Depressive disorder, not elsewhere classified    Diabetes mellitus without complication ([place])    Diverticulitis of colon (without mention of hemorrhage)(562.11)    ED (erectile dysfunction)    Herpes genitalis    HIV infection ([place])    HTN (hypertension)    Hyperlipidemia    Hypertensive cardiomyopathy ([place])    Hypogonadism in male    OAB (overactive bladder)    Other psoriasis    Seizure disorder ([place])    Sleep apnea    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 He has never used smokeless tobacco. He reports that he does not currently use alcohol after a past usage of about 3.0 standard drinks of alcohol per week. He reports that he does not use drugs.  Allergies  Allergen Reactions   Penicillins  Hives, Itching and Rash    Has patient had a PCN reaction causing immediate rash, facial/tongue/throat swelling, SOB or lightheadedness with hypotension: Yes Has patient had a PCN reaction causing severe rash involving mucus membranes or skin necrosis: No Has patient had a PCN reaction that required hospitalization No Has patient had a PCN reaction occurring within the last 10 years: No If all of the above answers are "NO", then may proceed with Cephalosporin use.  Has tolerated amoxicillin without problems   Erythromycin Base Itching and Rash   Family History  Adopted: Yes  Family history unknown: Yes   Family history: Family history reviewed and not pertinent.  Prior to Admission medications   Medication Sig Start Date End Date Taking? Authorizing Provider  acyclovir (ZOVIRAX) 400 MG tablet Take 1 tablet (400 mg total) by mouth 3 (three) times daily. 02/12/22 03/14/22  Valinda Hoar, NP  albuterol (PROVENTIL HFA;VENTOLIN HFA) 108 (90 BASE) MCG/ACT inhaler Inhale 2 puffs into the lungs every 4 (four) hours as needed for wheezing or shortness of breath.     [provider]  albuterol (PROVENTIL) (2.5 MG/3ML) 0.083% nebulizer solution Take 2.5 mg by nebulization every 4 (four) hours as needed for wheezing or shortness of breath.    [provider]  amLODipine (NORVASC) 10 MG tablet Take 10 mg by mouth daily.    [provider]  aspirin EC 81 MG tablet Take 81 mg by mouth daily. Swallow whole.    [provider]  BIKTARVY 50-200-25 MG TABS tablet Take 1 tablet by mouth daily. 11/21/16   [provider]  buprenorphine (BUTRANS) 7.5 MCG/HR Place 7.5 mg onto the skin once a week. Saturday    [provider]  buPROPion (WELLBUTRIN XL) 150 MG 24 hr tablet Take 150 mg by mouth daily. 08/11/21   [provider]  cetirizine (ZYRTEC) 10 MG tablet Take 10 mg by mouth daily.    [provider]  citalopram (CELEXA) 20 MG tablet Take  20 mg by mouth daily.    [provider]  cloNIDine (CATAPRES - DOSED IN MG/24 HR) 0.3 mg/24hr patch 0.3 mg once a week. 06/13/21   [provider]  diclofenac sodium (VOLTAREN) 1 % GEL Apply 2-4 g topically 4 (four) times daily as needed. For pain.    [provider]  dicyclomine (BENTYL) 10 MG capsule Take 20 mg by mouth 2 (two) times daily before a meal. 05/01/21   [provider]  Elastic Bandages & Supports (T.E.D. BELOW KNEE/MEDIUM) MISC 1 each by Does not apply route daily. Measure legs and give appropriate size. 09/12/21   Calvert Cantor, MD  empagliflozin (JARDIANCE) 25 MG TABS tablet Take 25 mg by mouth daily. 01/19/20   [provider]  famotidine (PEPCID) 20 MG tablet Take 20 mg by mouth daily.  01/03/16  [provider]  feeding supplement, GLUCERNA SHAKE, (GLUCERNA SHAKE) LIQD Take 237 mLs by mouth 4 (four) times daily.    [provider]  finasteride (PROSCAR) 5 MG tablet Take 5 mg by mouth daily.    [provider]  fluticasone (FLONASE) 50 MCG/ACT nasal spray Place 2 sprays into both nostrils daily.    [provider]  fluticasone (FLOVENT HFA) 110 MCG/ACT inhaler Inhale 2 puffs into the lungs 2 (two) times daily.    [provider]  gabapentin (NEURONTIN) 300 MG capsule Take 1 capsule (300 mg total) by mouth 2 (two) times daily. Patient taking differently: Take 300 mg by mouth 3 (three) times daily. 09/12/21 11/24/21  Calvert Cantor, MD  glucose 4 GM chewable tablet Chew 1 tablet by mouth as needed for low blood sugar.    [provider]  insulin degludec (TRESIBA) 100 UNIT/ML FlexTouch Pen Inject 100 Units into the skin 2 (two) times daily.    [provider]  ipratropium (ATROVENT) 0.06 % nasal spray Place 2 sprays into both nostrils 4 (four) times daily. 08/29/21   Becky Augusta, NP  isosorbide mononitrate (IMDUR) 30 MG 24 hr tablet Take 30 mg by mouth daily. 07/01/21   [provider]  ketoconazole (NIZORAL) 2 % shampoo Apply 1 application topically 2 (two) times a week. tues and thursday 08/27/14   [provider]  lisinopril (ZESTRIL) 40 MG tablet Take 40 mg by mouth daily.    [provider]  Melatonin 5 MG TABS Take 10 mg by mouth at bedtime as needed (sleep).    [provider]  metFORMIN (GLUCOPHAGE-XR) 500 MG 24 hr tablet Take 1,000 mg by mouth 2 (two) times daily.    [provider]  metoprolol tartrate (LOPRESSOR) 100 MG tablet TAKE 1 AND 1/2 TABLETS(150 MG) BY MOUTH TWICE DAILY 10/31/21   Antonieta Iba, MD  mirtazapine (REMERON) 45 MG tablet Take 45 mg by mouth at bedtime.    [provider]  Multiple Vitamin (MULTIVITAMIN) tablet Take 1 tablet by mouth daily.    [provider]  naproxen (NAPROSYN) 500 MG tablet Take 500 mg by mouth 2 (two) times daily. 09/08/21   [provider]  nitroGLYCERIN (NITROSTAT) 0.4 MG SL tablet PLACE 1 TABLET SUBLINGUALLY EVERY 5 MINUTES AS NEEDED FOR CHEST PAIN. CALL 911 IF YOU HAVE TAKEN 3 NITRO FOR CHEST PAIN 09/21/21   Antonieta Iba, MD  nortriptyline (PAMELOR) 25 MG capsule Take 50 mg by mouth at bedtime.    [provider]  omega-3 acid ethyl esters (LOVAZA) 1 g capsule Take 1 g by mouth 3 (three) times daily.    [provider]  omeprazole (PRILOSEC) 20 MG capsule Take 20 mg by mouth daily. 09/08/21   [provider]  ondansetron (ZOFRAN-ODT) 4 MG disintegrating tablet Take 4 mg by mouth every 8 (eight) hours as needed for nausea or vomiting.    [provider]  polyethylene glycol (MIRALAX / GLYCOLAX) 17 g packet Take 17 g by mouth 3 (three) times daily.    [provider]  rosuvastatin (CRESTOR) 40 MG tablet TAKE 1 TABLET(40 MG) BY MOUTH DAILY 04/30/19   Antonieta Iba, MD  solifenacin (VESICARE) 10 MG tablet Take 10 mg by mouth daily.    [provider]  Spacer/Aero-Holding Chambers (AEROCHAMBER  PLUS) inhaler Use as instructed 03/23/18   Domenick Gong, MD  spironolactone (ALDACTONE) 50 MG tablet Take 100 mg by mouth daily. 06/15/21  [provider]  sulfamethoxazole-trimethoprim (BACTRIM DS) 800-160 MG tablet Take 2 tablets by mouth in the morning and at bedtime for 7 days. 02/25/22 03/04/22  Hinda Kehr, MD  tiZANidine (ZANAFLEX) 4 MG tablet Take 1 tablet (4 mg total) by mouth 3 (three) times daily. 07/06/21 07/06/22  Teodoro Spray, PA  TRULICITY 4.5 KK/9.3GH SOPN SMARTSIG:4.5 Milligram(s) SUB-Q Once a Week 09/23/21   [provider]   Physical Exam: Vitals:   03/02/22 1646 03/02/22 1646 03/02/22 1731 03/02/22 1842  BP: 134/88  (!) 125/90   Pulse: 90  88 91  Resp: 17  18 19   Temp:  98.1 F (36.7 C)    TempSrc:      SpO2: 95%  99% 97%  Weight:      Height:       Constitutional: appears older than chronological age, frail, NAD, calm Eyes: PERRL, lids and conjunctivae normal ENMT: Mucous membranes are moist. Posterior pharynx clear of any exudate or lesions. Age-appropriate dentition. Hearing appropriate Neck: normal, supple, no masses, no thyromegaly Respiratory: clear to auscultation bilaterally, no wheezing, no crackles. Normal respiratory effort. No accessory muscle use.  Cardiovascular: Regular rate and rhythm, no murmurs / rubs / gallops. No extremity edema. 2+ pedal pulses. No carotid bruits.  Abdomen: no tenderness, no masses palpated, no hepatosplenomegaly. Bowel sounds positive.  Musculoskeletal: no clubbing / cyanosis. No joint deformity upper and lower extremities. Good ROM, no contractures, no atrophy. Normal muscle tone.  Skin: no rashes, lesions, ulcers. No induration Neurologic: Sensation intact. Strength 5/5 in all 4.  Psychiatric: Normal judgment and insight. Alert and oriented x 3. Normal mood.   EKG: independently reviewed, showing sinus rhythm with rate of 90, QTc 437  Chest x-ray on Admission: I personally reviewed and I agree  with radiologist reading as below.  DG Chest Port 1 View  Result Date: 03/02/2022 CLINICAL DATA:  [id number] Altered mental status [id number] EXAM: PORTABLE CHEST - 1 VIEW COMPARISON:  12/24/2021 FINDINGS: Cardiac silhouette is unremarkable. No pneumothorax or pleural effusion. The lungs are clear. The visualized skeletal structures are unremarkable. IMPRESSION: No acute cardiopulmonary process. Electronically Signed   By: Sammie Bench M.D.   On: 03/02/2022 14:57   CT HEAD WO CONTRAST (5MM)  Result Date: 03/02/2022 CLINICAL DATA:  Altered mental status EXAM: CT HEAD WITHOUT CONTRAST TECHNIQUE: Contiguous axial images were obtained from the base of the skull through the vertex without intravenous contrast. RADIATION DOSE REDUCTION: This exam was performed according to the departmental dose-optimization program which includes automated exposure control, adjustment of the mA and/or kV according to patient size and/or use of iterative reconstruction technique. COMPARISON:  02/27/2022 FINDINGS: Brain: No evidence of acute infarction, hemorrhage, hydrocephalus, extra-axial collection or mass lesion/mass effect. Vascular: No hyperdense vessel or unexpected calcification. Skull: Normal. Negative for fracture or focal lesion. Sinuses/Orbits: No acute finding. IMPRESSION: No acute intracranial process. Electronically Signed   By: Sammie Bench M.D.   On: 03/02/2022 14:50   US RENAL  Result Date: 03/02/2022 CLINICAL DATA:  Acute renal failure. EXAM: RENAL / URINARY TRACT ULTRASOUND COMPLETE COMPARISON:  CT abdomen/pelvis 02/25/2022. FINDINGS: Right Kidney: Renal measurements: 12.3 x 7.4 x 6.3 = volume: 297.9. mL. Increased echogenicity. No mass or hydronephrosis. Left Kidney: Renal measurements: 10.4 x 7.3 x 6.1 = volume: 241.2 mL. Increased echogenicity. No mass or hydronephrosis. Bladder: Appears normal for degree of bladder distention. Other: None. IMPRESSION: Increased echogenicity in the bilateral kidneys  consistent with medical renal disease. Electronically Signed   By:  Orvan FalconerWalter  Wiggins M.D.   On: 03/02/2022 13:47    Labs on Admission: I have personally reviewed following labs  CBC: Recent Labs  Lab 02/24/22 2035 02/27/22 1536 03/02/22 1208  WBC 5.9 5.3 7.4  NEUTROABS  --   --  5.2  HGB 15.1 16.0 14.9  HCT 46.8 49.6 44.2  MCV 83.6 83.9 81.1  PLT 236 264 261   Basic Metabolic Panel: Recent Labs  Lab 02/24/22 2035 02/27/22 1536 03/02/22 1208 03/02/22 1400 03/02/22 1733  NA 138 135 125* 126* 124*  K 3.8 3.8 6.2* 6.1* 5.0  CL 98 96* 89* 90* 93*  CO2 28 25 19* 19* 17*  GLUCOSE 114* 79 88 74 180*  BUN 24* 21* 62* 61* 61*  CREATININE 1.84* 2.05* 5.05* 4.90* 5.00*  CALCIUM 10.3 10.8* 9.4 9.2 8.9   GFR: Estimated Creatinine Clearance: 19 mL/min (A) (by C-G formula based on SCr of 5 mg/dL (H)).  Liver Function Tests: Recent Labs  Lab 02/24/22 2035 03/02/22 1208  AST 25 24  ALT 15 16  ALKPHOS 71 72  BILITOT 0.7 0.7  PROT 7.6 7.3  ALBUMIN 4.0 3.9   Recent Labs  Lab 02/24/22 2035  LIPASE 42   CBG: Recent Labs  Lab 03/02/22 1219 03/02/22 1302 03/02/22 1342 03/02/22 1524 03/02/22 1629  GLUCAP 245* 174* 243* 280* 233*   Urine analysis:    Component Value Date/Time   COLORURINE YELLOW (A) 03/02/2022 1208   APPEARANCEUR CLOUDY (A) 03/02/2022 1208   APPEARANCEUR Cloudy 05/16/2014 0350   LABSPEC 1.014 03/02/2022 1208   LABSPEC 1.018 05/16/2014 0350   PHURINE 5.0 03/02/2022 1208   GLUCOSEU >=500 (A) 03/02/2022 1208   GLUCOSEU Negative 05/16/2014 0350   HGBUR NEGATIVE 03/02/2022 1208   BILIRUBINUR NEGATIVE 03/02/2022 1208   BILIRUBINUR Negative 05/16/2014 0350   KETONESUR NEGATIVE 03/02/2022 1208   PROTEINUR 100 (A) 03/02/2022 1208   NITRITE NEGATIVE 03/02/2022 1208   LEUKOCYTESUR TRACE (A) 03/02/2022 1208   LEUKOCYTESUR 1+ 05/16/2014 0350   CRITICAL CARE Performed by: Dr. Sedalia Mutaox  Total critical care time: 40 minutes  Critical care time was exclusive of  separately billable procedures and treating other patients.  Critical care was necessary to treat or prevent imminent or life-threatening deterioration.  Critical care was time spent personally by me on the following activities: development of treatment plan with patient and/or surrogate as well as nursing, discussions with consultants, evaluation of patient's response to treatment, examination of patient, obtaining history from patient or surrogate, ordering and performing treatments and interventions, ordering and review of laboratory studies, ordering and review of radiographic studies, pulse oximetry and re-evaluation of patient's condition.  This document was prepared using Dragon Voice Recognition software and may include unintentional dictation errors.  Dr. Sedalia Mutaox [facility]  If 7PM-7AM, please contact overnight-coverage provider If 7AM-7PM, please contact day coverage provider [email or web address]  03/02/2022, 7:06 PM

## 2022-03-02 NOTE — Assessment & Plan Note (Signed)
-  Ativan 2 mg IV as needed for seizure, 2 doses ordered

## 2022-03-02 NOTE — Assessment & Plan Note (Signed)
-  Sodium chloride 500 mL IV bolus one-time dose ordered

## 2022-03-02 NOTE — Assessment & Plan Note (Addendum)
-  Patient endorses compliance with [facility] [place] for 03/03/2022

## 2022-03-02 NOTE — Assessment & Plan Note (Signed)
-  Etiology workup in progress 

## 2022-03-02 NOTE — Assessment & Plan Note (Signed)
-  With reported chills and weakness - Check blood cultures x 2, lactic acid x 2, procalcitonin, UA with urine culture - Maintain MAP greater than 65 - Admit to PCU, inpatient

## 2022-03-02 NOTE — Assessment & Plan Note (Signed)
-  Presumed secondary to sepsis/SIRS with organ involvement

## 2022-03-02 NOTE — Hospital Course (Addendum)
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  Noted to have hyperkalemia and AKI and urinary tract infection.  Nephrology consulted workup underway as well as discussion to start dialysis.  Vascular surgery saw patient and placed temporary dialysis catheter on 1/27.  After several sessions of hemodialysis, patient showing some signs of improvement in creatinine, although still needs hemodialysis.  Vascular surgery placed PermCath placement 2/2.  Interventional radiology plans to take patient on 2/6 for renal biopsy.  2/6: Vitals stable.  Had his dialysis yesterday.  Going for renal biopsy today.  If remains stable then can be discharged home tomorrow per nephrology.  2/7; vitals with mildly elevated blood pressure at 133/93, labs with leukocytosis, most likely reactive with renal biopsy yesterday.  Potassium at 2.9 which was repleted by nephrology. Nephrology would like to continue monitoring for 1 more day and holding dialysis at this time.

## 2022-03-02 NOTE — ED Notes (Signed)
Dr. Tobie Poet informed that the pts lactic is 5.9

## 2022-03-02 NOTE — Assessment & Plan Note (Addendum)
Electrolyte imbalance including hyponatremia, hypochloremia Metabolic acidosis - Presumed secondary to acute kidney injury - Status post Lokelma-D50 1 amp - Additional D50 1 amp ordered, insulin aspart 5 units one-time dose - One-time dose of calcium gluconate ordered

## 2022-03-02 NOTE — ED Notes (Signed)
Dr. Tobie Poet is aware of the pts new lactic acid and BMP

## 2022-03-02 NOTE — Assessment & Plan Note (Signed)
-  As needed nicotine patch ordered ?

## 2022-03-03 DIAGNOSIS — E875 Hyperkalemia: Secondary | ICD-10-CM | POA: Diagnosis not present

## 2022-03-03 LAB — BASIC METABOLIC PANEL
Anion gap: 14 (ref 5–15)
BUN: 65 mg/dL — ABNORMAL HIGH (ref 6–20)
CO2: 19 mmol/L — ABNORMAL LOW (ref 22–32)
Calcium: 8.6 mg/dL — ABNORMAL LOW (ref 8.9–10.3)
Chloride: 92 mmol/L — ABNORMAL LOW (ref 98–111)
Creatinine, Ser: 5.8 mg/dL — ABNORMAL HIGH (ref 0.61–1.24)
GFR, Estimated: 12 mL/min — ABNORMAL LOW (ref >60)
Glucose, Bld: 112 mg/dL — ABNORMAL HIGH (ref 70–99)
Potassium: 5.5 mmol/L — ABNORMAL HIGH (ref 3.5–5.1)
Sodium: 125 mmol/L — ABNORMAL LOW (ref 135–145)

## 2022-03-03 LAB — GLUCOSE, CAPILLARY
Glucose-Capillary: 117 mg/dL — ABNORMAL HIGH (ref 70–99)
Glucose-Capillary: 128 mg/dL — ABNORMAL HIGH (ref 70–99)
Glucose-Capillary: 210 mg/dL — ABNORMAL HIGH (ref 70–99)
Glucose-Capillary: 81 mg/dL (ref 70–99)
Glucose-Capillary: 89 mg/dL (ref 70–99)
Glucose-Capillary: 99 mg/dL (ref 70–99)

## 2022-03-03 LAB — CBC
HCT: 38 % — ABNORMAL LOW (ref 39.0–52.0)
Hemoglobin: 12.9 g/dL — ABNORMAL LOW (ref 13.0–17.0)
MCH: 27.2 pg (ref 26.0–34.0)
MCHC: 33.9 g/dL (ref 30.0–36.0)
MCV: 80 fL (ref 80.0–100.0)
Platelets: 207 10*3/uL (ref 150–400)
RBC: 4.75 MIL/uL (ref 4.22–5.81)
RDW: 14.3 % (ref 11.5–15.5)
WBC: 6.5 10*3/uL (ref 4.0–10.5)
nRBC: 0 % (ref 0.0–0.2)

## 2022-03-03 LAB — PROCALCITONIN: Procalcitonin: 0.76 ng/mL

## 2022-03-03 LAB — VANCOMYCIN, RANDOM: Vancomycin Rm: 39 ug/mL

## 2022-03-03 LAB — MRSA NEXT GEN BY PCR, NASAL: MRSA by PCR Next Gen: NOT DETECTED

## 2022-03-03 MED ORDER — SODIUM CHLORIDE 0.9 % IV SOLN: INTRAVENOUS | Status: DC

## 2022-03-03 MED ORDER — SODIUM ZIRCONIUM CYCLOSILICATE 10 G PO PACK
10.0000 g | PACK | Freq: Three times a day (TID) | ORAL | Status: AC
  Administered 2022-03-03 (×3): 10 g via ORAL
  Filled 2022-03-03 (×3): qty 1

## 2022-03-03 MED ORDER — ACYCLOVIR 200 MG PO CAPS
200.0000 mg | ORAL_CAPSULE | Freq: Two times a day (BID) | ORAL | Status: DC
  Administered 2022-03-03 – 2022-03-16 (×26): 200 mg via ORAL
  Filled 2022-03-03 (×26): qty 1

## 2022-03-03 NOTE — Plan of Care (Signed)

## 2022-03-03 NOTE — Progress Notes (Signed)
  Progress Note   Patient: Cory Campbell RUE:[id number] DOB: 20-Dec-1980 DOA: 03/02/2022     1 DOS: the patient was seen and examined on 03/03/2022   Brief hospital course:  Assessment and Plan: * Hyperkalemia - Lokelma 10 g PO tid  - Nephrology consulted   Metabolic acidosis - Nephrology consulted  - IV NS 125 cc/hr   Lactic acidosis - IV fluids as above  - Lactic acid in AM   AKI (acute kidney injury) ([place]) - IV fluids as above  - Nephro consult pending   Hypotension - IV flagyl 500 mg q12  - IV cefepime 2 g daily  - IV azithromycin 500 mg daily  - IV NS 125 cc/hr   HIV (human immunodeficiency virus infection) ([place]) - Biktarvy 1 tablet PO daily   Essential hypertension - Monitor   Tobacco dependence - Nicotine patch 21 mg TD daily   Hyponatremia - Sodium chloride 500 mL IV bolus one-time dose ordered  Weakness - Management as above   Seizure disorder (HCC) - IV ativan 2 mg PRN   Frequent falls - Fall precaution  Sleep apnea - CPAP nightly ordered  Insulin dependent type 2 diabetes mellitus (HCC) - Novolog tid and qhs  - Crestor 10 mg PO daily   DVT prophylaxis: Heparin 5000 units sq q8hr      Subjective: Pt seen and examined at the bedside. Continue with IV fluids and antibx's. Nephrology consulted pending.  Physical Exam: Vitals:   03/03/22 0009 03/03/22 0457 03/03/22 0725 03/03/22 1126  BP: 108/79 119/84 (!) 94/58 109/73  Pulse: 93 (!) 101 90   Resp: 10 20 20 14   Temp: 98.4 F (36.9 C) 98.2 F (36.8 C) 98.2 F (36.8 C) 97.9 F (36.6 C)  TempSrc:   Axillary Axillary  SpO2: 91% 91%  90%  Weight:      Height:       Physical Exam Constitutional:      Comments: Awake but drowsy, mentation waxes and wanes   HENT:     Head: Normocephalic.     Mouth/Throat:     Mouth: Mucous membranes are dry.  Cardiovascular:     Rate and Rhythm: Normal rate and regular rhythm.  Pulmonary:     Effort: Pulmonary effort is normal.  Abdominal:      General: Abdomen is flat.     Palpations: Abdomen is soft.  Skin:    General: Skin is warm.  Neurological:     Comments: Unable to ascertain   Psychiatric:     Comments: Unable to ascertain      Data Reviewed:   Disposition: Status is: Inpatient  Planned Discharge Destination: Skilled nursing facility    Time spent: 35 minutes  Author: Lucienne Minks , MD 03/03/2022 1:56 PM  For on call review [email or web address].

## 2022-03-03 NOTE — Consult Note (Signed)
[facility]  CONSULT NOTE    Date: 03/03/2022                  Patient Name:  Cory Campbell  MRN: [id number]  DOB: [birth year]  Age / Sex: 42 y.o., male         PCP: Donnie Coffin, MD                 Service Requesting Consult: [place]                 Reason for Consult: Acute kidney failure            History of Present Illness: Cory Campbell is a 42 y.o.  male with past medical conditions including depression, HIV, hyperlipidemia, hypertension, BPH, who was admitted to [facility] on 03/02/2022 for Hyperkalemia [E87.5] Hypoglycemia [E16.2] Acute renal failure, unspecified acute renal failure type ([place]) [N17.9]  Patient seen resting in bed, no family at bedside. Somnolent but arousable. Patient states has felt bad for the past few days. Reports poor oral intake, denies nausea and vomiting.Denies previous kidney concerns. Decreased urine output. Reports use of NSAIDs  for 1-2 months due to lower extremity discomfort. Room air. Trace lower extremity edema.   Labs on ED arrival include sodium 125, potassium 6.2, serum bicarb 19, BUN 62, creatinine 5.05 with GFR 14.  UA appears cloudy with proteinuria and bacteria.  Renal ultrasound negative for obstruction.Urine culture pending.  Creatinine now up to 5.8 this morning.  Normal function noted 2 months ago.   Medications: Outpatient medications: Medications Prior to Admission  Medication Sig Dispense Refill Last Dose   acyclovir (ZOVIRAX) 400 MG tablet Take 1 tablet (400 mg total) by mouth 3 (three) times daily. 90 tablet 0 unk   albuterol (PROVENTIL HFA;VENTOLIN HFA) 108 (90 BASE) MCG/ACT inhaler Inhale 2 puffs into the lungs every 4 (four) hours as needed for wheezing or shortness of breath.    unk   albuterol (PROVENTIL) (2.5 MG/3ML) 0.083% nebulizer solution Take 2.5 mg by nebulization every 4 (four) hours as needed for wheezing or shortness of breath.   unk   amLODipine (NORVASC) 10 MG tablet Take 10 mg by  mouth daily.   unk   aspirin EC 81 MG tablet Take 81 mg by mouth daily. Swallow whole.   unk   BIKTARVY 50-200-25 MG TABS tablet Take 1 tablet by mouth daily.  1 unk   buprenorphine (BUTRANS) 7.5 MCG/HR Place 7.5 mg onto the skin once a week. Saturday   unk   buPROPion (WELLBUTRIN XL) 150 MG 24 hr tablet Take 150 mg by mouth daily.   unk   cetirizine (ZYRTEC) 10 MG tablet Take 10 mg by mouth daily.   unk   chlorthalidone (HYGROTON) 25 MG tablet Take 25 mg by mouth daily.   unk   citalopram (CELEXA) 20 MG tablet Take 20 mg by mouth daily.   unk   cloNIDine (CATAPRES - DOSED IN MG/24 HR) 0.3 mg/24hr patch 0.3 mg once a week.   unk   diclofenac sodium (VOLTAREN) 1 % GEL Apply 2-4 g topically 4 (four) times daily as needed. For pain.   unk   dicyclomine (BENTYL) 10 MG capsule Take 20 mg by mouth 2 (two) times daily before a meal.   unk   empagliflozin (JARDIANCE) 25 MG TABS tablet Take 25 mg by mouth daily.   unk   famotidine (PEPCID) 20 MG tablet Take 20 mg by mouth  daily.    unk   ferrous sulfate 325 (65 FE) MG tablet Take 325 mg by mouth daily with breakfast.   unk   fluticasone (FLONASE) 50 MCG/ACT nasal spray Place 2 sprays into both nostrils daily.   unk   fluticasone (FLOVENT HFA) 110 MCG/ACT inhaler Inhale 2 puffs into the lungs 2 (two) times daily.   unk   gabapentin (NEURONTIN) 300 MG capsule Take 1 capsule (300 mg total) by mouth 2 (two) times daily. (Patient taking differently: Take 300 mg by mouth 3 (three) times daily.) 60 capsule 0 unk   glucose 4 GM chewable tablet Chew 1 tablet by mouth as needed for low blood sugar.   unk   insulin degludec (TRESIBA) 100 UNIT/ML FlexTouch Pen Inject 100 Units into the skin 2 (two) times daily.   unk   ipratropium (ATROVENT) 0.06 % nasal spray Place 2 sprays into both nostrils 4 (four) times daily. 15 mL 12 unk   isosorbide mononitrate (IMDUR) 60 MG 24 hr tablet Take 60 mg by mouth daily.   unk   ketoconazole (NIZORAL) 2 % shampoo Apply 1  application topically 2 (two) times a week. tues and thursday   unk   Melatonin 5 MG TABS Take 10 mg by mouth at bedtime as needed (sleep).   unk   metFORMIN (GLUCOPHAGE-XR) 500 MG 24 hr tablet Take 1,000 mg by mouth 2 (two) times daily.   unk   metoprolol tartrate (LOPRESSOR) 100 MG tablet TAKE 1 AND 1/2 TABLETS(150 MG) BY MOUTH TWICE DAILY 270 tablet 0 unk   mirtazapine (REMERON) 45 MG tablet Take 45 mg by mouth at bedtime.   unk   Multiple Vitamin (MULTIVITAMIN) tablet Take 1 tablet by mouth daily.   unk   nitroGLYCERIN (NITROSTAT) 0.4 MG SL tablet PLACE 1 TABLET SUBLINGUALLY EVERY 5 MINUTES AS NEEDED FOR CHEST PAIN. CALL 911 IF YOU HAVE TAKEN 3 NITRO FOR CHEST PAIN 100 tablet 0 unk   nortriptyline (PAMELOR) 25 MG capsule Take 50 mg by mouth at bedtime.   unk   omega-3 acid ethyl esters (LOVAZA) 1 g capsule Take 1 g by mouth 3 (three) times daily.   unk   omeprazole (PRILOSEC) 20 MG capsule Take 20 mg by mouth daily.   unk   ondansetron (ZOFRAN-ODT) 4 MG disintegrating tablet Take 4 mg by mouth every 8 (eight) hours as needed for nausea or vomiting.   unk   polyethylene glycol (MIRALAX / GLYCOLAX) 17 g packet Take 17 g by mouth 3 (three) times daily.   unk   rosuvastatin (CRESTOR) 40 MG tablet TAKE 1 TABLET(40 MG) BY MOUTH DAILY 90 tablet 0 unk   solifenacin (VESICARE) 10 MG tablet Take 10 mg by mouth daily.   unk   spironolactone (ALDACTONE) 50 MG tablet Take 100 mg by mouth daily.   unk   tiZANidine (ZANAFLEX) 4 MG tablet Take 1 tablet (4 mg total) by mouth 3 (three) times daily. 90 tablet 11 unk   TRULICITY 4.5 MG/0.5ML SOPN SMARTSIG:4.5 Milligram(s) SUB-Q Once a Week   unk   Elastic Bandages & Supports (T.E.D. BELOW KNEE/MEDIUM) MISC 1 each by Does not apply route daily. Measure legs and give appropriate size. 3 each 0    feeding supplement, GLUCERNA SHAKE, (GLUCERNA SHAKE) LIQD Take 237 mLs by mouth 4 (four) times daily.      finasteride (PROSCAR) 5 MG tablet Take 5 mg by mouth daily.       lisinopril (ZESTRIL) 40 MG tablet Take  40 mg by mouth daily.      naproxen (NAPROSYN) 500 MG tablet Take 500 mg by mouth 2 (two) times daily.      Spacer/Aero-Holding Chambers (AEROCHAMBER PLUS) inhaler Use as instructed 1 each 2    sulfamethoxazole-trimethoprim (BACTRIM DS) 800-160 MG tablet Take 2 tablets by mouth in the morning and at bedtime for 7 days. 28 tablet 0     Current medications: Current Facility-Administered Medications  Medication Dose Route Frequency Provider Last Rate Last Admin   0.9 %  sodium chloride infusion   Intravenous Continuous Baron Hamper, MD       acetaminophen (TYLENOL) tablet 650 mg  650 mg Oral Q6H PRN Cox, Amy N, DO       Or   acetaminophen (TYLENOL) suppository 650 mg  650 mg Rectal Q6H PRN Cox, Amy N, DO       acyclovir (ZOVIRAX) 200 MG capsule 200 mg  200 mg Oral BID Baron Hamper, MD       albuterol (PROVENTIL) (2.5 MG/3ML) 0.083% nebulizer solution 2.5 mg  2.5 mg Nebulization Q4H PRN Cox, Amy N, DO       aspirin EC tablet 81 mg  81 mg Oral Daily Cox, Amy N, DO   81 mg at 03/03/22 1007   azithromycin (ZITHROMAX) 500 mg in sodium chloride 0.9 % 250 mL IVPB  500 mg Intravenous Q24H Cox, Amy N, DO   Stopped at 03/02/22 1901   bictegravir-emtricitabine-tenofovir AF (BIKTARVY) 50-200-25 MG per tablet 1 tablet  1 tablet Oral Daily Cox, Amy N, DO   1 tablet at 03/03/22 1007   ceFEPIme (MAXIPIME) 2 g in sodium chloride 0.9 % 100 mL IVPB  2 g Intravenous Q24H Cox, Amy N, DO   Stopped at 03/02/22 1737   heparin injection 5,000 Units  5,000 Units Subcutaneous Q8H Cox, Amy N, DO   5,000 Units at 03/03/22 1435   hydrALAZINE (APRESOLINE) injection 5 mg  5 mg Intravenous Q8H PRN Cox, Amy N, DO       insulin aspart (novoLOG) injection 0-15 Units  0-15 Units Subcutaneous TID WC Cox, Amy N, DO   2 Units at 03/03/22 1138   insulin aspart (novoLOG) injection 0-5 Units  0-5 Units Subcutaneous QHS Cox, Amy N, DO       LORazepam (ATIVAN) injection 2 mg  2 mg Intravenous PRN  Cox, Amy N, DO       melatonin tablet 10 mg  10 mg Oral QHS PRN Cox, Amy N, DO       metroNIDAZOLE (FLAGYL) IVPB 500 mg  500 mg Intravenous Q12H Cox, Amy N, DO 100 mL/hr at 03/03/22 0351 500 mg at 03/03/22 0351   nicotine (NICODERM CQ - dosed in mg/24 hours) patch 21 mg  21 mg Transdermal Daily PRN Cox, Amy N, DO       nitroGLYCERIN (NITROSTAT) SL tablet 0.4 mg  0.4 mg Sublingual Q5 min PRN Cox, Amy N, DO       ondansetron (ZOFRAN) tablet 4 mg  4 mg Oral Q6H PRN Cox, Amy N, DO   4 mg at 03/03/22 1128   Or   ondansetron (ZOFRAN) injection 4 mg  4 mg Intravenous Q6H PRN Cox, Amy N, DO       rosuvastatin (CRESTOR) tablet 10 mg  10 mg Oral QHS Cox, Amy N, DO   10 mg at 03/02/22 2234   senna-docusate (Senokot-S) tablet 1 tablet  1 tablet Oral QHS PRN Cox, Amy N, DO  sodium zirconium cyclosilicate (LOKELMA) packet 10 g  10 g Oral TID Lucienne Minks, MD   10 g at 03/03/22 6010      Allergies: Allergies  Allergen Reactions   Penicillins Hives, Itching and Rash    Has patient had a PCN reaction causing immediate rash, facial/tongue/throat swelling, SOB or lightheadedness with hypotension: Yes Has patient had a PCN reaction causing severe rash involving mucus membranes or skin necrosis: No Has patient had a PCN reaction that required hospitalization No Has patient had a PCN reaction occurring within the last 10 years: No If all of the above answers are "NO", then may proceed with Cephalosporin use.  Has tolerated amoxicillin without problems   Erythromycin Base Itching and Rash      Past Medical History: Past Medical History:  Diagnosis Date   (HFpEF) heart failure with preserved ejection fraction (HCC)    Acid reflux    Adopted    Anxiety    Arthritis    Arthrogryposis    Asthma    BPH (benign prostatic hyperplasia)    Bronchitis    Chronic pain syndrome    Depressive disorder, not elsewhere classified    Diabetes mellitus without complication ([place])    Diverticulitis of colon  (without mention of hemorrhage)(562.11)    ED (erectile dysfunction)    Herpes genitalis    HIV infection ([place])    HTN (hypertension)    Hyperlipidemia    Hypertensive cardiomyopathy ([place])    Hypogonadism in male    OAB (overactive bladder)    Other psoriasis    Seizure disorder (HCC)    Sleep apnea    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 or other cause, without mention of thyrotoxic crisis or storm    hyperthyroidism     Past Surgical History: Past Surgical History:  Procedure Laterality Date   APPENDECTOMY     COLOSTOMY     CORNEAL TRANSPLANT     feet surgery     both   HAND SURGERY     left and right   leg surgery     left and right   ORTHOPEDIC SURGERY     hands, feet, knees, legs   tubes in ears     both     Family History: Family History  Adopted: Yes  Family history unknown: Yes     Social History: Social History   Socioeconomic History   Marital status: Married    Spouse name: Not on file   Number of children: Not on file   Years of education: Not on file   Highest education level: Not on file  Occupational History   Occupation: Scientist, water quality  Tobacco Use   Smoking status: Every Day    Types: Pipe, Cigars, E-cigarettes    Last attempt to quit: 01/2018    Years since quitting: 4.1   Smokeless tobacco: Never   Tobacco comments:    Declines patch  Vaping Use   Vaping Use: Every day   Substances: Nicotine, Flavoring  Substance and Sexual Activity   Alcohol use: Not Currently    Alcohol/week: 3.0 standard drinks of alcohol    Types: 3 Cans of beer per week   Drug use: No   Sexual activity: Not on file  Other Topics Concern   Not on file  Social History Narrative   Uses crutches to ambulate   Social Determinants of Health   Financial Resource Strain: Not on file  Food Insecurity: No Food Insecurity (11/24/2021)   Hunger Vital  Sign    Worried About Programme researcher, broadcasting/film/video in the Last Year: Never true    Ran Out of Food in the Last Year:  Never true  Transportation Needs: No Transportation Needs (11/24/2021)   PRAPARE - Administrator, Civil Service (Medical): No    Lack of Transportation (Non-Medical): No  Physical Activity: Not on file  Stress: Not on file  Social Connections: Not on file  Intimate Partner Violence: Not At Risk (11/24/2021)   Humiliation, Afraid, Rape, and Kick questionnaire    Fear of Current or Ex-Partner: No    Emotionally Abused: No    Physically Abused: No    Sexually Abused: No     Review of Systems: Review of Systems  Constitutional:  Negative for chills, fever and malaise/fatigue.  HENT:  Negative for congestion, sore throat and tinnitus.   Eyes:  Negative for blurred vision and redness.  Respiratory:  Negative for cough, shortness of breath and wheezing.   Cardiovascular:  Negative for chest pain, palpitations, claudication and leg swelling.  Gastrointestinal:  Negative for abdominal pain, blood in stool, diarrhea, nausea and vomiting.  Genitourinary:  Negative for flank pain, frequency and hematuria.  Musculoskeletal:  Negative for back pain, falls and myalgias.  Skin:  Negative for rash.  Neurological:  Positive for weakness. Negative for dizziness and headaches.  Endo/Heme/Allergies:  Does not bruise/bleed easily.  Psychiatric/Behavioral:  Negative for depression. The patient is not nervous/anxious and does not have insomnia.     Vital Signs: Blood pressure 109/73, pulse 90, temperature 97.9 F (36.6 C), temperature source Axillary, resp. rate 14, height 5\' 6"  (1.676 m), weight 78.9 kg, SpO2 90 %.  Weight trends: Filed Weights   03/02/22 1209  Weight: 78.9 kg    Physical Exam: General: Ill-appearing  Head: Normocephalic, atraumatic. Moist oral mucosal membranes  Eyes: Anicteric  Lungs:  Basilar rhonchi, normal effort, room air  Heart: Regular rate and rhythm  Abdomen:  Soft, nontender, nondistended  Extremities: Trace peripheral edema.  Neurologic: Drowsy but  arousable for short periods, moving all four extremities  Skin: No lesions  Access: None     Lab results: Basic Metabolic Panel: Recent Labs  Lab 03/02/22 1400 03/02/22 1733 03/03/22 0406  NA 126* 124* 125*  K 6.1* 5.0 5.5*  CL 90* 93* 92*  CO2 19* 17* 19*  GLUCOSE 74 180* 112*  BUN 61* 61* 65*  CREATININE 4.90* 5.00* 5.80*  CALCIUM 9.2 8.9 8.6*    Liver Function Tests: Recent Labs  Lab 02/24/22 2035 03/02/22 1208  AST 25 24  ALT 15 16  ALKPHOS 71 72  BILITOT 0.7 0.7  PROT 7.6 7.3  ALBUMIN 4.0 3.9   Recent Labs  Lab 02/24/22 2035  LIPASE 42   No results for input(s): "AMMONIA" in the last 168 hours.  CBC: Recent Labs  Lab 02/24/22 2035 02/27/22 1536 03/02/22 1208 03/03/22 0406  WBC 5.9 5.3 7.4 6.5  NEUTROABS  --   --  5.2  --   HGB 15.1 16.0 14.9 12.9*  HCT 46.8 49.6 44.2 38.0*  MCV 83.6 83.9 81.1 80.0  PLT 236 264 261 207    Cardiac Enzymes: No results for input(s): "CKTOTAL", "CKMB", "CKMBINDEX", "TROPONINI" in the last 168 hours.  BNP: Invalid input(s): "POCBNP"  CBG: Recent Labs  Lab 03/02/22 2304 03/03/22 0010 03/03/22 0506 03/03/22 0822 03/03/22 1136  GLUCAP 145* 210* 99 117* 128*    Microbiology: Results for orders placed or performed during the hospital  encounter of 03/02/22  Culture, blood (Routine X 2) w Reflex to ID Panel     Status: None (Preliminary result)   Collection Time: 03/02/22  2:15 PM   Specimen: BLOOD  Result Value Ref Range Status   Specimen Description BLOOD LEFT ANTECUBITAL  Final   Special Requests   Final    BOTTLES DRAWN AEROBIC AND ANAEROBIC Blood Culture adequate volume   Culture   Final    NO GROWTH < 12 HOURS Performed at [facility], [address] [place], [place] [address]    Report Status PENDING  Incomplete  Culture, blood (Routine X 2) w Reflex to ID Panel     Status: None (Preliminary result)   Collection Time: 03/02/22  2:20 PM   Specimen: BLOOD  Result Value Ref Range  Status   Specimen Description BLOOD RIGHT ANTECUBITAL  Final   Special Requests   Final    BOTTLES DRAWN AEROBIC AND ANAEROBIC Blood Culture adequate volume   Culture   Final    NO GROWTH < 12 HOURS Performed at [facility], [address] [place], [place] [address]    Report Status PENDING  Incomplete  MRSA Next Gen by PCR, Nasal     Status: None   Collection Time: 03/03/22  6:05 AM   Specimen: Nasal Mucosa; Nasal Swab  Result Value Ref Range Status   MRSA by PCR Next Gen NOT DETECTED NOT DETECTED Final    Comment: (NOTE) The GeneXpert MRSA Assay (FDA approved for NASAL specimens only), is one component of a comprehensive MRSA colonization surveillance program. It is not intended to diagnose MRSA infection nor to guide or monitor treatment for MRSA infections. Test performance is not FDA approved in patients less than 64 years old. Performed at [facility], [address] [place],  [address]     Coagulation Studies: No results for input(s): "LABPROT", "INR" in the last 72 hours.  Urinalysis: Recent Labs    03/02/22 1208  COLORURINE YELLOW*  LABSPEC 1.014  PHURINE 5.0  GLUCOSEU >=500*  HGBUR NEGATIVE  BILIRUBINUR NEGATIVE  KETONESUR NEGATIVE  PROTEINUR 100*  NITRITE NEGATIVE  LEUKOCYTESUR TRACE*      Imaging: DG Chest Port 1 View  Result Date: 03/02/2022 CLINICAL DATA:  [id number] Altered mental status [id number] EXAM: PORTABLE CHEST - 1 VIEW COMPARISON:  12/24/2021 FINDINGS: Cardiac silhouette is unremarkable. No pneumothorax or pleural effusion. The lungs are clear. The visualized skeletal structures are unremarkable. IMPRESSION: No acute cardiopulmonary process. Electronically Signed   By: Joshua  Pleasure M.D.   On: 03/02/2022 14:57   CT HEAD WO CONTRAST (<MEASUREMENJ64RenJanHayden RaNata J56RenJanHayden RaNata J73RenJanHayden RaNata J40RenJanHayden RaNata J70RenJanHayden RaNata JRenJanHayden RaNata J91RenJanHayden RaNata J66RenJanHayden RaNata J20RenJanHayden RaNata J31RenJanHayden RaNata J(66RenJanHayden RaNata J(45RenJanHayden RaNata J32RenJanHayden RaNata J43RenJanHayden RaNata J(701RenJanHayden RaNata J55RenJanHayden RaNata J22RenJanHayden RaNata J(463RenJanHayden RaNata J(52RenJanHayden RaNata J41RenJanHayden RaNata J(530RenJanHayden RaNata J64RenJanHayden RaNata JRenJanHayden RaNata J64RenJanHayden RaNatasha Meadleongate: 03/02/2022 CLINICAL DATA:  Altered mental status EXAM: CT HEAD WITHOUT CONTRAST TECHNIQUE: Contiguous axial images were obtained from the base of the skull through the  vertex without intravenous contrast. RADIATION DOSE REDUCTION: This exam was performed according to the departmental dose-optimization program which includes automated exposure control, adjustment of the mA and/or kV according to patient size and/or use of iterative reconstruction technique. COMPARISON:  02/27/2022 FINDINGS: Brain: No evidence of acute infarction, hemorrhage, hydrocephalus, extra-axial collection or mass lesion/mass effect. Vascular: No hyperdense vessel or unexpected calcification. Skull: Normal. Negative for fracture or focal lesion. Sinuses/Orbits: No acute finding. IMPRESSION: No acute intracranial process. Electronically Signed   By: Joshua  Pleasure M.D.   On: 03/02/2022 14:50   US RENAL  Result Date: 03/02/2022 CLINICAL DATA:  Acute renal failure. EXAM: RENAL / URINARY TRACT ULTRASOUND COMPLETE COMPARISON:  CT abdomen/pelvis 02/25/2022. FINDINGS:  Right Kidney: Renal measurements: 12.3 x 7.4 x 6.3 = volume: 297.9. mL. Increased echogenicity. No mass or hydronephrosis. Left Kidney: Renal measurements: 10.4 x 7.3 x 6.1 = volume: 241.2 mL. Increased echogenicity. No mass or hydronephrosis. Bladder: Appears normal for degree of bladder distention. Other: None. IMPRESSION: Increased echogenicity in the bilateral kidneys consistent with medical renal disease. Electronically Signed   By: Orvan Falconer M.D.   On: 03/02/2022 13:47     Assessment & Plan: Cory Campbell is a 42 y.o.  male with past medical conditions including depression, HIV, hyperlipidemia, hypertension, BPH, who was admitted to [facility] on 03/02/2022 for Hyperkalemia [E87.5] Hypoglycemia [E16.2] Acute renal failure, unspecified acute renal failure type (HCC) [N17.9]  Acute kidney injury with proteinuria/hyperkalemia/hyponatremia.  Creatinine 5.05 on ED admission.  AKI appears multifactorial due to recent UTI infection, hypovolemia, and NSAID use.  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   Renal ultrasound negative for obstruction.Will order full serologic workup including complements, SPEP/UPEP, anca, ANA and kappa lights. Continue IVF for now. Avoid nephrotoxic agents and therapies, including hypotension. No acute indication for dialysis at this time. May consider renal biopsy on Monday.   2. Hypertension, essential. Home regimen includes amlodipine, clonidine, isosorbide, lisinopril and metoprolol. Receiving hydralazine only.     LOS: 1 Kailen Name 1/26/20242:37 PM

## 2022-03-03 NOTE — TOC Progression Note (Addendum)
Transition of Care [facility]) - Progression Note    Patient Details  Name: Cory Campbell MRN: [id number] Date of Birth: Jul 07, 1980  Transition of Care [facility]) CM/SW Contact  Laurena Slimmer, RN Phone Number: 03/03/2022, 1:45 PM  Clinical Narrative:    Case reviewed for needs and disposition.   Admitted for: home for hyperkalemia Admitted from: home with his wife and 44 year old son PCP: Dr. Tomasa Hose [facility]acy: [facility] [place]. Denies difficulty obtaining medications Current home health/prior home health/DME: Braces, power wheel chair Transportation: [employer]. Will needs EMS at discharge.        Expected Discharge Plan and Services                                               Social Determinants of Health (SDOH) Interventions SDOH Screenings   Food Insecurity: No Food Insecurity (11/24/2021)  Housing: Low Risk  (11/24/2021)  Transportation Needs: No Transportation Needs (11/24/2021)  Utilities: Not At Risk (11/24/2021)  Tobacco Use: High Risk (03/02/2022)    Readmission Risk Interventions    09/11/2021   10:27 AM  Readmission Risk Prevention Plan  Transportation Screening Complete  PCP or Specialist Appt within 3-5 Days Complete  HRI or [place] Complete  Social Work Consult for [place] Planning/Counseling Complete  Palliative Care Screening Not Applicable  Medication Review [employer]) Complete

## 2022-03-03 NOTE — Progress Notes (Signed)
CROSS COVER NOTE  NAME: LASHON HILLIER MRN: [id number] DOB : February 04, 1981    HPI/Events of Note   Nurse reports inability to void and over 500 volume by bladder scan  Assessment and  Interventions   Assessment: Requested nurse sit patient up on edge of bed but was onlky able to void 100cc Plan: In and out cath       Kathlene Cote NP [facility]

## 2022-03-04 ENCOUNTER — Inpatient Hospital Stay: Payer: Medicaid Other

## 2022-03-04 DIAGNOSIS — E875 Hyperkalemia: Secondary | ICD-10-CM | POA: Diagnosis not present

## 2022-03-04 LAB — MAGNESIUM: Magnesium: 2.8 mg/dL — ABNORMAL HIGH (ref 1.7–2.4)

## 2022-03-04 LAB — COMPREHENSIVE METABOLIC PANEL
ALT: 13 U/L (ref 0–44)
AST: 34 U/L (ref 15–41)
Albumin: 2.9 g/dL — ABNORMAL LOW (ref 3.5–5.0)
Alkaline Phosphatase: 61 U/L (ref 38–126)
Anion gap: 17 — ABNORMAL HIGH (ref 5–15)
BUN: 78 mg/dL — ABNORMAL HIGH (ref 6–20)
CO2: 17 mmol/L — ABNORMAL LOW (ref 22–32)
Calcium: 8.1 mg/dL — ABNORMAL LOW (ref 8.9–10.3)
Chloride: 95 mmol/L — ABNORMAL LOW (ref 98–111)
Creatinine, Ser: 7.28 mg/dL — ABNORMAL HIGH (ref 0.61–1.24)
GFR, Estimated: 9 mL/min — ABNORMAL LOW (ref >60)
Glucose, Bld: 87 mg/dL (ref 70–99)
Potassium: 5 mmol/L (ref 3.5–5.1)
Sodium: 129 mmol/L — ABNORMAL LOW (ref 135–145)
Total Bilirubin: 0.8 mg/dL (ref 0.3–1.2)
Total Protein: 5.8 g/dL — ABNORMAL LOW (ref 6.5–8.1)

## 2022-03-04 LAB — CBC
HCT: 35.9 % — ABNORMAL LOW (ref 39.0–52.0)
Hemoglobin: 12.2 g/dL — ABNORMAL LOW (ref 13.0–17.0)
MCH: 27.4 pg (ref 26.0–34.0)
MCHC: 34 g/dL (ref 30.0–36.0)
MCV: 80.5 fL (ref 80.0–100.0)
Platelets: 185 10*3/uL (ref 150–400)
RBC: 4.46 MIL/uL (ref 4.22–5.81)
RDW: 14.6 % (ref 11.5–15.5)
WBC: 5.1 10*3/uL (ref 4.0–10.5)
nRBC: 0 % (ref 0.0–0.2)

## 2022-03-04 LAB — PROTEIN / CREATININE RATIO, URINE
Creatinine, Urine: 20 mg/dL
Protein Creatinine Ratio: 3.95 mg/mg{Cre} — ABNORMAL HIGH (ref 0.00–0.15)
Total Protein, Urine: 79 mg/dL

## 2022-03-04 LAB — HEPATITIS B CORE ANTIBODY, TOTAL: Hep B Core Total Ab: NONREACTIVE

## 2022-03-04 LAB — LACTIC ACID, PLASMA: Lactic Acid, Venous: 2.9 mmol/L (ref 0.5–1.9)

## 2022-03-04 LAB — PROCALCITONIN: Procalcitonin: 1.1 ng/mL

## 2022-03-04 LAB — GLUCOSE, CAPILLARY
Glucose-Capillary: 73 mg/dL (ref 70–99)
Glucose-Capillary: 87 mg/dL (ref 70–99)
Glucose-Capillary: 59 mg/dL — ABNORMAL LOW (ref 70–99)
Glucose-Capillary: 90 mg/dL (ref 70–99)

## 2022-03-04 LAB — C-REACTIVE PROTEIN: CRP: 14 mg/dL — ABNORMAL HIGH (ref <1.0)

## 2022-03-04 LAB — HEPATITIS B SURFACE ANTIGEN: Hepatitis B Surface Ag: NONREACTIVE

## 2022-03-04 LAB — PHOSPHORUS: Phosphorus: 7.6 mg/dL — ABNORMAL HIGH (ref 2.5–4.6)

## 2022-03-04 MED ORDER — ANTICOAGULANT SODIUM CITRATE 4% (200MG/5ML) IV SOLN: 5.0000 mL | Status: DC | PRN

## 2022-03-04 MED ORDER — LIDOCAINE-PRILOCAINE 2.5-2.5 % EX CREA: 1.0000 | TOPICAL_CREAM | CUTANEOUS | Status: DC | PRN

## 2022-03-04 MED ORDER — PENTAFLUOROPROP-TETRAFLUOROETH EX AERO: 1.0000 | INHALATION_SPRAY | CUTANEOUS | Status: DC | PRN

## 2022-03-04 MED ORDER — SODIUM BICARBONATE 650 MG PO TABS
1300.0000 mg | ORAL_TABLET | Freq: Two times a day (BID) | ORAL | Status: DC
  Administered 2022-03-04 – 2022-03-16 (×25): 1300 mg via ORAL
  Filled 2022-03-04 (×25): qty 2

## 2022-03-04 MED ORDER — ALTEPLASE 2 MG IJ SOLR: 2.0000 mg | Freq: Once | INTRAMUSCULAR | Status: DC | PRN

## 2022-03-04 MED ORDER — GLUCOSE 40 % PO GEL: 1.0000 | ORAL | Status: AC

## 2022-03-04 MED ORDER — CHLORHEXIDINE GLUCONATE CLOTH 2 % EX PADS
6.0000 | MEDICATED_PAD | Freq: Every day | CUTANEOUS | Status: DC
  Administered 2022-03-04 – 2022-03-16 (×13): 6 via TOPICAL

## 2022-03-04 MED ORDER — LIDOCAINE HCL (PF) 1 % IJ SOLN: 5.0000 mL | INTRAMUSCULAR | Status: DC | PRN

## 2022-03-04 MED ORDER — HEPARIN SODIUM (PORCINE) 1000 UNIT/ML DIALYSIS: 1000.0000 [IU] | INTRAMUSCULAR | Status: DC | PRN

## 2022-03-04 MED ORDER — CYCLOBENZAPRINE HCL 10 MG PO TABS
5.0000 mg | ORAL_TABLET | Freq: Three times a day (TID) | ORAL | Status: DC | PRN
  Administered 2022-03-04 – 2022-03-15 (×12): 5 mg via ORAL
  Filled 2022-03-04 (×13): qty 1

## 2022-03-04 NOTE — Progress Notes (Signed)
Hypoglycemic Event  CBG: 59  Treatment: 8 oz juice/soda  Symptoms: None  Follow-up CBG: Time:2112 CBG Result:90  Possible Reasons for Event: Inadequate meal intake  Comments/MD notified: BSydnee Levans, [place]

## 2022-03-04 NOTE — Op Note (Signed)
Preoperative diagnosis: Acute kidney injury  Postoperative diagnosis: Same  Procedure: Placement of right internal jugular vein temporary hemodialysis catheter under ultrasound guidance, (20 cm trialysis)  Surgeon: Lowella Grip  Anesthesia: Local, 5 cc 1% plain lidocaine  Complications: None  EBL: Less than 5 mL  Indications: Patient is a 42 year old gentleman with multiple comorbidities who presents with acute on chronic kidney injury and requires temporary hemodialysis.  Temporary catheter has been requested by nephrology to begin dialysis immediately.  Findings: Right IJ is widely patent and compressible on B-mode ultrasound imaging  Procedure: I had a long discussion preoperatively with the patient as well as his wife by telephone regarding risk benefits and alternatives to temporary hemodialysis catheter placement.  The right internal jugular vein and the femoral vein approach were both discussed.  Potential for bleeding infection and injury to surrounding structures including nerves and lung were discussed.  They both understood the discussion, asked appropriate questions and provided informed consent.  Patient was brought to the intensive care unit as he resided in a semiprivate room.  He was placed supine on his bed with the head turned towards the left.  Nasal cannula oxygen was administered.  Maximum barriers were used throughout the procedure including large drapes, mask, head covering sterile gown and gloves for the operator.  The right neck and chest were then generously prepped with ChloraPrep and draped into a sterile field using the large fenestrated drape provided in the kit.  The sterilely covered ultrasound was then used to interrogate the right internal jugular vein which was noted to be widely patent and compressible.  1% lidocaine was used to infiltrate under ultrasound guidance and a small dermatotomy created.  Via this dermatotomy singlewall venous puncture was obtained under  ultrasound guidance and a guidewire was passed into the right atrium.  Over the wire, the venotomy was serially dilated and a 20 cm trialysis catheter was passed over the wire positioned at approximately 17 cm and the wire was removed.  Both large ports were aspirated vigorously and noted to function without difficulty.  These were flushed with saline.  The wire port was similarly aspirated and flushed with saline.  Sterile caps were placed.  The catheter was then secured to the skin using 2 sutures of 3-0 nylon.  Biopatch was placed and a sterile dressing was applied.  Patient tolerated procedure without difficulty.  He was then transferred back to his room on his bed.  Chest x-ray is pending at the time of this dictation.  Catheter may be used immediately for hemodialysis once x-ray confirmation is obtained.  Catheter should be maintained and flushed per orders.

## 2022-03-04 NOTE — Progress Notes (Signed)
Pre hd rn assessment 

## 2022-03-04 NOTE — Progress Notes (Signed)
PT did 2 hrs of HD for his first tx, tolerated well with no issue  UF = 0    03/04/22 1852  Vitals  Temp 97.8 F (36.6 C)  Temp Source Oral  BP (!) 160/111  MAP (mmHg) 127  BP Location Right Arm  BP Method Automatic  Patient Position (if appropriate) Lying  Pulse Rate 98  Pulse Rate Source Monitor  ECG Heart Rate 98  Resp 16  Oxygen Therapy  SpO2 100 %  O2 Device Room Air  Patient Activity (if Appropriate) In bed  Pulse Oximetry Type Continuous  During Treatment Monitoring  HD Safety Checks Performed Yes  Intra-Hemodialysis Comments Tolerated well;Tx completed  Post Treatment  Dialyzer Clearance Lightly streaked  Duration of HD Treatment -hour(s) 2 hour(s)  Hemodialysis Intake (mL) 0 mL  Liters Processed 24  Fluid Removed (mL) 0 mL  Tolerated HD Treatment Yes  Post-Hemodialysis Comments HD complete, pt alert, no c/o, report to primary rn  Hemodialysis Catheter Right Internal jugular Double lumen Temporary (Non-Tunneled)  Placement Date/Time: 03/04/22 1400   Time Out: Correct patient;Correct site;Correct procedure  Maximum sterile barrier precautions: Hand hygiene;Cap;Mask;Sterile gown;Sterile gloves;Large sterile sheet  Site Prep: Chlorhexidine (preferred)  Local Anes...  Site Condition No complications  Blue Lumen Status Heparin locked;Flushed  Red Lumen Status Heparin locked;Flushed  Purple Lumen Status N/A  Catheter fill solution Heparin 1000 units/ml  Catheter fill volume (Arterial) 1.4 cc  Catheter fill volume (Venous) 1.4  Dressing Type Transparent  Dressing Status Antimicrobial disc in place;Clean, Dry, Intact  Drainage Description None  Dressing Change Due 03/05/22  Post treatment catheter status Capped and Clamped

## 2022-03-04 NOTE — Progress Notes (Addendum)
[facility] [place] [facility]  ROUNDING NOTE   Subjective:   Patient seen sitting up in bed More somnolent today, arousable for very short periods. Untouched breakfast tray at bedside Increased oxygen, 2 L nasal cannula Mild lower extremity edema present  UOP 755ml in proceeding 24 hours  Objective:  Vital signs in last 24 hours:  Temp:  [97.7 F (36.5 C)-98.6 F (37 C)] 97.7 F (36.5 C) (01/27 0719) Pulse Rate:  [74-100] 74 (01/27 0719) Resp:  [14-20] 14 (01/27 0719) BP: (109-138)/(73-97) 123/86 (01/27 0719) SpO2:  [71 %-100 %] 71 % (01/27 0719) FiO2 (%):  [21 %] 21 % (01/26 2240)  Weight change:  Filed Weights   03/02/22 1209  Weight: 78.9 kg    Intake/Output: I/O last 3 completed shifts: In: 1740.6 [I.V.:1190.6; IV Piggyback:550] Out: 1700 [Urine:1700]   Intake/Output this shift:  Total I/O In: 1002.9 [I.V.:296.6; IV Piggyback:706.4] Out: 325 [Urine:325]  Physical Exam: General: NAD  Head: Normocephalic, atraumatic. Moist oral mucosal membranes  Eyes: Anicteric,  Lungs:  Clear to auscultation, normal effort  Heart: Regular rate and rhythm  Abdomen:  Soft, nontender  Extremities:  1+ peripheral edema.  Neurologic: Somnolent, unable to answer simple questions.   Skin: No lesions  Access: None    Basic Metabolic Panel: Recent Labs  Lab 03/02/22 1208 03/02/22 1400 03/02/22 1733 03/03/22 0406 03/04/22 0506  NA 125* 126* 124* 125* 129*  K 6.2* 6.1* 5.0 5.5* 5.0  CL 89* 90* 93* 92* 95*  CO2 19* 19* 17* 19* 17*  GLUCOSE 88 74 180* 112* 87  BUN 62* 61* 61* 65* 78*  CREATININE 5.05* 4.90* 5.00* 5.80* 7.28*  CALCIUM 9.4 9.2 8.9 8.6* 8.1*  MG  --   --   --   --  2.8*  PHOS  --   --   --   --  7.6*    Liver Function Tests: Recent Labs  Lab 03/02/22 1208 03/04/22 0506  AST 24 34  ALT 16 13  ALKPHOS 72 61  BILITOT 0.7 0.8  PROT 7.3 5.8*  ALBUMIN 3.9 2.9*   No results for input(s): "LIPASE", "AMYLASE" in the last 168 hours. No results for  input(s): "AMMONIA" in the last 168 hours.  CBC: Recent Labs  Lab 02/27/22 1536 03/02/22 1208 03/03/22 0406 03/04/22 0506  WBC 5.3 7.4 6.5 5.1  NEUTROABS  --  5.2  --   --   HGB 16.0 14.9 12.9* 12.2*  HCT 49.6 44.2 38.0* 35.9*  MCV 83.9 81.1 80.0 80.5  PLT 264 261 207 185    Cardiac Enzymes: No results for input(s): "CKTOTAL", "CKMB", "CKMBINDEX", "TROPONINI" in the last 168 hours.  BNP: Invalid input(s): "POCBNP"  CBG: Recent Labs  Lab 03/03/22 0822 03/03/22 1136 03/03/22 1639 03/03/22 2035 03/04/22 0716  GLUCAP 117* 128* 81 89 73    Microbiology: Results for orders placed or performed during the hospital encounter of 03/02/22  Culture, blood (Routine X 2) w Reflex to ID Panel     Status: None (Preliminary result)   Collection Time: 03/02/22  2:15 PM   Specimen: BLOOD  Result Value Ref Range Status   Specimen Description BLOOD LEFT ANTECUBITAL  Final   Special Requests   Final    BOTTLES DRAWN AEROBIC AND ANAEROBIC Blood Culture adequate volume   Culture   Final    NO GROWTH 2 DAYS Performed at [facility], [address] [place], [place] [address]    Report Status PENDING  Incomplete  Culture, blood (  Routine X 2) w Reflex to ID Panel     Status: None (Preliminary result)   Collection Time: 03/02/22  2:20 PM   Specimen: BLOOD  Result Value Ref Range Status   Specimen Description BLOOD RIGHT ANTECUBITAL  Final   Special Requests   Final    BOTTLES DRAWN AEROBIC AND ANAEROBIC Blood Culture adequate volume   Culture   Final    NO GROWTH 2 DAYS Performed at [facility], [address], [place], [place] [address]    Report Status PENDING  Incomplete  Urine Culture (for pregnant, neutropenic or urologic patients or patients with an indwelling urinary catheter)     Status: Abnormal (Preliminary result)   Collection Time: 03/02/22  3:53 PM   Specimen: Urine, Clean Catch  Result Value Ref Range Status   Specimen Description   Final     URINE, CLEAN CATCH Performed at [facility], [address], [place], [place] [address]    Special Requests   Final    NONE Performed at [facility], [address], [place], [place] [address]    Culture (A)  Final    >=100,000 COLONIES/mL ENTEROCOCCUS FAECALIS SUSCEPTIBILITIES TO FOLLOW >=100,000 COLONIES/mL LACTOBACILLUS SPECIES Standardized susceptibility testing for this organism is not available. Performed at [facility], [address], [place], [place] [address]    Report Status PENDING  Incomplete  MRSA Next Gen by PCR, Nasal     Status: None   Collection Time: 03/03/22  6:05 AM   Specimen: Nasal Mucosa; Nasal Swab  Result Value Ref Range Status   MRSA by PCR Next Gen NOT DETECTED NOT DETECTED Final    Comment: (NOTE) The GeneXpert MRSA Assay (FDA approved for NASAL specimens only), is one component of a comprehensive MRSA colonization surveillance program. It is not intended to diagnose MRSA infection nor to guide or monitor treatment for MRSA infections. Test performance is not FDA approved in patients less than 77 years old. Performed at [facility], [address] [place], [place] [address]     Coagulation Studies: No results for input(s): "LABPROT", "INR" in the last 72 hours.  Urinalysis: Recent Labs    03/02/22 1208  COLORURINE YELLOW*  LABSPEC 1.014  PHURINE 5.0  GLUCOSEU >=500*  HGBUR NEGATIVE  BILIRUBINUR NEGATIVE  KETONESUR NEGATIVE  PROTEINUR 100*  NITRITE NEGATIVE  LEUKOCYTESUR TRACE*      Imaging: DG Chest Port 1 View  Result Date: 03/02/2022 CLINICAL DATA:  [id number] Altered mental status [id number] EXAM: PORTABLE CHEST - 1 VIEW COMPARISON:  12/24/2021 FINDINGS: Cardiac silhouette is unremarkable. No pneumothorax or pleural effusion. The lungs are clear. The visualized skeletal structures are unremarkable. IMPRESSION: No acute cardiopulmonary process. Electronically Signed   By: Layla Maw M.D.   On:  03/02/2022 14:57   CT HEAD WO CONTRAST ( )  Result Date: 03/02/2022 CLINICAL DATA:  Altered mental status EXAM: CT HEAD WITHOUT CONTRAST TECHNIQUE: Contiguous axial images were obtained from the base of the skull through the vertex without intravenous contrast. RADIATION DOSE REDUCTION: This exam was performed according to the departmental dose-optimization program which includes automated exposure control, adjustment of the mA and/or kV according to patient size and/or use of iterative reconstruction technique. COMPARISON:  02/27/2022 FINDINGS: Brain: No evidence of acute infarction, hemorrhage, hydrocephalus, extra-axial collection or mass lesion/mass effect. Vascular: No hyperdense vessel or unexpected calcification. Skull: Normal. Negative for fracture or focal lesion. Sinuses/Orbits: No acute finding. IMPRESSION: No acute intracranial process. Electronically Signed  By: Sammie Bench M.D.   On: 03/02/2022 14:50   US RENAL  Result Date: 03/02/2022 CLINICAL DATA:  Acute renal failure. EXAM: RENAL / URINARY TRACT ULTRASOUND COMPLETE COMPARISON:  CT abdomen/pelvis 02/25/2022. FINDINGS: Right Kidney: Renal measurements: 12.3 x 7.4 x 6.3 = volume: 297.9. mL. Increased echogenicity. No mass or hydronephrosis. Left Kidney: Renal measurements: 10.4 x 7.3 x 6.1 = volume: 241.2 mL. Increased echogenicity. No mass or hydronephrosis. Bladder: Appears normal for degree of bladder distention. Other: None. IMPRESSION: Increased echogenicity in the bilateral kidneys consistent with medical renal disease. Electronically Signed   By: Emmit Alexanders M.D.   On: 03/02/2022 13:47     Medications:    sodium chloride 125 mL/hr at 03/04/22 0800   anticoagulant sodium citrate     azithromycin Stopped (03/03/22 1626)   ceFEPime (MAXIPIME) IV Stopped (03/03/22 1710)   metronidazole 500 mg (03/04/22 0425)    acyclovir  200 mg Oral BID   aspirin EC  81 mg Oral Daily   bictegravir-emtricitabine-tenofovir AF  1  tablet Oral Daily   Chlorhexidine Gluconate Cloth  6 each Topical Q0600   heparin  5,000 Units Subcutaneous Q8H   insulin aspart  0-15 Units Subcutaneous TID WC   insulin aspart  0-5 Units Subcutaneous QHS   rosuvastatin  10 mg Oral QHS   sodium bicarbonate  1,300 mg Oral BID   acetaminophen **OR** acetaminophen, albuterol, alteplase, anticoagulant sodium citrate, cyclobenzaprine, heparin, hydrALAZINE, lidocaine (PF), lidocaine-prilocaine, LORazepam, melatonin, nicotine, nitroGLYCERIN, ondansetron **OR** ondansetron (ZOFRAN) IV, pentafluoroprop-tetrafluoroeth, senna-docusate  Assessment/ Plan:  Cory Campbell is a 42 y.o.  male with past medical conditions including depression, HIV, hyperlipidemia, hypertension, BPH, who was admitted to [facility] on 03/02/2022 for Hyperkalemia [E87.5] Hypoglycemia [E16.2] Acute renal failure, unspecified acute renal failure type ([place]) [N17.9]   Acute kidney injury with proteinuria/hyperkalemia/hyponatremia.  Creatinine 5.05 on ED admission.  AKI appears multifactorial due to recent UTI infection, hypovolemia, and NSAID use.  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  Renal ultrasound negative for obstruction. Serologic workup pending: complements, SPEP/UPEP, anca, ANA and kappa lights.  More somnolent today, concerning for uremia. This provider contacted patient's wife via telephone and discussed treatment. Wife agreeable to proceed with dialysis. Will consult vascular surgery for placement of HD temp cath and initiate dialysis today. Will order protein creatinine ratio. Next treatment scheduled for Monday. Will consider renal biopsy later next week. Continue to avoid nephrotoxic agents and therapies.   Lab Results  Component Value Date   CREATININE 7.28 (H) 03/04/2022   CREATININE 5.80 (H) 03/03/2022   CREATININE 5.00 (H) 03/02/2022    Intake/Output Summary (Last 24 hours) at 03/04/2022 1113 Last data filed at 03/04/2022  1000 Gross per 24 hour  Intake 2393.56 ml  Output 1025 ml  Net 1368.56 ml   2. Hypertension, essential. Home regimen includes amlodipine, clonidine, isosorbide, lisinopril and metoprolol. Blood pressure stable    LOS: 2 Bear Osten 1/27/202411:13 AM

## 2022-03-04 NOTE — Progress Notes (Signed)
  Progress Note   Patient: Cory Campbell VCB:[id number] DOB: 18-Feb-1980 DOA: 03/02/2022     2 DOS: the patient was seen and examined on 03/04/2022   Brief hospital course:  Assessment and Plan:  * Hyperkalemia - Resolved  - Nephrology following    Metabolic acidosis - Nephrology consulted  - IV NS 125 cc/hr  - Sodium bicarb tablets 1300 mg PO bid    Lactic acidosis - IV fluids as above - Lactic acid decreasing 4.6 --> 2.9   - Lactic acid in AM    AKI (acute kidney injury) ([place]) - IV fluids as above  - Nephro will discuss with the pt and pt family regarding starting dialysis    Hypotension - Improved  - IV flagyl 500 mg q12  - IV cefepime 2 g daily  - IV azithromycin 500 mg daily  - IV NS 125 cc/hr    HIV (human immunodeficiency virus infection) ([place]) - Biktarvy 1 tablet PO daily    Essential hypertension - Monitor    Tobacco dependence - Nicotine patch 21 mg TD daily    Hyponatremia - Improving  - IV fluids as above    Weakness - Management as above    Seizure disorder (HCC) - IV ativan 2 mg PRN    Frequent falls - Fall precaution   Sleep apnea - CPAP nightly ordered   Insulin dependent type 2 diabetes mellitus (HCC) - Novolog tid and qhs  - Crestor 10 mg PO daily    DVT prophylaxis: Heparin 5000 units sq q8hr       Subjective: Pt seen and examined at the bedside. BP is improved. Mentation is improved. However, kidney function is worse. Discussed with nephrology today and they will discuss starting dialysis with the pt and pt's family.   Physical Exam: Vitals:   03/03/22 2032 03/04/22 0018 03/04/22 0325 03/04/22 0719  BP: (!) 137/97 (!) 138/93 132/88 123/86  Pulse: 99 100 94 74  Resp: 20 18 20 14   Temp: 98.1 F (36.7 C) 98.6 F (37 C) 98.4 F (36.9 C) 97.7 F (36.5 C)  TempSrc:    Oral  SpO2: 97% 100% 100% (!) 71%  Weight:      Height:       HENT:     Head: Normocephalic.     Mouth/Throat:     Mouth: Mucous membranes are moist    Cardiovascular:     Rate and Rhythm: Normal rate and regular rhythm.  Pulmonary:     Effort: Pulmonary effort is normal.  Abdominal:     General: Abdomen is flat.     Palpations: Abdomen is soft obese   Skin:    General: Skin is warm.  Neurological:     Comments: Alert and awake  Psychiatric:     Comments: Mood neutral   Data Reviewed:   Disposition: Status is: Inpatient  Planned Discharge Destination:  Pending as pt is acutely ill and possibly starting dialysis on this admission     Time spent: 35 minutes  Author: Lucienne Minks , MD 03/04/2022 10:16 AM  For on call review [email or web address].

## 2022-03-04 NOTE — Progress Notes (Signed)
Post HD RN assessment 

## 2022-03-04 NOTE — Plan of Care (Signed)
  Problem: Coping: Goal: Ability to adjust to condition or change in health will improve Outcome: Progressing   Problem: Education: Goal: Individualized Educational Video(s) Outcome: Progressing   Problem: Education: Goal: Ability to describe self-care measures that may prevent or decrease complications (Diabetes Survival Skills Education) will improve Outcome: Progressing

## 2022-03-05 ENCOUNTER — Inpatient Hospital Stay: Payer: Medicaid Other

## 2022-03-05 DIAGNOSIS — E875 Hyperkalemia: Secondary | ICD-10-CM | POA: Diagnosis not present

## 2022-03-05 LAB — COMPREHENSIVE METABOLIC PANEL
ALT: 17 U/L (ref 0–44)
AST: 52 U/L — ABNORMAL HIGH (ref 15–41)
Albumin: 2.8 g/dL — ABNORMAL LOW (ref 3.5–5.0)
Alkaline Phosphatase: 69 U/L (ref 38–126)
Anion gap: 12 (ref 5–15)
BUN: 51 mg/dL — ABNORMAL HIGH (ref 6–20)
CO2: 21 mmol/L — ABNORMAL LOW (ref 22–32)
Calcium: 7.8 mg/dL — ABNORMAL LOW (ref 8.9–10.3)
Chloride: 101 mmol/L (ref 98–111)
Creatinine, Ser: 6.29 mg/dL — ABNORMAL HIGH (ref 0.61–1.24)
GFR, Estimated: 11 mL/min — ABNORMAL LOW (ref >60)
Glucose, Bld: 96 mg/dL (ref 70–99)
Potassium: 4.5 mmol/L (ref 3.5–5.1)
Sodium: 134 mmol/L — ABNORMAL LOW (ref 135–145)
Total Bilirubin: 0.8 mg/dL (ref 0.3–1.2)
Total Protein: 5.9 g/dL — ABNORMAL LOW (ref 6.5–8.1)

## 2022-03-05 LAB — CBC
HCT: 36.9 % — ABNORMAL LOW (ref 39.0–52.0)
Hemoglobin: 12.5 g/dL — ABNORMAL LOW (ref 13.0–17.0)
MCH: 26.8 pg (ref 26.0–34.0)
MCHC: 33.9 g/dL (ref 30.0–36.0)
MCV: 79.2 fL — ABNORMAL LOW (ref 80.0–100.0)
Platelets: 179 10*3/uL (ref 150–400)
RBC: 4.66 MIL/uL (ref 4.22–5.81)
RDW: 15 % (ref 11.5–15.5)
WBC: 4.3 10*3/uL (ref 4.0–10.5)
nRBC: 0 % (ref 0.0–0.2)

## 2022-03-05 LAB — C4 COMPLEMENT: Complement C4, Body Fluid: 32 mg/dL (ref 12–38)

## 2022-03-05 LAB — HEPATITIS B SURFACE ANTIBODY, QUANTITATIVE: Hep B S AB Quant (Post): 19.2 m[IU]/mL (ref >9.9)

## 2022-03-05 LAB — PHOSPHORUS: Phosphorus: 7.1 mg/dL — ABNORMAL HIGH (ref 2.5–4.6)

## 2022-03-05 LAB — MAGNESIUM: Magnesium: 2.4 mg/dL (ref 1.7–2.4)

## 2022-03-05 LAB — URINE CULTURE: Culture: >=100000 — AB

## 2022-03-05 LAB — ANA W/REFLEX IF POSITIVE: Anti Nuclear Antibody (ANA): NEGATIVE

## 2022-03-05 LAB — LACTIC ACID, PLASMA: Lactic Acid, Venous: 2.9 mmol/L (ref 0.5–1.9)

## 2022-03-05 LAB — C-REACTIVE PROTEIN: CRP: 20.9 mg/dL — ABNORMAL HIGH (ref <1.0)

## 2022-03-05 LAB — GLUCOSE, CAPILLARY
Glucose-Capillary: 104 mg/dL — ABNORMAL HIGH (ref 70–99)
Glucose-Capillary: 127 mg/dL — ABNORMAL HIGH (ref 70–99)
Glucose-Capillary: 72 mg/dL (ref 70–99)
Glucose-Capillary: 80 mg/dL (ref 70–99)

## 2022-03-05 LAB — C3 COMPLEMENT: C3 Complement: 126 mg/dL (ref 82–167)

## 2022-03-05 MED ORDER — ISOSORBIDE DINITRATE 10 MG PO TABS
10.0000 mg | ORAL_TABLET | Freq: Three times a day (TID) | ORAL | Status: DC
  Administered 2022-03-05 – 2022-03-06 (×4): 10 mg via ORAL
  Filled 2022-03-05 (×5): qty 1

## 2022-03-05 MED ORDER — SODIUM CHLORIDE 0.9 % IV SOLN: 1.0000 g | INTRAVENOUS | Status: DC

## 2022-03-05 MED ORDER — MORPHINE SULFATE (PF) 2 MG/ML IV SOLN
1.0000 mg | Freq: Once | INTRAVENOUS | Status: AC
  Administered 2022-03-05: 1 mg via INTRAVENOUS
  Filled 2022-03-05: qty 1

## 2022-03-05 MED ORDER — LINEZOLID 600 MG PO TABS
600.0000 mg | ORAL_TABLET | Freq: Two times a day (BID) | ORAL | Status: DC
  Administered 2022-03-05 – 2022-03-06 (×3): 600 mg via ORAL
  Filled 2022-03-05 (×3): qty 1

## 2022-03-05 NOTE — Progress Notes (Signed)
[facility]  ROUNDING NOTE   Subjective:   Patient seen sitting up in bed, reports he feels well today Continues to complain of poor appetite and food distaste Denies pain or discomfort Remains on room air with no lower extremity edema.  Objective:  Vital signs in last 24 hours:  Temp:  [97.3 F (36.3 C)-98.3 F (36.8 C)] 97.9 F (36.6 C) (01/28 0800) Pulse Rate:  [93-105] 100 (01/28 0800) Resp:  [12-20] 18 (01/28 0800) BP: (138-160)/(96-131) 138/97 (01/28 0800) SpO2:  [100 %] 100 % (01/28 0800) FiO2 (%):  [21 %] 21 % (01/27 2255) Weight:  [77.2 kg-77.3 kg] 77.2 kg (01/27 1900)  Weight change:  Filed Weights   03/02/22 1209 03/04/22 1642 03/04/22 1900  Weight: 78.9 kg 77.3 kg 77.2 kg    Intake/Output: I/O last 3 completed shifts: In: 3802.8 [I.V.:2546.4; IV Piggyback:1256.4] Out: 2130 [Urine:3155]   Intake/Output this shift:  Total I/O In: -  Out: 900 [Urine:900]  Physical Exam: General: NAD  Head: Normocephalic, atraumatic. Moist oral mucosal membranes  Eyes: Anicteric,  Lungs:  Clear to auscultation, normal effort  Heart: Regular rate and rhythm  Abdomen:  Soft, nontender  Extremities:  No.  Peripheral edema.  Neurologic: Alert and oriented unable to answer simple questions.   Skin: No lesions  Access: None    Basic Metabolic Panel: Recent Labs  Lab 03/02/22 1400 03/02/22 1733 03/03/22 0406 03/04/22 0506 03/05/22 0602  NA 126* 124* 125* 129* 134*  K 6.1* 5.0 5.5* 5.0 4.5  CL 90* 93* 92* 95* 101  CO2 19* 17* 19* 17* 21*  GLUCOSE 74 180* 112* 87 96  BUN 61* 61* 65* 78* 51*  CREATININE 4.90* 5.00* 5.80* 7.28* 6.29*  CALCIUM 9.2 8.9 8.6* 8.1* 7.8*  MG  --   --   --  2.8* 2.4  PHOS  --   --   --  7.6* 7.1*     Liver Function Tests: Recent Labs  Lab 03/02/22 1208 03/04/22 0506 03/05/22 0602  AST 24 34 52*  ALT 16 13 17   ALKPHOS 72 61 69  BILITOT 0.7 0.8 0.8  PROT 7.3 5.8* 5.9*  ALBUMIN 3.9 2.9* 2.8*    No results for  input(s): "LIPASE", "AMYLASE" in the last 168 hours. No results for input(s): "AMMONIA" in the last 168 hours.  CBC: Recent Labs  Lab 02/27/22 1536 03/02/22 1208 03/03/22 0406 03/04/22 0506 03/05/22 0602  WBC 5.3 7.4 6.5 5.1 4.3  NEUTROABS  --  5.2  --   --   --   HGB 16.0 14.9 12.9* 12.2* 12.5*  HCT 49.6 44.2 38.0* 35.9* 36.9*  MCV 83.9 81.1 80.0 80.5 79.2*  PLT 264 261 207 185 179     Cardiac Enzymes: No results for input(s): "CKTOTAL", "CKMB", "CKMBINDEX", "TROPONINI" in the last 168 hours.  BNP: Invalid input(s): "POCBNP"  CBG: Recent Labs  Lab 03/04/22 0716 03/04/22 1141 03/04/22 2047 03/04/22 2112 03/05/22 0802  GLUCAP 73 87 93* 90 104*     Microbiology: Results for orders placed or performed during the hospital encounter of 03/02/22  Culture, blood (Routine X 2) w Reflex to ID Panel     Status: None (Preliminary result)   Collection Time: 03/02/22  2:15 PM   Specimen: BLOOD  Result Value Ref Range Status   Specimen Description BLOOD LEFT ANTECUBITAL  Final   Special Requests   Final    BOTTLES DRAWN AEROBIC AND ANAEROBIC Blood Culture adequate volume   Culture  Final    NO GROWTH 3 DAYS Performed at [facility], [address] [place], [place] [address]    Report Status PENDING  Incomplete  Culture, blood (Routine X 2) w Reflex to ID Panel     Status: None (Preliminary result)   Collection Time: 03/02/22  2:20 PM   Specimen: BLOOD  Result Value Ref Range Status   Specimen Description BLOOD RIGHT ANTECUBITAL  Final   Special Requests   Final    BOTTLES DRAWN AEROBIC AND ANAEROBIC Blood Culture adequate volume   Culture   Final    NO GROWTH 3 DAYS Performed at [facility], [address], [place], [place] [address]    Report Status PENDING  Incomplete  Urine Culture (for pregnant, neutropenic or urologic patients or patients with an indwelling urinary catheter)     Status: Abnormal   Collection Time: 03/02/22  3:53 PM    Specimen: Urine, Clean Catch  Result Value Ref Range Status   Specimen Description   Final    URINE, CLEAN CATCH Performed at [facility], [address], [place], [place] [address]    Special Requests   Final    NONE Performed at [facility], [address], [place], [place] [address]    Culture (A)  Final    >=100,000 COLONIES/mL ENTEROCOCCUS FAECALIS >=100,000 COLONIES/mL LACTOBACILLUS SPECIES Standardized susceptibility testing for this organism is not available. Performed at [facility], [address], [place], [place] [address]    Report Status 03/05/2022 FINAL  Final   Organism ID, Bacteria ENTEROCOCCUS FAECALIS (A)  Final      Susceptibility   Enterococcus faecalis - MIC*    AMPICILLIN <=2 SENSITIVE Sensitive     NITROFURANTOIN <=16 SENSITIVE Sensitive     VANCOMYCIN 1 SENSITIVE Sensitive     * >=100,000 COLONIES/mL ENTEROCOCCUS FAECALIS  MRSA Next Gen by PCR, Nasal     Status: None   Collection Time: 03/03/22  6:05 AM   Specimen: Nasal Mucosa; Nasal Swab  Result Value Ref Range Status   MRSA by PCR Next Gen NOT DETECTED NOT DETECTED Final    Comment: (NOTE) The GeneXpert MRSA Assay (FDA approved for NASAL specimens only), is one component of a comprehensive MRSA colonization surveillance program. It is not intended to diagnose MRSA infection nor to guide or monitor treatment for MRSA infections. Test performance is not FDA approved in patients less than 53 years old. Performed at [facility], [address] [place], [place] [address]     Coagulation Studies: No results for input(s): "LABPROT", "INR" in the last 72 hours.  Urinalysis: Recent Labs    03/02/22 1208  COLORURINE YELLOW*  LABSPEC 1.014  PHURINE 5.0  GLUCOSEU >=500*  HGBUR NEGATIVE  BILIRUBINUR NEGATIVE  KETONESUR NEGATIVE  PROTEINUR 100*  NITRITE NEGATIVE  LEUKOCYTESUR TRACE*       Imaging: DG Chest Port 1 View  Result Date:  03/04/2022 CLINICAL DATA:  Central line placement EXAM: PORTABLE CHEST 1 VIEW COMPARISON:  Chest x-ray March 02, 2022 FINDINGS: A new double lumen right central line has been placed, terminating in the right atrium, approximally 2.5 cm inferior to the caval atrial junction. The cardiomediastinal silhouette is stable. No pneumothorax. No other acute abnormalities. IMPRESSION: A new double lumen right central line terminates in the right atrium, approximally 2.5 cm inferior to the caval atrial junction. No pneumothorax. Electronically Signed   By: Gerome Sam III M.D.   On: 03/04/2022 16:08  Medications:    sodium chloride 125 mL/hr at 03/05/22 1002   anticoagulant sodium citrate     azithromycin Stopped (03/04/22 1622)    acyclovir  200 mg Oral BID   aspirin EC  81 mg Oral Daily   bictegravir-emtricitabine-tenofovir AF  1 tablet Oral Daily   Chlorhexidine Gluconate Cloth  6 each Topical Q0600   dextrose  1 Tube Oral STAT   heparin  5,000 Units Subcutaneous Q8H   insulin aspart  0-15 Units Subcutaneous TID WC   insulin aspart  0-5 Units Subcutaneous QHS   isosorbide dinitrate  10 mg Oral TID   linezolid  600 mg Oral Q12H   rosuvastatin  10 mg Oral QHS   sodium bicarbonate  1,300 mg Oral BID   acetaminophen **OR** acetaminophen, albuterol, alteplase, anticoagulant sodium citrate, cyclobenzaprine, heparin, hydrALAZINE, lidocaine (PF), lidocaine-prilocaine, LORazepam, melatonin, nicotine, nitroGLYCERIN, ondansetron **OR** ondansetron (ZOFRAN) IV, pentafluoroprop-tetrafluoroeth, senna-docusate  Assessment/ Plan:  Mr. CHRISTOHER DRUDGE is a 42 y.o.  male with past medical conditions including depression, HIV, hyperlipidemia, hypertension, BPH, who was admitted to [facility] on 03/02/2022 for Hyperkalemia [E87.5] Hypoglycemia [E16.2] Acute renal failure, unspecified acute renal failure type ([place]) [N17.9]   Acute kidney injury with proteinuria/hyperkalemia/hyponatremia.  Creatinine 5.05 on ED  admission.  AKI appears multifactorial due to recent UTI infection, hypovolemia, and NSAID use.  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  Renal ultrasound negative for obstruction. Serologic workup pending: complements, SPEP/UPEP, anca, and kappa lights.  ANA negative Due to uremic symptoms of somnolence yesterday, appreciate vascular placing HD temp cath and patient received initial dialysis treatment.  Patient had increased urine output overnight, 2.8 L.  Will hold dialysis today and schedule next treatment for tomorrow.  Once stable, will consider renal biopsy.   Lab Results  Component Value Date   CREATININE 6.29 (H) 03/05/2022   CREATININE 7.28 (H) 03/04/2022   CREATININE 5.80 (H) 03/03/2022    Intake/Output Summary (Last 24 hours) at 03/05/2022 1143 Last data filed at 03/05/2022 1133 Gross per 24 hour  Intake 1409.19 ml  Output 3430 ml  Net -2020.81 ml    2. Hypertension, essential. Home regimen includes amlodipine, clonidine, isosorbide, lisinopril and metoprolol. Blood pressure remains acceptable for this patient    LOS: 3 Taniyah Ballow 1/28/202411:43 AM

## 2022-03-05 NOTE — Progress Notes (Signed)
  Progress Note   Patient: Cory Campbell ZCH:[id number] DOB: 20-Jan-1981 DOA: 03/02/2022     3 DOS: the patient was seen and examined on 03/05/2022   Brief hospital course:  Assessment and Plan:  * Hyperkalemia - Resolved  - Nephrology following    Metabolic acidosis - Nephrology consulted  - IV NS 125 cc/hr  - Sodium bicarb tablets 1300 mg PO bid    Lactic acidosis - IV fluids as above - Lactic acid stuck at 2.9  - Lactic acid in AM    AKI (acute kidney injury) ([place]) - IV fluids as above  - Dialysis per nephrology    Hypotension - Resolved  - PO linezolid 600 mg PO q12  - IV azithromycin 500 mg daily  - IV NS 125 cc/hr    HIV (human immunodeficiency virus infection) ([place]) - Biktarvy 1 tablet PO daily    Essential hypertension - Isordil 10 mg PO tid    Tobacco dependence - Nicotine patch 21 mg TD daily    Hyponatremia - Improving  - IV fluids as above    Weakness - Management as above    Seizure disorder (HCC) - IV ativan 2 mg PRN    Frequent falls - Fall precaution   Sleep apnea - CPAP nightly ordered   Insulin dependent type 2 diabetes mellitus (HCC) - Novolog tid and qhs  - Crestor 10 mg PO daily   E.faecalis/lactobacillus UTI - Linezolid as above    DVT prophylaxis: Heparin 5000 units sq q8hr      Subjective: Pt seen and examined at the bedside. Mentation is improved. Kidney function is improved s/p 1 session of dialysis. Na+ and CO2 are also improved. Urine cx growing e.faecalis/lactobacillus. Discussed with pharmacy and pt started on PO linezolid. Isordil added for HTN.  Physical Exam: Vitals:   03/04/22 2001 03/04/22 2353 03/05/22 0438 03/05/22 0800  BP: (!) 150/99 (!) 155/109 (!) 146/96 (!) 138/97  Pulse: (!) 102 96 100 100  Resp: 20 16 16 18   Temp: 98.3 F (36.8 C) 97.9 F (36.6 C) (!) 97.3 F (36.3 C) 97.9 F (36.6 C)  TempSrc: Oral Oral    SpO2: 100% 100% 100% 100%  Weight:      Height:       HENT:     Head:  Normocephalic.     Mouth/Throat:     Mouth: Mucous membranes are moist   Cardiovascular:     Rate and Rhythm: Intermittent tachycardia  Pulmonary:     Effort: Pulmonary effort is normal.  Abdominal:     General: Abdomen is flat.     Palpations: Abdomen is soft obese   Skin:    General: Skin is warm.  Neurological:     Comments: Alert and awake  Psychiatric:     Comments: Mood neutral   Data Reviewed:    Disposition: Status is: Inpatient  Planned Discharge Destination: Skilled nursing facility    Time spent: 35 minutes  Author: Lucienne Minks , MD 03/05/2022 10:50 AM  For on call review [email or web address].

## 2022-03-05 NOTE — Progress Notes (Signed)
Subjective: Interval History: none.  No complaints this morning.  Reports that he dialyzed without difficulty by his new catheter yesterday.  No pain.  He feels better overall.   Objective: Vital signs in last 24 hours: Temp:  [97.3 F (36.3 C)-98.3 F (36.8 C)] 97.9 F (36.6 C) (01/28 0800) Pulse Rate:  [93-105] 100 (01/28 0800) Resp:  [12-20] 18 (01/28 0800) BP: (136-160)/(96-131) 138/97 (01/28 0800) SpO2:  [100 %] 100 % (01/28 0800) FiO2 (%):  [21 %] 21 % (01/27 2255) Weight:  [77.2 kg-77.3 kg] 77.2 kg (01/27 1900)  Intake/Output from previous day: 01/27 0701 - 01/28 0700 In: 2412.1 [I.V.:1355.8; IV Piggyback:1056.4] Out: 2855 [Urine:2855] Intake/Output this shift: No intake/output data recorded.  General appearance: alert and no distress Neck: Right IJ catheter clean dry and intact.  No evidence of hematoma  Lab Results: Recent Labs    03/04/22 0506 03/05/22 0602  WBC 5.1 4.3  HGB 12.2* 12.5*  HCT 35.9* 36.9*  PLT 185 179   BMET Recent Labs    03/04/22 0506 03/05/22 0602  NA 129* 134*  K 5.0 4.5  CL 95* 101  CO2 17* 21*  GLUCOSE 87 96  BUN 78* 51*  CREATININE 7.28* 6.29*  CALCIUM 8.1* 7.8*    Studies/Results: DG Chest Port 1 View  Result Date: 03/04/2022 CLINICAL DATA:  Central line placement EXAM: PORTABLE CHEST 1 VIEW COMPARISON:  Chest x-ray March 02, 2022 FINDINGS: A new double lumen right central line has been placed, terminating in the right atrium, approximally 2.5 cm inferior to the caval atrial junction. The cardiomediastinal silhouette is stable. No pneumothorax. No other acute abnormalities. IMPRESSION: A new double lumen right central line terminates in the right atrium, approximally 2.5 cm inferior to the caval atrial junction. No pneumothorax. Electronically Signed   By: Dorise Bullion III M.D.   On: 03/04/2022 16:08   DG Chest Port 1 View  Result Date: 03/02/2022 CLINICAL DATA:  [id number] Altered mental status [id number] EXAM: PORTABLE CHEST -  1 VIEW COMPARISON:  12/24/2021 FINDINGS: Cardiac silhouette is unremarkable. No pneumothorax or pleural effusion. The lungs are clear. The visualized skeletal structures are unremarkable. IMPRESSION: No acute cardiopulmonary process. Electronically Signed   By: Sammie Bench M.D.   On: 03/02/2022 14:57   CT HEAD WO CONTRAST (5MM)  Result Date: 03/02/2022 CLINICAL DATA:  Altered mental status EXAM: CT HEAD WITHOUT CONTRAST TECHNIQUE: Contiguous axial images were obtained from the base of the skull through the vertex without intravenous contrast. RADIATION DOSE REDUCTION: This exam was performed according to the departmental dose-optimization program which includes automated exposure control, adjustment of the mA and/or kV according to patient size and/or use of iterative reconstruction technique. COMPARISON:  02/27/2022 FINDINGS: Brain: No evidence of acute infarction, hemorrhage, hydrocephalus, extra-axial collection or mass lesion/mass effect. Vascular: No hyperdense vessel or unexpected calcification. Skull: Normal. Negative for fracture or focal lesion. Sinuses/Orbits: No acute finding. IMPRESSION: No acute intracranial process. Electronically Signed   By: Sammie Bench M.D.   On: 03/02/2022 14:50   US RENAL  Result Date: 03/02/2022 CLINICAL DATA:  Acute renal failure. EXAM: RENAL / URINARY TRACT ULTRASOUND COMPLETE COMPARISON:  CT abdomen/pelvis 02/25/2022. FINDINGS: Right Kidney: Renal measurements: 12.3 x 7.4 x 6.3 = volume: 297.9. mL. Increased echogenicity. No mass or hydronephrosis. Left Kidney: Renal measurements: 10.4 x 7.3 x 6.1 = volume: 241.2 mL. Increased echogenicity. No mass or hydronephrosis. Bladder: Appears normal for degree of bladder distention. Other: None. IMPRESSION: Increased echogenicity in the bilateral kidneys  consistent with medical renal disease. Electronically Signed   By: Emmit Alexanders M.D.   On: 03/02/2022 13:47   CT Head Wo Contrast  Result Date:  02/27/2022 CLINICAL DATA:  Headache, sudden, severe EXAM: CT HEAD WITHOUT CONTRAST TECHNIQUE: Contiguous axial images were obtained from the base of the skull through the vertex without intravenous contrast. RADIATION DOSE REDUCTION: This exam was performed according to the departmental dose-optimization program which includes automated exposure control, adjustment of the mA and/or kV according to patient size and/or use of iterative reconstruction technique. COMPARISON:  12/24/2021 FINDINGS: Brain: No evidence of acute infarction, hemorrhage, hydrocephalus, extra-axial collection or mass lesion/mass effect. Vascular: No hyperdense vessel or unexpected calcification. Skull: Normal. Negative for fracture or focal lesion. Sinuses/Orbits: No acute finding. IMPRESSION: No acute intracranial process. Electronically Signed   By: Sammie Bench M.D.   On: 02/27/2022 16:09   CT ABDOMEN PELVIS WO CONTRAST  Result Date: 02/25/2022 CLINICAL DATA:  Left lower quadrant abdominal pain. EXAM: CT ABDOMEN AND PELVIS WITHOUT CONTRAST TECHNIQUE: Multidetector CT imaging of the abdomen and pelvis was performed following the standard protocol without IV contrast. RADIATION DOSE REDUCTION: This exam was performed according to the departmental dose-optimization program which includes automated exposure control, adjustment of the mA and/or kV according to patient size and/or use of iterative reconstruction technique. COMPARISON:  CT abdomen pelvis dated 12/24/2021. FINDINGS: Evaluation of this exam is limited in the absence of intravenous contrast. Lower chest: The visualized lung bases are clear. No intra-abdominal free air or free fluid. Hepatobiliary: The liver is unremarkable. No radiopaque a shin. Multiple gallstones. No pericholecystic fluid or evidence of acute cholecystitis by CT. Pancreas: Unremarkable. No pancreatic ductal dilatation or surrounding inflammatory changes. Spleen: Normal in size without focal abnormality.  Adrenals/Urinary Tract: The adrenal glands are unremarkable. There is no hydronephrosis or nephrolithiasis on either side. The visualized ureters appear unremarkable. Circumferential thickening of the bladder wall as seen on the prior CT, likely representing chronic infection or bladder dysfunction. Correlation with urinalysis recommended to evaluate for possibility of active cystitis. Stomach/Bowel: Moderate stool throughout the colon. There is scattered colonic diverticula without active inflammatory changes. There is no bowel obstruction or active inflammation. The appendix is not visualized with certainty. No inflammatory changes identified in the right lower quadrant. Vascular/Lymphatic: The abdominal aorta and IVC are grossly unremarkable on this noncontrast CT. No portal venous gas. There is no adenopathy. Reproductive: The prostate and seminal vesicles are grossly unremarkable no free gas. Other: Fatty atrophy of the pelvic musculature. Midline vertical anterior abdominal wall incisional scar. Focal area of induration of the subcutaneous soft tissues of the right anterior abdominal wall, likely related to subcutaneous injection or sequela of contusion. No fluid collection. 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 No acute osseous pathology. IMPRESSION: 1. No hydronephrosis or nephrolithiasis. 2. Circumferential thickening of the bladder wall as seen on the prior CT, likely representing chronic infection or neurogenic bladder dysfunction. Correlation with urinalysis recommended to evaluate for possibility of active cystitis. 3. Colonic diverticulosis. No bowel obstruction. 4. Cholelithiasis. Electronically Signed   By: Anner Crete M.D.   On: 02/25/2022 01:48   Anti-infectives: Anti-infectives (From admission, onward)    Start     Dose/Rate Route Frequency Ordered Stop   03/05/22 1800  ceFEPIme (MAXIPIME) 1 g in sodium chloride 0.9 % 100 mL IVPB  Status:  Discontinued        1  g 200 mL/hr over 30 Minutes Intravenous Every 24 hours 03/05/22 0836 03/05/22  8416   03/05/22 1000  linezolid (ZYVOX) tablet 600 mg        600 mg Oral Every 12 hours 03/05/22 0909     03/03/22 2200  acyclovir (ZOVIRAX) 200 MG capsule 200 mg        200 mg Oral 2 times daily 03/03/22 1011     03/03/22 1000  bictegravir-emtricitabine-tenofovir AF (BIKTARVY) 50-200-25 MG per tablet 1 tablet        1 tablet Oral Daily 03/02/22 1533     03/03/22 1000  acyclovir (ZOVIRAX) 200 MG capsule 400 mg  Status:  Discontinued        400 mg Oral 3 times daily 03/02/22 1827 03/03/22 1011   03/02/22 1600  metroNIDAZOLE (FLAGYL) IVPB 500 mg  Status:  Discontinued        500 mg 100 mL/hr over 60 Minutes Intravenous Every 12 hours 03/02/22 1520 03/05/22 0906   03/02/22 1545  vancomycin (VANCOREADY) IVPB 1750 mg/350 mL        1,750 mg 175 mL/hr over 120 Minutes Intravenous  Once 03/02/22 1530 03/02/22 2104   03/02/22 1545  ceFEPIme (MAXIPIME) 2 g in sodium chloride 0.9 % 100 mL IVPB  Status:  Discontinued        2 g 200 mL/hr over 30 Minutes Intravenous Every 24 hours 03/02/22 1532 03/05/22 0836   03/02/22 1545  azithromycin (ZITHROMAX) 500 mg in sodium chloride 0.9 % 250 mL IVPB        500 mg 250 mL/hr over 60 Minutes Intravenous Every 24 hours 03/02/22 1536 03/07/22 1544   03/02/22 1529  vancomycin variable dose per unstable renal function (pharmacist dosing)  Status:  Discontinued         Does not apply See admin instructions 03/02/22 1530 03/03/22 1010       Assessment/Plan: Acute kidney injury status post placement of temporary dialysis catheter. Catheter functioning well.  Will remain available for transition to more permanent access as necessary.   LOS: 3 days   Verda Cumins 03/05/2022, 10:24 AM

## 2022-03-06 DIAGNOSIS — E875 Hyperkalemia: Secondary | ICD-10-CM | POA: Diagnosis not present

## 2022-03-06 LAB — GLUCOSE, CAPILLARY
Glucose-Capillary: 55 mg/dL — ABNORMAL LOW (ref 70–99)
Glucose-Capillary: 72 mg/dL (ref 70–99)
Glucose-Capillary: 90 mg/dL (ref 70–99)
Glucose-Capillary: 62 mg/dL — ABNORMAL LOW (ref 70–99)
Glucose-Capillary: 116 mg/dL — ABNORMAL HIGH (ref 70–99)
Glucose-Capillary: 109 mg/dL — ABNORMAL HIGH (ref 70–99)

## 2022-03-06 LAB — ANCA PROFILE
Anti-MPO Antibodies: <0.2 units (ref 0.0–0.9)
Anti-PR3 Antibodies: <0.2 units (ref 0.0–0.9)
Atypical P-ANCA titer: <1:20 {titer}
C-ANCA: <1:20 {titer}
P-ANCA: <1:20 {titer}

## 2022-03-06 LAB — PROTIME-INR
INR: 1.2 (ref 0.8–1.2)
Prothrombin Time: 15.3 seconds — ABNORMAL HIGH (ref 11.4–15.2)

## 2022-03-06 LAB — KAPPA/LAMBDA LIGHT CHAINS
Kappa free light chain: 124.7 mg/L — ABNORMAL HIGH (ref 3.3–19.4)
Kappa, lambda light chain ratio: 2.38 — ABNORMAL HIGH (ref 0.26–1.65)
Lambda free light chains: 52.3 mg/L — ABNORMAL HIGH (ref 5.7–26.3)

## 2022-03-06 LAB — CBC
HCT: 35.2 % — ABNORMAL LOW (ref 39.0–52.0)
Hemoglobin: 11.9 g/dL — ABNORMAL LOW (ref 13.0–17.0)
MCH: 27 pg (ref 26.0–34.0)
MCHC: 33.8 g/dL (ref 30.0–36.0)
MCV: 79.8 fL — ABNORMAL LOW (ref 80.0–100.0)
Platelets: 194 10*3/uL (ref 150–400)
RBC: 4.41 MIL/uL (ref 4.22–5.81)
RDW: 15.3 % (ref 11.5–15.5)
WBC: 4.5 10*3/uL (ref 4.0–10.5)
nRBC: 0 % (ref 0.0–0.2)

## 2022-03-06 LAB — COMPREHENSIVE METABOLIC PANEL
ALT: 16 U/L (ref 0–44)
AST: 52 U/L — ABNORMAL HIGH (ref 15–41)
Albumin: 2.8 g/dL — ABNORMAL LOW (ref 3.5–5.0)
Alkaline Phosphatase: 67 U/L (ref 38–126)
Anion gap: 16 — ABNORMAL HIGH (ref 5–15)
BUN: 56 mg/dL — ABNORMAL HIGH (ref 6–20)
CO2: 20 mmol/L — ABNORMAL LOW (ref 22–32)
Calcium: 7.8 mg/dL — ABNORMAL LOW (ref 8.9–10.3)
Chloride: 103 mmol/L (ref 98–111)
Creatinine, Ser: 7.31 mg/dL — ABNORMAL HIGH (ref 0.61–1.24)
GFR, Estimated: 9 mL/min — ABNORMAL LOW (ref >60)
Glucose, Bld: 77 mg/dL (ref 70–99)
Potassium: 4.5 mmol/L (ref 3.5–5.1)
Sodium: 139 mmol/L (ref 135–145)
Total Bilirubin: 0.6 mg/dL (ref 0.3–1.2)
Total Protein: 5.9 g/dL — ABNORMAL LOW (ref 6.5–8.1)

## 2022-03-06 LAB — MAGNESIUM: Magnesium: 2.4 mg/dL (ref 1.7–2.4)

## 2022-03-06 LAB — C-REACTIVE PROTEIN: CRP: 18.7 mg/dL — ABNORMAL HIGH (ref <1.0)

## 2022-03-06 LAB — PHOSPHORUS: Phosphorus: 6.8 mg/dL — ABNORMAL HIGH (ref 2.5–4.6)

## 2022-03-06 LAB — LACTIC ACID, PLASMA: Lactic Acid, Venous: 2.4 mmol/L (ref 0.5–1.9)

## 2022-03-06 LAB — HEPATITIS B E ANTIBODY: Hep B E Ab: NEGATIVE

## 2022-03-06 MED ORDER — HYDRALAZINE HCL 25 MG PO TABS: 25.0000 mg | ORAL_TABLET | Freq: Three times a day (TID) | ORAL | Status: DC

## 2022-03-06 MED ORDER — HEPARIN SODIUM (PORCINE) 1000 UNIT/ML IJ SOLN
INTRAMUSCULAR | Status: AC
  Filled 2022-03-06: qty 10

## 2022-03-06 MED ORDER — HYDRALAZINE HCL 50 MG PO TABS
50.0000 mg | ORAL_TABLET | Freq: Three times a day (TID) | ORAL | Status: DC
  Administered 2022-03-06 – 2022-03-16 (×25): 50 mg via ORAL
  Filled 2022-03-06 (×25): qty 1

## 2022-03-06 MED ORDER — INSULIN ASPART 100 UNIT/ML IJ SOLN
0.0000 [IU] | Freq: Three times a day (TID) | INTRAMUSCULAR | Status: DC
  Administered 2022-03-07 – 2022-03-09 (×5): 1 [IU] via SUBCUTANEOUS
  Administered 2022-03-09: 4 [IU] via SUBCUTANEOUS
  Filled 2022-03-06 (×4): qty 1

## 2022-03-06 MED ORDER — AMOXICILLIN-POT CLAVULANATE 500-125 MG PO TABS
1.0000 | ORAL_TABLET | Freq: Every day | ORAL | Status: AC
  Administered 2022-03-06 – 2022-03-07 (×2): 1 via ORAL
  Filled 2022-03-06 (×2): qty 1

## 2022-03-06 MED ORDER — AMLODIPINE BESYLATE 10 MG PO TABS
10.0000 mg | ORAL_TABLET | Freq: Every evening | ORAL | Status: DC
  Administered 2022-03-06 – 2022-03-15 (×9): 10 mg via ORAL
  Filled 2022-03-06 (×9): qty 1

## 2022-03-06 MED ORDER — POLYETHYLENE GLYCOL 3350 17 G PO PACK
17.0000 g | PACK | Freq: Two times a day (BID) | ORAL | Status: DC
  Administered 2022-03-06 – 2022-03-16 (×16): 17 g via ORAL
  Filled 2022-03-06 (×18): qty 1

## 2022-03-06 MED ORDER — DOCUSATE SODIUM 100 MG PO CAPS
100.0000 mg | ORAL_CAPSULE | Freq: Two times a day (BID) | ORAL | Status: DC
  Administered 2022-03-06 – 2022-03-16 (×17): 100 mg via ORAL
  Filled 2022-03-06 (×17): qty 1

## 2022-03-06 MED ORDER — ISOSORBIDE DINITRATE 20 MG PO TABS
20.0000 mg | ORAL_TABLET | Freq: Three times a day (TID) | ORAL | Status: DC
  Administered 2022-03-06 – 2022-03-16 (×26): 20 mg via ORAL
  Filled 2022-03-06 (×32): qty 1

## 2022-03-06 NOTE — Progress Notes (Signed)
  Progress Note   Patient: Cory Campbell EXB:[id number] DOB: [birth year] DOA: 03/02/2022     4 DOS: the patient was seen and examined on 03/06/2022   Brief hospital course:  Assessment and Plan:  * Hyperkalemia - Resolved  - Nephrology following    Metabolic acidosis - Nephrology consulted  - IV NS 125 cc/hr  - Sodium bicarb tablets 1300 mg PO bid    Lactic acidosis - IV fluids as above - Lactic acid 2.4  - Lactic acid in AM    AKI (acute kidney injury) ([place]) - IV fluids as above  - Dialysis per nephrology    Hypotension - Resolved  - Augmentin 500 mg PO bid  - IV NS 125 cc/hr    HIV (human immunodeficiency virus infection) ([facility]) - Biktarvy 1 tablet PO daily    Essential hypertension - Isordil 20 mg PO tid  - Hydralazine 25 mg PO q8hr    Tobacco dependence - Nicotine patch 21 mg TD daily    Hyponatremia - Resolved    Weakness - Management as above    Seizure disorder ([facility]) - IV ativan 2 mg PRN    Frequent falls - Fall precaution   Sleep apnea - CPAP nightly ordered   Insulin dependent type 2 diabetes mellitus ([facility]) - Novolog tid and qhs  - Crestor 10 mg PO daily    E.faecalis/lactobacillus UTI - Augmentin 500 mg PO bid    DVT prophylaxis: Heparin 5000 units sq q8hr       Subjective: Pt seen and examined at the bedside. Kidney function worse today. Plan for another round of dialysis as per nephrology. Increase isordil to 20 mg PO tid and add hydralazine 25 mg PO q8hr.  Physical Exam: Vitals:   03/06/22 0841 03/06/22 1237 03/06/22 1304 03/06/22 1307  BP:  (!) 158/106    Pulse:  89    Resp: 17 16 12 15   Temp:  (!) 97.5 F (36.4 C)    TempSrc:      SpO2:  100%    Weight:      Height:       HENT:     Head: Normocephalic.     Mouth/Throat:     Mouth: Mucous membranes are moist   Cardiovascular:     Rate and Rhythm: Intermittent tachycardia  Pulmonary:     Effort: Pulmonary effort is normal.  Abdominal:     General: Abdomen is  flat.     Palpations: Abdomen is soft obese   Skin:    General: Skin is warm.  Neurological:     Comments: Alert and awake  Psychiatric:     Comments: Mood neutral   Data Reviewed:   Disposition: Status is: Inpatient  Planned Discharge Destination: Barriers to discharge: New dialysis     Time spent: 35 minutes  Author: Lucienne Minks , MD 03/06/2022 1:15 PM  For on call review [email or web address].

## 2022-03-06 NOTE — Progress Notes (Signed)
Pre hd rn assessment 

## 2022-03-06 NOTE — Progress Notes (Incomplete)
S: Tele reports pt had short bi/trigeminy run earlier in shift  B: Pt has hx of HF, Hypertensive Cardiomyopathy A: VSS, pt asymptomatic, resting. Pt continues to have frequent PVCs. R: Pt has repeat labs scheduled in the am: CBC, CMP, Lactic, Mag, Phosphorus    03/06/22 0012  Vitals  Temp 97.7 F (36.5 C)  BP 127/83  MAP (mmHg) 96  BP Location Left Arm  BP Method Automatic  Patient Position (if appropriate) Lying  Pulse Rate 95  Pulse Rate Source Monitor  Resp 16

## 2022-03-06 NOTE — Progress Notes (Signed)
Post hd rn assessment 

## 2022-03-06 NOTE — Progress Notes (Signed)
CROSS COVER NOTE  NAME: KORAY SOTER MRN: [id number] DOB : 11/15/1980 ATTENDING PHYSICIAN: Lucienne Minks, MD    Date of Service   03/06/2022   HPI/Events of Note   Message received form nursing reporting  ~2 min run of bigeminy and trigeminy around 2100. RN reports patient has had frequent PVCs since 03/04/22. Patient is hemodynamically stable and asymptomatic. BP 127/83 HR 95.  Interventions   Assessment/Plan:  Check electrolytes with AM labs Consider addition of beta blocker to reduce PVC burden when BP allows      To reach the provider On-Call:   7AM- 7PM see care teams to locate the attending and reach out to them via [email or web address] Password: [email or web address] 7PM-7AM contact night-coverage If you still have difficulty reaching the appropriate provider, please page the [facility] (Director on Call) for [facility] on amion for assistance  This document was prepared using Systems analyst and may include unintentional dictation errors.  Neomia Glass DNP, MBA, FNP-BC, PMHNP-BC Nurse Practitioner [facility] Pager [phone]

## 2022-03-06 NOTE — Progress Notes (Signed)
Pt completed 2.5 hour HD treatment w/ no complications. Alert, stable, no c/o, report to primary RN. Start: 1338 End: 1616 No fluid removed 37.5L BVP 75kg post hd bed weight

## 2022-03-06 NOTE — Plan of Care (Signed)

## 2022-03-06 NOTE — Progress Notes (Signed)
Inpatient Diabetes Program Recommendations  AACE/ADA: New Consensus Statement on Inpatient Glycemic Control (2015)  Target Ranges:  Prepandial:   less than 140 mg/dL      Peak postprandial:   less than 180 mg/dL (1-2 hours)      Critically ill patients:  140 - 180 mg/dL   Lab Results  Component Value Date   GLUCAP 72 03/06/2022   HGBA1C 7.8 (H) 09/09/2021    Review of Glycemic Control  Latest Reference Range & Units 03/05/22 16:08 03/05/22 21:50 03/06/22 07:59 03/06/22 08:35  Glucose-Capillary 70 - 99 mg/dL 72 80 55 (L) 72   Diabetes history: DM 2 Outpatient Diabetes medications:  Jardiance 25 mg daily Tresiba 100 units bid Metformin 0037 mg bid Trulicity 4.5 mg weekly Dexcom CGM- G7 Current orders for Inpatient glycemic control:  Novolog moderate tid with meals and HS  Inpatient Diabetes Program Recommendations:    Note low blood sugars possibly due to long acting insulin Tyler Aas still being active.  May consider reducing Novolog correction to the "very sensitive" 0-6 units  and increase frequency to q 4 hours.  Once consistently > 150 mg/dL, may need low dose basal insulin resumed. Will follow.   Thanks,  Adah Perl, RN, BC-ADM Inpatient Diabetes Coordinator Pager [phone]  (8a-5p)

## 2022-03-06 NOTE — Progress Notes (Signed)
[facility]  ROUNDING NOTE   Subjective:   Patient seen during dialysis Tolerating well    HEMODIALYSIS FLOWSHEET:  Blood Flow Rate (mL/min): 250 mL/min Arterial Pressure (mmHg): -70 mmHg Venous Pressure (mmHg): 70 mmHg TMP (mmHg): 7 mmHg Ultrafiltration Rate (mL/min): 280 mL/min Dialysate Flow Rate (mL/min): 300 ml/min Dialysis Fluid Bolus: Normal Saline   Denies any acute complaints Patient is aware of need for kidney biopsy soon.  Objective:  Vital signs in last 24 hours:  Temp:  [97.5 F (36.4 C)-98.4 F (36.9 C)] 98 F (36.7 C) (01/29 1616) Pulse Rate:  [79-97] 97 (01/29 1624) Resp:  [10-22] 10 (01/29 1627) BP: (127-182)/(83-121) 182/121 (01/29 1624) SpO2:  [98 %-100 %] 98 % (01/29 1624) FiO2 (%):  [21 %] 21 % (01/28 2206) Weight:  [74.9 kg-75 kg] 75 kg (01/29 1627)  Weight change:  Filed Weights   03/04/22 1900 03/06/22 1329 03/06/22 1627  Weight: 77.2 kg 74.9 kg 75 kg    Intake/Output: I/O last 3 completed shifts: In: 2108.4 [I.V.:1858.4; IV Piggyback:250] Out: 4503 [Urine:5430]   Intake/Output this shift:  Total I/O In: 120 [P.O.:120] Out: 1550 [Urine:1550]  Physical Exam: General: NAD  Head: Normocephalic, atraumatic. Moist oral mucosal membranes  Eyes: Anicteric,  Lungs:  Clear to auscultation, normal effort  Heart: Regular rate and rhythm  Abdomen:  Soft, nontender  Extremities:  No.  Peripheral edema.  Neurologic: Alert and oriented unable to answer simple questions.   Skin: No lesions  Access: None    Basic Metabolic Panel: Recent Labs  Lab 03/02/22 1733 03/03/22 0406 03/04/22 0506 03/05/22 0602 03/06/22 0449  NA 124* 125* 129* 134* 139  K 5.0 5.5* 5.0 4.5 4.5  CL 93* 92* 95* 101 103  CO2 17* 19* 17* 21* 20*  GLUCOSE 180* 112* 87 96 77  BUN 61* 65* 78* 51* 56*  CREATININE 5.00* 5.80* 7.28* 6.29* 7.31*  CALCIUM 8.9 8.6* 8.1* 7.8* 7.8*  MG  --   --  2.8* 2.4 2.4  PHOS  --   --  7.6* 7.1* 6.8*     Liver  Function Tests: Recent Labs  Lab 03/02/22 1208 03/04/22 0506 03/05/22 0602 03/06/22 0449  AST 24 34 52* 52*  ALT 16 13 17 16   ALKPHOS 72 61 69 67  BILITOT 0.7 0.8 0.8 0.6  PROT 7.3 5.8* 5.9* 5.9*  ALBUMIN 3.9 2.9* 2.8* 2.8*    No results for input(s): "LIPASE", "AMYLASE" in the last 168 hours. No results for input(s): "AMMONIA" in the last 168 hours.  CBC: Recent Labs  Lab 03/02/22 1208 03/03/22 0406 03/04/22 0506 03/05/22 0602 03/06/22 0449  WBC 7.4 6.5 5.1 4.3 4.5  NEUTROABS 5.2  --   --   --   --   HGB 14.9 12.9* 12.2* 12.5* 11.9*  HCT 44.2 38.0* 35.9* 36.9* 35.2*  MCV 81.1 80.0 80.5 79.2* 79.8*  PLT 261 207 185 179 194     Cardiac Enzymes: No results for input(s): "CKTOTAL", "CKMB", "CKMBINDEX", "TROPONINI" in the last 168 hours.  BNP: Invalid input(s): "POCBNP"  CBG: Recent Labs  Lab 03/05/22 1608 03/05/22 2150 03/06/22 0759 03/06/22 0835 03/06/22 1238  GLUCAP 72 80 55* 60 90     Microbiology: Results for orders placed or performed during the hospital encounter of 03/02/22  Culture, blood (Routine X 2) w Reflex to ID Panel     Status: None (Preliminary result)   Collection Time: 03/02/22  2:15 PM   Specimen: BLOOD  Result Value Ref  Range Status   Specimen Description BLOOD LEFT ANTECUBITAL  Final   Special Requests   Final    BOTTLES DRAWN AEROBIC AND ANAEROBIC Blood Culture adequate volume   Culture   Final    NO GROWTH 4 DAYS Performed at [facility], [address], [place], [place] [address]    Report Status PENDING  Incomplete  Culture, blood (Routine X 2) w Reflex to ID Panel     Status: None (Preliminary result)   Collection Time: 03/02/22  2:20 PM   Specimen: BLOOD  Result Value Ref Range Status   Specimen Description BLOOD RIGHT ANTECUBITAL  Final   Special Requests   Final    BOTTLES DRAWN AEROBIC AND ANAEROBIC Blood Culture adequate volume   Culture   Final    NO GROWTH 4 DAYS Performed at [facility],  [address] [place], [place] [address]    Report Status PENDING  Incomplete  Urine Culture (for pregnant, neutropenic or urologic patients or patients with an indwelling urinary catheter)     Status: Abnormal   Collection Time: 03/02/22  3:53 PM   Specimen: Urine, Clean Catch  Result Value Ref Range Status   Specimen Description   Final    URINE, CLEAN CATCH Performed at [facility], [address], [place], [place] [address]    Special Requests   Final    NONE Performed at [facility], [address] [place], [place] [address]    Culture (A)  Final    >=100,000 COLONIES/mL ENTEROCOCCUS FAECALIS >=100,000 COLONIES/mL LACTOBACILLUS SPECIES Standardized susceptibility testing for this organism is not available. Performed at [facility], [place] [address] [place], [place] [address]    Report Status 03/05/2022 FINAL  Final   Organism ID, Bacteria ENTEROCOCCUS FAECALIS (A)  Final      Susceptibility   Enterococcus faecalis - MIC*    AMPICILLIN <=2 SENSITIVE Sensitive     NITROFURANTOIN <=16 SENSITIVE Sensitive     VANCOMYCIN 1 SENSITIVE Sensitive     * >=100,000 COLONIES/mL ENTEROCOCCUS FAECALIS  MRSA Next Gen by PCR, Nasal     Status: None   Collection Time: 03/03/22  6:05 AM   Specimen: Nasal Mucosa; Nasal Swab  Result Value Ref Range Status   MRSA by PCR Next Gen NOT DETECTED NOT DETECTED Final    Comment: (NOTE) The GeneXpert MRSA Assay (FDA approved for NASAL specimens only), is one component of a comprehensive MRSA colonization surveillance program. It is not intended to diagnose MRSA infection nor to guide or monitor treatment for MRSA infections. Test performance is not FDA approved in patients less than 27 years old. Performed at [facility], [address] [place], [place] [address]     Coagulation Studies: No results for input(s): "LABPROT", "INR" in the last 72 hours.  Urinalysis: No results for input(s):  "COLORURINE", "LABSPEC", "PHURINE", "GLUCOSEU", "HGBUR", "BILIRUBINUR", "KETONESUR", "PROTEINUR", "UROBILINOGEN", "NITRITE", "LEUKOCYTESUR" in the last 72 hours.  Invalid input(s): "APPERANCEUR"     Imaging: CT ABDOMEN PELVIS WO CONTRAST  Result Date: 03/05/2022 CLINICAL DATA:  Right lower quadrant abdominal pain. EXAM: CT ABDOMEN AND PELVIS WITHOUT CONTRAST TECHNIQUE: Multidetector CT imaging of the abdomen and pelvis was performed following the standard protocol without IV contrast. RADIATION DOSE REDUCTION: This exam was performed according to the departmental dose-optimization program which includes automated exposure control, adjustment of the mA and/or kV according to patient size and/or use of iterative reconstruction technique. COMPARISON:  CT of the abdomen  and pelvis without contrast 02/25/2022 FINDINGS: Lower chest: The lung bases are clear. Heart size is normal. Minimal pericardial fluid is likely within normal limits. Hepatobiliary: The liver is unremarkable. Layering gallstones are present at the neck of the gallbladder without significant inflammatory changes about the gallbladder. The common bile duct is within normal limits. Pancreas: Unremarkable. No pancreatic ductal dilatation or surrounding inflammatory changes. Spleen: Normal in size without focal abnormality. Adrenals/Urinary Tract: The adrenal glands are normal bilaterally. Kidneys are unremarkable. No stone or mass lesion is present. The ureters are within normal limits bilaterally. Circumferential thickening of the bladder wall is again noted. Stomach/Bowel: The stomach and duodenum within normal limits. Small bowel is unremarkable. Terminal ileum is within normal limits. Contrast is present with throughout the colon. Diverticular changes are present the ascending transverse colon without focal inflammation. The colon is incompletely distended. Moderate stool is present in the sigmoid colon. There is marked thickening of the rectal  mucosa without a discrete mass. Vascular/Lymphatic: No significant vascular findings are present. No enlarged abdominal or pelvic lymph nodes. Reproductive: Prostate gland and seminal vesicles are within normal limits. Other: No abdominal wall hernia or abnormality. No abdominopelvic ascites. Subcutaneous inflammatory changes over the lower abdomen likely reflect injection sites. Musculoskeletal: Vertebral body heights and alignment are normal. Chronic bilateral hip dislocations are noted. No other acute or focal osseous abnormalities are present. IMPRESSION: 1. Marked thickening of the rectal mucosa without a discrete is most likely inflammatory. No discrete mass lesion is present. Correlate with exam. 2. Colonic diverticulosis without diverticulitis. 3. Extensive bladder wall thickening again noted. This may reflect chronic cystitis. 4. Cholelithiasis without evidence for cholecystitis. 5. Chronic bilateral hip dislocations. Electronically Signed   By: Marin Roberts M.D.   On: 03/05/2022 13:40     Medications:    sodium chloride Stopped (03/05/22 1419)   anticoagulant sodium citrate     azithromycin Stopped (03/05/22 1523)    acyclovir  200 mg Oral BID   amoxicillin-clavulanate  1 tablet Oral Daily   aspirin EC  81 mg Oral Daily   bictegravir-emtricitabine-tenofovir AF  1 tablet Oral Daily   Chlorhexidine Gluconate Cloth  6 each Topical Q0600   docusate sodium  100 mg Oral BID   heparin  5,000 Units Subcutaneous Q8H   heparin sodium (porcine)       hydrALAZINE  25 mg Oral Q8H   insulin aspart  0-6 Units Subcutaneous TID WC   isosorbide dinitrate  20 mg Oral TID   polyethylene glycol  17 g Oral BID   rosuvastatin  10 mg Oral QHS   sodium bicarbonate  1,300 mg Oral BID   acetaminophen **OR** acetaminophen, albuterol, alteplase, anticoagulant sodium citrate, cyclobenzaprine, heparin, heparin sodium (porcine), lidocaine (PF), lidocaine-prilocaine, LORazepam, melatonin, nicotine,  nitroGLYCERIN, ondansetron **OR** ondansetron (ZOFRAN) IV, pentafluoroprop-tetrafluoroeth, senna-docusate  Assessment/ Plan:  Cory Campbell is a 42 y.o.  male with past medical conditions including depression, HIV, hyperlipidemia, hypertension, BPH, diabetes who was admitted to [facility] on 03/02/2022 for Hyperkalemia [E87.5] Hypoglycemia [E16.2] Acute renal failure, unspecified acute renal failure type (HCC) [N17.9]   Acute kidney injury with proteinuria/hyperkalemia/hyponatremia.  Creatinine 5.05 on ED admission.  AKI appears multifactorial due to recent UTI infection, hypovolemia, and NSAID use.  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  Other differential includes HIV-associated nephropathy.  Patient also has underlying insulin-dependent diabetes.  Hemoglobin A1c 7.8% from 09/09/2021.   UPC ratio 3.95  on 03/04/2022.  Renal ultrasound negative for obstruction.  Serologic workup complements-C3-C4 normal.  ANA negative.,  Following results are pending: SPEP/UPEP, anca, and kappa lights.   Baseline creatinine of 0.78 on November 28, 2021. Patient presented with uremic symptoms therefore was initiated on hemodialysis.  Serum creatinine remains very high at 7.3.  Await serological evaluation.  However, good urine output noted of 2900 cc yesterday.  Kidney biopsy ordered for tomorrow.  Patient aware of the need and agrees to proceed. Evaluate daily for need of additional dialysis.   Lab Results  Component Value Date   CREATININE 7.31 (H) 03/06/2022   CREATININE 6.29 (H) 03/05/2022   CREATININE 7.28 (H) 03/04/2022    Intake/Output Summary (Last 24 hours) at 03/06/2022 1656 Last data filed at 03/06/2022 1629 Gross per 24 hour  Intake 2228.39 ml  Output 3550 ml  Net -1321.61 ml    2. Hypertension, essential. Home regimen includes amlodipine, clonidine, isosorbide, lisinopril and metoprolol. Blood pressure high today.  Will restart amlodipine.  Increase hydralazine  to 50 mg 3 times daily.    LOS: 4 Amala Petion Thedore Mins 1/29/20244:56 PM

## 2022-03-06 NOTE — TOC Progression Note (Signed)
Transition of Care [facility]) - Progression Note    Patient Details  Name: [name] MRN: [id number] Date of Birth: 01-10-1981  Transition of Care [facility]) CM/SW Contact  Laurena Slimmer, RN Phone Number: 03/06/2022, 2:59 PM  Clinical Narrative:    Case reviewed for needs and disposition.     Expected Discharge Plan: Home/Self Care Barriers to Discharge: Continued Medical Work up  Expected Discharge Plan and Services       Living arrangements for the past 2 months: Single Family Home                                       Social Determinants of Health (SDOH) Interventions SDOH Screenings   Food Insecurity: No Food Insecurity (11/24/2021)  Housing: Low Risk  (11/24/2021)  Transportation Needs: No Transportation Needs (11/24/2021)  Utilities: Not At Risk (11/24/2021)  Tobacco Use: High Risk (03/02/2022)    Readmission Risk Interventions    03/03/2022    4:01 PM 09/11/2021   10:27 AM  Readmission Risk Prevention Plan  Transportation Screening Complete Complete  PCP or Specialist Appt within 3-5 Days  Complete  HRI or [place]  Complete  Social Work Consult for [place] Planning/Counseling  Complete  Palliative Care Screening  Not Applicable  Medication Review Press photographer) Complete Complete  PCP or Specialist appointment within 3-5 days of discharge Complete   HRI or [place] Complete   SW Recovery Care/Counseling Consult Complete   [place] Not Applicable

## 2022-03-07 DIAGNOSIS — E875 Hyperkalemia: Secondary | ICD-10-CM | POA: Diagnosis not present

## 2022-03-07 LAB — CBC
HCT: 36.9 % — ABNORMAL LOW (ref 39.0–52.0)
Hemoglobin: 12.5 g/dL — ABNORMAL LOW (ref 13.0–17.0)
MCH: 27 pg (ref 26.0–34.0)
MCHC: 33.9 g/dL (ref 30.0–36.0)
MCV: 79.7 fL — ABNORMAL LOW (ref 80.0–100.0)
Platelets: 196 10*3/uL (ref 150–400)
RBC: 4.63 MIL/uL (ref 4.22–5.81)
RDW: 15.3 % (ref 11.5–15.5)
WBC: 4.5 10*3/uL (ref 4.0–10.5)
nRBC: 0 % (ref 0.0–0.2)

## 2022-03-07 LAB — GLUCOSE, CAPILLARY
Glucose-Capillary: 94 mg/dL (ref 70–99)
Glucose-Capillary: 103 mg/dL — ABNORMAL HIGH (ref 70–99)
Glucose-Capillary: 169 mg/dL — ABNORMAL HIGH (ref 70–99)
Glucose-Capillary: 164 mg/dL — ABNORMAL HIGH (ref 70–99)

## 2022-03-07 LAB — COMPREHENSIVE METABOLIC PANEL
ALT: 19 U/L (ref 0–44)
AST: 51 U/L — ABNORMAL HIGH (ref 15–41)
Albumin: 2.9 g/dL — ABNORMAL LOW (ref 3.5–5.0)
Alkaline Phosphatase: 76 U/L (ref 38–126)
Anion gap: 11 (ref 5–15)
BUN: 32 mg/dL — ABNORMAL HIGH (ref 6–20)
CO2: 23 mmol/L (ref 22–32)
Calcium: 7.9 mg/dL — ABNORMAL LOW (ref 8.9–10.3)
Chloride: 103 mmol/L (ref 98–111)
Creatinine, Ser: 5.56 mg/dL — ABNORMAL HIGH (ref 0.61–1.24)
GFR, Estimated: 12 mL/min — ABNORMAL LOW (ref >60)
Glucose, Bld: 96 mg/dL (ref 70–99)
Potassium: 3.6 mmol/L (ref 3.5–5.1)
Sodium: 137 mmol/L (ref 135–145)
Total Bilirubin: 0.8 mg/dL (ref 0.3–1.2)
Total Protein: 6.4 g/dL — ABNORMAL LOW (ref 6.5–8.1)

## 2022-03-07 LAB — CULTURE, BLOOD (ROUTINE X 2)
Culture: NO GROWTH
Culture: NO GROWTH
Special Requests: ADEQUATE
Special Requests: ADEQUATE

## 2022-03-07 LAB — PHOSPHORUS: Phosphorus: 4.2 mg/dL (ref 2.5–4.6)

## 2022-03-07 LAB — C-REACTIVE PROTEIN: CRP: 14.6 mg/dL — ABNORMAL HIGH (ref <1.0)

## 2022-03-07 LAB — LACTIC ACID, PLASMA: Lactic Acid, Venous: 1.5 mmol/L (ref 0.5–1.9)

## 2022-03-07 LAB — MAGNESIUM: Magnesium: 2.2 mg/dL (ref 1.7–2.4)

## 2022-03-07 LAB — PARATHYROID HORMONE, INTACT (NO CA): PTH: 69 pg/mL — ABNORMAL HIGH (ref 15–65)

## 2022-03-07 MED ORDER — HYDRALAZINE HCL 20 MG/ML IJ SOLN: 10.0000 mg | INTRAMUSCULAR | Status: DC | PRN

## 2022-03-07 MED ORDER — LIDOCAINE 5 % EX PTCH
1.0000 | MEDICATED_PATCH | CUTANEOUS | Status: DC
  Administered 2022-03-07 – 2022-03-16 (×10): 1 via TRANSDERMAL
  Filled 2022-03-07 (×10): qty 1

## 2022-03-07 MED ORDER — OXYCODONE HCL 5 MG PO TABS
5.0000 mg | ORAL_TABLET | Freq: Once | ORAL | Status: AC
  Administered 2022-03-07: 5 mg via ORAL
  Filled 2022-03-07: qty 1

## 2022-03-07 NOTE — Progress Notes (Signed)
Telemetry reported patient experienced trigeminy PVCs at 1940. Patient asymptomatic with no complaints of pain or discomfort and vitals obtained: BP 143/100, HR 80, RR 16 and O2 at 100%. Provider notified and no new orders received.

## 2022-03-07 NOTE — Progress Notes (Signed)
Request received for random renal biopsy for AKI. After review, pt last ASA dose was 03/06/22. Due to ASA being a 5 day hold for bleeding risk, biopsy cannot be safely performed until 03/11/22. Requesting service made aware if pt will be d/c prior, renal biopsy can be done as outpatient 03/13/22 or after.     Narda Rutherford, AGNP-BC 03/07/2022, 11:32 AM

## 2022-03-07 NOTE — Progress Notes (Signed)
  Progress Note   Patient: Cory Campbell RSW:[id number] DOB: 04-07-1980 DOA: 03/02/2022     5 DOS: the patient was seen and examined on 03/07/2022   Brief hospital course:  Assessment and Plan:  * Hyperkalemia - Resolved  - Nephrology following    Metabolic acidosis - Nephrology consulted  - Sodium bicarb tablets 1300 mg PO bid    Lactic acidosis - Resolved    AKI (acute kidney injury) ([facility] [place]) - Dialysis per nephrology  - ASA stopped such that renal biopsy can proceed in 5 days    Hypotension - Resolved  - Augmentin 500 mg PO bid    HIV (human immunodeficiency virus infection) (HCC) - Biktarvy 1 tablet PO daily  - Acyclovir 200 mg PO bid    Essential hypertension - Isordil 20 mg PO tid  - Hydralazine 50 mg PO q8hr  - Norvasc 10 mg PO nightly    Tobacco dependence - Nicotine patch 21 mg TD daily    Hyponatremia - Resolved    Weakness - Management as above    Seizure disorder (HCC) - IV ativan 2 mg PRN    Frequent falls - Fall precaution   Sleep apnea - CPAP nightly ordered   Insulin dependent type 2 diabetes mellitus (HCC) - Novolog tid and qhs  - Crestor 10 mg PO daily    E.faecalis/lactobacillus UTI - Augmentin 500 mg PO bid    DVT prophylaxis: Heparin 5000 units sq q8hr       Subjective: Pt seen and examined at the bedside. ASA discontinued as radiology requested 5 days with no aspirin. Pt continues on dialysis per nephrology service.    Physical Exam: Vitals:   03/07/22 0406 03/07/22 0902 03/07/22 0920 03/07/22 1116  BP: (!) 159/116  (!) 153/101 132/84  Pulse: (!) 101  99 98  Resp: 18  18 18   Temp: 98.5 F (36.9 C)  98.2 F (36.8 C) 98.2 F (36.8 C)  TempSrc:      SpO2: 100%  100% 100%  Weight:  70.7 kg    Height:       HENT:     Head: Normocephalic.     Mouth/Throat:     Mouth: Mucous membranes are moist   Cardiovascular:     Rate and Rhythm: Intermittent tachycardia  Pulmonary:     Effort: Pulmonary effort is normal.   Abdominal:     General: Abdomen is flat.     Palpations: Abdomen is soft obese   Skin:    General: Skin is warm.  Neurological:     Comments: Alert and awake  Psychiatric:     Comments: Mood neutral   Data Reviewed:   Disposition: Status is: Inpatient  Planned Discharge Destination: Skilled nursing facility    Time spent: 35 minutes  Author: Lucienne Minks , MD 03/07/2022 2:08 PM  For on call review [email or web address].

## 2022-03-07 NOTE — Progress Notes (Signed)
CROSS COVER NOTE  NAME: KADARIUS CUFFE MRN: [id number] DOB : Jun 14, 1980 ATTENDING PHYSICIAN: Lucienne Minks, MD    Date of Service   03/07/2022   HPI/Events of Note   Medication report received for patient report of back pain not relieved by Flexeril.  Interventions   Assessment/Plan:  Oxycodone x1 Lidocaine patch Kpad       To reach the provider On-Call:   7AM- 7PM see care teams to locate the attending and reach out to them via [email or web address] Password: [email or web address] 7PM-7AM contact night-coverage If you still have difficulty reaching the appropriate provider, please page the [facility] (Director on Call) for [facility] on amion for assistance  This document was prepared using Systems analyst and may include unintentional dictation errors.  Neomia Glass DNP, MBA, FNP-BC, PMHNP-BC Nurse Practitioner [facility] Pager [phone]

## 2022-03-07 NOTE — Progress Notes (Signed)
Patient has new yellow MEWS score. Provider notified. Patient stable with no complaints at this time. Vitals as follows: BP 135/97, MAP 107, HR 115, RR 17, O2 100% on CPAP.

## 2022-03-07 NOTE — Progress Notes (Signed)
[facility]  ROUNDING NOTE   Subjective:   Patient seen at bedside.  Denies any complaints.  Mouth appears dry. Starting to have more urine output.   Objective:  Vital signs in last 24 hours:  Temp:  [97.6 F (36.4 C)-98.5 F (36.9 C)] 97.6 F (36.4 C) (01/30 1656) Pulse Rate:  [80-115] 102 (01/30 1656) Resp:  [16-18] 16 (01/30 1656) BP: (132-159)/(84-116) 142/99 (01/30 1656) SpO2:  [100 %] 100 % (01/30 1656) FiO2 (%):  [21 %] 21 % (01/30 0016) Weight:  [70.7 kg] 70.7 kg (01/30 0902)  Weight change:  Filed Weights   03/06/22 1329 03/06/22 1627 03/07/22 0902  Weight: 74.9 kg 75 kg 70.7 kg    Intake/Output: I/O last 3 completed shifts: In: 2802.9 [P.O.:600; I.V.:1858.4; IV Piggyback:344.5] Out: 5500 [Urine:5500]   Intake/Output this shift:  Total I/O In: 240 [P.O.:240] Out: 2075 [Urine:2075]  Physical Exam: General: NAD  Head: Normocephalic, atraumatic. Moist oral mucosal membranes  Eyes: Anicteric,  Lungs:  Clear to auscultation, normal effort  Heart: Regular rate and rhythm  Abdomen:  Soft, nontender  Extremities:  No.  Peripheral edema.  Neurologic: Alert and oriented unable to answer simple questions.   Skin: No lesions  Access: Right IJ nontunneled catheter.    Basic Metabolic Panel: Recent Labs  Lab 03/03/22 0406 03/04/22 0506 03/05/22 0602 03/06/22 0449 03/07/22 0423  NA 125* 129* 134* 139 137  K 5.5* 5.0 4.5 4.5 3.6  CL 92* 95* 101 103 103  CO2 19* 17* 21* 20* 23  GLUCOSE 112* 87 96 77 96  BUN 65* 78* 51* 56* 32*  CREATININE 5.80* 7.28* 6.29* 7.31* 5.56*  CALCIUM 8.6* 8.1* 7.8* 7.8* 7.9*  MG  --  2.8* 2.4 2.4 2.2  PHOS  --  7.6* 7.1* 6.8* 4.2     Liver Function Tests: Recent Labs  Lab 03/02/22 1208 03/04/22 0506 03/05/22 0602 03/06/22 0449 03/07/22 0423  AST 24 34 52* 52* 51*  ALT 16 13 17 16 19   ALKPHOS 72 61 69 67 76  BILITOT 0.7 0.8 0.8 0.6 0.8  PROT 7.3 5.8* 5.9* 5.9* 6.4*  ALBUMIN 3.9 2.9* 2.8* 2.8* 2.9*     No results for input(s): "LIPASE", "AMYLASE" in the last 168 hours. No results for input(s): "AMMONIA" in the last 168 hours.  CBC: Recent Labs  Lab 03/02/22 1208 03/03/22 0406 03/04/22 0506 03/05/22 0602 03/06/22 0449 03/07/22 0423  WBC 7.4 6.5 5.1 4.3 4.5 4.5  NEUTROABS 5.2  --   --   --   --   --   HGB 14.9 12.9* 12.2* 12.5* 11.9* 12.5*  HCT 44.2 38.0* 35.9* 36.9* 35.2* 36.9*  MCV 81.1 80.0 80.5 79.2* 79.8* 79.7*  PLT 261 207 185 179 194 196     Cardiac Enzymes: No results for input(s): "CKTOTAL", "CKMB", "CKMBINDEX", "TROPONINI" in the last 168 hours.  BNP: Invalid input(s): "POCBNP"  CBG: Recent Labs  Lab 03/06/22 1759 03/06/22 2105 03/07/22 0921 03/07/22 1117 03/07/22 1657  GLUCAP 116* 109* 94 103* 169*     Microbiology: Results for orders placed or performed during the hospital encounter of 03/02/22  Culture, blood (Routine X 2) w Reflex to ID Panel     Status: None   Collection Time: 03/02/22  2:15 PM   Specimen: BLOOD  Result Value Ref Range Status   Specimen Description BLOOD LEFT ANTECUBITAL  Final   Special Requests   Final    BOTTLES DRAWN AEROBIC AND ANAEROBIC Blood Culture adequate volume  Culture   Final    NO GROWTH 5 DAYS Performed at [facility], [address] [place], [place] [address]    Report Status 03/07/2022 FINAL  Final  Culture, blood (Routine X 2) w Reflex to ID Panel     Status: None   Collection Time: 03/02/22  2:20 PM   Specimen: BLOOD  Result Value Ref Range Status   Specimen Description BLOOD RIGHT ANTECUBITAL  Final   Special Requests   Final    BOTTLES DRAWN AEROBIC AND ANAEROBIC Blood Culture adequate volume   Culture   Final    NO GROWTH 5 DAYS Performed at [facility], [address], [place], [place] [address]    Report Status 03/07/2022 FINAL  Final  Urine Culture (for pregnant, neutropenic or urologic patients or patients with an indwelling urinary catheter)     Status: Abnormal    Collection Time: 03/02/22  3:53 PM   Specimen: Urine, Clean Catch  Result Value Ref Range Status   Specimen Description   Final    URINE, CLEAN CATCH Performed at [facility], [address], [place], [place] [address]    Special Requests   Final    NONE Performed at [facility], [address] [place], [place] [address]    Culture (A)  Final    >=100,000 COLONIES/mL ENTEROCOCCUS FAECALIS >=100,000 COLONIES/mL LACTOBACILLUS SPECIES Standardized susceptibility testing for this organism is not available. Performed at [facility], [place] [address] [place], [place] [address]    Report Status 03/05/2022 FINAL  Final   Organism ID, Bacteria ENTEROCOCCUS FAECALIS (A)  Final      Susceptibility   Enterococcus faecalis - MIC*    AMPICILLIN <=2 SENSITIVE Sensitive     NITROFURANTOIN <=16 SENSITIVE Sensitive     VANCOMYCIN 1 SENSITIVE Sensitive     * >=100,000 COLONIES/mL ENTEROCOCCUS FAECALIS  MRSA Next Gen by PCR, Nasal     Status: None   Collection Time: 03/03/22  6:05 AM   Specimen: Nasal Mucosa; Nasal Swab  Result Value Ref Range Status   MRSA by PCR Next Gen NOT DETECTED NOT DETECTED Final    Comment: (NOTE) The GeneXpert MRSA Assay (FDA approved for NASAL specimens only), is one component of a comprehensive MRSA colonization surveillance program. It is not intended to diagnose MRSA infection nor to guide or monitor treatment for MRSA infections. Test performance is not FDA approved in patients less than 12 years old. Performed at [facility], [address] [place], [place] [address]     Coagulation Studies: Recent Labs    03/06/22 1726  LABPROT 15.3*  INR 1.2    Urinalysis: No results for input(s): "COLORURINE", "LABSPEC", "PHURINE", "GLUCOSEU", "HGBUR", "BILIRUBINUR", "KETONESUR", "PROTEINUR", "UROBILINOGEN", "NITRITE", "LEUKOCYTESUR" in the last 72 hours.  Invalid input(s): "APPERANCEUR"     Imaging: No results  found.   Medications:    anticoagulant sodium citrate      acyclovir  200 mg Oral BID   amLODipine  10 mg Oral QPM   amoxicillin-clavulanate  1 tablet Oral Daily   bictegravir-emtricitabine-tenofovir AF  1 tablet Oral Daily   Chlorhexidine Gluconate Cloth  6 each Topical Q0600   docusate sodium  100 mg Oral BID   heparin  5,000 Units Subcutaneous Q8H   hydrALAZINE  50 mg Oral TID   insulin aspart  0-6 Units Subcutaneous TID WC   isosorbide dinitrate  20 mg Oral TID   lidocaine  1 patch Transdermal Q24H  polyethylene glycol  17 g Oral BID   rosuvastatin  10 mg Oral QHS   sodium bicarbonate  1,300 mg Oral BID   albuterol, alteplase, anticoagulant sodium citrate, cyclobenzaprine, heparin, hydrALAZINE, lidocaine (PF), lidocaine-prilocaine, LORazepam, melatonin, nicotine, nitroGLYCERIN, pentafluoroprop-tetrafluoroeth, senna-docusate  Assessment/ Plan:  [name] is a 42 y.o.  male with past medical conditions including depression, HIV, hyperlipidemia, hypertension, BPH, diabetes who was admitted to [facility] on 03/02/2022 for Hyperkalemia [E87.5] Hypoglycemia [E16.2] Acute renal failure, unspecified acute renal failure type ([place]) [N17.9]   Acute kidney injury with proteinuria/hyperkalemia/hyponatremia.  Creatinine 5.05 on ED admission.  AKI appears multifactorial due to recent UTI infection, hypovolemia, and NSAID use.  NSAID use can cause glomular disease and FSGS, resulting in proteinuria.  Other differential includes HIV-associated nephropathy.  Patient also has underlying insulin-dependent diabetes.  Hemoglobin A1c 7.8% from 09/09/2021.   UPC ratio 3.95  on 03/04/2022.  Renal ultrasound negative for obstruction. Serologic workup complements-C3-C4 normal.  ANA negative,  anca, NEG. kappa /Lambda ration mildly elevated.   Baseline creatinine of 0.78 on November 28, 2021. Patient presented with uremic symptoms therefore was initiated on hemodialysis.   good urine output noted of 3500  cc yesterday.  Kidney biopsy deferred due to patient being on aspirin.  Will hold further hemodialysis due to improved urine output.  Will continue to evaluate daily for need of additional dialysis.   Lab Results  Component Value Date   CREATININE 5.56 (H) 03/07/2022   CREATININE 7.31 (H) 03/06/2022   CREATININE 6.29 (H) 03/05/2022    Intake/Output Summary (Last 24 hours) at 03/07/2022 1842 Last data filed at 03/07/2022 1659 Gross per 24 hour  Intake 480 ml  Output 3725 ml  Net -3245 ml    2. Hypertension, essential. Home regimen includes amlodipine, clonidine, isosorbide, lisinopril and metoprolol. Blood pressure in the 130s to 150s range now.    LOS: [place] 1/30/20246:42 PM

## 2022-03-07 NOTE — Plan of Care (Signed)

## 2022-03-08 DIAGNOSIS — B20 Human immunodeficiency virus [HIV] disease: Secondary | ICD-10-CM | POA: Diagnosis not present

## 2022-03-08 DIAGNOSIS — F17200 Nicotine dependence, unspecified, uncomplicated: Secondary | ICD-10-CM | POA: Diagnosis not present

## 2022-03-08 DIAGNOSIS — E875 Hyperkalemia: Secondary | ICD-10-CM | POA: Diagnosis not present

## 2022-03-08 DIAGNOSIS — N179 Acute kidney failure, unspecified: Secondary | ICD-10-CM

## 2022-03-08 LAB — CBC
HCT: 39.7 % (ref 39.0–52.0)
Hemoglobin: 13.3 g/dL (ref 13.0–17.0)
MCH: 26.7 pg (ref 26.0–34.0)
MCHC: 33.5 g/dL (ref 30.0–36.0)
MCV: 79.7 fL — ABNORMAL LOW (ref 80.0–100.0)
Platelets: 228 10*3/uL (ref 150–400)
RBC: 4.98 MIL/uL (ref 4.22–5.81)
RDW: 15.9 % — ABNORMAL HIGH (ref 11.5–15.5)
WBC: 5.4 10*3/uL (ref 4.0–10.5)
nRBC: 0 % (ref 0.0–0.2)

## 2022-03-08 LAB — COMPREHENSIVE METABOLIC PANEL
ALT: 21 U/L (ref 0–44)
AST: 44 U/L — ABNORMAL HIGH (ref 15–41)
Albumin: 3.2 g/dL — ABNORMAL LOW (ref 3.5–5.0)
Alkaline Phosphatase: 84 U/L (ref 38–126)
Anion gap: 13 (ref 5–15)
BUN: 40 mg/dL — ABNORMAL HIGH (ref 6–20)
CO2: 22 mmol/L (ref 22–32)
Calcium: 8.7 mg/dL — ABNORMAL LOW (ref 8.9–10.3)
Chloride: 101 mmol/L (ref 98–111)
Creatinine, Ser: 6.66 mg/dL — ABNORMAL HIGH (ref 0.61–1.24)
GFR, Estimated: 10 mL/min — ABNORMAL LOW (ref >60)
Glucose, Bld: 145 mg/dL — ABNORMAL HIGH (ref 70–99)
Potassium: 3.7 mmol/L (ref 3.5–5.1)
Sodium: 136 mmol/L (ref 135–145)
Total Bilirubin: 1.2 mg/dL (ref 0.3–1.2)
Total Protein: 7.1 g/dL (ref 6.5–8.1)

## 2022-03-08 LAB — GLUCOSE, CAPILLARY
Glucose-Capillary: 165 mg/dL — ABNORMAL HIGH (ref 70–99)
Glucose-Capillary: 200 mg/dL — ABNORMAL HIGH (ref 70–99)
Glucose-Capillary: 192 mg/dL — ABNORMAL HIGH (ref 70–99)
Glucose-Capillary: 232 mg/dL — ABNORMAL HIGH (ref 70–99)

## 2022-03-08 LAB — PROTEIN ELECTRO, RANDOM URINE
Albumin ELP, Urine: 13.3 %
Alpha-1-Globulin, U: 9.3 %
Alpha-2-Globulin, U: 25.5 %
Beta Globulin, U: 29.5 %
Gamma Globulin, U: 22.5 %
Total Protein, Urine: 15.7 mg/dL

## 2022-03-08 LAB — PROTEIN ELECTROPHORESIS, SERUM
A/G Ratio: 1.2 (ref 0.7–1.7)
Albumin ELP: 3.1 g/dL (ref 2.9–4.4)
Alpha-1-Globulin: 0.3 g/dL (ref 0.0–0.4)
Alpha-2-Globulin: 0.8 g/dL (ref 0.4–1.0)
Beta Globulin: 0.7 g/dL (ref 0.7–1.3)
Gamma Globulin: 0.8 g/dL (ref 0.4–1.8)
Globulin, Total: 2.5 g/dL (ref 2.2–3.9)
Total Protein ELP: 5.6 g/dL — ABNORMAL LOW (ref 6.0–8.5)

## 2022-03-08 LAB — C-REACTIVE PROTEIN: CRP: 13.4 mg/dL — ABNORMAL HIGH (ref <1.0)

## 2022-03-08 LAB — MAGNESIUM: Magnesium: 2.2 mg/dL (ref 1.7–2.4)

## 2022-03-08 LAB — PHOSPHORUS: Phosphorus: 5.1 mg/dL — ABNORMAL HIGH (ref 2.5–4.6)

## 2022-03-08 MED ORDER — OXYCODONE HCL 5 MG PO TABS
5.0000 mg | ORAL_TABLET | Freq: Once | ORAL | Status: AC
  Administered 2022-03-08: 5 mg via ORAL
  Filled 2022-03-08: qty 1

## 2022-03-08 MED ORDER — ONDANSETRON HCL 4 MG/2ML IJ SOLN: 4.0000 mg | Freq: Four times a day (QID) | INTRAMUSCULAR | Status: DC | PRN

## 2022-03-08 MED ORDER — ONDANSETRON HCL 4 MG PO TABS: 4.0000 mg | ORAL_TABLET | Freq: Four times a day (QID) | ORAL | Status: AC | PRN

## 2022-03-08 NOTE — Plan of Care (Signed)

## 2022-03-08 NOTE — Progress Notes (Signed)
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  Noted to have hyperkalemia and AKI and urinary tract infection.  Nephrology consulted workup underway as well as discussion to start dialysis.  Vascular surgery saw patient and placed temporary dialysis catheter on 1/27.  After several sessions of hemodialysis, plan is for renal biopsy which unable to do until 2/5 due to patient being on aspirin.  Patient's urine output has improved in the last 24 hours and dialysis being held in hopes for improvement.   Assessment and Plan: * Hyperkalemia-resolved Secondary to worsening renal failure.  Status post Lokelma.  Metabolic acidosis Secondary to uremia  AKI (acute kidney injury) ([place]) in the setting of stage IV chronic kidney disease - Requiring dialysis.  Workup in progress.  Waiting for renal biopsy after 5 days of aspirin clearance.  In the meantime, has been on dialysis although urine output improving.  UTI - Completed antibiotic course  HIV (human immunodeficiency virus infection) [facility]) - May need to change medication for Biktarvy if creatinine clearance does not improve.  Essential hypertension Home medications resumed with additional medications added.  Blood pressure still elevated  Tobacco dependence - As needed nicotine patch ordered  Hyponatremia - Secondary to hypervolemia.  Sodium now normalized  Weakness - Multifactorial  Seizure disorder (HCC) - Ativan 2 mg IV as needed for seizure, 2 doses ordered  Frequent falls - Fall precaution  Sleep apnea - CPAP nightly ordered  Insulin  dependent type 2 diabetes mellitus (HCC) - Insulin SSI with at bedtime coverage ordered - Long-term insulin resumed, blood sugar staying less than 200       Body mass index is 23.84 kg/m.        Consultants: Vascular surgery Interventional radiology Nephrology  Procedures: Placement of dialysis catheter  Antimicrobials: Completed course of Rocephin/Augmentin for UTI  Code Status: Full code   Subjective: Complains of left knee pain  Objective: Vital signs were reviewed and unremarkable. Vitals:   03/08/22 1140 03/08/22 1537  BP: (!) 136/101 (!) 157/116  Pulse: 100 99  Resp: 18 18  Temp: 98 F (36.7 C) 97.9 F (36.6 C)  SpO2: 100% 100%    Intake/Output Summary (Last 24 hours) at 03/08/2022 1728 Last data filed at 03/08/2022 1537 Gross per 24 hour  Intake 840 ml  Output 2650 ml  Net -1810 ml   Filed Weights   03/06/22 1627 03/07/22 0902 03/08/22 0756  Weight: 75 kg 70.7 kg 67 kg   Body mass index is 23.84 kg/m.  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 Between 7PM-7AM, please contact night-coverage If you still have difficulty reaching the attending provider, please page the [facility] (Director on Call) for [facility] on amion for assistance.

## 2022-03-08 NOTE — Progress Notes (Signed)
[facility]  ROUNDING NOTE   Subjective:   Urine output appears to be improving and currently 4.3 L over the preceding 24 hours. Patient's wife updated over the phone this a.m.   Objective:  Vital signs in last 24 hours:  Temp:  [97.6 F (36.4 C)-98.6 F (37 C)] 97.6 F (36.4 C) (01/31 0756) Pulse Rate:  [98-115] 102 (01/31 0756) Resp:  [16-18] 18 (01/31 0756) BP: (132-161)/(84-115) 154/113 (01/31 0756) SpO2:  [98 %-100 %] 98 % (01/31 0756) FiO2 (%):  [21 %] 21 % (01/30 2242) Weight:  [67 kg] 67 kg (01/31 0756)  Weight change: -4.2 kg Filed Weights   03/06/22 1627 03/07/22 0902 03/08/22 0756  Weight: 75 kg 70.7 kg 67 kg    Intake/Output: I/O last 3 completed shifts: In: 72 [P.O.:720] Out: 5975 [Urine:5975]   Intake/Output this shift:  Total I/O In: 480 [P.O.:480] Out: 0   Physical Exam: General: NAD  Head: Normocephalic, atraumatic. Moist oral mucosal membranes  Eyes: Anicteric,  Lungs:  Clear to auscultation, normal effort  Heart: Regular rate and rhythm  Abdomen:  Soft, nontender  Extremities: No lower extremity edema  Neurologic: Alert and oriented unable to answer simple questions.   Skin: No lesions  Access: Right IJ nontunneled catheter.    Basic Metabolic Panel: Recent Labs  Lab 03/04/22 0506 03/05/22 0602 03/06/22 0449 03/07/22 0423 03/08/22 0207  NA 129* 134* 139 137 136  K 5.0 4.5 4.5 3.6 3.7  CL 95* 101 103 103 101  CO2 17* 21* 20* 23 22  GLUCOSE 87 96 77 96 145*  BUN 78* 51* 56* 32* 40*  CREATININE 7.28* 6.29* 7.31* 5.56* 6.66*  CALCIUM 8.1* 7.8* 7.8* 7.9* 8.7*  MG 2.8* 2.4 2.4 2.2 2.2  PHOS 7.6* 7.1* 6.8* 4.2 5.1*     Liver Function Tests: Recent Labs  Lab 03/04/22 0506 03/05/22 0602 03/06/22 0449 03/07/22 0423 03/08/22 0207  AST 34 52* 52* 51* 44*  ALT 13 17 16 19 21   ALKPHOS 61 69 67 76 84  BILITOT 0.8 0.8 0.6 0.8 1.2  PROT 5.8* 5.9* 5.9* 6.4* 7.1  ALBUMIN 2.9* 2.8* 2.8* 2.9* 3.2*    No results for  input(s): "LIPASE", "AMYLASE" in the last 168 hours. No results for input(s): "AMMONIA" in the last 168 hours.  CBC: Recent Labs  Lab 03/02/22 1208 03/03/22 0406 03/04/22 0506 03/05/22 0602 03/06/22 0449 03/07/22 0423 03/08/22 0207  WBC 7.4   < > 5.1 4.3 4.5 4.5 5.4  NEUTROABS 5.2  --   --   --   --   --   --   HGB 14.9   < > 12.2* 12.5* 11.9* 12.5* 13.3  HCT 44.2   < > 35.9* 36.9* 35.2* 36.9* 39.7  MCV 81.1   < > 80.5 79.2* 79.8* 79.7* 79.7*  PLT 261   < > 185 179 194 196 228   < > = values in this interval not displayed.     Cardiac Enzymes: No results for input(s): "CKTOTAL", "CKMB", "CKMBINDEX", "TROPONINI" in the last 168 hours.  BNP: Invalid input(s): "POCBNP"  CBG: Recent Labs  Lab 03/07/22 0921 03/07/22 1117 03/07/22 1657 03/07/22 1957 03/08/22 0752  GLUCAP 94 103* 169* 164* 165*     Microbiology: Results for orders placed or performed during the hospital encounter of 03/02/22  Culture, blood (Routine X 2) w Reflex to ID Panel     Status: None   Collection Time: 03/02/22  2:15 PM   Specimen:  BLOOD  Result Value Ref Range Status   Specimen Description BLOOD LEFT ANTECUBITAL  Final   Special Requests   Final    BOTTLES DRAWN AEROBIC AND ANAEROBIC Blood Culture adequate volume   Culture   Final    NO GROWTH 5 DAYS Performed at [facility], [address] [place], [place] [address]    Report Status 03/07/2022 FINAL  Final  Culture, blood (Routine X 2) w Reflex to ID Panel     Status: None   Collection Time: 03/02/22  2:20 PM   Specimen: BLOOD  Result Value Ref Range Status   Specimen Description BLOOD RIGHT ANTECUBITAL  Final   Special Requests   Final    BOTTLES DRAWN AEROBIC AND ANAEROBIC Blood Culture adequate volume   Culture   Final    NO GROWTH 5 DAYS Performed at [facility], [address], [place], [place] [address]    Report Status 03/07/2022 FINAL  Final  Urine Culture (for pregnant, neutropenic or urologic  patients or patients with an indwelling urinary catheter)     Status: Abnormal   Collection Time: 03/02/22  3:53 PM   Specimen: Urine, Clean Catch  Result Value Ref Range Status   Specimen Description   Final    URINE, CLEAN CATCH Performed at [facility], [address], [place], [place] [address]    Special Requests   Final    NONE Performed at [facility], [address] [place], [place] [address]    Culture (A)  Final    >=100,000 COLONIES/mL ENTEROCOCCUS FAECALIS >=100,000 COLONIES/mL LACTOBACILLUS SPECIES Standardized susceptibility testing for this organism is not available. Performed at [facility], [place] [address] [place], [place] [address]    Report Status 03/05/2022 FINAL  Final   Organism ID, Bacteria ENTEROCOCCUS FAECALIS (A)  Final      Susceptibility   Enterococcus faecalis - MIC*    AMPICILLIN <=2 SENSITIVE Sensitive     NITROFURANTOIN <=16 SENSITIVE Sensitive     VANCOMYCIN 1 SENSITIVE Sensitive     * >=100,000 COLONIES/mL ENTEROCOCCUS FAECALIS  MRSA Next Gen by PCR, Nasal     Status: None   Collection Time: 03/03/22  6:05 AM   Specimen: Nasal Mucosa; Nasal Swab  Result Value Ref Range Status   MRSA by PCR Next Gen NOT DETECTED NOT DETECTED Final    Comment: (NOTE) The GeneXpert MRSA Assay (FDA approved for NASAL specimens only), is one component of a comprehensive MRSA colonization surveillance program. It is not intended to diagnose MRSA infection nor to guide or monitor treatment for MRSA infections. Test performance is not FDA approved in patients less than 23 years old. Performed at [facility], [address] [place], [place] [address]     Coagulation Studies: Recent Labs    03/06/22 1726  LABPROT 15.3*  INR 1.2     Urinalysis: No results for input(s): "COLORURINE", "LABSPEC", "PHURINE", "GLUCOSEU", "HGBUR", "BILIRUBINUR", "KETONESUR", "PROTEINUR", "UROBILINOGEN", "NITRITE", "LEUKOCYTESUR" in the last  72 hours.  Invalid input(s): "APPERANCEUR"     Imaging: No results found.   Medications:    anticoagulant sodium citrate      acyclovir  200 mg Oral BID   amLODipine  10 mg Oral QPM   bictegravir-emtricitabine-tenofovir AF  1 tablet Oral Daily   Chlorhexidine Gluconate Cloth  6 each Topical Q0600   docusate sodium  100 mg Oral BID   heparin  5,000 Units Subcutaneous Q8H   hydrALAZINE  50  mg Oral TID   insulin aspart  0-6 Units Subcutaneous TID WC   isosorbide dinitrate  20 mg Oral TID   lidocaine  1 patch Transdermal Q24H   polyethylene glycol  17 g Oral BID   rosuvastatin  10 mg Oral QHS   sodium bicarbonate  1,300 mg Oral BID   albuterol, alteplase, anticoagulant sodium citrate, cyclobenzaprine, heparin, hydrALAZINE, lidocaine (PF), lidocaine-prilocaine, LORazepam, melatonin, nicotine, nitroGLYCERIN, ondansetron **OR** ondansetron (ZOFRAN) IV, pentafluoroprop-tetrafluoroeth, senna-docusate  Assessment/ Plan:  [name] is a 42 y.o.  male with past medical conditions including depression, HIV, hyperlipidemia, hypertension, BPH, diabetes who was admitted to [facility] on 03/02/2022 for Hyperkalemia [E87.5] Hypoglycemia [E16.2] Acute renal failure, unspecified acute renal failure type ([place]) [N17.9]   Acute kidney injury with proteinuria/hyperkalemia/hyponatremia.  Creatinine 5.05 on ED admission.  AKI appears multifactorial due to recent UTI infection, hypovolemia, and NSAID use.  NSAID use can cause glomular disease and FSGS, resulting in proteinuria.  Other differential includes HIV-associated nephropathy.  Patient also has underlying insulin-dependent diabetes.  Hemoglobin A1c 7.8% from 09/09/2021.   UPC ratio 3.95  on 03/04/2022.  Renal ultrasound negative for obstruction. Serologic workup complements-C3-C4 normal.  ANA negative,  anca, NEG. kappa /Lambda ration mildly elevated.   Baseline creatinine of 0.78 on November 28, 2021. Creatinine a bit higher today at 6.66.  Good  urine output at 4.3 L over the preceding 24 hours.  Hold off on dialysis today.  We would like to perform renal biopsy however we will need to hold off until patient off of aspirin for total of at least 5 days.   Lab Results  Component Value Date   CREATININE 6.66 (H) 03/08/2022   CREATININE 5.56 (H) 03/07/2022   CREATININE 7.31 (H) 03/06/2022    Intake/Output Summary (Last 24 hours) at 03/08/2022 1103 Last data filed at 03/08/2022 1022 Gross per 24 hour  Intake 960 ml  Output 3250 ml  Net -2290 ml    2. Hypertension, essential. Home regimen includes amlodipine, clonidine, isosorbide, lisinopril and metoprolol. Blood pressure bit high this a.m.  Continue amlodipine, hydralazine, isosorbide dinitrate.  w.    LOS: 6 Alec Jaros 1/31/202411:03 AM

## 2022-03-09 DIAGNOSIS — G40909 Epilepsy, unspecified, not intractable, without status epilepticus: Secondary | ICD-10-CM | POA: Diagnosis not present

## 2022-03-09 DIAGNOSIS — E119 Type 2 diabetes mellitus without complications: Secondary | ICD-10-CM | POA: Diagnosis not present

## 2022-03-09 DIAGNOSIS — Z794 Long term (current) use of insulin: Secondary | ICD-10-CM

## 2022-03-09 DIAGNOSIS — F17290 Nicotine dependence, other tobacco product, uncomplicated: Secondary | ICD-10-CM

## 2022-03-09 DIAGNOSIS — N179 Acute kidney failure, unspecified: Secondary | ICD-10-CM | POA: Diagnosis not present

## 2022-03-09 DIAGNOSIS — B20 Human immunodeficiency virus [HIV] disease: Secondary | ICD-10-CM | POA: Diagnosis not present

## 2022-03-09 LAB — BASIC METABOLIC PANEL
Anion gap: 12 (ref 5–15)
BUN: 53 mg/dL — ABNORMAL HIGH (ref 6–20)
CO2: 22 mmol/L (ref 22–32)
Calcium: 8.9 mg/dL (ref 8.9–10.3)
Chloride: 101 mmol/L (ref 98–111)
Creatinine, Ser: 6.41 mg/dL — ABNORMAL HIGH (ref 0.61–1.24)
GFR, Estimated: 10 mL/min — ABNORMAL LOW (ref >60)
Glucose, Bld: 193 mg/dL — ABNORMAL HIGH (ref 70–99)
Potassium: 3.3 mmol/L — ABNORMAL LOW (ref 3.5–5.1)
Sodium: 135 mmol/L (ref 135–145)

## 2022-03-09 LAB — CBC
HCT: 39.4 % (ref 39.0–52.0)
Hemoglobin: 13.3 g/dL (ref 13.0–17.0)
MCH: 26.7 pg (ref 26.0–34.0)
MCHC: 33.8 g/dL (ref 30.0–36.0)
MCV: 79.1 fL — ABNORMAL LOW (ref 80.0–100.0)
Platelets: 239 10*3/uL (ref 150–400)
RBC: 4.98 MIL/uL (ref 4.22–5.81)
RDW: 15.2 % (ref 11.5–15.5)
WBC: 5.5 10*3/uL (ref 4.0–10.5)
nRBC: 0 % (ref 0.0–0.2)

## 2022-03-09 LAB — GLUCOSE, CAPILLARY
Glucose-Capillary: 154 mg/dL — ABNORMAL HIGH (ref 70–99)
Glucose-Capillary: 305 mg/dL — ABNORMAL HIGH (ref 70–99)

## 2022-03-09 LAB — HEMOGLOBIN A1C
Hgb A1c MFr Bld: 8.2 % — ABNORMAL HIGH (ref 4.8–5.6)
Mean Plasma Glucose: 188.64 mg/dL

## 2022-03-09 MED ORDER — FENTANYL CITRATE PF 50 MCG/ML IJ SOSY: 12.5000 ug | PREFILLED_SYRINGE | Freq: Once | INTRAMUSCULAR | Status: DC | PRN

## 2022-03-09 MED ORDER — DIPHENHYDRAMINE HCL 50 MG/ML IJ SOLN: 50.0000 mg | Freq: Once | INTRAMUSCULAR | Status: DC | PRN

## 2022-03-09 MED ORDER — SODIUM CHLORIDE 0.9 % IV SOLN: INTRAVENOUS | Status: DC

## 2022-03-09 MED ORDER — ONDANSETRON HCL 4 MG/2ML IJ SOLN
4.0000 mg | Freq: Four times a day (QID) | INTRAMUSCULAR | Status: DC | PRN
  Administered 2022-03-10: 4 mg via INTRAVENOUS
  Filled 2022-03-09: qty 2

## 2022-03-09 MED ORDER — INSULIN ASPART 100 UNIT/ML IJ SOLN
0.0000 [IU] | Freq: Every day | INTRAMUSCULAR | Status: DC
  Administered 2022-03-10: 4 [IU] via SUBCUTANEOUS
  Administered 2022-03-14: 5 [IU] via SUBCUTANEOUS
  Administered 2022-03-15: 3 [IU] via SUBCUTANEOUS
  Filled 2022-03-09 (×3): qty 1

## 2022-03-09 MED ORDER — CIPROFLOXACIN IN D5W 400 MG/200ML IV SOLN
400.0000 mg | INTRAVENOUS | Status: DC
  Filled 2022-03-09: qty 200

## 2022-03-09 MED ORDER — MIDAZOLAM HCL 2 MG/ML PO SYRP
8.0000 mg | ORAL_SOLUTION | Freq: Once | ORAL | Status: DC | PRN
  Filled 2022-03-09: qty 5

## 2022-03-09 MED ORDER — BUPRENORPHINE 7.5 MCG/HR TD PTWK
1.0000 | MEDICATED_PATCH | TRANSDERMAL | Status: DC
  Administered 2022-03-09: 1 via TRANSDERMAL
  Filled 2022-03-09: qty 1

## 2022-03-09 MED ORDER — HYDROMORPHONE HCL 1 MG/ML IJ SOLN
1.0000 mg | Freq: Once | INTRAMUSCULAR | Status: DC | PRN
  Filled 2022-03-09: qty 1

## 2022-03-09 MED ORDER — INSULIN ASPART 100 UNIT/ML IJ SOLN
0.0000 [IU] | Freq: Three times a day (TID) | INTRAMUSCULAR | Status: DC
  Administered 2022-03-10: 3 [IU] via SUBCUTANEOUS
  Administered 2022-03-10: 2 [IU] via SUBCUTANEOUS
  Administered 2022-03-11: 7 [IU] via SUBCUTANEOUS
  Administered 2022-03-11: 3 [IU] via SUBCUTANEOUS
  Administered 2022-03-12: 9 [IU] via SUBCUTANEOUS
  Administered 2022-03-12 (×2): 2 [IU] via SUBCUTANEOUS
  Administered 2022-03-13: 3 [IU] via SUBCUTANEOUS
  Administered 2022-03-14: 2 [IU] via SUBCUTANEOUS
  Administered 2022-03-14: 5 [IU] via SUBCUTANEOUS
  Administered 2022-03-14: 2 [IU] via SUBCUTANEOUS
  Administered 2022-03-15 – 2022-03-16 (×4): 3 [IU] via SUBCUTANEOUS
  Administered 2022-03-16: 5 [IU] via SUBCUTANEOUS
  Filled 2022-03-09 (×16): qty 1

## 2022-03-09 MED ORDER — METOPROLOL TARTRATE 50 MG PO TABS
50.0000 mg | ORAL_TABLET | Freq: Two times a day (BID) | ORAL | Status: DC
  Administered 2022-03-09 – 2022-03-11 (×6): 50 mg via ORAL
  Filled 2022-03-09 (×6): qty 1

## 2022-03-09 MED ORDER — METHYLPREDNISOLONE SODIUM SUCC 125 MG IJ SOLR
125.0000 mg | Freq: Once | INTRAMUSCULAR | Status: DC | PRN
  Filled 2022-03-09: qty 2

## 2022-03-09 MED ORDER — FAMOTIDINE 20 MG PO TABS
40.0000 mg | ORAL_TABLET | Freq: Once | ORAL | Status: DC | PRN
  Filled 2022-03-09: qty 1

## 2022-03-09 NOTE — Progress Notes (Signed)
Tolerated tx well,  pt ran for 2 hrs with no fluid removal   03/09/22 1828  Vitals  Temp 98.6 F (37 C)  Temp Source Oral  BP (!) 138/122  MAP (mmHg) 128  BP Location Right Arm  BP Method Automatic  Patient Position (if appropriate) Lying  Pulse Rate (!) 108  Pulse Rate Source Monitor  ECG Heart Rate (!) 110  Resp 13  Oxygen Therapy  SpO2 100 %  O2 Device Room Air  Patient Activity (if Appropriate) In bed  Pulse Oximetry Type Continuous  During Treatment Monitoring  HD Safety Checks Performed Yes  Intra-Hemodialysis Comments Tx completed;Tolerated well  Post Treatment  Dialyzer Clearance Lightly streaked  Duration of HD Treatment -hour(s) 2 hour(s)  Hemodialysis Intake (mL) 0 mL  Liters Processed 48  Fluid Removed (mL) 0 mL  Tolerated HD Treatment Yes  Post-Hemodialysis Comments HD completed. Tolerated well. No complications  Note  Observations Tx ended  Hemodialysis Catheter Right Internal jugular Double lumen Temporary (Non-Tunneled)  Placement Date/Time: 03/04/22 1400   Time Out: Correct patient;Correct site;Correct procedure  Maximum sterile barrier precautions: Hand hygiene;Cap;Mask;Sterile gown;Sterile gloves;Large sterile sheet  Site Prep: Chlorhexidine (preferred)  Local Anes...  Site Condition No complications  Blue Lumen Status Heparin locked  Red Lumen Status Heparin locked  Purple Lumen Status N/A  Catheter fill solution Heparin 1000 units/ml  Catheter fill volume (Arterial) 1.4 cc  Catheter fill volume (Venous) 1.4  Dressing Type Transparent  Dressing Status Antimicrobial disc in place;Clean, Dry, Intact  Drainage Description None  Post treatment catheter status Capped and Clamped

## 2022-03-09 NOTE — Progress Notes (Signed)
03/04/22 1649 03/04/22 1700 03/04/22 1730  Vitals  Temp  --   --   --   Temp Source  --   --   --   BP (!) 152/111 (!) 152/108 (!) 141/131  MAP (mmHg) 123 121 128  BP Location Right Arm Right Arm Right Arm  BP Method Automatic Automatic Automatic  Patient Position (if appropriate) Lying Lying Lying  Pulse Rate (!) 105 100 100  Pulse Rate Source Monitor Monitor Monitor  ECG Heart Rate (!) 105 100 100  Resp 14 14 16   During Treatment Monitoring  Blood Flow Rate (mL/min) 200 mL/min 200 mL/min 200 mL/min  Arterial Pressure (mmHg) -60 mmHg -70 mmHg -70 mmHg  Venous Pressure (mmHg) 90 mmHg 100 mmHg 90 mmHg  TMP (mmHg) 9 mmHg 10 mmHg 10 mmHg  Ultrafiltration Rate (mL/min) 300 mL/min 300 mL/min 300 mL/min  Dialysate Flow Rate (mL/min) 200 ml/min 200 ml/min 200 ml/min  HD Safety Checks Performed Yes Yes Yes  Intra-Hemodialysis Comments Tx initiated Progressing as prescribed (Pt resting, stable, cont to monitor, access visible, safety maintained) Progressing as prescribed (Pt alert, no c/o, safety maintained, access visible, cont to monitor)  Dialysis Fluid Bolus  --   --   --     03/04/22 1800 03/04/22 1830 03/04/22 1852  Vitals  Temp  --   --  97.8 F (36.6 C)  Temp Source  --   --  Oral  BP (!) 146/113 (!) 153/110 (!) 160/111  MAP (mmHg) 123 124 127  BP Location Right Arm Right Arm Right Arm  BP Method Automatic Automatic Automatic  Patient Position (if appropriate) Lying Lying Lying  Pulse Rate 97 93 98  Pulse Rate Source Monitor Monitor Monitor  ECG Heart Rate 96 93 98  Resp 12 13 16   During Treatment Monitoring  Blood Flow Rate (mL/min) 200 mL/min 200 mL/min  --   Arterial Pressure (mmHg) -70 mmHg -60 mmHg  --   Venous Pressure (mmHg) 100 mmHg 90 mmHg  --   TMP (mmHg) 9 mmHg 9 mmHg  --   Ultrafiltration Rate (mL/min) 300 mL/min 300 mL/min  --   Dialysate Flow Rate (mL/min) 200 ml/min 200 ml/min  --   HD Safety Checks Performed Yes Yes Yes  Intra-Hemodialysis Comments  Progressing as prescribed (Pt alert, no c/o, cont to monitor, safety maintained, access visible) Progressing as prescribed (Pt alert, no c/o, cont to monitor, access visible,safety maintained) Tolerated well;Tx completed  Dialysis Fluid Bolus  --   --   --     03/04/22 1900 03/06/22 1338 03/06/22 1400  Vitals  Temp  --   --   --   Temp Source  --   --   --   BP (!) 158/111 (!) 151/98 (!) 162/112  MAP (mmHg) 126 114 127  BP Location Right Arm Right Arm Right Arm  BP Method Automatic Automatic Automatic  Patient Position (if appropriate) Lying Lying Lying  Pulse Rate 99 89 89  Pulse Rate Source Monitor Monitor Monitor  ECG Heart Rate 98 89 90  Resp 12 13 11   During Treatment Monitoring  Blood Flow Rate (mL/min)  --  250 mL/min 250 mL/min  Arterial Pressure (mmHg)  --  -70 mmHg -70 mmHg  Venous Pressure (mmHg)  --  120 mmHg 120 mmHg  TMP (mmHg)  --  10 mmHg 10 mmHg  Ultrafiltration Rate (mL/min)  --  280 mL/min 280 mL/min  Dialysate Flow Rate (mL/min)  --  300 ml/min  300 ml/min  HD Safety Checks Performed  --  Yes Yes  Intra-Hemodialysis Comments  --  Tx initiated Progressing as prescribed (Pt alert, watching tv, no c/o, access visible, safety maintained, cont to monitor, ufr -[id number])  Dialysis Fluid Bolus  --  Normal Saline  --     03/06/22 1430 03/06/22 1500 03/06/22 1530  Vitals  Temp  --   --   --   Temp Source  --   --   --   BP (!) 161/107 (!) 151/115 (!) 168/113  MAP (mmHg) 123 124 131  BP Location Right Arm Right Arm Right Arm  BP Method Automatic Automatic Automatic  Patient Position (if appropriate) Lying Lying Lying  Pulse Rate 79 89 93  Pulse Rate Source Monitor Monitor Monitor  ECG Heart Rate 85 91 93  Resp 11 10 18   During Treatment Monitoring  Blood Flow Rate (mL/min) 250 mL/min 250 mL/min 250 mL/min  Arterial Pressure (mmHg) -80 mmHg -90 mmHg -70 mmHg  Venous Pressure (mmHg) 120 mmHg 130 mmHg 70 mmHg  TMP (mmHg) 10 mmHg 9 mmHg 8 mmHg  Ultrafiltration Rate  (mL/min) 280 mL/min 280 mL/min 280 mL/min  Dialysate Flow Rate (mL/min) 300 ml/min 300 ml/min 300 ml/min  HD Safety Checks Performed Yes Yes Yes  Intra-Hemodialysis Comments Progressing as prescribed;Tolerated well Progressing as prescribed Progressing as prescribed (Pt alert, no c/o, stable, cont to monitor, safety maintained, access visible)  Dialysis Fluid Bolus  --   --   --     03/06/22 1600 03/06/22 1616 03/06/22 1624  Vitals  Temp  --  98 F (36.7 C)  --   Temp Source  --  Oral  --   BP (!) 179/114 (!) 175/115 (!) 182/121  MAP (mmHg) 133 134 139  BP Location Right Arm Right Arm Right Arm  BP Method Automatic Automatic Automatic  Patient Position (if appropriate) Lying Lying Lying  Pulse Rate 83 89 97  Pulse Rate Source Monitor Monitor Monitor  ECG Heart Rate 83 81 94  Resp 14 13 13   During Treatment Monitoring  Blood Flow Rate (mL/min) 250 mL/min  --   --   Arterial Pressure (mmHg) -70 mmHg  --   --   Venous Pressure (mmHg) 70 mmHg  --   --   TMP (mmHg) 7 mmHg  --   --   Ultrafiltration Rate (mL/min) 280 mL/min  --   --   Dialysate Flow Rate (mL/min) 300 ml/min  --   --   HD Safety Checks Performed Yes Yes  --   Intra-Hemodialysis Comments Progressing as prescribed Tolerated well;Tx completed  --   Dialysis Fluid Bolus  --   --   --

## 2022-03-09 NOTE — Plan of Care (Signed)

## 2022-03-09 NOTE — H&P (View-Only) (Signed)
Hospital Consult    Reason for Consult:  Dialysis Perma Catheter Placement  Requesting Physician:  Dorothyann Gibbs NP MRN #:  [id number]  History of Present Illness: This is a 42 y.o. 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 Noted to have hyperkalemia and AKI and urinary tract infection. Vascular surgery saw patient and placed temporary dialysis catheter on 1/27. After several sessions of hemodialysis, plan is for renal biopsy which unable to do until 2/5 due to patient being on aspirin.   Vascular Surgery now consulted to place long term dialysis Perma Catheter for continued dialysis     Past Medical History:  Diagnosis Date   (HFpEF) heart failure with preserved ejection fraction (HCC)    Acid reflux    Adopted    Anxiety    Arthritis    Arthrogryposis    Asthma    BPH (benign prostatic hyperplasia)    Bronchitis    Chronic pain syndrome    Depressive disorder, not elsewhere classified    Diabetes mellitus without complication ([place])    Diverticulitis of colon (without mention of hemorrhage)(562.11)    ED (erectile dysfunction)    Herpes genitalis    HIV infection ([place])    HTN (hypertension)    Hyperlipidemia    Hypertensive cardiomyopathy ([place])    Hypogonadism in male    OAB (overactive bladder)    Other psoriasis    Seizure disorder (HCC)    Sleep apnea    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 or other cause, without mention of thyrotoxic crisis or storm    hyperthyroidism    Past Surgical History:  Procedure Laterality Date   APPENDECTOMY     COLOSTOMY     CORNEAL TRANSPLANT     feet surgery     both   HAND SURGERY     left and right   leg surgery     left and right   ORTHOPEDIC SURGERY     hands, feet, knees, legs   tubes in ears     both    Allergies  Allergen Reactions    Penicillins Hives, Itching and Rash    Has patient had a PCN reaction causing immediate rash, facial/tongue/throat swelling, SOB or lightheadedness with hypotension: Yes Has patient had a PCN reaction causing severe rash involving mucus membranes or skin necrosis: No Has patient had a PCN reaction that required hospitalization No Has patient had a PCN reaction occurring within the last 10 years: No If all of the above answers are "NO", then may proceed with Cephalosporin use.  Has tolerated amoxicillin without problems   Erythromycin Base Itching and Rash    Prior to Admission medications   Medication Sig Start Date End Date Taking? Authorizing Provider  acyclovir (ZOVIRAX) 400 MG tablet Take 1 tablet (400 mg total) by mouth 3 (three) times daily. 02/12/22 03/14/22 Yes White, Adrienne R, NP  albuterol (PROVENTIL HFA;VENTOLIN HFA) 108 (90 BASE) MCG/ACT inhaler Inhale 2 puffs into the lungs every 4 (four) hours as needed for wheezing or shortness of breath.    Yes [provider]  albuterol (PROVENTIL) (2.5 MG/3ML) 0.083% nebulizer solution Take 2.5 mg by nebulization every 4 (four) hours as needed for wheezing or shortness of breath.   Yes [provider]  amLODipine (NORVASC) 10 MG tablet Take 10 mg by mouth daily.   Yes  [provider]  aspirin EC 81 MG tablet Take 81 mg by mouth daily. Swallow whole.   Yes [provider]  BIKTARVY 50-200-25 MG TABS tablet Take 1 tablet by mouth daily. 11/21/16  Yes [provider]  buprenorphine (BUTRANS) 7.5 MCG/HR Place 7.5 mg onto the skin once a week. Saturday   Yes [provider]  buPROPion (WELLBUTRIN XL) 150 MG 24 hr tablet Take 150 mg by mouth daily. 08/11/21  Yes [provider]  cetirizine (ZYRTEC) 10 MG tablet Take 10 mg by mouth daily.   Yes [provider]  chlorthalidone (HYGROTON) 25 MG tablet Take 25 mg by mouth daily.   Yes [provider]  citalopram (CELEXA) 20  MG tablet Take 20 mg by mouth daily.   Yes [provider]  cloNIDine (CATAPRES - DOSED IN MG/24 HR) 0.3 mg/24hr patch 0.3 mg once a week. 06/13/21  Yes [provider]  diclofenac sodium (VOLTAREN) 1 % GEL Apply 2-4 g topically 4 (four) times daily as needed. For pain.   Yes [provider]  dicyclomine (BENTYL) 10 MG capsule Take 20 mg by mouth 2 (two) times daily before a meal. 05/01/21  Yes [provider]  empagliflozin (JARDIANCE) 25 MG TABS tablet Take 25 mg by mouth daily. 01/19/20  Yes [provider]  famotidine (PEPCID) 20 MG tablet Take 20 mg by mouth daily.  01/03/16  Yes [provider]  ferrous sulfate 325 (65 FE) MG tablet Take 325 mg by mouth daily with breakfast.   Yes [provider]  fluticasone (FLONASE) 50 MCG/ACT nasal spray Place 2 sprays into both nostrils daily.   Yes [provider]  fluticasone (FLOVENT HFA) 110 MCG/ACT inhaler Inhale 2 puffs into the lungs 2 (two) times daily.   Yes [provider]  gabapentin (NEURONTIN) 300 MG capsule Take 1 capsule (300 mg total) by mouth 2 (two) times daily. Patient taking differently: Take 300 mg by mouth 3 (three) times daily. 09/12/21 03/02/22 Yes Debbe Odea, MD  glucose 4 GM chewable tablet Chew 1 tablet by mouth as needed for low blood sugar.   Yes [provider]  insulin degludec (TRESIBA) 100 UNIT/ML FlexTouch Pen Inject 100 Units into the skin 2 (two) times daily.   Yes [provider]  ipratropium (ATROVENT) 0.06 % nasal spray Place 2 sprays into both nostrils 4 (four) times daily. 08/29/21  Yes Margarette Canada, NP  isosorbide mononitrate (IMDUR) 60 MG 24 hr tablet Take 60 mg by mouth daily. 07/01/21  Yes [provider]  ketoconazole (NIZORAL) 2 % shampoo Apply 1 application topically 2 (two) times a week. tues and thursday 08/27/14  Yes [provider]  Melatonin 5 MG TABS Take 10 mg by mouth at bedtime as needed  (sleep).   Yes [provider]  metFORMIN (GLUCOPHAGE-XR) 500 MG 24 hr tablet Take 1,000 mg by mouth 2 (two) times daily.   Yes [provider]  metoprolol tartrate (LOPRESSOR) 100 MG tablet TAKE 1 AND 1/2 TABLETS(150 MG) BY MOUTH TWICE DAILY 10/31/21  Yes Gollan, Kathlene November, MD  mirtazapine (REMERON) 45 MG tablet Take 45 mg by mouth at bedtime.   Yes [provider]  Multiple Vitamin (MULTIVITAMIN) tablet Take 1 tablet by mouth daily.   Yes [provider]  nitroGLYCERIN (NITROSTAT) 0.4 MG SL tablet PLACE 1 TABLET SUBLINGUALLY EVERY 5 MINUTES AS NEEDED FOR CHEST PAIN. CALL 911 IF YOU HAVE TAKEN 3 NITRO FOR CHEST PAIN 09/21/21  Yes Antonieta Iba, MD  nortriptyline (PAMELOR) 25 MG capsule Take 50 mg by mouth at bedtime.   Yes [provider]  omega-3 acid ethyl esters (LOVAZA) 1 g capsule Take 1 g by mouth 3 (three) times daily.   Yes [provider]  omeprazole (PRILOSEC) 20 MG capsule Take 20 mg by mouth daily. 09/08/21  Yes [provider]  ondansetron (ZOFRAN-ODT) 4 MG disintegrating tablet Take 4 mg by mouth every 8 (eight) hours as needed for nausea or vomiting.   Yes [provider]  polyethylene glycol (MIRALAX / GLYCOLAX) 17 g packet Take 17 g by mouth 3 (three) times daily.   Yes [provider]  rosuvastatin (CRESTOR) 40 MG tablet TAKE 1 TABLET(40 MG) BY MOUTH DAILY 04/30/19  Yes Gollan, Tollie Pizza, MD  solifenacin (VESICARE) 10 MG tablet Take 10 mg by mouth daily.   Yes [provider]  spironolactone (ALDACTONE) 50 MG tablet Take 100 mg by mouth daily. 06/15/21  Yes [provider]  tiZANidine (ZANAFLEX) 4 MG tablet Take 1 tablet (4 mg total) by mouth 3 (three) times daily. 07/06/21 07/06/22 Yes Varney Daily, PA  TRULICITY 4.5 MG/0.5ML SOPN SMARTSIG:4.5 Milligram(s) SUB-Q Once a Week 09/23/21  Yes [provider]  Elastic Bandages & Supports (T.E.D. BELOW KNEE/MEDIUM) MISC 1 each  by Does not apply route daily. Measure legs and give appropriate size. 09/12/21   Calvert Cantor, MD  feeding supplement, GLUCERNA SHAKE, (GLUCERNA SHAKE) LIQD Take 237 mLs by mouth 4 (four) times daily.    [provider]  finasteride (PROSCAR) 5 MG tablet Take 5 mg by mouth daily.    [provider]  lisinopril (ZESTRIL) 40 MG tablet Take 40 mg by mouth daily.    [provider]  naproxen (NAPROSYN) 500 MG tablet Take 500 mg by mouth 2 (two) times daily. 09/08/21   [provider]  Spacer/Aero-Holding Chambers (AEROCHAMBER PLUS) inhaler Use as instructed 03/23/18   Domenick Gong, MD    Social History   Socioeconomic History   Marital status: Married    Spouse name: Not on file   Number of children: Not on file   Years of education: Not on file   Highest education level: Not on file  Occupational History   Occupation: Conservation officer, nature  Tobacco Use   Smoking status: Every Day    Types: Pipe, Cigars, E-cigarettes    Last attempt to quit: 01/2018    Years since quitting: 4.1   Smokeless tobacco: Never   Tobacco comments:    Declines patch  Vaping Use   Vaping Use: Every day   Substances: Nicotine, Flavoring  Substance and Sexual Activity   Alcohol use: Not Currently    Alcohol/week: 3.0 standard drinks of alcohol    Types: 3 Cans of beer per week   Drug use: No   Sexual activity: Not on file  Other Topics Concern   Not on file  Social History Narrative   Uses crutches to ambulate   Social Determinants of Health   Financial Resource Strain: Not on file  Food Insecurity: No Food Insecurity (11/24/2021)   Hunger Vital Sign    Worried About Running Out of Food in the Last Year: Never true    Ran Out of Food in the Last Year: Never true  Transportation Needs: No Transportation Needs (11/24/2021)   PRAPARE - Administrator, Civil Service (Medical): No    Lack of Transportation (Non-Medical): No  Physical Activity: Not  on file  Stress:  Not on file  Social Connections: Not on file  Intimate Partner Violence: Not At Risk (11/24/2021)   Humiliation, Afraid, Rape, and Kick questionnaire    Fear of Current or Ex-Partner: No    Emotionally Abused: No    Physically Abused: No    Sexually Abused: No     Family History  Adopted: Yes  Family history unknown: Yes    ROS: Otherwise negative unless mentioned in HPI  Physical Examination  Vitals:   03/09/22 0405 03/09/22 0752  BP: (!) 138/98 (!) 151/110  Pulse: 98 (!) 110  Resp: 20 18  Temp: 97.9 F (36.6 C) 97.9 F (36.6 C)  SpO2: 100% 98%   Body mass index is 23.84 kg/m.  General:  WDWN in NAD Gait: Not observed HENT: WNL, normocephalic Pulmonary: normal non-labored breathing, without Rales, rhonchi,  wheezing Cardiac: regular, without  Murmurs, rubs or gallops; without carotid bruits Abdomen: positive bowel Sounds, soft, NT/ND, no masses Skin: without rashes Vascular Exam/Pulses: Palpable Extremities: without ischemic changes, without Gangrene , without cellulitis; without open wounds;  Musculoskeletal: no muscle wasting or atrophy  Neurologic: A&O X 3;  No focal weakness or paresthesias are detected; speech is fluent/normal Psychiatric:  The pt has Normal affect. Lymph:  Unremarkable  CBC    Component Value Date/Time   WBC 5.5 03/09/2022 0357   RBC 4.98 03/09/2022 0357   HGB 13.3 03/09/2022 0357   HGB 9.2 (L) 05/16/2017 1521   HCT 39.4 03/09/2022 0357   HCT 28.3 (L) 05/16/2017 1521   PLT 239 03/09/2022 0357   PLT 798 (H) 05/16/2017 1521   MCV 79.1 (L) 03/09/2022 0357   MCV 85 05/16/2017 1521   MCV 85 08/28/2013 0024   MCH 26.7 03/09/2022 0357   MCHC 33.8 03/09/2022 0357   RDW 15.2 03/09/2022 0357   RDW 13.8 05/16/2017 1521   RDW 13.1 08/28/2013 0024   LYMPHSABS 1.4 03/02/2022 1208   LYMPHSABS 0.7 05/16/2017 1521   MONOABS 0.7 03/02/2022 1208   EOSABS 0.0 03/02/2022 1208   EOSABS 0.0 05/16/2017 1521   BASOSABS 0.0 03/02/2022 1208    BASOSABS 0.0 05/16/2017 1521    BMET    Component Value Date/Time   NA 135 03/09/2022 0357   NA 139 07/07/2020 0844   NA 134 (L) 08/28/2013 0024   K 3.3 (L) 03/09/2022 0357   K 2.9 (L) 08/28/2013 0024   CL 101 03/09/2022 0357   CL 96 (L) 08/28/2013 0024   CO2 22 03/09/2022 0357   CO2 28 08/28/2013 0024   GLUCOSE 193 (H) 03/09/2022 0357   GLUCOSE 331 (H) 08/28/2013 0024   BUN 53 (H) 03/09/2022 0357   BUN 16 07/07/2020 0844   BUN 8 08/28/2013 0024   CREATININE 6.41 (H) 03/09/2022 0357   CREATININE 0.70 08/28/2013 0024   CALCIUM 8.9 03/09/2022 0357   CALCIUM 7.8 (L) 08/28/2013 0024   GFRNONAA 10 (L) 03/09/2022 0357   GFRNONAA >60 08/28/2013 0024   GFRAA >60 09/15/2018 1835   GFRAA >60 08/28/2013 0024    COAGS: Lab Results  Component Value Date   INR 1.2 03/06/2022   INR 1.0 09/09/2021   INR 0.9 08/28/2013     Non-Invasive Vascular Imaging:   None  Statin:  Yes.   Beta Blocker:  No. Aspirin:  No. ACEI:  No. ARB:  No. CCB use:  Yes Other antiplatelets/anticoagulants:  No.    ASSESSMENT/PLAN: This is a 42 y.o. male with AKI in need of  dialysis Perma Catheter. Discussed in detail the procedure, benefits, risks and complications. Answered all patients questions. He verbalizes his understanding. He would like to proceed with the procedure.   PLAN:  Vascular Lab for placement of tunneled dialysis catheter 03/10/22. Patient will be made NPO after midnight.     Drema Pry [facility] 03/09/2022 11:23 AM

## 2022-03-09 NOTE — Progress Notes (Addendum)
[facility]  ROUNDING NOTE   Subjective:   Patient seen resting quietly, no family at bedside Continues to make adequate urine output. Appetite remains poor   Objective:  Vital signs in last 24 hours:  Temp:  [97.8 F (36.6 C)-99.1 F (37.3 C)] 97.8 F (36.6 C) (02/01 1144) Pulse Rate:  [98-118] 115 (02/01 1144) Resp:  [10-20] 10 (02/01 1333) BP: (138-157)/(98-116) 140/107 (02/01 1144) SpO2:  [98 %-100 %] 100 % (02/01 1144) FiO2 (%):  [21 %] 21 % (01/31 2340)  Weight change: -3.7 kg Filed Weights   03/06/22 1627 03/07/22 0902 03/08/22 0756  Weight: 75 kg 70.7 kg 67 kg    Intake/Output: I/O last 3 completed shifts: In: 1320 [P.O.:1320] Out: 4450 [Urine:4450]   Intake/Output this shift:  Total I/O In: 240 [P.O.:240] Out: 975 [Urine:975]  Physical Exam: General: NAD  Head: Normocephalic, atraumatic. Moist oral mucosal membranes  Eyes: Anicteric,  Lungs:  Clear to auscultation, normal effort  Heart: Regular rate and rhythm  Abdomen:  Soft, nontender  Extremities: No lower extremity edema  Neurologic: Alert and oriented unable to answer simple questions.   Skin: No lesions  Access: Right IJ nontunneled catheter.    Basic Metabolic Panel: Recent Labs  Lab 03/04/22 0506 03/05/22 0602 03/06/22 0449 03/07/22 0423 03/08/22 0207 03/09/22 0357  NA 129* 134* 139 137 136 135  K 5.0 4.5 4.5 3.6 3.7 3.3*  CL 95* 101 103 103 101 101  CO2 17* 21* 20* 23 22 22   GLUCOSE 87 96 77 96 145* 193*  BUN 78* 51* 56* 32* 40* 53*  CREATININE 7.28* 6.29* 7.31* 5.56* 6.66* 6.41*  CALCIUM 8.1* 7.8* 7.8* 7.9* 8.7* 8.9  MG 2.8* 2.4 2.4 2.2 2.2  --   PHOS 7.6* 7.1* 6.8* 4.2 5.1*  --      Liver Function Tests: Recent Labs  Lab 03/04/22 0506 03/05/22 0602 03/06/22 0449 03/07/22 0423 03/08/22 0207  AST 34 52* 52* 51* 44*  ALT 13 17 16 19 21   ALKPHOS 61 69 67 76 84  BILITOT 0.8 0.8 0.6 0.8 1.2  PROT 5.8* 5.9* 5.9* 6.4* 7.1  ALBUMIN 2.9* 2.8* 2.8* 2.9* 3.2*     No results for input(s): "LIPASE", "AMYLASE" in the last 168 hours. No results for input(s): "AMMONIA" in the last 168 hours.  CBC: Recent Labs  Lab 03/05/22 0602 03/06/22 0449 03/07/22 0423 03/08/22 0207 03/09/22 0357  WBC 4.3 4.5 4.5 5.4 5.5  HGB 12.5* 11.9* 12.5* 13.3 13.3  HCT 36.9* 35.2* 36.9* 39.7 39.4  MCV 79.2* 79.8* 79.7* 79.7* 79.1*  PLT 179 194 196 228 239     Cardiac Enzymes: No results for input(s): "CKTOTAL", "CKMB", "CKMBINDEX", "TROPONINI" in the last 168 hours.  BNP: Invalid input(s): "POCBNP"  CBG: Recent Labs  Lab 03/08/22 1139 03/08/22 1624 03/08/22 2107 03/09/22 0757 03/09/22 1141  GLUCAP 200* 192* 3* 154* 305*     Microbiology: Results for orders placed or performed during the hospital encounter of 03/02/22  Culture, blood (Routine X 2) w Reflex to ID Panel     Status: None   Collection Time: 03/02/22  2:15 PM   Specimen: BLOOD  Result Value Ref Range Status   Specimen Description BLOOD LEFT ANTECUBITAL  Final   Special Requests   Final    BOTTLES DRAWN AEROBIC AND ANAEROBIC Blood Culture adequate volume   Culture   Final    NO GROWTH 5 DAYS Performed at [facility], [address] [place], [place]  [address]    Report Status 03/07/2022 FINAL  Final  Culture, blood (Routine X 2) w Reflex to ID Panel     Status: None   Collection Time: 03/02/22  2:20 PM   Specimen: BLOOD  Result Value Ref Range Status   Specimen Description BLOOD RIGHT ANTECUBITAL  Final   Special Requests   Final    BOTTLES DRAWN AEROBIC AND ANAEROBIC Blood Culture adequate volume   Culture   Final    NO GROWTH 5 DAYS Performed at [facility], [address] [place], [place] [address]    Report Status 03/07/2022 FINAL  Final  Urine Culture (for pregnant, neutropenic or urologic patients or patients with an indwelling urinary catheter)     Status: Abnormal   Collection Time: 03/02/22  3:53 PM   Specimen: Urine, Clean Catch  Result  Value Ref Range Status   Specimen Description   Final    URINE, CLEAN CATCH Performed at [facility], [address] [place], [place] [address]    Special Requests   Final    NONE Performed at [facility], [address] [place], [place] [address]    Culture (A)  Final    >=100,000 COLONIES/mL ENTEROCOCCUS FAECALIS >=100,000 COLONIES/mL LACTOBACILLUS SPECIES Standardized susceptibility testing for this organism is not available. Performed at [facility], [place] [address] [place], [place] [address]    Report Status 03/05/2022 FINAL  Final   Organism ID, Bacteria ENTEROCOCCUS FAECALIS (A)  Final      Susceptibility   Enterococcus faecalis - MIC*    AMPICILLIN <=2 SENSITIVE Sensitive     NITROFURANTOIN <=16 SENSITIVE Sensitive     VANCOMYCIN 1 SENSITIVE Sensitive     * >=100,000 COLONIES/mL ENTEROCOCCUS FAECALIS  MRSA Next Gen by PCR, Nasal     Status: None   Collection Time: 03/03/22  6:05 AM   Specimen: Nasal Mucosa; Nasal Swab  Result Value Ref Range Status   MRSA by PCR Next Gen NOT DETECTED NOT DETECTED Final    Comment: (NOTE) The GeneXpert MRSA Assay (FDA approved for NASAL specimens only), is one component of a comprehensive MRSA colonization surveillance program. It is not intended to diagnose MRSA infection nor to guide or monitor treatment for MRSA infections. Test performance is not FDA approved in patients less than 52 years old. Performed at [facility], [address] [place],  [address]     Coagulation Studies: Recent Labs    03/06/22 1726  LABPROT 15.3*  INR 1.2     Urinalysis: No results for input(s): "COLORURINE", "LABSPEC", "PHURINE", "GLUCOSEU", "HGBUR", "BILIRUBINUR", "KETONESUR", "PROTEINUR", "UROBILINOGEN", "NITRITE", "LEUKOCYTESUR" in the last 72 hours.  Invalid input(s): "APPERANCEUR"     Imaging: No results found.   Medications:    anticoagulant sodium citrate      acyclovir  200 mg  Oral BID   amLODipine  10 mg Oral QPM   bictegravir-emtricitabine-tenofovir AF  1 tablet Oral Daily   Chlorhexidine Gluconate Cloth  6 each Topical Q0600   docusate sodium  100 mg Oral BID   heparin  5,000 Units Subcutaneous Q8H   hydrALAZINE  50 mg Oral TID   insulin aspart  0-6 Units Subcutaneous TID WC   isosorbide dinitrate  20 mg Oral TID   lidocaine  1 patch Transdermal Q24H   metoprolol tartrate  50 mg Oral BID   polyethylene glycol  17 g Oral BID   rosuvastatin  10 mg Oral QHS   sodium  bicarbonate  1,300 mg Oral BID   albuterol, alteplase, anticoagulant sodium citrate, cyclobenzaprine, heparin, hydrALAZINE, lidocaine (PF), lidocaine-prilocaine, LORazepam, melatonin, nicotine, nitroGLYCERIN, ondansetron **OR** ondansetron (ZOFRAN) IV, pentafluoroprop-tetrafluoroeth, senna-docusate  Assessment/ Plan:  [name] is a 42 y.o.  male with past medical conditions including depression, HIV, hyperlipidemia, hypertension, BPH, diabetes who was admitted to [facility] on 03/02/2022 for Hyperkalemia [E87.5] Hypoglycemia [E16.2] Acute renal failure, unspecified acute renal failure type ([place]) [N17.9]   Acute kidney injury with proteinuria/hyperkalemia/hyponatremia.  Creatinine 5.05 on ED admission.  AKI appears multifactorial due to recent UTI infection, hypovolemia, and NSAID use.  NSAID use can cause glomular disease and FSGS, resulting in proteinuria.  Other differential includes HIV-associated nephropathy.  Patient also has underlying insulin-dependent diabetes.  Hemoglobin A1c 7.8% from 09/09/2021.   UPC ratio 3.95  on 03/04/2022.  Renal ultrasound negative for obstruction. Serologic workup complements-C3-C4 normal.  ANA negative,  anca, NEG. kappa /Lambda ration mildly elevated.   Baseline creatinine of 0.78 on November 28, 2021. Creatinine remains elevated. Urine output has improved. Appears patient will need dialysis at discharge. Will consult vascular surgery for permcath placement. Will  provide dialysis treatment today. Renal navigator aware of patient and will begin seeking outpatient placement.  Patient currently prescribed Biktarvy, unsure if renal dosing is required for this medication. Will defer to ID.    Lab Results  Component Value Date   CREATININE 6.41 (H) 03/09/2022   CREATININE 6.66 (H) 03/08/2022   CREATININE 5.56 (H) 03/07/2022    Intake/Output Summary (Last 24 hours) at 03/09/2022 1433 Last data filed at 03/09/2022 1419 Gross per 24 hour  Intake 720 ml  Output 3175 ml  Net -2455 ml    2. Hypertension, essential. Home regimen includes amlodipine, clonidine, isosorbide, lisinopril and metoprolol.  Continue amlodipine, hydralazine, isosorbide dinitrate. Blood pressure elevated today, 151/110    LOS: 7 Salem Mastrogiovanni 2/1/20242:33 PM

## 2022-03-09 NOTE — Consult Note (Signed)
Hospital Consult    Reason for Consult:  Dialysis Perma Catheter Placement  Requesting Physician:  Dorothyann Gibbs NP MRN #:  [id number]  History of Present Illness: This is a 42 y.o. 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 Noted to have hyperkalemia and AKI and urinary tract infection. Vascular surgery saw patient and placed temporary dialysis catheter on 1/27. After several sessions of hemodialysis, plan is for renal biopsy which unable to do until 2/5 due to patient being on aspirin.   Vascular Surgery now consulted to place long term dialysis Perma Catheter for continued dialysis     Past Medical History:  Diagnosis Date   (HFpEF) heart failure with preserved ejection fraction (HCC)    Acid reflux    Adopted    Anxiety    Arthritis    Arthrogryposis    Asthma    BPH (benign prostatic hyperplasia)    Bronchitis    Chronic pain syndrome    Depressive disorder, not elsewhere classified    Diabetes mellitus without complication ([place])    Diverticulitis of colon (without mention of hemorrhage)(562.11)    ED (erectile dysfunction)    Herpes genitalis    HIV infection ([place])    HTN (hypertension)    Hyperlipidemia    Hypertensive cardiomyopathy ([place])    Hypogonadism in male    OAB (overactive bladder)    Other psoriasis    Seizure disorder (HCC)    Sleep apnea    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 or other cause, without mention of thyrotoxic crisis or storm    hyperthyroidism    Past Surgical History:  Procedure Laterality Date   APPENDECTOMY     COLOSTOMY     CORNEAL TRANSPLANT     feet surgery     both   HAND SURGERY     left and right   leg surgery     left and right   ORTHOPEDIC SURGERY     hands, feet, knees, legs   tubes in ears     both    Allergies  Allergen Reactions    Penicillins Hives, Itching and Rash    Has patient had a PCN reaction causing immediate rash, facial/tongue/throat swelling, SOB or lightheadedness with hypotension: Yes Has patient had a PCN reaction causing severe rash involving mucus membranes or skin necrosis: No Has patient had a PCN reaction that required hospitalization No Has patient had a PCN reaction occurring within the last 10 years: No If all of the above answers are "NO", then may proceed with Cephalosporin use.  Has tolerated amoxicillin without problems   Erythromycin Base Itching and Rash    Prior to Admission medications   Medication Sig Start Date End Date Taking? Authorizing Provider  acyclovir (ZOVIRAX) 400 MG tablet Take 1 tablet (400 mg total) by mouth 3 (three) times daily. 02/12/22 03/14/22 Yes White, Adrienne R, NP  albuterol (PROVENTIL HFA;VENTOLIN HFA) 108 (90 BASE) MCG/ACT inhaler Inhale 2 puffs into the lungs every 4 (four) hours as needed for wheezing or shortness of breath.    Yes [provider]  albuterol (PROVENTIL) (2.5 MG/3ML) 0.083% nebulizer solution Take 2.5 mg by nebulization every 4 (four) hours as needed for wheezing or shortness of breath.   Yes [provider]  amLODipine (NORVASC) 10 MG tablet Take 10 mg by mouth daily.   Yes  [provider]  aspirin EC 81 MG tablet Take 81 mg by mouth daily. Swallow whole.   Yes [provider]  BIKTARVY 50-200-25 MG TABS tablet Take 1 tablet by mouth daily. 11/21/16  Yes [provider]  buprenorphine (BUTRANS) 7.5 MCG/HR Place 7.5 mg onto the skin once a week. Saturday   Yes [provider]  buPROPion (WELLBUTRIN XL) 150 MG 24 hr tablet Take 150 mg by mouth daily. 08/11/21  Yes [provider]  cetirizine (ZYRTEC) 10 MG tablet Take 10 mg by mouth daily.   Yes [provider]  chlorthalidone (HYGROTON) 25 MG tablet Take 25 mg by mouth daily.   Yes [provider]  citalopram (CELEXA) 20  MG tablet Take 20 mg by mouth daily.   Yes [provider]  cloNIDine (CATAPRES - DOSED IN MG/24 HR) 0.3 mg/24hr patch 0.3 mg once a week. 06/13/21  Yes [provider]  diclofenac sodium (VOLTAREN) 1 % GEL Apply 2-4 g topically 4 (four) times daily as needed. For pain.   Yes [provider]  dicyclomine (BENTYL) 10 MG capsule Take 20 mg by mouth 2 (two) times daily before a meal. 05/01/21  Yes [provider]  empagliflozin (JARDIANCE) 25 MG TABS tablet Take 25 mg by mouth daily. 01/19/20  Yes [provider]  famotidine (PEPCID) 20 MG tablet Take 20 mg by mouth daily.  01/03/16  Yes [provider]  ferrous sulfate 325 (65 FE) MG tablet Take 325 mg by mouth daily with breakfast.   Yes [provider]  fluticasone (FLONASE) 50 MCG/ACT nasal spray Place 2 sprays into both nostrils daily.   Yes [provider]  fluticasone (FLOVENT HFA) 110 MCG/ACT inhaler Inhale 2 puffs into the lungs 2 (two) times daily.   Yes [provider]  gabapentin (NEURONTIN) 300 MG capsule Take 1 capsule (300 mg total) by mouth 2 (two) times daily. Patient taking differently: Take 300 mg by mouth 3 (three) times daily. 09/12/21 03/02/22 Yes Debbe Odea, MD  glucose 4 GM chewable tablet Chew 1 tablet by mouth as needed for low blood sugar.   Yes [provider]  insulin degludec (TRESIBA) 100 UNIT/ML FlexTouch Pen Inject 100 Units into the skin 2 (two) times daily.   Yes [provider]  ipratropium (ATROVENT) 0.06 % nasal spray Place 2 sprays into both nostrils 4 (four) times daily. 08/29/21  Yes Margarette Canada, NP  isosorbide mononitrate (IMDUR) 60 MG 24 hr tablet Take 60 mg by mouth daily. 07/01/21  Yes [provider]  ketoconazole (NIZORAL) 2 % shampoo Apply 1 application topically 2 (two) times a week. tues and thursday 08/27/14  Yes [provider]  Melatonin 5 MG TABS Take 10 mg by mouth at bedtime as needed  (sleep).   Yes [provider]  metFORMIN (GLUCOPHAGE-XR) 500 MG 24 hr tablet Take 1,000 mg by mouth 2 (two) times daily.   Yes [provider]  metoprolol tartrate (LOPRESSOR) 100 MG tablet TAKE 1 AND 1/2 TABLETS(150 MG) BY MOUTH TWICE DAILY 10/31/21  Yes Gollan, Kathlene November, MD  mirtazapine (REMERON) 45 MG tablet Take 45 mg by mouth at bedtime.   Yes [provider]  Multiple Vitamin (MULTIVITAMIN) tablet Take 1 tablet by mouth daily.   Yes [provider]  nitroGLYCERIN (NITROSTAT) 0.4 MG SL tablet PLACE 1 TABLET SUBLINGUALLY EVERY 5 MINUTES AS NEEDED FOR CHEST PAIN. CALL 911 IF YOU HAVE TAKEN 3 NITRO FOR CHEST PAIN 09/21/21  Yes Antonieta Iba, MD  nortriptyline (PAMELOR) 25 MG capsule Take 50 mg by mouth at bedtime.   Yes [provider]  omega-3 acid ethyl esters (LOVAZA) 1 g capsule Take 1 g by mouth 3 (three) times daily.   Yes [provider]  omeprazole (PRILOSEC) 20 MG capsule Take 20 mg by mouth daily. 09/08/21  Yes [provider]  ondansetron (ZOFRAN-ODT) 4 MG disintegrating tablet Take 4 mg by mouth every 8 (eight) hours as needed for nausea or vomiting.   Yes [provider]  polyethylene glycol (MIRALAX / GLYCOLAX) 17 g packet Take 17 g by mouth 3 (three) times daily.   Yes [provider]  rosuvastatin (CRESTOR) 40 MG tablet TAKE 1 TABLET(40 MG) BY MOUTH DAILY 04/30/19  Yes Gollan, Tollie Pizza, MD  solifenacin (VESICARE) 10 MG tablet Take 10 mg by mouth daily.   Yes [provider]  spironolactone (ALDACTONE) 50 MG tablet Take 100 mg by mouth daily. 06/15/21  Yes [provider]  tiZANidine (ZANAFLEX) 4 MG tablet Take 1 tablet (4 mg total) by mouth 3 (three) times daily. 07/06/21 07/06/22 Yes Varney Daily, PA  TRULICITY 4.5 MG/0.5ML SOPN SMARTSIG:4.5 Milligram(s) SUB-Q Once a Week 09/23/21  Yes [provider]  Elastic Bandages & Supports (T.E.D. BELOW KNEE/MEDIUM) MISC 1 each  by Does not apply route daily. Measure legs and give appropriate size. 09/12/21   Calvert Cantor, MD  feeding supplement, GLUCERNA SHAKE, (GLUCERNA SHAKE) LIQD Take 237 mLs by mouth 4 (four) times daily.    [provider]  finasteride (PROSCAR) 5 MG tablet Take 5 mg by mouth daily.    [provider]  lisinopril (ZESTRIL) 40 MG tablet Take 40 mg by mouth daily.    [provider]  naproxen (NAPROSYN) 500 MG tablet Take 500 mg by mouth 2 (two) times daily. 09/08/21   [provider]  Spacer/Aero-Holding Chambers (AEROCHAMBER PLUS) inhaler Use as instructed 03/23/18   Domenick Gong, MD    Social History   Socioeconomic History   Marital status: Married    Spouse name: Not on file   Number of children: Not on file   Years of education: Not on file   Highest education level: Not on file  Occupational History   Occupation: Conservation officer, nature  Tobacco Use   Smoking status: Every Day    Types: Pipe, Cigars, E-cigarettes    Last attempt to quit: 01/2018    Years since quitting: 4.1   Smokeless tobacco: Never   Tobacco comments:    Declines patch  Vaping Use   Vaping Use: Every day   Substances: Nicotine, Flavoring  Substance and Sexual Activity   Alcohol use: Not Currently    Alcohol/week: 3.0 standard drinks of alcohol    Types: 3 Cans of beer per week   Drug use: No   Sexual activity: Not on file  Other Topics Concern   Not on file  Social History Narrative   Uses crutches to ambulate   Social Determinants of Health   Financial Resource Strain: Not on file  Food Insecurity: No Food Insecurity (11/24/2021)   Hunger Vital Sign    Worried About Running Out of Food in the Last Year: Never true    Ran Out of Food in the Last Year: Never true  Transportation Needs: No Transportation Needs (11/24/2021)   PRAPARE - Administrator, Civil Service (Medical): No    Lack of Transportation (Non-Medical): No  Physical Activity: Not  on file  Stress:  Not on file  Social Connections: Not on file  Intimate Partner Violence: Not At Risk (11/24/2021)   Humiliation, Afraid, Rape, and Kick questionnaire    Fear of Current or Ex-Partner: No    Emotionally Abused: No    Physically Abused: No    Sexually Abused: No     Family History  Adopted: Yes  Family history unknown: Yes    ROS: Otherwise negative unless mentioned in HPI  Physical Examination  Vitals:   03/09/22 0405 03/09/22 0752  BP: (!) 138/98 (!) 151/110  Pulse: 98 (!) 110  Resp: 20 18  Temp: 97.9 F (36.6 C) 97.9 F (36.6 C)  SpO2: 100% 98%   Body mass index is 23.84 kg/m.  General:  WDWN in NAD Gait: Not observed HENT: WNL, normocephalic Pulmonary: normal non-labored breathing, without Rales, rhonchi,  wheezing Cardiac: regular, without  Murmurs, rubs or gallops; without carotid bruits Abdomen: positive bowel Sounds, soft, NT/ND, no masses Skin: without rashes Vascular Exam/Pulses: Palpable Extremities: without ischemic changes, without Gangrene , without cellulitis; without open wounds;  Musculoskeletal: no muscle wasting or atrophy  Neurologic: A&O X 3;  No focal weakness or paresthesias are detected; speech is fluent/normal Psychiatric:  The pt has Normal affect. Lymph:  Unremarkable  CBC    Component Value Date/Time   WBC 5.5 03/09/2022 0357   RBC 4.98 03/09/2022 0357   HGB 13.3 03/09/2022 0357   HGB 9.2 (L) 05/16/2017 1521   HCT 39.4 03/09/2022 0357   HCT 28.3 (L) 05/16/2017 1521   PLT 239 03/09/2022 0357   PLT 798 (H) 05/16/2017 1521   MCV 79.1 (L) 03/09/2022 0357   MCV 85 05/16/2017 1521   MCV 85 08/28/2013 0024   MCH 26.7 03/09/2022 0357   MCHC 33.8 03/09/2022 0357   RDW 15.2 03/09/2022 0357   RDW 13.8 05/16/2017 1521   RDW 13.1 08/28/2013 0024   LYMPHSABS 1.4 03/02/2022 1208   LYMPHSABS 0.7 05/16/2017 1521   MONOABS 0.7 03/02/2022 1208   EOSABS 0.0 03/02/2022 1208   EOSABS 0.0 05/16/2017 1521   BASOSABS 0.0 03/02/2022 1208    BASOSABS 0.0 05/16/2017 1521    BMET    Component Value Date/Time   NA 135 03/09/2022 0357   NA 139 07/07/2020 0844   NA 134 (L) 08/28/2013 0024   K 3.3 (L) 03/09/2022 0357   K 2.9 (L) 08/28/2013 0024   CL 101 03/09/2022 0357   CL 96 (L) 08/28/2013 0024   CO2 22 03/09/2022 0357   CO2 28 08/28/2013 0024   GLUCOSE 193 (H) 03/09/2022 0357   GLUCOSE 331 (H) 08/28/2013 0024   BUN 53 (H) 03/09/2022 0357   BUN 16 07/07/2020 0844   BUN 8 08/28/2013 0024   CREATININE 6.41 (H) 03/09/2022 0357   CREATININE 0.70 08/28/2013 0024   CALCIUM 8.9 03/09/2022 0357   CALCIUM 7.8 (L) 08/28/2013 0024   GFRNONAA 10 (L) 03/09/2022 0357   GFRNONAA >60 08/28/2013 0024   GFRAA >60 09/15/2018 1835   GFRAA >60 08/28/2013 0024    COAGS: Lab Results  Component Value Date   INR 1.2 03/06/2022   INR 1.0 09/09/2021   INR 0.9 08/28/2013     Non-Invasive Vascular Imaging:   None  Statin:  Yes.   Beta Blocker:  No. Aspirin:  No. ACEI:  No. ARB:  No. CCB use:  Yes Other antiplatelets/anticoagulants:  No.    ASSESSMENT/PLAN: This is a 42 y.o. male with AKI in need of  dialysis Perma Catheter. Discussed in detail the procedure, benefits, risks and complications. Answered all patients questions. He verbalizes his understanding. He would like to proceed with the procedure.   PLAN:  Vascular Lab for placement of tunneled dialysis catheter 03/10/22. Patient will be made NPO after midnight.     Drema Pry [facility] 03/09/2022 11:23 AM

## 2022-03-09 NOTE — Progress Notes (Signed)
[facility] Progress Note  Patient: Cory Campbell    RWE:[id number]  DOA: 03/02/2022    Date of Service: the patient was seen and examined on 03/09/2022  Brief hospital course: Mr. 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  Noted to have hyperkalemia and AKI and urinary tract infection.  Nephrology consulted workup underway as well as discussion to start dialysis.  Vascular surgery saw patient and placed temporary dialysis catheter on 1/27.  After several sessions of hemodialysis, plan is for renal biopsy which unable to do until 2/5 due to patient being on aspirin.  Showing some signs of improvement in creatinine, although still needs hemodialysis.  Vascular surgery taking for PermCath placement 2/2.   Assessment and Plan: * Hyperkalemia-resolved Secondary to worsening renal failure.  Status post Lokelma.  Metabolic acidosis Secondary to uremia  AKI (acute kidney injury) ([place]) in the setting of stage IV chronic kidney disease - Requiring dialysis.  Workup in progress.  Waiting for renal biopsy after 5 days of aspirin clearance.  Getting intermittent dialysis.  Vascular surgery taking for PermCath on 2/2  UTI - Completed antibiotic course  HIV (human immunodeficiency virus infection) ([place]) - May need to change medication from [facility] if creatinine clearance does not improve.  Essential hypertension Home medications resumed with additional medications added.  Blood pressure still elevated  Tobacco dependence - As needed nicotine patch ordered  Hyponatremia - Secondary to hypervolemia.  Sodium now normalized  Weakness - Multifactorial  Seizure disorder (HCC) - Ativan 2 mg IV as needed for seizure, 2 doses ordered  Frequent falls - Fall precaution  Sleep apnea -  CPAP nightly ordered  Insulin dependent type 2 diabetes mellitus (HCC) - Insulin SSI with at bedtime coverage ordered CBGs starting to trend upward, so increasing sliding scale from very sensitive to sensitive       Body mass index is 23.91 kg/m.        Consultants: Vascular surgery Interventional radiology Nephrology  Procedures: Placement of dialysis catheter PermCath placement planned 2/2 Renal biopsy possibly 2/5  Antimicrobials: Completed course of Rocephin/Augmentin for UTI  Code Status: Full code   Subjective: Left knee pain improving  Objective: Vital signs were reviewed and unremarkable. Vitals:   03/09/22 1631 03/09/22 1700  BP: (!) 155/121 (!) 149/117  Pulse: 99 (!) 104  Resp: 15 15  Temp:    SpO2: 100% 100%    Intake/Output Summary (Last 24 hours) at 03/09/2022 1703 Last data filed at 03/09/2022 1419 Gross per 24 hour  Intake 720 ml  Output 2775 ml  Net -2055 ml    Filed Weights   03/07/22 0902 03/08/22 0756 03/09/22 1559  Weight: 70.7 kg 67 kg 67.2 kg   Body mass index is 23.91 kg/m.  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 Between 7PM-7AM, please contact night-coverage If you still have difficulty reaching the attending provider, please page the [facility] (Director on Call) for [facility] on amion for assistance.

## 2022-03-10 ENCOUNTER — Encounter
Admission: EM | Disposition: A | Discharge status: Home / Self Care | Payer: Self-pay | Source: Home / Self Care | Attending: Internal Medicine

## 2022-03-10 ENCOUNTER — Encounter: Payer: Self-pay | Admitting: Vascular Surgery

## 2022-03-10 DIAGNOSIS — N179 Acute kidney failure, unspecified: Secondary | ICD-10-CM | POA: Diagnosis not present

## 2022-03-10 DIAGNOSIS — E871 Hypo-osmolality and hyponatremia: Secondary | ICD-10-CM | POA: Diagnosis not present

## 2022-03-10 DIAGNOSIS — E875 Hyperkalemia: Secondary | ICD-10-CM | POA: Diagnosis not present

## 2022-03-10 DIAGNOSIS — N189 Chronic kidney disease, unspecified: Secondary | ICD-10-CM

## 2022-03-10 DIAGNOSIS — B20 Human immunodeficiency virus [HIV] disease: Secondary | ICD-10-CM | POA: Diagnosis not present

## 2022-03-10 HISTORY — PX: DIALYSIS/PERMA CATHETER INSERTION: CATH118288

## 2022-03-10 LAB — RENAL FUNCTION PANEL
Albumin: 3.5 g/dL (ref 3.5–5.0)
Anion gap: 14 (ref 5–15)
BUN: 41 mg/dL — ABNORMAL HIGH (ref 6–20)
CO2: 22 mmol/L (ref 22–32)
Calcium: 8.8 mg/dL — ABNORMAL LOW (ref 8.9–10.3)
Chloride: 99 mmol/L (ref 98–111)
Creatinine, Ser: 4.36 mg/dL — ABNORMAL HIGH (ref 0.61–1.24)
GFR, Estimated: 16 mL/min — ABNORMAL LOW (ref >60)
Glucose, Bld: 202 mg/dL — ABNORMAL HIGH (ref 70–99)
Phosphorus: 3.5 mg/dL (ref 2.5–4.6)
Potassium: 3.1 mmol/L — ABNORMAL LOW (ref 3.5–5.1)
Sodium: 135 mmol/L (ref 135–145)

## 2022-03-10 LAB — GLUCOSE, CAPILLARY
Glucose-Capillary: 208 mg/dL — ABNORMAL HIGH (ref 70–99)
Glucose-Capillary: 227 mg/dL — ABNORMAL HIGH (ref 70–99)
Glucose-Capillary: 185 mg/dL — ABNORMAL HIGH (ref 70–99)
Glucose-Capillary: 315 mg/dL — ABNORMAL HIGH (ref 70–99)

## 2022-03-10 SURGERY — DIALYSIS/PERMA CATHETER INSERTION
Anesthesia: Moderate Sedation | Laterality: Right

## 2022-03-10 MED ORDER — FENTANYL CITRATE (PF) 100 MCG/2ML IJ SOLN
INTRAMUSCULAR | Status: AC
  Filled 2022-03-10: qty 2

## 2022-03-10 MED ORDER — MIDAZOLAM HCL 5 MG/5ML IJ SOLN
INTRAMUSCULAR | Status: AC
  Filled 2022-03-10: qty 5

## 2022-03-10 MED ORDER — FENTANYL CITRATE (PF) 100 MCG/2ML IJ SOLN
INTRAMUSCULAR | Status: DC | PRN
  Administered 2022-03-10: 50 ug via INTRAVENOUS

## 2022-03-10 MED ORDER — MIDAZOLAM HCL 2 MG/2ML IJ SOLN
INTRAMUSCULAR | Status: DC | PRN
  Administered 2022-03-10: 2 mg via INTRAVENOUS

## 2022-03-10 MED ORDER — CIPROFLOXACIN IN D5W 400 MG/200ML IV SOLN
INTRAVENOUS | Status: DC | PRN
  Administered 2022-03-10: 400 mg via INTRAVENOUS

## 2022-03-10 MED ORDER — ONDANSETRON HCL 4 MG/2ML IJ SOLN
4.0000 mg | Freq: Once | INTRAMUSCULAR | Status: AC
  Administered 2022-03-10: 4 mg via INTRAVENOUS
  Filled 2022-03-10: qty 2

## 2022-03-10 SURGICAL SUPPLY — 10 items
ADH SKN CLS APL DERMABOND .7 (GAUZE/BANDAGES/DRESSINGS) ×1
CATH PALINDROME-SP 14.5FX23 (CATHETERS) IMPLANT
DERMABOND ADVANCED .7 DNX12 (GAUZE/BANDAGES/DRESSINGS) IMPLANT
NDL ENTRY 21GA 7CM ECHOTIP (NEEDLE) IMPLANT
NEEDLE ENTRY 21GA 7CM ECHOTIP (NEEDLE) ×1 IMPLANT
PACK ANGIOGRAPHY (CUSTOM PROCEDURE TRAY) IMPLANT
SET INTRO CAPELLA COAXIAL (SET/KITS/TRAYS/PACK) IMPLANT
SUT MNCRL AB 4-0 PS2 18 (SUTURE) IMPLANT
SUT SILK 0 FSL (SUTURE) IMPLANT
WIRE GUIDERIGHT .035X150 (WIRE) IMPLANT

## 2022-03-10 NOTE — Interval H&P Note (Signed)
History and Physical Interval Note:  03/10/2022 12:23 PM  Cory Campbell  has presented today for surgery, with the diagnosis of ESRD.  The various methods of treatment have been discussed with the patient and family. After consideration of risks, benefits and other options for treatment, the patient has consented to  Procedure(s): DIALYSIS/PERMA CATHETER INSERTION (Right) as a surgical intervention.  The patient's history has been reviewed, patient examined, no change in status, stable for surgery.  I have reviewed the patient's chart and labs.  Questions were answered to the patient's satisfaction.     [name]

## 2022-03-10 NOTE — Discharge Planning (Signed)
Working on outpatient dialysis placement at [facility].

## 2022-03-10 NOTE — Inpatient Diabetes Management (Signed)
Inpatient Diabetes Program Recommendations  AACE/ADA: New Consensus Statement on Inpatient Glycemic Control (2015)  Target Ranges:  Prepandial:   less than 140 mg/dL      Peak postprandial:   less than 180 mg/dL (1-2 hours)      Critically ill patients:  140 - 180 mg/dL   Lab Results  Component Value Date   GLUCAP 208 (H) 03/10/2022   HGBA1C 8.2 (H) 03/09/2022    Review of Glycemic Control  Latest Reference Range & Units 03/08/22 07:52 03/08/22 11:39 03/08/22 16:24 03/08/22 21:07 03/09/22 07:57 03/09/22 11:41 03/10/22 08:48  Glucose-Capillary 70 - 99 mg/dL 165 (H) 200 (H) 192 (H) 232 (H) 154 (H) 305 (H) 208 (H)   Diabetes history: DM 2 Outpatient Diabetes medications: Jardiance 25 mg Daily, Tresiba 100 units bid, metfomrin 5374 mg bid, Trulicity 4.5 mg Weekly Current orders for Inpatient glycemic control:  Novolog 0-9 units tid + hs Note Glucose trends increase after meal intake due to steroid   Inpatient Diabetes Program Recommendations:    -  Consider Novolog 2 units tid meal coverage if eating >50% of meals.  Thanks,  Tama Headings RN, MSN, BC-ADM Inpatient Diabetes Coordinator Team Pager [phone] (8a-5p)

## 2022-03-10 NOTE — Op Note (Signed)
OPERATIVE NOTE   PROCEDURE: Insertion of tunneled dialysis catheter left IJ approach with ultrasound and fluoroscopic guidance.  PRE-OPERATIVE DIAGNOSIS: Acute on chronic renal insufficiency  POST-OPERATIVE DIAGNOSIS: Same  SURGEON: [name].  ANESTHESIA: Conscious sedation was administered under my direct supervision by the interventional radiology RN. IV Versed plus fentanyl were utilized. Continuous ECG, pulse oximetry and blood pressure was monitored throughout the entire procedure. Conscious sedation was for a total of 26 minutes.  ESTIMATED BLOOD LOSS: Minimal cc  CONTRAST USED:  None  FLUOROSCOPY TIME: 0.5 minutes  INDICATIONS:   Cory Campbell a 42 y.o. y.o. male who presents with acute on chronic renal insufficiency now requiring hemodialysis.  His kidneys have not improved while receiving dialysis through a temporary catheter and tunneled catheter is now requested.  Risks and benefits were reviewed all questions answered patient agrees to proceed.  DESCRIPTION: After obtaining full informed written consent, the patient was positioned supine. The left neck and chest wall was prepped and draped in a sterile fashion. Ultrasound was placed in a sterile sleeve. Ultrasound was utilized to identify the left internal jugular vein which is noted to be echolucent and compressible indicating patency. Image is recorded for the permanent record. Under direct ultrasound visualization a micro-needle is inserted into the vein followed by the micro-wire. Micro-sheath was then advanced and a J wire is inserted without difficulty under fluoroscopic guidance. Small counterincision was made at the wire insertion site. Dilators are passed over the wire and the tunneled dialysis catheter is fed into the central venous system without difficulty.  Under fluoroscopy the catheter tip positioned at the atrial caval junction. The catheter is then approximated to the left chest wall and an exit site  selected. 1% lidocaine is infiltrated in soft tissues at this level small incision is made and the tunneling device is then passed from the exit site to the left neck counterincision. Catheter is then connected to the tunneling device and the catheter was pulled subcutaneously.  It is then fed through the peel-away sheath and the peel-away sheath removed.  Under fluoroscopy the catheter is adjusted so that the tip of the catheter is in the mid atrium.  Both lumens aspirate and flush easily. After verification of smooth contour with proper tip position under fluoroscopy the catheter is packed with 5000 units of heparin per lumen.  Catheter secured to the skin of the left chest with 0 silk. A sterile dressing is applied with a Biopatch.  COMPLICATIONS: None  CONDITION: Good  [name] [name] renovascular. Office:  [phone]   03/10/2022,12:56 PM

## 2022-03-10 NOTE — Progress Notes (Addendum)
[facility]  ROUNDING NOTE   Subjective:   Patient seen resting in bed, speaking with wife on speaker phone States he continues to feel better each day Appetite slowly improving, currently n.p.o. Room air No lower extremity edema   Objective:  Vital signs in last 24 hours:  Temp:  [97.4 F (36.3 C)-98.6 F (37 C)] 98.1 F (36.7 C) (02/02 1353) Pulse Rate:  [93-108] 101 (02/02 1353) Resp:  [8-20] 14 (02/02 1353) BP: (124-163)/(83-122) 135/100 (02/02 1353) SpO2:  [96 %-100 %] 100 % (02/02 1353) FiO2 (%):  [21 %] 21 % (02/01 2252) Weight:  [67.1 kg-67.2 kg] 67.1 kg (02/01 1909)  Weight change: 0.2 kg Filed Weights   03/08/22 0756 03/09/22 1559 03/09/22 1909  Weight: 67 kg 67.2 kg 67.1 kg    Intake/Output: I/O last 3 completed shifts: In: 480 [P.O.:480] Out: 3525 [Urine:3525]   Intake/Output this shift:  Total I/O In: -  Out: 300 [Urine:300]  Physical Exam: General: NAD  Head: Normocephalic, atraumatic. Moist oral mucosal membranes  Eyes: Anicteric,  Lungs:  Clear to auscultation, normal effort  Heart: Regular rate and rhythm  Abdomen:  Soft, nontender  Extremities: No lower extremity edema  Neurologic: Alert and oriented unable to answer simple questions.   Skin: No lesions  Access: Right IJ nontunneled catheter.    Basic Metabolic Panel: Recent Labs  Lab 03/04/22 0506 03/05/22 0602 03/06/22 0449 03/07/22 0423 03/08/22 0207 03/09/22 0357 03/10/22 0906  NA 129* 134* 139 137 136 135 135  K 5.0 4.5 4.5 3.6 3.7 3.3* 3.1*  CL 95* 101 103 103 101 101 99  CO2 17* 21* 20* 23 22 22 22   GLUCOSE 87 96 77 96 145* 193* 202*  BUN 78* 51* 56* 32* 40* 53* 41*  CREATININE 7.28* 6.29* 7.31* 5.56* 6.66* 6.41* 4.36*  CALCIUM 8.1* 7.8* 7.8* 7.9* 8.7* 8.9 8.8*  MG 2.8* 2.4 2.4 2.2 2.2  --   --   PHOS 7.6* 7.1* 6.8* 4.2 5.1*  --  3.5     Liver Function Tests: Recent Labs  Lab 03/04/22 0506 03/05/22 0602 03/06/22 0449 03/07/22 0423 03/08/22 0207  03/10/22 0906  AST 34 52* 52* 51* 44*  --   ALT 13 17 16 19 21   --   ALKPHOS 61 69 67 76 84  --   BILITOT 0.8 0.8 0.6 0.8 1.2  --   PROT 5.8* 5.9* 5.9* 6.4* 7.1  --   ALBUMIN 2.9* 2.8* 2.8* 2.9* 3.2* 3.5    No results for input(s): "LIPASE", "AMYLASE" in the last 168 hours. No results for input(s): "AMMONIA" in the last 168 hours.  CBC: Recent Labs  Lab 03/05/22 0602 03/06/22 0449 03/07/22 0423 03/08/22 0207 03/09/22 0357  WBC 4.3 4.5 4.5 5.4 5.5  HGB 12.5* 11.9* 12.5* 13.3 13.3  HCT 36.9* 35.2* 36.9* 39.7 39.4  MCV 79.2* 79.8* 79.7* 79.7* 79.1*  PLT 179 194 196 228 239     Cardiac Enzymes: No results for input(s): "CKTOTAL", "CKMB", "CKMBINDEX", "TROPONINI" in the last 168 hours.  BNP: Invalid input(s): "POCBNP"  CBG: Recent Labs  Lab 03/08/22 2107 03/09/22 0757 03/09/22 1141 03/10/22 0848 03/10/22 1352  GLUCAP 232* 154* 305* 208* 2*     Microbiology: Results for orders placed or performed during the hospital encounter of 03/02/22  Culture, blood (Routine X 2) w Reflex to ID Panel     Status: None   Collection Time: 03/02/22  2:15 PM   Specimen: BLOOD  Result Value Ref  Range Status   Specimen Description BLOOD LEFT ANTECUBITAL  Final   Special Requests   Final    BOTTLES DRAWN AEROBIC AND ANAEROBIC Blood Culture adequate volume   Culture   Final    NO GROWTH 5 DAYS Performed at [facility], [address] [place], [place] [address]    Report Status 03/07/2022 FINAL  Final  Culture, blood (Routine X 2) w Reflex to ID Panel     Status: None   Collection Time: 03/02/22  2:20 PM   Specimen: BLOOD  Result Value Ref Range Status   Specimen Description BLOOD RIGHT ANTECUBITAL  Final   Special Requests   Final    BOTTLES DRAWN AEROBIC AND ANAEROBIC Blood Culture adequate volume   Culture   Final    NO GROWTH 5 DAYS Performed at [facility], [address], [place], [place] [address]    Report Status 03/07/2022 FINAL  Final   Urine Culture (for pregnant, neutropenic or urologic patients or patients with an indwelling urinary catheter)     Status: Abnormal   Collection Time: 03/02/22  3:53 PM   Specimen: Urine, Clean Catch  Result Value Ref Range Status   Specimen Description   Final    URINE, CLEAN CATCH Performed at [facility], [address], [place], [place] [address]    Special Requests   Final    NONE Performed at [facility], [address] [place], [place] [address]    Culture (A)  Final    >=100,000 COLONIES/mL ENTEROCOCCUS FAECALIS >=100,000 COLONIES/mL LACTOBACILLUS SPECIES Standardized susceptibility testing for this organism is not available. Performed at [facility], [place] [address] [place], [place] [address]    Report Status 03/05/2022 FINAL  Final   Organism ID, Bacteria ENTEROCOCCUS FAECALIS (A)  Final      Susceptibility   Enterococcus faecalis - MIC*    AMPICILLIN <=2 SENSITIVE Sensitive     NITROFURANTOIN <=16 SENSITIVE Sensitive     VANCOMYCIN 1 SENSITIVE Sensitive     * >=100,000 COLONIES/mL ENTEROCOCCUS FAECALIS  MRSA Next Gen by PCR, Nasal     Status: None   Collection Time: 03/03/22  6:05 AM   Specimen: Nasal Mucosa; Nasal Swab  Result Value Ref Range Status   MRSA by PCR Next Gen NOT DETECTED NOT DETECTED Final    Comment: (NOTE) The GeneXpert MRSA Assay (FDA approved for NASAL specimens only), is one component of a comprehensive MRSA colonization surveillance program. It is not intended to diagnose MRSA infection nor to guide or monitor treatment for MRSA infections. Test performance is not FDA approved in patients less than 23 years old. Performed at [facility], [address] [place], [place] [address]     Coagulation Studies: No results for input(s): "LABPROT", "INR" in the last 72 hours.   Urinalysis: No results for input(s): "COLORURINE", "LABSPEC", "PHURINE", "GLUCOSEU", "HGBUR", "BILIRUBINUR", "KETONESUR",  "PROTEINUR", "UROBILINOGEN", "NITRITE", "LEUKOCYTESUR" in the last 72 hours.  Invalid input(s): "APPERANCEUR"     Imaging: PERIPHERAL VASCULAR CATHETERIZATION  Result Date: 03/10/2022 See surgical note for result.    Medications:    anticoagulant sodium citrate      acyclovir  200 mg Oral BID   amLODipine  10 mg Oral QPM   buprenorphine  1 patch Transdermal Weekly   Chlorhexidine Gluconate Cloth  6 each Topical Q0600   docusate sodium  100 mg Oral BID   heparin  5,000 Units Subcutaneous Q8H   hydrALAZINE  50  mg Oral TID   insulin aspart  0-5 Units Subcutaneous QHS   insulin aspart  0-9 Units Subcutaneous TID WC   isosorbide dinitrate  20 mg Oral TID   lidocaine  1 patch Transdermal Q24H   metoprolol tartrate  50 mg Oral BID   polyethylene glycol  17 g Oral BID   rosuvastatin  10 mg Oral QHS   sodium bicarbonate  1,300 mg Oral BID   albuterol, alteplase, anticoagulant sodium citrate, cyclobenzaprine, heparin, hydrALAZINE, lidocaine (PF), lidocaine-prilocaine, LORazepam, melatonin, nicotine, nitroGLYCERIN, ondansetron **OR** [DISCONTINUED] ondansetron (ZOFRAN) IV, pentafluoroprop-tetrafluoroeth, senna-docusate  Assessment/ Plan:  [name] is a 42 y.o.  male with past medical conditions including depression, HIV, hyperlipidemia, hypertension, BPH, diabetes who was admitted to [facility] on 03/02/2022 for Hyperkalemia [E87.5] Hypoglycemia [E16.2] Acute renal failure, unspecified acute renal failure type ([place]) [N17.9]   Acute kidney injury with proteinuria/hyperkalemia/hyponatremia.  Creatinine 5.05 on ED admission.  AKI appears multifactorial due to recent UTI infection, hypovolemia, and NSAID use.  NSAID use can cause glomular disease and FSGS, resulting in proteinuria.  Other differential includes HIV-associated nephropathy.  Patient also has underlying insulin-dependent diabetes.  Hemoglobin A1c 7.8% from 09/09/2021.   UPC ratio 3.95  on 03/04/2022.  Renal ultrasound  negative for obstruction. Serologic workup complements-C3-C4 normal.  ANA negative,  anca, NEG. kappa /Lambda ration mildly elevated.   Baseline creatinine of 0.78 on November 28, 2021. Dialysis received yesterday, UF 0.  It appears patient will require some dialysis at discharge.  Will consult vascular surgery for placement of PermCath.  Next dialysis treatment scheduled for Saturday, seated in chair.  Renal navigator currently seeking outpatient dialysis clinic. Patient currently prescribed Biktarvy, unsure if renal dosing is required for this medication. Will defer to ID.    Lab Results  Component Value Date   CREATININE 4.36 (H) 03/10/2022   CREATININE 6.41 (H) 03/09/2022   CREATININE 6.66 (H) 03/08/2022    Intake/Output Summary (Last 24 hours) at 03/10/2022 1409 Last data filed at 03/10/2022 0857 Gross per 24 hour  Intake --  Output 1625 ml  Net -1625 ml    2. Hypertension, essential. Home regimen includes amlodipine, clonidine, isosorbide, lisinopril and metoprolol.  Continue amlodipine, hydralazine, isosorbide dinitrate. Blood pressure remains elevated, 129/101.    LOS: [place] 2/2/20242:09 PM

## 2022-03-10 NOTE — Progress Notes (Signed)
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  Noted to have hyperkalemia and AKI and urinary tract infection.  Nephrology consulted workup underway as well as discussion to start dialysis.  Vascular surgery saw patient and placed temporary dialysis catheter on 1/27.  After several sessions of hemodialysis, plan is for renal biopsy which unable to do until 2/5 due to patient being on aspirin.  Showing some signs of improvement in creatinine, although still needs hemodialysis.  Vascular surgery taking for PermCath placement 2/2.   Assessment and Plan: * Hyperkalemia-resolved Secondary to worsening renal failure.  Status post Lokelma.  Metabolic acidosis Secondary to uremia  AKI (acute kidney injury) ([place]) in the setting of stage IV chronic kidney disease - Requiring dialysis.  Workup in progress, although suspecting NSAID use.  Waiting for renal biopsy after 5 days of aspirin clearance.  Getting intermittent dialysis.  Vascular surgery taking for PermCath on 2/2  UTI - Completed antibiotic course  HIV (human immunodeficiency virus infection) ([place]) - May need to change medication from [facility] if creatinine clearance does not improve.  Currently Biktarvy on hold  Essential hypertension Home medications resumed with additional medications added.  Blood pressure still elevated  Tobacco dependence - As needed nicotine patch ordered  Hyponatremia - Secondary to hypervolemia.  Sodium now normalized  Weakness - Multifactorial  Seizure disorder (HCC) - Ativan 2 mg IV as needed for seizure, 2 doses  ordered  Frequent falls - Fall precaution  Sleep apnea - CPAP nightly ordered  Insulin dependent type 2 diabetes mellitus (HCC) - Insulin SSI with at bedtime coverage ordered CBGs starting to trend upward, so increasing sliding scale from very sensitive to sensitive       Body mass index is 23.88 kg/m.        Consultants: Vascular surgery Interventional radiology Nephrology  Procedures: Placement of dialysis catheter PermCath placement 2/2 Renal biopsy possibly 2/5  Antimicrobials: Completed course of Rocephin/Augmentin for UTI  Code Status: Full code   Subjective: No complaints  Objective: Vital signs were reviewed and unremarkable. Vitals:   03/10/22 1330 03/10/22 1353  BP: (!) 129/101 (!) 135/100  Pulse: (!) 102 (!) 101  Resp: 12 14  Temp:  98.1 F (36.7 C)  SpO2: 98% 100%    Intake/Output Summary (Last 24 hours) at 03/10/2022 1527 Last data filed at 03/10/2022 1500 Gross per 24 hour  Intake 240 ml  Output 1050 ml  Net -810 ml    Filed Weights   03/08/22 0756 03/09/22 1559 03/09/22 1909  Weight: 67 kg 67.2 kg 67.1 kg   Body mass index is 23.88 kg/m.  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 Between 7PM-7AM, please contact night-coverage If you still have difficulty reaching the attending provider, please page the [facility] (Director on Call) for [facility] on amion for assistance.

## 2022-03-11 DIAGNOSIS — E119 Type 2 diabetes mellitus without complications: Secondary | ICD-10-CM | POA: Diagnosis not present

## 2022-03-11 DIAGNOSIS — B20 Human immunodeficiency virus [HIV] disease: Secondary | ICD-10-CM | POA: Diagnosis not present

## 2022-03-11 DIAGNOSIS — N179 Acute kidney failure, unspecified: Secondary | ICD-10-CM | POA: Diagnosis not present

## 2022-03-11 DIAGNOSIS — G40909 Epilepsy, unspecified, not intractable, without status epilepticus: Secondary | ICD-10-CM | POA: Diagnosis not present

## 2022-03-11 LAB — CBC
HCT: 38.2 % — ABNORMAL LOW (ref 39.0–52.0)
Hemoglobin: 12.7 g/dL — ABNORMAL LOW (ref 13.0–17.0)
MCH: 26.5 pg (ref 26.0–34.0)
MCHC: 33.2 g/dL (ref 30.0–36.0)
MCV: 79.7 fL — ABNORMAL LOW (ref 80.0–100.0)
Platelets: 340 10*3/uL (ref 150–400)
RBC: 4.79 MIL/uL (ref 4.22–5.81)
RDW: 15.6 % — ABNORMAL HIGH (ref 11.5–15.5)
WBC: 7.2 10*3/uL (ref 4.0–10.5)
nRBC: 0 % (ref 0.0–0.2)

## 2022-03-11 LAB — BASIC METABOLIC PANEL
Anion gap: 13 (ref 5–15)
BUN: 53 mg/dL — ABNORMAL HIGH (ref 6–20)
CO2: 23 mmol/L (ref 22–32)
Calcium: 9.1 mg/dL (ref 8.9–10.3)
Chloride: 99 mmol/L (ref 98–111)
Creatinine, Ser: 4.28 mg/dL — ABNORMAL HIGH (ref 0.61–1.24)
GFR, Estimated: 17 mL/min — ABNORMAL LOW (ref >60)
Glucose, Bld: 190 mg/dL — ABNORMAL HIGH (ref 70–99)
Potassium: 3.2 mmol/L — ABNORMAL LOW (ref 3.5–5.1)
Sodium: 135 mmol/L (ref 135–145)

## 2022-03-11 LAB — GLUCOSE, CAPILLARY
Glucose-Capillary: 213 mg/dL — ABNORMAL HIGH (ref 70–99)
Glucose-Capillary: 336 mg/dL — ABNORMAL HIGH (ref 70–99)
Glucose-Capillary: 339 mg/dL — ABNORMAL HIGH (ref 70–99)
Glucose-Capillary: 195 mg/dL — ABNORMAL HIGH (ref 70–99)

## 2022-03-11 MED ORDER — INSULIN GLARGINE-YFGN 100 UNIT/ML ~~LOC~~ SOLN
10.0000 [IU] | Freq: Every day | SUBCUTANEOUS | Status: DC
  Administered 2022-03-11 – 2022-03-12 (×2): 10 [IU] via SUBCUTANEOUS
  Filled 2022-03-11 (×2): qty 0.1

## 2022-03-11 MED ORDER — ACETAMINOPHEN 325 MG PO TABS
650.0000 mg | ORAL_TABLET | Freq: Four times a day (QID) | ORAL | Status: DC | PRN
  Administered 2022-03-11 – 2022-03-15 (×7): 650 mg via ORAL
  Filled 2022-03-11 (×7): qty 2

## 2022-03-11 NOTE — Progress Notes (Signed)
Post hd rn assessment 

## 2022-03-11 NOTE — Progress Notes (Signed)
Poor pull from arterial port of new L IJ catheter.  Lines reversed for treatment.

## 2022-03-11 NOTE — Progress Notes (Signed)
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  Noted to have hyperkalemia and AKI and urinary tract infection.  Nephrology consulted workup underway as well as discussion to start dialysis.  Vascular surgery saw patient and placed temporary dialysis catheter on 1/27.  After several sessions of hemodialysis, plan is for renal biopsy which unable to do until 2/5 due to patient being on aspirin.  Showing some signs of improvement in creatinine, although still needs hemodialysis.  Vascular surgery taking for PermCath placement 2/2.   Assessment and Plan: * Hyperkalemia-resolved Secondary to worsening renal failure.  Status post Lokelma.  Metabolic acidosis Secondary to uremia  AKI (acute kidney injury) ([place]) in the setting of stage IV chronic kidney disease - Requiring dialysis.  Workup in progress, although suspecting NSAID use.  Waiting for renal biopsy after 5 days of aspirin clearance.  Getting intermittent dialysis.  Vascular surgery placed PermCath on 2/2  UTI - Completed antibiotic course  HIV (human immunodeficiency virus infection) ([place]) - May need to change medication from [facility] if creatinine clearance does not improve.  Currently Biktarvy on hold  Essential hypertension Home medications resumed with additional medications added.  Blood pressure still elevated  Tobacco dependence - As needed nicotine patch ordered  Hyponatremia - Secondary to hypervolemia.  Sodium now normalized  Weakness - Multifactorial  Seizure disorder (HCC) - Ativan 2 mg IV as needed for seizure, 2 doses  ordered  Frequent falls - Fall precaution  Sleep apnea - CPAP nightly ordered  Insulin dependent type 2 diabetes mellitus (HCC) - Insulin SSI with at bedtime coverage ordered CBGs starting to trend upward, so increasing sliding scale from very sensitive to sensitive Have started long-acting insulin and will titrate upward       Body mass index is 24.37 kg/m.        Consultants: Vascular surgery Interventional radiology Nephrology  Procedures: Placement of dialysis catheter PermCath placement 2/2 Renal biopsy possibly 2/5  Antimicrobials: Completed course of Rocephin/Augmentin for UTI  Code Status: Full code   Subjective: No complaints  Objective: Vital signs were reviewed and unremarkable. Vitals:   03/11/22 0745 03/11/22 1139  BP: (!) 145/108 (!) 123/99  Pulse: 93 92  Resp: 17 17  Temp: 98.1 F (36.7 C) 97.8 F (36.6 C)  SpO2: 100% 99%    Intake/Output Summary (Last 24 hours) at 03/11/2022 1504 Last data filed at 03/11/2022 0920 Gross per 24 hour  Intake 240 ml  Output 1300 ml  Net -1060 ml    Filed Weights   03/09/22 1559 03/09/22 1909 03/11/22 0519  Weight: 67.2 kg 67.1 kg 68.5 kg   Body mass index is 24.37 kg/m.  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 Between 7PM-7AM, please contact night-coverage If you still have difficulty reaching the attending provider, please page the [facility] (Director on Call) for [facility] on amion for assistance.

## 2022-03-11 NOTE — Progress Notes (Signed)
Pt hoyered from bed to chair without incident.  Brought to HD by transport at this time

## 2022-03-11 NOTE — Progress Notes (Signed)
[facility]  ROUNDING NOTE   Subjective:   Patient seen sitting up in bed, eating breakfast Wife on speaker phone Patient reports soreness from newly placed permacath on left chest No other complaints to offer  Objective:  Vital signs in last 24 hours:  Temp:  [97.7 F (36.5 C)-98.7 F (37.1 C)] 97.8 F (36.6 C) (02/03 1139) Pulse Rate:  [92-121] 92 (02/03 1139) Resp:  [12-20] 17 (02/03 1139) BP: (107-145)/(58-108) 123/99 (02/03 1139) SpO2:  [96 %-100 %] 99 % (02/03 1139) FiO2 (%):  [21 %] 21 % (02/03 0505) Weight:  [68.5 kg] 68.5 kg (02/03 0519)  Weight change: 1.3 kg Filed Weights   03/09/22 1559 03/09/22 1909 03/11/22 0519  Weight: 67.2 kg 67.1 kg 68.5 kg    Intake/Output: I/O last 3 completed shifts: In: 560 [P.O.:360; IV Piggyback:200] Out: 1850 [Urine:1850]   Intake/Output this shift:  Total I/O In: 120 [P.O.:120] Out: 500 [Urine:500]  Physical Exam: General: NAD  Head: Normocephalic, atraumatic. Moist oral mucosal membranes  Eyes: Anicteric,  Lungs:  Clear to auscultation, normal effort  Heart: Regular rate and rhythm  Abdomen:  Soft, nontender  Extremities: No lower extremity edema  Neurologic: Alert and oriented unable to answer simple questions.   Skin: No lesions  Access: Right IJ nontunneled catheter.  Left chest PermCath placed on 04/10/1935    Basic Metabolic Panel: Recent Labs  Lab 03/05/22 0602 03/06/22 0449 03/07/22 0423 03/08/22 0207 03/09/22 0357 03/10/22 0906  NA 134* 139 137 136 135 135  K 4.5 4.5 3.6 3.7 3.3* 3.1*  CL 101 103 103 101 101 99  CO2 21* 20* 23 22 22 22   GLUCOSE 96 77 96 145* 193* 202*  BUN 51* 56* 32* 40* 53* 41*  CREATININE 6.29* 7.31* 5.56* 6.66* 6.41* 4.36*  CALCIUM 7.8* 7.8* 7.9* 8.7* 8.9 8.8*  MG 2.4 2.4 2.2 2.2  --   --   PHOS 7.1* 6.8* 4.2 5.1*  --  3.5     Liver Function Tests: Recent Labs  Lab 03/05/22 0602 03/06/22 0449 03/07/22 0423 03/08/22 0207 03/10/22 0906  AST 52* 52* 51*  44*  --   ALT 17 16 19 21   --   ALKPHOS 69 67 76 84  --   BILITOT 0.8 0.6 0.8 1.2  --   PROT 5.9* 5.9* 6.4* 7.1  --   ALBUMIN 2.8* 2.8* 2.9* 3.2* 3.5    No results for input(s): "LIPASE", "AMYLASE" in the last 168 hours. No results for input(s): "AMMONIA" in the last 168 hours.  CBC: Recent Labs  Lab 03/05/22 0602 03/06/22 0449 03/07/22 0423 03/08/22 0207 03/09/22 0357  WBC 4.3 4.5 4.5 5.4 5.5  HGB 12.5* 11.9* 12.5* 13.3 13.3  HCT 36.9* 35.2* 36.9* 39.7 39.4  MCV 79.2* 79.8* 79.7* 79.7* 79.1*  PLT 179 194 196 228 239     Cardiac Enzymes: No results for input(s): "CKTOTAL", "CKMB", "CKMBINDEX", "TROPONINI" in the last 168 hours.  BNP: Invalid input(s): "POCBNP"  CBG: Recent Labs  Lab 03/10/22 1612 03/10/22 2033 03/11/22 0744 03/11/22 1139 03/11/22 1218  GLUCAP 185* 315* 213* 339* 336*     Microbiology: Results for orders placed or performed during the hospital encounter of 03/02/22  Culture, blood (Routine X 2) w Reflex to ID Panel     Status: None   Collection Time: 03/02/22  2:15 PM   Specimen: BLOOD  Result Value Ref Range Status   Specimen Description BLOOD LEFT ANTECUBITAL  Final   Special Requests  Final    BOTTLES DRAWN AEROBIC AND ANAEROBIC Blood Culture adequate volume   Culture   Final    NO GROWTH 5 DAYS Performed at [facility], [address] [place], [place] [address]    Report Status 03/07/2022 FINAL  Final  Culture, blood (Routine X 2) w Reflex to ID Panel     Status: None   Collection Time: 03/02/22  2:20 PM   Specimen: BLOOD  Result Value Ref Range Status   Specimen Description BLOOD RIGHT ANTECUBITAL  Final   Special Requests   Final    BOTTLES DRAWN AEROBIC AND ANAEROBIC Blood Culture adequate volume   Culture   Final    NO GROWTH 5 DAYS Performed at [facility], [address], [place], [place] [address]    Report Status 03/07/2022 FINAL  Final  Urine Culture (for pregnant, neutropenic or urologic  patients or patients with an indwelling urinary catheter)     Status: Abnormal   Collection Time: 03/02/22  3:53 PM   Specimen: Urine, Clean Catch  Result Value Ref Range Status   Specimen Description   Final    URINE, CLEAN CATCH Performed at [facility], [address], [place], [place] [address]    Special Requests   Final    NONE Performed at [facility], [address], [place], [place] [address]    Culture (A)  Final    >=100,000 COLONIES/mL ENTEROCOCCUS FAECALIS >=100,000 COLONIES/mL LACTOBACILLUS SPECIES Standardized susceptibility testing for this organism is not available. Performed at [facility], [address] [place], [place] [address]    Report Status 03/05/2022 FINAL  Final   Organism ID, Bacteria ENTEROCOCCUS FAECALIS (A)  Final      Susceptibility   Enterococcus faecalis - MIC*    AMPICILLIN <=2 SENSITIVE Sensitive     NITROFURANTOIN <=16 SENSITIVE Sensitive     VANCOMYCIN 1 SENSITIVE Sensitive     * >=100,000 COLONIES/mL ENTEROCOCCUS FAECALIS  MRSA Next Gen by PCR, Nasal     Status: None   Collection Time: 03/03/22  6:05 AM   Specimen: Nasal Mucosa; Nasal Swab  Result Value Ref Range Status   MRSA by PCR Next Gen NOT DETECTED NOT DETECTED Final    Comment: (NOTE) The GeneXpert MRSA Assay (FDA approved for NASAL specimens only), is one component of a comprehensive MRSA colonization surveillance program. It is not intended to diagnose MRSA infection nor to guide or monitor treatment for MRSA infections. Test performance is not FDA approved in patients less than 47 years old. Performed at [facility], [address] [place],  [address]     Coagulation Studies: No results for input(s): "LABPROT", "INR" in the last 72 hours.   Urinalysis: No results for input(s): "COLORURINE", "LABSPEC", "PHURINE", "GLUCOSEU", "HGBUR", "BILIRUBINUR", "KETONESUR", "PROTEINUR", "UROBILINOGEN", "NITRITE", "LEUKOCYTESUR" in the  last 72 hours.  Invalid input(s): "APPERANCEUR"     Imaging: PERIPHERAL VASCULAR CATHETERIZATION  Result Date: 03/10/2022 See surgical note for result.    Medications:    anticoagulant sodium citrate      acyclovir  200 mg Oral BID   amLODipine  10 mg Oral QPM   buprenorphine  1 patch Transdermal Weekly   Chlorhexidine Gluconate Cloth  6 each Topical Q0600   docusate sodium  100 mg Oral BID   heparin  5,000 Units Subcutaneous Q8H   hydrALAZINE  50 mg Oral TID   insulin aspart  0-5 Units Subcutaneous QHS   insulin aspart  0-9 Units Subcutaneous TID WC   isosorbide dinitrate  20 mg Oral TID   lidocaine  1 patch Transdermal Q24H   metoprolol tartrate  50 mg Oral BID   polyethylene glycol  17 g Oral BID   rosuvastatin  10 mg Oral QHS   sodium bicarbonate  1,300 mg Oral BID   acetaminophen, albuterol, alteplase, anticoagulant sodium citrate, cyclobenzaprine, heparin, hydrALAZINE, lidocaine (PF), lidocaine-prilocaine, LORazepam, melatonin, nicotine, nitroGLYCERIN, ondansetron **OR** [DISCONTINUED] ondansetron (ZOFRAN) IV, pentafluoroprop-tetrafluoroeth, senna-docusate  Assessment/ Plan:  [name] is a 42 y.o.  male with past medical conditions including depression, HIV, hyperlipidemia, hypertension, BPH, diabetes who was admitted to [facility] on 03/02/2022 for Hyperkalemia [E87.5] Hypoglycemia [E16.2] Acute renal failure, unspecified acute renal failure type ([place]) [N17.9]   Acute kidney injury with proteinuria/hyperkalemia/hyponatremia.  Creatinine 5.05 on ED admission.  AKI appears multifactorial due to recent UTI infection, hypovolemia, and NSAID use.  NSAID use can cause glomular disease and FSGS, resulting in proteinuria.  Other differential includes HIV-associated nephropathy.  Patient also has underlying insulin-dependent diabetes.  Hemoglobin A1c 7.8% from 09/09/2021.   UPC ratio 3.95  on 03/04/2022.  Renal ultrasound negative for obstruction. Serologic workup  complements-C3-C4 normal.  ANA negative,  anca, NEG. kappa /Lambda ration mildly elevated.   Baseline creatinine of 0.78 on November 28, 2021. Appreciate vascular placing left chest PermCath yesterday.  Patient scheduled to receive dialysis today.  If PermCath use successful, will place order to discontinue right IJ temp cath.  Renal navigator aware of patient and currently seeking outpatient dialysis clinic.  Next treatment scheduled for Monday.   Lab Results  Component Value Date   CREATININE 4.36 (H) 03/10/2022   CREATININE 6.41 (H) 03/09/2022   CREATININE 6.66 (H) 03/08/2022    Intake/Output Summary (Last 24 hours) at 03/11/2022 1314 Last data filed at 03/11/2022 0920 Gross per 24 hour  Intake 680 ml  Output 1300 ml  Net -620 ml    2. Hypertension, essential. Home regimen includes amlodipine, clonidine, isosorbide, lisinopril and metoprolol.  Continue amlodipine, hydralazine, isosorbide dinitrate. Blood pressure acceptable.    LOS: [place] 2/3/20241:14 PM

## 2022-03-11 NOTE — Progress Notes (Signed)
Tolerated tx well.  Ran for 3.5hrs with no fluid removal    03/11/22 1926  Vitals  Temp 98.3 F (36.8 C)  Temp Source Oral  BP (!) 135/100  MAP (mmHg) 111  BP Location Right Arm  BP Method Automatic  Patient Position (if appropriate) Lying  Pulse Rate (!) 116  Pulse Rate Source Monitor  ECG Heart Rate (!) 116  Resp 18  Oxygen Therapy  SpO2 100 %  O2 Device Room Air  Patient Activity (if Appropriate) In chair  Pulse Oximetry Type Continuous  During Treatment Monitoring  HD Safety Checks Performed Yes  Intra-Hemodialysis Comments Tolerated well;Tx completed  Post Treatment  Dialyzer Clearance Lightly streaked  Duration of HD Treatment -hour(s) 3.5 hour(s)  Hemodialysis Intake (mL) 0 mL  Liters Processed 84  Fluid Removed (mL) 0 mL  Tolerated HD Treatment Yes  Post-Hemodialysis Comments HD complete, goal met, pt alert, no c/o, stable, report to primary rn

## 2022-03-12 DIAGNOSIS — N179 Acute kidney failure, unspecified: Secondary | ICD-10-CM | POA: Diagnosis not present

## 2022-03-12 DIAGNOSIS — G40909 Epilepsy, unspecified, not intractable, without status epilepticus: Secondary | ICD-10-CM | POA: Diagnosis not present

## 2022-03-12 DIAGNOSIS — B20 Human immunodeficiency virus [HIV] disease: Secondary | ICD-10-CM | POA: Diagnosis not present

## 2022-03-12 DIAGNOSIS — E119 Type 2 diabetes mellitus without complications: Secondary | ICD-10-CM | POA: Diagnosis not present

## 2022-03-12 LAB — BASIC METABOLIC PANEL
Anion gap: 10 (ref 5–15)
BUN: 33 mg/dL — ABNORMAL HIGH (ref 6–20)
CO2: 29 mmol/L (ref 22–32)
Calcium: 9.1 mg/dL (ref 8.9–10.3)
Chloride: 99 mmol/L (ref 98–111)
Creatinine, Ser: 2.73 mg/dL — ABNORMAL HIGH (ref 0.61–1.24)
GFR, Estimated: 29 mL/min — ABNORMAL LOW (ref >60)
Glucose, Bld: 221 mg/dL — ABNORMAL HIGH (ref 70–99)
Potassium: 3.3 mmol/L — ABNORMAL LOW (ref 3.5–5.1)
Sodium: 138 mmol/L (ref 135–145)

## 2022-03-12 LAB — CBC
HCT: 39.5 % (ref 39.0–52.0)
Hemoglobin: 13.1 g/dL (ref 13.0–17.0)
MCH: 26.7 pg (ref 26.0–34.0)
MCHC: 33.2 g/dL (ref 30.0–36.0)
MCV: 80.4 fL (ref 80.0–100.0)
Platelets: 335 10*3/uL (ref 150–400)
RBC: 4.91 MIL/uL (ref 4.22–5.81)
RDW: 15.6 % — ABNORMAL HIGH (ref 11.5–15.5)
WBC: 7.8 10*3/uL (ref 4.0–10.5)
nRBC: 0 % (ref 0.0–0.2)

## 2022-03-12 LAB — GLUCOSE, CAPILLARY
Glucose-Capillary: 200 mg/dL — ABNORMAL HIGH (ref 70–99)
Glucose-Capillary: 433 mg/dL — ABNORMAL HIGH (ref 70–99)
Glucose-Capillary: 171 mg/dL — ABNORMAL HIGH (ref 70–99)
Glucose-Capillary: 165 mg/dL — ABNORMAL HIGH (ref 70–99)

## 2022-03-12 MED ORDER — INSULIN ASPART 100 UNIT/ML IJ SOLN
10.0000 [IU] | Freq: Once | INTRAMUSCULAR | Status: AC
  Administered 2022-03-12: 10 [IU] via SUBCUTANEOUS
  Filled 2022-03-12: qty 1

## 2022-03-12 MED ORDER — INSULIN GLARGINE-YFGN 100 UNIT/ML ~~LOC~~ SOLN
20.0000 [IU] | Freq: Every day | SUBCUTANEOUS | Status: DC
  Administered 2022-03-13: 20 [IU] via SUBCUTANEOUS
  Filled 2022-03-12: qty 0.2

## 2022-03-12 MED ORDER — METOPROLOL TARTRATE 50 MG PO TABS
75.0000 mg | ORAL_TABLET | Freq: Two times a day (BID) | ORAL | Status: DC
  Administered 2022-03-12 – 2022-03-16 (×9): 75 mg via ORAL
  Filled 2022-03-12 (×9): qty 1

## 2022-03-12 MED ORDER — INSULIN GLARGINE-YFGN 100 UNIT/ML ~~LOC~~ SOLN
10.0000 [IU] | SUBCUTANEOUS | Status: AC
  Administered 2022-03-12: 10 [IU] via SUBCUTANEOUS
  Filled 2022-03-12 (×2): qty 0.1

## 2022-03-12 NOTE — Progress Notes (Signed)
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  Noted to have hyperkalemia and AKI and urinary tract infection.  Nephrology consulted workup underway as well as discussion to start dialysis.  Vascular surgery saw patient and placed temporary dialysis catheter on 1/27.  After several sessions of hemodialysis, plan is for renal biopsy which unable to do until 2/5 due to patient being on aspirin.  Showing some signs of improvement in creatinine, although still needs hemodialysis.  Vascular surgery taking for PermCath placement 2/2.   Assessment and Plan: * Hyperkalemia-resolved Secondary to worsening renal failure.  Status post Lokelma.  Metabolic acidosis Secondary to uremia  AKI (acute kidney injury) ([employer]) in the setting of stage IV chronic kidney disease - Requiring dialysis.  Workup in progress, although suspecting NSAID use.  Waiting for renal biopsy after 5 days of aspirin clearance.  Getting intermittent dialysis.  Vascular surgery placed PermCath on 2/2  UTI - Completed antibiotic course  HIV (human immunodeficiency virus infection) ([place]) - May need to change medication from [facility] if creatinine clearance does not improve.  Currently Biktarvy on hold  Essential hypertension Home medications resumed with additional medications added.  Blood pressure still elevated  Tobacco dependence - As needed nicotine patch ordered  Hyponatremia - Secondary to hypervolemia.  Sodium now normalized  Weakness - Multifactorial  Seizure disorder (HCC) - Ativan 2 mg IV as needed for seizure, 2 doses  ordered  Frequent falls - Fall precaution  Sleep apnea - CPAP nightly ordered  Insulin dependent type 2 diabetes mellitus (HCC) - Insulin SSI with at bedtime coverage ordered CBGs continuing to trend upward, so continue to increase long-acting insulin dose.  Still nowhere near patient's home dose.  Also with sliding scale.   Body mass index is 24.37 kg/m.        Consultants: Vascular surgery Interventional radiology Nephrology  Procedures: Placement of dialysis catheter PermCath placement 2/2 Renal biopsy possibly 2/5  Antimicrobials: Completed course of Rocephin/Augmentin for UTI  Code Status: Full code   Subjective: No complaints  Objective: Vital signs were reviewed and unremarkable. Vitals:   03/12/22 0753 03/12/22 1214  BP: (!) 139/94 (!) 123/95  Pulse: (!) 110 92  Resp: 16 16  Temp: 98.9 F (37.2 C) 98.7 F (37.1 C)  SpO2: 100% 100%    Intake/Output Summary (Last 24 hours) at 03/12/2022 1332 Last data filed at 03/12/2022 0900 Gross per 24 hour  Intake 360 ml  Output 700 ml  Net -340 ml    Filed Weights   03/09/22 1559 03/09/22 1909 03/11/22 0519  Weight: 67.2 kg 67.1 kg 68.5 kg   Body mass index is 24.37 kg/m.  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 Between 7PM-7AM, please contact night-coverage If you still have difficulty reaching the attending provider, please page the [facility] (Director on Call) for [facility] on amion for assistance.

## 2022-03-12 NOTE — Progress Notes (Signed)
[facility]  ROUNDING NOTE   Subjective:   Patient seen sitting up in bed Preparing to eat breakfast Remains on room air No lower extremity edema Appetite has improved  Objective:  Vital signs in last 24 hours:  Temp:  [97.8 F (36.6 C)-99.3 F (37.4 C)] 98.9 F (37.2 C) (02/04 0753) Pulse Rate:  [90-117] 110 (02/04 0753) Resp:  [11-20] 16 (02/04 0753) BP: (110-139)/(80-107) 139/94 (02/04 0753) SpO2:  [99 %-100 %] 100 % (02/04 0753) FiO2 (%):  [21 %] 21 % (02/03 2305)  Weight change:  Filed Weights   03/09/22 1559 03/09/22 1909 03/11/22 0519  Weight: 67.2 kg 67.1 kg 68.5 kg    Intake/Output: I/O last 3 completed shifts: In: 240 [P.O.:240] Out: 1400 [Urine:1400]   Intake/Output this shift:  Total I/O In: 240 [P.O.:240] Out: 300 [Urine:300]  Physical Exam: General: NAD  Head: Normocephalic, atraumatic. Moist oral mucosal membranes  Eyes: Anicteric,  Lungs:  Clear to auscultation, normal effort  Heart: Regular rate and rhythm  Abdomen:  Soft, nontender  Extremities: No lower extremity edema  Neurologic: Alert and oriented unable to answer simple questions.   Skin: No lesions  Access: Right IJ nontunneled catheter.  Left chest PermCath placed on 09/10/4625    Basic Metabolic Panel: Recent Labs  Lab 03/06/22 0449 03/07/22 0423 03/08/22 0207 03/09/22 0357 03/10/22 0906 03/11/22 1521 03/12/22 0625  NA 139 137 136 135 135 135 138  K 4.5 3.6 3.7 3.3* 3.1* 3.2* 3.3*  CL 103 103 101 101 99 99 99  CO2 20* 23 22 22 22 23 29   GLUCOSE 77 96 145* 193* 202* 190* 221*  BUN 56* 32* 40* 53* 41* 53* 33*  CREATININE 7.31* 5.56* 6.66* 6.41* 4.36* 4.28* 2.73*  CALCIUM 7.8* 7.9* 8.7* 8.9 8.8* 9.1 9.1  MG 2.4 2.2 2.2  --   --   --   --   PHOS 6.8* 4.2 5.1*  --  3.5  --   --      Liver Function Tests: Recent Labs  Lab 03/06/22 0449 03/07/22 0423 03/08/22 0207 03/10/22 0906  AST 52* 51* 44*  --   ALT 16 19 21   --   ALKPHOS 67 76 84  --   BILITOT  0.6 0.8 1.2  --   PROT 5.9* 6.4* 7.1  --   ALBUMIN 2.8* 2.9* 3.2* 3.5    No results for input(s): "LIPASE", "AMYLASE" in the last 168 hours. No results for input(s): "AMMONIA" in the last 168 hours.  CBC: Recent Labs  Lab 03/07/22 0423 03/08/22 0207 03/09/22 0357 03/11/22 1521 03/12/22 0625  WBC 4.5 5.4 5.5 7.2 7.8  HGB 12.5* 13.3 13.3 12.7* 13.1  HCT 36.9* 39.7 39.4 38.2* 39.5  MCV 79.7* 79.7* 79.1* 79.7* 80.4  PLT 196 228 239 340 335     Cardiac Enzymes: No results for input(s): "CKTOTAL", "CKMB", "CKMBINDEX", "TROPONINI" in the last 168 hours.  BNP: Invalid input(s): "POCBNP"  CBG: Recent Labs  Lab 03/11/22 0744 03/11/22 1139 03/11/22 1218 03/11/22 2046 03/12/22 0755  GLUCAP 213* 339* 336* 195* 200*     Microbiology: Results for orders placed or performed during the hospital encounter of 03/02/22  Culture, blood (Routine X 2) w Reflex to ID Panel     Status: None   Collection Time: 03/02/22  2:15 PM   Specimen: BLOOD  Result Value Ref Range Status   Specimen Description BLOOD LEFT ANTECUBITAL  Final   Special Requests   Final  BOTTLES DRAWN AEROBIC AND ANAEROBIC Blood Culture adequate volume   Culture   Final    NO GROWTH 5 DAYS Performed at [facility], [address] [place], [place] [address]    Report Status 03/07/2022 FINAL  Final  Culture, blood (Routine X 2) w Reflex to ID Panel     Status: None   Collection Time: 03/02/22  2:20 PM   Specimen: BLOOD  Result Value Ref Range Status   Specimen Description BLOOD RIGHT ANTECUBITAL  Final   Special Requests   Final    BOTTLES DRAWN AEROBIC AND ANAEROBIC Blood Culture adequate volume   Culture   Final    NO GROWTH 5 DAYS Performed at [facility], [address], [place], [place] [address]    Report Status 03/07/2022 FINAL  Final  Urine Culture (for pregnant, neutropenic or urologic patients or patients with an indwelling urinary catheter)     Status: Abnormal    Collection Time: 03/02/22  3:53 PM   Specimen: Urine, Clean Catch  Result Value Ref Range Status   Specimen Description   Final    URINE, CLEAN CATCH Performed at [facility], [address] [place], [place] [address]    Special Requests   Final    NONE Performed at [facility], [address] [place], [place] [address]    Culture (A)  Final    >=100,000 COLONIES/mL ENTEROCOCCUS FAECALIS >=100,000 COLONIES/mL LACTOBACILLUS SPECIES Standardized susceptibility testing for this organism is not available. Performed at [facility], [place] [address], [place], [place] [address]    Report Status 03/05/2022 FINAL  Final   Organism ID, Bacteria ENTEROCOCCUS FAECALIS (A)  Final      Susceptibility   Enterococcus faecalis - MIC*    AMPICILLIN <=2 SENSITIVE Sensitive     NITROFURANTOIN <=16 SENSITIVE Sensitive     VANCOMYCIN 1 SENSITIVE Sensitive     * >=100,000 COLONIES/mL ENTEROCOCCUS FAECALIS  MRSA Next Gen by PCR, Nasal     Status: None   Collection Time: 03/03/22  6:05 AM   Specimen: Nasal Mucosa; Nasal Swab  Result Value Ref Range Status   MRSA by PCR Next Gen NOT DETECTED NOT DETECTED Final    Comment: (NOTE) The GeneXpert MRSA Assay (FDA approved for NASAL specimens only), is one component of a comprehensive MRSA colonization surveillance program. It is not intended to diagnose MRSA infection nor to guide or monitor treatment for MRSA infections. Test performance is not FDA approved in patients less than 36 years old. Performed at [facility], [place] [place], [place] [address]     Coagulation Studies: No results for input(s): "LABPROT", "INR" in the last 72 hours.   Urinalysis: No results for input(s): "COLORURINE", "LABSPEC", "PHURINE", "GLUCOSEU", "HGBUR", "BILIRUBINUR", "KETONESUR", "PROTEINUR", "UROBILINOGEN", "NITRITE", "LEUKOCYTESUR" in the last 72 hours.  Invalid input(s): "APPERANCEUR"     Imaging: PERIPHERAL  VASCULAR CATHETERIZATION  Result Date: 03/10/2022 See surgical note for result.    Medications:    anticoagulant sodium citrate      acyclovir  200 mg Oral BID   amLODipine  10 mg Oral QPM   buprenorphine  1 patch Transdermal Weekly   Chlorhexidine Gluconate Cloth  6 each Topical Q0600   docusate sodium  100 mg Oral BID   heparin  5,000 Units Subcutaneous Q8H   hydrALAZINE  50 mg Oral TID   insulin aspart  0-5 Units Subcutaneous QHS   insulin aspart  0-9 Units Subcutaneous TID  WC   insulin glargine-yfgn  10 Units Subcutaneous Daily   isosorbide dinitrate  20 mg Oral TID   lidocaine  1 patch Transdermal Q24H   metoprolol tartrate  75 mg Oral BID   polyethylene glycol  17 g Oral BID   rosuvastatin  10 mg Oral QHS   sodium bicarbonate  1,300 mg Oral BID   acetaminophen, albuterol, alteplase, anticoagulant sodium citrate, cyclobenzaprine, heparin, hydrALAZINE, lidocaine (PF), lidocaine-prilocaine, LORazepam, melatonin, nicotine, nitroGLYCERIN, ondansetron **OR** [DISCONTINUED] ondansetron (ZOFRAN) IV, pentafluoroprop-tetrafluoroeth, senna-docusate  Assessment/ Plan:  [name] is a 42 y.o.  male with past medical conditions including depression, HIV, hyperlipidemia, hypertension, BPH, diabetes who was admitted to [facility] on 03/02/2022 for Hyperkalemia [E87.5] Hypoglycemia [E16.2] Acute renal failure, unspecified acute renal failure type ([place]) [N17.9]   Acute kidney injury with proteinuria/hyperkalemia/hyponatremia.  Creatinine 5.05 on ED admission.  AKI appears multifactorial due to recent UTI infection, hypovolemia, and NSAID use.  NSAID use can cause glomular disease and FSGS, resulting in proteinuria.  Other differential includes HIV-associated nephropathy.  Patient also has underlying insulin-dependent diabetes.  Hemoglobin A1c 7.8% from 09/09/2021.   UPC ratio 3.95  on 03/04/2022.  Renal ultrasound negative for obstruction. Serologic workup complements-C3-C4 normal.  ANA  negative,  anca, NEG. kappa /Lambda ration mildly elevated.   Baseline creatinine of 0.78 on November 28, 2021. Dialysis tolerated well with new Permcath. No UF due to adequate UOP. Will place order for HD temp cath tp be removed. Will proceed with renal biopsy tomorrow to determine cause and treatment options for kidney failure. Order placed for NPO after midnight and am PT/INR. Will plan for next dialysis treatment on Tuesday.    Lab Results  Component Value Date   CREATININE 2.73 (H) 03/12/2022   CREATININE 4.28 (H) 03/11/2022   CREATININE 4.36 (H) 03/10/2022    Intake/Output Summary (Last 24 hours) at 03/12/2022 1100 Last data filed at 03/12/2022 0900 Gross per 24 hour  Intake 360 ml  Output 700 ml  Net -340 ml    2. Hypertension, essential. Home regimen includes amlodipine, clonidine, isosorbide, lisinopril and metoprolol.  Continue amlodipine, hydralazine, isosorbide dinitrate.    LOS: [place] 2/4/202411:00 AM

## 2022-03-13 ENCOUNTER — Encounter: Payer: Self-pay | Admitting: Internal Medicine

## 2022-03-13 DIAGNOSIS — E875 Hyperkalemia: Secondary | ICD-10-CM | POA: Diagnosis not present

## 2022-03-13 DIAGNOSIS — E871 Hypo-osmolality and hyponatremia: Secondary | ICD-10-CM | POA: Diagnosis not present

## 2022-03-13 DIAGNOSIS — N179 Acute kidney failure, unspecified: Secondary | ICD-10-CM | POA: Diagnosis not present

## 2022-03-13 DIAGNOSIS — B20 Human immunodeficiency virus [HIV] disease: Secondary | ICD-10-CM | POA: Diagnosis not present

## 2022-03-13 LAB — GLUCOSE, CAPILLARY
Glucose-Capillary: 187 mg/dL — ABNORMAL HIGH (ref 70–99)
Glucose-Capillary: 203 mg/dL — ABNORMAL HIGH (ref 70–99)
Glucose-Capillary: 236 mg/dL — ABNORMAL HIGH (ref 70–99)
Glucose-Capillary: 195 mg/dL — ABNORMAL HIGH (ref 70–99)

## 2022-03-13 LAB — PROTIME-INR
INR: 1.1 (ref 0.8–1.2)
Prothrombin Time: 13.8 seconds (ref 11.4–15.2)

## 2022-03-13 MED ORDER — INSULIN GLARGINE-YFGN 100 UNIT/ML ~~LOC~~ SOLN
22.0000 [IU] | Freq: Every day | SUBCUTANEOUS | Status: DC
  Administered 2022-03-14 – 2022-03-15 (×2): 22 [IU] via SUBCUTANEOUS
  Filled 2022-03-13 (×3): qty 0.22

## 2022-03-13 MED ORDER — HEPARIN SODIUM (PORCINE) 5000 UNIT/ML IJ SOLN
5000.0000 [IU] | Freq: Three times a day (TID) | INTRAMUSCULAR | Status: AC
  Administered 2022-03-13 (×2): 5000 [IU] via SUBCUTANEOUS
  Filled 2022-03-13 (×2): qty 1

## 2022-03-13 NOTE — Progress Notes (Signed)
[facility] Progress Note  Patient: Cory Campbell    ZJI:[id number]  DOA: 03/02/2022    Date of Service: the patient was seen and examined on 03/13/2022  Brief hospital course: Mr. 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  Noted to have hyperkalemia and AKI and urinary tract infection.  Nephrology consulted workup underway as well as discussion to start dialysis.  Vascular surgery saw patient and placed temporary dialysis catheter on 1/27.  After several sessions of hemodialysis, patient showing some signs of improvement in creatinine, although still needs hemodialysis.  Vascular surgery placed PermCath placement 2/2.  Interventional radiology plans to take patient on 2/6 for renal biopsy.   Assessment and Plan: * Hyperkalemia-resolved Secondary to worsening renal failure.  Status post Lokelma.  Metabolic acidosis Secondary to uremia  AKI (acute kidney injury) ([place]) in the setting of stage IV chronic kidney disease - Requiring dialysis.  Workup in progress, although suspecting NSAID use.  Getting intermittent dialysis.  Vascular surgery placed PermCath on 2/2.  Patient being set up with outpatient dialysis for now.  Renal biopsy initially delayed due to patient being on aspirin, but now following 5 days of clearance, scheduled for biopsy by interventional radiology on 2/6.  UTI - Completed antibiotic course  HIV (human immunodeficiency virus infection) ([place]) - May need to change medication from [facility] if creatinine clearance does not improve.  Currently Biktarvy on hold  Essential hypertension Home medications resumed with additional medications added.  Blood pressure still elevated  Tobacco dependence - As needed nicotine patch ordered  Hyponatremia - Secondary to  hypervolemia.  Sodium now normalized  Weakness - Multifactorial  Seizure disorder (HCC) - Ativan 2 mg IV as needed for seizure, 2 doses ordered  Frequent falls - Fall precaution  Sleep apnea - CPAP nightly ordered  Insulin dependent type 2 diabetes mellitus (HCC) - Insulin SSI with at bedtime coverage ordered CBGs continuing to trend upward, so continue to titrate up long-acting insulin dose, but still nowhere near patient's home dose.  Also with sliding scale.   Body mass index is 24.37 kg/m.        Consultants: Vascular surgery Interventional radiology Nephrology  Procedures: Placement of dialysis catheter PermCath placement 2/2 Renal biopsy planned 2/6  Antimicrobials: Completed course of Rocephin/Augmentin for UTI  Code Status: Full code   Subjective: Patient with no complaints Objective: Vital signs were reviewed and unremarkable. Vitals:   03/13/22 0303 03/13/22 0904  BP: (!) 121/90 (!) 141/102  Pulse: 94 96  Resp: 16 18  Temp: 99.1 F (37.3 C) 98.2 F (36.8 C)  SpO2: 100% 100%    Intake/Output Summary (Last 24 hours) at 03/13/2022 1142 Last data filed at 03/13/2022 0908 Gross per 24 hour  Intake --  Output 1500 ml  Net -1500 ml   Filed Weights   03/09/22 1559 03/09/22 1909 03/11/22 0519  Weight: 67.2 kg 67.1 kg 68.5 kg   Body mass index is 24.37 kg/m.  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 Between 7PM-7AM, please contact night-coverage If you still have difficulty reaching the attending provider, please page the [facility] (Director on Call) for [facility] on amion for assistance.

## 2022-03-13 NOTE — Consult Note (Addendum)
Chief Complaint: Patient was seen in consultation today for AKI in the setting of stage IV CKD at the request of Wendee Beavers, NP  Referring Physician(s): Wendee Beavers, NP  Supervising Physician: Pernell Dupre  Patient Status: [facility] - In-pt  History of Present Illness: Cory Campbell is a 42 y.o. male with PMH significant for arthrogryposis multiplex congenita, seizure disorder, OSA, CKD stage IV and HIV on Biktarvy presented to ED 03/02/2022 with hypoglycemia and hypotension. ED workup showed hyperkalemia and AKI with UTI.  He was seen by nephrology, had PermCath placed 03/10/2022 and has undergone several sessions of hemodialysis. Patient has been referred to IR for random renal biopsy.  Last ASA dose was 03/07/2022.  Pt denies fever, chills, CP, SOB, abd pain, emesis or appetite change.  He endorses nausea and chronic back pain.  He understands he will be NPO after MN.   Past Medical History:  Diagnosis Date   (HFpEF) heart failure with preserved ejection fraction (HCC)    Acid reflux    Adopted    Anxiety    Arthritis    Arthrogryposis    Asthma    BPH (benign prostatic hyperplasia)    Bronchitis    Chronic pain syndrome    Depressive disorder, not elsewhere classified    Diabetes mellitus without complication (HCC)    Diverticulitis of colon (without mention of hemorrhage)(562.11)    ED (erectile dysfunction)    Herpes genitalis    HIV infection (HCC)    HTN (hypertension)    Hyperlipidemia    Hypertensive cardiomyopathy (HCC)    Hypogonadism in male    OAB (overactive bladder)    Other psoriasis    Seizure disorder (HCC)    Sleep apnea    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 or other cause, without mention of thyrotoxic crisis or storm    hyperthyroidism    Past Surgical History:  Procedure Laterality Date   APPENDECTOMY     COLOSTOMY     CORNEAL TRANSPLANT     DIALYSIS/PERMA CATHETER INSERTION Right 03/10/2022   Procedure: DIALYSIS/PERMA  CATHETER INSERTION;  Surgeon: Renford Dills, MD;  Location: [facility] INVASIVE CV LAB;  Service: Cardiovascular;  Laterality: Right;   feet surgery     both   HAND SURGERY     left and right   leg surgery     left and right   ORTHOPEDIC SURGERY     hands, feet, knees, legs   tubes in ears     both    Allergies: Penicillins and Erythromycin base  Medications: Prior to Admission medications   Medication Sig Start Date End Date Taking? Authorizing Provider  acyclovir (ZOVIRAX) 400 MG tablet Take 1 tablet (400 mg total) by mouth 3 (three) times daily. 02/12/22 03/14/22 Yes White, Adrienne R, NP  albuterol (PROVENTIL HFA;VENTOLIN HFA) 108 (90 BASE) MCG/ACT inhaler Inhale 2 puffs into the lungs every 4 (four) hours as needed for wheezing or shortness of breath.    Yes [provider]  albuterol (PROVENTIL) (2.5 MG/3ML) 0.083% nebulizer solution Take 2.5 mg by nebulization every 4 (four) hours as needed for wheezing or shortness of breath.   Yes [provider]  amLODipine (NORVASC) 10 MG tablet Take 10 mg by mouth daily.   Yes [provider]  aspirin EC 81 MG tablet Take 81 mg by mouth daily. Swallow whole.   Yes [provider]  BIKTARVY 50-200-25 MG TABS tablet Take 1 tablet by mouth daily. 11/21/16  Yes  [provider]  buprenorphine (BUTRANS) 7.5 MCG/HR Place 7.5 mg onto the skin once a week. Saturday   Yes [provider]  buPROPion (WELLBUTRIN XL) 150 MG 24 hr tablet Take 150 mg by mouth daily. 08/11/21  Yes [provider]  cetirizine (ZYRTEC) 10 MG tablet Take 10 mg by mouth daily.   Yes [provider]  chlorthalidone (HYGROTON) 25 MG tablet Take 25 mg by mouth daily.   Yes [provider]  citalopram (CELEXA) 20 MG tablet Take 20 mg by mouth daily.   Yes [provider]  cloNIDine (CATAPRES - DOSED IN MG/24 HR) 0.3 mg/24hr patch 0.3 mg once a week. 06/13/21  Yes [provider]  diclofenac  sodium (VOLTAREN) 1 % GEL Apply 2-4 g topically 4 (four) times daily as needed. For pain.   Yes [provider]  dicyclomine (BENTYL) 10 MG capsule Take 20 mg by mouth 2 (two) times daily before a meal. 05/01/21  Yes [provider]  empagliflozin (JARDIANCE) 25 MG TABS tablet Take 25 mg by mouth daily. 01/19/20  Yes [provider]  famotidine (PEPCID) 20 MG tablet Take 20 mg by mouth daily.  01/03/16  Yes [provider]  ferrous sulfate 325 (65 FE) MG tablet Take 325 mg by mouth daily with breakfast.   Yes [provider]  fluticasone (FLONASE) 50 MCG/ACT nasal spray Place 2 sprays into both nostrils daily.   Yes [provider]  fluticasone (FLOVENT HFA) 110 MCG/ACT inhaler Inhale 2 puffs into the lungs 2 (two) times daily.   Yes [provider]  gabapentin (NEURONTIN) 300 MG capsule Take 1 capsule (300 mg total) by mouth 2 (two) times daily. Patient taking differently: Take 300 mg by mouth 3 (three) times daily. 09/12/21 03/02/22 Yes Debbe Odea, MD  glucose 4 GM chewable tablet Chew 1 tablet by mouth as needed for low blood sugar.   Yes [provider]  insulin degludec (TRESIBA) 100 UNIT/ML FlexTouch Pen Inject 100 Units into the skin 2 (two) times daily.   Yes [provider]  ipratropium (ATROVENT) 0.06 % nasal spray Place 2 sprays into both nostrils 4 (four) times daily. 08/29/21  Yes Margarette Canada, NP  isosorbide mononitrate (IMDUR) 60 MG 24 hr tablet Take 60 mg by mouth daily. 07/01/21  Yes [provider]  ketoconazole (NIZORAL) 2 % shampoo Apply 1 application topically 2 (two) times a week. tues and thursday 08/27/14  Yes [provider]  Melatonin 5 MG TABS Take 10 mg by mouth at bedtime as needed (sleep).   Yes [provider]  metFORMIN (GLUCOPHAGE-XR) 500 MG 24 hr tablet Take 1,000 mg by mouth 2 (two) times daily.   Yes [provider]  metoprolol tartrate (LOPRESSOR) 100  MG tablet TAKE 1 AND 1/2 TABLETS(150 MG) BY MOUTH TWICE DAILY 10/31/21  Yes Gollan, Kathlene November, MD  mirtazapine (REMERON) 45 MG tablet Take 45 mg by mouth at bedtime.   Yes [provider]  Multiple Vitamin (MULTIVITAMIN) tablet Take 1 tablet by mouth daily.   Yes [provider]  nitroGLYCERIN (NITROSTAT) 0.4 MG SL tablet PLACE 1 TABLET SUBLINGUALLY EVERY 5 MINUTES AS NEEDED FOR CHEST PAIN. CALL 911 IF YOU HAVE TAKEN 3 NITRO FOR CHEST PAIN 09/21/21  Yes Gollan, Kathlene November, MD  nortriptyline (PAMELOR) 25 MG capsule Take 50 mg by mouth at bedtime.   Yes [provider]  omega-3 acid ethyl esters (LOVAZA) 1 g capsule Take 1 g by mouth  3 (three) times daily.   Yes [provider]  omeprazole (PRILOSEC) 20 MG capsule Take 20 mg by mouth daily. 09/08/21  Yes [provider]  ondansetron (ZOFRAN-ODT) 4 MG disintegrating tablet Take 4 mg by mouth every 8 (eight) hours as needed for nausea or vomiting.   Yes [provider]  polyethylene glycol (MIRALAX / GLYCOLAX) 17 g packet Take 17 g by mouth 3 (three) times daily.   Yes [provider]  rosuvastatin (CRESTOR) 40 MG tablet TAKE 1 TABLET(40 MG) BY MOUTH DAILY 04/30/19  Yes Gollan, Tollie Pizza, MD  solifenacin (VESICARE) 10 MG tablet Take 10 mg by mouth daily.   Yes [provider]  spironolactone (ALDACTONE) 50 MG tablet Take 100 mg by mouth daily. 06/15/21  Yes [provider]  tiZANidine (ZANAFLEX) 4 MG tablet Take 1 tablet (4 mg total) by mouth 3 (three) times daily. 07/06/21 07/06/22 Yes Varney Daily, PA  TRULICITY 4.5 MG/0.5ML SOPN SMARTSIG:4.5 Milligram(s) SUB-Q Once a Week 09/23/21  Yes [provider]  Elastic Bandages & Supports (T.E.D. BELOW KNEE/MEDIUM) MISC 1 each by Does not apply route daily. Measure legs and give appropriate size. 09/12/21   Calvert Cantor, MD  feeding supplement, GLUCERNA SHAKE, (GLUCERNA SHAKE) LIQD Take 237 mLs by mouth 4 (four) times daily.     [provider]  finasteride (PROSCAR) 5 MG tablet Take 5 mg by mouth daily.    [provider]  lisinopril (ZESTRIL) 40 MG tablet Take 40 mg by mouth daily.    [provider]  naproxen (NAPROSYN) 500 MG tablet Take 500 mg by mouth 2 (two) times daily. 09/08/21   [provider]  Spacer/Aero-Holding Chambers (AEROCHAMBER PLUS) inhaler Use as instructed 03/23/18   Domenick Gong, MD     Family History  Adopted: Yes  Family history unknown: Yes    Social History   Socioeconomic History   Marital status: Married    Spouse name: Not on file   Number of children: Not on file   Years of education: Not on file   Highest education level: Not on file  Occupational History   Occupation: Conservation officer, nature  Tobacco Use   Smoking status: Every Day    Types: Pipe, Cigars, E-cigarettes    Last attempt to quit: 01/2018    Years since quitting: 4.1   Smokeless tobacco: Never   Tobacco comments:    Declines patch  Vaping Use   Vaping Use: Every day   Substances: Nicotine, Flavoring  Substance and Sexual Activity   Alcohol use: Not Currently    Alcohol/week: 3.0 standard drinks of alcohol    Types: 3 Cans of beer per week   Drug use: No   Sexual activity: Not on file  Other Topics Concern   Not on file  Social History Narrative   Uses crutches to ambulate   Social Determinants of Health   Financial Resource Strain: Not on file  Food Insecurity: No Food Insecurity (11/24/2021)   Hunger Vital Sign    Worried About Running Out of Food in the Last Year: Never true    Ran Out of Food in the Last Year: Never true  Transportation Needs: No Transportation Needs (11/24/2021)   PRAPARE - Administrator, Civil Service (Medical): No    Lack of Transportation (Non-Medical): No  Physical Activity: Not on file  Stress: Not on file  Social Connections: Not on file     Review of Systems: A 12 point ROS  discussed and pertinent positives are indicated in  the HPI above.  All other systems are negative.  Review of Systems  Constitutional:  Negative for appetite change, chills and fever.  Respiratory:  Negative for cough and shortness of breath.   Cardiovascular:  Negative for chest pain.  Gastrointestinal:  Positive for nausea. Negative for abdominal pain and vomiting.  Musculoskeletal:  Positive for back pain.  Neurological:  Negative for dizziness and headaches.    Vital Signs: BP (!) 141/102 (BP Location: Right Arm)   Pulse 96   Temp 98.2 F (36.8 C)   Resp 18   Ht 5\' 6"  (1.676 m)   Wt 151 lb 0.2 oz (68.5 kg)   SpO2 100%   BMI 24.37 kg/m   Code status: Full code   Physical Exam Vitals reviewed.  Constitutional:      Appearance: Normal appearance.  HENT:     Head: Normocephalic and atraumatic.     Mouth/Throat:     Mouth: Mucous membranes are dry.     Pharynx: Oropharynx is clear.  Eyes:     Extraocular Movements: Extraocular movements intact.     Pupils: Pupils are equal, round, and reactive to light.  Cardiovascular:     Rate and Rhythm: Normal rate and regular rhythm.     Pulses: Normal pulses.     Heart sounds: Normal heart sounds. No murmur heard. Pulmonary:     Effort: No respiratory distress.     Breath sounds: Normal breath sounds.  Abdominal:     General: Bowel sounds are normal. There is no distension.     Palpations: Abdomen is soft.     Tenderness: There is no abdominal tenderness. There is no guarding.  Musculoskeletal:     Right lower leg: No edema.     Left lower leg: No edema.  Skin:    General: Skin is warm and dry.  Neurological:     Mental Status: He is alert and oriented to person, place, and time.  Psychiatric:        Mood and Affect: Mood normal.        Behavior: Behavior normal.        Thought Content: Thought content normal.        Judgment: Judgment normal.     Imaging: PERIPHERAL VASCULAR CATHETERIZATION  Result Date: 03/10/2022 See surgical note for result.  CT ABDOMEN  PELVIS WO CONTRAST  Result Date: 03/05/2022 CLINICAL DATA:  Right lower quadrant abdominal pain. EXAM: CT ABDOMEN AND PELVIS WITHOUT CONTRAST TECHNIQUE: Multidetector CT imaging of the abdomen and pelvis was performed following the standard protocol without IV contrast. RADIATION DOSE REDUCTION: This exam was performed according to the departmental dose-optimization program which includes automated exposure control, adjustment of the mA and/or kV according to patient size and/or use of iterative reconstruction technique. COMPARISON:  CT of the abdomen and pelvis without contrast 02/25/2022 FINDINGS: Lower chest: The lung bases are clear. Heart size is normal. Minimal pericardial fluid is likely within normal limits. Hepatobiliary: The liver is unremarkable. Layering gallstones are present at the neck of the gallbladder without significant inflammatory changes about the gallbladder. The common bile duct is within normal limits. Pancreas: Unremarkable. No pancreatic ductal dilatation or surrounding inflammatory changes. Spleen: Normal in size without focal abnormality. Adrenals/Urinary Tract: The adrenal glands are normal bilaterally. Kidneys are unremarkable. No stone or mass lesion is present. The ureters are within normal limits bilaterally. Circumferential thickening of the bladder wall is again noted. Stomach/Bowel: The stomach and  duodenum within normal limits. Small bowel is unremarkable. Terminal ileum is within normal limits. Contrast is present with throughout the colon. Diverticular changes are present the ascending transverse colon without focal inflammation. The colon is incompletely distended. Moderate stool is present in the sigmoid colon. There is marked thickening of the rectal mucosa without a discrete mass. Vascular/Lymphatic: No significant vascular findings are present. No enlarged abdominal or pelvic lymph nodes. Reproductive: Prostate gland and seminal vesicles are within normal limits. Other:  No abdominal wall hernia or abnormality. No abdominopelvic ascites. Subcutaneous inflammatory changes over the lower abdomen likely reflect injection sites. Musculoskeletal: Vertebral body heights and alignment are normal. Chronic bilateral hip dislocations are noted. No other acute or focal osseous abnormalities are present. IMPRESSION: 1. Marked thickening of the rectal mucosa without a discrete is most likely inflammatory. No discrete mass lesion is present. Correlate with exam. 2. Colonic diverticulosis without diverticulitis. 3. Extensive bladder wall thickening again noted. This may reflect chronic cystitis. 4. Cholelithiasis without evidence for cholecystitis. 5. Chronic bilateral hip dislocations. Electronically Signed   By: San Morelle M.D.   On: 03/05/2022 13:40   DG Chest Port 1 View  Result Date: 03/04/2022 CLINICAL DATA:  Central line placement EXAM: PORTABLE CHEST 1 VIEW COMPARISON:  Chest x-ray March 02, 2022 FINDINGS: A new double lumen right central line has been placed, terminating in the right atrium, approximally 2.5 cm inferior to the caval atrial junction. The cardiomediastinal silhouette is stable. No pneumothorax. No other acute abnormalities. IMPRESSION: A new double lumen right central line terminates in the right atrium, approximally 2.5 cm inferior to the caval atrial junction. No pneumothorax. Electronically Signed   By: Dorise Bullion III M.D.   On: 03/04/2022 16:08   DG Chest Port 1 View  Result Date: 03/02/2022 CLINICAL DATA:  [id number] Altered mental status [id number] EXAM: PORTABLE CHEST - 1 VIEW COMPARISON:  12/24/2021 FINDINGS: Cardiac silhouette is unremarkable. No pneumothorax or pleural effusion. The lungs are clear. The visualized skeletal structures are unremarkable. IMPRESSION: No acute cardiopulmonary process. Electronically Signed   By: Sammie Bench M.D.   On: 03/02/2022 14:57   CT HEAD WO CONTRAST (5MM)  Result Date: 03/02/2022 CLINICAL DATA:  Altered  mental status EXAM: CT HEAD WITHOUT CONTRAST TECHNIQUE: Contiguous axial images were obtained from the base of the skull through the vertex without intravenous contrast. RADIATION DOSE REDUCTION: This exam was performed according to the departmental dose-optimization program which includes automated exposure control, adjustment of the mA and/or kV according to patient size and/or use of iterative reconstruction technique. COMPARISON:  02/27/2022 FINDINGS: Brain: No evidence of acute infarction, hemorrhage, hydrocephalus, extra-axial collection or mass lesion/mass effect. Vascular: No hyperdense vessel or unexpected calcification. Skull: Normal. Negative for fracture or focal lesion. Sinuses/Orbits: No acute finding. IMPRESSION: No acute intracranial process. Electronically Signed   By: Sammie Bench M.D.   On: 03/02/2022 14:50   US RENAL  Result Date: 03/02/2022 CLINICAL DATA:  Acute renal failure. EXAM: RENAL / URINARY TRACT ULTRASOUND COMPLETE COMPARISON:  CT abdomen/pelvis 02/25/2022. FINDINGS: Right Kidney: Renal measurements: 12.3 x 7.4 x 6.3 = volume: 297.9. mL. Increased echogenicity. No mass or hydronephrosis. Left Kidney: Renal measurements: 10.4 x 7.3 x 6.1 = volume: 241.2 mL. Increased echogenicity. No mass or hydronephrosis. Bladder: Appears normal for degree of bladder distention. Other: None. IMPRESSION: Increased echogenicity in the bilateral kidneys consistent with medical renal disease. Electronically Signed   By: Emmit Alexanders M.D.   On: 03/02/2022 13:47   CT Head Wo  Contrast  Result Date: 02/27/2022 CLINICAL DATA:  Headache, sudden, severe EXAM: CT HEAD WITHOUT CONTRAST TECHNIQUE: Contiguous axial images were obtained from the base of the skull through the vertex without intravenous contrast. RADIATION DOSE REDUCTION: This exam was performed according to the departmental dose-optimization program which includes automated exposure control, adjustment of the mA and/or kV according to  patient size and/or use of iterative reconstruction technique. COMPARISON:  12/24/2021 FINDINGS: Brain: No evidence of acute infarction, hemorrhage, hydrocephalus, extra-axial collection or mass lesion/mass effect. Vascular: No hyperdense vessel or unexpected calcification. Skull: Normal. Negative for fracture or focal lesion. Sinuses/Orbits: No acute finding. IMPRESSION: No acute intracranial process. Electronically Signed   By: Layla Maw M.D.   On: 02/27/2022 16:09   CT ABDOMEN PELVIS WO CONTRAST  Result Date: 02/25/2022 CLINICAL DATA:  Left lower quadrant abdominal pain. EXAM: CT ABDOMEN AND PELVIS WITHOUT CONTRAST TECHNIQUE: Multidetector CT imaging of the abdomen and pelvis was performed following the standard protocol without IV contrast. RADIATION DOSE REDUCTION: This exam was performed according to the departmental dose-optimization program which includes automated exposure control, adjustment of the mA and/or kV according to patient size and/or use of iterative reconstruction technique. COMPARISON:  CT abdomen pelvis dated 12/24/2021. FINDINGS: Evaluation of this exam is limited in the absence of intravenous contrast. Lower chest: The visualized lung bases are clear. No intra-abdominal free air or free fluid. Hepatobiliary: The liver is unremarkable. No radiopaque a shin. Multiple gallstones. No pericholecystic fluid or evidence of acute cholecystitis by CT. Pancreas: Unremarkable. No pancreatic ductal dilatation or surrounding inflammatory changes. Spleen: Normal in size without focal abnormality. Adrenals/Urinary Tract: The adrenal glands are unremarkable. There is no hydronephrosis or nephrolithiasis on either side. The visualized ureters appear unremarkable. Circumferential thickening of the bladder wall as seen on the prior CT, likely representing chronic infection or bladder dysfunction. Correlation with urinalysis recommended to evaluate for possibility of active cystitis. Stomach/Bowel:  Moderate stool throughout the colon. There is scattered colonic diverticula without active inflammatory changes. There is no bowel obstruction or active inflammation. The appendix is not visualized with certainty. No inflammatory changes identified in the right lower quadrant. Vascular/Lymphatic: The abdominal aorta and IVC are grossly unremarkable on this noncontrast CT. No portal venous gas. There is no adenopathy. Reproductive: The prostate and seminal vesicles are grossly unremarkable no free gas. Other: Fatty atrophy of the pelvic musculature. Midline vertical anterior abdominal wall incisional scar. Focal area of induration of the subcutaneous soft tissues of the right anterior abdominal wall, likely related to subcutaneous injection or sequela of contusion. No fluid collection. 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 No acute osseous pathology. IMPRESSION: 1. No hydronephrosis or nephrolithiasis. 2. Circumferential thickening of the bladder wall as seen on the prior CT, likely representing chronic infection or neurogenic bladder dysfunction. Correlation with urinalysis recommended to evaluate for possibility of active cystitis. 3. Colonic diverticulosis. No bowel obstruction. 4. Cholelithiasis. Electronically Signed   By: Elgie Collard M.D.   On: 02/25/2022 01:48    Labs:  CBC: Recent Labs    03/08/22 0207 03/09/22 0357 03/11/22 1521 03/12/22 0625  WBC 5.4 5.5 7.2 7.8  HGB 13.3 13.3 12.7* 13.1  HCT 39.7 39.4 38.2* 39.5  PLT 228 239 340 335    COAGS: Recent Labs    09/09/21 2000 03/06/22 1726 03/13/22 0545  INR 1.0 1.2 1.1  APTT 31  --   --     BMP: Recent Labs    03/09/22 0357 03/10/22 0906 03/11/22 1521  03/12/22 0625  NA 135 135 135 138  K 3.3* 3.1* 3.2* 3.3*  CL 101 99 99 99  CO2 22 22 23 29   GLUCOSE 193* 202* 190* 221*  BUN 53* 41* 53* 33*  CALCIUM 8.9 8.8* 9.1 9.1  CREATININE 6.41* 4.36* 4.28* 2.73*  GFRNONAA 10* 16* 17* 29*    LIVER  FUNCTION TESTS: Recent Labs    03/05/22 0602 03/06/22 0449 03/07/22 0423 03/08/22 0207 03/10/22 0906  BILITOT 0.8 0.6 0.8 1.2  --   AST 52* 52* 51* 44*  --   ALT 17 16 19 21   --   ALKPHOS 69 67 76 84  --   PROT 5.9* 5.9* 6.4* 7.1  --   ALBUMIN 2.8* 2.8* 2.9* 3.2* 3.5    TUMOR MARKERS: No results for input(s): "AFPTM", "CEA", "CA199", "CHROMGRNA" in the last 8760 hours.  Assessment and Plan:  42 year old male with PMH significant for CKD stage IV with AKI presents to IR for random renal biopsy.  Pt resting in bed.  He is A&O, calm and pleasant.  He is in no distress.   NPO after MN.  Hold 2/6 SQ heparin doses.  INR 1.1 on 2/5  Risks and benefits of random renal biopsy with moderate sedation was discussed with the patient and/or patient's family including, but not limited to bleeding, infection, damage to adjacent structures or low yield requiring additional tests.  All of the questions were answered and there is agreement to proceed.  Consent signed and in chart.  Thank you for this interesting consult.  I greatly enjoyed meeting Cory Campbell and look forward to participating in their care.  A copy of this report was sent to the requesting provider on this date.  Electronically Signed: Shon HoughSusan T Mccabe Gloria, NP 03/13/2022, 10:41 AM   I spent a total of 20 minutes in face to face in clinical consultation, greater than 50% of which was counseling/coordinating care for AKI in the setting of stage IV CKD.

## 2022-03-13 NOTE — Progress Notes (Signed)
Noted increase in patient heart rate, lowered bfr per instruction of charge nurse. Will check the pulse manually, the patient denies symptoms at this time.

## 2022-03-13 NOTE — Progress Notes (Signed)
Progress Note    03/13/2022 11:43 AM 3 Days Post-Op  Subjective:  Cory Campbell is a 42 year old male with his of arthrogryposis multiplex congenita [facility]), walks with crutches at baseline, CKD4, depression, HIV on Biktarvy who presented to the emergency room on 1/25 with hypoglycemia and noted to have hypotension.  Noted to have hyperkalemia and AKI and urinary tract infection.  Nephrology consulted workup underway as well as discussion to start dialysis.  Vascular surgery saw patient and placed temporary dialysis catheter on 1/27.  After several sessions of hemodialysis, plan is for renal biopsy which unable to do until 2/5 due to patient being on aspirin.  Showing some signs of improvement in creatinine, although still needs hemodialysis.  Vascular surgery taking for PermCath placement 2/2.  Temp cath removed from right chest without complication. Perm cath dialysis cath placed in left chest.     Vitals:   03/13/22 0303 03/13/22 0904  BP: (!) 121/90 (!) 141/102  Pulse: 94 96  Resp: 16 18  Temp: 99.1 F (37.3 C) 98.2 F (36.8 C)  SpO2: 100% 100%   Physical Exam: Cardiac:  Regular rate and rhythm, S1 and S2  Lungs:  Clear to auscultation bilaterally  Incisions: Right Chest post temp cath removal C/D/I. Left Chest new perm cath dressing C/D/I no hematoma or seroma to note.  Extremities:  Palpable pulses throughout.  Abdomen:  Soft nontender, nondistended, positive bowel sounds  Neurologic: Alert and oriented x 2, no acute distress   CBC    Component Value Date/Time   WBC 7.8 03/12/2022 0625   RBC 4.91 03/12/2022 0625   HGB 13.1 03/12/2022 0625   HGB 9.2 (L) 05/16/2017 1521   HCT 39.5 03/12/2022 0625   HCT 28.3 (L) 05/16/2017 1521   PLT 335 03/12/2022 0625   PLT 798 (H) 05/16/2017 1521   MCV 80.4 03/12/2022 0625   MCV 85 05/16/2017 1521   MCV 85 08/28/2013 0024   MCH 26.7 03/12/2022 0625   MCHC 33.2 03/12/2022 0625   RDW 15.6 (H) 03/12/2022 0625   RDW 13.8  05/16/2017 1521   RDW 13.1 08/28/2013 0024   LYMPHSABS 1.4 03/02/2022 1208   LYMPHSABS 0.7 05/16/2017 1521   MONOABS 0.7 03/02/2022 1208   EOSABS 0.0 03/02/2022 1208   EOSABS 0.0 05/16/2017 1521   BASOSABS 0.0 03/02/2022 1208   BASOSABS 0.0 05/16/2017 1521    BMET    Component Value Date/Time   NA 138 03/12/2022 0625   NA 139 07/07/2020 0844   NA 134 (L) 08/28/2013 0024   K 3.3 (L) 03/12/2022 0625   K 2.9 (L) 08/28/2013 0024   CL 99 03/12/2022 0625   CL 96 (L) 08/28/2013 0024   CO2 29 03/12/2022 0625   CO2 28 08/28/2013 0024   GLUCOSE 221 (H) 03/12/2022 0625   GLUCOSE 331 (H) 08/28/2013 0024   BUN 33 (H) 03/12/2022 0625   BUN 16 07/07/2020 0844   BUN 8 08/28/2013 0024   CREATININE 2.73 (H) 03/12/2022 0625   CREATININE 0.70 08/28/2013 0024   CALCIUM 9.1 03/12/2022 0625   CALCIUM 7.8 (L) 08/28/2013 0024   GFRNONAA 29 (L) 03/12/2022 0625   GFRNONAA >60 08/28/2013 0024   GFRAA >60 09/15/2018 1835   GFRAA >60 08/28/2013 0024    INR    Component Value Date/Time   INR 1.1 03/13/2022 0545   INR 0.9 08/28/2013 0024     Intake/Output Summary (Last 24 hours) at 03/13/2022 1143 Last data filed at 03/13/2022 0908  Gross per 24 hour  Intake --  Output 1500 ml  Net -1500 ml     Assessment/Plan:  42 y.o. male is s/p Dialysis [facility] Cath placement.  3 Days Post-Op   Per vascular perm cath working well.  Will sign off. If needed again please consult.  Patient to have kidney biopsy on 03/14/22 DVT prophylaxis:  Heparin 5000 units SQ QD   Drema [facility] 03/13/2022 11:43 AM

## 2022-03-13 NOTE — Progress Notes (Addendum)
1730: Patient's cartridge clotted. Cartridge changed and treatment restarted.

## 2022-03-13 NOTE — Discharge Planning (Signed)
PLACEMENT RESOLVED: [place]  [address], [place] [address] [phone]  Scheduled Days: Monday Wednesday and Friday  Treatment time: 12:00pm First Treatment: TBD at 11:15am

## 2022-03-13 NOTE — Progress Notes (Signed)
Received patient in bed to unit.  Alert and oriented.  Informed consent signed and in chart.   TX duration: 3.5 hours  Patient tolerated well.  Transported back to the room  Alert, without acute distress.  Hand-off given to patient's nurse.   Access used: [facility] Access issues: none  Total UF removed: 0 Medication(s) given: none  Post HD weight: patient is unable to stand   [facility]

## 2022-03-13 NOTE — Progress Notes (Signed)
Patient with elevating HR, no complaint of CP, no SOB. No fluid removal this HD session, BFR reduced to 300, notified on call Nephrologist, instructed to give 300 ml NS and continue.

## 2022-03-13 NOTE — Progress Notes (Addendum)
[facility]  ROUNDING NOTE   Subjective:   Patient seen resting quietly, appears drowsy this morning Currently n.p.o. for scheduled procedure Tolerated dinner well last night No nausea or vomiting No lower extremity edema  Objective:  Vital signs in last 24 hours:  Temp:  [98 F (36.7 C)-99.2 F (37.3 C)] 98.2 F (36.8 C) (02/05 0904) Pulse Rate:  [91-109] 96 (02/05 0904) Resp:  [16-18] 18 (02/05 0904) BP: (102-141)/(67-102) 141/102 (02/05 0904) SpO2:  [99 %-100 %] 100 % (02/05 0904)  Weight change:  Filed Weights   03/09/22 1559 03/09/22 1909 03/11/22 0519  Weight: 67.2 kg 67.1 kg 68.5 kg    Intake/Output: I/O last 3 completed shifts: In: 240 [P.O.:240] Out: 1650 [Urine:1650]   Intake/Output this shift:  Total I/O In: -  Out: 350 [Urine:350]  Physical Exam: General: NAD  Head: Normocephalic, atraumatic. Moist oral mucosal membranes  Eyes: Anicteric,  Lungs:  Clear to auscultation, normal effort  Heart: Regular rate and rhythm  Abdomen:  Soft, nontender  Extremities: No lower extremity edema  Neurologic: Somnolent but arousable   Skin: No lesions  Access:  Left chest PermCath placed on 04/11/1441    Basic Metabolic Panel: Recent Labs  Lab 03/07/22 0423 03/08/22 0207 03/09/22 0357 03/10/22 0906 03/11/22 1521 03/12/22 0625  NA 137 136 135 135 135 138  K 3.6 3.7 3.3* 3.1* 3.2* 3.3*  CL 103 101 101 99 99 99  CO2 23 22 22 22 23 29   GLUCOSE 96 145* 193* 202* 190* 221*  BUN 32* 40* 53* 41* 53* 33*  CREATININE 5.56* 6.66* 6.41* 4.36* 4.28* 2.73*  CALCIUM 7.9* 8.7* 8.9 8.8* 9.1 9.1  MG 2.2 2.2  --   --   --   --   PHOS 4.2 5.1*  --  3.5  --   --      Liver Function Tests: Recent Labs  Lab 03/07/22 0423 03/08/22 0207 03/10/22 0906  AST 51* 44*  --   ALT 19 21  --   ALKPHOS 76 84  --   BILITOT 0.8 1.2  --   PROT 6.4* 7.1  --   ALBUMIN 2.9* 3.2* 3.5    No results for input(s): "LIPASE", "AMYLASE" in the last 168 hours. No results  for input(s): "AMMONIA" in the last 168 hours.  CBC: Recent Labs  Lab 03/07/22 0423 03/08/22 0207 03/09/22 0357 03/11/22 1521 03/12/22 0625  WBC 4.5 5.4 5.5 7.2 7.8  HGB 12.5* 13.3 13.3 12.7* 13.1  HCT 36.9* 39.7 39.4 38.2* 39.5  MCV 79.7* 79.7* 79.1* 79.7* 80.4  PLT 196 228 239 340 335     Cardiac Enzymes: No results for input(s): "CKTOTAL", "CKMB", "CKMBINDEX", "TROPONINI" in the last 168 hours.  BNP: Invalid input(s): "POCBNP"  CBG: Recent Labs  Lab 03/12/22 0755 03/12/22 1225 03/12/22 1723 03/12/22 2041 03/13/22 0903  GLUCAP 200* 433* 171* 165* 65*     Microbiology: Results for orders placed or performed during the hospital encounter of 03/02/22  Culture, blood (Routine X 2) w Reflex to ID Panel     Status: None   Collection Time: 03/02/22  2:15 PM   Specimen: BLOOD  Result Value Ref Range Status   Specimen Description BLOOD LEFT ANTECUBITAL  Final   Special Requests   Final    BOTTLES DRAWN AEROBIC AND ANAEROBIC Blood Culture adequate volume   Culture   Final    NO GROWTH 5 DAYS Performed at [facility], [address] [place], [place]  [address]    Report Status 03/07/2022 FINAL  Final  Culture, blood (Routine X 2) w Reflex to ID Panel     Status: None   Collection Time: 03/02/22  2:20 PM   Specimen: BLOOD  Result Value Ref Range Status   Specimen Description BLOOD RIGHT ANTECUBITAL  Final   Special Requests   Final    BOTTLES DRAWN AEROBIC AND ANAEROBIC Blood Culture adequate volume   Culture   Final    NO GROWTH 5 DAYS Performed at [facility], [address], [place], [place] [address]    Report Status 03/07/2022 FINAL  Final  Urine Culture (for pregnant, neutropenic or urologic patients or patients with an indwelling urinary catheter)     Status: Abnormal   Collection Time: 03/02/22  3:53 PM   Specimen: Urine, Clean Catch  Result Value Ref Range Status   Specimen Description   Final    URINE, CLEAN CATCH Performed at  [facility], [address] [place], [place] [address]    Special Requests   Final    NONE Performed at [facility], [address] [place], [place] [address]    Culture (A)  Final    >=100,000 COLONIES/mL ENTEROCOCCUS FAECALIS >=100,000 COLONIES/mL LACTOBACILLUS SPECIES Standardized susceptibility testing for this organism is not available. Performed at [facility], [place] [address] [place], [place] [address]    Report Status 03/05/2022 FINAL  Final   Organism ID, Bacteria ENTEROCOCCUS FAECALIS (A)  Final      Susceptibility   Enterococcus faecalis - MIC*    AMPICILLIN <=2 SENSITIVE Sensitive     NITROFURANTOIN <=16 SENSITIVE Sensitive     VANCOMYCIN 1 SENSITIVE Sensitive     * >=100,000 COLONIES/mL ENTEROCOCCUS FAECALIS  MRSA Next Gen by PCR, Nasal     Status: None   Collection Time: 03/03/22  6:05 AM   Specimen: Nasal Mucosa; Nasal Swab  Result Value Ref Range Status   MRSA by PCR Next Gen NOT DETECTED NOT DETECTED Final    Comment: (NOTE) The GeneXpert MRSA Assay (FDA approved for NASAL specimens only), is one component of a comprehensive MRSA colonization surveillance program. It is not intended to diagnose MRSA infection nor to guide or monitor treatment for MRSA infections. Test performance is not FDA approved in patients less than 71 years old. Performed at [facility], [address] [place], [place] [address]     Coagulation Studies: Recent Labs    03/13/22 0545  LABPROT 13.8  INR 1.1     Urinalysis: No results for input(s): "COLORURINE", "LABSPEC", "PHURINE", "GLUCOSEU", "HGBUR", "BILIRUBINUR", "KETONESUR", "PROTEINUR", "UROBILINOGEN", "NITRITE", "LEUKOCYTESUR" in the last 72 hours.  Invalid input(s): "APPERANCEUR"     Imaging: No results found.   Medications:    anticoagulant sodium citrate      acyclovir  200 mg Oral BID   amLODipine  10 mg Oral QPM   buprenorphine  1 patch Transdermal Weekly    Chlorhexidine Gluconate Cloth  6 each Topical Q0600   docusate sodium  100 mg Oral BID   heparin  5,000 Units Subcutaneous Q8H   hydrALAZINE  50 mg Oral TID   insulin aspart  0-5 Units Subcutaneous QHS   insulin aspart  0-9 Units Subcutaneous TID WC   [START ON 03/14/2022] insulin glargine-yfgn  22 Units Subcutaneous Daily   isosorbide dinitrate  20 mg Oral TID   lidocaine  1 patch Transdermal Q24H   metoprolol tartrate  75 mg Oral BID  polyethylene glycol  17 g Oral BID   rosuvastatin  10 mg Oral QHS   sodium bicarbonate  1,300 mg Oral BID   acetaminophen, albuterol, alteplase, anticoagulant sodium citrate, cyclobenzaprine, heparin, hydrALAZINE, lidocaine (PF), lidocaine-prilocaine, LORazepam, melatonin, nicotine, nitroGLYCERIN, pentafluoroprop-tetrafluoroeth, senna-docusate  Assessment/ Plan:  Cory Campbell is a 42 y.o.  male with past medical conditions including depression, HIV, hyperlipidemia, hypertension, BPH, diabetes who was admitted to [facility] on 03/02/2022 for Hyperkalemia [E87.5] Hypoglycemia [E16.2] Acute renal failure, unspecified acute renal failure type ([place]) [N17.9]   Acute kidney injury with proteinuria/hyperkalemia/hyponatremia.  Creatinine 5.05 on ED admission.  AKI appears multifactorial due to recent UTI infection, hypovolemia, and NSAID use.  NSAID use can cause glomular disease and FSGS, resulting in proteinuria.  Other differential includes HIV-associated nephropathy.  Patient also has underlying insulin-dependent diabetes.  Hemoglobin A1c 7.8% from 09/09/2021.   UPC ratio 3.95  on 03/04/2022.  Renal ultrasound negative for obstruction. Serologic workup complements-C3-C4 normal.  ANA negative,  anca, NEG. kappa /Lambda ration mildly elevated.   Baseline creatinine of 0.78 on November 28, 2021. Appreciate nursing staff removing right IJ temp cath.  Renal biopsy scheduled for today, now rescheduled for tomorrow. Treatment plan will be determined from results. Will  perform dialysis later today.    Lab Results  Component Value Date   CREATININE 2.73 (H) 03/12/2022   CREATININE 4.28 (H) 03/11/2022   CREATININE 4.36 (H) 03/10/2022    Intake/Output Summary (Last 24 hours) at 03/13/2022 1238 Last data filed at 03/13/2022 0908 Gross per 24 hour  Intake --  Output 1500 ml  Net -1500 ml    2. Hypertension, essential. Home regimen includes amlodipine, clonidine, isosorbide, lisinopril and metoprolol.  Continue amlodipine, hydralazine, isosorbide dinitrate. Blood pressure 121/90, stable.     LOS: [place] 2/5/202412:38 PM

## 2022-03-14 ENCOUNTER — Inpatient Hospital Stay: Payer: Medicaid Other

## 2022-03-14 DIAGNOSIS — R531 Weakness: Secondary | ICD-10-CM

## 2022-03-14 DIAGNOSIS — B20 Human immunodeficiency virus [HIV] disease: Secondary | ICD-10-CM | POA: Diagnosis not present

## 2022-03-14 DIAGNOSIS — I10 Essential (primary) hypertension: Secondary | ICD-10-CM

## 2022-03-14 DIAGNOSIS — E785 Hyperlipidemia, unspecified: Secondary | ICD-10-CM

## 2022-03-14 DIAGNOSIS — E872 Acidosis, unspecified: Secondary | ICD-10-CM | POA: Diagnosis not present

## 2022-03-14 DIAGNOSIS — E1169 Type 2 diabetes mellitus with other specified complication: Secondary | ICD-10-CM

## 2022-03-14 DIAGNOSIS — R296 Repeated falls: Secondary | ICD-10-CM

## 2022-03-14 DIAGNOSIS — I952 Hypotension due to drugs: Secondary | ICD-10-CM | POA: Diagnosis not present

## 2022-03-14 DIAGNOSIS — E875 Hyperkalemia: Secondary | ICD-10-CM | POA: Diagnosis not present

## 2022-03-14 LAB — BASIC METABOLIC PANEL
Anion gap: 12 (ref 5–15)
BUN: 27 mg/dL — ABNORMAL HIGH (ref 6–20)
CO2: 27 mmol/L (ref 22–32)
Calcium: 8.9 mg/dL (ref 8.9–10.3)
Chloride: 98 mmol/L (ref 98–111)
Creatinine, Ser: 1.85 mg/dL — ABNORMAL HIGH (ref 0.61–1.24)
GFR, Estimated: 46 mL/min — ABNORMAL LOW (ref >60)
Glucose, Bld: 225 mg/dL — ABNORMAL HIGH (ref 70–99)
Potassium: 3 mmol/L — ABNORMAL LOW (ref 3.5–5.1)
Sodium: 137 mmol/L (ref 135–145)

## 2022-03-14 LAB — CBC WITH DIFFERENTIAL/PLATELET
Abs Immature Granulocytes: 0.07 10*3/uL (ref 0.00–0.07)
Basophils Absolute: 0.1 10*3/uL (ref 0.0–0.1)
Basophils Relative: 1 %
Eosinophils Absolute: 0.1 10*3/uL (ref 0.0–0.5)
Eosinophils Relative: 2 %
HCT: 36.1 % — ABNORMAL LOW (ref 39.0–52.0)
Hemoglobin: 12.1 g/dL — ABNORMAL LOW (ref 13.0–17.0)
Immature Granulocytes: 1 %
Lymphocytes Relative: 26 %
Lymphs Abs: 1.6 10*3/uL (ref 0.7–4.0)
MCH: 27.2 pg (ref 26.0–34.0)
MCHC: 33.5 g/dL (ref 30.0–36.0)
MCV: 81.1 fL (ref 80.0–100.0)
Monocytes Absolute: 0.5 10*3/uL (ref 0.1–1.0)
Monocytes Relative: 8 %
Neutro Abs: 3.9 10*3/uL (ref 1.7–7.7)
Neutrophils Relative %: 62 %
Platelets: 403 10*3/uL — ABNORMAL HIGH (ref 150–400)
RBC: 4.45 MIL/uL (ref 4.22–5.81)
RDW: 15.6 % — ABNORMAL HIGH (ref 11.5–15.5)
WBC: 6.3 10*3/uL (ref 4.0–10.5)
nRBC: 0 % (ref 0.0–0.2)

## 2022-03-14 LAB — GLUCOSE, CAPILLARY
Glucose-Capillary: 166 mg/dL — ABNORMAL HIGH (ref 70–99)
Glucose-Capillary: 178 mg/dL — ABNORMAL HIGH (ref 70–99)
Glucose-Capillary: 243 mg/dL — ABNORMAL HIGH (ref 70–99)
Glucose-Capillary: 291 mg/dL — ABNORMAL HIGH (ref 70–99)
Glucose-Capillary: 385 mg/dL — ABNORMAL HIGH (ref 70–99)

## 2022-03-14 MED ORDER — FENTANYL CITRATE (PF) 100 MCG/2ML IJ SOLN
INTRAMUSCULAR | Status: AC
  Filled 2022-03-14: qty 2

## 2022-03-14 MED ORDER — FENTANYL CITRATE (PF) 100 MCG/2ML IJ SOLN
INTRAMUSCULAR | Status: AC | PRN
  Administered 2022-03-14: 50 ug via INTRAVENOUS

## 2022-03-14 MED ORDER — MIDAZOLAM HCL 2 MG/2ML IJ SOLN
INTRAMUSCULAR | Status: AC
  Filled 2022-03-14: qty 2

## 2022-03-14 MED ORDER — LIDOCAINE HCL (PF) 1 % IJ SOLN
10.0000 mL | Freq: Once | INTRAMUSCULAR | Status: AC
  Administered 2022-03-14: 10 mL via INTRADERMAL

## 2022-03-14 MED ORDER — MIDAZOLAM HCL 2 MG/2ML IJ SOLN
INTRAMUSCULAR | Status: AC | PRN
  Administered 2022-03-14: 1 mg via INTRAVENOUS

## 2022-03-14 NOTE — Progress Notes (Signed)
[facility] Progress Note  Patient: Cory Campbell    STM:[id number]  DOA: 03/02/2022    Date of Service: the patient was seen and examined on 03/14/2022  Brief hospital course: 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  Noted to have hyperkalemia and AKI and urinary tract infection.  Nephrology consulted workup underway as well as discussion to start dialysis.  Vascular surgery saw patient and placed temporary dialysis catheter on 1/27.  After several sessions of hemodialysis, patient showing some signs of improvement in creatinine, although still needs hemodialysis.  Vascular surgery placed PermCath placement 2/2.  Interventional radiology plans to take patient on 2/6 for renal biopsy.  2/6: Vitals stable.  Had his dialysis yesterday.  Going for renal biopsy today.  If remains stable then can be discharged home tomorrow per nephrology.   Assessment and Plan: * Hyperkalemia-resolved Secondary to worsening renal failure.  Status post Lokelma.  Metabolic acidosis Secondary to uremia  AKI (acute kidney injury) ([place]) in the setting of stage IV chronic kidney disease - Requiring dialysis.  Workup in progress, although suspecting NSAID use.  Getting intermittent dialysis.  Vascular surgery placed PermCath on 2/2.  Patient being set up with outpatient dialysis for now.  Renal biopsy initially delayed due to patient being on aspirin, but now following 5 days of clearance, scheduled for biopsy by interventional radiology today.  UTI - Completed antibiotic course  HIV (human immunodeficiency virus infection) ([place]) - May need to change medication from [facility] if creatinine clearance does not improve.  Currently Biktarvy on hold  Essential hypertension Home medications resumed with  additional medications added.  Blood pressure still elevated  Tobacco dependence - As needed nicotine patch ordered  Hyponatremia - Secondary to hypervolemia.  Sodium now normalized  Weakness - Multifactorial  Seizure disorder (HCC) - Ativan 2 mg IV as needed for seizure, 2 doses ordered  Frequent falls - Fall precaution  Sleep apnea - CPAP nightly ordered  Insulin dependent type 2 diabetes mellitus (HCC) - Insulin SSI with at bedtime coverage ordered CBGs continuing to trend upward, so continue to titrate up long-acting insulin dose, but still nowhere near patient's home dose.  Also with sliding scale.   Body mass index is 24.37 kg/m.        Consultants: Vascular surgery Interventional radiology Nephrology  Procedures: Placement of dialysis catheter PermCath placement 2/2 Renal biopsy  2/6  Antimicrobials: Completed course of Rocephin/Augmentin for UTI  Code Status: Full code   Subjective: Patient was seen and examined today.  No new complaints.  Awaiting for procedure. Objective: Vital signs were reviewed and unremarkable. Vitals:   03/14/22 1228 03/14/22 1245  BP: (!) 126/97 (!) 128/96  Pulse: 99 (!) 104  Resp: 12 10  Temp:    SpO2: 98% 98%    Intake/Output Summary (Last 24 hours) at 03/14/2022 1335 Last data filed at 03/14/2022 1110 Gross per 24 hour  Intake 240 ml  Output 500 ml  Net -260 ml    Filed Weights   03/09/22 1909 03/11/22 0519 03/14/22 1022  Weight: 67.1 kg 68.5 kg 68.5 kg   Body mass index is 24.37 kg/m.  Exam:  General.  Chronically ill-appearing gentleman, in no acute distress. Pulmonary.  Lungs clear bilaterally, normal respiratory effort. CV.  Regular rate and rhythm, no JVD,  rub or murmur. Abdomen.  Soft, nontender, nondistended, BS positive. CNS.  Alert and oriented .  No focal neurologic deficit. Extremities.  No edema, no cyanosis, pulses intact and symmetrical. Psychiatry.  Judgment and insight appears normal.    Data Reviewed: Prior data reviewed  Disposition:  Status is: Inpatient Remains inpatient appropriate because:  -Renal biopsy    Anticipated discharge date: 2/7  Family Communication: Discussed with wife on phone  DVT Prophylaxis: Place TED hose Start: 03/02/22 1312  This record has been created using Systems analyst. Errors have been sought and corrected,but may not always be located. Such creation errors do not reflect on the standard of care.   Author: Lorella Nimrod ,MD 03/14/2022 1:35 PM  To reach On-call, see care teams to locate the attending and reach out via [email or web address]. Between 7PM-7AM, please contact night-coverage If you still have difficulty reaching the attending provider, please page the [facility] (Director on Call) for [facility] on amion for assistance.

## 2022-03-14 NOTE — Progress Notes (Signed)
[facility] [place] [facility]  ROUNDING NOTE   Subjective:   Patient resting quietly Alert and oriented NPO for renal biopsy  Objective:  Vital signs in last 24 hours:  Temp:  [97.9 F (36.6 C)-99 F (37.2 C)] 98.8 F (37.1 C) (02/06 1345) Pulse Rate:  [80-120] 103 (02/06 1345) Resp:  [9-18] 16 (02/06 1400) BP: (109-165)/(84-104) 124/93 (02/06 1345) SpO2:  [98 %-100 %] 100 % (02/06 1345) Weight:  [68.5 kg] 68.5 kg (02/06 1022)  Weight change:  Filed Weights   03/09/22 1909 03/11/22 0519 03/14/22 1022  Weight: 67.1 kg 68.5 kg 68.5 kg    Intake/Output: I/O last 3 completed shifts: In: 240 [P.O.:240] Out: 1300 [Urine:1300]   Intake/Output this shift:  Total I/O In: -  Out: 200 [Urine:200]  Physical Exam: General: NAD  Head: Normocephalic, atraumatic. Moist oral mucosal membranes  Eyes: Anicteric,  Lungs:  Clear to auscultation, normal effort  Heart: Regular rate and rhythm  Abdomen:  Soft, nontender  Extremities: No lower extremity edema  Neurologic: Alert and oriented  Skin: No lesions  Access:  Left chest PermCath placed on 08/08/7104    Basic Metabolic Panel: Recent Labs  Lab 03/08/22 0207 03/09/22 0357 03/10/22 0906 03/11/22 1521 03/12/22 0625 03/14/22 0438  NA 136 135 135 135 138 137  K 3.7 3.3* 3.1* 3.2* 3.3* 3.0*  CL 101 101 99 99 99 98  CO2 22 22 22 23 29 27   GLUCOSE 145* 193* 202* 190* 221* 225*  BUN 40* 53* 41* 53* 33* 27*  CREATININE 6.66* 6.41* 4.36* 4.28* 2.73* 1.85*  CALCIUM 8.7* 8.9 8.8* 9.1 9.1 8.9  MG 2.2  --   --   --   --   --   PHOS 5.1*  --  3.5  --   --   --      Liver Function Tests: Recent Labs  Lab 03/08/22 0207 03/10/22 0906  AST 44*  --   ALT 21  --   ALKPHOS 84  --   BILITOT 1.2  --   PROT 7.1  --   ALBUMIN 3.2* 3.5    No results for input(s): "LIPASE", "AMYLASE" in the last 168 hours. No results for input(s): "AMMONIA" in the last 168 hours.  CBC: Recent Labs  Lab 03/08/22 0207 03/09/22 0357 03/11/22 1521  03/12/22 0625 03/14/22 0438  WBC 5.4 5.5 7.2 7.8 6.3  NEUTROABS  --   --   --   --  3.9  HGB 13.3 13.3 12.7* 13.1 12.1*  HCT 39.7 39.4 38.2* 39.5 36.1*  MCV 79.7* 79.1* 79.7* 80.4 81.1  PLT 228 239 340 335 403*     Cardiac Enzymes: No results for input(s): "CKTOTAL", "CKMB", "CKMBINDEX", "TROPONINI" in the last 168 hours.  BNP: Invalid input(s): "POCBNP"  CBG: Recent Labs  Lab 03/13/22 1250 03/13/22 2206 03/14/22 0020 03/14/22 0742 03/14/22 1406  GLUCAP 236* 195* 243* 178* 166*     Microbiology: Results for orders placed or performed during the hospital encounter of 03/02/22  Culture, blood (Routine X 2) w Reflex to ID Panel     Status: None   Collection Time: 03/02/22  2:15 PM   Specimen: BLOOD  Result Value Ref Range Status   Specimen Description BLOOD LEFT ANTECUBITAL  Final   Special Requests   Final    BOTTLES DRAWN AEROBIC AND ANAEROBIC Blood Culture adequate volume   Culture   Final    NO GROWTH 5 DAYS Performed at [facility], [place],  [place], [place] [address]    Report Status 03/07/2022 FINAL  Final  Culture, blood (Routine X 2) w Reflex to ID Panel     Status: None   Collection Time: 03/02/22  2:20 PM   Specimen: BLOOD  Result Value Ref Range Status   Specimen Description BLOOD RIGHT ANTECUBITAL  Final   Special Requests   Final    BOTTLES DRAWN AEROBIC AND ANAEROBIC Blood Culture adequate volume   Culture   Final    NO GROWTH 5 DAYS Performed at [facility], [address] [place], [place] [address]    Report Status 03/07/2022 FINAL  Final  Urine Culture (for pregnant, neutropenic or urologic patients or patients with an indwelling urinary catheter)     Status: Abnormal   Collection Time: 03/02/22  3:53 PM   Specimen: Urine, Clean Catch  Result Value Ref Range Status   Specimen Description   Final    URINE, CLEAN CATCH Performed at [facility], [address] [place], [place] [address]    Special  Requests   Final    NONE Performed at [facility], [address] [place], [place] [address]    Culture (A)  Final    >=100,000 COLONIES/mL ENTEROCOCCUS FAECALIS >=100,000 COLONIES/mL LACTOBACILLUS SPECIES Standardized susceptibility testing for this organism is not available. Performed at [facility], [address], [place], [place] [address]    Report Status 03/05/2022 FINAL  Final   Organism ID, Bacteria ENTEROCOCCUS FAECALIS (A)  Final      Susceptibility   Enterococcus faecalis - MIC*    AMPICILLIN <=2 SENSITIVE Sensitive     NITROFURANTOIN <=16 SENSITIVE Sensitive     VANCOMYCIN 1 SENSITIVE Sensitive     * >=100,000 COLONIES/mL ENTEROCOCCUS FAECALIS  MRSA Next Gen by PCR, Nasal     Status: None   Collection Time: 03/03/22  6:05 AM   Specimen: Nasal Mucosa; Nasal Swab  Result Value Ref Range Status   MRSA by PCR Next Gen NOT DETECTED NOT DETECTED Final    Comment: (NOTE) The GeneXpert MRSA Assay (FDA approved for NASAL specimens only), is one component of a comprehensive MRSA colonization surveillance program. It is not intended to diagnose MRSA infection nor to guide or monitor treatment for MRSA infections. Test performance is not FDA approved in patients less than 58 years old. Performed at [facility], [address] [place], [place] [address]     Coagulation Studies: Recent Labs    03/13/22 0545  LABPROT 13.8  INR 1.1     Urinalysis: No results for input(s): "COLORURINE", "LABSPEC", "PHURINE", "GLUCOSEU", "HGBUR", "BILIRUBINUR", "KETONESUR", "PROTEINUR", "UROBILINOGEN", "NITRITE", "LEUKOCYTESUR" in the last 72 hours.  Invalid input(s): "APPERANCEUR"     Imaging: US BIOPSY (KIDNEY)  Result Date: 03/14/2022 INDICATION: AKI EXAM: Ultrasound-guided random renal core needle biopsy MEDICATIONS: None. ANESTHESIA/SEDATION: Moderate (conscious) sedation was employed during this procedure. A total of Versed 1 mg and Fentanyl 50 mcg  was administered intravenously by the radiology nurse. Total intra-service moderate Sedation Time: 15 minutes. The patient's level of consciousness and vital signs were monitored continuously by radiology nursing throughout the procedure under my direct supervision. COMPLICATIONS: None immediate. PROCEDURE: Informed written consent was obtained from the patient after a thorough discussion of the procedural risks, benefits and alternatives. All questions were addressed. Maximal Sterile Barrier Technique was utilized including caps, mask, sterile gowns, sterile gloves, sterile drape, hand hygiene and skin antiseptic. A timeout was performed prior to the initiation of the  procedure. The patient was placed prone on the exam table. Limited ultrasound of the bilateral flanks was performed for planning purposes. The left renal lower pole was selected as appropriate for percutaneous biopsy access. Skin entry site was marked, and the overlying skin was prepped and draped in the standard sterile fashion. Local analgesia was obtained with 1% lidocaine. Using ultrasound guidance, a 17 gauge introducer needle was advanced just deep to the cortex of the lower pole. Subsequently, core needle biopsy was performed using a 18 gauge core biopsy device x2 total passes. Specimens were submitted in saline to pathology for further handling. Gel-Foam slurry was administered through the introducer needle as it was removed to assist with hemostasis. Limited postprocedure imaging demonstrated expected post biopsy changes without hematoma. A clean dressing was placed. The patient tolerated the procedure well without immediate complication. IMPRESSION: Successful ultrasound-guided random renal core needle biopsy of the left renal lower pole. Electronically Signed   By: Albin Felling M.D.   On: 03/14/2022 13:45     Medications:    anticoagulant sodium citrate      acyclovir  200 mg Oral BID   amLODipine  10 mg Oral QPM   buprenorphine   1 patch Transdermal Weekly   Chlorhexidine Gluconate Cloth  6 each Topical Q0600   docusate sodium  100 mg Oral BID   fentaNYL       hydrALAZINE  50 mg Oral TID   insulin aspart  0-5 Units Subcutaneous QHS   insulin aspart  0-9 Units Subcutaneous TID WC   insulin glargine-yfgn  22 Units Subcutaneous Daily   isosorbide dinitrate  20 mg Oral TID   lidocaine  1 patch Transdermal Q24H   metoprolol tartrate  75 mg Oral BID   midazolam       polyethylene glycol  17 g Oral BID   rosuvastatin  10 mg Oral QHS   sodium bicarbonate  1,300 mg Oral BID   acetaminophen, albuterol, alteplase, anticoagulant sodium citrate, cyclobenzaprine, fentaNYL, heparin, hydrALAZINE, lidocaine (PF), lidocaine-prilocaine, LORazepam, melatonin, midazolam, nicotine, nitroGLYCERIN, pentafluoroprop-tetrafluoroeth, senna-docusate  Assessment/ Plan:  Cory Campbell is a 42 y.o.  male with past medical conditions including depression, HIV, hyperlipidemia, hypertension, BPH, diabetes who was admitted to [facility] on 03/02/2022 for Hyperkalemia [E87.5] Hypoglycemia [E16.2] Acute renal failure, unspecified acute renal failure type ([place]) [N17.9]   Acute kidney injury with proteinuria/hyperkalemia/hyponatremia.  Creatinine 5.05 on ED admission.  AKI appears multifactorial due to recent UTI infection, hypovolemia, and NSAID use.  NSAID use can cause glomular disease and FSGS, resulting in proteinuria.  Other differential includes HIV-associated nephropathy.  Patient also has underlying insulin-dependent diabetes.  Hemoglobin A1c 7.8% from 09/09/2021.   UPC ratio 3.95  on 03/04/2022.  Renal ultrasound negative for obstruction. Serologic workup complements-C3-C4 normal.  ANA negative,  anca, NEG. kappa /Lambda ration mildly elevated.   Baseline creatinine of 0.78 on November 28, 2021. Renal biopsy scheduled today. Creatinine down to 1.85 with adequate urine output. Will assess labs in am and determine renal recovery. Expect preliminary  results from biopsy in 24-48 hours.    Lab Results  Component Value Date   CREATININE 1.85 (H) 03/14/2022   CREATININE 2.73 (H) 03/12/2022   CREATININE 4.28 (H) 03/11/2022    Intake/Output Summary (Last 24 hours) at 03/14/2022 1415 Last data filed at 03/14/2022 1110 Gross per 24 hour  Intake --  Output 500 ml  Net -500 ml    2. Hypertension, essential. Home regimen includes amlodipine, clonidine, isosorbide, lisinopril and  metoprolol.  Continue amlodipine, hydralazine, isosorbide dinitrate. Blood pressure remains stable for this patient.     LOS: 12 [place] 2/6/20242:15 PM

## 2022-03-14 NOTE — Progress Notes (Signed)
Patient clinically stable post Renal biopsy per Dr Denna Haggard, tolerated well. Denies complaints at present. Received Versed 1 mg along with Fentanyl 50 mcg IV for procedure. Report given to Genelle Bal RN post procedure/bedside/332

## 2022-03-14 NOTE — Procedures (Signed)
Interventional Radiology Procedure Note  Date of Procedure: 03/14/2022  Procedure: US renal biopsy   Findings:  1. [place] random renal core needle biopsy Left renal lower pole 18ga x2 cores    Complications: No immediate complications noted.   Estimated Blood Loss: minimal  Follow-up and Recommendations: 1. Bedrest 6 hours    Albin Felling, MD  Vascular & Interventional Radiology  03/14/2022 12:12 PM

## 2022-03-15 DIAGNOSIS — I10 Essential (primary) hypertension: Secondary | ICD-10-CM | POA: Diagnosis not present

## 2022-03-15 DIAGNOSIS — E875 Hyperkalemia: Secondary | ICD-10-CM | POA: Diagnosis not present

## 2022-03-15 DIAGNOSIS — I952 Hypotension due to drugs: Secondary | ICD-10-CM | POA: Diagnosis not present

## 2022-03-15 DIAGNOSIS — B20 Human immunodeficiency virus [HIV] disease: Secondary | ICD-10-CM | POA: Diagnosis not present

## 2022-03-15 LAB — RENAL FUNCTION PANEL
Albumin: 3.5 g/dL (ref 3.5–5.0)
Anion gap: 11 (ref 5–15)
BUN: 44 mg/dL — ABNORMAL HIGH (ref 6–20)
CO2: 26 mmol/L (ref 22–32)
Calcium: 8.9 mg/dL (ref 8.9–10.3)
Chloride: 98 mmol/L (ref 98–111)
Creatinine, Ser: 2.52 mg/dL — ABNORMAL HIGH (ref 0.61–1.24)
GFR, Estimated: 32 mL/min — ABNORMAL LOW (ref >60)
Glucose, Bld: 256 mg/dL — ABNORMAL HIGH (ref 70–99)
Phosphorus: 4.3 mg/dL (ref 2.5–4.6)
Potassium: 2.9 mmol/L — ABNORMAL LOW (ref 3.5–5.1)
Sodium: 135 mmol/L (ref 135–145)

## 2022-03-15 LAB — CBC
HCT: 33.8 % — ABNORMAL LOW (ref 39.0–52.0)
Hemoglobin: 11.2 g/dL — ABNORMAL LOW (ref 13.0–17.0)
MCH: 27.4 pg (ref 26.0–34.0)
MCHC: 33.1 g/dL (ref 30.0–36.0)
MCV: 82.6 fL (ref 80.0–100.0)
Platelets: 404 10*3/uL — ABNORMAL HIGH (ref 150–400)
RBC: 4.09 MIL/uL — ABNORMAL LOW (ref 4.22–5.81)
RDW: 15.8 % — ABNORMAL HIGH (ref 11.5–15.5)
WBC: 11 10*3/uL — ABNORMAL HIGH (ref 4.0–10.5)
nRBC: 0 % (ref 0.0–0.2)

## 2022-03-15 LAB — GLUCOSE, CAPILLARY
Glucose-Capillary: 228 mg/dL — ABNORMAL HIGH (ref 70–99)
Glucose-Capillary: 247 mg/dL — ABNORMAL HIGH (ref 70–99)
Glucose-Capillary: 206 mg/dL — ABNORMAL HIGH (ref 70–99)
Glucose-Capillary: 259 mg/dL — ABNORMAL HIGH (ref 70–99)

## 2022-03-15 MED ORDER — POTASSIUM CHLORIDE 10 MEQ/100ML IV SOLN
10.0000 meq | INTRAVENOUS | Status: AC
  Administered 2022-03-15 (×3): 10 meq via INTRAVENOUS
  Filled 2022-03-15 (×3): qty 100

## 2022-03-15 MED ORDER — ONDANSETRON HCL 4 MG/2ML IJ SOLN
4.0000 mg | Freq: Once | INTRAMUSCULAR | Status: AC
  Administered 2022-03-15: 4 mg via INTRAVENOUS
  Filled 2022-03-15: qty 2

## 2022-03-15 MED ORDER — BICTEGRAVIR-EMTRICITAB-TENOFOV 50-200-25 MG PO TABS
1.0000 | ORAL_TABLET | Freq: Every day | ORAL | Status: DC
  Administered 2022-03-15 – 2022-03-16 (×2): 1 via ORAL
  Filled 2022-03-15 (×2): qty 1

## 2022-03-15 MED ORDER — TRAMADOL HCL 50 MG PO TABS
50.0000 mg | ORAL_TABLET | Freq: Four times a day (QID) | ORAL | Status: DC | PRN
  Administered 2022-03-15: 50 mg via ORAL
  Filled 2022-03-15 (×2): qty 1

## 2022-03-15 MED ORDER — TRAMADOL HCL 50 MG PO TABS
50.0000 mg | ORAL_TABLET | Freq: Once | ORAL | Status: AC
  Administered 2022-03-15: 50 mg via ORAL
  Filled 2022-03-15: qty 1

## 2022-03-15 NOTE — Progress Notes (Signed)
[facility] [place] [facility]  ROUNDING NOTE   Subjective:   Patient seen resting in bed Eating breakfast Complains of lower back pain Room air No lower extremity edema  Objective:  Vital signs in last 24 hours:  Temp:  [98 F (36.7 C)-99.6 F (37.6 C)] 98.2 F (36.8 C) (02/07 1158) Pulse Rate:  [78-102] 85 (02/07 1158) Resp:  [10-20] 20 (02/07 1158) BP: (106-133)/(87-107) 128/107 (02/07 1158) SpO2:  [100 %] 100 % (02/07 1158) FiO2 (%):  [21 %] 21 % (02/06 2250)  Weight change:  Filed Weights   03/09/22 1909 03/11/22 0519 03/14/22 1022  Weight: 67.1 kg 68.5 kg 68.5 kg    Intake/Output: I/O last 3 completed shifts: In: 480 [P.O.:480] Out: 500 [Urine:500]   Intake/Output this shift:  Total I/O In: 240 [P.O.:240] Out: 300 [Urine:300]  Physical Exam: General: NAD  Head: Normocephalic, atraumatic. Moist oral mucosal membranes  Eyes: Anicteric,  Lungs:  Clear to auscultation, normal effort  Heart: Regular rate and rhythm  Abdomen:  Soft, nontender  Extremities: No lower extremity edema  Neurologic: Alert and oriented  Skin: No lesions  Access:  Left chest PermCath placed on 5/0/9326    Basic Metabolic Panel: Recent Labs  Lab 03/10/22 0906 03/11/22 1521 03/12/22 0625 03/14/22 0438 03/15/22 0437  NA 135 135 138 137 135  K 3.1* 3.2* 3.3* 3.0* 2.9*  CL 99 99 99 98 98  CO2 22 23 29 27 26   GLUCOSE 202* 190* 221* 225* 256*  BUN 41* 53* 33* 27* 44*  CREATININE 4.36* 4.28* 2.73* 1.85* 2.52*  CALCIUM 8.8* 9.1 9.1 8.9 8.9  PHOS 3.5  --   --   --  4.3     Liver Function Tests: Recent Labs  Lab 03/10/22 0906 03/15/22 0437  ALBUMIN 3.5 3.5    No results for input(s): "LIPASE", "AMYLASE" in the last 168 hours. No results for input(s): "AMMONIA" in the last 168 hours.  CBC: Recent Labs  Lab 03/09/22 0357 03/11/22 1521 03/12/22 0625 03/14/22 0438 03/15/22 0437  WBC 5.5 7.2 7.8 6.3 11.0*  NEUTROABS  --   --   --  3.9  --   HGB 13.3 12.7* 13.1 12.1*  11.2*  HCT 39.4 38.2* 39.5 36.1* 33.8*  MCV 79.1* 79.7* 80.4 81.1 82.6  PLT 239 340 335 403* 404*     Cardiac Enzymes: No results for input(s): "CKTOTAL", "CKMB", "CKMBINDEX", "TROPONINI" in the last 168 hours.  BNP: Invalid input(s): "POCBNP"  CBG: Recent Labs  Lab 03/14/22 1406 03/14/22 1600 03/14/22 2156 03/15/22 0729 03/15/22 1151  GLUCAP 166* 291* 385* 228* 247*     Microbiology: Results for orders placed or performed during the hospital encounter of 03/02/22  Culture, blood (Routine X 2) w Reflex to ID Panel     Status: None   Collection Time: 03/02/22  2:15 PM   Specimen: BLOOD  Result Value Ref Range Status   Specimen Description BLOOD LEFT ANTECUBITAL  Final   Special Requests   Final    BOTTLES DRAWN AEROBIC AND ANAEROBIC Blood Culture adequate volume   Culture   Final    NO GROWTH 5 DAYS Performed at [facility], [address], [place], [place] [address]    Report Status 03/07/2022 FINAL  Final  Culture, blood (Routine X 2) w Reflex to ID Panel     Status: None   Collection Time: 03/02/22  2:20 PM   Specimen: BLOOD  Result Value Ref Range Status   Specimen Description BLOOD RIGHT  ANTECUBITAL  Final   Special Requests   Final    BOTTLES DRAWN AEROBIC AND ANAEROBIC Blood Culture adequate volume   Culture   Final    NO GROWTH 5 DAYS Performed at [facility], [address] [place], [place] [address]    Report Status 03/07/2022 FINAL  Final  Urine Culture (for pregnant, neutropenic or urologic patients or patients with an indwelling urinary catheter)     Status: Abnormal   Collection Time: 03/02/22  3:53 PM   Specimen: Urine, Clean Catch  Result Value Ref Range Status   Specimen Description   Final    URINE, CLEAN CATCH Performed at [facility], [address], [place], [place] [address]    Special Requests   Final    NONE Performed at [facility], [address], [place], [place] [address]    Culture  (A)  Final    >=100,000 COLONIES/mL ENTEROCOCCUS FAECALIS >=100,000 COLONIES/mL LACTOBACILLUS SPECIES Standardized susceptibility testing for this organism is not available. Performed at [facility], [place] [address], [place], [place] [address]    Report Status 03/05/2022 FINAL  Final   Organism ID, Bacteria ENTEROCOCCUS FAECALIS (A)  Final      Susceptibility   Enterococcus faecalis - MIC*    AMPICILLIN <=2 SENSITIVE Sensitive     NITROFURANTOIN <=16 SENSITIVE Sensitive     VANCOMYCIN 1 SENSITIVE Sensitive     * >=100,000 COLONIES/mL ENTEROCOCCUS FAECALIS  MRSA Next Gen by PCR, Nasal     Status: None   Collection Time: 03/03/22  6:05 AM   Specimen: Nasal Mucosa; Nasal Swab  Result Value Ref Range Status   MRSA by PCR Next Gen NOT DETECTED NOT DETECTED Final    Comment: (NOTE) The GeneXpert MRSA Assay (FDA approved for NASAL specimens only), is one component of a comprehensive MRSA colonization surveillance program. It is not intended to diagnose MRSA infection nor to guide or monitor treatment for MRSA infections. Test performance is not FDA approved in patients less than 10 years old. Performed at [facility], [address] [place], [place] [address]     Coagulation Studies: Recent Labs    03/13/22 0545  LABPROT 13.8  INR 1.1     Urinalysis: No results for input(s): "COLORURINE", "LABSPEC", "PHURINE", "GLUCOSEU", "HGBUR", "BILIRUBINUR", "KETONESUR", "PROTEINUR", "UROBILINOGEN", "NITRITE", "LEUKOCYTESUR" in the last 72 hours.  Invalid input(s): "APPERANCEUR"     Imaging: US BIOPSY (KIDNEY)  Result Date: 03/14/2022 INDICATION: AKI EXAM: Ultrasound-guided random renal core needle biopsy MEDICATIONS: None. ANESTHESIA/SEDATION: Moderate (conscious) sedation was employed during this procedure. A total of Versed 1 mg and Fentanyl 50 mcg was administered intravenously by the radiology nurse. Total intra-service moderate Sedation Time: 15 minutes. The  patient's level of consciousness and vital signs were monitored continuously by radiology nursing throughout the procedure under my direct supervision. COMPLICATIONS: None immediate. PROCEDURE: Informed written consent was obtained from the patient after a thorough discussion of the procedural risks, benefits and alternatives. All questions were addressed. Maximal Sterile Barrier Technique was utilized including caps, mask, sterile gowns, sterile gloves, sterile drape, hand hygiene and skin antiseptic. A timeout was performed prior to the initiation of the procedure. The patient was placed prone on the exam table. Limited ultrasound of the bilateral flanks was performed for planning purposes. The left renal lower pole was selected as appropriate for percutaneous biopsy access. Skin entry site was marked, and the overlying skin was prepped and draped in the standard sterile fashion. Local  analgesia was obtained with 1% lidocaine. Using ultrasound guidance, a 17 gauge introducer needle was advanced just deep to the cortex of the lower pole. Subsequently, core needle biopsy was performed using a 18 gauge core biopsy device x2 total passes. Specimens were submitted in saline to pathology for further handling. Gel-Foam slurry was administered through the introducer needle as it was removed to assist with hemostasis. Limited postprocedure imaging demonstrated expected post biopsy changes without hematoma. A clean dressing was placed. The patient tolerated the procedure well without immediate complication. IMPRESSION: Successful ultrasound-guided random renal core needle biopsy of the left renal lower pole. Electronically Signed   By: Albin Felling M.D.   On: 03/14/2022 13:45     Medications:    anticoagulant sodium citrate      acyclovir  200 mg Oral BID   amLODipine  10 mg Oral QPM   bictegravir-emtricitabine-tenofovir AF  1 tablet Oral Q breakfast   buprenorphine  1 patch Transdermal Weekly   Chlorhexidine  Gluconate Cloth  6 each Topical Q0600   docusate sodium  100 mg Oral BID   hydrALAZINE  50 mg Oral TID   insulin aspart  0-5 Units Subcutaneous QHS   insulin aspart  0-9 Units Subcutaneous TID WC   insulin glargine-yfgn  22 Units Subcutaneous Daily   isosorbide dinitrate  20 mg Oral TID   lidocaine  1 patch Transdermal Q24H   metoprolol tartrate  75 mg Oral BID   polyethylene glycol  17 g Oral BID   rosuvastatin  10 mg Oral QHS   sodium bicarbonate  1,300 mg Oral BID   acetaminophen, albuterol, alteplase, anticoagulant sodium citrate, cyclobenzaprine, heparin, hydrALAZINE, lidocaine (PF), lidocaine-prilocaine, LORazepam, melatonin, nicotine, nitroGLYCERIN, pentafluoroprop-tetrafluoroeth, senna-docusate  Assessment/ Plan:  Cory Campbell is a 42 y.o.  male with past medical conditions including depression, HIV, hyperlipidemia, hypertension, BPH, diabetes who was admitted to [facility] on 03/02/2022 for Hyperkalemia [E87.5] Hypoglycemia [E16.2] Acute renal failure, unspecified acute renal failure type ([place]) [N17.9]   Acute kidney injury with proteinuria/hyperkalemia/hyponatremia.  Creatinine 5.05 on ED admission.  AKI appears multifactorial due to recent UTI infection, hypovolemia, and NSAID use.  NSAID use can cause glomular disease and FSGS, resulting in proteinuria.  Other differential includes HIV-associated nephropathy.  Patient also has underlying insulin-dependent diabetes.  Hemoglobin A1c 7.8% from 09/09/2021.   UPC ratio 3.95  on 03/04/2022.  Renal ultrasound negative for obstruction. Serologic workup complements-C3-C4 normal.  ANA negative,  anca, NEG. kappa /Lambda ration mildly elevated.   Baseline creatinine of 0.78 on November 28, 2021. Renal biopsy completed yesterday, expect preliminary results from biopsy in 24-48 hours. Creatinine slightly elevated today. Will assess labs in am to determine renal recovery.    Lab Results  Component Value Date   CREATININE 2.52 (H) 03/15/2022    CREATININE 1.85 (H) 03/14/2022   CREATININE 2.73 (H) 03/12/2022    Intake/Output Summary (Last 24 hours) at 03/15/2022 1351 Last data filed at 03/15/2022 0900 Gross per 24 hour  Intake 720 ml  Output 300 ml  Net 420 ml    2. Hypertension, essential. Home regimen includes amlodipine, clonidine, isosorbide, lisinopril and metoprolol.  Continue amlodipine, hydralazine, isosorbide dinitrate. Blood pressure 133/93    LOS: 13 [place] 2/7/20241:51 PM

## 2022-03-15 NOTE — Inpatient Diabetes Management (Signed)
Inpatient Diabetes Program Recommendations  AACE/ADA: New Consensus Statement on Inpatient Glycemic Control   Target Ranges:  Prepandial:   less than 140 mg/dL      Peak postprandial:   less than 180 mg/dL (1-2 hours)      Critically ill patients:  140 - 180 mg/dL    Latest Reference Range & Units 03/14/22 07:42 03/14/22 14:06 03/14/22 16:00 03/14/22 21:56 03/15/22 07:29 03/15/22 11:51  Glucose-Capillary 70 - 99 mg/dL 178 (H) 166 (H) 291 (H) 385 (H) 228 (H) 247 (H)   Review of Glycemic Control  Diabetes history: DM2 Outpatient Diabetes medications: Tresiba 366 units BID, Trulicity 4.5 mg Qweek, Metformin 1000 mg BID, Jardiance 25 mg daily Current orders for Inpatient glycemic control: Semglee 22 units daily, Novolog 0-9 units TID with meals, Novolog 0-5 units QHS  Inpatient Diabetes Program Recommendations:    Insulin: Please consider increasing Semglee to 25 units daily and ordering Novolog 3 units TID with meals for meal coverage if patient eats at least 50% of meals.  Thanks, Barnie Alderman, RN, MSN, [place] [facility] Coordinator Inpatient Diabetes Program [phone] (Team Pager from 8am to [place])

## 2022-03-15 NOTE — Progress Notes (Signed)
Progress Note    03/15/2022 10:07 AM 5 Days Post-Op  Subjective:  [name] is a 42 year old male with his of arthrogryposis multiplex congenita [facility]), walks with crutches at baseline, CKD4, depression, HIV on Biktarvy who presented to the emergency room on 1/25 with hypoglycemia and noted to have hypotension.  Noted to have hyperkalemia and AKI and urinary tract infection.  Nephrology consulted workup underway as well as discussion to start dialysis.  Vascular surgery saw patient and placed temporary dialysis catheter on 1/27.  After several sessions of hemodialysis, plan is for renal biopsy which unable to do until 2/5 due to patient being on aspirin.  Showing some signs of improvement in creatinine, although still needs hemodialysis.  Vascular surgery placed dialysis [facility] Cath 2/2.   Temp cath removed from right chest without complication. Perm cath dialysis cath placed in left chest.       Vitals:   03/15/22 0354 03/15/22 0733  BP: (!) 128/106 (!) 133/93  Pulse: 78 86  Resp: 20 18  Temp: 98 F (36.7 C) 98.7 F (37.1 C)  SpO2: 100% 100%   Physical Exam: Cardiac:  Regular rate and rhythm, S1 and S2   Lungs:   Clear to auscultation bilaterally   Incisions:  Right Chest post temp cath removal C/D/I. Left Chest new perm cath dressing C/D/I no hematoma or seroma to note.   Extremities:  Palpable pulses throughout  Abdomen:   Soft nontender, nondistended, positive bowel sounds  Neurologic: Alert and oriented x 2, no acute distress    CBC    Component Value Date/Time   WBC 11.0 (H) 03/15/2022 0437   RBC 4.09 (L) 03/15/2022 0437   HGB 11.2 (L) 03/15/2022 0437   HGB 9.2 (L) 05/16/2017 1521   HCT 33.8 (L) 03/15/2022 0437   HCT 28.3 (L) 05/16/2017 1521   PLT 404 (H) 03/15/2022 0437   PLT 798 (H) 05/16/2017 1521   MCV 82.6 03/15/2022 0437   MCV 85 05/16/2017 1521   MCV 85 08/28/2013 0024   MCH 27.4 03/15/2022 0437   MCHC 33.1 03/15/2022 0437   RDW 15.8 (H) 03/15/2022  0437   RDW 13.8 05/16/2017 1521   RDW 13.1 08/28/2013 0024   LYMPHSABS 1.6 03/14/2022 0438   LYMPHSABS 0.7 05/16/2017 1521   MONOABS 0.5 03/14/2022 0438   EOSABS 0.1 03/14/2022 0438   EOSABS 0.0 05/16/2017 1521   BASOSABS 0.1 03/14/2022 0438   BASOSABS 0.0 05/16/2017 1521    BMET    Component Value Date/Time   NA 135 03/15/2022 0437   NA 139 07/07/2020 0844   NA 134 (L) 08/28/2013 0024   K 2.9 (L) 03/15/2022 0437   K 2.9 (L) 08/28/2013 0024   CL 98 03/15/2022 0437   CL 96 (L) 08/28/2013 0024   CO2 26 03/15/2022 0437   CO2 28 08/28/2013 0024   GLUCOSE 256 (H) 03/15/2022 0437   GLUCOSE 331 (H) 08/28/2013 0024   BUN 44 (H) 03/15/2022 0437   BUN 16 07/07/2020 0844   BUN 8 08/28/2013 0024   CREATININE 2.52 (H) 03/15/2022 0437   CREATININE 0.70 08/28/2013 0024   CALCIUM 8.9 03/15/2022 0437   CALCIUM 7.8 (L) 08/28/2013 0024   GFRNONAA 32 (L) 03/15/2022 0437   GFRNONAA >60 08/28/2013 0024   GFRAA >60 09/15/2018 1835   GFRAA >60 08/28/2013 0024    INR    Component Value Date/Time   INR 1.1 03/13/2022 0545   INR 0.9 08/28/2013 0024  Intake/Output Summary (Last 24 hours) at 03/15/2022 1007 Last data filed at 03/15/2022 0900 Gross per 24 hour  Intake 720 ml  Output 500 ml  Net 220 ml     Assessment/Plan:  42 y.o. male is s/p Dialysis [facility] Cath placement.  5 Days Post-Op   Per vascular perm cath working well. No signs or symptoms of infection and no hematoma or seroma to note.  Will sign off. If needed again please consult.    DVT prophylaxis:  Heparin 5000 units SQ QD    Drema [facility] 03/15/2022 10:07 AM

## 2022-03-15 NOTE — Progress Notes (Signed)
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  Noted to have hyperkalemia and AKI and urinary tract infection.  Nephrology consulted workup underway as well as discussion to start dialysis.  Vascular surgery saw patient and placed temporary dialysis catheter on 1/27.  After several sessions of hemodialysis, patient showing some signs of improvement in creatinine, although still needs hemodialysis.  Vascular surgery placed PermCath placement 2/2.  Interventional radiology plans to take patient on 2/6 for renal biopsy.  2/6: Vitals stable.  Had his dialysis yesterday.  Going for renal biopsy today.  If remains stable then can be discharged home tomorrow per nephrology.  2/7; vitals with mildly elevated blood pressure at 133/93, labs with leukocytosis, most likely reactive with renal biopsy yesterday.  Potassium at 2.9 which was repleted by nephrology. Nephrology would like to continue monitoring for 1 more day and holding dialysis at this time.   Assessment and Plan: * Hyperkalemia-resolved Secondary to worsening renal failure.  Status post Lokelma.  Metabolic acidosis Secondary to uremia  AKI (acute kidney injury) ([place]) in the setting of stage IV chronic kidney disease - Requiring dialysis.  Workup in progress, although suspecting NSAID use.  Getting intermittent dialysis.  Vascular surgery placed PermCath on 2/2.  Patient being set up with outpatient dialysis for now.  Renal biopsy initially delayed due to patient being on aspirin, but now following 5 days of clearance,  Had renal biopsy with IR on  2/6. Nephrology continue to monitor without dialysis for 1-2 more days before making decision regarding continuation.  UTI - Completed antibiotic course  HIV (human immunodeficiency virus infection) ([place]) - May need to change medication from [facility] if creatinine clearance does not improve.  Currently Biktarvy on hold  Essential hypertension Home medications resumed with additional medications added.  Blood pressure still elevated  Tobacco dependence - As needed nicotine patch ordered  Hyponatremia - Secondary to hypervolemia.  Sodium now normalized  Weakness - Multifactorial  Seizure disorder (HCC) - Ativan 2 mg IV as needed for seizure, 2 doses ordered  Frequent falls - Fall precaution  Sleep apnea - CPAP nightly ordered  Insulin dependent type 2 diabetes mellitus (HCC) - Insulin SSI with at bedtime coverage ordered CBGs continuing to trend upward, so continue to titrate up long-acting insulin dose, but still nowhere near patient's home dose.  Also with sliding scale.   Body mass index is 24.37 kg/m.        Consultants: Vascular surgery Interventional radiology Nephrology  Procedures: Placement of dialysis catheter PermCath placement 2/2 Renal biopsy  2/6  Antimicrobials: Completed course of Rocephin/Augmentin for UTI  Code Status: Full code   Subjective: Patient with no new complaints.  He wants to go home  Objective: Vital signs were reviewed and unremarkable. Vitals:   03/15/22 1158 03/15/22 1509  BP: (!) 128/107 (!) 138/105  Pulse: 85 99  Resp: 20 18  Temp: 98.2 F (36.8 C) 98.4 F (36.9 C)  SpO2: 100% 100%    Intake/Output Summary (Last 24 hours) at 03/15/2022 1513 Last data filed at 03/15/2022 1459 Gross per 24 hour  Intake 720 ml  Output 450 ml  Net 270 ml    Filed Weights   03/09/22 1909 03/11/22 0519 03/14/22 1022  Weight: 67.1 kg 68.5 kg 68.5 kg   Body mass index is 24.37 kg/m.  Exam:  General.  Ill-appearing gentleman,  in no acute distress. Pulmonary.  Lungs clear bilaterally, normal respiratory effort. CV.  Regular rate and rhythm, no JVD, rub or murmur. Abdomen.  Soft, nontender, nondistended, BS positive. CNS.  Alert and oriented .  No focal neurologic deficit. Extremities.  No edema, no cyanosis, pulses intact and symmetrical. Psychiatry.  Judgment and insight appears normal.   Data Reviewed: Prior data reviewed  Disposition:  Status is: Inpatient Remains inpatient appropriate because:  -Renal biopsy    Anticipated discharge date: 2/7  Family Communication: Discussed with wife on phone  DVT Prophylaxis: Place TED hose Start: 03/02/22 1312  This record has been created using Systems analyst. Errors have been sought and corrected,but may not always be located. Such creation errors do not reflect on the standard of care.   Author: Lorella Nimrod ,MD 03/15/2022 3:13 PM  To reach On-call, see care teams to locate the attending and reach out via [email or web address]. Between 7PM-7AM, please contact night-coverage If you still have difficulty reaching the attending provider, please page the [facility] (Director on Call) for [facility] on amion for assistance.

## 2022-03-15 NOTE — Progress Notes (Signed)
       CROSS COVER NOTE  NAME: ALDOUS HOUSEL MRN: [id number] DOB : October 15, 1980 ATTENDING PHYSICIAN: Lorella Nimrod, MD    Date of Service   03/15/2022   HPI/Events of Note   Medication request received for chronic back pain refractory to tylenol and flexeril  Interventions   Assessment/Plan:  Tramadol      To reach the provider On-Call:   7AM- 7PM see care teams to locate the attending and reach out to them via [email or web address] Password: [email or web address] 7PM-7AM contact night-coverage If you still have difficulty reaching the appropriate provider, please page the [facility] (Director on Call) for [facility] on amion for assistance  This document was prepared using Systems analyst and may include unintentional dictation errors.  Neomia Glass DNP, MBA, FNP-BC, PMHNP-BC Nurse Practitioner [facility] Pager [phone]

## 2022-03-15 NOTE — Plan of Care (Signed)
  Problem: Education: Goal: Ability to describe self-care measures that may prevent or decrease complications (Diabetes Survival Skills Education) will improve Outcome: Progressing   Problem: Coping: Goal: Ability to adjust to condition or change in health will improve Outcome: Progressing   Problem: Nutritional: Goal: Maintenance of adequate nutrition will improve Outcome: Progressing   Problem: Education: Goal: Knowledge of General Education information will improve Description: Including pain rating scale, medication(s)/side effects and non-pharmacologic comfort measures Outcome: Progressing   

## 2022-03-16 DIAGNOSIS — E875 Hyperkalemia: Secondary | ICD-10-CM | POA: Diagnosis not present

## 2022-03-16 LAB — RENAL FUNCTION PANEL
Albumin: 3.2 g/dL — ABNORMAL LOW (ref 3.5–5.0)
Anion gap: 11 (ref 5–15)
BUN: 41 mg/dL — ABNORMAL HIGH (ref 6–20)
CO2: 26 mmol/L (ref 22–32)
Calcium: 9 mg/dL (ref 8.9–10.3)
Chloride: 98 mmol/L (ref 98–111)
Creatinine, Ser: 2.57 mg/dL — ABNORMAL HIGH (ref 0.61–1.24)
GFR, Estimated: 31 mL/min — ABNORMAL LOW (ref >60)
Glucose, Bld: 252 mg/dL — ABNORMAL HIGH (ref 70–99)
Phosphorus: 4.7 mg/dL — ABNORMAL HIGH (ref 2.5–4.6)
Potassium: 3.3 mmol/L — ABNORMAL LOW (ref 3.5–5.1)
Sodium: 135 mmol/L (ref 135–145)

## 2022-03-16 LAB — GLUCOSE, CAPILLARY
Glucose-Capillary: 205 mg/dL — ABNORMAL HIGH (ref 70–99)
Glucose-Capillary: 273 mg/dL — ABNORMAL HIGH (ref 70–99)

## 2022-03-16 MED ORDER — INSULIN ASPART 100 UNIT/ML IJ SOLN
3.0000 [IU] | Freq: Three times a day (TID) | INTRAMUSCULAR | Status: DC
  Administered 2022-03-16 (×2): 3 [IU] via SUBCUTANEOUS
  Filled 2022-03-16 (×2): qty 1

## 2022-03-16 MED ORDER — ACYCLOVIR 200 MG PO CAPS: 200.0000 mg | ORAL_CAPSULE | Freq: Two times a day (BID) | ORAL | 30 refills | Status: DC

## 2022-03-16 MED ORDER — ISOSORBIDE DINITRATE 20 MG PO TABS: 20.0000 mg | ORAL_TABLET | Freq: Three times a day (TID) | ORAL | 1 refills | Status: DC

## 2022-03-16 MED ORDER — POTASSIUM CHLORIDE 20 MEQ PO PACK
40.0000 meq | PACK | Freq: Once | ORAL | Status: AC
  Administered 2022-03-16: 40 meq via ORAL
  Filled 2022-03-16: qty 2

## 2022-03-16 MED ORDER — INSULIN GLARGINE-YFGN 100 UNIT/ML ~~LOC~~ SOLN
25.0000 [IU] | Freq: Every day | SUBCUTANEOUS | Status: DC
  Administered 2022-03-16: 25 [IU] via SUBCUTANEOUS
  Filled 2022-03-16: qty 0.25

## 2022-03-16 MED ORDER — HYDRALAZINE HCL 50 MG PO TABS: 50.0000 mg | ORAL_TABLET | Freq: Three times a day (TID) | ORAL | 1 refills | Status: DC

## 2022-03-16 MED ORDER — METOPROLOL TARTRATE 75 MG PO TABS: 75.0000 mg | ORAL_TABLET | Freq: Two times a day (BID) | ORAL | 1 refills | Status: DC

## 2022-03-16 NOTE — Discharge Instructions (Addendum)
Advised to follow-up with primary care physician in 1 week. Advised to follow-up with the [facility] nephrology as scheduled. Patient to be discharged home with testio catheter , may need  hemodialysis therapy as an outpatient.

## 2022-03-16 NOTE — TOC Transition Note (Signed)
Transition of Care [facility]) - CM/SW Discharge Note   Patient Details  Name: Cory Campbell MRN: [id number] Date of Birth: [birth year]  [facility] of [facility]) CM/SW Contact:  Candie Chroman, LCSW Phone Number: 03/16/2022, 1:21 PM   Clinical Narrative:   Patient has orders to discharge home today. EMS transport has been arranged and they said he's 2nd or 3rd on the list. No further concerns. CSW signing off.  Final next level of care: Home/Self Care Barriers to Discharge: Barriers Resolved   Patient Goals and CMS Choice      Discharge Placement                  Patient to be transferred to facility by: EMS Name of family member notified: Achilles Dunk Patient and family notified of of transfer: 03/16/22  Discharge Plan and Services Additional resources added to the After Visit Summary for                                       Social Determinants of Health (SDOH) Interventions SDOH Screenings   Food Insecurity: No Food Insecurity (11/24/2021)  Housing: Low Risk  (11/24/2021)  Transportation Needs: No Transportation Needs (11/24/2021)  Utilities: Not At Risk (11/24/2021)  Tobacco Use: High Risk (03/13/2022)     Readmission Risk Interventions    03/03/2022    4:01 PM 09/11/2021   10:27 AM  Readmission Risk Prevention Plan  Transportation Screening Complete Complete  PCP or Specialist Appt within 3-5 Days  Complete  HRI or [place]  Complete  Social Work Consult for [place] Planning/Counseling  Complete  Palliative Care Screening  Not Applicable  Medication Review Press photographer) Complete Complete  PCP or Specialist appointment within 3-5 days of discharge Complete   HRI or [place] Complete   SW Recovery Care/Counseling Consult Complete   [place] Not Applicable

## 2022-03-16 NOTE — Progress Notes (Signed)
[facility]  ROUNDING NOTE   Subjective:   Patient seen laying in bed Alert and oriented Appetite remains poor but improving, denies nausea and vomiting Remains on room air  Creatinine 2.57 UOP 913ml  Objective:  Vital signs in last 24 hours:  Temp:  [97.9 F (36.6 C)-99.5 F (37.5 C)] 99.5 F (37.5 C) (02/08 1149) Pulse Rate:  [96-127] 96 (02/08 1149) Resp:  [14-18] 14 (02/08 1149) BP: (95-138)/(65-105) 95/73 (02/08 1149) SpO2:  [97 %-100 %] 100 % (02/08 1149) FiO2 (%):  [21 %] 21 % (02/07 2255)  Weight change:  Filed Weights   03/09/22 1909 03/11/22 0519 03/14/22 1022  Weight: 67.1 kg 68.5 kg 68.5 kg    Intake/Output: I/O last 3 completed shifts: In: 720 [P.O.:720] Out: 950 [Urine:950]   Intake/Output this shift:  Total I/O In: 360 [P.O.:240; Other:120] Out: 250 [Urine:250]  Physical Exam: General: NAD  Head: Normocephalic, atraumatic. Moist oral mucosal membranes  Eyes: Anicteric  Lungs:  Clear to auscultation, normal effort  Heart: Regular rate and rhythm  Abdomen:  Soft, nontender  Extremities: No lower extremity edema  Neurologic: Alert and oriented  Skin: No lesions  Access:  Left chest PermCath placed on 02/14/2991    Basic Metabolic Panel: Recent Labs  Lab 03/10/22 0906 03/11/22 1521 03/12/22 0625 03/14/22 0438 03/15/22 0437 03/16/22 0212  NA 135 135 138 137 135 135  K 3.1* 3.2* 3.3* 3.0* 2.9* 3.3*  CL 99 99 99 98 98 98  CO2 22 23 29 27 26 26   GLUCOSE 202* 190* 221* 225* 256* 252*  BUN 41* 53* 33* 27* 44* 41*  CREATININE 4.36* 4.28* 2.73* 1.85* 2.52* 2.57*  CALCIUM 8.8* 9.1 9.1 8.9 8.9 9.0  PHOS 3.5  --   --   --  4.3 4.7*     Liver Function Tests: Recent Labs  Lab 03/10/22 0906 03/15/22 0437 03/16/22 0212  ALBUMIN 3.5 3.5 3.2*    No results for input(s): "LIPASE", "AMYLASE" in the last 168 hours. No results for input(s): "AMMONIA" in the last 168 hours.  CBC: Recent Labs  Lab 03/11/22 1521 03/12/22 0625  03/14/22 0438 03/15/22 0437  WBC 7.2 7.8 6.3 11.0*  NEUTROABS  --   --  3.9  --   HGB 12.7* 13.1 12.1* 11.2*  HCT 38.2* 39.5 36.1* 33.8*  MCV 79.7* 80.4 81.1 82.6  PLT 340 335 403* 404*     Cardiac Enzymes: No results for input(s): "CKTOTAL", "CKMB", "CKMBINDEX", "TROPONINI" in the last 168 hours.  BNP: Invalid input(s): "POCBNP"  CBG: Recent Labs  Lab 03/15/22 1151 03/15/22 1508 03/15/22 2035 03/16/22 0827 03/16/22 1158  GLUCAP 247* 206* 259* 205* 66*     Microbiology: Results for orders placed or performed during the hospital encounter of 03/02/22  Culture, blood (Routine X 2) w Reflex to ID Panel     Status: None   Collection Time: 03/02/22  2:15 PM   Specimen: BLOOD  Result Value Ref Range Status   Specimen Description BLOOD LEFT ANTECUBITAL  Final   Special Requests   Final    BOTTLES DRAWN AEROBIC AND ANAEROBIC Blood Culture adequate volume   Culture   Final    NO GROWTH 5 DAYS Performed at [facility], [address], [place], [place] [address]    Report Status 03/07/2022 FINAL  Final  Culture, blood (Routine X 2) w Reflex to ID Panel     Status: None   Collection Time: 03/02/22  2:20 PM  Specimen: BLOOD  Result Value Ref Range Status   Specimen Description BLOOD RIGHT ANTECUBITAL  Final   Special Requests   Final    BOTTLES DRAWN AEROBIC AND ANAEROBIC Blood Culture adequate volume   Culture   Final    NO GROWTH 5 DAYS Performed at [facility], [address] [place], [place] [address]    Report Status 03/07/2022 FINAL  Final  Urine Culture (for pregnant, neutropenic or urologic patients or patients with an indwelling urinary catheter)     Status: Abnormal   Collection Time: 03/02/22  3:53 PM   Specimen: Urine, Clean Catch  Result Value Ref Range Status   Specimen Description   Final    URINE, CLEAN CATCH Performed at [facility], [address] [place], [place] [address]    Special Requests   Final     NONE Performed at [facility], [address] [place], [place] [address]    Culture (A)  Final    >=100,000 COLONIES/mL ENTEROCOCCUS FAECALIS >=100,000 COLONIES/mL LACTOBACILLUS SPECIES Standardized susceptibility testing for this organism is not available. Performed at [facility], [place] [address] [place], [place] [address]    Report Status 03/05/2022 FINAL  Final   Organism ID, Bacteria ENTEROCOCCUS FAECALIS (A)  Final      Susceptibility   Enterococcus faecalis - MIC*    AMPICILLIN <=2 SENSITIVE Sensitive     NITROFURANTOIN <=16 SENSITIVE Sensitive     VANCOMYCIN 1 SENSITIVE Sensitive     * >=100,000 COLONIES/mL ENTEROCOCCUS FAECALIS  MRSA Next Gen by PCR, Nasal     Status: None   Collection Time: 03/03/22  6:05 AM   Specimen: Nasal Mucosa; Nasal Swab  Result Value Ref Range Status   MRSA by PCR Next Gen NOT DETECTED NOT DETECTED Final    Comment: (NOTE) The GeneXpert MRSA Assay (FDA approved for NASAL specimens only), is one component of a comprehensive MRSA colonization surveillance program. It is not intended to diagnose MRSA infection nor to guide or monitor treatment for MRSA infections. Test performance is not FDA approved in patients less than 63 years old. Performed at [facility], [address] [place], [place] [address]     Coagulation Studies: No results for input(s): "LABPROT", "INR" in the last 72 hours.   Urinalysis: No results for input(s): "COLORURINE", "LABSPEC", "PHURINE", "GLUCOSEU", "HGBUR", "BILIRUBINUR", "KETONESUR", "PROTEINUR", "UROBILINOGEN", "NITRITE", "LEUKOCYTESUR" in the last 72 hours.  Invalid input(s): "APPERANCEUR"     Imaging: US BIOPSY (KIDNEY)  Result Date: 03/14/2022 INDICATION: AKI EXAM: Ultrasound-guided random renal core needle biopsy MEDICATIONS: None. ANESTHESIA/SEDATION: Moderate (conscious) sedation was employed during this procedure. A total of Versed 1 mg and Fentanyl 50 mcg was administered  intravenously by the radiology nurse. Total intra-service moderate Sedation Time: 15 minutes. The patient's level of consciousness and vital signs were monitored continuously by radiology nursing throughout the procedure under my direct supervision. COMPLICATIONS: None immediate. PROCEDURE: Informed written consent was obtained from the patient after a thorough discussion of the procedural risks, benefits and alternatives. All questions were addressed. Maximal Sterile Barrier Technique was utilized including caps, mask, sterile gowns, sterile gloves, sterile drape, hand hygiene and skin antiseptic. A timeout was performed prior to the initiation of the procedure. The patient was placed prone on the exam table. Limited ultrasound of the bilateral flanks was performed for planning purposes. The left renal lower pole was selected as appropriate for percutaneous biopsy access. Skin entry site was marked, and the overlying skin  was prepped and draped in the standard sterile fashion. Local analgesia was obtained with 1% lidocaine. Using ultrasound guidance, a 17 gauge introducer needle was advanced just deep to the cortex of the lower pole. Subsequently, core needle biopsy was performed using a 18 gauge core biopsy device x2 total passes. Specimens were submitted in saline to pathology for further handling. Gel-Foam slurry was administered through the introducer needle as it was removed to assist with hemostasis. Limited postprocedure imaging demonstrated expected post biopsy changes without hematoma. A clean dressing was placed. The patient tolerated the procedure well without immediate complication. IMPRESSION: Successful ultrasound-guided random renal core needle biopsy of the left renal lower pole. Electronically Signed   By: Olive Bass M.D.   On: 03/14/2022 13:45     Medications:    anticoagulant sodium citrate      acyclovir  200 mg Oral BID   amLODipine  10 mg Oral QPM    bictegravir-emtricitabine-tenofovir AF  1 tablet Oral Q breakfast   buprenorphine  1 patch Transdermal Weekly   Chlorhexidine Gluconate Cloth  6 each Topical Q0600   docusate sodium  100 mg Oral BID   hydrALAZINE  50 mg Oral TID   insulin aspart  0-5 Units Subcutaneous QHS   insulin aspart  0-9 Units Subcutaneous TID WC   insulin aspart  3 Units Subcutaneous TID WC   insulin glargine-yfgn  25 Units Subcutaneous Daily   isosorbide dinitrate  20 mg Oral TID   lidocaine  1 patch Transdermal Q24H   metoprolol tartrate  75 mg Oral BID   polyethylene glycol  17 g Oral BID   rosuvastatin  10 mg Oral QHS   sodium bicarbonate  1,300 mg Oral BID   acetaminophen, albuterol, alteplase, anticoagulant sodium citrate, cyclobenzaprine, heparin, hydrALAZINE, lidocaine (PF), lidocaine-prilocaine, LORazepam, melatonin, nicotine, nitroGLYCERIN, pentafluoroprop-tetrafluoroeth, senna-docusate, traMADol  Assessment/ Plan:  [name] is a 42 y.o.  male with past medical conditions including depression, HIV, hyperlipidemia, hypertension, BPH, diabetes who was admitted to [facility] on 03/02/2022 for Hyperkalemia [E87.5] Hypoglycemia [E16.2] Acute renal failure, unspecified acute renal failure type (HCC) [N17.9]   Acute kidney injury with proteinuria/hyperkalemia/hyponatremia.  Creatinine 5.05 on ED admission.  AKI appears multifactorial due to recent UTI infection, hypovolemia, and NSAID use.  NSAID use can cause glomular disease and FSGS, resulting in proteinuria.  Other differential includes HIV-associated nephropathy.  Patient also has underlying insulin-dependent diabetes.  Hemoglobin A1c 7.8% from 09/09/2021.   UPC ratio 3.95  on 03/04/2022.  Renal ultrasound negative for obstruction. Serologic workup complements-C3-C4 normal.  ANA negative,  anca, NEG. kappa /Lambda ration mildly elevated.   Baseline creatinine of 0.78 on November 28, 2021. Creatinine remains stable with adequate urine output. Appears renal  recovery in progress. Will continue to hold dialysis for now. Renal biopsy completed on 03/14/22 and expect preliminary results today. Patient is cleared to discharge from renal stance and follow up with [facility]. Permcath dressing changed today and [facility] can assess renal recovery and have permcath removed when necessary.    Lab Results  Component Value Date   CREATININE 2.57 (H) 03/16/2022   CREATININE 2.52 (H) 03/15/2022   CREATININE 1.85 (H) 03/14/2022    Intake/Output Summary (Last 24 hours) at 03/16/2022 1201 Last data filed at 03/16/2022 1109 Gross per 24 hour  Intake 840 ml  Output 900 ml  Net -60 ml    2. Hypertension, essential. Home regimen includes amlodipine, clonidine, isosorbide, lisinopril and metoprolol.  Continue amlodipine, hydralazine, isosorbide dinitrate. Blood  pressure 110/65    LOS: [place] 2/8/202412:01 PM

## 2022-03-16 NOTE — Progress Notes (Signed)
Patient identified as new dialysis start with AKI, now recovered. Visited patient to give education on diet and chronic kidney disease. Patients wife on the phone when discussion about phosphorus and potassium in the diet. Patient and wife both verbalized understanding. Kidney Education packet assembled and given to patient. Patient to be discharged later today.      Aurther [facility] Nurse Coordinator [phone]

## 2022-03-16 NOTE — TOC Progression Note (Signed)
Transition of Care [facility]) - Progression Note    Patient Details  Name: Cory Campbell MRN: [id number] Date of Birth: 07/21/1980  Transition of Care [facility]) CM/SW [place], LCSW Phone Number: 03/16/2022, 11:03 AM  Clinical Narrative:  Patient confirmed he will need EMS transport home and is aware he may receive a bill for it. Address on facesheet is correct. Wife was on speaker phone and confirmed she will be home all day to let him in whenever she arrives.   Expected Discharge Plan: Home/Self Care Barriers to Discharge: Continued Medical Work up  Expected Discharge Plan and Services       Living arrangements for the past 2 months: Single Family Home Expected Discharge Date: 03/16/22                                     Social Determinants of Health (SDOH) Interventions SDOH Screenings   Food Insecurity: No Food Insecurity (11/24/2021)  Housing: Low Risk  (11/24/2021)  Transportation Needs: No Transportation Needs (11/24/2021)  Utilities: Not At Risk (11/24/2021)  Tobacco Use: High Risk (03/13/2022)    Readmission Risk Interventions    03/03/2022    4:01 PM 09/11/2021   10:27 AM  Readmission Risk Prevention Plan  Transportation Screening Complete Complete  PCP or Specialist Appt within 3-5 Days  Complete  HRI or [place]  Complete  Social Work Consult for [place] Planning/Counseling  Complete  Palliative Care Screening  Not Applicable  Medication Review Press photographer) Complete Complete  PCP or Specialist appointment within 3-5 days of discharge Complete   HRI or [place] Complete   SW Recovery Care/Counseling Consult Complete   [place] Not Applicable

## 2022-03-16 NOTE — Inpatient Diabetes Management (Addendum)
Inpatient Diabetes Program Recommendations  AACE/ADA: New Consensus Statement on Inpatient Glycemic Control   Target Ranges:  Prepandial:   less than 140 mg/dL      Peak postprandial:   less than 180 mg/dL (1-2 hours)      Critically ill patients:  140 - 180 mg/dL    Latest Reference Range & Units 03/16/22 02:12  Glucose 70 - 99 mg/dL 252 (H)    Latest Reference Range & Units 03/15/22 07:29 03/15/22 11:51 03/15/22 15:08 03/15/22 20:35  Glucose-Capillary 70 - 99 mg/dL 228 (H) 247 (H) 206 (H) 259 (H)   Review of Glycemic Control  Diabetes history: DM2 Outpatient Diabetes medications: Tresiba 740 units BID, Trulicity 4.5 mg Qweek, Metformin 1000 mg BID, Jardiance 25 mg daily Current orders for Inpatient glycemic control: Semglee 22 units daily, Novolog 0-9 units TID with meals, Novolog 0-5 units QHS   Inpatient Diabetes Program Recommendations:     Insulin: CBGs 206-259 mg/dl on 03/15/22 and lab glucose 252 mg/dl today. Please consider increasing Semglee to 25 units daily and ordering Novolog 3 units TID with meals for meal coverage if patient eats at least 50% of meals.  Thanks, Barnie Alderman, RN, MSN, [place] Diabetes Coordinator Inpatient Diabetes Program [phone] (Team Pager from 8am to [place])

## 2022-03-16 NOTE — Discharge Summary (Signed)
Physician Discharge Summary  Cory Campbell VZD:[id number] DOB: 1980-05-13 DOA: 03/02/2022  PCP: Emogene Morgan, MD  Admit date: 03/02/2022  Discharge date: 03/16/2022  Admitted From: Home  Disposition:  Home  Recommendations for Outpatient Follow-up:  Follow up with PCP in 1-2 weeks. Please obtain BMP/CBC in one week Advised to follow-up with the [facility] as scheduled. Patient to be discharged home with testio catheter , No need for further hemodialysis.  Home Health: None Equipment/Devices:None  Discharge Condition: Stable CODE STATUS:Full code Diet recommendation: Heart Healthy   Brief [facility] Course: [name] is a 42 year old male with PMH significant for arthrogryposis multiplex congenita [facility]), walks with crutches at baseline, CKD4, depression, HIV on Biktarvy who presented to the emergency room on 1/25 with hypoglycemia and noted to have hypotension.  Noted to have hyperkalemia and AKI and urinary tract infection.  Nephrology consulted workup underway as well as discussion to start dialysis.  Vascular surgery saw patient and placed temporary dialysis catheter on 1/27.  After several sessions of hemodialysis, patient showing some signs of improvement in creatinine, although still needs hemodialysis.  Vascular surgery placed PermCath placement 03/10/22.  Patient underwent renal biopsy on 03/14/22  by IR.  Renal functions showed some improvement.  Nephrology states he does not need anymore hemodialysis but continues to have a testio catheter and he needs close follow-up with Dr Cory Campbell nephrology.  Patient feels better and wants to be discharged.  Patient being discharged home.  Discharge Diagnoses:  Principal Problem:   Hyperkalemia Active Problems:   Lactic acidosis   Metabolic acidosis   Hypotension   AKI (acute kidney injury) (HCC)   HIV (human immunodeficiency virus infection) (HCC)   BPH (benign prostatic hyperplasia)   Essential hypertension   Tobacco  dependence   Depression with anxiety   Insulin dependent type 2 diabetes mellitus (HCC)   Sleep apnea   Frequent falls   Hyperlipidemia associated with type 2 diabetes mellitus (HCC)   Arthrogryposis, with contractures and functional paraplegia   Seizure disorder (HCC)   Weakness   Hyponatremia  Hyperkalemia-resolved Secondary to worsening renal failure.  Status post Lokelma.   Metabolic acidosis Secondary to uremia > Resolved.   AKI (acute kidney injury) (HCC) in the setting of stage IV chronic kidney disease: He is requiring dialysis.  Workup in progress, although suspecting NSAID use.  Getting intermittent dialysis.   Vascular surgery placed PermCath on 2/2.  Patient being set up with outpatient dialysis for now.   Renal biopsy initially delayed due to patient being on aspirin, but now following 5 days of clearance,  Had renal biopsy with IR on 2/6. Nephrology states he does not need any more hemodialysis but continues to have testio catheter. He needs close follow-up with [facility] nephrology.   UTI Completed antibiotic course.   HIV (human immunodeficiency virus infection) (HCC) May need to change medication from [facility] if creatinine clearance does not improve.  Continue Biktarvy.   Essential hypertension Home medications resumed with additional medications added.  Blood pressure Improved.   Tobacco dependence - As needed nicotine patch ordered   Hyponatremia - Secondary to hypervolemia.  Sodium now normalized   Weakness - Multifactorial   Seizure disorder (HCC) - Ativan 2 mg IV as needed for seizure, 2 doses ordered   Frequent falls - Fall precaution   Sleep apnea - CPAP nightly ordered   Insulin dependent type 2 diabetes mellitus (HCC) - Insulin SSI with at bedtime coverage ordered CBGs continuing to trend  upward, so continue to titrate up long-acting insulin dose, but still nowhere near patient's home dose.  Also with sliding scale.     Body mass index is  24.37 kg/m.      Discharge Instructions  Discharge Instructions     Call MD for:  difficulty breathing, headache or visual disturbances   Complete by: As directed    Call MD for:  persistant dizziness or light-headedness   Complete by: As directed    Call MD for:  persistant nausea and vomiting   Complete by: As directed    Diet - low sodium heart healthy   Complete by: As directed    Diet Carb Modified   Complete by: As directed    Discharge instructions   Complete by: As directed    Advised to follow-up with primary care physician in 1 week. Advised to follow-up with the [facility] nephrology as scheduled. Patient to be discharged home with testio catheter , may need  hemodialysis therapy outpatient.   Increase activity slowly   Complete by: As directed    No wound care   Complete by: As directed       Allergies as of 03/16/2022       Reactions   Penicillins Hives, Itching, Rash   Has patient had a PCN reaction causing immediate rash, facial/tongue/throat swelling, SOB or lightheadedness with hypotension: Yes Has patient had a PCN reaction causing severe rash involving mucus membranes or skin necrosis: No Has patient had a PCN reaction that required hospitalization No Has patient had a PCN reaction occurring within the last 10 years: No If all of the above answers are "NO", then may proceed with Cephalosporin use. Has tolerated amoxicillin without problems   Erythromycin Base Itching, Rash        Medication List     STOP taking these medications    acyclovir 400 MG tablet Commonly known as: ZOVIRAX Replaced by: acyclovir 200 MG capsule   chlorthalidone 25 MG tablet Commonly known as: HYGROTON   cloNIDine 0.3 mg/24hr patch Commonly known as: CATAPRES - Dosed in mg/24 hr   isosorbide mononitrate 60 MG 24 hr tablet Commonly known as: IMDUR   lisinopril 40 MG tablet Commonly known as: ZESTRIL   metFORMIN 500 MG 24 hr tablet Commonly known as: GLUCOPHAGE-XR    naproxen 500 MG tablet Commonly known as: NAPROSYN   sulfamethoxazole-trimethoprim 800-160 MG tablet Commonly known as: BACTRIM DS       TAKE these medications    acyclovir 200 MG capsule Commonly known as: ZOVIRAX Take 1 capsule (200 mg total) by mouth 2 (two) times daily. Replaces: acyclovir 400 MG tablet   AeroChamber Plus inhaler Use as instructed   albuterol (2.5 MG/3ML) 0.083% nebulizer solution Commonly known as: PROVENTIL Take 2.5 mg by nebulization every 4 (four) hours as needed for wheezing or shortness of breath.   albuterol 108 (90 Base) MCG/ACT inhaler Commonly known as: VENTOLIN HFA Inhale 2 puffs into the lungs every 4 (four) hours as needed for wheezing or shortness of breath.   amLODipine 10 MG tablet Commonly known as: NORVASC Take 10 mg by mouth daily.   aspirin EC 81 MG tablet Take 81 mg by mouth daily. Swallow whole.   Biktarvy 50-200-25 MG Tabs tablet Generic drug: bictegravir-emtricitabine-tenofovir AF Take 1 tablet by mouth daily.   buprenorphine 7.5 MCG/HR Commonly known as: BUTRANS Place 7.5 mg onto the skin once a week. Saturday   buPROPion 150 MG 24 hr tablet Commonly known as: WELLBUTRIN  XL Take 150 mg by mouth daily.   cetirizine 10 MG tablet Commonly known as: ZYRTEC Take 10 mg by mouth daily.   citalopram 20 MG tablet Commonly known as: CELEXA Take 20 mg by mouth daily.   diclofenac sodium 1 % Gel Commonly known as: VOLTAREN Apply 2-4 g topically 4 (four) times daily as needed. For pain.   dicyclomine 10 MG capsule Commonly known as: BENTYL Take 20 mg by mouth 2 (two) times daily before a meal.   empagliflozin 25 MG Tabs tablet Commonly known as: JARDIANCE Take 25 mg by mouth daily.   famotidine 20 MG tablet Commonly known as: PEPCID Take 20 mg by mouth daily.   feeding supplement (GLUCERNA SHAKE) Liqd Take 237 mLs by mouth 4 (four) times daily.   ferrous sulfate 325 (65 FE) MG tablet Take 325 mg by mouth  daily with breakfast.   finasteride 5 MG tablet Commonly known as: PROSCAR Take 5 mg by mouth daily.   fluticasone 110 MCG/ACT inhaler Commonly known as: FLOVENT HFA Inhale 2 puffs into the lungs 2 (two) times daily.   fluticasone 50 MCG/ACT nasal spray Commonly known as: FLONASE Place 2 sprays into both nostrils daily.   gabapentin 300 MG capsule Commonly known as: NEURONTIN Take 1 capsule (300 mg total) by mouth 2 (two) times daily. What changed: when to take this   glucose 4 GM chewable tablet Chew 1 tablet by mouth as needed for low blood sugar.   hydrALAZINE 50 MG tablet Commonly known as: APRESOLINE Take 1 tablet (50 mg total) by mouth 3 (three) times daily.   insulin degludec 100 UNIT/ML FlexTouch Pen Commonly known as: TRESIBA Inject 100 Units into the skin 2 (two) times daily.   ipratropium 0.06 % nasal spray Commonly known as: ATROVENT Place 2 sprays into both nostrils 4 (four) times daily.   isosorbide dinitrate 20 MG tablet Commonly known as: ISORDIL Take 1 tablet (20 mg total) by mouth 3 (three) times daily.   ketoconazole 2 % shampoo Commonly known as: NIZORAL Apply 1 application topically 2 (two) times a week. tues and thursday   melatonin 5 MG Tabs Take 10 mg by mouth at bedtime as needed (sleep).   Metoprolol Tartrate 75 MG Tabs Take 1 tablet (75 mg total) by mouth 2 (two) times daily. What changed:  medication strength See the new instructions.   mirtazapine 45 MG tablet Commonly known as: REMERON Take 45 mg by mouth at bedtime.   multivitamin tablet Take 1 tablet by mouth daily.   nitroGLYCERIN 0.4 MG SL tablet Commonly known as: NITROSTAT PLACE 1 TABLET SUBLINGUALLY EVERY 5 MINUTES AS NEEDED FOR CHEST PAIN. CALL 911 IF YOU HAVE TAKEN 3 NITRO FOR CHEST PAIN   nortriptyline 25 MG capsule Commonly known as: PAMELOR Take 50 mg by mouth at bedtime.   omega-3 acid ethyl esters 1 g capsule Commonly known as: LOVAZA Take 1 g by mouth 3  (three) times daily.   omeprazole 20 MG capsule Commonly known as: PRILOSEC Take 20 mg by mouth daily.   ondansetron 4 MG disintegrating tablet Commonly known as: ZOFRAN-ODT Take 4 mg by mouth every 8 (eight) hours as needed for nausea or vomiting.   polyethylene glycol 17 g packet Commonly known as: MIRALAX / GLYCOLAX Take 17 g by mouth 3 (three) times daily.   rosuvastatin 40 MG tablet Commonly known as: CRESTOR TAKE 1 TABLET(40 MG) BY MOUTH DAILY   solifenacin 10 MG tablet Commonly known as: VESICARE Take 10  mg by mouth daily.   spironolactone 50 MG tablet Commonly known as: ALDACTONE Take 100 mg by mouth daily.   T.E.D. Below Knee/Medium Misc 1 each by Does not apply route daily. Measure legs and give appropriate size.   tiZANidine 4 MG tablet Commonly known as: Zanaflex Take 1 tablet (4 mg total) by mouth 3 (three) times daily.   Trulicity 4.5 0000000 Sopn Generic drug: Dulaglutide SMARTSIG:4.5 Milligram(s) SUB-Q Once a Week        Follow-up Information     Schnier, Dolores Lory, MD. Go in 1 month(s).   Specialties: Vascular Surgery, Cardiology, Radiology, Vascular Surgery Why: Appointment on Thursday, 04/13/2022 at 8:30am. Contact information: [address] [place] [address] [phone]                Allergies  Allergen Reactions   Penicillins Hives, Itching and Rash    Has patient had a PCN reaction causing immediate rash, facial/tongue/throat swelling, SOB or lightheadedness with hypotension: Yes Has patient had a PCN reaction causing severe rash involving mucus membranes or skin necrosis: No Has patient had a PCN reaction that required hospitalization No Has patient had a PCN reaction occurring within the last 10 years: No If all of the above answers are "NO", then may proceed with Cephalosporin use.  Has tolerated amoxicillin without problems   Erythromycin Base Itching and Rash    Consultations: Nephrology Vascular  surgery   Procedures/Studies: US BIOPSY (KIDNEY)  Result Date: 03/14/2022 INDICATION: AKI EXAM: Ultrasound-guided random renal core needle biopsy MEDICATIONS: None. ANESTHESIA/SEDATION: Moderate (conscious) sedation was employed during this procedure. A total of Versed 1 mg and Fentanyl 50 mcg was administered intravenously by the radiology nurse. Total intra-service moderate Sedation Time: 15 minutes. The patient's level of consciousness and vital signs were monitored continuously by radiology nursing throughout the procedure under my direct supervision. COMPLICATIONS: None immediate. PROCEDURE: Informed written consent was obtained from the patient after a thorough discussion of the procedural risks, benefits and alternatives. All questions were addressed. Maximal Sterile Barrier Technique was utilized including caps, mask, sterile gowns, sterile gloves, sterile drape, hand hygiene and skin antiseptic. A timeout was performed prior to the initiation of the procedure. The patient was placed prone on the exam table. Limited ultrasound of the bilateral flanks was performed for planning purposes. The left renal lower pole was selected as appropriate for percutaneous biopsy access. Skin entry site was marked, and the overlying skin was prepped and draped in the standard sterile fashion. Local analgesia was obtained with 1% lidocaine. Using ultrasound guidance, a 17 gauge introducer needle was advanced just deep to the cortex of the lower pole. Subsequently, core needle biopsy was performed using a 18 gauge core biopsy device x2 total passes. Specimens were submitted in saline to pathology for further handling. Gel-Foam slurry was administered through the introducer needle as it was removed to assist with hemostasis. Limited postprocedure imaging demonstrated expected post biopsy changes without hematoma. A clean dressing was placed. The patient tolerated the procedure well without immediate complication.  IMPRESSION: Successful ultrasound-guided random renal core needle biopsy of the left renal lower pole. Electronically Signed   By: Albin Felling M.D.   On: 03/14/2022 13:45   PERIPHERAL VASCULAR CATHETERIZATION  Result Date: 03/10/2022 See surgical note for result.  CT ABDOMEN PELVIS WO CONTRAST  Result Date: 03/05/2022 CLINICAL DATA:  Right lower quadrant abdominal pain. EXAM: CT ABDOMEN AND PELVIS WITHOUT CONTRAST TECHNIQUE: Multidetector CT imaging of the abdomen and pelvis was  performed following the standard protocol without IV contrast. RADIATION DOSE REDUCTION: This exam was performed according to the departmental dose-optimization program which includes automated exposure control, adjustment of the mA and/or kV according to patient size and/or use of iterative reconstruction technique. COMPARISON:  CT of the abdomen and pelvis without contrast 02/25/2022 FINDINGS: Lower chest: The lung bases are clear. Heart size is normal. Minimal pericardial fluid is likely within normal limits. Hepatobiliary: The liver is unremarkable. Layering gallstones are present at the neck of the gallbladder without significant inflammatory changes about the gallbladder. The common bile duct is within normal limits. Pancreas: Unremarkable. No pancreatic ductal dilatation or surrounding inflammatory changes. Spleen: Normal in size without focal abnormality. Adrenals/Urinary Tract: The adrenal glands are normal bilaterally. Kidneys are unremarkable. No stone or mass lesion is present. The ureters are within normal limits bilaterally. Circumferential thickening of the bladder wall is again noted. Stomach/Bowel: The stomach and duodenum within normal limits. Small bowel is unremarkable. Terminal ileum is within normal limits. Contrast is present with throughout the colon. Diverticular changes are present the ascending transverse colon without focal inflammation. The colon is incompletely distended. Moderate stool is present in  the sigmoid colon. There is marked thickening of the rectal mucosa without a discrete mass. Vascular/Lymphatic: No significant vascular findings are present. No enlarged abdominal or pelvic lymph nodes. Reproductive: Prostate gland and seminal vesicles are within normal limits. Other: No abdominal wall hernia or abnormality. No abdominopelvic ascites. Subcutaneous inflammatory changes over the lower abdomen likely reflect injection sites. Musculoskeletal: Vertebral body heights and alignment are normal. Chronic bilateral hip dislocations are noted. No other acute or focal osseous abnormalities are present. IMPRESSION: 1. Marked thickening of the rectal mucosa without a discrete is most likely inflammatory. No discrete mass lesion is present. Correlate with exam. 2. Colonic diverticulosis without diverticulitis. 3. Extensive bladder wall thickening again noted. This may reflect chronic cystitis. 4. Cholelithiasis without evidence for cholecystitis. 5. Chronic bilateral hip dislocations. Electronically Signed   By: San Morelle M.D.   On: 03/05/2022 13:40   DG Chest Port 1 View  Result Date: 03/04/2022 CLINICAL DATA:  Central line placement EXAM: PORTABLE CHEST 1 VIEW COMPARISON:  Chest x-ray March 02, 2022 FINDINGS: A new double lumen right central line has been placed, terminating in the right atrium, approximally 2.5 cm inferior to the caval atrial junction. The cardiomediastinal silhouette is stable. No pneumothorax. No other acute abnormalities. IMPRESSION: A new double lumen right central line terminates in the right atrium, approximally 2.5 cm inferior to the caval atrial junction. No pneumothorax. Electronically Signed   By: Dorise Bullion III M.D.   On: 03/04/2022 16:08   DG Chest Port 1 View  Result Date: 03/02/2022 CLINICAL DATA:  [id number] Altered mental status [id number] EXAM: PORTABLE CHEST - 1 VIEW COMPARISON:  12/24/2021 FINDINGS: Cardiac silhouette is unremarkable. No pneumothorax or  pleural effusion. The lungs are clear. The visualized skeletal structures are unremarkable. IMPRESSION: No acute cardiopulmonary process. Electronically Signed   By: Sammie Bench M.D.   On: 03/02/2022 14:57   CT HEAD WO CONTRAST (5MM)  Result Date: 03/02/2022 CLINICAL DATA:  Altered mental status EXAM: CT HEAD WITHOUT CONTRAST TECHNIQUE: Contiguous axial images were obtained from the base of the skull through the vertex without intravenous contrast. RADIATION DOSE REDUCTION: This exam was performed according to the departmental dose-optimization program which includes automated exposure control, adjustment of the mA and/or kV according to patient size and/or use of iterative reconstruction technique. COMPARISON:  02/27/2022 FINDINGS:  Brain: No evidence of acute infarction, hemorrhage, hydrocephalus, extra-axial collection or mass lesion/mass effect. Vascular: No hyperdense vessel or unexpected calcification. Skull: Normal. Negative for fracture or focal lesion. Sinuses/Orbits: No acute finding. IMPRESSION: No acute intracranial process. Electronically Signed   By: Sammie Bench M.D.   On: 03/02/2022 14:50   US RENAL  Result Date: 03/02/2022 CLINICAL DATA:  Acute renal failure. EXAM: RENAL / URINARY TRACT ULTRASOUND COMPLETE COMPARISON:  CT abdomen/pelvis 02/25/2022. FINDINGS: Right Kidney: Renal measurements: 12.3 x 7.4 x 6.3 = volume: 297.9. mL. Increased echogenicity. No mass or hydronephrosis. Left Kidney: Renal measurements: 10.4 x 7.3 x 6.1 = volume: 241.2 mL. Increased echogenicity. No mass or hydronephrosis. Bladder: Appears normal for degree of bladder distention. Other: None. IMPRESSION: Increased echogenicity in the bilateral kidneys consistent with medical renal disease. Electronically Signed   By: Emmit Alexanders M.D.   On: 03/02/2022 13:47   CT Head Wo Contrast  Result Date: 02/27/2022 CLINICAL DATA:  Headache, sudden, severe EXAM: CT HEAD WITHOUT CONTRAST TECHNIQUE: Contiguous axial  images were obtained from the base of the skull through the vertex without intravenous contrast. RADIATION DOSE REDUCTION: This exam was performed according to the departmental dose-optimization program which includes automated exposure control, adjustment of the mA and/or kV according to patient size and/or use of iterative reconstruction technique. COMPARISON:  12/24/2021 FINDINGS: Brain: No evidence of acute infarction, hemorrhage, hydrocephalus, extra-axial collection or mass lesion/mass effect. Vascular: No hyperdense vessel or unexpected calcification. Skull: Normal. Negative for fracture or focal lesion. Sinuses/Orbits: No acute finding. IMPRESSION: No acute intracranial process. Electronically Signed   By: Sammie Bench M.D.   On: 02/27/2022 16:09   CT ABDOMEN PELVIS WO CONTRAST  Result Date: 02/25/2022 CLINICAL DATA:  Left lower quadrant abdominal pain. EXAM: CT ABDOMEN AND PELVIS WITHOUT CONTRAST TECHNIQUE: Multidetector CT imaging of the abdomen and pelvis was performed following the standard protocol without IV contrast. RADIATION DOSE REDUCTION: This exam was performed according to the departmental dose-optimization program which includes automated exposure control, adjustment of the mA and/or kV according to patient size and/or use of iterative reconstruction technique. COMPARISON:  CT abdomen pelvis dated 12/24/2021. FINDINGS: Evaluation of this exam is limited in the absence of intravenous contrast. Lower chest: The visualized lung bases are clear. No intra-abdominal free air or free fluid. Hepatobiliary: The liver is unremarkable. No radiopaque a shin. Multiple gallstones. No pericholecystic fluid or evidence of acute cholecystitis by CT. Pancreas: Unremarkable. No pancreatic ductal dilatation or surrounding inflammatory changes. Spleen: Normal in size without focal abnormality. Adrenals/Urinary Tract: The adrenal glands are unremarkable. There is no hydronephrosis or nephrolithiasis on  either side. The visualized ureters appear unremarkable. Circumferential thickening of the bladder wall as seen on the prior CT, likely representing chronic infection or bladder dysfunction. Correlation with urinalysis recommended to evaluate for possibility of active cystitis. Stomach/Bowel: Moderate stool throughout the colon. There is scattered colonic diverticula without active inflammatory changes. There is no bowel obstruction or active inflammation. The appendix is not visualized with certainty. No inflammatory changes identified in the right lower quadrant. Vascular/Lymphatic: The abdominal aorta and IVC are grossly unremarkable on this noncontrast CT. No portal venous gas. There is no adenopathy. Reproductive: The prostate and seminal vesicles are grossly unremarkable no free gas. Other: Fatty atrophy of the pelvic musculature. Midline vertical anterior abdominal wall incisional scar. Focal area of induration of the subcutaneous soft tissues of the right anterior abdominal wall, likely related to subcutaneous injection or sequela of contusion. No fluid collection. Musculoskeletal:  Chronic deformity and dislocation of the hips. No acute osseous pathology. IMPRESSION: 1. No hydronephrosis or nephrolithiasis. 2. Circumferential thickening of the bladder wall as seen on the prior CT, likely representing chronic infection or neurogenic bladder dysfunction. Correlation with urinalysis recommended to evaluate for possibility of active cystitis. 3. Colonic diverticulosis. No bowel obstruction. 4. Cholelithiasis. Electronically Signed   By: Anner Crete M.D.   On: 02/25/2022 01:48     Subjective: Patient was seen and examined at bedside.  Overnight events noted.   Patient reports doing much better.  Renal functions improved.  Patient is being discharged home.  Discharge Exam: Vitals:   03/16/22 1034 03/16/22 1149  BP: 110/65 95/73  Pulse: (!) 102 96  Resp:  14  Temp: 98.3 F (36.8 C) 99.5 F (37.5  C)  SpO2:  100%   Vitals:   03/16/22 0829 03/16/22 0944 03/16/22 1034 03/16/22 1149  BP: 118/84 116/81 110/65 95/73  Pulse: (!) 115 (!) 127 (!) 102 96  Resp: 16 14  14   Temp: 98.3 F (36.8 C) 98.3 F (36.8 C) 98.3 F (36.8 C) 99.5 F (37.5 C)  TempSrc: Oral Oral Oral Oral  SpO2: 100% 100%  100%  Weight:      Height:        General: Pt is alert, awake, not in acute distress Cardiovascular: RRR, S1/S2 +, no rubs, no gallops Respiratory: CTA bilaterally, no wheezing, no rhonchi Abdominal: Soft, NT, ND, bowel sounds + Extremities: no edema, no cyanosis    The results of significant diagnostics from this hospitalization (including imaging, microbiology, ancillary and laboratory) are listed below for reference.     Microbiology: No results found for this or any previous visit (from the past 240 hour(s)).   Labs: BNP (last 3 results) Recent Labs    11/24/21 0717  BNP [id number]   Basic Metabolic Panel: Recent Labs  Lab 03/10/22 0906 03/11/22 1521 03/12/22 0625 03/14/22 0438 03/15/22 0437 03/16/22 0212  NA 135 135 138 137 135 135  K 3.1* 3.2* 3.3* 3.0* 2.9* 3.3*  CL 99 99 99 98 98 98  CO2 22 23 29 27 26 26   GLUCOSE 202* 190* 221* 225* 256* 252*  BUN 41* 53* 33* 27* 44* 41*  CREATININE 4.36* 4.28* 2.73* 1.85* 2.52* 2.57*  CALCIUM 8.8* 9.1 9.1 8.9 8.9 9.0  PHOS 3.5  --   --   --  4.3 4.7*   Liver Function Tests: Recent Labs  Lab 03/10/22 0906 03/15/22 0437 03/16/22 0212  ALBUMIN 3.5 3.5 3.2*   No results for input(s): "LIPASE", "AMYLASE" in the last 168 hours. No results for input(s): "AMMONIA" in the last 168 hours. CBC: Recent Labs  Lab 03/11/22 1521 03/12/22 0625 03/14/22 0438 03/15/22 0437  WBC 7.2 7.8 6.3 11.0*  NEUTROABS  --   --  3.9  --   HGB 12.7* 13.1 12.1* 11.2*  HCT 38.2* 39.5 36.1* 33.8*  MCV 79.7* 80.4 81.1 82.6  PLT 340 335 403* 404*   Cardiac Enzymes: No results for input(s): "CKTOTAL", "CKMB", "CKMBINDEX", "TROPONINI" in the last  168 hours. BNP: Invalid input(s): "POCBNP" CBG: Recent Labs  Lab 03/15/22 1151 03/15/22 1508 03/15/22 2035 03/16/22 0827 03/16/22 1158  GLUCAP 247* 206* 259* 205* 273*   D-Dimer No results for input(s): "DDIMER" in the last 72 hours. Hgb A1c No results for input(s): "HGBA1C" in the last 72 hours. Lipid Profile No results for input(s): "CHOL", "HDL", "LDLCALC", "TRIG", "CHOLHDL", "LDLDIRECT" in the last 72 hours. Thyroid function  studies No results for input(s): "TSH", "T4TOTAL", "T3FREE", "THYROIDAB" in the last 72 hours.  Invalid input(s): "FREET3" Anemia work up No results for input(s): "VITAMINB12", "FOLATE", "FERRITIN", "TIBC", "IRON", "RETICCTPCT" in the last 72 hours. Urinalysis    Component Value Date/Time   COLORURINE YELLOW (A) 03/02/2022 1208   APPEARANCEUR CLOUDY (A) 03/02/2022 1208   APPEARANCEUR Cloudy 05/16/2014 0350   LABSPEC 1.014 03/02/2022 1208   LABSPEC 1.018 05/16/2014 0350   PHURINE 5.0 03/02/2022 1208   GLUCOSEU >=500 (A) 03/02/2022 1208   GLUCOSEU Negative 05/16/2014 0350   HGBUR NEGATIVE 03/02/2022 [place] 03/02/2022 1208   BILIRUBINUR Negative 05/16/2014 0350   KETONESUR NEGATIVE 03/02/2022 1208   PROTEINUR 100 (A) 03/02/2022 1208   NITRITE NEGATIVE 03/02/2022 1208   LEUKOCYTESUR TRACE (A) 03/02/2022 1208   LEUKOCYTESUR 1+ 05/16/2014 0350   Sepsis Labs Recent Labs  Lab 03/11/22 1521 03/12/22 0625 03/14/22 0438 03/15/22 0437  WBC 7.2 7.8 6.3 11.0*   Microbiology No results found for this or any previous visit (from the past 240 hour(s)).   Time coordinating discharge: Over 30 minutes  SIGNED:   Shawna Clamp, MD  [facility] 03/16/2022, 3:45 PM Pager   If 7PM-7AM, please contact night-coverage

## 2022-04-06 LAB — SURGICAL PATHOLOGY

## 2022-04-07 ENCOUNTER — Emergency Department: Payer: Medicaid Other

## 2022-04-07 ENCOUNTER — Other Ambulatory Visit: Payer: Self-pay

## 2022-04-07 ENCOUNTER — Encounter: Payer: Self-pay | Admitting: Emergency Medicine

## 2022-04-07 ENCOUNTER — Emergency Department
Admission: EM | Admit: 2022-04-07 | Discharge: 2022-04-07 | Disposition: A | Discharge status: Home / Self Care | Payer: Medicaid Other | Attending: Student in an Organized Health Care Education/Training Program | Admitting: Student in an Organized Health Care Education/Training Program

## 2022-04-07 ENCOUNTER — Encounter: Payer: Self-pay | Admitting: Interventional Radiology

## 2022-04-07 DIAGNOSIS — Z452 Encounter for adjustment and management of vascular access device: Secondary | ICD-10-CM | POA: Diagnosis present

## 2022-04-07 LAB — BASIC METABOLIC PANEL
Anion gap: 9 (ref 5–15)
BUN: 27 mg/dL — ABNORMAL HIGH (ref 6–20)
CO2: 24 mmol/L (ref 22–32)
Calcium: 9.8 mg/dL (ref 8.9–10.3)
Chloride: 101 mmol/L (ref 98–111)
Creatinine, Ser: 1.54 mg/dL — ABNORMAL HIGH (ref 0.61–1.24)
GFR, Estimated: 57 mL/min — ABNORMAL LOW (ref >60)
Glucose, Bld: 163 mg/dL — ABNORMAL HIGH (ref 70–99)
Potassium: 5.1 mmol/L (ref 3.5–5.1)
Sodium: 134 mmol/L — ABNORMAL LOW (ref 135–145)

## 2022-04-07 NOTE — ED Triage Notes (Addendum)
Pt here with complications from his portacath. Pt thinks that it has become loose and he fears that it may not work properly. Pt endorses pain to that area.

## 2022-04-07 NOTE — ED Provider Notes (Signed)
[facility] Provider Note    Event Date/Time   First MD Initiated Contact with Patient 04/07/22 1138     (approximate)   History   Vascular Access Problem   HPI  Cory Campbell is a 42 y.o. male complicated past medical history with recent PermCath placement presents to the ER for evaluation of noting that he pulled on the catheter last night.  Stitches did not pull out.  Is not currently on dialysis he still making urine.  He is hoping that he will need dialysis will be able to have catheter removed.  He denies any other chest pain or pressure.  No nausea or vomiting.     Physical Exam   Triage Vital Signs: ED Triage Vitals [04/07/22 1020]  Enc Vitals Group     BP 106/80     Pulse Rate 82     Resp 18     Temp 97.9 F (36.6 C)     Temp Source Oral     SpO2 98 %     Weight 151 lb 0.2 oz (68.5 kg)     Height '5\' 6"'$  (1.676 m)     Head Circumference      Peak Flow      Pain Score 9     Pain Loc      Pain Edu?      Excl. in [place]?     Most recent vital signs: Vitals:   04/07/22 1020  BP: 106/80  Pulse: 82  Resp: 18  Temp: 97.9 F (36.6 C)  SpO2: 98%     Constitutional: Alert  Eyes: Conjunctivae are normal.  Head: Atraumatic. Nose: No congestion/rhinnorhea. Mouth/Throat: Mucous membranes are moist.   Neck: Painless ROM.  Cardiovascular:   Good peripheral circulation. Respiratory: Normal respiratory effort.  No retractions.  Gastrointestinal: Soft and nontender.  Musculoskeletal:  no deformity  Neurologic:  MAE spontaneously. No gross focal neurologic deficits are appreciated.  Skin:  Skin is warm, dry and intact. No rash noted.  Left side Port-A-Cath appears in appropriate position no bleeding no surrounding erythema fluctuance or drainage. Psychiatric: Mood and affect are normal. Speech and behavior are normal.    ED Results / Procedures / Treatments   Labs (all labs ordered are listed, but only abnormal results are  displayed) Labs Reviewed  BASIC METABOLIC PANEL - Abnormal; Notable for the following components:      Result Value   Sodium 134 (*)    Glucose, Bld 163 (*)    BUN 27 (*)    Creatinine, Ser 1.54 (*)    GFR, Estimated 57 (*)    All other components within normal limits     EKG     RADIOLOGY Please see ED Course for my review and interpretation.  I personally reviewed all radiographic images ordered to evaluate for the above acute complaints and reviewed radiology reports and findings.  These findings were personally discussed with the patient.  Please see medical record for radiology report.    PROCEDURES:  Critical Care performed: No  Procedures   MEDICATIONS ORDERED IN ED: Medications - No data to display   IMPRESSION / MDM / [place] / ED COURSE  I reviewed the triage vital signs and the nursing notes.                              Differential diagnosis includes, but is not limited to, displaced  catheter, cellulitis, electrolyte abnormality  Patient presenting to the ER for evaluation of symptoms as described above.  Based on symptoms, risk factors and considered above differential, this presenting complaint could reflect a potentially life-threatening illness therefore the patient will be placed on continuous pulse oximetry and telemetry for monitoring.  Laboratory evaluation will be sent to evaluate for the above complaints.     Clinical Course as of 04/07/22 1256  Fri Apr 07, 2022  1238 Chest x-ray my review and interpretation shows appropriately placed CBC. [PR]  1255 His blood work is reassuring.  No hyperkalemia or signs or indication for emergent dialysis.  Patient is stable and appropriate for outpatient follow-up.  Catheter was redressed. [PR]    Clinical Course User Index [PR] Merlyn Lot, MD    Patient's presentation is most consistent with    FINAL CLINICAL IMPRESSION(S) / ED DIAGNOSES   Final diagnoses:  Encounter for care  related to Port-a-Cath     Rx / DC Orders   ED Discharge Orders     None        Note:  This document was prepared using Dragon voice recognition software and may include unintentional dictation errors.    Merlyn Lot, MD 04/07/22 1256

## 2022-04-07 NOTE — ED Triage Notes (Signed)
First Nurse Note;  Pt via EMS from home. Pt c/o vascular access problem. Pt hemodialysis port is clogged, was placed on 2/8 here. No dialysis has not been done yet. Pt thinks it may not be in place anymore. States that they were going to recheck kidney function before using it. Pt is A&OX4 and NAD 108/72 BP, 82 HR

## 2022-04-11 DIAGNOSIS — N186 End stage renal disease: Secondary | ICD-10-CM | POA: Insufficient documentation

## 2022-04-11 DIAGNOSIS — E785 Hyperlipidemia, unspecified: Secondary | ICD-10-CM | POA: Insufficient documentation

## 2022-04-11 NOTE — Progress Notes (Unsigned)
MRN : [id number]  Cory Campbell is a 42 y.o. (1980/04/20) male who presents with chief complaint of check access.  History of Present Illness:   The patient is seen for follow up evaluation of dialysis access.  The patient recently started on dialysis and had their initial tunneled catheter placed.  There have not been any previous accesses.    The patient is right handed.   Current access is via a catheter which is is not currently being used.  The patient has had return of his kidney function he was recently seen in the emergency room and his creatinine was 1.5.  No tenderness or drainage at the exit site.  Patient is in a wheelchair.  No history of rest pain symptoms. No new ulcers or wounds of the lower extremities have occurred.  The patient denies amaurosis fugax or recent TIA symptoms. There are no recent neurological changes noted. There is no history of DVT, PE or superficial thrombophlebitis. No recent episodes of angina or shortness of breath documented.  Vein mapping for dialysis access today is reviewed by me.  It does show adequate cephalic vein on the right in the upper arm.  There is adequate basilic vein on the left.  Arterial flow is normal.  No outpatient medications have been marked as taking for the 04/13/22 encounter (Appointment) with Delana Meyer, Dolores Lory, MD.    Past Medical History:  Diagnosis Date   (HFpEF) heart failure with preserved ejection fraction (HCC)    Acid reflux    Adopted    Anxiety    Arthritis    Arthrogryposis    Asthma    BPH (benign prostatic hyperplasia)    Bronchitis    Chronic pain syndrome    Depressive disorder, not elsewhere classified    Diabetes mellitus without complication ([place])    Diverticulitis of colon (without mention of hemorrhage)(562.11)    ED (erectile dysfunction)    Herpes genitalis    HIV infection ([place])    HTN (hypertension)    Hyperlipidemia    Hypertensive cardiomyopathy ([place])     Hypogonadism in male    OAB (overactive bladder)    Other psoriasis    Seizure disorder ([place])    Sleep apnea    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 or other cause, without mention of thyrotoxic crisis or storm    hyperthyroidism    Past Surgical History:  Procedure Laterality Date   APPENDECTOMY     COLOSTOMY     CORNEAL TRANSPLANT     DIALYSIS/PERMA CATHETER INSERTION Right 03/10/2022   Procedure: DIALYSIS/PERMA CATHETER INSERTION;  Surgeon: Katha Cabal, MD;  Location: [facility];  Service: Cardiovascular;  Laterality: Right;   feet surgery     both   HAND SURGERY     left and right   leg surgery     left and right   ORTHOPEDIC SURGERY     hands, feet, knees, legs   tubes in ears     both    Social History Social History   Tobacco Use   Smoking status: Every Day    Types: Pipe, Cigars, E-cigarettes    Last attempt to quit: 01/2018    Years since quitting: 4.2   Smokeless tobacco: Never   Tobacco comments:    Declines patch  Vaping Use   Vaping Use: Every day  Substances: Nicotine, Flavoring  Substance Use Topics   Alcohol use: Not Currently    Alcohol/week: 3.0 standard drinks of alcohol    Types: 3 Cans of beer per week   Drug use: No    Family History Family History  Adopted: Yes  Family history unknown: Yes    Allergies  Allergen Reactions   Penicillins Hives, Itching and Rash    Has patient had a PCN reaction causing immediate rash, facial/tongue/throat swelling, SOB or lightheadedness with hypotension: Yes Has patient had a PCN reaction causing severe rash involving mucus membranes or skin necrosis: No Has patient had a PCN reaction that required hospitalization No Has patient had a PCN reaction occurring within the last 10 years: No If all of the above answers are "NO", then may proceed with Cephalosporin use.  Has tolerated amoxicillin without problems   Erythromycin Base Itching and Rash     REVIEW OF SYSTEMS  (Negative unless checked)  Constitutional: '[]'$ Weight loss  '[]'$ Fever  '[]'$ Chills Cardiac: '[]'$ Chest pain   '[]'$ Chest pressure   '[]'$ Palpitations   '[]'$ Shortness of breath when laying flat   '[]'$ Shortness of breath with exertion. Vascular:  '[]'$ Pain in legs with walking   '[]'$ Pain in legs at rest  '[]'$ History of DVT   '[]'$ Phlebitis   '[]'$ Swelling in legs   '[]'$ Varicose veins   '[]'$ Non-healing ulcers Pulmonary:   '[]'$ Uses home oxygen   '[]'$ Productive cough   '[]'$ Hemoptysis   '[]'$ Wheeze  '[]'$ COPD   '[x]'$ Asthma Neurologic:  '[]'$ Dizziness   '[]'$ Seizures   '[]'$ History of stroke   '[]'$ History of TIA  '[]'$ Aphasia   '[]'$ Vissual changes   '[]'$ Weakness or numbness in arm   '[x]'$ Weakness or numbness in leg Musculoskeletal:   '[]'$ Joint swelling   '[]'$ Joint pain   '[]'$ Low back pain Hematologic:  '[]'$ Easy bruising  '[]'$ Easy bleeding   '[]'$ Hypercoagulable state   '[]'$ Anemic Gastrointestinal:  '[]'$ Diarrhea   '[]'$ Vomiting  '[x]'$ Gastroesophageal reflux/heartburn   '[]'$ Difficulty swallowing. Genitourinary:  '[x]'$ Chronic kidney disease   '[]'$ Difficult urination  '[]'$ Frequent urination   '[]'$ Blood in urine Skin:  '[]'$ Rashes   '[]'$ Ulcers  Psychological:  '[x]'$ History of anxiety   '[]'$  History of major depression.  Physical Examination  There were no vitals filed for this visit. There is no height or weight on file to calculate BMI. Gen: WD/WN, NAD seen in a wheelchair Head: [place]/AT, No temporalis wasting.  Ear/Nose/Throat: Hearing grossly intact, nares w/o erythema or drainage Eyes: PER, EOMI, sclera nonicteric.  Neck: Supple, no gross masses or lesions.  No JVD.  Pulmonary:  Good air movement, no audible wheezing, no use of accessory muscles.  Cardiac: RRR, precordium non-hyperdynamic. Vascular:    visible basilic vein of the left upper extremity Vessel Right Left  Radial Palpable Palpable  Brachial Palpable Palpable  Gastrointestinal: soft, non-distended. No guarding/no peritoneal signs.  Musculoskeletal: M/S 5/5 throughout.  No deformity.  Neurologic: CN 2-12 intact. Pain and light touch intact in  extremities.  Symmetrical.  Speech is fluent. Motor exam as listed above. Psychiatric: Judgment intact, Mood & affect appropriate for pt's clinical situation. Dermatologic: No rashes or ulcers noted.  No changes consistent with cellulitis.   CBC Lab Results  Component Value Date   WBC 11.0 (H) 03/15/2022   HGB 11.2 (L) 03/15/2022   HCT 33.8 (L) 03/15/2022   MCV 82.6 03/15/2022   PLT 404 (H) 03/15/2022    BMET    Component Value Date/Time   NA 134 (L) 04/07/2022 1204   NA 139 07/07/2020 0844   NA 134 (L) 08/28/2013 0024  K 5.1 04/07/2022 1204   K 2.9 (L) 08/28/2013 0024   CL 101 04/07/2022 1204   CL 96 (L) 08/28/2013 0024   CO2 24 04/07/2022 1204   CO2 28 08/28/2013 0024   GLUCOSE 163 (H) 04/07/2022 1204   GLUCOSE 331 (H) 08/28/2013 0024   BUN 27 (H) 04/07/2022 1204   BUN 16 07/07/2020 0844   BUN 8 08/28/2013 0024   CREATININE 1.54 (H) 04/07/2022 1204   CREATININE 0.70 08/28/2013 0024   CALCIUM 9.8 04/07/2022 1204   CALCIUM 7.8 (L) 08/28/2013 0024   GFRNONAA 57 (L) 04/07/2022 1204   GFRNONAA >60 08/28/2013 0024   GFRAA >60 09/15/2018 1835   GFRAA >60 08/28/2013 0024   Estimated Creatinine Clearance: 56.4 mL/min (A) (by C-G formula based on SCr of 1.54 mg/dL (H)).  COAG Lab Results  Component Value Date   INR 1.1 03/13/2022   INR 1.2 03/06/2022   INR 1.0 09/09/2021    Radiology DG Chest Portable 1 View  Result Date: 04/07/2022 CLINICAL DATA:  confirm catheter placement EXAM: PORTABLE CHEST 1 VIEW COMPARISON:  Chest x-ray 03/04/2022. FINDINGS: Left subclavian approach central venous catheter with the tip projecting at the superior right atrium. No visible pneumothorax. No consolidation no visible pleural effusions. Cardiomediastinal silhouette is within normal limits. IMPRESSION: Left subclavian approach central venous catheter with the tip projecting at the superior right atrium. No visible pneumothorax. Electronically Signed   By: Margaretha Sheffield M.D.   On:  04/07/2022 12:23   US BIOPSY (KIDNEY)  Result Date: 03/14/2022 INDICATION: AKI EXAM: Ultrasound-guided random renal core needle biopsy MEDICATIONS: None. ANESTHESIA/SEDATION: Moderate (conscious) sedation was employed during this procedure. A total of Versed 1 mg and Fentanyl 50 mcg was administered intravenously by the radiology nurse. Total intra-service moderate Sedation Time: 15 minutes. The patient's level of consciousness and vital signs were monitored continuously by radiology nursing throughout the procedure under my direct supervision. COMPLICATIONS: None immediate. PROCEDURE: Informed written consent was obtained from the patient after a thorough discussion of the procedural risks, benefits and alternatives. All questions were addressed. Maximal Sterile Barrier Technique was utilized including caps, mask, sterile gowns, sterile gloves, sterile drape, hand hygiene and skin antiseptic. A timeout was performed prior to the initiation of the procedure. The patient was placed prone on the exam table. Limited ultrasound of the bilateral flanks was performed for planning purposes. The left renal lower pole was selected as appropriate for percutaneous biopsy access. Skin entry site was marked, and the overlying skin was prepped and draped in the standard sterile fashion. Local analgesia was obtained with 1% lidocaine. Using ultrasound guidance, a 17 gauge introducer needle was advanced just deep to the cortex of the lower pole. Subsequently, core needle biopsy was performed using a 18 gauge core biopsy device x2 total passes. Specimens were submitted in saline to pathology for further handling. Gel-Foam slurry was administered through the introducer needle as it was removed to assist with hemostasis. Limited postprocedure imaging demonstrated expected post biopsy changes without hematoma. A clean dressing was placed. The patient tolerated the procedure well without immediate complication. IMPRESSION: Successful  ultrasound-guided random renal core needle biopsy of the left renal lower pole. Electronically Signed   By: Albin Felling M.D.   On: 03/14/2022 13:45     Assessment/Plan 1. Renal insufficiency Patient has had return of his renal function.  He no longer needs his dialysis catheter and therefore he is asking that it be removed.  Nephrology is in agreement per the patient.  Risks and benefits were reviewed all questions answered we will plan for removal of his catheter early next week.  Vein mapping was also done today given his brief need for hemodialysis he does have adequate vein for creation of a fistula.  I did talk to him at length regarding fistula creation the various options and both the right and left arm.  The appropriateness of creating fistula sooner than later.  He wishes to consider this but does not wish to commit to fistula creation at this time.   A total of 30 minutes was spent with this patient and greater than 50% was spent in counseling and coordination of care with the patient.  Discussion included the treatment options for vascular disease including indications for surgery and intervention.  Also discussed is the appropriate timing of treatment.  In addition medical therapy was discussed.  2. Essential hypertension Continue antihypertensive medications as already ordered, these medications have been reviewed and there are no changes at this time.  3. Asthma, unspecified asthma severity, unspecified whether complicated, unspecified whether persistent Continue pulmonary medications and aerosols as already ordered, these medications have been reviewed and there are no changes at this time.   4. Insulin dependent type 2 diabetes mellitus ([place]) Continue hypoglycemic medications as already ordered, these medications have been reviewed and there are no changes at this time.  Hgb A1C to be monitored as already arranged by primary service  5. Mixed hyperlipidemia Continue statin  as ordered and reviewed, no changes at this time     [name], MD  04/11/2022 11:18 AM

## 2022-04-12 ENCOUNTER — Other Ambulatory Visit (INDEPENDENT_AMBULATORY_CARE_PROVIDER_SITE_OTHER): Payer: Self-pay | Admitting: Vascular Surgery

## 2022-04-12 DIAGNOSIS — N186 End stage renal disease: Secondary | ICD-10-CM

## 2022-04-13 ENCOUNTER — Encounter (INDEPENDENT_AMBULATORY_CARE_PROVIDER_SITE_OTHER): Payer: Self-pay | Admitting: Vascular Surgery

## 2022-04-13 ENCOUNTER — Telehealth (INDEPENDENT_AMBULATORY_CARE_PROVIDER_SITE_OTHER): Payer: Self-pay

## 2022-04-13 ENCOUNTER — Ambulatory Visit (INDEPENDENT_AMBULATORY_CARE_PROVIDER_SITE_OTHER): Payer: Medicaid Other

## 2022-04-13 ENCOUNTER — Ambulatory Visit (INDEPENDENT_AMBULATORY_CARE_PROVIDER_SITE_OTHER): Payer: Medicaid Other | Admitting: Vascular Surgery

## 2022-04-13 VITALS — BP 127/84 | HR 92 | Resp 16

## 2022-04-13 DIAGNOSIS — N289 Disorder of kidney and ureter, unspecified: Secondary | ICD-10-CM | POA: Insufficient documentation

## 2022-04-13 DIAGNOSIS — I10 Essential (primary) hypertension: Secondary | ICD-10-CM | POA: Diagnosis not present

## 2022-04-13 DIAGNOSIS — E782 Mixed hyperlipidemia: Secondary | ICD-10-CM

## 2022-04-13 DIAGNOSIS — J45909 Unspecified asthma, uncomplicated: Secondary | ICD-10-CM

## 2022-04-13 DIAGNOSIS — N186 End stage renal disease: Secondary | ICD-10-CM

## 2022-04-13 DIAGNOSIS — Z794 Long term (current) use of insulin: Secondary | ICD-10-CM

## 2022-04-13 DIAGNOSIS — E119 Type 2 diabetes mellitus without complications: Secondary | ICD-10-CM

## 2022-04-13 NOTE — Telephone Encounter (Signed)
Patient was seen in office today and scheduled for a permcath removal with Dr. Delana Meyer on 04/18/22 with a 2:15 pm arrival time to the [facility]. Pre-procedure instructions were discussed and handed to the patient.

## 2022-04-17 ENCOUNTER — Telehealth (INDEPENDENT_AMBULATORY_CARE_PROVIDER_SITE_OTHER): Payer: Self-pay | Admitting: Nurse Practitioner

## 2022-04-17 NOTE — Telephone Encounter (Signed)
Patient called and stated his bandage came off where he had surgery at in the knee and the stitched are showing so he wants to know what to do.  Please advise.

## 2022-04-17 NOTE — Telephone Encounter (Signed)
We didn't do any knee surgery on him, he needs to contact that surgeon's office

## 2022-04-17 NOTE — Telephone Encounter (Signed)
Patient stated it was his chest not his knee. Its where his port is and the tape came off. He said he has an appt tomorrow here. I told him to put a piece a tape back over it uintil tomorrow and Dr. Waynard Edwards agreed.

## 2022-04-18 ENCOUNTER — Encounter
Admission: RE | Disposition: A | Discharge status: Home / Self Care | Payer: Self-pay | Source: Home / Self Care | Attending: Vascular Surgery

## 2022-04-18 ENCOUNTER — Ambulatory Visit
Admission: RE | Admit: 2022-04-18 | Discharge: 2022-04-18 | Disposition: A | Discharge status: Home / Self Care | Payer: Medicaid Other | Attending: Vascular Surgery | Admitting: Vascular Surgery

## 2022-04-18 DIAGNOSIS — Z95828 Presence of other vascular implants and grafts: Secondary | ICD-10-CM | POA: Diagnosis not present

## 2022-04-18 DIAGNOSIS — Z4901 Encounter for fitting and adjustment of extracorporeal dialysis catheter: Secondary | ICD-10-CM | POA: Diagnosis present

## 2022-04-18 DIAGNOSIS — N186 End stage renal disease: Secondary | ICD-10-CM | POA: Diagnosis not present

## 2022-04-18 DIAGNOSIS — Z992 Dependence on renal dialysis: Secondary | ICD-10-CM

## 2022-04-18 HISTORY — PX: DIALYSIS/PERMA CATHETER REMOVAL: CATH118289

## 2022-04-18 SURGERY — DIALYSIS/PERMA CATHETER REMOVAL
Anesthesia: LOCAL

## 2022-04-18 MED ORDER — LIDOCAINE-EPINEPHRINE (PF) 1 %-1:200000 IJ SOLN
INTRAMUSCULAR | Status: DC | PRN
  Administered 2022-04-18: 20 mL via INTRADERMAL

## 2022-04-18 SURGICAL SUPPLY — 4 items
APL PRP STRL LF DISP 70% ISPRP (MISCELLANEOUS) ×2
CHLORAPREP W/TINT 26 (MISCELLANEOUS) IMPLANT
FORCEPS HALSTEAD CVD 5IN STRL (INSTRUMENTS) IMPLANT
TRAY LACERAT/PLASTIC (MISCELLANEOUS) IMPLANT

## 2022-04-18 NOTE — Discharge Instructions (Signed)
Tunneled Catheter Removal, Care After Refer to this sheet in the next few weeks. These instructions provide you with information about caring for yourself after your procedure. Your health care provider may also give you more specific instructions. Your treatment has been planned according to current medical practices, but problems sometimes occur. Call your health care provider if you have any problems or questions after your procedure. What can I expect after the procedure? After the procedure, it is common to have: Some mild redness, swelling, and pain around your catheter site.   Follow these instructions at home: Incision care  Check your removal site  every day for signs of infection. Check for: More redness, swelling, or pain. More fluid or blood. Warmth. Pus or a bad smell. Remove your dressing in 48hrs leave open to air  Activity  Return to your normal activities as told by your health care provider. Ask your health care provider what activities are safe for you. Do not lift anything that is heavier than 10 lb (4.5 kg) for 3 days  You may shower tomorrow  Contact a health care provider if: You have more fluid or blood coming from your removal site You have more redness, swelling, or pain at your incisions or around the area where your catheter was removed Your removal site feel warm to the touch. You feel unusually weak. You feel nauseous.. Get help right away if You have swelling in your arm, shoulder, neck, or face. You develop chest pain. You have difficulty breathing. You feel dizzy or light-headed. You have pus or a bad smell coming from your removal site You have a fever. You develop bleeding from your removal site, and your bleeding does not stop. This information is not intended to replace advice given to you by your health care provider. Make sure you discuss any questions you have with your health care provider. Document Released: 01/10/2012 Document Revised:  09/26/2015 Document Reviewed: 10/19/2014 Elsevier Interactive Patient Education  2017 [employer] 

## 2022-04-18 NOTE — Op Note (Signed)
  OPERATIVE NOTE   PROCEDURE: Removal of a left IJ tunneled dialysis catheter  PRE-OPERATIVE DIAGNOSIS: Complication of dialysis catheter, End stage renal disease  POST-OPERATIVE DIAGNOSIS: Same  SURGEON: [name], M.D.  Assistant:  Annalee Genta, NP  ANESTHESIA: Local anesthetic with 1% lidocaine with epinephrine   ESTIMATED BLOOD LOSS: Minimal   FINDING(S): 1. Catheter intact   SPECIMEN(S):  Catheter  INDICATIONS:   Cory Campbell is a 42 y.o. male who presents with a left IJ tunneled dialysis catheter that is not being used anymore.  The patient has undergone placement of an extremity access which is working and this has been successfully cannulated without difficulty.  therefore is undergoing removal of his tunneled catheter which is no longer needed to avoid septic complications.   DESCRIPTION: After obtaining full informed written consent, the patient was positioned supine. The Left IJ catheter and surrounding area is prepped and draped in a sterile fashion. The cuff was localized by palpation and noted to be less than 3 cm from the exit site. After appropriate timeout is called, 1% lidocaine with epinephrine is infiltrated into the surrounding tissues around the cuff. Small transverse incision is created at the exit site with an 11 blade scalpel and the dissection was carried up along the catheter to expose the cuff of the tunneled catheter.  The catheter cuff is then freed from the surrounding attachments and adhesions. Once the catheter has been freed circumferentially it is removed in 1 piece. Light pressure was held at the base of the neck.   Antibiotic ointment and a sterile dressing is applied to the exit site. Patient tolerated procedure well and there were no complications.  COMPLICATIONS: None  CONDITION: Unchanged  [name], M.D. [facility] Office: [phone]  04/18/2022,6:50 PM

## 2022-04-19 ENCOUNTER — Encounter: Payer: Self-pay | Admitting: Vascular Surgery

## 2022-04-29 ENCOUNTER — Other Ambulatory Visit: Payer: Self-pay | Admitting: Cardiovascular Disease

## 2022-05-01 NOTE — Progress Notes (Unsigned)
Cardiology Office Note:    Date:  05/02/2022   ID:  Cory Campbell, DOB 1980-04-23, MRN [id number]  PCP:  Donnie Coffin, MD   [place] Providers Cardiologist:  Ida Rogue, MD     Referring MD: Donnie Coffin, MD   CC: follow up for HTN and HFpEF.   History of Present Illness:    Cory Campbell is a 42 y.o. male with a hx of hypertension (since 42 years old), arthrogryposis multiplex congenita, postural dizziness with presyncope, chronic diastolic heart failure, OSA on CPAP, asthma, history of pancreatitis, DM 2 insulin-dependent, seizure disorder, BPH, ESRD, HIV, depression, anxiety, HLD.  He had an echo in September 2018 which showed an EF of 50 to 55%, no significant valvular abnormalities.  Lexiscan in December 2018 showed a small defect of moderate severity present in the apical inferior location, felt to be due to diaphragm attenuation, overall normal study.  He also wore a Holter monitor for 48 hours which showed predominantly sinus rhythm with rare PVCs.  Most recently evaluated in office on 01/17/2022 by Christell Faith, PA, at that time he was doing well from a cardiac perspective, his blood pressure was managed and he had no further incidents of atypical chest pain.  He was hospitalized in January 2024 and was started on dialysis for AKI.  He has not required dialysis as an outpatient.He was evaluated in the emergency department on 04/18/2022 to have his tunneled IJ HD catheter removed, as he had recently had placement in an extremity and it was functioning well.  He was evaluated by nephrology at [facility] on 04/19/2022, his blood pressure was well-controlled at that time.  He presents today for follow-up of his hypertension.  He has been doing well from a cardiac perspective and had no further incidents of atypical chest pain.  He continues to work at [employer].  He checks his blood pressure daily and reports that the blood pressure readings are typically in the [id number]  range systolic.  He has some questions regarding his heart failure status, reviewed his most recent echo in 2018 with him.  We discussed repeating echo, however he states he feels okay today and does not wish to pursue this at this time.  He denies chest pain, palpitations, dyspnea, pnd, orthopnea, n, v, dizziness, syncope, edema, weight gain, or early satiety.    Past Medical History:  Diagnosis Date   (HFpEF) heart failure with preserved ejection fraction (HCC)    Acid reflux    Adopted    Anxiety    Arthritis    Arthrogryposis    Asthma    BPH (benign prostatic hyperplasia)    Bronchitis    Chronic pain syndrome    Depressive disorder, not elsewhere classified    Diabetes mellitus without complication ([place])    Diverticulitis of colon (without mention of hemorrhage)(562.11)    ED (erectile dysfunction)    Herpes genitalis    HIV infection ([place])    HTN (hypertension)    Hyperlipidemia    Hypertensive cardiomyopathy ([place])    Hypogonadism in male    OAB (overactive bladder)    Other psoriasis    Seizure disorder (HCC)    Sleep apnea    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 or other cause, without mention of thyrotoxic crisis or storm    hyperthyroidism    Past Surgical History:  Procedure Laterality Date   APPENDECTOMY     COLOSTOMY     CORNEAL TRANSPLANT  DIALYSIS/PERMA CATHETER INSERTION Right 03/10/2022   Procedure: DIALYSIS/PERMA CATHETER INSERTION;  Surgeon: Katha Cabal, MD;  Location: [facility];  Service: Cardiovascular;  Laterality: Right;   DIALYSIS/PERMA CATHETER REMOVAL N/A 04/18/2022   Procedure: DIALYSIS/PERMA CATHETER REMOVAL;  Surgeon: Katha Cabal, MD;  Location: [facility];  Service: Cardiovascular;  Laterality: N/A;   feet surgery     both   HAND SURGERY     left and right   leg surgery     left and right   ORTHOPEDIC SURGERY     hands, feet, knees, legs   tubes in ears     both    Current Medications: Current  Meds  Medication Sig   acyclovir (ZOVIRAX) 200 MG capsule Take 1 capsule (200 mg total) by mouth 2 (two) times daily.   albuterol (PROVENTIL HFA;VENTOLIN HFA) 108 (90 BASE) MCG/ACT inhaler Inhale 2 puffs into the lungs every 4 (four) hours as needed for wheezing or shortness of breath.    albuterol (PROVENTIL) (2.5 MG/3ML) 0.083% nebulizer solution Take 2.5 mg by nebulization every 4 (four) hours as needed for wheezing or shortness of breath.   amLODipine (NORVASC) 10 MG tablet Take 10 mg by mouth daily.   BIKTARVY 50-200-25 MG TABS tablet Take 1 tablet by mouth daily.   buprenorphine (BUTRANS) 7.5 MCG/HR Place 7.5 mg onto the skin once a week. Saturday   buPROPion (WELLBUTRIN XL) 150 MG 24 hr tablet Take 150 mg by mouth daily.   cetirizine (ZYRTEC) 10 MG tablet Take 10 mg by mouth daily.   citalopram (CELEXA) 20 MG tablet Take 20 mg by mouth daily.   diclofenac sodium (VOLTAREN) 1 % GEL Apply 2-4 g topically 4 (four) times daily as needed. For pain.   dicyclomine (BENTYL) 10 MG capsule Take 20 mg by mouth 2 (two) times daily before a meal.   Elastic Bandages & Supports (T.E.D. BELOW KNEE/MEDIUM) MISC 1 each by Does not apply route daily. Measure legs and give appropriate size.   famotidine (PEPCID) 20 MG tablet Take 20 mg by mouth daily.    feeding supplement, GLUCERNA SHAKE, (GLUCERNA SHAKE) LIQD Take 237 mLs by mouth 4 (four) times daily.   ferrous sulfate 325 (65 FE) MG tablet Take 325 mg by mouth daily with breakfast.   finasteride (PROSCAR) 5 MG tablet Take 5 mg by mouth daily.   fluticasone (FLONASE) 50 MCG/ACT nasal spray Place 2 sprays into both nostrils daily.   fluticasone (FLOVENT HFA) 110 MCG/ACT inhaler Inhale 2 puffs into the lungs 2 (two) times daily.   glucose 4 GM chewable tablet Chew 1 tablet by mouth as needed for low blood sugar.   hydrALAZINE (APRESOLINE) 50 MG tablet Take 1 tablet (50 mg total) by mouth 3 (three) times daily.   insulin degludec (TRESIBA) 100 UNIT/ML  FlexTouch Pen Inject 100 Units into the skin 2 (two) times daily.   ipratropium (ATROVENT) 0.06 % nasal spray Place 2 sprays into both nostrils 4 (four) times daily.   isosorbide dinitrate (ISORDIL) 20 MG tablet Take 1 tablet (20 mg total) by mouth 3 (three) times daily.   ketoconazole (NIZORAL) 2 % shampoo Apply 1 application topically 2 (two) times a week. tues and thursday   Melatonin 5 MG TABS Take 10 mg by mouth at bedtime as needed (sleep).   Metoprolol Tartrate 75 MG TABS Take 1 tablet (75 mg total) by mouth 2 (two) times daily.   mirtazapine (REMERON) 45 MG tablet Take 45 mg  by mouth at bedtime.   Multiple Vitamin (MULTIVITAMIN) tablet Take 1 tablet by mouth daily.   nitroGLYCERIN (NITROSTAT) 0.4 MG SL tablet PLACE 1 TABLET SUBLINGUALLY EVERY 5 MINUTES AS NEEDED FOR CHEST PAIN. CALL 911 IF YOU HAVE TAKEN 3 NITRO FOR CHEST PAIN   nortriptyline (PAMELOR) 25 MG capsule Take 50 mg by mouth at bedtime.   omega-3 acid ethyl esters (LOVAZA) 1 g capsule Take 1 g by mouth 3 (three) times daily.   omeprazole (PRILOSEC) 20 MG capsule Take 20 mg by mouth daily.   ondansetron (ZOFRAN-ODT) 4 MG disintegrating tablet Take 4 mg by mouth every 8 (eight) hours as needed for nausea or vomiting.   polyethylene glycol (MIRALAX / GLYCOLAX) 17 g packet Take 17 g by mouth 3 (three) times daily.   rosuvastatin (CRESTOR) 40 MG tablet TAKE 1 TABLET(40 MG) BY MOUTH DAILY   solifenacin (VESICARE) 10 MG tablet Take 10 mg by mouth daily.   Spacer/Aero-Holding Chambers (AEROCHAMBER PLUS) inhaler Use as instructed   spironolactone (ALDACTONE) 50 MG tablet Take 100 mg by mouth daily.   tiZANidine (ZANAFLEX) 4 MG tablet Take 1 tablet (4 mg total) by mouth 3 (three) times daily.   TRULICITY 4.5 0000000 SOPN SMARTSIG:4.5 Milligram(s) SUB-Q Once a Week     Allergies:   Penicillins and Erythromycin base   Social History   Socioeconomic History   Marital status: Married    Spouse name: Not on file   Number of  children: Not on file   Years of education: Not on file   Highest education level: Not on file  Occupational History   Occupation: Scientist, water quality  Tobacco Use   Smoking status: Former    Types: Pipe, Landscape architect, E-cigarettes    Quit date: 01/2018    Years since quitting: 4.3   Smokeless tobacco: Never   Tobacco comments:    Declines patch  Vaping Use   Vaping Use: Former   Substances: Nicotine, Flavoring  Substance and Sexual Activity   Alcohol use: Not Currently    Alcohol/week: 3.0 standard drinks of alcohol    Types: 3 Cans of beer per week   Drug use: No   Sexual activity: Not on file  Other Topics Concern   Not on file  Social History Narrative   Uses crutches to ambulate   Social Determinants of Health   Financial Resource Strain: Not on file  Food Insecurity: No Food Insecurity (11/24/2021)   Hunger Vital Sign    Worried About Running Out of Food in the Last Year: Never true    [place] in the Last Year: Never true  Transportation Needs: No Transportation Needs (11/24/2021)   PRAPARE - Hydrologist (Medical): No    Lack of Transportation (Non-Medical): No  Physical Activity: Not on file  Stress: Not on file  Social Connections: Not on file     Family History: The patient's He was adopted. Family history is unknown by patient.  ROS:   Please see the history of present illness.     All other systems reviewed and are negative.  EKGs/Labs/Other Studies Reviewed:    The following studies were reviewed today:  ABIs 09/13/2021: ABI/TBIToday's ABIToday's TBIPrevious ABIPrevious TBI  +-------+-----------+-----------+------------+------------+  Right 1.04       1.02       1.08        0.98          +-------+-----------+-----------+------------+------------+  Left  1.10  1.03       1.13        0.86          +-------+-----------+-----------+------------+------------+    Bilateral ABIs and TBIs appear essentially  unchanged compared to prior  study on 06/2020.    Summary:  Right: Resting right ankle-brachial index is within normal range. No  evidence of significant right lower extremity arterial disease. The right  toe-brachial index is normal.   Left: Resting left ankle-brachial index is within normal range. No  evidence of significant left lower extremity arterial disease.   48-hour Holter 01/2017: Normal sinus rhythm with rare PVCs     Lexiscan MPI 01/22/2017: T wave inversion was noted during stress in the I, II, aVF, V6 and V5 leads. There was no ST segment deviation noted during stress. Defect 1: There is a small defect of moderate severity present in the apical inferior location. This is likely due to diaphragm attenuation The study is normal. This is a low risk study. The left ventricular ejection fraction is normal (55-65%). Suboptimal study due to diaphragm attenuation and extracardiac activity.  2D echo 10/08/2016: - Left ventricle: Distal septal apical hypokinesis. The cavity size    was normal. Systolic function was normal. The estimated ejection    fraction was in the range of 50% to 55%. Wall motion was normal;    there were no regional wall motion abnormalities.  - Atrial septum: No defect or patent foramen ovale was identified.   08/03/2009 renal ultrasound-right kidney was 11 cm left kidney was 11.7 cm  EKG:  EKG is not ordered today.   Recent Labs: 11/24/2021: B Natriuretic Peptide 22.6 03/08/2022: ALT 21; Magnesium 2.2 03/15/2022: Hemoglobin 11.2; Platelets 404 04/07/2022: BUN 27; Creatinine, Ser 1.54; Potassium 5.1; Sodium 134  Recent Lipid Panel    Component Value Date/Time   CHOL 163 10/07/2016 0503   TRIG 311 (H) 11/24/2021 0717   HDL 26 (L) 10/07/2016 0503   CHOLHDL 6.3 10/07/2016 0503   VLDL 25 10/07/2016 0503   LDLCALC 112 (H) 10/07/2016 0503     Risk Assessment/Calculations:            Physical Exam:    VS:  BP (!) 133/95 (BP Location: Left Arm,  Patient Position: Sitting, Cuff Size: Normal)   Pulse 86   Ht 5\' 6"  (1.676 m)   Wt 170 lb (77.1 kg)   SpO2 100%   BMI 27.44 kg/m     Wt Readings from Last 3 Encounters:  05/02/22 170 lb (77.1 kg)  04/07/22 151 lb 0.2 oz (68.5 kg)  03/14/22 151 lb 0.2 oz (68.5 kg)     GEN:  Well nourished, well developed in no acute distress HEENT: Normal NECK: No JVD; No carotid bruits LYMPHATICS: No lymphadenopathy CARDIAC: RRR, no murmurs, rubs, gallops RESPIRATORY:  Clear to auscultation without rales, wheezing or rhonchi  ABDOMEN: Soft, non-tender, non-distended MUSCULOSKELETAL:  No edema; No deformity  SKIN: Warm and dry NEUROLOGIC:  Alert and oriented x 3 PSYCHIATRIC:  Normal affect   ASSESSMENT:    1. End stage renal disease ([place])   2. Chronic heart failure with preserved ejection fraction ([place])   3. Atypical chest pain   4. Mixed hyperlipidemia   5. Type 2 diabetes mellitus with complication, without long-term current use of insulin ([place])   6. Essential hypertension    PLAN:    In order of problems listed above:  ESRD - Followed by nephrology at [facility], during most recent hospitalization he had  undergo HD via temporary cath, however his kidney function has since stabilized.  Most recent creatinine 1.54.  HFpEF - echo in 2018 EF 50 - 55% with distal septal apical hypokinesis, NYHA class I, euvolemic today. Jardiance was d/c'd by nephrology. Continue metoprolol, aldactone. Further GDMT prohibited by kidney function. Discussed repeating his echo as he initially expressed some concerns, however he is feeling well overall and wanted to think about it further.   HTN - BP today 133/95, was 130/90 at home. Continue current antihypertensive medication regimen. Instructed him to let us know if his BP gets higher. Per nephro, ok to restart lisinopril if needed at their last visit.   HLD - LDL on 09/22/21 was well controlled at 61. Continue rosuvastatin.  DM2 insulin dependent - managed by  endocrinology.     Disposition-keep follow-up appointment with Dr. Rockey Situ in June.      Medication Adjustments/Labs and Tests Ordered: Current medicines are reviewed at length with the patient today.  Concerns regarding medicines are outlined above.  No orders of the defined types were placed in this encounter.  No orders of the defined types were placed in this encounter.   Patient Instructions  Medication Instructions:  No changes at this time.   *If you need a refill on your cardiac medications before your next appointment, please call your pharmacy*   Lab Work: None  If you have labs (blood work) drawn today and your tests are completely normal, you will receive your results only by: [place] (if you have MyChart) OR A paper copy in the mail If you have any lab test that is abnormal or we need to change your treatment, we will call you to review the results.   Testing/Procedures: None   Follow-Up: At [facility], you and your health needs are our priority.  As part of our continuing mission to provide you with exceptional heart care, we have created designated Provider Care Teams.  These Care Teams include your primary Cardiologist (physician) and Advanced Practice Providers (APPs -  Physician Assistants and Nurse Practitioners) who all work together to provide you with the care you need, when you need it.  Your next appointment:   Keep scheduled appointment  Provider:   Ida Rogue, MD or Christell Faith, PA-C       Signed, Trudi Ida, NP  05/02/2022 1:16 PM    [place]

## 2022-05-02 ENCOUNTER — Ambulatory Visit: Payer: Medicaid Other | Attending: Physician Assistant | Admitting: Cardiology

## 2022-05-02 ENCOUNTER — Encounter: Payer: Self-pay | Admitting: Physician Assistant

## 2022-05-02 VITALS — BP 133/95 | HR 86 | Ht 66.0 in | Wt 170.0 lb

## 2022-05-02 DIAGNOSIS — E782 Mixed hyperlipidemia: Secondary | ICD-10-CM | POA: Diagnosis not present

## 2022-05-02 DIAGNOSIS — I10 Essential (primary) hypertension: Secondary | ICD-10-CM

## 2022-05-02 DIAGNOSIS — R0789 Other chest pain: Secondary | ICD-10-CM

## 2022-05-02 DIAGNOSIS — I5032 Chronic diastolic (congestive) heart failure: Secondary | ICD-10-CM | POA: Diagnosis not present

## 2022-05-02 DIAGNOSIS — N186 End stage renal disease: Secondary | ICD-10-CM | POA: Diagnosis not present

## 2022-05-02 DIAGNOSIS — E118 Type 2 diabetes mellitus with unspecified complications: Secondary | ICD-10-CM | POA: Diagnosis present

## 2022-05-02 NOTE — Patient Instructions (Signed)
Medication Instructions:  No changes at this time.   *If you need a refill on your cardiac medications before your next appointment, please call your pharmacy*   Lab Work: None  If you have labs (blood work) drawn today and your tests are completely normal, you will receive your results only by: [place] (if you have MyChart) OR A paper copy in the mail If you have any lab test that is abnormal or we need to change your treatment, we will call you to review the results.   Testing/Procedures: None   Follow-Up: At [facility], you and your health needs are our priority.  As part of our continuing mission to provide you with exceptional heart care, we have created designated Provider Care Teams.  These Care Teams include your primary Cardiologist (physician) and Advanced Practice Providers (APPs -  Physician Assistants and Nurse Practitioners) who all work together to provide you with the care you need, when you need it.  Your next appointment:   Keep scheduled appointment  Provider:   Ida Rogue, MD or Christell Faith, PA-C

## 2022-05-11 ENCOUNTER — Other Ambulatory Visit: Payer: Self-pay | Admitting: Cardiovascular Disease

## 2022-07-04 ENCOUNTER — Ambulatory Visit
Admission: EM | Admit: 2022-07-04 | Discharge: 2022-07-04 | Disposition: A | Discharge status: Home / Self Care | Payer: Medicaid Other | Attending: Emergency Medicine | Admitting: Emergency Medicine

## 2022-07-04 DIAGNOSIS — J0140 Acute pansinusitis, unspecified: Secondary | ICD-10-CM | POA: Insufficient documentation

## 2022-07-04 DIAGNOSIS — Z20822 Contact with and (suspected) exposure to covid-19: Secondary | ICD-10-CM | POA: Diagnosis present

## 2022-07-04 DIAGNOSIS — H9202 Otalgia, left ear: Secondary | ICD-10-CM | POA: Diagnosis present

## 2022-07-04 LAB — SARS CORONAVIRUS 2 BY RT PCR: SARS Coronavirus 2 by RT PCR: NEGATIVE

## 2022-07-04 MED ORDER — DOXYCYCLINE HYCLATE 100 MG PO CAPS: 100.0000 mg | ORAL_CAPSULE | Freq: Two times a day (BID) | ORAL | 0 refills | Status: AC

## 2022-07-04 MED ORDER — FLUTICASONE PROPIONATE 50 MCG/ACT NA SUSP: 2.0000 | Freq: Every day | NASAL | 0 refills | Status: AC

## 2022-07-04 NOTE — ED Triage Notes (Signed)
Pt c/o possible sinus infection x2days  Pt states he is having drainage in the back of his throat, facial pain, left ear pain.

## 2022-07-04 NOTE — ED Provider Notes (Signed)
HPI  SUBJECTIVE:  Cory Campbell is a 42 y.o. male who presents with 2 days of left ear pain, sinus pain and pressure, postnasal drip, sore throat, nasal congestion, occasional dry cough.  His wife is sick with similar symptoms.  He had a negative home COVID test yesterday.  No change in hearing, otorrhea, fevers, rhinorrhea, facial swelling, dental pain, tinnitus, vertigo, wheezing, shortness of breath, nausea, vomiting, diarrhea, abdominal pain.  No known COVID or flu exposure.  No antipyretic in the past 6 hours.  No antibiotics in the past month.  He tried Tylenol with improvement in his symptoms.  No aggravating factors.  Patient has a complex past medical history including diabetes, HSV, HIV, hypertension, hyperlipidemia, hypertensive cardiomyopathy, end-stage renal disease, seizures, hypothyroidism, COPD/asthma, aretrogryposis, CHF recurrent ear infections that required bilateral tympanoplasty tubes 4-5 years ago.  States tube in his left ear is still present had Port-A-Cath removed on 04/18/2022.  [facility]  Past Medical History:  Diagnosis Date   (HFpEF) heart failure with preserved ejection fraction (HCC)    Acid reflux    Adopted    Anxiety    Arthritis    Arthrogryposis    Asthma    BPH (benign prostatic hyperplasia)    Bronchitis    Chronic pain syndrome    Depressive disorder, not elsewhere classified    Diabetes mellitus without complication (HCC)    Diverticulitis of colon (without mention of hemorrhage)(562.11)    ED (erectile dysfunction)    Herpes genitalis    HIV infection (HCC)    HTN (hypertension)    Hyperlipidemia    Hypertensive cardiomyopathy (HCC)    Hypogonadism in male    OAB (overactive bladder)    Other psoriasis    Seizure disorder (HCC)    Sleep apnea    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 or other cause, without mention of thyrotoxic crisis or storm    hyperthyroidism    Past Surgical History:  Procedure Laterality Date    APPENDECTOMY     COLOSTOMY     CORNEAL TRANSPLANT     DIALYSIS/PERMA CATHETER INSERTION Right 03/10/2022   Procedure: DIALYSIS/PERMA CATHETER INSERTION;  Surgeon: Renford Dills, MD;  Location: [facility];  Service: Cardiovascular;  Laterality: Right;   DIALYSIS/PERMA CATHETER REMOVAL N/A 04/18/2022   Procedure: DIALYSIS/PERMA CATHETER REMOVAL;  Surgeon: Renford Dills, MD;  Location: [facility];  Service: Cardiovascular;  Laterality: N/A;   feet surgery     both   HAND SURGERY     left and right   leg surgery     left and right   ORTHOPEDIC SURGERY     hands, feet, knees, legs   tubes in ears     both    Family History  Adopted: Yes  Family history unknown: Yes    Social History   Tobacco Use   Smoking status: Former    Types: Pipe, Software engineer, E-cigarettes    Quit date: 01/2018    Years since quitting: 4.4   Smokeless tobacco: Never   Tobacco comments:    Declines patch  Vaping Use   Vaping Use: Former   Substances: Nicotine, Flavoring  Substance Use Topics   Alcohol use: Not Currently    Alcohol/week: 3.0 standard drinks of alcohol    Types: 3 Cans of beer per week   Drug use: No    No current facility-administered medications for this encounter.  Current Outpatient Medications:    acyclovir (ZOVIRAX)  200 MG capsule, Take 1 capsule (200 mg total) by mouth 2 (two) times daily., Disp: 60 capsule, Rfl: 30   albuterol (PROVENTIL HFA;VENTOLIN HFA) 108 (90 BASE) MCG/ACT inhaler, Inhale 2 puffs into the lungs every 4 (four) hours as needed for wheezing or shortness of breath. , Disp: , Rfl:    albuterol (PROVENTIL) (2.5 MG/3ML) 0.083% nebulizer solution, Take 2.5 mg by nebulization every 4 (four) hours as needed for wheezing or shortness of breath., Disp: , Rfl:    amLODipine (NORVASC) 10 MG tablet, Take 10 mg by mouth daily., Disp: , Rfl:    buprenorphine (BUTRANS) 7.5 MCG/HR, Place 7.5 mg onto the skin once a week. Saturday, Disp: , Rfl:     buPROPion (WELLBUTRIN XL) 150 MG 24 hr tablet, Take 150 mg by mouth daily., Disp: , Rfl:    CABENUVA 600 & 900 MG/3ML injection, INJECT A TOTAL OF 6 ML IN THE MUSCLE ONCE A MONTH FOR 2 MONTHS THEN USE EVERY 2 MONTHS THEREAFTER., Disp: , Rfl:    cetirizine (ZYRTEC) 10 MG tablet, Take 10 mg by mouth daily., Disp: , Rfl:    citalopram (CELEXA) 20 MG tablet, Take 20 mg by mouth daily., Disp: , Rfl:    diclofenac sodium (VOLTAREN) 1 % GEL, Apply 2-4 g topically 4 (four) times daily as needed. For pain., Disp: , Rfl:    dicyclomine (BENTYL) 10 MG capsule, Take 20 mg by mouth 2 (two) times daily before a meal., Disp: , Rfl:    doxycycline (VIBRAMYCIN) 100 MG capsule, Take 1 capsule (100 mg total) by mouth 2 (two) times daily for 10 days., Disp: 20 capsule, Rfl: 0   Elastic Bandages & Supports (T.E.D. BELOW KNEE/MEDIUM) MISC, 1 each by Does not apply route daily. Measure legs and give appropriate size., Disp: 3 each, Rfl: 0   famotidine (PEPCID) 20 MG tablet, Take 20 mg by mouth daily. , Disp: , Rfl:    feeding supplement, GLUCERNA SHAKE, (GLUCERNA SHAKE) LIQD, Take 237 mLs by mouth 4 (four) times daily., Disp: , Rfl:    ferrous sulfate 325 (65 FE) MG tablet, Take 325 mg by mouth daily with breakfast., Disp: , Rfl:    finasteride (PROSCAR) 5 MG tablet, Take 5 mg by mouth daily., Disp: , Rfl:    fluticasone (FLONASE) 50 MCG/ACT nasal spray, Place 2 sprays into both nostrils daily., Disp: 16 g, Rfl: 0   fluticasone (FLOVENT HFA) 110 MCG/ACT inhaler, Inhale 2 puffs into the lungs 2 (two) times daily., Disp: , Rfl:    glucose 4 GM chewable tablet, Chew 1 tablet by mouth as needed for low blood sugar., Disp: , Rfl:    hydrALAZINE (APRESOLINE) 50 MG tablet, Take 1 tablet (50 mg total) by mouth 3 (three) times daily., Disp: 90 tablet, Rfl: 1   insulin degludec (TRESIBA) 100 UNIT/ML FlexTouch Pen, Inject 100 Units into the skin 2 (two) times daily., Disp: , Rfl:    ipratropium (ATROVENT) 0.06 % nasal spray, Place 2  sprays into both nostrils 4 (four) times daily., Disp: 15 mL, Rfl: 12   isosorbide dinitrate (ISORDIL) 20 MG tablet, Take 1 tablet (20 mg total) by mouth 3 (three) times daily., Disp: 60 tablet, Rfl: 1   ketoconazole (NIZORAL) 2 % shampoo, Apply 1 application topically 2 (two) times a week. tues and thursday, Disp: , Rfl:    Melatonin 5 MG TABS, Take 10 mg by mouth at bedtime as needed (sleep)., Disp: , Rfl:    Metoprolol Tartrate 75 MG TABS,  Take 1 tablet (75 mg total) by mouth 2 (two) times daily., Disp: 60 tablet, Rfl: 1   mirtazapine (REMERON) 45 MG tablet, Take 45 mg by mouth at bedtime., Disp: , Rfl:    Multiple Vitamin (MULTIVITAMIN) tablet, Take 1 tablet by mouth daily., Disp: , Rfl:    nitroGLYCERIN (NITROSTAT) 0.4 MG SL tablet, PLACE 1 TABLET UNDER THE TONGUE EVERY 5 MINUTES AS NEEDED FOR CHEST PAIN. CALL 911 IF YOU HAVE TAKEN 3 NITRO FOR CHEST PAIN, Disp: 25 tablet, Rfl: 1   nortriptyline (PAMELOR) 25 MG capsule, Take 50 mg by mouth at bedtime., Disp: , Rfl:    omega-3 acid ethyl esters (LOVAZA) 1 g capsule, Take 1 g by mouth 3 (three) times daily., Disp: , Rfl:    omeprazole (PRILOSEC) 20 MG capsule, Take 20 mg by mouth daily., Disp: , Rfl:    ondansetron (ZOFRAN-ODT) 4 MG disintegrating tablet, Take 4 mg by mouth every 8 (eight) hours as needed for nausea or vomiting., Disp: , Rfl:    polyethylene glycol (MIRALAX / GLYCOLAX) 17 g packet, Take 17 g by mouth 3 (three) times daily., Disp: , Rfl:    rosuvastatin (CRESTOR) 40 MG tablet, TAKE 1 TABLET(40 MG) BY MOUTH DAILY, Disp: 90 tablet, Rfl: 0   solifenacin (VESICARE) 10 MG tablet, Take 10 mg by mouth daily., Disp: , Rfl:    Spacer/Aero-Holding Chambers (AEROCHAMBER PLUS) inhaler, Use as instructed, Disp: 1 each, Rfl: 2   spironolactone (ALDACTONE) 50 MG tablet, Take 100 mg by mouth daily., Disp: , Rfl:    tiZANidine (ZANAFLEX) 4 MG tablet, Take 1 tablet (4 mg total) by mouth 3 (three) times daily., Disp: 90 tablet, Rfl: 11   aspirin EC  81 MG tablet, Take 81 mg by mouth daily. Swallow whole., Disp: , Rfl:    BIKTARVY 50-200-25 MG TABS tablet, Take 1 tablet by mouth daily., Disp: , Rfl: 1   empagliflozin (JARDIANCE) 25 MG TABS tablet, Take 25 mg by mouth daily., Disp: , Rfl:    gabapentin (NEURONTIN) 300 MG capsule, Take 1 capsule (300 mg total) by mouth 2 (two) times daily. (Patient taking differently: Take 300 mg by mouth 3 (three) times daily.), Disp: 60 capsule, Rfl: 0   TRULICITY 4.5 MG/0.5ML SOPN, SMARTSIG:4.5 Milligram(s) SUB-Q Once a Week, Disp: , Rfl:   Allergies  Allergen Reactions   Penicillins Hives, Itching and Rash    Has patient had a PCN reaction causing immediate rash, facial/tongue/throat swelling, SOB or lightheadedness with hypotension: Yes  Has patient had a PCN reaction causing severe rash involving mucus membranes or skin necrosis: No  Has patient had a PCN reaction that required hospitalization No  Has patient had a PCN reaction occurring within the last 10 years: No  If all of the above answers are "NO", then may proceed with Cephalosporin use.  Has tolerated amoxicillin without problems  Has patient had a PCN reaction causing immediate rash, facial/tongue/throat swelling, SOB or lightheadedness with hypotension: Yes    Has patient had a PCN reaction causing severe rash involving mucus membranes or skin necrosis: No    Has patient had a PCN reaction that required hospitalization No    Has patient had a PCN reaction occurring within the last 10 years: No    If all of the above answers are "NO", then may proceed with Cephalosporin use.    Has patient had a PCN reaction causing immediate rash, facial/tongue/throat swelling, SOB or lightheadedness with hypotension: Yes Has patient had a PCN reaction  causing severe rash involving mucus membranes or skin necrosis: No Has patient had a PCN reaction that required hospitalization No Has patient had a PCN reaction occurring within the last 10 years: No  If all of the above answers are "NO", then may proceed with Cephalosporin use.  Has tolerated amoxicillin without problems   Erythromycin Base Itching and Rash     ROS  As noted in HPI.   Physical Exam  BP (!) 131/103 (BP Location: Left Arm)   Pulse (!) 124   Temp (!) 97.4 F (36.3 C) (Oral)   Ht 5\' 6"  (1.676 m)   Wt 70.8 kg   SpO2 100%   BMI 25.18 kg/m   Wt Readings from Last 3 Encounters:  07/04/22 70.8 kg  05/02/22 77.1 kg  04/07/22 68.5 kg   Temp Readings from Last 3 Encounters:  07/04/22 (!) 97.4 F (36.3 C) (Oral)  04/18/22 98.3 F (36.8 C) (Oral)  04/07/22 97.7 F (36.5 C) (Oral)   BP Readings from Last 3 Encounters:  07/04/22 (!) 131/103  05/02/22 (!) 133/95  04/18/22 (!) 127/97   Pulse Readings from Last 3 Encounters:  07/04/22 (!) 124  05/02/22 86  04/18/22 (!) 110    Constitutional: Well developed, well nourished, no acute distress Eyes:  EOMI, conjunctiva normal bilaterally HENT: Normocephalic, atraumatic,mucus membranes moist.  Erythematous, swollen turbinates.  Positive exquisite maxillary, frontal sinus tenderness, worse on the left.  Left ear: Pain with traction on pinna, palpation of tragus. external ear, EAC normal.  Positive wax.  Positive tympanoplasty tube in place.  No otorrhea.  Positive left TMJ tenderness.  Right ear, EAC, TM, TMJ normal, nontender.  Erythematous oropharynx, tonsils normal size without exudates.  Uvula midline.  Positive postnasal drip. Neck: No appreciable cervical lymphadenopathy Respiratory: Normal inspiratory effort, lungs clear bilaterally Cardiovascular: Regular tachycardia, no murmurs rubs or gallops GI: nondistended skin: No rash, skin intact Musculoskeletal: no deformities Neurologic: Alert & oriented x 3, no focal neuro deficits Psychiatric: Speech and behavior appropriate   ED Course   Medications - No data to display  Orders Placed This Encounter  Procedures   SARS Coronavirus 2 by RT PCR (hospital  order, performed in [facility] hospital lab) *cepheid single result test* Anterior Nasal Swab    Standing Status:   Standing    Number of Occurrences:   1    No results found for this or any previous visit (from the past 24 hour(s)). No results found.  ED Clinical Impression  1. Acute non-recurrent pansinusitis   2. Acute otalgia, left   3. Encounter for laboratory testing for COVID-19 virus      ED Assessment/Plan     Previous labs, outside records additional medical history obtained.  Patient presents with acute illness with systemic symptoms of tachycardia.  He states that he has had issues with tachycardia before.  No evidence of sepsis.  Presentation consistent with an acute pansinusitis.  No evidence of otitis media, otitis externa, mastoiditis.  He may have some TMJ arthralgia.  Will send home with doxycycline for 10 days, Flonase, saline nasal irrigation given he has multiple comorbidities.  Tylenol 1000 mg 3 times daily as needed for pain.  Will also check a COVID.  Will prescribe molnupiravir if COVID is positive due to complex medical history.  Will contact patient at [phone].  Discussed labs, MDM, treatment plan, and plan for follow-up with patient. patient agrees with plan.   Meds ordered this encounter  Medications   doxycycline (VIBRAMYCIN)  100 MG capsule    Sig: Take 1 capsule (100 mg total) by mouth 2 (two) times daily for 10 days.    Dispense:  20 capsule    Refill:  0   fluticasone (FLONASE) 50 MCG/ACT nasal spray    Sig: Place 2 sprays into both nostrils daily.    Dispense:  16 g    Refill:  0      *This clinic note was created using Scientist, clinical (histocompatibility and immunogenetics). Therefore, there may be occasional mistakes despite careful proofreading.  ?    Domenick Gong, MD 07/05/22 1343

## 2022-07-04 NOTE — Discharge Instructions (Signed)
Will contact you if and only if your COVID is positive.  If your COVID is positive, we will prescribe molnupiravir.  I am sending you home with a wait-and-see prescription of doxycycline to treat a sinus infection.  This will also cover an ear infection, although I there is no evidence of an ear infection today.  Flonase, saline nasal irrigation with a [employer] sinus rinse and distilled water as often as you want to help rinse out the infection.  May take Tylenol 1000 mg 3 times a day as needed for pain.  Go to the ER if you get worse, or for any other concerns.

## 2022-07-10 ENCOUNTER — Other Ambulatory Visit: Payer: Self-pay | Admitting: Cardiovascular Disease

## 2022-07-23 NOTE — Progress Notes (Unsigned)
Date:  07/24/2022   ID:  Cory Campbell, DOB April 08, 1980, MRN [id number]  Patient Location:  [address] [place] [address]   Provider location:   [facility], [place] office  PCP:  Emogene Morgan, MD  Cardiologist:  Fonnie Mu  Chief Complaint  Patient presents with   6 month follow up     "Doing well." Medications reviewed by the patient verbally.     History of Present Illness:    Cory Campbell is a 42 y.o. male has a past medical history of Arthrogryposis multiplex congenita,  chronic pain in his legs,  asthma,  hypertension,  diabetes,  severe obstructive sleep apnea on CPAP since April 2012,  obesity,  seizure disorder,  psoriasis with several  evaluations in the emergency room for chest pain on April 23, 06/01/2011.  frequent falls, walks with leg braces and canes atypical chest pain, often exacerbated by falls Ostomy He presents for routine followup of his hypertension and chest pain, controlled diabetes  Last seen by myself in clinic June 2023 Seen by one of our providers March 2024 Reports no longer working at [employer] secondary to renal failure Blood pressure stable  Blood pressure elevated on today's visit, also tachycardic rate 110 sinus tach Reports he took his metoprolol tartrate 75 mg this morning  Lab work reviewed A1C up, 8.2, off jardiance Wife reports he is drinking beverages he is not supposed to He reports that he drinks mainly diet sodas Total chol 96, LDL 22  Prior history of postural hypotension ED on 06/05/2021 with dizziness, presyncope x several hours. This was associated with a headache. In the ED, it was found that they had hypotension as low as 83/62  Followed by nephrology at [facility] Creatinine 1.13 BUN 19 down from creatinine 1.86 BUN 35 in February 2024  Married with 63-year-old boy No recent falls  EKG personally reviewed by myself on todays visit Sinus tachycardia rate 110 bpm  no significant ST-T wave changes  Of past medical history reviewed  emergency room May 05, 2018 Urinary tract infection Bilateral low back pain Treated with Pyridium, antibiotics, Diflucan  Fall April 13, 2018 shoulder injury, seen in the ER shoulder pain Shoulder is doing better  Seen in the ER January 17, 2018 diarrhea abdominal pain Symptoms better  ostomy repair later in March 2019 at [facility]   Past Medical History:  Diagnosis Date   (HFpEF) heart failure with preserved ejection fraction (HCC)    Acid reflux    Adopted    Anxiety    Arthritis    Arthrogryposis    Asthma    BPH (benign prostatic hyperplasia)    Bronchitis    Chronic pain syndrome    Depressive disorder, not elsewhere classified    Diabetes mellitus without complication (HCC)    Diverticulitis of colon (without mention of hemorrhage)(562.11)    ED (erectile dysfunction)    Herpes genitalis    HIV infection (HCC)    HTN (hypertension)    Hyperlipidemia    Hypertensive cardiomyopathy (HCC)    Hypogonadism in male    OAB (overactive bladder)    Other psoriasis    Seizure disorder (HCC)    Sleep apnea    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 or other cause, without mention of thyrotoxic crisis or storm    hyperthyroidism   Past Surgical History:  Procedure Laterality Date   APPENDECTOMY     COLOSTOMY  CORNEAL TRANSPLANT     DIALYSIS/PERMA CATHETER INSERTION Right 03/10/2022   Procedure: DIALYSIS/PERMA CATHETER INSERTION;  Surgeon: Renford Dills, MD;  Location: [facility] INVASIVE CV LAB;  Service: Cardiovascular;  Laterality: Right;   DIALYSIS/PERMA CATHETER REMOVAL N/A 04/18/2022   Procedure: DIALYSIS/PERMA CATHETER REMOVAL;  Surgeon: Renford Dills, MD;  Location: [facility] INVASIVE CV LAB;  Service: Cardiovascular;  Laterality: N/A;   feet surgery     both   HAND SURGERY     left and right   leg surgery     left and right   ORTHOPEDIC SURGERY     hands, feet, knees, legs   tubes in ears      both     Current Meds  Medication Sig   acyclovir (ZOVIRAX) 200 MG capsule Take 1 capsule (200 mg total) by mouth 2 (two) times daily.   albuterol (PROVENTIL HFA;VENTOLIN HFA) 108 (90 BASE) MCG/ACT inhaler Inhale 2 puffs into the lungs every 4 (four) hours as needed for wheezing or shortness of breath.    albuterol (PROVENTIL) (2.5 MG/3ML) 0.083% nebulizer solution Take 2.5 mg by nebulization every 4 (four) hours as needed for wheezing or shortness of breath.   amLODipine (NORVASC) 10 MG tablet Take 10 mg by mouth daily.   BIKTARVY 50-200-25 MG TABS tablet Take 1 tablet by mouth daily.   buprenorphine (BUTRANS) 7.5 MCG/HR Place 7.5 mg onto the skin once a week. Saturday   buPROPion (WELLBUTRIN XL) 150 MG 24 hr tablet Take 150 mg by mouth daily.   CABENUVA 600 & 900 MG/3ML injection INJECT A TOTAL OF 6 ML IN THE MUSCLE ONCE A MONTH FOR 2 MONTHS THEN USE EVERY 2 MONTHS THEREAFTER.   cetirizine (ZYRTEC) 10 MG tablet Take 10 mg by mouth daily.   citalopram (CELEXA) 20 MG tablet Take 20 mg by mouth daily.   cloNIDine (CATAPRES - DOSED IN MG/24 HR) 0.3 mg/24hr patch Place 0.3 mg onto the skin once a week.   clotrimazole (LOTRIMIN) 1 % cream Apply 1 Application topically 2 (two) times daily.   cyclobenzaprine (FLEXERIL) 5 MG tablet Take 5 mg by mouth 3 (three) times daily as needed.   diclofenac sodium (VOLTAREN) 1 % GEL Apply 2-4 g topically 4 (four) times daily as needed. For pain.   dicyclomine (BENTYL) 10 MG capsule Take 20 mg by mouth 2 (two) times daily before a meal.   Elastic Bandages & Supports (T.E.D. BELOW KNEE/MEDIUM) MISC 1 each by Does not apply route daily. Measure legs and give appropriate size.   famotidine (PEPCID) 20 MG tablet Take 20 mg by mouth daily.    feeding supplement, GLUCERNA SHAKE, (GLUCERNA SHAKE) LIQD Take 237 mLs by mouth 4 (four) times daily.   ferrous sulfate 325 (65 FE) MG tablet Take 325 mg by mouth daily with breakfast.   finasteride (PROSCAR) 5 MG tablet  Take 5 mg by mouth daily.   fluticasone (FLONASE) 50 MCG/ACT nasal spray Place 2 sprays into both nostrils daily.   fluticasone (FLOVENT HFA) 110 MCG/ACT inhaler Inhale 2 puffs into the lungs 2 (two) times daily.   gabapentin (NEURONTIN) 300 MG capsule Take 1 capsule (300 mg total) by mouth 2 (two) times daily. (Patient taking differently: Take 300 mg by mouth 3 (three) times daily.)   glucose 4 GM chewable tablet Chew 1 tablet by mouth as needed for low blood sugar.   hydrALAZINE (APRESOLINE) 50 MG tablet Take 1 tablet (50 mg total) by mouth 3 (three) times daily.  insulin degludec (TRESIBA) 100 UNIT/ML FlexTouch Pen Inject 100 Units into the skin 2 (two) times daily.   ipratropium (ATROVENT) 0.06 % nasal spray Place 2 sprays into both nostrils 4 (four) times daily.   isosorbide dinitrate (ISORDIL) 20 MG tablet Take 1 tablet (20 mg total) by mouth 3 (three) times daily.   ketoconazole (NIZORAL) 2 % shampoo Apply 1 application topically 2 (two) times a week. tues and thursday   Melatonin 5 MG TABS Take 10 mg by mouth at bedtime as needed (sleep).   metFORMIN (GLUCOPHAGE-XR) 500 MG 24 hr tablet Take 500 mg by mouth 2 (two) times daily with a meal.   Metoprolol Tartrate 75 MG TABS Take 1 tablet (75 mg total) by mouth 2 (two) times daily.   mirtazapine (REMERON) 45 MG tablet Take 45 mg by mouth at bedtime.   Multiple Vitamin (MULTIVITAMIN) tablet Take 1 tablet by mouth daily.   nitroGLYCERIN (NITROSTAT) 0.4 MG SL tablet PLACE 1 TABLET UNDER THE TONGUE EVERY 5 MINUTES AS NEEDED FOR CHEST PAIN. CALL 911 IF YOU HAVE TAKEN 3 TABS FOR CHEST PAIN   nortriptyline (PAMELOR) 25 MG capsule Take 50 mg by mouth at bedtime.   omega-3 acid ethyl esters (LOVAZA) 1 g capsule Take 1 g by mouth 3 (three) times daily.   omeprazole (PRILOSEC) 20 MG capsule Take 20 mg by mouth daily.   ondansetron (ZOFRAN-ODT) 4 MG disintegrating tablet Take 4 mg by mouth every 8 (eight) hours as needed for nausea or vomiting.    polyethylene glycol (MIRALAX / GLYCOLAX) 17 g packet Take 17 g by mouth 3 (three) times daily.   prednisoLONE acetate (PRED FORTE) 1 % ophthalmic suspension Place 1 drop into the right eye every 2 (two) hours while awake.   rosuvastatin (CRESTOR) 40 MG tablet TAKE 1 TABLET(40 MG) BY MOUTH DAILY   solifenacin (VESICARE) 10 MG tablet Take 10 mg by mouth daily.   Spacer/Aero-Holding Chambers (AEROCHAMBER PLUS) inhaler Use as instructed   TRULICITY 4.5 MG/0.5ML SOPN SMARTSIG:4.5 Milligram(s) SUB-Q Once a Week     Allergies:   Penicillins and Erythromycin base   Social History   Tobacco Use   Smoking status: Former    Types: Pipe, Software engineer, E-cigarettes    Quit date: 01/2018    Years since quitting: 4.5   Smokeless tobacco: Never   Tobacco comments:    Declines patch  Vaping Use   Vaping Use: Former   Substances: Nicotine, Flavoring  Substance Use Topics   Alcohol use: Not Currently    Alcohol/week: 3.0 standard drinks of alcohol    Types: 3 Cans of beer per week   Drug use: No     Family Hx: The patient's He was adopted. Family history is unknown by patient.  ROS:   Please see the history of present illness.    Review of Systems  Constitutional: Negative.   Respiratory: Negative.    Cardiovascular: Negative.   Gastrointestinal: Negative.   Musculoskeletal: Negative.        Falls, leg weakness  Neurological: Negative.   Psychiatric/Behavioral: Negative.    All other systems reviewed and are negative.    Labs/Other Tests and Data Reviewed:    Recent Labs: 11/24/2021: B Natriuretic Peptide 22.6 03/08/2022: ALT 21; Magnesium 2.2 03/15/2022: Hemoglobin 11.2; Platelets 404 04/07/2022: BUN 27; Creatinine, Ser 1.54; Potassium 5.1; Sodium 134   Recent Lipid Panel Lab Results  Component Value Date/Time   CHOL 163 10/07/2016 05:03 AM   TRIG 311 (H) 11/24/2021 07:17 AM  HDL 26 (L) 10/07/2016 05:03 AM   CHOLHDL 6.3 10/07/2016 05:03 AM   LDLCALC 112 (H) 10/07/2016 05:03 AM     Wt Readings from Last 3 Encounters:  07/24/22 154 lb (69.9 kg)  07/04/22 156 lb (70.8 kg)  05/02/22 170 lb (77.1 kg)     Exam:    Vital Signs: Vital signs may also be detailed in the HPI BP (!) 146/96 (BP Location: Left Arm, Patient Position: Sitting, Cuff Size: Normal)   Pulse (!) 110   Ht 5\' 6"  (1.676 m)   Wt 154 lb (69.9 kg)   SpO2 98%   BMI 24.86 kg/m   Constitutional:  oriented to person, place, and time. No distress.  Presenting in a wheelchair scooter HENT:  Head: Grossly normal Eyes:  no discharge. No scleral icterus.  Neck: No JVD, no carotid bruits  Cardiovascular: Tachycardic, regular, no murmurs appreciated Pulmonary/Chest: Clear to auscultation bilaterally, no wheezes or rails Abdominal: Soft.  no distension.  no tenderness.  Musculoskeletal: Normal range of motion Neurological:  normal muscle tone. Coordination normal. No atrophy Skin: Skin warm and dry Psychiatric: normal affect, pleasant   ASSESSMENT & PLAN:    Type 2 diabetes mellitus with complication, without long-term current use of insulin (HCC) A1c trending higher, diet discussed with him Reports he is followed by endocrine  Chest pain, unspecified type Previously healthy musculoskeletal chest wall pain Denies significant chest pain on today's visit  Frequent falls Denies any recent falls Long history of falls, injury Wheelchair-bound, muscle atrophy lower extremities  Essential hypertension Long history difficult to control hypertension Blood pressure elevated today and tachycardic Recommend he change his metoprolol to tartrate 75 twice daily over to metoprolol succinate 100 twice daily He reports compliance with his medications, wife helps  Obstructive sleep apnea on CPAP He reports compliance with his CPAP  Hyperlipidemia On Crestor, Cholesterol at goal  Acute renal failure Noted in February creatinine 1.8, appears to have resolved   Total encounter time more than 40  minutes  Greater than 50% was spent in counseling and coordination of care with the patient   Signed, Julien Nordmann, MD  07/24/2022 10:24 AM    [facility] [address], [place], [place] [address]

## 2022-07-24 ENCOUNTER — Encounter: Payer: Self-pay | Admitting: Cardiovascular Disease

## 2022-07-24 ENCOUNTER — Ambulatory Visit: Payer: Medicaid Other | Attending: Cardiovascular Disease | Admitting: Cardiovascular Disease

## 2022-07-24 VITALS — BP 146/96 | HR 110 | Ht 66.0 in | Wt 154.0 lb

## 2022-07-24 DIAGNOSIS — R0789 Other chest pain: Secondary | ICD-10-CM | POA: Insufficient documentation

## 2022-07-24 DIAGNOSIS — I5032 Chronic diastolic (congestive) heart failure: Secondary | ICD-10-CM | POA: Diagnosis present

## 2022-07-24 DIAGNOSIS — Z7984 Long term (current) use of oral hypoglycemic drugs: Secondary | ICD-10-CM

## 2022-07-24 DIAGNOSIS — R000 Tachycardia, unspecified: Secondary | ICD-10-CM | POA: Diagnosis present

## 2022-07-24 DIAGNOSIS — N186 End stage renal disease: Secondary | ICD-10-CM | POA: Insufficient documentation

## 2022-07-24 DIAGNOSIS — I10 Essential (primary) hypertension: Secondary | ICD-10-CM | POA: Insufficient documentation

## 2022-07-24 DIAGNOSIS — E782 Mixed hyperlipidemia: Secondary | ICD-10-CM | POA: Insufficient documentation

## 2022-07-24 DIAGNOSIS — E118 Type 2 diabetes mellitus with unspecified complications: Secondary | ICD-10-CM | POA: Insufficient documentation

## 2022-07-24 DIAGNOSIS — G4733 Obstructive sleep apnea (adult) (pediatric): Secondary | ICD-10-CM | POA: Insufficient documentation

## 2022-07-24 MED ORDER — METOPROLOL SUCCINATE ER 100 MG PO TB24: 100.0000 mg | ORAL_TABLET | Freq: Two times a day (BID) | ORAL | 3 refills | Status: DC

## 2022-07-24 NOTE — Patient Instructions (Addendum)
Medication Instructions:  Please change metoprolol tartrate to metoprolol succinate 100 mg twice a day   If you need a refill on your cardiac medications before your next appointment, please call your pharmacy.   Lab work: No new labs needed  Testing/Procedures: No new testing needed  Follow-Up: At [facility], you and your health needs are our priority.  As part of our continuing mission to provide you with exceptional heart care, we have created designated Provider Care Teams.  These Care Teams include your primary Cardiologist (physician) and Advanced Practice Providers (APPs -  Physician Assistants and Nurse Practitioners) who all work together to provide you with the care you need, when you need it.  You will need a follow up appointment in 6 months, APP ok  Providers on your designated Care Team:   Nicolasa Ducking, NP Eula Listen, PA-C Cadence Fransico Michael, [place]  COVID-19 Vaccine Information can be found at: [email or web address] For questions related to vaccine distribution or appointments, please email [email or web address] or call [phone].

## 2022-08-03 ENCOUNTER — Other Ambulatory Visit: Payer: Self-pay | Admitting: Cardiovascular Disease

## 2022-08-08 ENCOUNTER — Other Ambulatory Visit: Payer: Self-pay | Admitting: Cardiovascular Disease

## 2022-10-07 ENCOUNTER — Encounter: Payer: Self-pay | Admitting: Emergency Medicine

## 2022-10-07 ENCOUNTER — Ambulatory Visit
Admission: EM | Admit: 2022-10-07 | Discharge: 2022-10-07 | Disposition: A | Discharge status: Home / Self Care | Payer: Medicaid Other | Attending: Internal Medicine | Admitting: Internal Medicine

## 2022-10-07 DIAGNOSIS — N481 Balanitis: Secondary | ICD-10-CM

## 2022-10-07 MED ORDER — CLOTRIMAZOLE-BETAMETHASONE 1-0.05 % EX CREA: TOPICAL_CREAM | CUTANEOUS | 0 refills | Status: DC

## 2022-10-07 NOTE — ED Triage Notes (Signed)
Patient reports swelling and rash on his penis for the past 3 days.  Patient reports that the area is starting to smell.  Patient has been putting OTC hydrocortisone cream on the area.

## 2022-10-07 NOTE — Discharge Instructions (Addendum)
You were seen today for penile irritation and odor.  Your exam is consistent with a fungal infection around the foreskin.  I have sent in a cream for you to use 2 times daily.  Please keep this area clean and dry.  Please follow-up with your PCP if symptoms persist or worsen.

## 2022-10-07 NOTE — ED Provider Notes (Signed)
[facility]    CSN: [id number] Arrival date & time: 10/07/22  1517      History   Chief Complaint Chief Complaint  Patient presents with   Rash    HPI Cory Campbell is a 42 y.o. male presents to urgent care today with complaint of irritation, rash with a foul odor to his penis.  He noticed this 3 days ago.  He denies urgency, frequency, dysuria, burning with urination, penile discharge, testicular pain or swelling.  He denies fever, chills or bodyaches.  He reports he is uncircumcised.  He tries to keep the area clean and dry but it is difficult as he is disabled and in a wheelchair.  He has been using hydrocortisone cream OTC with minimal relief of symptoms.  HPI  Past Medical History:  Diagnosis Date   (HFpEF) heart failure with preserved ejection fraction (HCC)    Acid reflux    Adopted    Anxiety    Arthritis    Arthrogryposis    Asthma    BPH (benign prostatic hyperplasia)    Bronchitis    Chronic pain syndrome    Depressive disorder, not elsewhere classified    Diabetes mellitus without complication (HCC)    Diverticulitis of colon (without mention of hemorrhage)(562.11)    ED (erectile dysfunction)    Herpes genitalis    HIV infection (HCC)    HTN (hypertension)    Hyperlipidemia    Hypertensive cardiomyopathy (HCC)    Hypogonadism in male    OAB (overactive bladder)    Other psoriasis    Seizure disorder (HCC)    Sleep apnea    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 or other cause, without mention of thyrotoxic crisis or storm    hyperthyroidism    Patient Active Problem List   Diagnosis Date Noted   Renal insufficiency 04/13/2022   End stage renal disease (HCC) 04/11/2022   Hyperlipidemia 04/11/2022   Hyperkalemia 03/02/2022   Hyponatremia 03/02/2022   Metabolic acidosis 03/02/2022   UTI (urinary tract infection) 11/24/2021   Proctitis 11/24/2021   Diabetes mellitus without complication (HCC) 11/24/2021   Hypokalemia 11/24/2021    Pancreatitis 11/24/2021   Asthma 11/24/2021   Depression with anxiety 11/24/2021   Overweight (BMI 25.0-29.9) 11/24/2021   BPH (benign prostatic hyperplasia) 11/24/2021   Chronic diastolic CHF (congestive heart failure) (HCC) 11/24/2021   Elevated lactic acid level 11/24/2021   Pedal edema 09/12/2021   Bilateral foot pain 09/09/2021   Lactic acidosis 09/09/2021   Tobacco dependence 09/09/2021   Weakness    AKI (acute kidney injury) (HCC) 06/05/2021   Postural dizziness with presyncope 06/05/2021   Arthrogryposis, with contractures and functional paraplegia    Seizure disorder (HCC)    Headache    Acute asthma exacerbation 03/30/2020   Acute respiratory failure with hypoxia (HCC) 03/29/2020   Surgical wound, non healing    Acute colitis 05/20/2017   Colitis    Acute renal failure (HCC) 05/15/2017   Diarrhea 05/15/2017   Sepsis (HCC) 05/15/2017   Abdominal pain 11/25/2016   HIV (human immunodeficiency virus infection) (HCC) 11/19/2016   Hypotension 10/08/2016   Unstable angina (HCC) 10/06/2016   Shortness of breath 06/23/2015   Obesity 05/18/2014   Frequent falls 08/12/2013   Pain of left clavicle 08/12/2013   Tachycardia 01/21/2013   Chronic pain syndrome 08/12/2012   Sleep apnea 06/07/2012   Chest pain 06/20/2011   Hypertension associated with diabetes (HCC) 06/20/2011   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) 06/20/2011   Diabetes mellitus (HCC) 06/20/2011   Asthma, chronic, unspecified asthma severity, with acute exacerbation 01/18/2008   Hyperlipidemia associated with type 2 diabetes mellitus (HCC) 08/28/2006   Essential hypertension 06/27/2005    Past Surgical History:  Procedure Laterality Date   APPENDECTOMY     COLOSTOMY     CORNEAL TRANSPLANT     DIALYSIS/PERMA CATHETER INSERTION Right 03/10/2022   Procedure: DIALYSIS/PERMA CATHETER INSERTION;  Surgeon: Renford Dills, MD;  Location: [facility];  Service: Cardiovascular;  Laterality:  Right;   DIALYSIS/PERMA CATHETER REMOVAL N/A 04/18/2022   Procedure: DIALYSIS/PERMA CATHETER REMOVAL;  Surgeon: Renford Dills, MD;  Location: [facility];  Service: Cardiovascular;  Laterality: N/A;   feet surgery     both   HAND SURGERY     left and right   leg surgery     left and right   ORTHOPEDIC SURGERY     hands, feet, knees, legs   tubes in ears     both       Home Medications    Prior to Admission medications   Medication Sig Start Date End Date Taking? Authorizing Provider  clotrimazole-betamethasone (LOTRISONE) cream Apply to affected area 2 times daily prn 10/07/22  Yes Weaver Tweed, Salvadore Oxford, NP  acyclovir (ZOVIRAX) 200 MG capsule Take 1 capsule (200 mg total) by mouth 2 (two) times daily. 03/16/22   Willeen Niece, MD  albuterol (PROVENTIL HFA;VENTOLIN HFA) 108 (90 BASE) MCG/ACT inhaler Inhale 2 puffs into the lungs every 4 (four) hours as needed for wheezing or shortness of breath.     [provider]  albuterol (PROVENTIL) (2.5 MG/3ML) 0.083% nebulizer solution Take 2.5 mg by nebulization every 4 (four) hours as needed for wheezing or shortness of breath.    [provider]  amLODipine (NORVASC) 10 MG tablet Take 10 mg by mouth daily.    [provider]  BIKTARVY 50-200-25 MG TABS tablet Take 1 tablet by mouth daily. 11/21/16   [provider]  buprenorphine (BUTRANS) 7.5 MCG/HR Place 7.5 mg onto the skin once a week. Saturday    [provider]  buPROPion (WELLBUTRIN XL) 150 MG 24 hr tablet Take 150 mg by mouth daily. 08/11/21   [provider]  CABENUVA 600 & 900 MG/3ML injection INJECT A TOTAL OF 6 ML IN THE MUSCLE ONCE A MONTH FOR 2 MONTHS THEN USE EVERY 2 MONTHS THEREAFTER. 06/07/22   [provider]  cetirizine (ZYRTEC) 10 MG tablet Take 10 mg by mouth daily.    [provider]  citalopram (CELEXA) 20 MG tablet Take 20 mg by mouth daily.    [provider]  cloNIDine (CATAPRES -  DOSED IN MG/24 HR) 0.3 mg/24hr patch Place 0.3 mg onto the skin once a week. 05/04/22 05/04/23  [provider]  cyclobenzaprine (FLEXERIL) 5 MG tablet Take 5 mg by mouth 3 (three) times daily as needed. 07/11/22   [provider]  diclofenac sodium (VOLTAREN) 1 % GEL Apply 2-4 g topically 4 (four) times daily as needed. For pain.    [provider]  dicyclomine (BENTYL) 10 MG capsule Take 20 mg by mouth 2 (two) times daily before a meal. 05/01/21   [provider]  Elastic Bandages & Supports (T.E.D. BELOW KNEE/MEDIUM) MISC 1 each by Does not apply route daily. Measure legs and give appropriate size. 09/12/21   Calvert Cantor, MD  famotidine (PEPCID) 20 MG tablet Take 20 mg by mouth daily.  01/03/16   [provider]  feeding supplement, GLUCERNA SHAKE, (GLUCERNA SHAKE) LIQD Take 237 mLs by mouth 4 (four) times daily.    [provider]  ferrous sulfate 325 (65 FE) MG tablet Take 325 mg by mouth daily with breakfast.    [provider]  finasteride (PROSCAR) 5 MG tablet Take 5 mg by mouth daily.    [provider]  fluticasone (FLONASE) 50 MCG/ACT nasal spray Place 2 sprays into both nostrils daily. 07/04/22   Domenick Gong, MD  fluticasone (FLOVENT HFA) 110 MCG/ACT inhaler Inhale 2 puffs into the lungs 2 (two) times daily.    [provider]  gabapentin (NEURONTIN) 300 MG capsule Take 1 capsule (300 mg total) by mouth 2 (two) times daily. Patient taking differently: Take 300 mg by mouth 3 (three) times daily. 09/12/21 07/24/22  Calvert Cantor, MD  glucose 4 GM chewable tablet Chew 1 tablet by mouth as needed for low blood sugar.    [provider]  hydrALAZINE (APRESOLINE) 50 MG tablet Take 1 tablet (50 mg total) by mouth 3 (three) times daily. 03/16/22   Willeen Niece, MD  insulin degludec (TRESIBA) 100 UNIT/ML FlexTouch Pen Inject 100 Units into the skin 2 (two) times daily.    [provider]  ipratropium  (ATROVENT) 0.06 % nasal spray Place 2 sprays into both nostrils 4 (four) times daily. 08/29/21   Becky Augusta, NP  isosorbide dinitrate (ISORDIL) 20 MG tablet Take 1 tablet (20 mg total) by mouth 3 (three) times daily. 03/16/22   Willeen Niece, MD  Melatonin 5 MG TABS Take 10 mg by mouth at bedtime as needed (sleep).    [provider]  metFORMIN (GLUCOPHAGE-XR) 500 MG 24 hr tablet Take 500 mg by mouth 2 (two) times daily with a meal. 06/23/22 06/23/23  [provider]  metoprolol succinate (TOPROL-XL) 100 MG 24 hr tablet Take 1 tablet (100 mg total) by mouth 2 (two) times daily. Take with or immediately following a meal. 07/24/22 10/22/22  Antonieta Iba, MD  mirtazapine (REMERON) 45 MG tablet Take 45 mg by mouth at bedtime.    [provider]  Multiple Vitamin (MULTIVITAMIN) tablet Take 1 tablet by mouth daily.    [provider]  nitroGLYCERIN (NITROSTAT) 0.4 MG SL tablet PLACE 1 TABLET UNDER THE TONGUE EVERY 5 MINUTES AS NEEDED FOR CHEST PAIN. CALL 911 IF YOU HAVE TAKEN 3 TABLETS FOR CHEST PAIN 08/03/22   Antonieta Iba, MD  nortriptyline (PAMELOR) 25 MG capsule Take 50 mg by mouth at bedtime.    [provider]  omega-3 acid ethyl esters (LOVAZA) 1 g capsule Take 1 g by mouth 3 (three) times daily.    [provider]  omeprazole (PRILOSEC) 20 MG capsule Take 20 mg by mouth daily. 09/08/21   [provider]  ondansetron (ZOFRAN-ODT) 4 MG disintegrating tablet Take 4 mg by mouth every 8 (eight) hours as needed for nausea or vomiting.    [provider]  polyethylene glycol (MIRALAX / GLYCOLAX) 17 g packet Take 17 g by mouth 3 (three) times daily.    [provider]  prednisoLONE acetate (PRED FORTE) 1 % ophthalmic suspension Place 1 drop into the right eye every 2 (two) hours while awake. 06/21/22   [provider]  rosuvastatin (CRESTOR) 40 MG tablet TAKE 1 TABLET(40 MG) BY MOUTH DAILY 04/30/19   Antonieta Iba, MD  solifenacin (VESICARE) 10 MG tablet Take 10 mg by mouth daily.  [provider]  Spacer/Aero-Holding Chambers (AEROCHAMBER PLUS) inhaler Use as instructed 03/23/18   Domenick Gong, MD  TRULICITY 4.5 MG/0.5ML SOPN SMARTSIG:4.5 Milligram(s) SUB-Q Once a Week 09/23/21   [provider]    Family History Family History  Adopted: Yes  Family history unknown: Yes    Social History Social History   Tobacco Use   Smoking status: Former    Types: Pipe, Software engineer, E-cigarettes    Quit date: 01/2018    Years since quitting: 4.7   Smokeless tobacco: Never   Tobacco comments:    Declines patch  Vaping Use   Vaping status: Former   Substances: Nicotine, Flavoring  Substance Use Topics   Alcohol use: Not Currently    Alcohol/week: 3.0 standard drinks of alcohol    Types: 3 Cans of beer per week   Drug use: No     Allergies   Penicillins and Erythromycin base   Review of Systems Review of Systems  Constitutional:  Negative for chills and fever.  Respiratory:  Negative for cough and shortness of breath.   Cardiovascular:  Negative for chest pain.  Gastrointestinal:  Negative for abdominal pain.  Genitourinary:  Negative for difficulty urinating, dysuria, frequency, genital sores, hematuria, penile discharge, penile pain, penile swelling, scrotal swelling, testicular pain and urgency.       Patient reports foul odor around foreskin  Musculoskeletal:  Negative for arthralgias and myalgias.  Skin:  Positive for rash.     Physical Exam Triage Vital Signs ED Triage Vitals  Encounter Vitals Group     BP 10/07/22 1523 (!) 145/106     Systolic BP Percentile --      Diastolic BP Percentile --      Pulse Rate 10/07/22 1523 91     Resp 10/07/22 1523 15     Temp 10/07/22 1523 98.8 F (37.1 C)     Temp Source 10/07/22 1523 Oral     SpO2 10/07/22 1523 97 %     Weight 10/07/22 1522 154 lb 1.6 oz (69.9 kg)     Height --      Head Circumference --      Peak  Flow --      Pain Score 10/07/22 1522 0     Pain Loc --      Pain Education --      Exclude from Growth Chart --    No data found.  Updated Vital Signs BP (!) 145/106 (BP Location: Left Arm)   Pulse 91   Temp 98.8 F (37.1 C) (Oral)   Resp 15   Wt 154 lb 1.6 oz (69.9 kg)   SpO2 97%   BMI 24.87 kg/m   Visual Acuity Right Eye Distance:   Left Eye Distance:   Bilateral Distance:    Right Eye Near:   Left Eye Near:    Bilateral Near:     Physical Exam Constitutional:      General: He is not in acute distress. Cardiovascular:     Rate and Rhythm: Normal rate and regular rhythm.     Heart sounds: Normal heart sounds.  Pulmonary:     Effort: Pulmonary effort is normal.     Breath sounds: Normal breath sounds.  Abdominal:     Palpations: Abdomen is soft.     Tenderness: There is no abdominal tenderness.  Genitourinary:    Penis: Normal.      Comments: Slight irritation at the base of the glans penis once the foreskin was pulled  back consistent with fungal infection Neurological:     Mental Status: He is alert.      UC Treatments / Results  Labs   EKG   Radiology   Procedures   Medications Ordered in UC Medications - No data to display  Initial Impression / Assessment and Plan / UC Course  I have reviewed the triage vital signs and the nursing notes.  Pertinent labs & imaging results that were available during my care of the patient were reviewed by me and considered in my medical decision making (see chart for details).     42 year old male with 3-day history of penile irritation with foul odor.  Exam consistent with balanitis.  Encouraged him to keep this area clean and dry.  Avoid excessively rubbing the area.  Rx for Lotrisone cream twice daily as needed.  Follow-up with your PCP if symptoms persist or worsen.  Final Clinical Impressions(s) / UC Diagnoses   Final diagnoses:  Balanitis     Discharge Instructions      You were seen today  for penile irritation and odor.  Your exam is consistent with a fungal infection around the foreskin.  I have sent in a cream for you to use 2 times daily.  Please keep this area clean and dry.  Please follow-up with your PCP if symptoms persist or worsen.     ED Prescriptions     Medication Sig Dispense Auth. Provider   clotrimazole-betamethasone (LOTRISONE) cream Apply to affected area 2 times daily prn 15 g Lorre Munroe, NP      PDMP not reviewed this encounter.   Lorre Munroe, NP 10/07/22 [phone]

## 2022-10-16 ENCOUNTER — Telehealth: Payer: Self-pay | Admitting: Cardiovascular Disease

## 2022-10-16 NOTE — Telephone Encounter (Signed)
Pt is calling to make sure he is taking the right metoprolol that Dr. Mariah Milling prescribed him back in June.  Pt states he's got a script for both lopressor and succinate.  Endorsed to the pt that according to Dr. Windell Hummingbird last assessment and plan at last OV in June, he stopped his metoprolol tartrate and switched him to metoprolol succinate 100 mg po bid.   Reiterated and as copied/highlighted in red below from last OV, he is to STOP Metoprolol tartrate and START taking metoprolol succinate 100 mg po bid.  Advised the pt that he should take the Toprol XL 100 mg bid and take with meals.   Pt verbalized understanding and agrees with this plan.  Pt states he is doing this correctly.       ASSESSMENT & PLAN:     Type 2 diabetes mellitus with complication, without long-term current use of insulin (HCC) A1c trending higher, diet discussed with him Reports he is followed by endocrine   Chest pain, unspecified type Previously healthy musculoskeletal chest wall pain Denies significant chest pain on today's visit   Frequent falls Denies any recent falls Long history of falls, injury Wheelchair-bound, muscle atrophy lower extremities   Essential hypertension Long history difficult to control hypertension Blood pressure elevated today and tachycardic Recommend he change his metoprolol to tartrate 75 twice daily over to metoprolol succinate 100 twice daily He reports compliance with his medications, wife helps

## 2022-10-16 NOTE — Telephone Encounter (Signed)
Pt c/o medication issue:  1. Name of Medication:   metoprolol succinate (TOPROL-XL) 100 MG 24 hr tablet    2. How are you currently taking this medication (dosage and times per day)? Not sure   3. Are you having a reaction (difficulty breathing--STAT)? No   4. What is your medication issue? Pt needs to clarify how he should be taking this medication.

## 2022-10-21 ENCOUNTER — Inpatient Hospital Stay
Admission: EM | Admit: 2022-10-21 | Discharge: 2022-10-22 | DRG: 690 | Disposition: A | Discharge status: Home / Self Care | Payer: Medicaid Other | Attending: Student in an Organized Health Care Education/Training Program | Admitting: Student in an Organized Health Care Education/Training Program

## 2022-10-21 DIAGNOSIS — Z947 Corneal transplant status: Secondary | ICD-10-CM

## 2022-10-21 DIAGNOSIS — Q688 Other specified congenital musculoskeletal deformities: Secondary | ICD-10-CM

## 2022-10-21 DIAGNOSIS — Z7984 Long term (current) use of oral hypoglycemic drugs: Secondary | ICD-10-CM | POA: Diagnosis not present

## 2022-10-21 DIAGNOSIS — Z7985 Long-term (current) use of injectable non-insulin antidiabetic drugs: Secondary | ICD-10-CM | POA: Diagnosis not present

## 2022-10-21 DIAGNOSIS — Z21 Asymptomatic human immunodeficiency virus [HIV] infection status: Secondary | ICD-10-CM | POA: Diagnosis present

## 2022-10-21 DIAGNOSIS — E876 Hypokalemia: Secondary | ICD-10-CM | POA: Diagnosis present

## 2022-10-21 DIAGNOSIS — F419 Anxiety disorder, unspecified: Secondary | ICD-10-CM | POA: Diagnosis present

## 2022-10-21 DIAGNOSIS — F444 Conversion disorder with motor symptom or deficit: Secondary | ICD-10-CM | POA: Diagnosis present

## 2022-10-21 DIAGNOSIS — N179 Acute kidney failure, unspecified: Secondary | ICD-10-CM | POA: Diagnosis present

## 2022-10-21 DIAGNOSIS — Z79899 Other long term (current) drug therapy: Secondary | ICD-10-CM

## 2022-10-21 DIAGNOSIS — R109 Unspecified abdominal pain: Secondary | ICD-10-CM | POA: Diagnosis present

## 2022-10-21 DIAGNOSIS — E785 Hyperlipidemia, unspecified: Secondary | ICD-10-CM | POA: Diagnosis present

## 2022-10-21 DIAGNOSIS — Z87440 Personal history of urinary (tract) infections: Secondary | ICD-10-CM

## 2022-10-21 DIAGNOSIS — G40909 Epilepsy, unspecified, not intractable, without status epilepticus: Secondary | ICD-10-CM

## 2022-10-21 DIAGNOSIS — I5032 Chronic diastolic (congestive) heart failure: Secondary | ICD-10-CM | POA: Diagnosis present

## 2022-10-21 DIAGNOSIS — B20 Human immunodeficiency virus [HIV] disease: Secondary | ICD-10-CM | POA: Diagnosis present

## 2022-10-21 DIAGNOSIS — Z794 Long term (current) use of insulin: Secondary | ICD-10-CM

## 2022-10-21 DIAGNOSIS — N3000 Acute cystitis without hematuria: Principal | ICD-10-CM | POA: Diagnosis present

## 2022-10-21 DIAGNOSIS — K219 Gastro-esophageal reflux disease without esophagitis: Secondary | ICD-10-CM | POA: Diagnosis present

## 2022-10-21 DIAGNOSIS — Z880 Allergy status to penicillin: Secondary | ICD-10-CM

## 2022-10-21 DIAGNOSIS — Z933 Colostomy status: Secondary | ICD-10-CM

## 2022-10-21 DIAGNOSIS — F32A Depression, unspecified: Secondary | ICD-10-CM | POA: Diagnosis present

## 2022-10-21 DIAGNOSIS — F17200 Nicotine dependence, unspecified, uncomplicated: Secondary | ICD-10-CM | POA: Diagnosis present

## 2022-10-21 DIAGNOSIS — E119 Type 2 diabetes mellitus without complications: Secondary | ICD-10-CM

## 2022-10-21 DIAGNOSIS — N189 Chronic kidney disease, unspecified: Secondary | ICD-10-CM | POA: Diagnosis present

## 2022-10-21 DIAGNOSIS — N400 Enlarged prostate without lower urinary tract symptoms: Secondary | ICD-10-CM | POA: Diagnosis present

## 2022-10-21 DIAGNOSIS — J45909 Unspecified asthma, uncomplicated: Secondary | ICD-10-CM | POA: Diagnosis present

## 2022-10-21 DIAGNOSIS — G894 Chronic pain syndrome: Secondary | ICD-10-CM | POA: Diagnosis present

## 2022-10-21 DIAGNOSIS — E1122 Type 2 diabetes mellitus with diabetic chronic kidney disease: Secondary | ICD-10-CM | POA: Diagnosis present

## 2022-10-21 DIAGNOSIS — I130 Hypertensive heart and chronic kidney disease with heart failure and stage 1 through stage 4 chronic kidney disease, or unspecified chronic kidney disease: Secondary | ICD-10-CM | POA: Diagnosis present

## 2022-10-21 DIAGNOSIS — Z7951 Long term (current) use of inhaled steroids: Secondary | ICD-10-CM

## 2022-10-21 DIAGNOSIS — N390 Urinary tract infection, site not specified: Secondary | ICD-10-CM | POA: Diagnosis present

## 2022-10-21 DIAGNOSIS — I10 Essential (primary) hypertension: Secondary | ICD-10-CM | POA: Diagnosis present

## 2022-10-21 DIAGNOSIS — R1030 Lower abdominal pain, unspecified: Secondary | ICD-10-CM

## 2022-10-21 DIAGNOSIS — R636 Underweight: Secondary | ICD-10-CM | POA: Diagnosis present

## 2022-10-21 LAB — URINALYSIS, ROUTINE W REFLEX MICROSCOPIC
Bilirubin Urine: NEGATIVE
Glucose, UA: NEGATIVE mg/dL
Ketones, ur: NEGATIVE mg/dL
Nitrite: NEGATIVE
Protein, ur: 100 mg/dL — AB
Specific Gravity, Urine: 1.015 (ref 1.005–1.030)
WBC, UA: >50 WBC/hpf (ref 0–5)
pH: 6 (ref 5.0–8.0)

## 2022-10-21 LAB — COMPREHENSIVE METABOLIC PANEL
ALT: 18 U/L (ref 0–44)
AST: 17 U/L (ref 15–41)
Albumin: 3.6 g/dL (ref 3.5–5.0)
Alkaline Phosphatase: 58 U/L (ref 38–126)
Anion gap: 15 (ref 5–15)
BUN: 16 mg/dL (ref 6–20)
CO2: 28 mmol/L (ref 22–32)
Calcium: 9.4 mg/dL (ref 8.9–10.3)
Chloride: 92 mmol/L — ABNORMAL LOW (ref 98–111)
Creatinine, Ser: 1.35 mg/dL — ABNORMAL HIGH (ref 0.61–1.24)
GFR, Estimated: >60 mL/min (ref >60)
Glucose, Bld: 137 mg/dL — ABNORMAL HIGH (ref 70–99)
Potassium: 2.4 mmol/L — CL (ref 3.5–5.1)
Sodium: 135 mmol/L (ref 135–145)
Total Bilirubin: 0.5 mg/dL (ref 0.3–1.2)
Total Protein: 7.5 g/dL (ref 6.5–8.1)

## 2022-10-21 LAB — CHLAMYDIA/NGC RT PCR (ARMC ONLY)
Chlamydia Tr: NOT DETECTED
N gonorrhoeae: NOT DETECTED

## 2022-10-21 LAB — CBC
HCT: 42.8 % (ref 39.0–52.0)
Hemoglobin: 13.7 g/dL (ref 13.0–17.0)
MCH: 27.5 pg (ref 26.0–34.0)
MCHC: 32 g/dL (ref 30.0–36.0)
MCV: 85.9 fL (ref 80.0–100.0)
Platelets: 277 10*3/uL (ref 150–400)
RBC: 4.98 MIL/uL (ref 4.22–5.81)
RDW: 14.4 % (ref 11.5–15.5)
WBC: 8.3 10*3/uL (ref 4.0–10.5)
nRBC: 0 % (ref 0.0–0.2)

## 2022-10-21 LAB — LIPASE, BLOOD: Lipase: 35 U/L (ref 11–51)

## 2022-10-21 LAB — MAGNESIUM: Magnesium: 2.1 mg/dL (ref 1.7–2.4)

## 2022-10-21 MED ORDER — HYDRALAZINE HCL 50 MG PO TABS
50.0000 mg | ORAL_TABLET | Freq: Three times a day (TID) | ORAL | Status: DC
  Administered 2022-10-22: 50 mg via ORAL
  Filled 2022-10-21: qty 1

## 2022-10-21 MED ORDER — ONDANSETRON 4 MG PO TBDP: 4.0000 mg | ORAL_TABLET | Freq: Three times a day (TID) | ORAL | Status: DC | PRN

## 2022-10-21 MED ORDER — ALBUTEROL SULFATE (2.5 MG/3ML) 0.083% IN NEBU: 2.5000 mg | INHALATION_SOLUTION | RESPIRATORY_TRACT | Status: DC | PRN

## 2022-10-21 MED ORDER — MORPHINE SULFATE (PF) 2 MG/ML IV SOLN
2.0000 mg | INTRAVENOUS | Status: DC | PRN
  Filled 2022-10-21: qty 1

## 2022-10-21 MED ORDER — PREDNISOLONE ACETATE 1 % OP SUSP
1.0000 [drp] | OPHTHALMIC | Status: DC
  Filled 2022-10-21: qty 1

## 2022-10-21 MED ORDER — SODIUM CHLORIDE 0.9 % IV SOLN
3.0000 g | Freq: Once | INTRAVENOUS | Status: AC
  Administered 2022-10-21: 3 g via INTRAVENOUS
  Filled 2022-10-21: qty 8

## 2022-10-21 MED ORDER — ISOSORBIDE DINITRATE 20 MG PO TABS
20.0000 mg | ORAL_TABLET | Freq: Three times a day (TID) | ORAL | Status: DC
  Administered 2022-10-22: 20 mg via ORAL
  Filled 2022-10-21 (×3): qty 1

## 2022-10-21 MED ORDER — FLUTICASONE PROPIONATE 50 MCG/ACT NA SUSP: 2.0000 | Freq: Every day | NASAL | Status: DC | PRN

## 2022-10-21 MED ORDER — POTASSIUM CHLORIDE 10 MEQ/100ML IV SOLN
10.0000 meq | INTRAVENOUS | Status: AC
  Administered 2022-10-21 – 2022-10-22 (×2): 10 meq via INTRAVENOUS
  Filled 2022-10-21 (×2): qty 100

## 2022-10-21 MED ORDER — NORTRIPTYLINE HCL 25 MG PO CAPS
50.0000 mg | ORAL_CAPSULE | Freq: Every day | ORAL | Status: DC
  Administered 2022-10-21: 50 mg via ORAL
  Filled 2022-10-21: qty 2

## 2022-10-21 MED ORDER — CITALOPRAM HYDROBROMIDE 20 MG PO TABS
20.0000 mg | ORAL_TABLET | Freq: Every day | ORAL | Status: DC
  Administered 2022-10-22: 20 mg via ORAL
  Filled 2022-10-21: qty 1

## 2022-10-21 MED ORDER — INSULIN ASPART 100 UNIT/ML IJ SOLN
0.0000 [IU] | INTRAMUSCULAR | Status: DC | PRN
  Administered 2022-10-22: 1 [IU] via SUBCUTANEOUS
  Filled 2022-10-21: qty 1

## 2022-10-21 MED ORDER — METOPROLOL SUCCINATE ER 50 MG PO TB24
50.0000 mg | ORAL_TABLET | Freq: Every day | ORAL | Status: DC
  Administered 2022-10-22: 50 mg via ORAL
  Filled 2022-10-21: qty 1

## 2022-10-21 MED ORDER — BICTEGRAVIR-EMTRICITAB-TENOFOV 50-200-25 MG PO TABS
1.0000 | ORAL_TABLET | Freq: Every day | ORAL | Status: DC
  Administered 2022-10-22: 1 via ORAL
  Filled 2022-10-21: qty 1

## 2022-10-21 MED ORDER — GABAPENTIN 300 MG PO CAPS
300.0000 mg | ORAL_CAPSULE | Freq: Three times a day (TID) | ORAL | Status: DC
  Administered 2022-10-21 – 2022-10-22 (×2): 300 mg via ORAL
  Filled 2022-10-21 (×2): qty 1

## 2022-10-21 MED ORDER — BUPROPION HCL ER (XL) 150 MG PO TB24
150.0000 mg | ORAL_TABLET | Freq: Every day | ORAL | Status: DC
  Administered 2022-10-22: 150 mg via ORAL
  Filled 2022-10-21: qty 1

## 2022-10-21 MED ORDER — POTASSIUM CHLORIDE 10 MEQ/100ML IV SOLN
10.0000 meq | INTRAVENOUS | Status: AC
  Administered 2022-10-21 (×2): 10 meq via INTRAVENOUS
  Filled 2022-10-21 (×2): qty 100

## 2022-10-21 MED ORDER — CLONIDINE HCL 0.3 MG/24HR TD PTWK
0.3000 mg | MEDICATED_PATCH | TRANSDERMAL | Status: DC
  Filled 2022-10-21: qty 1

## 2022-10-21 MED ORDER — MIRTAZAPINE 15 MG PO TABS
45.0000 mg | ORAL_TABLET | Freq: Every day | ORAL | Status: DC
  Administered 2022-10-21: 45 mg via ORAL
  Filled 2022-10-21: qty 3

## 2022-10-21 MED ORDER — ACYCLOVIR 200 MG PO CAPS
200.0000 mg | ORAL_CAPSULE | Freq: Two times a day (BID) | ORAL | Status: DC
  Administered 2022-10-22: 200 mg via ORAL
  Filled 2022-10-21 (×2): qty 1

## 2022-10-21 MED ORDER — ACETAMINOPHEN 325 MG PO TABS: 650.0000 mg | ORAL_TABLET | Freq: Four times a day (QID) | ORAL | Status: DC | PRN

## 2022-10-21 MED ORDER — SODIUM CHLORIDE 0.9 % IV SOLN: INTRAVENOUS | Status: DC

## 2022-10-21 MED ORDER — ROSUVASTATIN CALCIUM 20 MG PO TABS
40.0000 mg | ORAL_TABLET | Freq: Every day | ORAL | Status: DC
  Administered 2022-10-22: 40 mg via ORAL
  Filled 2022-10-21: qty 4
  Filled 2022-10-21: qty 2

## 2022-10-21 MED ORDER — POTASSIUM CHLORIDE CRYS ER 20 MEQ PO TBCR
40.0000 meq | EXTENDED_RELEASE_TABLET | Freq: Once | ORAL | Status: AC
  Administered 2022-10-21: 40 meq via ORAL
  Filled 2022-10-21: qty 2

## 2022-10-21 MED ORDER — HEPARIN SODIUM (PORCINE) 5000 UNIT/ML IJ SOLN
5000.0000 [IU] | Freq: Three times a day (TID) | INTRAMUSCULAR | Status: DC
  Administered 2022-10-21 – 2022-10-22 (×3): 5000 [IU] via SUBCUTANEOUS
  Filled 2022-10-21 (×3): qty 1

## 2022-10-21 MED ORDER — HYDROCODONE-ACETAMINOPHEN 5-325 MG PO TABS: 1.0000 | ORAL_TABLET | ORAL | Status: DC | PRN

## 2022-10-21 MED ORDER — NITROGLYCERIN 0.4 MG SL SUBL: 0.4000 mg | SUBLINGUAL_TABLET | SUBLINGUAL | Status: DC | PRN

## 2022-10-21 MED ORDER — ACETAMINOPHEN 650 MG RE SUPP: 650.0000 mg | Freq: Four times a day (QID) | RECTAL | Status: DC | PRN

## 2022-10-21 MED ORDER — SODIUM CHLORIDE 0.9% FLUSH
3.0000 mL | Freq: Two times a day (BID) | INTRAVENOUS | Status: DC
  Administered 2022-10-22 (×2): 3 mL via INTRAVENOUS

## 2022-10-21 NOTE — Assessment & Plan Note (Signed)
Stable euvolemic.  Cont Crestor, metoprolol with reduced dose at 50 mg, Imdur continued hydralazine continued at current dose.

## 2022-10-21 NOTE — Assessment & Plan Note (Signed)
  Vitals:   10/21/22 1758 10/21/22 2130 10/21/22 2200 10/21/22 2230  BP: 114/81 111/83 100/74 101/68  BP med doses lowered as follows due to low normal blood pressures. Toprol xl 50 daily.  Imdur continued at current dose of 20 mg. Patient also continued on same dose hydralazine at 50 3 times daily. Amlodipine and clonidine is held.

## 2022-10-21 NOTE — Assessment & Plan Note (Signed)
Cont on [employer]

## 2022-10-21 NOTE — Assessment & Plan Note (Signed)
No AED . Seizures precaution.

## 2022-10-21 NOTE — Assessment & Plan Note (Signed)
Hold metformin due to elevated creatinine. Hold tresiba and trulicity  Glycemic protocol.

## 2022-10-21 NOTE — ED Triage Notes (Signed)
Pt arrives via [facility] from home originally for pt potentially being altered. On arrival pt was at baseline, but c/o lower abd pain and penile pain, hx of frequent UTI. Pt denies N/V/D. Recently seen at [facility] for similar issue. Pt A+Ox4 on arrival.

## 2022-10-21 NOTE — Assessment & Plan Note (Signed)
-  Nicotine patch 

## 2022-10-21 NOTE — Assessment & Plan Note (Signed)
  Replaced in ed and pharmacy consult.

## 2022-10-21 NOTE — ED Notes (Signed)
K+ 2.4 MD Derrill Kay informed

## 2022-10-21 NOTE — ED Notes (Signed)
Request made for transport to the floor ?

## 2022-10-21 NOTE — Assessment & Plan Note (Signed)
Pt has h/o rec UTI and ESBL. Cont pt on unasyn as pt has h/o ESBL.

## 2022-10-21 NOTE — Assessment & Plan Note (Signed)
Lab Results  Component Value Date   CREATININE 1.35 (H) 10/21/2022   CREATININE 1.54 (H) 04/07/2022   CREATININE 2.57 (H) 03/16/2022  Improving renal function.  Avoid contrast. Renally dose meds.  Hold metformin.

## 2022-10-21 NOTE — ED Provider Notes (Addendum)
[facility] Provider Note    Event Date/Time   First MD Initiated Contact with Patient 10/21/22 1920     (approximate)   History   Abdominal Pain   HPI  Cory Campbell is a 42 year old male with history of CKD, depression, HIV presenting to the emergency department for evaluation of genital discomfort.  Patient reports that he recently has noted some pain in his genital area.  Specifically reports pain at the tip of his penis with burning when he pees.  Has history of UTIs previously and says this feels similar.  Has noted a area of swelling in his left inguinal area.  He additionally reports that he had been feeling confused, but denies this currently. He initially reported abdominal pain, but points to his genital area when asked where his pain is. Denies headache, cough, congestion, nausea, vomiting, diarrhea.  Reports having regular bowel movements.   Seen in urgent care on 8/31 for penile irritation thought to be related to balanitis and was treated with a clotrimazole-betamethasone.      Physical Exam   Triage Vital Signs: ED Triage Vitals  Encounter Vitals Group     BP 10/21/22 1758 114/81     Systolic BP Percentile --      Diastolic BP Percentile --      Pulse Rate 10/21/22 1758 (!) 101     Resp 10/21/22 1758 16     Temp 10/21/22 1758 98.7 F (37.1 C)     Temp Source 10/21/22 1758 Oral     SpO2 10/21/22 1758 98 %     Weight --      Height --      Head Circumference --      Peak Flow --      Pain Score 10/21/22 1759 0     Pain Loc --      Pain Education --      Exclude from Growth Chart --     Most recent vital signs: Vitals:   10/21/22 1758  BP: 114/81  Pulse: (!) 101  Resp: 16  Temp: 98.7 F (37.1 C)  SpO2: 98%     General: Awake, interactive  CV:  Mild tachycardia with regular rhythm, normal peripheral perfusion Resp:  Lungs clear, unlabored respirations.  Abd:  Soft, nondistended, not simply tender to  palpation GU:  Testicles nontender to palpation and normal lie.  Uncircumcised penis.  There is tenderness palpation over the tip of the penis without appreciable lesion or discharge.  There is a indurated area in the left inguinal area with some tenderness to palpation. Neuro:  Alert and oriented, symmetric facial movement, 5 5 strength in the bilateral upper extremities.  Weakness of the lower extremities greater on the left that patient reports this is baseline   ED Results / Procedures / Treatments   Labs (all labs ordered are listed, but only abnormal results are displayed) Labs Reviewed  COMPREHENSIVE METABOLIC PANEL - Abnormal; Notable for the following components:      Result Value   Potassium 2.4 (*)    Chloride 92 (*)    Glucose, Bld 137 (*)    Creatinine, Ser 1.35 (*)    All other components within normal limits  URINALYSIS, ROUTINE W REFLEX MICROSCOPIC - Abnormal; Notable for the following components:   Color, Urine YELLOW (*)    APPearance CLOUDY (*)    Hgb urine dipstick SMALL (*)    Protein, ur 100 (*)    Leukocytes,Ua LARGE (*)  Bacteria, UA MANY (*)    All other components within normal limits  CHLAMYDIA/NGC RT PCR ([facility] ONLY)            LIPASE, BLOOD  CBC  MAGNESIUM  RPR     EKG EKG independently reviewed interpreted by myself (ER attending) demonstrates:  EKG demonstrates sinus rhythm at a rate of 100, PR 134, QRS 99, QTc 462, no acute ST changes  RADIOLOGY Imaging independently reviewed and interpreted by myself demonstrates:    PROCEDURES:  Critical Care performed: Yes, see critical care procedure note(s)  CRITICAL CARE Performed by: Trinna Post   Total critical care time: 32 minutes  Critical care time was exclusive of separately billable procedures and treating other patients.  Critical care was necessary to treat or prevent imminent or life-threatening deterioration.  Critical care was time spent personally by me on the following  activities: development of treatment plan with patient and/or surrogate as well as nursing, discussions with consultants, evaluation of patient's response to treatment, examination of patient, obtaining history from patient or surrogate, ordering and performing treatments and interventions, ordering and review of laboratory studies, ordering and review of radiographic studies, pulse oximetry and re-evaluation of patient's condition.   Procedures   MEDICATIONS ORDERED IN ED: Medications  potassium chloride 10 mEq in 100 mL IVPB (10 mEq Intravenous New Bag/Given 10/21/22 2044)  Ampicillin-Sulbactam (UNASYN) 3 g in sodium chloride 0.9 % 100 mL IVPB (3 g Intravenous New Bag/Given 10/21/22 2101)  potassium chloride SA (KLOR-CON M) CR tablet 40 mEq (40 mEq Oral Given 10/21/22 2011)     IMPRESSION / MDM / ASSESSMENT AND PLAN / ED COURSE  I reviewed the triage vital signs and the nursing notes.  Differential diagnosis includes, but is not limited to, UTI, STD, anemia, electrolyte abnormality, lower suspicion for significant acute intracranial process given neuro exam at baseline, low suspicion acute intra-abdominal process given reassuring abdominal exam  Patient's presentation is most consistent with acute presentation with potential threat to life or bodily function.  43 y/o male presenting to the ER for evaluation of dysuria and genital discomfort.  Initially reported abdominal pain in triage, but no significant tenderness to palpation on exam.  Neuroexam appears to be at his baseline.  Does have a lesion in his inguinal area with swelling as well as penile pain.  Known history of HIV, will send RPR as well as GC test.  Urine does appear infected.  No back pain or fevers to suggest pyelonephritis.  He does have a history of growing multiple prior organisms including E faecalis.  Reviewed with pharmacist Irving Burton, she does recommend Augmentin/Unasyn for initial treatment based on his prior organisms.  Of  note, patient does have a documented penicillin allergy, but has previously tolerated Augmentin and she does recommend proceeding with this initially.  I did also review the patient's medication list with her.  She noted that insulin can sometimes cause hypokalemia, but no other medications readily identifiable that would be likely to cause patient's hypokalemia.  Obvious cause for his hypokalemia based on his clinical history.  Magnesium level was added and returned within normal limits.  Patient ordered for IV and oral potassium repletion.  Did discuss admission given his degree of hypokalemia.  Patient is comfortable with this plan.  Will reach to the hospitalist team to discuss admission.   Reviewed with Dr. Allena Katz.  She will evaluate the patient for anticipated admission.     FINAL CLINICAL IMPRESSION(S) / ED DIAGNOSES   Final  diagnoses:  Hypokalemia  Acute cystitis without hematuria     Rx / DC Orders   ED Discharge Orders     None        Note:  This document was prepared using Dragon voice recognition software and may include unintentional dictation errors.   Trinna Post, MD 10/21/22 [id number]    Trinna Post, MD 10/21/22 [phone]

## 2022-10-21 NOTE — Assessment & Plan Note (Signed)
2/2 to gu discomfort prn tylenol , not abdominal pain .

## 2022-10-21 NOTE — H&P (Incomplete)
History and Physical    Patient: Cory Campbell ZOX:[id number] DOB: 06-Jan-1981 DOA: 10/21/2022 DOS: the patient was seen and examined on 10/22/2022 PCP: Emogene Morgan, MD  Patient coming from: Home   Chief Complaint:  Chief Complaint  Patient presents with   Abdominal Pain    HPI: Cory Campbell is a 42 y.o. male with medical history significant for Htn, Depression, HIV comes to [place] fro abdominal pain and dysuria with pain at tip of penis , no fever chills, headache eyes speech, cough, congestion,. N/V/D.  Patient initially came by EMS and was complaining of lower abdominal and penile pain.  Patient has a history of frequent UTI. Pt states he has dysuria and that why he came to ed x 4 days. When asked about swelling he says he does not have any swelling.  In the emergency room patient is alert awake afebrile, O2 sats of 98% on room air.  Blood work initially done in the ED shows hypokalemia of 2.4 glucose 137 AKI 1.35 EGFR of 60 normal LFTs, normal CBC.  Urinalysis today shows WBC more than 50 large leukocyte esterase nitrite negative cloudy urine. Nurse at bedside with me during exam.  Patient is established with urologist at [facility].  Review of Systems: Review of Systems  Genitourinary:  Positive for dysuria.  All other systems reviewed and are negative.   Past Medical History:  Diagnosis Date   (HFpEF) heart failure with preserved ejection fraction (HCC)    Acid reflux    Adopted    Anxiety    Arthritis    Arthrogryposis    Asthma    BPH (benign prostatic hyperplasia)    Bronchitis    Chronic pain syndrome    Depressive disorder, not elsewhere classified    Diabetes mellitus without complication (HCC)    Diverticulitis of colon (without mention of hemorrhage)(562.11)    ED (erectile dysfunction)    Herpes genitalis    HIV infection (HCC)    HTN (hypertension)    Hyperlipidemia    Hypertensive cardiomyopathy (HCC)    Hypogonadism in male    OAB (overactive bladder)     Other psoriasis    Seizure disorder (HCC)    Sleep apnea    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 or other cause, without mention of thyrotoxic crisis or storm    hyperthyroidism   Past Surgical History:  Procedure Laterality Date   APPENDECTOMY     COLOSTOMY     CORNEAL TRANSPLANT     DIALYSIS/PERMA CATHETER INSERTION Right 03/10/2022   Procedure: DIALYSIS/PERMA CATHETER INSERTION;  Surgeon: Renford Dills, MD;  Location: [facility] INVASIVE CV LAB;  Service: Cardiovascular;  Laterality: Right;   DIALYSIS/PERMA CATHETER REMOVAL N/A 04/18/2022   Procedure: DIALYSIS/PERMA CATHETER REMOVAL;  Surgeon: Renford Dills, MD;  Location: [facility] INVASIVE CV LAB;  Service: Cardiovascular;  Laterality: N/A;   feet surgery     both   HAND SURGERY     left and right   leg surgery     left and right   ORTHOPEDIC SURGERY     hands, feet, knees, legs   tubes in ears     both   Social History:   reports that he quit smoking about 4 years ago. His smoking use included pipe, cigars, and e-cigarettes. He has never used smokeless tobacco. He reports that he does not currently use alcohol after a past usage of about 3.0 standard drinks of alcohol per week. He reports that he does not  use drugs.  Allergies  Allergen Reactions   Penicillins Hives, Itching and Rash    Has patient had a PCN reaction causing immediate rash, facial/tongue/throat swelling, SOB or lightheadedness with hypotension: Yes  Has patient had a PCN reaction causing severe rash involving mucus membranes or skin necrosis: No  Has patient had a PCN reaction that required hospitalization No  Has patient had a PCN reaction occurring within the last 10 years: No  If all of the above answers are "NO", then may proceed with Cephalosporin use.  Has tolerated amoxicillin without problems  Has patient had a PCN reaction causing immediate rash, facial/tongue/throat swelling, SOB or lightheadedness with hypotension: Yes    Has patient had  a PCN reaction causing severe rash involving mucus membranes or skin necrosis: No    Has patient had a PCN reaction that required hospitalization No    Has patient had a PCN reaction occurring within the last 10 years: No    If all of the above answers are "NO", then may proceed with Cephalosporin use.    Has patient had a PCN reaction causing immediate rash, facial/tongue/throat swelling, SOB or lightheadedness with hypotension: Yes Has patient had a PCN reaction causing severe rash involving mucus membranes or skin necrosis: No Has patient had a PCN reaction that required hospitalization No Has patient had a PCN reaction occurring within the last 10 years: No If all of the above answers are "NO", then may proceed with Cephalosporin use.  Has tolerated amoxicillin without problems   Erythromycin Base Itching and Rash    Family History  Adopted: Yes  Family history unknown: Yes    Prior to Admission medications   Medication Sig Start Date End Date Taking? Authorizing Provider  acyclovir (ZOVIRAX) 200 MG capsule Take 1 capsule (200 mg total) by mouth 2 (two) times daily. 03/16/22   Willeen Niece, MD  albuterol (PROVENTIL HFA;VENTOLIN HFA) 108 (90 BASE) MCG/ACT inhaler Inhale 2 puffs into the lungs every 4 (four) hours as needed for wheezing or shortness of breath.     [provider]  albuterol (PROVENTIL) (2.5 MG/3ML) 0.083% nebulizer solution Take 2.5 mg by nebulization every 4 (four) hours as needed for wheezing or shortness of breath.    [provider]  amLODipine (NORVASC) 10 MG tablet Take 10 mg by mouth daily.    [provider]  BIKTARVY 50-200-25 MG TABS tablet Take 1 tablet by mouth daily. 11/21/16   [provider]  buprenorphine (BUTRANS) 7.5 MCG/HR Place 7.5 mg onto the skin once a week. Saturday    [provider]  buPROPion (WELLBUTRIN XL) 150 MG 24 hr tablet Take 150 mg by mouth daily. 08/11/21   [provider]  CABENUVA  600 & 900 MG/3ML injection INJECT A TOTAL OF 6 ML IN THE MUSCLE ONCE A MONTH FOR 2 MONTHS THEN USE EVERY 2 MONTHS THEREAFTER. 06/07/22   [provider]  cetirizine (ZYRTEC) 10 MG tablet Take 10 mg by mouth daily.    [provider]  citalopram (CELEXA) 20 MG tablet Take 20 mg by mouth daily.    [provider]  cloNIDine (CATAPRES - DOSED IN MG/24 HR) 0.3 mg/24hr patch Place 0.3 mg onto the skin once a week. 05/04/22 05/04/23  [provider]  clotrimazole-betamethasone (LOTRISONE) cream Apply to affected area 2 times daily prn 10/07/22   Lorre Munroe, NP  cyclobenzaprine (FLEXERIL) 5 MG tablet Take 5 mg by mouth 3 (three) times daily as needed. 07/11/22  [provider]  diclofenac sodium (VOLTAREN) 1 % GEL Apply 2-4 g topically 4 (four) times daily as needed. For pain.    [provider]  dicyclomine (BENTYL) 10 MG capsule Take 20 mg by mouth 2 (two) times daily before a meal. 05/01/21   [provider]  Elastic Bandages & Supports (T.E.D. BELOW KNEE/MEDIUM) MISC 1 each by Does not apply route daily. Measure legs and give appropriate size. 09/12/21   Calvert Cantor, MD  famotidine (PEPCID) 20 MG tablet Take 20 mg by mouth daily.  01/03/16   [provider]  feeding supplement, GLUCERNA SHAKE, (GLUCERNA SHAKE) LIQD Take 237 mLs by mouth 4 (four) times daily.    [provider]  ferrous sulfate 325 (65 FE) MG tablet Take 325 mg by mouth daily with breakfast.    [provider]  finasteride (PROSCAR) 5 MG tablet Take 5 mg by mouth daily.    [provider]  fluticasone (FLONASE) 50 MCG/ACT nasal spray Place 2 sprays into both nostrils daily. 07/04/22   Domenick Gong, MD  fluticasone (FLOVENT HFA) 110 MCG/ACT inhaler Inhale 2 puffs into the lungs 2 (two) times daily.    [provider]  gabapentin (NEURONTIN) 300 MG capsule Take 1 capsule (300 mg total) by mouth 2 (two) times daily. Patient taking  differently: Take 300 mg by mouth 3 (three) times daily. 09/12/21 07/24/22  Calvert Cantor, MD  glucose 4 GM chewable tablet Chew 1 tablet by mouth as needed for low blood sugar.    [provider]  hydrALAZINE (APRESOLINE) 50 MG tablet Take 1 tablet (50 mg total) by mouth 3 (three) times daily. 03/16/22   Willeen Niece, MD  insulin degludec (TRESIBA) 100 UNIT/ML FlexTouch Pen Inject 100 Units into the skin 2 (two) times daily.    [provider]  ipratropium (ATROVENT) 0.06 % nasal spray Place 2 sprays into both nostrils 4 (four) times daily. 08/29/21   Becky Augusta, NP  isosorbide dinitrate (ISORDIL) 20 MG tablet Take 1 tablet (20 mg total) by mouth 3 (three) times daily. 03/16/22   Willeen Niece, MD  Melatonin 5 MG TABS Take 10 mg by mouth at bedtime as needed (sleep).    [provider]  metFORMIN (GLUCOPHAGE-XR) 500 MG 24 hr tablet Take 500 mg by mouth 2 (two) times daily with a meal. 06/23/22 06/23/23  [provider]  metoprolol succinate (TOPROL-XL) 100 MG 24 hr tablet Take 1 tablet (100 mg total) by mouth 2 (two) times daily. Take with or immediately following a meal. 07/24/22 10/22/22  Antonieta Iba, MD  mirtazapine (REMERON) 45 MG tablet Take 45 mg by mouth at bedtime.    [provider]  Multiple Vitamin (MULTIVITAMIN) tablet Take 1 tablet by mouth daily.    [provider]  nitroGLYCERIN (NITROSTAT) 0.4 MG SL tablet PLACE 1 TABLET UNDER THE TONGUE EVERY 5 MINUTES AS NEEDED FOR CHEST PAIN. CALL 911 IF YOU HAVE TAKEN 3 TABLETS FOR CHEST PAIN 08/03/22   Antonieta Iba, MD  nortriptyline (PAMELOR) 25 MG capsule Take 50 mg by mouth at bedtime.    [provider]  omega-3 acid ethyl esters (LOVAZA) 1 g capsule Take 1 g by mouth 3 (three) times daily.    [provider]  omeprazole (PRILOSEC) 20 MG capsule Take 20 mg by mouth daily. 09/08/21   [provider]  ondansetron (ZOFRAN-ODT) 4 MG disintegrating tablet Take 4 mg  by mouth every 8 (eight) hours as needed for  nausea or vomiting.    [provider]  polyethylene glycol (MIRALAX / GLYCOLAX) 17 g packet Take 17 g by mouth 3 (three) times daily.    [provider]  prednisoLONE acetate (PRED FORTE) 1 % ophthalmic suspension Place 1 drop into the right eye every 2 (two) hours while awake. 06/21/22   [provider]  rosuvastatin (CRESTOR) 40 MG tablet TAKE 1 TABLET(40 MG) BY MOUTH DAILY 04/30/19   Antonieta Iba, MD  solifenacin (VESICARE) 10 MG tablet Take 10 mg by mouth daily.    [provider]  Spacer/Aero-Holding Chambers (AEROCHAMBER PLUS) inhaler Use as instructed 03/23/18   Domenick Gong, MD  TRULICITY 4.5 MG/0.5ML SOPN SMARTSIG:4.5 Milligram(s) SUB-Q Once a Week 09/23/21   [provider]     Vitals:   10/21/22 2230 10/21/22 2330 10/21/22 2335 10/22/22 0018  BP: 101/68 107/73 110/73 121/84  Pulse:    89  Resp: 19 16 15 16   Temp:    98 F (36.7 C)  TempSrc:    Oral  SpO2:    (!) 89%   Physical Exam Vitals and nursing note reviewed.  Constitutional:      General: He is not in acute distress.    Appearance: He is underweight.  HENT:     Head: Normocephalic and atraumatic.     Right Ear: Hearing normal.     Left Ear: Hearing normal.     Nose: Nose normal. No nasal deformity.     Mouth/Throat:     Lips: Pink.  Eyes:     General: Lids are normal.     Extraocular Movements: Extraocular movements intact.  Cardiovascular:     Rate and Rhythm: Normal rate and regular rhythm.     Heart sounds: Normal heart sounds.  Pulmonary:     Effort: Pulmonary effort is normal.     Breath sounds: Normal breath sounds.  Abdominal:     General: Bowel sounds are normal. There is no distension.     Palpations: Abdomen is soft. There is no mass.     Tenderness: There is no abdominal tenderness.  Musculoskeletal:     Right lower leg: No edema.     Left lower leg: No edema.  Skin:    General: Skin is warm.   Neurological:     General: No focal deficit present.     Mental Status: He is alert and oriented to person, place, and time.     Cranial Nerves: Cranial nerves 2-12 are intact.  Psychiatric:        Attention and Perception: Attention normal.        Mood and Affect: Mood normal.        Speech: Speech normal.        Behavior: Behavior normal. Behavior is cooperative.      Labs on Admission: I have personally reviewed following labs and imaging studies  CBC: Recent Labs  Lab 10/21/22 1800  WBC 8.3  HGB 13.7  HCT 42.8  MCV 85.9  PLT 277   Basic Metabolic Panel: Recent Labs  Lab 10/21/22 1800  NA 135  K 2.4*  CL 92*  CO2 28  GLUCOSE 137*  BUN 16  CREATININE 1.35*  CALCIUM 9.4  MG 2.1   GFR: CrCl cannot be calculated (Unknown ideal weight.). Liver Function Tests: Recent Labs  Lab 10/21/22 1800  AST 17  ALT 18  ALKPHOS 58  BILITOT 0.5  PROT 7.5  ALBUMIN 3.6   Recent Labs  Lab  10/21/22 1800  LIPASE 35   No results for input(s): "AMMONIA" in the last 168 hours. Coagulation Profile: No results for input(s): "INR", "PROTIME" in the last 168 hours. Cardiac Enzymes: No results for input(s): "CKTOTAL", "CKMB", "CKMBINDEX", "TROPONINI" in the last 168 hours. BNP (last 3 results) No results for input(s): "PROBNP" in the last 8760 hours. HbA1C: No results for input(s): "HGBA1C" in the last 72 hours. CBG: Recent Labs  Lab 10/22/22 0117  GLUCAP 88   Lipid Profile: No results for input(s): "CHOL", "HDL", "LDLCALC", "TRIG", "CHOLHDL", "LDLDIRECT" in the last 72 hours. Thyroid Function Tests: No results for input(s): "TSH", "T4TOTAL", "FREET4", "T3FREE", "THYROIDAB" in the last 72 hours. Anemia Panel: No results for input(s): "VITAMINB12", "FOLATE", "FERRITIN", "TIBC", "IRON", "RETICCTPCT" in the last 72 hours. Urinalysis    Component Value Date/Time   COLORURINE YELLOW (A) 10/21/2022 1800   APPEARANCEUR CLOUDY (A) 10/21/2022 1800   APPEARANCEUR Cloudy  05/16/2014 0350   LABSPEC 1.015 10/21/2022 1800   LABSPEC 1.018 05/16/2014 0350   PHURINE 6.0 10/21/2022 1800   GLUCOSEU NEGATIVE 10/21/2022 1800   GLUCOSEU Negative 05/16/2014 0350   HGBUR SMALL (A) 10/21/2022 1800   BILIRUBINUR NEGATIVE 10/21/2022 1800   BILIRUBINUR Negative 05/16/2014 0350   KETONESUR NEGATIVE 10/21/2022 1800   PROTEINUR 100 (A) 10/21/2022 1800   NITRITE NEGATIVE 10/21/2022 1800   LEUKOCYTESUR LARGE (A) 10/21/2022 1800   LEUKOCYTESUR 1+ 05/16/2014 0350   Unresulted Labs (From admission, onward)     Start     Ordered   10/22/22 0500  Comprehensive metabolic panel  Tomorrow morning,   R        10/21/22 2248   10/22/22 0500  CBC  Tomorrow morning,   R        10/21/22 2248   10/21/22 2239  Hemoglobin A1c  (Glycemic Control (SSI)  Q 4 Hours / Glycemic Control (SSI)  AC +/- HS)  Once,   R       Comments: To assess prior glycemic control    10/21/22 2248   10/21/22 2237  Hemoglobin A1c  Once,   R       Comments: To assess prior glycemic control    10/21/22 2248   10/21/22 2237  CBC  (heparin)  Once,   R       Comments: Baseline for heparin therapy IF NOT ALREADY DRAWN.  Notify MD if PLT < 100 K.    10/21/22 2248   10/21/22 2237  Creatinine, serum  (heparin)  Once,   R       Comments: Baseline for heparin therapy IF NOT ALREADY DRAWN.    10/21/22 2248   10/21/22 2013  RPR  Once,   URGENT        10/21/22 2012            Medications  fluticasone (FLONASE) 50 MCG/ACT nasal spray 2 spray (has no administration in time range)  gabapentin (NEURONTIN) capsule 300 mg (300 mg Oral Given 10/21/22 2341)  rosuvastatin (CRESTOR) tablet 40 mg (has no administration in time range)  albuterol (PROVENTIL) (2.5 MG/3ML) 0.083% nebulizer solution 2.5 mg (has no administration in time range)  bictegravir-emtricitabine-tenofovir AF (BIKTARVY) 50-200-25 MG per tablet 1 tablet (has no administration in time range)  buPROPion (WELLBUTRIN XL) 24 hr tablet 150 mg (has no  administration in time range)  citalopram (CELEXA) tablet 20 mg (has no administration in time range)  cloNIDine (CATAPRES - Dosed in mg/24 hr) patch 0.3 mg (0.3 mg Transdermal Not Given 10/22/22  0200)  nortriptyline (PAMELOR) capsule 50 mg (50 mg Oral Given 10/21/22 2342)  mirtazapine (REMERON) tablet 45 mg (45 mg Oral Given 10/21/22 2341)  acyclovir (ZOVIRAX) 200 MG capsule 200 mg (has no administration in time range)  ondansetron (ZOFRAN-ODT) disintegrating tablet 4 mg (has no administration in time range)  prednisoLONE acetate (PRED FORTE) 1 % ophthalmic suspension 1 drop (has no administration in time range)  heparin injection 5,000 Units (5,000 Units Subcutaneous Given 10/21/22 2342)  sodium chloride flush (NS) 0.9 % injection 3 mL (has no administration in time range)  0.9 %  sodium chloride infusion ( Intravenous New Bag/Given 10/22/22 0100)  acetaminophen (TYLENOL) tablet 650 mg (has no administration in time range)    Or  acetaminophen (TYLENOL) suppository 650 mg (has no administration in time range)  HYDROcodone-acetaminophen (NORCO/VICODIN) 5-325 MG per tablet 1 tablet (has no administration in time range)  morphine (PF) 2 MG/ML injection 2 mg (has no administration in time range)  insulin aspart (novoLOG) injection 0-9 Units (has no administration in time range)  metoprolol succinate (TOPROL-XL) 24 hr tablet 50 mg (has no administration in time range)  isosorbide dinitrate (ISORDIL) tablet 20 mg (has no administration in time range)  hydrALAZINE (APRESOLINE) tablet 50 mg (has no administration in time range)  nitroGLYCERIN (NITROSTAT) SL tablet 0.4 mg (has no administration in time range)  potassium chloride SA (KLOR-CON M) CR tablet 40 mEq (40 mEq Oral Given 10/21/22 2011)  potassium chloride 10 mEq in 100 mL IVPB (0 mEq Intravenous Stopped 10/22/22 0126)  Ampicillin-Sulbactam (UNASYN) 3 g in sodium chloride 0.9 % 100 mL IVPB (0 g Intravenous Stopped 10/21/22 2144)  potassium chloride  10 mEq in 100 mL IVPB (10 mEq Intravenous New Bag/Given 10/22/22 0130)    Radiological Exams on Admission: No results found.   Data Reviewed: Relevant notes from primary care and specialist visits, past discharge summaries as available in EHR, including Care Everywhere. Prior diagnostic testing as pertinent to current admission diagnoses Updated medications and problem lists for reconciliation ED course, including vitals, labs, imaging, treatment and response to treatment Triage notes, nursing and pharmacy notes and ED provider's notes Notable results as noted in HPI  Assessment & Plan Abdominal pain 2/2 to gu discomfort prn tylenol , not abdominal pain .  AKI (acute kidney injury) [facility]) Lab Results  Component Value Date   CREATININE 1.35 (H) 10/21/2022   CREATININE 1.54 (H) 04/07/2022   CREATININE 2.57 (H) 03/16/2022  Improving renal function.  Avoid contrast. Renally dose meds.  Hold metformin.   UTI (urinary tract infection) Pt has h/o rec UTI and ESBL. Cont pt on unasyn as pt has h/o ESBL. HIV (human immunodeficiency virus infection) (HCC) Cont on Byktarvy   Chronic diastolic CHF (congestive heart failure) (HCC) Stable euvolemic.  Cont Crestor, metoprolol with reduced dose at 50 mg, Imdur continued hydralazine continued at current dose.   Tobacco dependence Nicotine patch.  Diabetes mellitus (HCC) Hold metformin due to elevated creatinine. Hold tresiba and trulicity  Glycemic protocol. Seizure disorder (HCC) No AED . Pt states he had seizure long time ago and has since then been off it Seizures precaution.   Essential hypertension  Vitals:   10/21/22 1758 10/21/22 2130 10/21/22 2200 10/21/22 2230  BP: 114/81 111/83 100/74 101/68   10/21/22 2330 10/21/22 2335 10/22/22 0018  BP: 107/73 110/73 121/84  BP med doses lowered as follows due to low normal blood pressures. Toprol xl 50 daily.  Imdur continued at current dose of 20 mg.  Patient also continued on  same dose hydralazine at 50 3 times daily. Amlodipine and clonidine is held.  BPH (benign prostatic hyperplasia) Cont flomax once med rec available.  Hypokalemia Replaced in ed and pharmacy consult.   DVT prophylaxis:  Heparin   Consults:  None   Advance Care Planning:    Code Status: Full Code   Family Communication:  None   Disposition Plan:  TBD   Severity of Illness: The appropriate patient status for this patient is OBSERVATION. Observation status is judged to be reasonable and necessary in order to provide the required intensity of service to ensure the patient's safety. The patient's presenting symptoms, physical exam findings, and initial radiographic and laboratory data in the context of their medical condition is felt to place them at decreased risk for further clinical deterioration. Furthermore, it is anticipated that the patient will be medically stable for discharge from the hospital within 2 midnights of admission.   Author: Gertha Calkin, MD 10/22/2022 3:36 AM  For on call review [email or web address].

## 2022-10-21 NOTE — Progress Notes (Signed)
PHARMACY CONSULT NOTE - FOLLOW UP  Pharmacy Consult for Electrolyte Monitoring and Replacement   Recent Labs: Potassium (mmol/L)  Date Value  10/21/2022 2.4 (LL)  08/28/2013 2.9 (L)   Magnesium (mg/dL)  Date Value  57/84/6962 2.1  02/09/2012 1.7 (L)   Calcium (mg/dL)  Date Value  95/28/4132 9.4   Calcium, Total (mg/dL)  Date Value  44/02/270 7.8 (L)   Albumin (g/dL)  Date Value  53/66/4403 3.6  02/09/2012 3.9   Phosphorus (mg/dL)  Date Value  47/42/5956 4.7 (H)   Sodium (mmol/L)  Date Value  10/21/2022 135  07/07/2020 139  08/28/2013 134 (L)     Assessment: 9/14:  K @ 1800 = 2.4    Goal of Therapy:  Electrolytes WNL   Plan:  KCl 40 mEq PO X 1 given in ED @ 2011. KCl 10 mEq IV X 2 given @ 2044. - Will order additional KCl 10 mEq IV X 2 and recheck electrolytes on 9/15 @ 0500.   Scherrie Gerlach ,PharmD Clinical Pharmacist 10/21/2022 11:04 PM

## 2022-10-21 NOTE — Assessment & Plan Note (Signed)
  Cont flomax once med rec available.

## 2022-10-22 DIAGNOSIS — E876 Hypokalemia: Secondary | ICD-10-CM

## 2022-10-22 DIAGNOSIS — N3000 Acute cystitis without hematuria: Secondary | ICD-10-CM | POA: Diagnosis not present

## 2022-10-22 LAB — URINALYSIS, W/ REFLEX TO CULTURE (INFECTION SUSPECTED)
Bilirubin Urine: NEGATIVE
Glucose, UA: NEGATIVE mg/dL
Ketones, ur: 5 mg/dL — AB
Nitrite: NEGATIVE
Protein, ur: 100 mg/dL — AB
Specific Gravity, Urine: 1.008 (ref 1.005–1.030)
WBC, UA: >50 WBC/hpf (ref 0–5)
pH: 7 (ref 5.0–8.0)

## 2022-10-22 LAB — CBC
HCT: 38.6 % — ABNORMAL LOW (ref 39.0–52.0)
Hemoglobin: 12.6 g/dL — ABNORMAL LOW (ref 13.0–17.0)
MCH: 27.5 pg (ref 26.0–34.0)
MCHC: 32.6 g/dL (ref 30.0–36.0)
MCV: 84.1 fL (ref 80.0–100.0)
Platelets: 260 10*3/uL (ref 150–400)
RBC: 4.59 MIL/uL (ref 4.22–5.81)
RDW: 14.6 % (ref 11.5–15.5)
WBC: 8.1 10*3/uL (ref 4.0–10.5)
nRBC: 0 % (ref 0.0–0.2)

## 2022-10-22 LAB — RPR: RPR Ser Ql: NONREACTIVE

## 2022-10-22 LAB — COMPREHENSIVE METABOLIC PANEL
ALT: 14 U/L (ref 0–44)
AST: 12 U/L — ABNORMAL LOW (ref 15–41)
Albumin: 2.7 g/dL — ABNORMAL LOW (ref 3.5–5.0)
Alkaline Phosphatase: 57 U/L (ref 38–126)
Anion gap: 12 (ref 5–15)
BUN: 12 mg/dL (ref 6–20)
CO2: 26 mmol/L (ref 22–32)
Calcium: 8.3 mg/dL — ABNORMAL LOW (ref 8.9–10.3)
Chloride: 102 mmol/L (ref 98–111)
Creatinine, Ser: 1.28 mg/dL — ABNORMAL HIGH (ref 0.61–1.24)
GFR, Estimated: >60 mL/min (ref >60)
Glucose, Bld: 120 mg/dL — ABNORMAL HIGH (ref 70–99)
Potassium: 3 mmol/L — ABNORMAL LOW (ref 3.5–5.1)
Sodium: 140 mmol/L (ref 135–145)
Total Bilirubin: 0.8 mg/dL (ref 0.3–1.2)
Total Protein: 6.5 g/dL (ref 6.5–8.1)

## 2022-10-22 LAB — GLUCOSE, CAPILLARY
Glucose-Capillary: 88 mg/dL (ref 70–99)
Glucose-Capillary: 131 mg/dL — ABNORMAL HIGH (ref 70–99)
Glucose-Capillary: 115 mg/dL — ABNORMAL HIGH (ref 70–99)
Glucose-Capillary: 171 mg/dL — ABNORMAL HIGH (ref 70–99)

## 2022-10-22 MED ORDER — AMOXICILLIN 500 MG PO TABS: 500.0000 mg | ORAL_TABLET | Freq: Two times a day (BID) | ORAL | 0 refills | Status: AC

## 2022-10-22 MED ORDER — POTASSIUM CHLORIDE CRYS ER 20 MEQ PO TBCR
40.0000 meq | EXTENDED_RELEASE_TABLET | Freq: Two times a day (BID) | ORAL | Status: DC
  Administered 2022-10-22: 40 meq via ORAL
  Filled 2022-10-22: qty 2

## 2022-10-22 MED ORDER — POTASSIUM CHLORIDE CRYS ER 20 MEQ PO TBCR: 20.0000 meq | EXTENDED_RELEASE_TABLET | Freq: Every day | ORAL | 0 refills | Status: DC

## 2022-10-22 MED ORDER — POTASSIUM CHLORIDE CRYS ER 20 MEQ PO TBCR: 40.0000 meq | EXTENDED_RELEASE_TABLET | Freq: Once | ORAL | Status: DC

## 2022-10-22 MED ORDER — INSULIN DEGLUDEC 100 UNIT/ML ~~LOC~~ SOPN: 24.0000 [IU] | PEN_INJECTOR | Freq: Two times a day (BID) | SUBCUTANEOUS | Status: DC

## 2022-10-22 NOTE — Progress Notes (Incomplete)
PROGRESS NOTE  Cory Campbell    DOB: 1980-04-20, 42 y.o.  ZOX:[id number]    Code Status: Full Code   DOA: 10/21/2022   LOS: 1   Brief hospital course  Cory Campbell is a 42 y.o. male with a PMH significant for ***.  They presented from *** to the ED on 10/21/2022 with *** x *** days. ***  In the ED, it was found that they had ***.  Significant findings included ***.  They were initially treated with ***.   Patient was admitted to medicine service for further workup and management of *** as outlined in detail below.  10/22/22 -***  Assessment & Plan  Principal Problem:   Abdominal pain Active Problems:   AKI (acute kidney injury) (HCC)   UTI (urinary tract infection)   Hypokalemia   HIV (human immunodeficiency virus infection) (HCC)   BPH (benign prostatic hyperplasia)   Essential hypertension   Chronic diastolic CHF (congestive heart failure) (HCC)   Tobacco dependence   Diabetes mellitus (HCC)   Arthrogryposis, with contractures and functional paraplegia   Seizure disorder (HCC)  *** -   *** -   *** -   *** -   *** -   Body mass index is 23.66 kg/m.  VTE ppx: heparin injection 5,000 Units Start: 10/21/22 2300   Diet:     Diet   Diet NPO time specified Except for: Sips with Meds   Consultants: ***  Subjective 10/22/22    Pt reports ***   Objective   Vitals:   10/21/22 2335 10/22/22 0018 10/22/22 0400 10/22/22 0500  BP: 110/73 121/84 (!) 129/91   Pulse:  89 (!) 105   Resp: 15 16 20    Temp:  98 F (36.7 C) 98.7 F (37.1 C)   TempSrc:  Oral Oral   SpO2:  (!) 89% 99%   Weight:    66.5 kg    Intake/Output Summary (Last 24 hours) at 10/22/2022 0757 Last data filed at 10/22/2022 0600 Gross per 24 hour  Intake 200 ml  Output 1300 ml  Net -1100 ml   Filed Weights   10/22/22 0500  Weight: 66.5 kg     Physical Exam: *** General: awake, alert, NAD HEENT: atraumatic, clear conjunctiva, anicteric sclera, MMM, hearing grossly  normal Respiratory: normal respiratory effort. Cardiovascular: quick capillary refill, normal S1/S2, RRR, no JVD, murmurs Gastrointestinal: soft, NT, ND Nervous: A&O x3. no gross focal neurologic deficits, normal speech Extremities: moves all equally, no edema, normal tone Skin: dry, intact, normal temperature, normal color. No rashes, lesions or ulcers on exposed skin Psychiatry: normal mood, congruent affect  Labs   I have personally reviewed the following labs and imaging studies CBC    Component Value Date/Time   WBC 8.1 10/22/2022 0503   RBC 4.59 10/22/2022 0503   HGB 12.6 (L) 10/22/2022 0503   HGB 9.2 (L) 05/16/2017 1521   HCT 38.6 (L) 10/22/2022 0503   HCT 28.3 (L) 05/16/2017 1521   PLT 260 10/22/2022 0503   PLT 798 (H) 05/16/2017 1521   MCV 84.1 10/22/2022 0503   MCV 85 05/16/2017 1521   MCV 85 08/28/2013 0024   MCH 27.5 10/22/2022 0503   MCHC 32.6 10/22/2022 0503   RDW 14.6 10/22/2022 0503   RDW 13.8 05/16/2017 1521   RDW 13.1 08/28/2013 0024   LYMPHSABS 1.6 03/14/2022 0438   LYMPHSABS 0.7 05/16/2017 1521   MONOABS 0.5 03/14/2022 0438   EOSABS 0.1 03/14/2022 0438   EOSABS 0.0  05/16/2017 1521   BASOSABS 0.1 03/14/2022 0438   BASOSABS 0.0 05/16/2017 1521      Latest Ref Rng & Units 10/22/2022    5:03 AM 10/21/2022    6:00 PM 04/07/2022   12:04 PM  BMP  Glucose 70 - 99 mg/dL 440  102  725   BUN 6 - 20 mg/dL 12  16  27    Creatinine 0.61 - 1.24 mg/dL 3.66  4.40  3.47   Sodium 135 - 145 mmol/L 140  135  134   Potassium 3.5 - 5.1 mmol/L 3.0  2.4  5.1   Chloride 98 - 111 mmol/L 102  92  101   CO2 22 - 32 mmol/L 26  28  24    Calcium 8.9 - 10.3 mg/dL 8.3  9.4  9.8     No results found.  Disposition Plan & Communication  Patient status: Inpatient  Admitted From: {From:23814} Planned disposition location: {PLAN; DISPOSITION:26386} Anticipated discharge date: *** pending ***  Family Communication: ***    Author: Leeroy Bock, DO [facility] 10/22/2022, 7:57 AM   Available by Epic secure chat 7AM-7PM. If 7PM-7AM, please contact night-coverage.  [facility] contact information found on [email or web address].

## 2022-10-22 NOTE — Progress Notes (Signed)
CSW spoke with patient at bedside who requests EMS transport home, reports no additional dc needs, states family at home to receive him.   EMS has been called, EMS forms on chart.   Darolyn Rua, [place], MSW, [place] [phone]

## 2022-10-22 NOTE — Assessment & Plan Note (Signed)
2/2 to gu discomfort prn tylenol , not abdominal pain . No swelling on abd exam / no pan in abd exam.

## 2022-10-22 NOTE — Discharge Instructions (Signed)
Please continue to take your antibiotic until complete to treat your urinary tract infection.  I recommend making an appointment with your PCP within a week to recheck your potassium level and ensure that your urinary tract infection has resolved.

## 2022-10-22 NOTE — Progress Notes (Signed)
PHARMACY CONSULT NOTE - FOLLOW UP  Pharmacy Consult for Electrolyte Monitoring and Replacement   Recent Labs: Potassium (mmol/L)  Date Value  10/22/2022 3.0 (L)  08/28/2013 2.9 (L)   Magnesium (mg/dL)  Date Value  16/11/9602 2.1  02/09/2012 1.7 (L)   Calcium (mg/dL)  Date Value  54/10/8117 8.3 (L)   Calcium, Total (mg/dL)  Date Value  14/78/2956 7.8 (L)   Albumin (g/dL)  Date Value  21/30/8657 2.7 (L)  02/09/2012 3.9   Phosphorus (mg/dL)  Date Value  84/69/6295 4.7 (H)   Sodium (mmol/L)  Date Value  10/22/2022 140  07/07/2020 139  08/28/2013 134 (L)     Assessment: 9/15:  K @ 0503 = 3.0  Goal of Therapy:  Electrolytes WNL   Plan:  Will order additional KCl 40 mEq PO X 1 and recheck electrolytes.   Scherrie Gerlach ,PharmD Clinical Pharmacist 10/22/2022 6:31 AM

## 2022-10-22 NOTE — Discharge Summary (Signed)
Physician Discharge Summary  Patient: Cory Campbell VZD:[id number] DOB: [birth year]   Code Status: Full Code Admit date: 10/21/2022 Discharge date: 10/22/2022 Disposition: Home, No home health services recommended PCP: Emogene Morgan, MD  Recommendations for Outpatient Follow-up:  Follow up with PCP within 1-2 weeks Regarding general hospital follow up and preventative care Recommend    Discharge Diagnoses:  Principal Problem:   Abdominal pain Active Problems:   AKI (acute kidney injury) (HCC)   UTI (urinary tract infection)   Hypokalemia   HIV (human immunodeficiency virus infection) (HCC)   BPH (benign prostatic hyperplasia)   Essential hypertension   Chronic diastolic CHF (congestive heart failure) (HCC)   Tobacco dependence   Diabetes mellitus (HCC)   Arthrogryposis, with contractures and functional paraplegia   Seizure disorder [facility])  Brief Hospital Course Summary: Cory Campbell is a 42 y.o. male with a PMH significant for ***.   They presented from *** to the ED on 10/21/2022 with *** x *** days. ***   In the ED, it was found that they had ***.  Significant findings included ***.   They were initially treated with ***.    Patient was admitted to medicine service for further workup and management of *** as outlined in detail below.   10/22/22 -***  Discharge Condition: {DISCHARGE CONDITION:19696}, improved Recommended discharge diet: {Discharge IRJJ:[id number]}  Consultations: ***  Procedures/Studies: ***   Allergies as of 10/22/2022       Reactions   Penicillins Hives, Itching, Rash   Has patient had a PCN reaction causing immediate rash, facial/tongue/throat swelling, SOB or lightheadedness with hypotension: Yes Has patient had a PCN reaction causing severe rash involving mucus membranes or skin necrosis: No Has patient had a PCN reaction that required hospitalization No Has patient had a PCN reaction occurring within the last 10 years: No If  all of the above answers are "NO", then may proceed with Cephalosporin use. Has tolerated amoxicillin without problems Has patient had a PCN reaction causing immediate rash, facial/tongue/throat swelling, SOB or lightheadedness with hypotension: Yes    Has patient had a PCN reaction causing severe rash involving mucus membranes or skin necrosis: No    Has patient had a PCN reaction that required hospitalization No    Has patient had a PCN reaction occurring within the last 10 years: No    If all of the above answers are "NO", then may proceed with Cephalosporin use.    Has patient had a PCN reaction causing immediate rash, facial/tongue/throat swelling, SOB or lightheadedness with hypotension: Yes Has patient had a PCN reaction causing severe rash involving mucus membranes or skin necrosis: No Has patient had a PCN reaction that required hospitalization No Has patient had a PCN reaction occurring within the last 10 years: No If all of the above answers are "NO", then may proceed with Cephalosporin use.  Has tolerated amoxicillin without problems   Erythromycin Base Itching, Rash        Medication List     TAKE these medications    acyclovir 200 MG capsule Commonly known as: ZOVIRAX Take 1 capsule (200 mg total) by mouth 2 (two) times daily.   AeroChamber Plus inhaler Use as instructed   albuterol (2.5 MG/3ML) 0.083% nebulizer solution Commonly known as: PROVENTIL Take 2.5 mg by nebulization every 4 (four) hours as needed for wheezing or shortness of breath.   albuterol 108 (90 Base) MCG/ACT inhaler Commonly known as: VENTOLIN HFA Inhale 2 puffs into the lungs  every 4 (four) hours as needed for wheezing or shortness of breath.   amLODipine 10 MG tablet Commonly known as: NORVASC Take 10 mg by mouth daily.   amoxicillin 500 MG tablet Commonly known as: AMOXIL Take 1 tablet (500 mg total) by mouth 2 (two) times daily for 5 days.   Biktarvy 50-200-25 MG Tabs tablet Generic  drug: bictegravir-emtricitabine-tenofovir AF Take 1 tablet by mouth daily.   buprenorphine 7.5 MCG/HR Commonly known as: BUTRANS Place 7.5 mg onto the skin once a week. Saturday   buPROPion 150 MG 24 hr tablet Commonly known as: WELLBUTRIN XL Take 150 mg by mouth daily.   Cabenuva 600 & 900 MG/3ML injection Generic drug: cabotegravir & rilpivirine ER INJECT A TOTAL OF 6 ML IN THE MUSCLE ONCE A MONTH FOR 2 MONTHS THEN USE EVERY 2 MONTHS THEREAFTER.   cetirizine 10 MG tablet Commonly known as: ZYRTEC Take 10 mg by mouth daily.   citalopram 20 MG tablet Commonly known as: CELEXA Take 20 mg by mouth daily.   cloNIDine 0.3 mg/24hr patch Commonly known as: CATAPRES - Dosed in mg/24 hr Place 0.3 mg onto the skin once a week.   clotrimazole-betamethasone cream Commonly known as: LOTRISONE Apply to affected area 2 times daily prn   cyclobenzaprine 5 MG tablet Commonly known as: FLEXERIL Take 5 mg by mouth 3 (three) times daily as needed.   diclofenac sodium 1 % Gel Commonly known as: VOLTAREN Apply 2-4 g topically 4 (four) times daily as needed. For pain.   dicyclomine 10 MG capsule Commonly known as: BENTYL Take 20 mg by mouth 2 (two) times daily before a meal.   famotidine 20 MG tablet Commonly known as: PEPCID Take 20 mg by mouth daily.   feeding supplement (GLUCERNA SHAKE) Liqd Take 237 mLs by mouth 4 (four) times daily.   ferrous sulfate 325 (65 FE) MG tablet Take 325 mg by mouth daily with breakfast.   finasteride 5 MG tablet Commonly known as: PROSCAR Take 5 mg by mouth daily.   fluticasone 110 MCG/ACT inhaler Commonly known as: FLOVENT HFA Inhale 2 puffs into the lungs 2 (two) times daily.   fluticasone 50 MCG/ACT nasal spray Commonly known as: FLONASE Place 2 sprays into both nostrils daily.   gabapentin 300 MG capsule Commonly known as: NEURONTIN Take 1 capsule (300 mg total) by mouth 2 (two) times daily. What changed: when to take this   glucose  4 GM chewable tablet Chew 1 tablet by mouth as needed for low blood sugar.   hydrALAZINE 50 MG tablet Commonly known as: APRESOLINE Take 1 tablet (50 mg total) by mouth 3 (three) times daily.   insulin degludec 100 UNIT/ML FlexTouch Pen Commonly known as: TRESIBA Inject 24 Units into the skin 2 (two) times daily. What changed: how much to take   ipratropium 0.06 % nasal spray Commonly known as: ATROVENT Place 2 sprays into both nostrils 4 (four) times daily.   isosorbide dinitrate 20 MG tablet Commonly known as: ISORDIL Take 1 tablet (20 mg total) by mouth 3 (three) times daily.   melatonin 5 MG Tabs Take 10 mg by mouth at bedtime as needed (sleep).   metFORMIN 500 MG 24 hr tablet Commonly known as: GLUCOPHAGE-XR Take 500 mg by mouth 2 (two) times daily with a meal.   metoprolol succinate 100 MG 24 hr tablet Commonly known as: TOPROL-XL Take 1 tablet (100 mg total) by mouth 2 (two) times daily. Take with or immediately following a meal.  mirtazapine 45 MG tablet Commonly known as: REMERON Take 45 mg by mouth at bedtime.   multivitamin tablet Take 1 tablet by mouth daily.   nitroGLYCERIN 0.4 MG SL tablet Commonly known as: NITROSTAT PLACE 1 TABLET UNDER THE TONGUE EVERY 5 MINUTES AS NEEDED FOR CHEST PAIN. CALL 911 IF YOU HAVE TAKEN 3 TABLETS FOR CHEST PAIN   nortriptyline 25 MG capsule Commonly known as: PAMELOR Take 50 mg by mouth at bedtime.   omega-3 acid ethyl esters 1 g capsule Commonly known as: LOVAZA Take 1 g by mouth 3 (three) times daily.   omeprazole 20 MG capsule Commonly known as: PRILOSEC Take 20 mg by mouth daily.   ondansetron 4 MG disintegrating tablet Commonly known as: ZOFRAN-ODT Take 4 mg by mouth every 8 (eight) hours as needed for nausea or vomiting.   polyethylene glycol 17 g packet Commonly known as: MIRALAX / GLYCOLAX Take 17 g by mouth 3 (three) times daily.   potassium chloride SA 20 MEQ tablet Commonly known as: KLOR-CON  M Take 1 tablet (20 mEq total) by mouth daily for 7 days.   prednisoLONE acetate 1 % ophthalmic suspension Commonly known as: PRED FORTE Place 1 drop into the right eye every 2 (two) hours while awake.   rosuvastatin 40 MG tablet Commonly known as: CRESTOR TAKE 1 TABLET(40 MG) BY MOUTH DAILY   solifenacin 10 MG tablet Commonly known as: VESICARE Take 10 mg by mouth daily.   T.E.D. Below Knee/Medium Misc 1 each by Does not apply route daily. Measure legs and give appropriate size.   Trulicity 4.5 MG/0.5ML Sopn Generic drug: Dulaglutide SMARTSIG:4.5 Milligram(s) SUB-Q Once a Week        Follow-up Information     Aycock, Ngwe A, MD. Schedule an appointment as soon as possible for a visit in 1 week(s).   Specialty: Family Medicine Contact information: [address] [place] [address] [phone]                 Subjective   Pt reports ***  All questions and concerns were addressed at time of discharge.  Objective  Blood pressure (!) 129/100, pulse 98, temperature 98.2 F (36.8 C), resp. rate 18, weight 66.5 kg, SpO2 99%.   General: Pt is alert, awake, not in acute distress Cardiovascular: RRR, S1/S2 +, no rubs, no gallops Respiratory: CTA bilaterally, no wheezing, no rhonchi Abdominal: Soft, NT, ND, bowel sounds + Extremities: no edema, no cyanosis  The results of significant diagnostics from this hospitalization (including imaging, microbiology, ancillary and laboratory) are listed below for reference.   Imaging studies: No results found.  Labs: Basic Metabolic Panel: Recent Labs  Lab 10/21/22 1800 10/22/22 0503  NA 135 140  K 2.4* 3.0*  CL 92* 102  CO2 28 26  GLUCOSE 137* 120*  BUN 16 12  CREATININE 1.35* 1.28*  CALCIUM 9.4 8.3*  MG 2.1  --    CBC: Recent Labs  Lab 10/21/22 1800 10/22/22 0503  WBC 8.3 8.1  HGB 13.7 12.6*  HCT 42.8 38.6*  MCV 85.9 84.1  PLT 277 260   Microbiology: ***  Time coordinating discharge:  Over 30 minutes  Leeroy Bock, MD  [facility] 10/22/2022, 12:47 PM

## 2022-10-22 NOTE — Assessment & Plan Note (Signed)
Cont on Byktarvy  and acyclovir.

## 2022-10-23 LAB — HEMOGLOBIN A1C
Hgb A1c MFr Bld: 8.1 % — ABNORMAL HIGH (ref 4.8–5.6)
Mean Plasma Glucose: 186 mg/dL

## 2022-10-24 LAB — URINE CULTURE: Culture: 20000 — AB

## 2022-11-20 ENCOUNTER — Ambulatory Visit (INDEPENDENT_AMBULATORY_CARE_PROVIDER_SITE_OTHER): Payer: Medicaid Other

## 2022-11-20 ENCOUNTER — Encounter: Payer: Self-pay | Admitting: *Deleted

## 2022-11-20 ENCOUNTER — Ambulatory Visit
Admission: EM | Admit: 2022-11-20 | Discharge: 2022-11-20 | Disposition: A | Discharge status: Home / Self Care | Payer: Medicaid Other | Attending: Family Medicine | Admitting: Family Medicine

## 2022-11-20 DIAGNOSIS — M25531 Pain in right wrist: Secondary | ICD-10-CM

## 2022-11-20 DIAGNOSIS — M19131 Post-traumatic osteoarthritis, right wrist: Secondary | ICD-10-CM | POA: Diagnosis not present

## 2022-11-20 NOTE — ED Provider Notes (Signed)
[facility]    CSN: [id number] Arrival date & time: 11/20/22  1558      History   Chief Complaint Chief Complaint  Patient presents with   Wrist Pain    HPI  HPI Cory Campbell is a 42 y.o. male.   Cory Campbell presents for right wrist pain that started a few hours ago. He was pushing himself up and heard a pop in his wrist then felt pain.  Has been getting injections in his wrist as well.  Continues to have pain with movement. He is right handed.     Past Medical History:  Diagnosis Date   (HFpEF) heart failure with preserved ejection fraction (HCC)    Acid reflux    Adopted    Anxiety    Arthritis    Arthrogryposis    Asthma    BPH (benign prostatic hyperplasia)    Bronchitis    Chronic pain syndrome    Depressive disorder, not elsewhere classified    Diabetes mellitus without complication (HCC)    Diverticulitis of colon (without mention of hemorrhage)(562.11)    ED (erectile dysfunction)    Herpes genitalis    HIV infection (HCC)    HTN (hypertension)    Hyperlipidemia    Hypertensive cardiomyopathy (HCC)    Hypogonadism in male    OAB (overactive bladder)    Other psoriasis    Seizure disorder (HCC)    Sleep apnea    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 or other cause, without mention of thyrotoxic crisis or storm    hyperthyroidism    Patient Active Problem List   Diagnosis Date Noted   Renal insufficiency 04/13/2022   End stage renal disease (HCC) 04/11/2022   Hyperlipidemia 04/11/2022   Hyperkalemia 03/02/2022   Hyponatremia 03/02/2022   Metabolic acidosis 03/02/2022   UTI (urinary tract infection) 11/24/2021   Proctitis 11/24/2021   Diabetes mellitus without complication (HCC) 11/24/2021   Hypokalemia 11/24/2021   Pancreatitis 11/24/2021   Asthma 11/24/2021   Depression with anxiety 11/24/2021   Overweight (BMI 25.0-29.9) 11/24/2021   BPH (benign prostatic hyperplasia) 11/24/2021   Chronic diastolic CHF (congestive heart  failure) (HCC) 11/24/2021   Elevated lactic acid level 11/24/2021   Pedal edema 09/12/2021   Bilateral foot pain 09/09/2021   Lactic acidosis 09/09/2021   Tobacco dependence 09/09/2021   Weakness    AKI (acute kidney injury) (HCC) 06/05/2021   Postural dizziness with presyncope 06/05/2021   Arthrogryposis, with contractures and functional paraplegia    Seizure disorder (HCC)    Headache    Acute asthma exacerbation 03/30/2020   Acute respiratory failure with hypoxia (HCC) 03/29/2020   Surgical wound, non healing    Acute colitis 05/20/2017   Colitis    Acute renal failure (HCC) 05/15/2017   Diarrhea 05/15/2017   Sepsis (HCC) 05/15/2017   Abdominal pain 11/25/2016   HIV (human immunodeficiency virus infection) (HCC) 11/19/2016   Hypotension 10/08/2016   Unstable angina (HCC) 10/06/2016   Shortness of breath 06/23/2015   Obesity 05/18/2014   Frequent falls 08/12/2013   Pain of left clavicle 08/12/2013   Tachycardia 01/21/2013   Chronic pain syndrome 08/12/2012   Sleep apnea 06/07/2012   Chest pain 06/20/2011   Hypertension associated with diabetes (HCC) 06/20/2011   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/20/2011   Diabetes mellitus (HCC) 06/20/2011   Asthma, chronic, unspecified asthma severity, with acute exacerbation 01/18/2008   Hyperlipidemia associated with type 2 diabetes mellitus (HCC) 08/28/2006   Essential  hypertension 06/27/2005    Past Surgical History:  Procedure Laterality Date   APPENDECTOMY     COLOSTOMY     CORNEAL TRANSPLANT     DIALYSIS/PERMA CATHETER INSERTION Right 03/10/2022   Procedure: DIALYSIS/PERMA CATHETER INSERTION;  Surgeon: Renford Dills, MD;  Location: [facility];  Service: Cardiovascular;  Laterality: Right;   DIALYSIS/PERMA CATHETER REMOVAL N/A 04/18/2022   Procedure: DIALYSIS/PERMA CATHETER REMOVAL;  Surgeon: Renford Dills, MD;  Location: [facility];  Service: Cardiovascular;  Laterality: N/A;    feet surgery     both   HAND SURGERY     left and right   leg surgery     left and right   ORTHOPEDIC SURGERY     hands, feet, knees, legs   tubes in ears     both       Home Medications    Prior to Admission medications   Medication Sig Start Date End Date Taking? Authorizing Provider  acyclovir (ZOVIRAX) 200 MG capsule Take 1 capsule (200 mg total) by mouth 2 (two) times daily. 03/16/22   Willeen Niece, MD  albuterol (PROVENTIL HFA;VENTOLIN HFA) 108 (90 BASE) MCG/ACT inhaler Inhale 2 puffs into the lungs every 4 (four) hours as needed for wheezing or shortness of breath.     [provider]  albuterol (PROVENTIL) (2.5 MG/3ML) 0.083% nebulizer solution Take 2.5 mg by nebulization every 4 (four) hours as needed for wheezing or shortness of breath.    [provider]  amLODipine (NORVASC) 10 MG tablet Take 10 mg by mouth daily.    [provider]  BIKTARVY 50-200-25 MG TABS tablet Take 1 tablet by mouth daily. 11/21/16   [provider]  buprenorphine (BUTRANS) 7.5 MCG/HR Place 7.5 mg onto the skin once a week. Saturday    [provider]  buPROPion (WELLBUTRIN XL) 150 MG 24 hr tablet Take 150 mg by mouth daily. 08/11/21   [provider]  CABENUVA 600 & 900 MG/3ML injection INJECT A TOTAL OF 6 ML IN THE MUSCLE ONCE A MONTH FOR 2 MONTHS THEN USE EVERY 2 MONTHS THEREAFTER. 06/07/22   [provider]  cetirizine (ZYRTEC) 10 MG tablet Take 10 mg by mouth daily.    [provider]  citalopram (CELEXA) 20 MG tablet Take 20 mg by mouth daily.    [provider]  cloNIDine (CATAPRES - DOSED IN MG/24 HR) 0.3 mg/24hr patch Place 0.3 mg onto the skin once a week. 05/04/22 05/04/23  [provider]  clotrimazole-betamethasone (LOTRISONE) cream Apply to affected area 2 times daily prn 10/07/22   Lorre Munroe, NP  cyclobenzaprine (FLEXERIL) 5 MG tablet Take 5 mg by mouth 3 (three) times daily as needed. 07/11/22    [provider]  diclofenac sodium (VOLTAREN) 1 % GEL Apply 2-4 g topically 4 (four) times daily as needed. For pain.    [provider]  dicyclomine (BENTYL) 10 MG capsule Take 20 mg by mouth 2 (two) times daily before a meal. 05/01/21   [provider]  Elastic Bandages & Supports (T.E.D. BELOW KNEE/MEDIUM) MISC 1 each by Does not apply route daily. Measure legs and give appropriate size. 09/12/21   Calvert Cantor, MD  famotidine (PEPCID) 20 MG tablet Take 20 mg by mouth daily.  01/03/16   [provider]  feeding supplement, GLUCERNA SHAKE, (GLUCERNA SHAKE) LIQD Take 237 mLs by mouth 4 (four) times daily.    [provider]  ferrous sulfate  325 (65 FE) MG tablet Take 325 mg by mouth daily with breakfast.    [provider]  finasteride (PROSCAR) 5 MG tablet Take 5 mg by mouth daily.    [provider]  fluticasone (FLONASE) 50 MCG/ACT nasal spray Place 2 sprays into both nostrils daily. 07/04/22   Domenick Gong, MD  fluticasone (FLOVENT HFA) 110 MCG/ACT inhaler Inhale 2 puffs into the lungs 2 (two) times daily.    [provider]  gabapentin (NEURONTIN) 300 MG capsule Take 1 capsule (300 mg total) by mouth 2 (two) times daily. Patient taking differently: Take 300 mg by mouth 3 (three) times daily. 09/12/21 07/24/22  Calvert Cantor, MD  glucose 4 GM chewable tablet Chew 1 tablet by mouth as needed for low blood sugar.    [provider]  hydrALAZINE (APRESOLINE) 50 MG tablet Take 1 tablet (50 mg total) by mouth 3 (three) times daily. 03/16/22   Willeen Niece, MD  insulin degludec (TRESIBA) 100 UNIT/ML FlexTouch Pen Inject 24 Units into the skin 2 (two) times daily. 10/22/22   Leeroy Bock, MD  ipratropium (ATROVENT) 0.06 % nasal spray Place 2 sprays into both nostrils 4 (four) times daily. 08/29/21   Becky Augusta, NP  isosorbide dinitrate (ISORDIL) 20 MG tablet Take 1 tablet (20 mg total) by mouth 3 (three) times  daily. 03/16/22   Willeen Niece, MD  Melatonin 5 MG TABS Take 10 mg by mouth at bedtime as needed (sleep).    [provider]  metFORMIN (GLUCOPHAGE-XR) 500 MG 24 hr tablet Take 500 mg by mouth 2 (two) times daily with a meal. 06/23/22 06/23/23  [provider]  metoprolol succinate (TOPROL-XL) 100 MG 24 hr tablet Take 1 tablet (100 mg total) by mouth 2 (two) times daily. Take with or immediately following a meal. 07/24/22 10/22/22  Antonieta Iba, MD  mirtazapine (REMERON) 45 MG tablet Take 45 mg by mouth at bedtime.    [provider]  Multiple Vitamin (MULTIVITAMIN) tablet Take 1 tablet by mouth daily.    [provider]  nitroGLYCERIN (NITROSTAT) 0.4 MG SL tablet PLACE 1 TABLET UNDER THE TONGUE EVERY 5 MINUTES AS NEEDED FOR CHEST PAIN. CALL 911 IF YOU HAVE TAKEN 3 TABLETS FOR CHEST PAIN 08/03/22   Antonieta Iba, MD  nortriptyline (PAMELOR) 25 MG capsule Take 50 mg by mouth at bedtime.    [provider]  omega-3 acid ethyl esters (LOVAZA) 1 g capsule Take 1 g by mouth 3 (three) times daily.    [provider]  omeprazole (PRILOSEC) 20 MG capsule Take 20 mg by mouth daily. 09/08/21   [provider]  ondansetron (ZOFRAN-ODT) 4 MG disintegrating tablet Take 4 mg by mouth every 8 (eight) hours as needed for nausea or vomiting.    [provider]  polyethylene glycol (MIRALAX / GLYCOLAX) 17 g packet Take 17 g by mouth 3 (three) times daily.    [provider]  potassium chloride SA (KLOR-CON M) 20 MEQ tablet Take 1 tablet (20 mEq total) by mouth daily for 7 days. 10/22/22 10/29/22  Leeroy Bock, MD  prednisoLONE acetate (PRED FORTE) 1 % ophthalmic suspension Place 1 drop into the right eye every 2 (two) hours while awake. 06/21/22   [provider]  rosuvastatin (CRESTOR) 40 MG tablet TAKE 1 TABLET(40 MG) BY MOUTH DAILY 04/30/19   Antonieta Iba, MD  solifenacin (VESICARE) 10 MG tablet Take 10 mg by mouth  daily.    [provider]  Spacer/Aero-Holding Chambers (AEROCHAMBER PLUS) inhaler Use as instructed 03/23/18   Domenick Gong, MD  TRULICITY 4.5 MG/0.5ML SOPN SMARTSIG:4.5 Milligram(s) SUB-Q Once a Week 09/23/21   [provider]    Family History Family History  Adopted: Yes  Family history unknown: Yes    Social History Social History   Tobacco Use   Smoking status: Former    Types: Pipe, Software engineer, E-cigarettes    Quit date: 01/2018    Years since quitting: 4.8   Smokeless tobacco: Never   Tobacco comments:    Declines patch  Vaping Use   Vaping status: Former   Substances: Nicotine, Flavoring  Substance Use Topics   Alcohol use: Not Currently    Alcohol/week: 3.0 standard drinks of alcohol    Types: 3 Cans of beer per week   Drug use: No     Allergies   Penicillins and Erythromycin base   Review of Systems Review of Systems: :negative unless otherwise stated in HPI.      Physical Exam Triage Vital Signs ED Triage Vitals  Encounter Vitals Group     BP 11/20/22 1619 (!) 122/91     Systolic BP Percentile --      Diastolic BP Percentile --      Pulse Rate 11/20/22 1619 90     Resp 11/20/22 1619 20     Temp 11/20/22 1619 97.9 F (36.6 C)     Temp Source 11/20/22 1619 Oral     SpO2 11/20/22 1619 100 %     Weight 11/20/22 1618 143 lb (64.9 kg)     Height --      Head Circumference --      Peak Flow --      Pain Score 11/20/22 1617 8     Pain Loc --      Pain Education --      Exclude from Growth Chart --    No data found.  Updated Vital Signs BP (!) 122/91 (BP Location: Left Arm)   Pulse 90   Temp 97.9 F (36.6 C) (Oral)   Resp 20   Wt 64.9 kg   SpO2 100%   BMI 23.08 kg/m   Visual Acuity Right Eye Distance:   Left Eye Distance:   Bilateral Distance:    Right Eye Near:   Left Eye Near:    Bilateral Near:     Physical Exam GEN: well appearing male in no acute distress  CVS: well perfused  RESP: speaking in full  sentences without pause, no respiratory distress  MSK: right hand: Inspection:shortened fingers on bilateral hands (birth defects per pt). + swelling but this is not new,  has some erythema to dorsal proximal wrist Palpation: distal thenar oriented carpal tenderness extending midline ROM: Full ROM of the digits and decreased adduction of right wrist .  No swelling in PIP, DIP joints b/l. Flexor digitorum profundus and superficialis tendon functions are intact.  Present PIP joint collateral ligaments are stable  Strength: 4/5 strength in the forearm, wrist and interosseus muscles  Neurovascular: NV intact b/l    UC Treatments / Results  Labs (all labs ordered are listed, but only abnormal results are displayed) Labs Reviewed - No data to display  EKG   Radiology DG Wrist Complete Right  Result Date: 11/20/2022 CLINICAL DATA:  Wrist pain EXAM: RIGHT WRIST - COMPLETE 3+ VIEW COMPARISON:  None Available. FINDINGS: No acute fracture or dislocation is seen. Moderate arthritis at the radiocarpal joint. Widened scapholunate  interval with mild proximal migration of the capitate. Possible chronic deformity at the ulnar styloid. IMPRESSION: 1. No acute osseous abnormality. 2. Moderate arthritis at the radial carpal joint pain involving both the scaphoid and lunate articulation with joint space narrowing and sclerosis. Widened appearing scapholunate interval with slight proximal migration of the capitate, constellation of findings suggest developing SLAC wrist. Electronically Signed   By: Jasmine Pang M.D.   On: 11/20/2022 18:25     Procedures Procedures (including critical care time)  Medications Ordered in UC Medications - No data to display  Initial Impression / Assessment and Plan / UC Course  I have reviewed the triage vital signs and the nursing notes.  Pertinent labs & imaging results that were available during my care of the patient were reviewed by me and considered in my medical  decision making (see chart for details).      Pt is a 42 y.o.  male with acute right wrist pain after pushing myself up in his wheelchair a few hours prior to arrival. Has proximal dorsal wrist erythema and thenar carpal tenderness extending midline. Appears to have ganglionic cyst here. Obtained right wrist plain films.  Personally interpreted by me were unremarkable for fracture or dislocation. Radiologist report reviewed and additionally notes scapholunate advanced collapse (SLAC) due to the widening appearing of the scapholunate interval.  On chart review, he has history of the same.  Follows with [facility] orthopedics.  He is unable to find his wrist brace.  Provided with right wrist brace prior to discharge.  Patient to gradually return to normal activities, as tolerated and continue ordinary activities within the limits permitted by pain.  Continue chronic pain patches.  Tylenol, lidocaine patches and Voltaren gel PRN.    Patient to follow up with orthopedic provider for further evaluation and treatment.  Return and ED precautions given. Understanding voiced. Discussed MDM, treatment plan and plan for follow-up with patient who agrees with plan.   Final Clinical Impressions(s) / UC Diagnoses   Final diagnoses:  Right wrist pain  Scapholunate advanced collapse of right wrist     Discharge Instructions      Follow up with [facility] to discuss next steps in your wrist pain.  Take Tylenol 1000 mg three times a day for pain.  Wear the wrist brace even to bed.  Apply some ice to the area every 1-2 hours for 15-20 minutes.  Apply Voltaren gel and/or lidocaine patch for additional relief.      ED Prescriptions   None    PDMP not reviewed this encounter.   Katha Cabal, DO 11/20/22 2054

## 2022-11-20 NOTE — ED Triage Notes (Signed)
Patient states right wrist pain that started about 3 hours ago, trying to pull himself up on bed and heard a pop.

## 2022-11-20 NOTE — Discharge Instructions (Addendum)
Follow up with [facility] to discuss next steps in your wrist pain.  Take Tylenol 1000 mg three times a day for pain.  Wear the wrist brace even to bed.  Apply some ice to the area every 1-2 hours for 15-20 minutes.  Apply Voltaren gel and/or lidocaine patch for additional relief.

## 2022-11-25 ENCOUNTER — Emergency Department: Payer: Medicaid Other

## 2022-11-25 ENCOUNTER — Encounter: Payer: Self-pay | Admitting: Emergency Medicine

## 2022-11-25 ENCOUNTER — Other Ambulatory Visit: Payer: Self-pay

## 2022-11-25 DIAGNOSIS — M19031 Primary osteoarthritis, right wrist: Secondary | ICD-10-CM | POA: Diagnosis not present

## 2022-11-25 DIAGNOSIS — L02413 Cutaneous abscess of right upper limb: Secondary | ICD-10-CM | POA: Insufficient documentation

## 2022-11-25 DIAGNOSIS — M79641 Pain in right hand: Secondary | ICD-10-CM | POA: Diagnosis present

## 2022-11-25 NOTE — ED Notes (Signed)
This RN cut and removed wrist brace, and underneath was a Lidoderm patch present x 4 days. Small wound with purulent drainage noted. Wrist warm and red, dressing with saline gauze and kerlix applied

## 2022-11-25 NOTE — ED Triage Notes (Signed)
Pt in with pain and swelling to R wrist and hand, had a fall on 10/14 and injured the extremity. Went to [facility] on 10/14 and was placed in a brace, and given an ortho consult. In the days after, swelling has increased so much he can no longer get the brace off.

## 2022-11-26 ENCOUNTER — Emergency Department
Admission: EM | Admit: 2022-11-26 | Discharge: 2022-11-26 | Disposition: A | Discharge status: Home / Self Care | Payer: Medicaid Other | Attending: Emergency Medicine | Admitting: Emergency Medicine

## 2022-11-26 DIAGNOSIS — L02413 Cutaneous abscess of right upper limb: Secondary | ICD-10-CM

## 2022-11-26 DIAGNOSIS — M19131 Post-traumatic osteoarthritis, right wrist: Secondary | ICD-10-CM

## 2022-11-26 MED ORDER — SULFAMETHOXAZOLE-TRIMETHOPRIM 800-160 MG PO TABS: 1.0000 | ORAL_TABLET | Freq: Two times a day (BID) | ORAL | 0 refills | Status: DC

## 2022-11-26 MED ORDER — SULFAMETHOXAZOLE-TRIMETHOPRIM 800-160 MG PO TABS
1.0000 | ORAL_TABLET | Freq: Once | ORAL | Status: AC
  Administered 2022-11-26: 1 via ORAL
  Filled 2022-11-26: qty 1

## 2022-11-26 MED ORDER — LIDOCAINE HCL (PF) 1 % IJ SOLN
10.0000 mL | Freq: Once | INTRAMUSCULAR | Status: AC
  Administered 2022-11-26: 10 mL
  Filled 2022-11-26: qty 10

## 2022-11-26 NOTE — ED Notes (Signed)
Discharge instructions reviewed.   Opportunity for questions and concerns provided.   Newly prescribed medications discussed. Pharmacy verified.   Transport to home residence notified.

## 2022-11-26 NOTE — ED Notes (Signed)
Wound care provided to right wrist.

## 2022-11-26 NOTE — Discharge Instructions (Addendum)
Please follow-up with your orthopedic surgeon at [facility] at the next available opportunity (preferably Monday).  Continue taking your regular medications but also take the newly prescribed antibiotic for the abscess you have developed on the back of your right wrist.  We drained it and please leave the packing in place and keep the wound clean and dry until you follow-up with your doctor on Monday or early this coming week.    Return to the emergency department if you develop new or worsening symptoms that concern you.

## 2022-11-26 NOTE — ED Provider Notes (Signed)
[facility] Provider Note    Event Date/Time   First MD Initiated Contact with Patient 11/26/22 0100     (approximate)   History   Hand Pain   HPI Cory Campbell is a 42 y.o. male well-known to the emergency department with numerous chronic medical conditions as documented elsewhere in his note.  He presents tonight for swelling to his right wrist and hand.  He had a fall about 6 days ago which exacerbated a chronic injury for which he sees an Investment banker, operational at [facility].  He went to the [facility] urgent care who placed him in a splint and recommended he return to his orthopedic surgeon at [facility].  He said he was planning to do so on Monday but in the meantime the swelling has increased and he could no longer get the brace off.  He has no other symptoms except that while the brace was on his fingers were tingling.  Now that we removed it in triage, it feels better.  However after the brace was removed, he discovered that he had some swelling and drainage from a wound on the back of his right wrist that it was not there 6 days ago when he fell and went to the urgent care.     Physical Exam   Triage Vital Signs: ED Triage Vitals [11/25/22 2226]  Encounter Vitals Group     BP (!) 166/124     Systolic BP Percentile      Diastolic BP Percentile      Pulse Rate 93     Resp 20     Temp 98.2 F (36.8 C)     Temp Source Oral     SpO2 100 %     Weight 64.9 kg (143 lb)     Height      Head Circumference      Peak Flow      Pain Score 10     Pain Loc      Pain Education      Exclude from Growth Chart     Most recent vital signs: Vitals:   11/25/22 2226  BP: (!) 166/124  Pulse: 93  Resp: 20  Temp: 98.2 F (36.8 C)  SpO2: 100%    General: Awake, no distress.  Chronically debilitated but not acutely ill or toxic. CV:  Good peripheral perfusion.  Regular rate and rhythm. Resp:  Normal effort. Speaking easily and comfortably, no accessory muscle usage nor  intercostal retractions.   Abd:  No distention.  Other:  Cutaneous abscess measuring 3.5-cm on the dorsum of his right wrist, with a small hole draining pus.    ED Results / Procedures / Treatments   Labs (all labs ordered are listed, but only abnormal results are displayed) Labs Reviewed  AEROBIC/ANAEROBIC CULTURE W GRAM STAIN (SURGICAL/DEEP WOUND)     RADIOLOGY I viewed and interpreted the patient's right wrist x-rays.  No acute abnormality such as fractures or dislocations.  Radiologist mention chronic widening of spaces consistent with his prior diagnosis.   PROCEDURES:  Critical Care performed: No  ..Incision and Drainage  Date/Time: 11/26/2022 5:34 AM  Performed by: Loleta Rose, MD Authorized by: Loleta Rose, MD   Consent:    Consent obtained:  Verbal   Consent given by:  Patient   Risks discussed:  Bleeding, incomplete drainage and infection   Alternatives discussed:  No treatment and alternative treatment Universal protocol:    Patient identity confirmed:  Arm band  and verbally with patient Location:    Type:  Abscess   Size:  3.5-cm   Location:  Upper extremity   Upper extremity location: dorsum of right wrist. Pre-procedure details:    Skin preparation:  Povidone-iodine Sedation:    Sedation type:  None Anesthesia:    Anesthesia method:  Local infiltration   Local anesthetic:  Lidocaine 1% w/o epi Procedure type:    Complexity:  Complex Procedure details:    Incision types:  Single straight   Wound management:  Probed and deloculated and extensive cleaning   Drainage:  Bloody and purulent   Drainage amount:  Copious   Wound treatment:  Wound left open   Packing materials:  1/4 in iodoform gauze Post-procedure details:    Procedure completion:  Tolerated well, no immediate complications     IMPRESSION / MDM / ASSESSMENT AND PLAN / ED COURSE  I reviewed the triage vital signs and the nursing notes.                               Differential diagnosis includes, but is not limited to, acute fracture, dislocation, chronic scapholunate advanced collapse (SLAC).  Patient's presentation is most consistent with exacerbation of chronic illness.  Labs/studies ordered: Right wrist x-rays, wound culture  Interventions/Medications given:  Medications  lidocaine (PF) (XYLOCAINE) 1 % injection 10 mL (has no administration in time range)  sulfamethoxazole-trimethoprim (BACTRIM DS) 800-160 MG per tablet 1 tablet (1 tablet Oral Given 11/26/22 0412)    (Note:  hospital course my include additional interventions and/or labs/studies not listed above.)   Patient has chronic scapholunate advanced collapse (SLAC) and has been wearing a brace on his right wrist, appropriately so.  However it seems to have rubbed his skin raw and has developed a cutaneous abscess.  I performed an I&D and have drained a copious amount of purulence.  The abscess is packed and I started him on Bactrim DS.  He continues to reiterate that he will see his orthopedic surgeon on Monday and I told him that is appropriate and to hold off on replacing the brace over the wound for now.  He is comfortable with that plan.  I gave my usual and customary follow-up recommendations and return precautions.         FINAL CLINICAL IMPRESSION(S) / ED DIAGNOSES   Final diagnoses:  Scapholunate advanced collapse of right wrist  Cutaneous abscess of right upper extremity     Rx / DC Orders   ED Discharge Orders          Ordered    sulfamethoxazole-trimethoprim (BACTRIM DS) 800-160 MG tablet  2 times daily        11/26/22 0537             Note:  This document was prepared using Dragon voice recognition software and may include unintentional dictation errors.   Loleta Rose, MD 11/26/22 [phone]

## 2022-12-01 LAB — AEROBIC/ANAEROBIC CULTURE W GRAM STAIN (SURGICAL/DEEP WOUND)

## 2023-01-22 NOTE — Progress Notes (Deleted)
Date:  01/22/2023   ID:  [name] Schwebach, DOB 12/17/1980, MRN [id number]  Patient Location:  [address] [place] [address]   Provider location:   [facility], [place] office  PCP:  Emogene Morgan, MD  Cardiologist:  [facility]  No chief complaint on file.   History of Present Illness:    Cory Campbell is a 42 y.o. male has a past medical history of Arthrogryposis multiplex congenita,  chronic pain in his legs,  asthma,  hypertension,  diabetes,  severe obstructive sleep apnea on CPAP since April 2012,  obesity,  seizure disorder,  psoriasis with several  evaluations in the emergency room for chest pain on April 23, 06/01/2011.  frequent falls, walks with leg braces and canes atypical chest pain, often exacerbated by falls Ostomy He presents for routine followup of his hypertension and chest pain, controlled diabetes  Last seen by myself in clinic June 2024  Seen by one of our providers March 2024 Reports no longer working at [employer] secondary to renal failure Blood pressure stable  Blood pressure elevated on today's visit, also tachycardic rate 110 sinus tach Reports he took his metoprolol tartrate 75 mg this morning  Lab work reviewed A1C up, 8.2, off jardiance Wife reports he is drinking beverages he is not supposed to He reports that he drinks mainly diet sodas Total chol 96, LDL 22  Prior history of postural hypotension ED on 06/05/2021 with dizziness, presyncope x several hours. This was associated with a headache. In the ED, it was found that they had hypotension as low as 83/62  Followed by nephrology at [facility] Creatinine 1.13 BUN 19 down from creatinine 1.86 BUN 35 in February 2024  Married with 18-year-old boy No recent falls  EKG personally reviewed by myself on todays visit Sinus tachycardia rate 110 bpm no significant ST-T wave changes  Of past medical history reviewed  emergency room May 05, 2018 Urinary tract infection Bilateral low back pain Treated with Pyridium, antibiotics, Diflucan  Fall April 13, 2018 shoulder injury, seen in the ER shoulder pain Shoulder is doing better  Seen in the ER January 17, 2018 diarrhea abdominal pain Symptoms better  ostomy repair later in March 2019 at [facility]   Past Medical History:  Diagnosis Date   (HFpEF) heart failure with preserved ejection fraction (HCC)    Acid reflux    Adopted    Anxiety    Arthritis    Arthrogryposis    Asthma    BPH (benign prostatic hyperplasia)    Bronchitis    Chronic pain syndrome    Depressive disorder, not elsewhere classified    Diabetes mellitus without complication (HCC)    Diverticulitis of colon (without mention of hemorrhage)(562.11)    ED (erectile dysfunction)    Herpes genitalis    HIV infection (HCC)    HTN (hypertension)    Hyperlipidemia    Hypertensive cardiomyopathy (HCC)    Hypogonadism in male    OAB (overactive bladder)    Other psoriasis    Seizure disorder (HCC)    Sleep apnea    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 or other cause, without mention of thyrotoxic crisis or storm    hyperthyroidism   Past Surgical History:  Procedure Laterality Date   APPENDECTOMY     COLOSTOMY     CORNEAL TRANSPLANT     DIALYSIS/PERMA CATHETER INSERTION Right 03/10/2022   Procedure: DIALYSIS/PERMA CATHETER INSERTION;  Surgeon: Renford Dills, MD;  Location: [facility] INVASIVE CV LAB;  Service: Cardiovascular;  Laterality: Right;   DIALYSIS/PERMA CATHETER REMOVAL N/A 04/18/2022   Procedure: DIALYSIS/PERMA CATHETER REMOVAL;  Surgeon: Renford Dills, MD;  Location: [facility] INVASIVE CV LAB;  Service: Cardiovascular;  Laterality: N/A;   feet surgery     both   HAND SURGERY     left and right   leg surgery     left and right   ORTHOPEDIC SURGERY     hands, feet, knees, legs   tubes in ears     both     No outpatient medications have been marked as taking for the 01/23/23 encounter  (Appointment) with Antonieta Iba, MD.     Allergies:   Penicillins and Erythromycin base   Social History   Tobacco Use   Smoking status: Former    Types: Pipe, Software engineer, E-cigarettes    Quit date: 01/2018    Years since quitting: 5.0   Smokeless tobacco: Never   Tobacco comments:    Declines patch  Vaping Use   Vaping status: Former   Substances: Nicotine, Flavoring  Substance Use Topics   Alcohol use: Not Currently    Alcohol/week: 3.0 standard drinks of alcohol    Types: 3 Cans of beer per week   Drug use: No     Family Hx: The patient's He was adopted. Family history is unknown by patient.  ROS:   Please see the history of present illness.    Review of Systems  Constitutional: Negative.   Respiratory: Negative.    Cardiovascular: Negative.   Gastrointestinal: Negative.   Musculoskeletal: Negative.        Falls, leg weakness  Neurological: Negative.   Psychiatric/Behavioral: Negative.    All other systems reviewed and are negative.    Labs/Other Tests and Data Reviewed:    Recent Labs: 10/21/2022: Magnesium 2.1 10/22/2022: ALT 14; BUN 12; Creatinine, Ser 1.28; Hemoglobin 12.6; Platelets 260; Potassium 3.0; Sodium 140   Recent Lipid Panel Lab Results  Component Value Date/Time   CHOL 163 10/07/2016 05:03 AM   TRIG 311 (H) 11/24/2021 07:17 AM   HDL 26 (L) 10/07/2016 05:03 AM   CHOLHDL 6.3 10/07/2016 05:03 AM   LDLCALC 112 (H) 10/07/2016 05:03 AM    Wt Readings from Last 3 Encounters:  11/25/22 143 lb (64.9 kg)  11/20/22 143 lb (64.9 kg)  10/22/22 146 lb 9.7 oz (66.5 kg)     Exam:    Vital Signs: Vital signs may also be detailed in the HPI There were no vitals taken for this visit.  Constitutional:  oriented to person, place, and time. No distress.  Presenting in a wheelchair scooter HENT:  Head: Grossly normal Eyes:  no discharge. No scleral icterus.  Neck: No JVD, no carotid bruits  Cardiovascular: Tachycardic, regular, no murmurs  appreciated Pulmonary/Chest: Clear to auscultation bilaterally, no wheezes or rails Abdominal: Soft.  no distension.  no tenderness.  Musculoskeletal: Normal range of motion Neurological:  normal muscle tone. Coordination normal. No atrophy Skin: Skin warm and dry Psychiatric: normal affect, pleasant   ASSESSMENT & PLAN:    Type 2 diabetes mellitus with complication, without long-term current use of insulin (HCC) A1c trending higher, diet discussed with him Reports he is followed by endocrine  Chest pain, unspecified type Previously healthy musculoskeletal chest wall pain Denies significant chest pain on today's visit  Frequent falls Denies any recent falls Long history of falls, injury Wheelchair-bound, muscle atrophy lower  extremities  Essential hypertension Long history difficult to control hypertension Blood pressure elevated today and tachycardic Recommend he change his metoprolol to tartrate 75 twice daily over to metoprolol succinate 100 twice daily He reports compliance with his medications, wife helps  Obstructive sleep apnea on CPAP He reports compliance with his CPAP  Hyperlipidemia On Crestor, Cholesterol at goal  Acute renal failure Noted in February creatinine 1.8, appears to have resolved   Total encounter time more than 40 minutes  Greater than 50% was spent in counseling and coordination of care with the patient   Signed, Julien Nordmann, MD  01/22/2023 8:07 AM    [facility] [address], [place], [place] [address]

## 2023-01-23 ENCOUNTER — Ambulatory Visit: Payer: Medicaid Other | Admitting: Cardiovascular Disease

## 2023-01-28 NOTE — Progress Notes (Unsigned)
Cardiology Clinic Note   Date: 01/29/2023 ID: YVAN JUHNKE, DOB 1980-04-22, MRN [id number]  Primary Cardiologist:  Julien Nordmann, MD  Patient Profile    Cory Campbell is a 42 y.o. male who presents to the clinic today for routine follow up.     Past medical history significant for: Chronic diastolic heart failure. Echo 10/08/2016: EF 50 to 55%.  Distal septal apical hypokinesis.  No significant valvular abnormalities. Palpitations. 48-hour Holter 02/02/2017: Normal sinus rhythm with rare PVCs. Hypertension. Hyperlipidemia. Asthma. OSA. On CPAP. Colitis. Pancreatitis. HIV. IDDM. End-stage renal disease. Not on HD.   In summary, patient was initially evaluated by Dr. Mariah Milling in May 2013 for chest pain after several ED evaluations.  He underwent stress testing significant GI uptake artifact, no evidence of ischemia, EF 56%, low risk stress test.  Echo September 2018 showed normal LV function as above.  48-hour Holter monitor in December 2018 showed normal sinus rhythm with rare PVCs.  Patient underwent hospital admission in January 2024 and was started on dialysis for AKI.  He followed-up in the office March 2024 and reported doing well.  Repeat echo was offered but patient declined at that time.     History of Present Illness    Cory Campbell is followed by Dr. Mariah Milling for the above outlined history.  Patient was last seen in the office by Dr. Mariah Milling on 07/24/2022 for routine follow-up.  He was doing well at that time and no medication changes were made.  Today, patient is here alone. He denies shortness of breath, dyspnea on exertion, lower extremity edema, orthopnea or PND. No chest pain, pressure, or tightness. He reports occasional palpitations described as hard beats typically at night. They are not particularly bothersome to him. He does not have any associated symptoms when they occur. He is mostly wheelchair bound. He is able to walk around with crutches. He has no  complaints today. Sugars have been running slightly high but he feels he is getting a cold, as his young son and now his wife have been sick with a URI.     ROS: All other systems reviewed and are otherwise negative except as noted in History of Present Illness.  Studies Reviewed    EKG Interpretation Date/Time:  Monday January 29 2023 14:35:02 EST Ventricular Rate:  90 PR Interval:  88 QRS Duration:  124 QT Interval:  380 QTC Calculation: 464 R Axis:   4  Text Interpretation: Sinus rhythm Left ventricular hypertrophy LBBB Septal infarct , age undetermined Cannot rule out Inferior infarct , age undetermined When compared with ECG of 02-Mar-2022 12:58, PREVIOUS ECG IS PRESENT Confirmed by Carlos Levering [phone]) on 01/29/2023 2:53:49 PM       Physical Exam    VS:  BP 131/88 (BP Location: Left Arm, Patient Position: Sitting, Cuff Size: Normal)   Pulse 90   Ht 5\' 6"  (1.676 m)   Wt 141 lb (64 kg)   SpO2 97%   BMI 22.76 kg/m  , BMI Body mass index is 22.76 kg/m.  GEN: Well nourished, well developed, in no acute distress. Neck: No JVD or carotid bruits. Cardiac:  RRR. No murmurs. No rubs or gallops.   Respiratory:  Respirations regular and unlabored. Clear to auscultation without rales, wheezing or rhonchi. GI: Soft, nontender, nondistended. Extremities: Radials/DP/PT 2+ and equal bilaterally. No clubbing or cyanosis. No edema.  Skin: Warm and dry, no rash. Neuro: Strength intact.  Assessment & Plan   Chronic diastolic heart failure  Echo September 2018 showed normal LV/RV function with no significant valvular abnormalities.  Patient denies lower extremity edema, shortness of breath, DOE, orthopnea or PND. Euvolemic and well compensated on exam. -Continue Toprol, isosorbide dinitrate, hydralazine.  Palpitations Patient reports palpitations described as hard beats that typically occur at night with no other associated symptoms. They are not bothersome to him.  -He is  instructed to contact the office if they become more frequent or sustained.   Hypertension BP today 131/88. PCP recently stopped amlodipine and reduced hydralazine secondary to lower BP. He has been doing well since those changes were made. He denies headaches or dizziness.  -Continue clonidine, hydralazine, isosorbide dinitrate, Toprol.  Disposition: Return in 6 months or sooner as needed.          Signed, Etta Grandchild. Cory Province, DNP, NP-C

## 2023-01-29 ENCOUNTER — Ambulatory Visit: Payer: Medicaid Other | Attending: Student | Admitting: Student

## 2023-01-29 ENCOUNTER — Encounter: Payer: Self-pay | Admitting: Student

## 2023-01-29 VITALS — BP 131/88 | HR 90 | Ht 66.0 in | Wt 141.0 lb

## 2023-01-29 DIAGNOSIS — R002 Palpitations: Secondary | ICD-10-CM | POA: Insufficient documentation

## 2023-01-29 DIAGNOSIS — I10 Essential (primary) hypertension: Secondary | ICD-10-CM | POA: Insufficient documentation

## 2023-01-29 DIAGNOSIS — I5032 Chronic diastolic (congestive) heart failure: Secondary | ICD-10-CM | POA: Diagnosis not present

## 2023-01-29 NOTE — Patient Instructions (Addendum)
Medication Instructions:  Your physician recommends that you continue on your current medications as directed. Please refer to the Current Medication list given to you today.   *If you need a refill on your cardiac medications before your next appointment, please call your pharmacy*   Lab Work: No labs ordered today    Testing/Procedures: No test ordered today    Follow-Up: At [facility], you and your health needs are our priority.  As part of our continuing mission to provide you with exceptional heart care, we have created designated Provider Care Teams.  These Care Teams include your primary Cardiologist (physician) and Advanced Practice Providers (APPs -  Physician Assistants and Nurse Practitioners) who all work together to provide you with the care you need, when you need it.  We recommend signing up for the patient portal called "MyChart".  Sign up information is provided on this After Visit Summary.  MyChart is used to connect with patients for Virtual Visits (Telemedicine).  Patients are able to view lab/test results, encounter notes, upcoming appointments, etc.  Non-urgent messages can be sent to your provider as well.   To learn more about what you can do with MyChart, go to [email or web address].    Your next appointment:   6 month(s)  Provider:   You may see Julien Nordmann, MD or one of the following Advanced Practice Providers on your designated Care Team:   Nicolasa Ducking, NP Eula Listen, PA-C Cadence Fransico Michael, PA-C Charlsie Quest, NP Carlos Levering, NP

## 2023-04-29 ENCOUNTER — Other Ambulatory Visit: Payer: Self-pay | Admitting: Cardiovascular Disease

## 2023-06-03 ENCOUNTER — Encounter: Payer: Self-pay | Admitting: Emergency Medicine

## 2023-06-03 ENCOUNTER — Ambulatory Visit
Admission: EM | Admit: 2023-06-03 | Discharge: 2023-06-03 | Disposition: A | Discharge status: Home / Self Care | Attending: Physician Assistant | Admitting: Physician Assistant

## 2023-06-03 DIAGNOSIS — B370 Candidal stomatitis: Secondary | ICD-10-CM | POA: Insufficient documentation

## 2023-06-03 DIAGNOSIS — R051 Acute cough: Secondary | ICD-10-CM | POA: Diagnosis present

## 2023-06-03 DIAGNOSIS — I10 Essential (primary) hypertension: Secondary | ICD-10-CM | POA: Insufficient documentation

## 2023-06-03 DIAGNOSIS — J020 Streptococcal pharyngitis: Secondary | ICD-10-CM | POA: Diagnosis present

## 2023-06-03 LAB — SARS CORONAVIRUS 2 BY RT PCR: SARS Coronavirus 2 by RT PCR: NEGATIVE

## 2023-06-03 LAB — GROUP A STREP BY PCR: Group A Strep by PCR: DETECTED — AB

## 2023-06-03 MED ORDER — PROMETHAZINE-DM 6.25-15 MG/5ML PO SYRP: 5.0000 mL | ORAL_SOLUTION | Freq: Four times a day (QID) | ORAL | 0 refills | Status: DC | PRN

## 2023-06-03 MED ORDER — NYSTATIN 100000 UNIT/ML MT SUSP: OROMUCOSAL | 0 refills | Status: DC

## 2023-06-03 MED ORDER — AMOXICILLIN 500 MG PO CAPS: 500.0000 mg | ORAL_CAPSULE | Freq: Two times a day (BID) | ORAL | 0 refills | Status: AC

## 2023-06-03 NOTE — ED Provider Notes (Signed)
 [facility]    CSN: [id number] Arrival date & time: 06/03/23  1130      History   Chief Complaint Chief Complaint  Patient presents with   Nasal Congestion    HPI Cory Campbell is a 43 y.o. male with history of hypertension, insulin -dependent type 2 diabetes, heart failure, normal, sleep apnea,, chronic pain, asthma, HIV, seizure disorder, and COPD.  Patient presents today for 4 day history of fatigue, sore throat, cough, congestion and post nasal drainage. Denies fever, ear pain, sinus pain, chest pain, SOB. No sick contacts. Taking OTC meds. No other concerns.  HPI  Past Medical History:  Diagnosis Date   (HFpEF) heart failure with preserved ejection fraction (HCC)    Acid reflux    Adopted    Anxiety    Arthritis    Arthrogryposis    Asthma    BPH (benign prostatic hyperplasia)    Bronchitis    Chronic pain syndrome    Depressive disorder, not elsewhere classified    Diabetes mellitus without complication (HCC)    Diverticulitis of colon (without mention of hemorrhage)(562.11)    ED (erectile dysfunction)    Herpes genitalis    HIV infection (HCC)    HTN (hypertension)    Hyperlipidemia    Hypertensive cardiomyopathy (HCC)    Hypogonadism in male    OAB (overactive bladder)    Other psoriasis    Seizure disorder (HCC)    Sleep apnea    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 or other cause, without mention of thyrotoxic crisis or storm    hyperthyroidism    Patient Active Problem List   Diagnosis Date Noted   Renal insufficiency 04/13/2022   End stage renal disease (HCC) 04/11/2022   Hyperlipidemia 04/11/2022   Hyperkalemia 03/02/2022   Hyponatremia 03/02/2022   Metabolic acidosis 03/02/2022   UTI (urinary tract infection) 11/24/2021   Proctitis 11/24/2021   Diabetes mellitus without complication (HCC) 11/24/2021   Hypokalemia 11/24/2021   Pancreatitis 11/24/2021   Asthma 11/24/2021   Depression with anxiety 11/24/2021    Overweight (BMI 25.0-29.9) 11/24/2021   BPH (benign prostatic hyperplasia) 11/24/2021   Chronic diastolic CHF (congestive heart failure) (HCC) 11/24/2021   Elevated lactic acid level 11/24/2021   Pedal edema 09/12/2021   Bilateral foot pain 09/09/2021   Lactic acidosis 09/09/2021   Tobacco dependence 09/09/2021   Weakness    AKI (acute kidney injury) (HCC) 06/05/2021   Postural dizziness with presyncope 06/05/2021   Arthrogryposis, with contractures and functional paraplegia    Seizure disorder (HCC)    Headache    Acute asthma exacerbation 03/30/2020   Acute respiratory failure with hypoxia (HCC) 03/29/2020   Surgical wound, non healing    Acute colitis 05/20/2017   Colitis    Acute renal failure (HCC) 05/15/2017   Diarrhea 05/15/2017   Sepsis (HCC) 05/15/2017   Abdominal pain 11/25/2016   HIV (human immunodeficiency virus infection) (HCC) 11/19/2016   Hypotension 10/08/2016   Unstable angina (HCC) 10/06/2016   Shortness of breath 06/23/2015   Obesity 05/18/2014   Frequent falls 08/12/2013   Pain of left clavicle 08/12/2013   Tachycardia 01/21/2013   Chronic pain syndrome 08/12/2012   Sleep apnea 06/07/2012   Chest pain 06/20/2011   Hypertension associated with diabetes (HCC) 06/20/2011   Insulin  dependent type 2 diabetes mellitus (HCC) 06/20/2011   Diabetes mellitus (HCC) 06/20/2011   Asthma, chronic, unspecified asthma severity, with acute exacerbation 01/18/2008   Hyperlipidemia associated with type 2 diabetes mellitus (HCC)  08/28/2006   Essential hypertension 06/27/2005    Past Surgical History:  Procedure Laterality Date   APPENDECTOMY     COLOSTOMY     CORNEAL TRANSPLANT     DIALYSIS/PERMA CATHETER INSERTION Right 03/10/2022   Procedure: DIALYSIS/PERMA CATHETER INSERTION;  Surgeon: Jackquelyn Mass, MD;  Location: [facility];  Service: Cardiovascular;  Laterality: Right;   DIALYSIS/PERMA CATHETER REMOVAL N/A 04/18/2022   Procedure: DIALYSIS/PERMA  CATHETER REMOVAL;  Surgeon: Jackquelyn Mass, MD;  Location: [facility];  Service: Cardiovascular;  Laterality: N/A;   feet surgery     both   HAND SURGERY     left and right   leg surgery     left and right   ORTHOPEDIC SURGERY     hands, feet, knees, legs   tubes in ears     both       Home Medications    Prior to Admission medications   Medication Sig Start Date End Date Taking? Authorizing Provider  promethazine -dextromethorphan  (PROMETHAZINE -DM) 6.25-15 MG/5ML syrup Take 5 mLs by mouth 4 (four) times daily as needed. 06/03/23  Yes Nancy Axon B, PA-C  acetaminophen  (TYLENOL ) 500 MG tablet Take by mouth. 01/10/23   [provider]  acyclovir  (ZOVIRAX ) 400 MG tablet Take by mouth.    [provider]  albuterol  (PROVENTIL  HFA;VENTOLIN  HFA) 108 (90 BASE) MCG/ACT inhaler Inhale 2 puffs into the lungs every 4 (four) hours as needed for wheezing or shortness of breath.     [provider]  albuterol  (PROVENTIL ) (2.5 MG/3ML) 0.083% nebulizer solution Take 2.5 mg by nebulization every 4 (four) hours as needed for wheezing or shortness of breath.    [provider]  amLODipine  (NORVASC ) 10 MG tablet Take 10 mg by mouth daily. Patient not taking: Reported on 01/29/2023    [provider]  amoxicillin  (AMOXIL ) 500 MG capsule Take 1 capsule (500 mg total) by mouth 2 (two) times daily for 10 days. 06/03/23 06/13/23 Yes Floydene Hy, PA-C  BIKTARVY  50-200-25 MG TABS tablet Take 1 tablet by mouth daily. Patient not taking: Reported on 01/29/2023 11/21/16   [provider]  buprenorphine  (BUTRANS ) 7.5 MCG/HR Place 7.5 mg onto the skin once a week. Saturday    [provider]  buPROPion  (WELLBUTRIN  XL) 150 MG 24 hr tablet Take 150 mg by mouth daily. 08/11/21   [provider]  CABENUVA 600 & 900 MG/3ML injection INJECT A TOTAL OF 6 ML IN THE MUSCLE ONCE A MONTH FOR 2 MONTHS THEN USE EVERY 2 MONTHS THEREAFTER. 06/07/22    [provider]  cetirizine (ZYRTEC) 10 MG tablet Take 10 mg by mouth daily.    [provider]  chlorthalidone  (HYGROTON ) 25 MG tablet Take 25 mg by mouth daily. 12/04/22   [provider]  citalopram  (CELEXA ) 20 MG tablet Take 20 mg by mouth daily.    [provider]  Continuous Glucose Receiver (DEXCOM G7 RECEIVER) DEVI Dispense one 12/27/22   [provider]  Continuous Glucose Sensor (DEXCOM G7 SENSOR) MISC Use every 10 days. 11/13/22   [provider]  cyclobenzaprine  (FLEXERIL ) 5 MG tablet Take 5 mg by mouth 3 (three) times daily as needed. Patient not taking: Reported on 01/29/2023 07/11/22   [provider]  diclofenac  sodium (VOLTAREN ) 1 % GEL Apply 2-4 g topically 4 (four) times daily as needed. For pain.    [provider]  dicyclomine  (BENTYL ) 10 MG capsule Take 20 mg by mouth  2 (two) times daily before a meal. 05/01/21   [provider]  doxycycline  (VIBRAMYCIN ) 100 MG capsule Take 100 mg by mouth 2 (two) times daily. 12/06/22   [provider]  Elastic Bandages & Supports (T.E.D. BELOW KNEE/MEDIUM) MISC 1 each by Does not apply route daily. Measure legs and give appropriate size. 09/12/21   Rizwan, Saima, MD  famotidine  (PEPCID ) 20 MG tablet Take 20 mg by mouth daily.  01/03/16   [provider]  feeding supplement, GLUCERNA SHAKE, (GLUCERNA SHAKE) LIQD Take 237 mLs by mouth 4 (four) times daily.    [provider]  ferrous sulfate 325 (65 FE) MG tablet Take 325 mg by mouth daily with breakfast.    [provider]  finasteride  (PROSCAR ) 5 MG tablet Take 5 mg by mouth daily.    [provider]  fluticasone  (FLONASE ) 50 MCG/ACT nasal spray Place 2 sprays into both nostrils daily. 07/04/22   Ethlyn Herd, MD  fluticasone  (FLOVENT  HFA) 110 MCG/ACT inhaler Inhale 2 puffs into the lungs 2 (two) times daily.    [provider]  gabapentin  (NEURONTIN ) 300 MG  capsule Take 1 capsule (300 mg total) by mouth 2 (two) times daily. Patient taking differently: Take 300 mg by mouth 3 (three) times daily. 09/12/21 01/29/23  Rizwan, Saima, MD  glucose 4 GM chewable tablet Chew 1 tablet by mouth as needed for low blood sugar.    [provider]  hydrALAZINE  (APRESOLINE ) 50 MG tablet Take 1 tablet (50 mg total) by mouth 3 (three) times daily. Patient taking differently: Take 25 mg by mouth 3 (three) times daily. 03/16/22   Magdalene School, MD  insulin  degludec (TRESIBA ) 100 UNIT/ML FlexTouch Pen Inject 24 Units into the skin 2 (two) times daily. 10/22/22   Ree Candy, MD  isosorbide  dinitrate (ISORDIL ) 20 MG tablet Take 1 tablet (20 mg total) by mouth 3 (three) times daily. 03/16/22   Magdalene School, MD  ketoconazole  (NIZORAL ) 2 % shampoo Apply topically. 12/18/22   [provider]  lidocaine  (LIDODERM ) 5 % 2 patches daily.    [provider]  lisinopril  (ZESTRIL ) 10 MG tablet Take 1 tablet by mouth daily. 12/27/22 12/27/23  [provider]  Melatonin 5 MG TABS Take 10 mg by mouth at bedtime as needed (sleep).    [provider]  metFORMIN (GLUCOPHAGE-XR) 500 MG 24 hr tablet Take 500 mg by mouth 2 (two) times daily with a meal. 06/23/22 06/23/23  [provider]  metoprolol  succinate (TOPROL -XL) 100 MG 24 hr tablet TAKE 1 TABLET(100 MG) BY MOUTH TWICE DAILY WITH OR IMMEDIATELY FOLLOWING A MEAL 04/30/23   Gollan, Timothy J, MD  mirtazapine  (REMERON ) 45 MG tablet Take 45 mg by mouth at bedtime.    [provider]  Multiple Vitamin (MULTIVITAMIN) tablet Take 1 tablet by mouth daily.    [provider]  nitroGLYCERIN  (NITROSTAT ) 0.4 MG SL tablet PLACE 1 TABLET UNDER THE TONGUE EVERY 5 MINUTES AS NEEDED FOR CHEST PAIN. CALL 911 IF YOU HAVE TAKEN 3 TABLETS FOR CHEST PAIN 08/03/22   Gollan, Timothy J, MD  nortriptyline  (PAMELOR ) 25 MG capsule Take 50 mg by mouth at bedtime.    [provider]   nystatin  (MYCOSTATIN ) 100000 UNIT/ML suspension Swish 5ml and retain in mouth for several seconds QID x 7 days 06/03/23  Yes Nancy Axon B, PA-C  omega-3 acid ethyl esters (LOVAZA ) 1 g capsule Take 1 g by mouth 3 (three) times daily.    [provider]  omeprazole (PRILOSEC) 20 MG capsule Take 20 mg by mouth daily. 09/08/21   [provider]  ondansetron  (ZOFRAN -ODT) 4 MG disintegrating tablet Take 4 mg by mouth every 8 (eight) hours as needed for nausea or vomiting.    [provider]  polyethylene glycol (MIRALAX  / GLYCOLAX ) 17 g packet Take 17 g by mouth 3 (three) times daily.    [provider]  potassium chloride  SA (KLOR-CON  M) 20 MEQ tablet Take 1 tablet (20 mEq total) by mouth daily for 7 days. 10/22/22 01/29/23  Ree Candy, MD  rosuvastatin  (CRESTOR ) 40 MG tablet TAKE 1 TABLET(40 MG) BY MOUTH DAILY 04/30/19   Gollan, Timothy J, MD  solifenacin (VESICARE) 10 MG tablet Take 10 mg by mouth daily.    [provider]  Spacer/Aero-Holding Chambers (AEROCHAMBER PLUS) inhaler Use as instructed 03/23/18   Ethlyn Herd, MD  TRULICITY 4.5 MG/0.5ML SOPN SMARTSIG:4.5 Milligram(s) SUB-Q Once a Week 09/23/21   [provider]    Family History Family History  Adopted: Yes  Family history unknown: Yes    Social History Social History   Tobacco Use   Smoking status: Former    Types: Pipe, Software engineer, E-cigarettes    Quit date: 01/2018    Years since quitting: 5.4   Smokeless tobacco: Never   Tobacco comments:    Declines patch  Vaping Use   Vaping status: Former   Substances: Nicotine , Flavoring  Substance Use Topics   Alcohol use: Not Currently    Alcohol/week: 3.0 standard drinks of alcohol    Types: 3 Cans of beer per week   Drug use: No     Allergies   Penicillins and Erythromycin base   Review of Systems Review of Systems  Constitutional:  Positive for fatigue. Negative for fever.  HENT:  Positive for congestion,  postnasal drip, rhinorrhea and sore throat. Negative for sinus pressure and sinus pain.   Respiratory:  Positive for cough. Negative for shortness of breath and wheezing.   Cardiovascular:  Negative for chest pain.  Gastrointestinal:  Negative for abdominal pain, diarrhea, nausea and vomiting.  Musculoskeletal:  Negative for myalgias.  Neurological:  Negative for weakness, light-headedness and headaches.  Hematological:  Negative for adenopathy.     Physical Exam Triage Vital Signs ED Triage Vitals [06/03/23 1143]  Encounter Vitals Group     BP      Systolic BP Percentile      Diastolic BP Percentile      Pulse      Resp      Temp      Temp src      SpO2      Weight 141 lb 1.5 oz (64 kg)     Height 5\' 6"  (1.676 m)     Head Circumference      Peak Flow      Pain Score 7     Pain Loc      Pain Education      Exclude from Growth Chart    No data found.  Updated Vital Signs BP (!) 153/105 (BP Location: Left Arm)   Pulse 89   Temp 98.3 F (36.8 C) (Oral)   Resp 16   Ht 5\' 6"  (1.676 m)   Wt 141 lb 1.5 oz (64 kg)   SpO2 95%   BMI 22.77 kg/m   Physical Exam Vitals and nursing note reviewed.  Constitutional:      General: He is not in acute distress.  Appearance: Normal appearance. He is well-developed. He is obese. He is not ill-appearing.  HENT:     Head: Normocephalic and atraumatic.     Right Ear: Tympanic membrane, ear canal and external ear normal.     Left Ear: Tympanic membrane, ear canal and external ear normal.     Nose: Congestion present.     Mouth/Throat:     Mouth: Mucous membranes are moist.     Pharynx: Oropharynx is clear. Posterior oropharyngeal erythema present.     Comments: Ulcerations and whitish exudates of tongue and posterior pharynx Eyes:     General: No scleral icterus.    Conjunctiva/sclera: Conjunctivae normal.  Cardiovascular:     Rate and Rhythm: Normal rate and regular rhythm.  Pulmonary:     Effort: Pulmonary effort is normal.  No respiratory distress.     Breath sounds: Normal breath sounds.  Musculoskeletal:     Cervical back: Neck supple.  Skin:    General: Skin is warm and dry.     Capillary Refill: Capillary refill takes less than 2 seconds.  Neurological:     General: No focal deficit present.     Mental Status: He is alert. Mental status is at baseline.     Motor: No weakness.  Psychiatric:        Mood and Affect: Mood normal.        Behavior: Behavior normal.      UC Treatments / Results  Labs (all labs ordered are listed, but only abnormal results are displayed) Labs Reviewed  GROUP A STREP BY PCR - Abnormal; Notable for the following components:      Result Value   Group A Strep by PCR DETECTED (*)    All other components within normal limits  SARS CORONAVIRUS 2 BY RT PCR    EKG   Radiology No results found.  Procedures Procedures (including critical care time)  Medications Ordered in UC Medications - No data to display  Initial Impression / Assessment and Plan / UC Course  I have reviewed the triage vital signs and the nursing notes.  Pertinent labs & imaging results that were available during my care of the patient were reviewed by me and considered in my medical decision making (see chart for details).   43 y/o male with history of insulin  dependent T2DM, HTN, asthma/COPD, HIV, and heart failure presents for 4 day  history of fatigue, cough, congestion, sore throat and PND.  BP elevated at 153/105. Patient is on hydralazine , metoprolol , and isosorbide . Advised to continue meds and keep log of BP. If consistently >140/90 to follow up with PCP.   On exam has nasal congestion and erythema of posterior pharynx.  Ulcerations and whitish exudates of tongue and posterior pharynx. Chest clear. Heart RRR.   COVID and strep test obtained.  Negative COVID.  Positive strep.  Reviewed all results patient.  Treated for strep pharyngitis with amoxicillin .  He has penicillin listed as an  allergy but has taken amoxicillin  many times without any issues and took amoxicillin  a few months ago without any problems.  Also sent nystatin  mouthwash for suspected thrush.  Promethazine  DM syrup for cough and congestion.  Reviewed supportive care with increasing rest and fluids.  Thoroughly reviewed ED precautions.   Final Clinical Impressions(s) / UC Diagnoses   Final diagnoses:  Strep pharyngitis  Acute cough  Thrush  Essential hypertension     Discharge Instructions      - Negative COVID test. - Strep.  I sent amoxicillin  to pharmacy.  I think you also have thrush so I sent in an antifungal mouthwash. - Sent cough medicine.  Increase rest and fluids. - Return if fever or worsening symptoms.     ED Prescriptions     Medication Sig Dispense Auth. Provider   amoxicillin  (AMOXIL ) 500 MG capsule Take 1 capsule (500 mg total) by mouth 2 (two) times daily for 10 days. 20 capsule Nancy Axon B, PA-C   nystatin  (MYCOSTATIN ) 100000 UNIT/ML suspension Swish 5ml and retain in mouth for several seconds QID x 7 days 140 mL Nancy Axon B, PA-C   promethazine -dextromethorphan  (PROMETHAZINE -DM) 6.25-15 MG/5ML syrup Take 5 mLs by mouth 4 (four) times daily as needed. 118 mL Floydene Hy, PA-C      PDMP not reviewed this encounter.   Floydene Hy, PA-C 06/03/23 1249

## 2023-06-03 NOTE — Discharge Instructions (Addendum)
-   Negative COVID test. - Strep.  I sent amoxicillin  to pharmacy.  I think you also have thrush so I sent in an antifungal mouthwash. - Sent cough medicine.  Increase rest and fluids. - Return if fever or worsening symptoms.

## 2023-06-03 NOTE — ED Triage Notes (Signed)
 Pt c/o sore throat, post nasal drainage. Started about 4 days ago. Denies fever.

## 2023-06-05 ENCOUNTER — Emergency Department
Admission: EM | Admit: 2023-06-05 | Discharge: 2023-06-05 | Disposition: A | Discharge status: Home / Self Care | Attending: Emergency Medicine | Admitting: Emergency Medicine

## 2023-06-05 ENCOUNTER — Other Ambulatory Visit: Payer: Self-pay

## 2023-06-05 ENCOUNTER — Emergency Department

## 2023-06-05 DIAGNOSIS — S80811A Abrasion, right lower leg, initial encounter: Secondary | ICD-10-CM | POA: Diagnosis not present

## 2023-06-05 DIAGNOSIS — M79606 Pain in leg, unspecified: Secondary | ICD-10-CM

## 2023-06-05 DIAGNOSIS — I10 Essential (primary) hypertension: Secondary | ICD-10-CM | POA: Diagnosis not present

## 2023-06-05 DIAGNOSIS — Z23 Encounter for immunization: Secondary | ICD-10-CM | POA: Diagnosis not present

## 2023-06-05 DIAGNOSIS — S8991XA Unspecified injury of right lower leg, initial encounter: Secondary | ICD-10-CM | POA: Diagnosis present

## 2023-06-05 DIAGNOSIS — Z21 Asymptomatic human immunodeficiency virus [HIV] infection status: Secondary | ICD-10-CM | POA: Insufficient documentation

## 2023-06-05 DIAGNOSIS — E119 Type 2 diabetes mellitus without complications: Secondary | ICD-10-CM | POA: Diagnosis not present

## 2023-06-05 HISTORY — DX: Heart failure, unspecified: I50.9

## 2023-06-05 HISTORY — DX: Disorder of kidney and ureter, unspecified: N28.9

## 2023-06-05 HISTORY — DX: Arthrogryposis multiplex congenita: Q74.3

## 2023-06-05 MED ORDER — BACITRACIN ZINC 500 UNIT/GM EX OINT
TOPICAL_OINTMENT | Freq: Once | CUTANEOUS | Status: AC
  Administered 2023-06-05: 1 via TOPICAL
  Filled 2023-06-05: qty 1.8

## 2023-06-05 MED ORDER — TETANUS-DIPHTH-ACELL PERTUSSIS 5-2.5-18.5 LF-MCG/0.5 IM SUSY
0.5000 mL | PREFILLED_SYRINGE | Freq: Once | INTRAMUSCULAR | Status: AC
  Administered 2023-06-05: 0.5 mL via INTRAMUSCULAR
  Filled 2023-06-05: qty 0.5

## 2023-06-05 NOTE — ED Notes (Signed)
 [facility] called  for  transport home

## 2023-06-05 NOTE — ED Notes (Signed)
 Portable Xray at bedside.
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
## 2023-06-05 NOTE — ED Notes (Signed)
 Dressing placed to RLE. Bacitracin applied, then nonadherent placed and wrapped in kerlix.
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
## 2023-06-05 NOTE — Discharge Instructions (Signed)
 Follow-up with your regular doctor for improving in 3 days. Take over-the-counter medication for pain as needed Continue the antibiotics that you are currently taking And change the dressings daily Return to emergency department if worsening

## 2023-06-05 NOTE — ED Notes (Signed)
 AVS provided to and discussed with patient. Pt verbalizes understanding of discharge instructions and denies any questions or concerns at this time. [facility] here to transport patient home.

## 2023-07-03 NOTE — Progress Notes (Unsigned)
 Cardiology Clinic Note   Date: 07/06/2023 ID: Johnwesley J Scheid, DOB [birth year], MRN [id number]  Primary Cardiologist:  Belva Boyden, MD  Chief Complaint   Cory Campbell is a 43 y.o. male who presents to the clinic today for routine follow up.   Patient Profile   Maximiliano Cromartie Devincent is followed by Dr. Gollan for the history outlined below.      Past medical history significant for: Chronic diastolic heart failure. Echo 10/08/2016: EF 50 to 55%.  Distal septal apical hypokinesis.  No significant valvular abnormalities. Palpitations. 48-hour Holter 02/02/2017: Normal sinus rhythm with rare PVCs. Hypertension. Hyperlipidemia. Lipid panel 02/08/2023: LDL 35, HDL 20, TG 386, total 111. Arthrogryposis multiplex congenita.  Asthma. OSA. On CPAP. Colitis. Pancreatitis. HIV. IDDM. CKD.   In summary, patient was initially evaluated by Dr. Gollan in May 2013 for chest pain after several ED evaluations.  He underwent stress testing significant GI uptake artifact, no evidence of ischemia, EF 56%, low risk stress test.  Echo September 2018 showed normal LV function as above.  48-hour Holter monitor in December 2018 showed normal sinus rhythm with rare PVCs.  Patient underwent hospital admission in January 2024 and was started on dialysis for AKI.  He followed-up in the office March 2024 and reported doing well.  Repeat echo was offered but patient declined at that time.   Patient was last seen in the office by me on 01/29/2023 for routine follow-up.  He reported occasional palpitations typically at night that were not bothersome to him.  He had no other cardiac complaints and no medication changes were made.     History of Present Illness    Today, patient is doing well. Patient denies lower extremity edema, abdominal bloating/fullness, orthopnea or PND. He reports dyspnea with heavier exertion primarily when he rushes when using his crutches. If he paces himself he has no dyspnea. He uses  his crutches mostly around his home and his motorized wheelchair out in public. No chest pain, pressure, or tightness. He reports occasional palpitations at night that do not really bother him. He is heading to [place]  in a couple of weeks to visit with his wife's family and celebrate his son's 4th birthday.      ROS: All other systems reviewed and are otherwise negative except as noted in History of Present Illness.  EKGs/Labs Reviewed    EKG Interpretation Date/Time:  Friday Jul 06 2023 08:08:32 EDT Ventricular Rate:  79 PR Interval:  138 QRS Duration:  96 QT Interval:  364 QTC Calculation: 417 R Axis:   15  Text Interpretation: Normal sinus rhythm Normal ECG When compared with ECG of 29-Jan-2023 14:35, QRS duration has decreased Criteria for Septal infarct are no longer Present Confirmed by Morey Ar [phone]) on 07/06/2023 8:10:47 AM   10/22/2022: ALT 14; AST 12; BUN 12; Creatinine, Ser 1.28; Potassium 3.0; Sodium 140   10/22/2022: Hemoglobin 12.6; WBC 8.1    Physical Exam    VS:  BP 123/85 (BP Location: Left Arm, Patient Position: Sitting, Cuff Size: Normal)   Pulse 79   Ht 5\' 6"  (1.676 m)   Wt 138 lb (62.6 kg)   SpO2 95%   BMI 22.27 kg/m  , BMI Body mass index is 22.27 kg/m.  GEN: Well nourished, well developed, in no acute distress. Neck: No JVD or carotid bruits. Cardiac:  RRR. No murmurs. No rubs or gallops.   Respiratory:  Respirations regular and unlabored. Clear to auscultation without rales, wheezing or rhonchi.  GI: Soft, nontender, nondistended. Extremities: Radials 2+ and equal bilaterally. No clubbing or cyanosis. No edema. Muscle atrophy bilateral lower extremities.  Skin: Warm and dry, no rash. Neuro: Strength intact.  Assessment & Plan   Chronic diastolic heart failure Echo September 2018 showed normal LV/RV function with no significant valvular abnormalities.  Patient denies lower extremity edema, abdominal fullness/bloating, orthopnea or PND.  He has dyspnea with heavy exertion such as rushing while he is walking with his crutches. No dyspnea if he paces himself. He uses crutches around his home and his motorized wheelchair in public.  Euvolemic and well compensated on exam. -Continue Toprol , lisinopril , isosorbide  dinitrate, hydralazine .   Palpitations Patient reports palpitations described as hard beats that typically occur at night with no associated symptoms. They are not bothersome to him. -Continue Toprol . -He is instructed to contact the office if they become more frequent or sustained.    Hypertension BP today 123/85.  -Continue clonidine , hydralazine , isosorbide  dinitrate, Toprol .  Hyperlipidemia/hypertriglyceridemia LDL 35, TG 386 January 2025, LDL at goal. - Continue rosuvastatin  and Lovaza .  Disposition: Return in 6 months or sooner as needed.          Signed, Lonell Rives. Nitika Jackowski, DNP, NP-C

## 2023-07-06 ENCOUNTER — Encounter: Payer: Self-pay | Admitting: Student

## 2023-07-06 ENCOUNTER — Ambulatory Visit: Payer: Medicaid Other | Attending: Student | Admitting: Student

## 2023-07-06 VITALS — BP 123/85 | HR 79 | Ht 66.0 in | Wt 138.0 lb

## 2023-07-06 DIAGNOSIS — I10 Essential (primary) hypertension: Secondary | ICD-10-CM | POA: Insufficient documentation

## 2023-07-06 DIAGNOSIS — I5032 Chronic diastolic (congestive) heart failure: Secondary | ICD-10-CM | POA: Diagnosis present

## 2023-07-06 DIAGNOSIS — E782 Mixed hyperlipidemia: Secondary | ICD-10-CM | POA: Insufficient documentation

## 2023-07-06 DIAGNOSIS — R002 Palpitations: Secondary | ICD-10-CM | POA: Diagnosis present

## 2023-07-06 NOTE — Patient Instructions (Signed)
 Medication Instructions:  Your Physician recommend you continue on your current medication as directed.    *If you need a refill on your cardiac medications before your next appointment, please call your pharmacy*  Lab Work: None ordered at this time  If you have labs (blood work) drawn today and your tests are completely normal, you will receive your results only by: MyChart Message (if you have MyChart) OR A paper copy in the mail If you have any lab test that is abnormal or we need to change your treatment, we will call you to review the results.  Testing/Procedures: None ordered at this time   Follow-Up: At [facility], you and your health needs are our priority.  As part of our continuing mission to provide you with exceptional heart care, our providers are all part of one team.  This team includes your primary Cardiologist (physician) and Advanced Practice Providers or APPs (Physician Assistants and Nurse Practitioners) who all work together to provide you with the care you need, when you need it.  Your next appointment:   6 month(s)  Provider:   Timothy Gollan, MD or Morey Ar, NP    We recommend signing up for the patient portal called "MyChart".  Sign up information is provided on this After Visit Summary.  MyChart is used to connect with patients for Virtual Visits (Telemedicine).  Patients are able to view lab/test results, encounter notes, upcoming appointments, etc.  Non-urgent messages can be sent to your provider as well.   To learn more about what you can do with MyChart, go to [email or web address].

## 2023-07-27 ENCOUNTER — Ambulatory Visit
Admission: EM | Admit: 2023-07-27 | Discharge: 2023-07-27 | Disposition: A | Discharge status: Home / Self Care | Attending: Emergency Medicine | Admitting: Emergency Medicine

## 2023-07-27 DIAGNOSIS — R300 Dysuria: Secondary | ICD-10-CM | POA: Diagnosis present

## 2023-07-27 DIAGNOSIS — N3090 Cystitis, unspecified without hematuria: Secondary | ICD-10-CM

## 2023-07-27 DIAGNOSIS — Z8679 Personal history of other diseases of the circulatory system: Secondary | ICD-10-CM

## 2023-07-27 LAB — URINALYSIS, W/ REFLEX TO CULTURE (INFECTION SUSPECTED)
Bilirubin Urine: NEGATIVE
Glucose, UA: 250 mg/dL — AB
Nitrite: NEGATIVE
Protein, ur: 100 mg/dL — AB
Specific Gravity, Urine: >1.03 — ABNORMAL HIGH (ref 1.005–1.030)
WBC, UA: >50 WBC/hpf (ref 0–5)
pH: 5.5 (ref 5.0–8.0)

## 2023-07-27 LAB — GLUCOSE, CAPILLARY: Glucose-Capillary: 157 mg/dL — ABNORMAL HIGH (ref 70–99)

## 2023-07-27 MED ORDER — SULFAMETHOXAZOLE-TRIMETHOPRIM 800-160 MG PO TABS: 1.0000 | ORAL_TABLET | Freq: Two times a day (BID) | ORAL | 0 refills | Status: DC

## 2023-07-27 NOTE — ED Triage Notes (Signed)
 Pt c/o possible UTI x3days  Pt states that he is having urinary pain and odor

## 2023-07-27 NOTE — Discharge Instructions (Signed)
 You urine is positive for UTI. Take antibiotic as directed(bactrim , urine culture sent, if antibiotic needs to be changed you will be notified), check my chart for results. Drink plenty of water, follow up with PCP.   Go to Er for new or worsening issues or concerns(fever, nausea, vomiting, unable to keep meds down, muscle aches, etc)

## 2023-07-27 NOTE — ED Provider Notes (Signed)
 [facility]    CSN: [id number] Arrival date & time: 07/27/23  1322      History   Chief Complaint Chief Complaint  Patient presents with   Urinary Tract Infection    HPI Cory Campbell is a 43 y.o. male.   43 year old male pt, Cory Campbell, presents to urgent care for evaluation of dysuria x 3 days, patient reports malodor.  Pt reports last UTI was few months prior, +allergy to PCN  The history is provided by the patient. No language interpreter was used.    Past Medical History:  Diagnosis Date   (HFpEF) heart failure with preserved ejection fraction (HCC)    Acid reflux    Adopted    [facility] (arthrogryposis multiplex congenita)    wheelchair bound because of this   Anxiety    Arthritis    Arthrogryposis    Asthma    BPH (benign prostatic hyperplasia)    Bronchitis    CHF (congestive heart failure) (HCC)    Chronic pain syndrome    Depressive disorder, not elsewhere classified    Diabetes mellitus without complication (HCC)    Diverticulitis of colon (without mention of hemorrhage)(562.11)    ED (erectile dysfunction)    Herpes genitalis    HIV infection (HCC)    HTN (hypertension)    Hyperlipidemia    Hypertensive cardiomyopathy (HCC)    Hypogonadism in male    OAB (overactive bladder)    Other psoriasis    Renal disorder    Seizure disorder (HCC)    Sleep apnea    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 or other cause, without mention of thyrotoxic crisis or storm    hyperthyroidism    Patient Active Problem List   Diagnosis Date Noted   Cystitis 07/27/2023   Dysuria 07/27/2023   History of hypertension 07/27/2023   Renal insufficiency 04/13/2022   End stage renal disease (HCC) 04/11/2022   Hyperlipidemia 04/11/2022   Hyperkalemia 03/02/2022   Hyponatremia 03/02/2022   Metabolic acidosis 03/02/2022   UTI (urinary tract infection) 11/24/2021   Proctitis 11/24/2021   Diabetes mellitus without complication (HCC) 11/24/2021    Hypokalemia 11/24/2021   Pancreatitis 11/24/2021   Asthma 11/24/2021   Depression with anxiety 11/24/2021   Overweight (BMI 25.0-29.9) 11/24/2021   BPH (benign prostatic hyperplasia) 11/24/2021   Chronic diastolic CHF (congestive heart failure) (HCC) 11/24/2021   Elevated lactic acid level 11/24/2021   Pedal edema 09/12/2021   Bilateral foot pain 09/09/2021   Lactic acidosis 09/09/2021   Tobacco dependence 09/09/2021   Weakness    AKI (acute kidney injury) (HCC) 06/05/2021   Postural dizziness with presyncope 06/05/2021   Arthrogryposis, with contractures and functional paraplegia    Seizure disorder (HCC)    Headache    Acute asthma exacerbation 03/30/2020   Acute respiratory failure with hypoxia (HCC) 03/29/2020   Surgical wound, non healing    Acute colitis 05/20/2017   Colitis    Acute renal failure (HCC) 05/15/2017   Diarrhea 05/15/2017   Sepsis (HCC) 05/15/2017   Abdominal pain 11/25/2016   HIV (human immunodeficiency virus infection) (HCC) 11/19/2016   Hypotension 10/08/2016   Unstable angina (HCC) 10/06/2016   Shortness of breath 06/23/2015   Obesity 05/18/2014   Frequent falls 08/12/2013   Pain of left clavicle 08/12/2013   Tachycardia 01/21/2013   Chronic pain syndrome 08/12/2012   Sleep apnea 06/07/2012   Chest pain 06/20/2011   Hypertension associated with diabetes (HCC) 06/20/2011   Insulin  dependent  type 2 diabetes mellitus (HCC) 06/20/2011   Diabetes mellitus (HCC) 06/20/2011   Asthma, chronic, unspecified asthma severity, with acute exacerbation 01/18/2008   Hyperlipidemia associated with type 2 diabetes mellitus (HCC) 08/28/2006   Essential hypertension 06/27/2005    Past Surgical History:  Procedure Laterality Date   APPENDECTOMY     COLOSTOMY     CORNEAL TRANSPLANT     DIALYSIS/PERMA CATHETER INSERTION Right 03/10/2022   Procedure: DIALYSIS/PERMA CATHETER INSERTION;  Surgeon: Jackquelyn Mass, MD;  Location: [facility] INVASIVE [facility];  Service:  Cardiovascular;  Laterality: Right;   DIALYSIS/PERMA CATHETER REMOVAL N/A 04/18/2022   Procedure: DIALYSIS/PERMA CATHETER REMOVAL;  Surgeon: Jackquelyn Mass, MD;  Location: [facility] INVASIVE CV [facility];  Service: Cardiovascular;  Laterality: N/A;   feet surgery     both   HAND SURGERY     left and right   leg surgery     left and right   ORTHOPEDIC SURGERY     hands, feet, knees, legs   tubes in ears     both       Home Medications    Prior to Admission medications   Medication Sig Start Date End Date Taking? Authorizing Provider  acetaminophen  (TYLENOL ) 500 MG tablet Take by mouth. 01/10/23  Yes [provider]  acyclovir  (ZOVIRAX ) 400 MG tablet Take by mouth.   Yes [provider]  albuterol  (PROVENTIL  HFA;VENTOLIN  HFA) 108 (90 BASE) MCG/ACT inhaler Inhale 2 puffs into the lungs every 4 (four) hours as needed for wheezing or shortness of breath.    Yes [provider]  albuterol  (PROVENTIL ) (2.5 MG/3ML) 0.083% nebulizer solution Take 2.5 mg by nebulization every 4 (four) hours as needed for wheezing or shortness of breath.   Yes [provider]  amLODipine  (NORVASC ) 10 MG tablet Take 10 mg by mouth daily.   Yes [provider]  BIKTARVY  50-200-25 MG TABS tablet Take 1 tablet by mouth daily. 11/21/16  Yes [provider]  buprenorphine  (BUTRANS ) 7.5 MCG/HR Place 7.5 mg onto the skin once a week. Saturday   Yes [provider]  buPROPion  (WELLBUTRIN  XL) 150 MG 24 hr tablet Take 150 mg by mouth daily. 08/11/21  Yes [provider]  CABENUVA 600 & 900 MG/3ML injection INJECT A TOTAL OF 6 ML IN THE MUSCLE ONCE A MONTH FOR 2 MONTHS THEN USE EVERY 2 MONTHS THEREAFTER. 06/07/22  Yes [provider]  cetirizine (ZYRTEC) 10 MG tablet Take 10 mg by mouth daily.   Yes [provider]  chlorthalidone  (HYGROTON ) 25 MG tablet Take 25 mg by mouth daily. 12/04/22  Yes [provider]  citalopram  (CELEXA ) 20 MG  tablet Take 20 mg by mouth daily.   Yes [provider]  Continuous Glucose Receiver (DEXCOM G7 RECEIVER) DEVI Dispense one 12/27/22  Yes [provider]  Continuous Glucose Sensor (DEXCOM G7 SENSOR) MISC Use every 10 days. 11/13/22  Yes [provider]  cyclobenzaprine  (FLEXERIL ) 5 MG tablet Take 5 mg by mouth 3 (three) times daily as needed. 07/11/22  Yes [provider]  diclofenac  sodium (VOLTAREN ) 1 % GEL Apply 2-4 g topically 4 (four) times daily as needed. For pain.   Yes [provider]  dicyclomine  (BENTYL ) 10 MG capsule Take 20 mg by mouth 2 (two) times daily before a meal. 05/01/21  Yes [provider]  Elastic Bandages & Supports (T.E.D. BELOW KNEE/MEDIUM) MISC 1 each by Does not apply route daily. Measure legs and give appropriate size. 09/12/21  Yes Rizwan, Saima, MD  famotidine  (PEPCID ) 20 MG tablet Take 20 mg by mouth daily.  01/03/16  Yes [provider]  feeding supplement, GLUCERNA SHAKE, (GLUCERNA SHAKE) LIQD Take 237 mLs by mouth 4 (four) times daily.   Yes [provider]  ferrous sulfate 325 (65 FE) MG tablet Take 325 mg by mouth daily with breakfast.   Yes [provider]  finasteride  (PROSCAR ) 5 MG tablet Take 5 mg by mouth daily.   Yes [provider]  fluticasone  (FLONASE ) 50 MCG/ACT nasal spray Place 2 sprays into both nostrils daily. 07/04/22  Yes Ethlyn Herd, MD  fluticasone  (FLOVENT  HFA) 110 MCG/ACT inhaler Inhale 2 puffs into the lungs 2 (two) times daily.   Yes [provider]  glucose 4 GM chewable tablet Chew 1 tablet by mouth as needed for low blood sugar.   Yes [provider]  hydrALAZINE  (APRESOLINE ) 50 MG tablet Take 1 tablet (50 mg total) by mouth 3 (three) times daily. Patient taking differently: Take 25 mg by mouth 3 (three) times daily. 03/16/22  Yes Magdalene School, MD  insulin  degludec (TRESIBA ) 100 UNIT/ML FlexTouch Pen Inject 24 Units into the skin 2  (two) times daily. 10/22/22  Yes Ree Candy, MD  isosorbide  dinitrate (ISORDIL ) 20 MG tablet Take 1 tablet (20 mg total) by mouth 3 (three) times daily. 03/16/22  Yes Magdalene School, MD  ketoconazole  (NIZORAL ) 2 % shampoo Apply topically. 12/18/22  Yes [provider]  lidocaine  (LIDODERM ) 5 % 2 patches daily.   Yes [provider]  lisinopril  (ZESTRIL ) 10 MG tablet Take 1 tablet by mouth daily. 12/27/22 12/27/23 Yes [provider]  Melatonin 5 MG TABS Take 10 mg by mouth at bedtime as needed (sleep).   Yes [provider]  metoprolol  succinate (TOPROL -XL) 100 MG 24 hr tablet TAKE 1 TABLET(100 MG) BY MOUTH TWICE DAILY WITH OR IMMEDIATELY FOLLOWING A MEAL 04/30/23  Yes Gollan, Timothy J, MD  mirtazapine  (REMERON ) 45 MG tablet Take 45 mg by mouth at bedtime.   Yes [provider]  Multiple Vitamin (MULTIVITAMIN) tablet Take 1 tablet by mouth daily.   Yes [provider]  nitroGLYCERIN  (NITROSTAT ) 0.4 MG SL tablet PLACE 1 TABLET UNDER THE TONGUE EVERY 5 MINUTES AS NEEDED FOR CHEST PAIN. CALL 911 IF YOU HAVE TAKEN 3 TABLETS FOR CHEST PAIN 08/03/22  Yes Gollan, Timothy J, MD  nortriptyline  (PAMELOR ) 25 MG capsule Take 50 mg by mouth at bedtime.   Yes [provider]  nystatin  (MYCOSTATIN ) 100000 UNIT/ML suspension Swish 5ml and retain in mouth for several seconds QID x 7 days 06/03/23  Yes Nancy Axon B, PA-C  omega-3 acid ethyl esters (LOVAZA ) 1 g capsule Take 1 g by mouth 3 (three) times daily.   Yes [provider]  omeprazole (PRILOSEC) 20 MG capsule Take 20 mg by mouth daily. 09/08/21  Yes [provider]  ondansetron  (ZOFRAN -ODT) 4 MG disintegrating tablet Take 4 mg by mouth every 8 (eight) hours as needed for nausea or vomiting.   Yes [provider]  polyethylene glycol (MIRALAX  / GLYCOLAX ) 17 g packet Take 17 g by mouth 3 (three) times daily.   Yes [provider]  rosuvastatin  (CRESTOR ) 40 MG  tablet TAKE 1 TABLET(40 MG) BY MOUTH DAILY 04/30/19  Yes Gollan, Timothy J, MD  solifenacin (VESICARE) 10 MG tablet Take 10 mg by mouth daily.   Yes [provider]  Spacer/Aero-Holding Chambers (AEROCHAMBER PLUS) inhaler Use as instructed 03/23/18  Yes Ethlyn Herd, MD  sulfamethoxazole -trimethoprim  (BACTRIM  DS) 800-160 MG tablet Take 1 tablet by mouth 2 (two) times daily for 7 days. 07/27/23 08/03/23 Yes Ravonda Brecheen, Eveleen Hinds, NP  TRULICITY 4.5 MG/0.5ML SOPN SMARTSIG:4.5 Milligram(s) SUB-Q Once a Week 09/23/21  Yes [provider]  gabapentin  (NEURONTIN ) 300 MG capsule Take 1 capsule (300 mg total) by mouth 2 (two) times daily. Patient taking differently: Take 300 mg by mouth 3 (three) times daily. 09/12/21 01/29/23  Rizwan, Saima, MD  potassium chloride  SA (KLOR-CON  M) 20 MEQ tablet Take 1 tablet (20 mEq total) by mouth daily for 7 days. 10/22/22 01/29/23  Ree Candy, MD  promethazine -dextromethorphan  (PROMETHAZINE -DM) 6.25-15 MG/5ML syrup Take 5 mLs by mouth 4 (four) times daily as needed. 06/03/23   Floydene Hy, PA-C    Family History Family History  Adopted: Yes  Family history unknown: Yes    Social History Social History   Tobacco Use   Smoking status: Former    Types: Pipe, Software engineer, E-cigarettes    Quit date: 01/2018    Years since quitting: 5.5   Smokeless tobacco: Never   Tobacco comments:    Declines patch  Vaping Use   Vaping status: Former   Substances: Nicotine , Flavoring  Substance Use Topics   Alcohol use: Not Currently    Alcohol/week: 3.0 standard drinks of alcohol    Types: 3 Cans of beer per week   Drug use: No     Allergies   Penicillins and Erythromycin base   Review of Systems Review of Systems  Constitutional:  Negative for fever.  Gastrointestinal:  Positive for abdominal pain. Negative for diarrhea, nausea and vomiting.  Genitourinary:  Positive for dysuria.  All other systems reviewed and are negative.    Physical  Exam Triage Vital Signs ED Triage Vitals [07/27/23 1341]  Encounter Vitals Group     BP      Girls Systolic BP Percentile      Girls Diastolic BP Percentile      Boys Systolic BP Percentile      Boys Diastolic BP Percentile      Pulse      Resp      Temp      Temp src      SpO2      Weight 142 lb (64.4 kg)     Height 5' 6 (1.676 m)     Head Circumference      Peak Flow      Pain Score 9     Pain Loc      Pain Education      Exclude from Growth Chart    No data found.  Updated Vital Signs BP (!) 146/112 (BP Location: Left Arm)   Pulse 88   Temp 98 F (36.7 C) (Oral)   Ht 5' 6 (1.676 m)   Wt 142 lb (64.4 kg)   SpO2 99%   BMI 22.92 kg/m   Visual Acuity Right Eye Distance:   Left Eye Distance:   Bilateral Distance:    Right Eye Near:   Left Eye Near:    Bilateral Near:     Physical Exam Vitals and nursing note reviewed.  Constitutional:      General: He is not in acute distress.    Appearance: He is well-developed and well-groomed.  HENT:     Head: Normocephalic and atraumatic.   Eyes:     Conjunctiva/sclera: Conjunctivae normal.    Cardiovascular:     Rate and Rhythm: Normal  rate.     Heart sounds: No murmur heard. Pulmonary:     Effort: Pulmonary effort is normal. No respiratory distress.  Abdominal:     Palpations: Abdomen is soft.     Tenderness: There is no abdominal tenderness.   Musculoskeletal:        General: No swelling.     Cervical back: Neck supple.   Skin:    General: Skin is warm and dry.     Capillary Refill: Capillary refill takes less than 2 seconds.   Neurological:     General: No focal deficit present.     Mental Status: He is alert and oriented to person, place, and time.     GCS: GCS eye subscore is 4. GCS verbal subscore is 5. GCS motor subscore is 6.   Psychiatric:        Attention and Perception: Attention normal.        Mood and Affect: Mood normal.        Speech: Speech normal.        Behavior: Behavior  normal. Behavior is cooperative.      UC Treatments / Results  Labs (all labs ordered are listed, but only abnormal results are displayed) Labs Reviewed  URINALYSIS, W/ REFLEX TO CULTURE (INFECTION SUSPECTED) - Abnormal; Notable for the following components:      Result Value   Color, Urine STRAW (*)    APPearance HAZY (*)    Specific Gravity, Urine >1.030 (*)    Glucose, UA 250 (*)    Hgb urine dipstick TRACE (*)    Ketones, ur TRACE (*)    Protein, ur 100 (*)    Leukocytes,Ua MODERATE (*)    Bacteria, UA MANY (*)    All other components within normal limits  GLUCOSE, CAPILLARY - Abnormal; Notable for the following components:   Glucose-Capillary 157 (*)    All other components within normal limits  URINE CULTURE  CBG MONITORING, ED    EKG   Radiology No results found.  Procedures Procedures (including critical care time)  Medications Ordered in UC Medications - No data to display  Initial Impression / Assessment and Plan / UC Course  I have reviewed the triage vital signs and the nursing notes.  Pertinent labs & imaging results that were available during my care of the patient were reviewed by me and considered in my medical decision making (see chart for details).    Discussed exam findings and plan of care with patient, bactrim  scripted, UC pending, strict go to ER precautions given.   Patient verbalized understanding to this provider.  Ddx: Cystitis , dysuria, hypertension Final Clinical Impressions(s) / UC Diagnoses   Final diagnoses:  Cystitis  Dysuria  History of hypertension     Discharge Instructions      You urine is positive for UTI. Take antibiotic as directed(bactrim , urine culture sent, if antibiotic needs to be changed you will be notified), check my chart for results. Drink plenty of water, follow up with PCP.   Go to Er for new or worsening issues or concerns(fever, nausea, vomiting, unable to keep meds down, muscle aches,  etc)     ED Prescriptions     Medication Sig Dispense Auth. Provider   sulfamethoxazole -trimethoprim  (BACTRIM  DS) 800-160 MG tablet Take 1 tablet by mouth 2 (two) times daily for 7 days. 14 tablet Zain Bingman, Eveleen Hinds, NP      PDMP not reviewed this encounter.   Peter Brands, NP 07/27/23 2038

## 2023-07-29 LAB — URINE CULTURE: Culture: >=100000 — AB

## 2023-07-30 ENCOUNTER — Other Ambulatory Visit: Payer: Self-pay

## 2023-07-30 ENCOUNTER — Inpatient Hospital Stay
Admission: EM | Admit: 2023-07-30 | Discharge: 2023-08-02 | DRG: 872 | Disposition: A | Discharge status: Home / Self Care | Attending: Student in an Organized Health Care Education/Training Program | Admitting: Student in an Organized Health Care Education/Training Program

## 2023-07-30 ENCOUNTER — Emergency Department

## 2023-07-30 ENCOUNTER — Ambulatory Visit (HOSPITAL_COMMUNITY): Payer: Self-pay

## 2023-07-30 DIAGNOSIS — I130 Hypertensive heart and chronic kidney disease with heart failure and stage 1 through stage 4 chronic kidney disease, or unspecified chronic kidney disease: Secondary | ICD-10-CM | POA: Diagnosis present

## 2023-07-30 DIAGNOSIS — F32A Depression, unspecified: Secondary | ICD-10-CM | POA: Diagnosis present

## 2023-07-30 DIAGNOSIS — I43 Cardiomyopathy in diseases classified elsewhere: Secondary | ICD-10-CM | POA: Diagnosis present

## 2023-07-30 DIAGNOSIS — N412 Abscess of prostate: Secondary | ICD-10-CM | POA: Diagnosis present

## 2023-07-30 DIAGNOSIS — N390 Urinary tract infection, site not specified: Secondary | ICD-10-CM | POA: Diagnosis present

## 2023-07-30 DIAGNOSIS — R5383 Other fatigue: Principal | ICD-10-CM

## 2023-07-30 DIAGNOSIS — G40909 Epilepsy, unspecified, not intractable, without status epilepticus: Secondary | ICD-10-CM | POA: Diagnosis present

## 2023-07-30 DIAGNOSIS — N1831 Chronic kidney disease, stage 3a: Secondary | ICD-10-CM | POA: Diagnosis present

## 2023-07-30 DIAGNOSIS — Z794 Long term (current) use of insulin: Secondary | ICD-10-CM

## 2023-07-30 DIAGNOSIS — Z87891 Personal history of nicotine dependence: Secondary | ICD-10-CM

## 2023-07-30 DIAGNOSIS — E785 Hyperlipidemia, unspecified: Secondary | ICD-10-CM | POA: Diagnosis present

## 2023-07-30 DIAGNOSIS — E1159 Type 2 diabetes mellitus with other circulatory complications: Secondary | ICD-10-CM | POA: Diagnosis present

## 2023-07-30 DIAGNOSIS — E1165 Type 2 diabetes mellitus with hyperglycemia: Secondary | ICD-10-CM | POA: Diagnosis present

## 2023-07-30 DIAGNOSIS — N179 Acute kidney failure, unspecified: Secondary | ICD-10-CM | POA: Diagnosis present

## 2023-07-30 DIAGNOSIS — Z933 Colostomy status: Secondary | ICD-10-CM | POA: Diagnosis not present

## 2023-07-30 DIAGNOSIS — Z947 Corneal transplant status: Secondary | ICD-10-CM

## 2023-07-30 DIAGNOSIS — A419 Sepsis, unspecified organism: Secondary | ICD-10-CM | POA: Diagnosis present

## 2023-07-30 DIAGNOSIS — E1122 Type 2 diabetes mellitus with diabetic chronic kidney disease: Secondary | ICD-10-CM | POA: Diagnosis present

## 2023-07-30 DIAGNOSIS — I959 Hypotension, unspecified: Secondary | ICD-10-CM | POA: Diagnosis not present

## 2023-07-30 DIAGNOSIS — I10 Essential (primary) hypertension: Secondary | ICD-10-CM | POA: Diagnosis present

## 2023-07-30 DIAGNOSIS — R7989 Other specified abnormal findings of blood chemistry: Secondary | ICD-10-CM

## 2023-07-30 DIAGNOSIS — J45909 Unspecified asthma, uncomplicated: Secondary | ICD-10-CM | POA: Diagnosis present

## 2023-07-30 DIAGNOSIS — Z1152 Encounter for screening for COVID-19: Secondary | ICD-10-CM

## 2023-07-30 DIAGNOSIS — Z21 Asymptomatic human immunodeficiency virus [HIV] infection status: Secondary | ICD-10-CM | POA: Diagnosis present

## 2023-07-30 DIAGNOSIS — E875 Hyperkalemia: Secondary | ICD-10-CM | POA: Diagnosis present

## 2023-07-30 DIAGNOSIS — E039 Hypothyroidism, unspecified: Secondary | ICD-10-CM | POA: Diagnosis present

## 2023-07-30 DIAGNOSIS — Z7951 Long term (current) use of inhaled steroids: Secondary | ICD-10-CM

## 2023-07-30 DIAGNOSIS — G894 Chronic pain syndrome: Secondary | ICD-10-CM | POA: Diagnosis present

## 2023-07-30 DIAGNOSIS — I152 Hypertension secondary to endocrine disorders: Secondary | ICD-10-CM

## 2023-07-30 DIAGNOSIS — K219 Gastro-esophageal reflux disease without esophagitis: Secondary | ICD-10-CM | POA: Diagnosis present

## 2023-07-30 DIAGNOSIS — F419 Anxiety disorder, unspecified: Secondary | ICD-10-CM | POA: Diagnosis present

## 2023-07-30 DIAGNOSIS — Z79899 Other long term (current) drug therapy: Secondary | ICD-10-CM

## 2023-07-30 DIAGNOSIS — I5032 Chronic diastolic (congestive) heart failure: Secondary | ICD-10-CM | POA: Diagnosis present

## 2023-07-30 DIAGNOSIS — Z7984 Long term (current) use of oral hypoglycemic drugs: Secondary | ICD-10-CM

## 2023-07-30 DIAGNOSIS — Z880 Allergy status to penicillin: Secondary | ICD-10-CM

## 2023-07-30 DIAGNOSIS — N400 Enlarged prostate without lower urinary tract symptoms: Secondary | ICD-10-CM | POA: Diagnosis present

## 2023-07-30 DIAGNOSIS — N3090 Cystitis, unspecified without hematuria: Secondary | ICD-10-CM | POA: Diagnosis not present

## 2023-07-30 DIAGNOSIS — Z993 Dependence on wheelchair: Secondary | ICD-10-CM

## 2023-07-30 DIAGNOSIS — E1129 Type 2 diabetes mellitus with other diabetic kidney complication: Secondary | ICD-10-CM | POA: Diagnosis present

## 2023-07-30 DIAGNOSIS — F418 Other specified anxiety disorders: Secondary | ICD-10-CM | POA: Diagnosis not present

## 2023-07-30 DIAGNOSIS — R6520 Severe sepsis without septic shock: Secondary | ICD-10-CM | POA: Diagnosis present

## 2023-07-30 DIAGNOSIS — I9589 Other hypotension: Secondary | ICD-10-CM | POA: Diagnosis present

## 2023-07-30 DIAGNOSIS — K8020 Calculus of gallbladder without cholecystitis without obstruction: Secondary | ICD-10-CM | POA: Diagnosis present

## 2023-07-30 DIAGNOSIS — Z9049 Acquired absence of other specified parts of digestive tract: Secondary | ICD-10-CM

## 2023-07-30 LAB — CBC WITH DIFFERENTIAL/PLATELET
Abs Immature Granulocytes: 0.07 10*3/uL (ref 0.00–0.07)
Basophils Absolute: 0 10*3/uL (ref 0.0–0.1)
Basophils Relative: 0 %
Eosinophils Absolute: 0.1 10*3/uL (ref 0.0–0.5)
Eosinophils Relative: 1 %
HCT: 41.6 % (ref 39.0–52.0)
Hemoglobin: 13 g/dL (ref 13.0–17.0)
Immature Granulocytes: 1 %
Lymphocytes Relative: 7 %
Lymphs Abs: 0.7 10*3/uL (ref 0.7–4.0)
MCH: 26.4 pg (ref 26.0–34.0)
MCHC: 31.3 g/dL (ref 30.0–36.0)
MCV: 84.4 fL (ref 80.0–100.0)
Monocytes Absolute: 0.6 10*3/uL (ref 0.1–1.0)
Monocytes Relative: 6 %
Neutro Abs: 8.7 10*3/uL — ABNORMAL HIGH (ref 1.7–7.7)
Neutrophils Relative %: 85 %
Platelets: 259 10*3/uL (ref 150–400)
RBC: 4.93 MIL/uL (ref 4.22–5.81)
RDW: 14.7 % (ref 11.5–15.5)
WBC: 10.1 10*3/uL (ref 4.0–10.5)
nRBC: 0 % (ref 0.0–0.2)

## 2023-07-30 LAB — LACTIC ACID, PLASMA
Lactic Acid, Venous: 2.4 mmol/L (ref 0.5–1.9)
Lactic Acid, Venous: 3.2 mmol/L (ref 0.5–1.9)
Lactic Acid, Venous: 2.8 mmol/L (ref 0.5–1.9)
Lactic Acid, Venous: 1.1 mmol/L (ref 0.5–1.9)

## 2023-07-30 LAB — URINALYSIS, W/ REFLEX TO CULTURE (INFECTION SUSPECTED)
Bacteria, UA: NONE SEEN
Bilirubin Urine: NEGATIVE
Glucose, UA: NEGATIVE mg/dL
Hgb urine dipstick: NEGATIVE
Ketones, ur: NEGATIVE mg/dL
Nitrite: NEGATIVE
Protein, ur: 30 mg/dL — AB
Specific Gravity, Urine: 1.021 (ref 1.005–1.030)
WBC, UA: >50 WBC/hpf (ref 0–5)
pH: 7 (ref 5.0–8.0)

## 2023-07-30 LAB — COMPREHENSIVE METABOLIC PANEL WITH GFR
ALT: 10 U/L (ref 0–44)
AST: 14 U/L — ABNORMAL LOW (ref 15–41)
Albumin: 3.3 g/dL — ABNORMAL LOW (ref 3.5–5.0)
Alkaline Phosphatase: 60 U/L (ref 38–126)
Anion gap: 4 — ABNORMAL LOW (ref 5–15)
BUN: 19 mg/dL (ref 6–20)
CO2: 25 mmol/L (ref 22–32)
Calcium: 8.8 mg/dL — ABNORMAL LOW (ref 8.9–10.3)
Chloride: 102 mmol/L (ref 98–111)
Creatinine, Ser: 1.81 mg/dL — ABNORMAL HIGH (ref 0.61–1.24)
GFR, Estimated: 47 mL/min — ABNORMAL LOW (ref >60)
Glucose, Bld: 190 mg/dL — ABNORMAL HIGH (ref 70–99)
Potassium: 6.3 mmol/L (ref 3.5–5.1)
Sodium: 131 mmol/L — ABNORMAL LOW (ref 135–145)
Total Bilirubin: 0.8 mg/dL (ref 0.0–1.2)
Total Protein: 6.6 g/dL (ref 6.5–8.1)

## 2023-07-30 LAB — APTT: aPTT: 32 s (ref 24–36)

## 2023-07-30 LAB — RESP PANEL BY RT-PCR (RSV, FLU A&B, COVID)  RVPGX2
Influenza A by PCR: NEGATIVE
Influenza B by PCR: NEGATIVE
Resp Syncytial Virus by PCR: NEGATIVE
SARS Coronavirus 2 by RT PCR: NEGATIVE

## 2023-07-30 LAB — POTASSIUM: Potassium: 4.3 mmol/L (ref 3.5–5.1)

## 2023-07-30 LAB — PROTIME-INR
INR: 1.1 (ref 0.8–1.2)
Prothrombin Time: 14.1 s (ref 11.4–15.2)

## 2023-07-30 LAB — BRAIN NATRIURETIC PEPTIDE: B Natriuretic Peptide: 16.6 pg/mL (ref 0.0–100.0)

## 2023-07-30 MED ORDER — HYDRALAZINE HCL 20 MG/ML IJ SOLN: 5.0000 mg | INTRAMUSCULAR | Status: DC | PRN

## 2023-07-30 MED ORDER — SODIUM ZIRCONIUM CYCLOSILICATE 10 G PO PACK
10.0000 g | PACK | Freq: Once | ORAL | Status: AC
  Administered 2023-07-30: 10 g via ORAL
  Filled 2023-07-30: qty 1

## 2023-07-30 MED ORDER — LACTATED RINGERS IV SOLN: INTRAVENOUS | Status: DC

## 2023-07-30 MED ORDER — LACTATED RINGERS IV BOLUS
500.0000 mL | Freq: Once | INTRAVENOUS | Status: AC
  Administered 2023-07-30: 500 mL via INTRAVENOUS

## 2023-07-30 MED ORDER — INSULIN ASPART 100 UNIT/ML IV SOLN
10.0000 [IU] | Freq: Once | INTRAVENOUS | Status: AC
  Administered 2023-07-30: 10 [IU] via INTRAVENOUS
  Filled 2023-07-30: qty 0.1

## 2023-07-30 MED ORDER — CALCIUM GLUCONATE 10 % IV SOLN
1.0000 g | Freq: Once | INTRAVENOUS | Status: AC
  Administered 2023-07-30: 1 g via INTRAVENOUS
  Filled 2023-07-30: qty 10

## 2023-07-30 MED ORDER — LACTATED RINGERS IV BOLUS (SEPSIS)
250.0000 mL | Freq: Once | INTRAVENOUS | Status: AC
Start: 2023-07-30 — End: 2023-07-30
  Administered 2023-07-30: 250 mL via INTRAVENOUS

## 2023-07-30 MED ORDER — DEXTROSE 50 % IV SOLN
50.0000 mL | Freq: Once | INTRAVENOUS | Status: AC
  Administered 2023-07-30: 50 mL via INTRAVENOUS
  Filled 2023-07-30: qty 50

## 2023-07-30 MED ORDER — DM-GUAIFENESIN ER 30-600 MG PO TB12: 1.0000 | ORAL_TABLET | Freq: Two times a day (BID) | ORAL | Status: DC | PRN

## 2023-07-30 MED ORDER — IOHEXOL 300 MG/ML  SOLN
100.0000 mL | Freq: Once | INTRAMUSCULAR | Status: AC | PRN
  Administered 2023-07-30: 100 mL via INTRAVENOUS

## 2023-07-30 MED ORDER — LACTATED RINGERS IV BOLUS (SEPSIS)
1000.0000 mL | Freq: Once | INTRAVENOUS | Status: AC
Start: 2023-07-30 — End: 2023-07-30
  Administered 2023-07-30: 1000 mL via INTRAVENOUS

## 2023-07-30 MED ORDER — ACETAMINOPHEN 325 MG PO TABS
650.0000 mg | ORAL_TABLET | Freq: Four times a day (QID) | ORAL | Status: DC | PRN
  Administered 2023-07-30 – 2023-07-31 (×4): 650 mg via ORAL
  Filled 2023-07-30 (×4): qty 2

## 2023-07-30 MED ORDER — SODIUM CHLORIDE 0.9 % IV SOLN
1.0000 g | Freq: Two times a day (BID) | INTRAVENOUS | Status: DC
  Administered 2023-07-30 – 2023-07-31 (×2): 1 g via INTRAVENOUS
  Filled 2023-07-30 (×2): qty 20

## 2023-07-30 MED ORDER — SODIUM CHLORIDE 0.9 % IV SOLN
2.0000 g | Freq: Once | INTRAVENOUS | Status: AC
  Administered 2023-07-30: 2 g via INTRAVENOUS
  Filled 2023-07-30: qty 20

## 2023-07-30 MED ORDER — SODIUM BICARBONATE 8.4 % IV SOLN
50.0000 meq | Freq: Once | INTRAVENOUS | Status: AC
  Administered 2023-07-30: 50 meq via INTRAVENOUS
  Filled 2023-07-30: qty 50

## 2023-07-30 MED ORDER — ONDANSETRON HCL 4 MG/2ML IJ SOLN
4.0000 mg | Freq: Three times a day (TID) | INTRAMUSCULAR | Status: DC | PRN
  Administered 2023-07-31: 4 mg via INTRAVENOUS
  Filled 2023-07-30: qty 2

## 2023-07-30 MED ORDER — ALBUTEROL SULFATE (2.5 MG/3ML) 0.083% IN NEBU: 2.5000 mg | INHALATION_SOLUTION | RESPIRATORY_TRACT | Status: DC | PRN

## 2023-07-30 MED ORDER — LACTATED RINGERS IV BOLUS (SEPSIS)
1000.0000 mL | Freq: Once | INTRAVENOUS | Status: AC
  Administered 2023-07-30: 1000 mL via INTRAVENOUS

## 2023-07-30 NOTE — Sepsis Progress Note (Signed)
 Elink monitoring for the code sepsis protocol.

## 2023-07-30 NOTE — ED Triage Notes (Signed)
 Pt to ED via [facility] from home. Pt recently diagnosed with UTI and on antibiotics. Pt lethargic since this AM, Pt has hx DM, HTN, wheelchair bound at baseline. Initial BP 83/50, per EMS pt wearing a blood pressure patch which they removed. Given 500 mL NS and 300 mL LR.  Cbg 133

## 2023-07-30 NOTE — Sepsis Progress Note (Signed)
 Notified bedside nurse of need to draw repeat lactic acid.

## 2023-07-30 NOTE — ED Notes (Signed)
 Jossie, MD, made aware of potassium 6.3

## 2023-07-30 NOTE — Sepsis Progress Note (Signed)
Notified bedside nurse of need to draw repeat lactic acid (#3). 

## 2023-07-30 NOTE — ED Notes (Signed)
 Pt to CT at this time.

## 2023-07-30 NOTE — ED Provider Notes (Signed)
 [facility] Provider Note   Event Date/Time   First MD Initiated Contact with Patient 07/30/23 1421     (approximate) History  Code Sepsis  HPI Cory Campbell is a 43 y.o. male with a past medical history of hypertension, type 2 diabetes, asthma, HIV, CHF, CKD, and [facility] and wheelchair-bound who presents via EMS from home after family noticed patient being extremely lethargic since this morning.  Patient was diagnosed with urinary tract infection and placed on antibiotics.  Since that time patient states that he has had no improvement in his symptoms.  Patient does endorse suprapubic abdominal pain and dysuria ROS: Patient currently denies any vision changes, tinnitus, difficulty speaking, facial droop, sore throat, chest pain, shortness of breath, nausea/vomiting/diarrhea, or numbness/paresthesias in any extremity   Physical Exam  Triage Vital Signs: ED Triage Vitals  Encounter Vitals Group     BP 07/30/23 1423 106/72     Girls Systolic BP Percentile --      Girls Diastolic BP Percentile --      Boys Systolic BP Percentile --      Boys Diastolic BP Percentile --      Pulse Rate 07/30/23 1423 97     Resp 07/30/23 1423 (!) 24     Temp --      Temp src --      SpO2 07/30/23 1423 100 %     Weight 07/30/23 1427 146 lb (66.2 kg)     Height 07/30/23 1427 5' 6 (1.676 m)     Head Circumference --      Peak Flow --      Pain Score 07/30/23 1425 10     Pain Loc --      Pain Education --      Exclude from Growth Chart --    Most recent vital signs: Vitals:   07/30/23 1450 07/30/23 1500  BP: 103/72 96/70  Pulse: 95 96  Resp: 20 (!) 24  SpO2: 97% 98%   General: Awake, oriented x4. CV:  Good peripheral perfusion. Resp:  Normal effort. Abd:  No distention. Other:  Middle-aged well-developed, well-nourished African-American male resting comfortably in no acute distress.  Bilateral lower extremity wasting with braces in place ED Results / Procedures /  Treatments  Labs (all labs ordered are listed, but only abnormal results are displayed) Labs Reviewed  CBC WITH DIFFERENTIAL/PLATELET - Abnormal; Notable for the following components:      Result Value   Neutro Abs 8.7 (*)    All other components within normal limits  RESP PANEL BY RT-PCR (RSV, FLU A&B, COVID)  RVPGX2  CULTURE, BLOOD (ROUTINE X 2)  CULTURE, BLOOD (ROUTINE X 2)  PROTIME-INR  LACTIC ACID, PLASMA  LACTIC ACID, PLASMA  COMPREHENSIVE METABOLIC PANEL WITH GFR  URINALYSIS, W/ REFLEX TO CULTURE (INFECTION SUSPECTED)   EKG ED ECG REPORT I, Artist MARLA Kerns, the attending physician, personally viewed and interpreted this ECG. Date: 07/30/2023 EKG Time: 1421 Rate: 96 Rhythm: normal sinus rhythm QRS Axis: normal Intervals: normal ST/T Wave abnormalities: normal Narrative Interpretation: no evidence of acute ischemia RADIOLOGY ED MD interpretation: One-view portable chest x-ray interpreted by me shows no evidence of acute abnormalities including no pneumonia, pneumothorax, or widened mediastinum - All radiology independently interpreted and agree with radiology assessment Official radiology report(s): DG Chest Port 1 View Result Date: 07/30/2023 CLINICAL DATA:  Concern for sepsis. EXAM: PORTABLE CHEST 1 VIEW COMPARISON:  04/07/2022. FINDINGS: Low lung volumes. Patient is rotated to the  right. The heart size and mediastinal contours are otherwise within normal limits. Both lungs are clear. No pleural effusion or pneumothorax. No acute osseous abnormality. IMPRESSION: Low lung volumes. No acute cardiopulmonary findings Electronically Signed   By: Harrietta Sherry M.D.   On: 07/30/2023 15:16   PROCEDURES: Critical Care performed: No Procedures MEDICATIONS ORDERED IN ED: Medications  lactated ringers  infusion (has no administration in time range)  lactated ringers  bolus 1,000 mL (1,000 mLs Intravenous New Bag/Given 07/30/23 1446)    And  lactated ringers  bolus 1,000 mL (has no  administration in time range)    And  lactated ringers  bolus 250 mL (has no administration in time range)  cefTRIAXone  (ROCEPHIN ) 2 g in sodium chloride  0.9 % 100 mL IVPB (2 g Intravenous New Bag/Given 07/30/23 1448)   IMPRESSION / MDM / ASSESSMENT AND PLAN / ED COURSE  I reviewed the triage vital signs and the nursing notes.                             The patient is on the cardiac monitor to evaluate for evidence of arrhythmia and/or significant heart rate changes. Patient's presentation is most consistent with acute presentation with potential threat to life or bodily function. The Pt presents with left CVA pain suprapubic abdominal pain highly concerning for sepsis (suspected urinary source). At this time, the Pt is satting well on room air, normotensive, and appears HDS.  Will start empiric antibiotics and fluids.  Due to history of heart failure, will administer fluids gradually with frequent reassessment. Have low suspicion for a GI, skin/soft tissue, or CNS source at this time, but will reconsider if initial workup is unremarkable.  - CBC, BMP, LFTs - VBG - UA - BCx x2, Lactate - EKG - CXR - CT abdomen/pelvis - Empiric Abx: Rocephin  - Fluids: 30cc/kg lactated Ringer 's  Dispo: Care of this patient will be signed out to the oncoming physician at the end of my shift.  All pertinent patient information conveyed and all questions answered.  All further care and disposition decisions will be made by the oncoming physician.   FINAL CLINICAL IMPRESSION(S) / ED DIAGNOSES   Final diagnoses:  Lethargy   Rx / DC Orders   ED Discharge Orders     None      Note:  This document was prepared using Dragon voice recognition software and may include unintentional dictation errors.   Jossie Artist POUR, MD 07/30/23 [phone]

## 2023-07-30 NOTE — ED Notes (Signed)
 Jossie, MD, made aware of lactic 2.4

## 2023-07-30 NOTE — H&P (Signed)
 History and Physical    Cory Campbell FMW:[id number] DOB: 1980-03-19 DOA: 07/30/2023  Referring MD/NP/PA:   PCP: Lorel Maxie LABOR, MD   Patient coming from:  The patient is coming from home.     Chief Complaint: Increased urinary frequency, fever, lower back pain  HPI: Cory Campbell is a 43 y.o. male with medical history significant of arthrogryposis multiplex congenita, wheelchair-bound, s/p of partial colectomy, diverticulitis, s/p of corneal transplantation, hypertension, hyperlipidemia, diabetes mellitus, asthma, hypothyroidism, depression with anxiety, remote seizure not on any medication, HIV, chronic pain syndrome.  BPH, cCHF,  CKD-3a, who presents with increased urinary frequency, fever, lower back pain.  Pt states that he has increased urinary frequency and lower back pain in the past several days. Ht has fever, his temperature is 101.2 in ED. Per report from EMS, family noticed patient being extremely lethargic since this morning. Patient was diagnosed with urinary tract infection and placed on antibiotics recently.  Pt reported to ED physician that he has suprapubic abdominal pain with dysuria, but denies these symptoms to me.  No hematuria.  Patient does not have chest pain, cough, SOB.  No nausea, vomiting, diarrhea or abdominal pain. When I saw patient in ED, he is lethargic, but oriented x 3.  He answered all questions appropriately.   Per report, pt had hypotension with blood pressure 83/50. Per EMS pt was wearing a patch which they removed. Pt  was given 500 mL NS and 300 mL LR by EMS. His Bp is 94/71 in ED which further improved to 122/75 after giving 2.25 L of LR bolus in ED.   Data reviewed independently and ED Course: pt was found to have WBC 10.1, UA (hazy appearance, large amount of leukocyte, negative bacteria, WBC> 50), potassium 6.3, worsening renal function, negative PCR for COVID, flu and RSV, temperature one 1.2, heart rate 96, RR 22, oxygen saturation 98% on  room air.  Chest x-ray negative.  Patient is admitted to telemetry bed as inpatient.  Dr. Twylla of urology is consulted.  CT scan of abdomen/pelvis: 1. New fluid density lesions with enhancing margins in the prostate gland bilaterally, concerning for prostatic abscesses. 2. Diffuse wall thickening of the urinary bladder, suspicious for cystitis. 3. Edema in the left abdominal small bowel mesentery, nonspecific but potentially a manifestation of enteritis. 4. Circumferential wall thickening of the rectum as shown on prior exams including 03/05/2022, presumably from chronic inflammation. 5. Cholelithiasis. 6. Prominent muscular atrophy involving the pelvic musculature and upper thigh musculature. 7. Severely dysplastic and chronically dislocated hips.    EKG: I have personally reviewed.  Sinus rhythm, QTc 412, LAE, Q waves in inferior leads.   Review of Systems:   General: has fevers, chills, no body weight gain, has fatigue HEENT: no blurry vision, hearing changes or sore throat Respiratory: no dyspnea, coughing, wheezing CV: no chest pain, no palpitations GI: no nausea, vomiting, abdominal pain, diarrhea, constipation GU: no dysuria, burning on urination, has increased urinary frequency, no  hematuria  Ext: no leg edema Neuro: no unilateral weakness, numbness, or tingling, no vision change or hearing loss. Has lethargy Skin: no rash, no skin tear. MSK: Has severe muscular dystrophy in both legs . Has lower back pain Heme: No easy bruising.  Travel history: No recent long distant travel.   Allergy:  Allergies  Allergen Reactions   Penicillins Hives, Itching and Rash    Has patient had a PCN reaction causing immediate rash, facial/tongue/throat swelling, SOB or lightheadedness with hypotension: Yes  Has patient had a PCN reaction causing severe rash involving mucus membranes or skin necrosis: No  Has patient had a PCN reaction that required hospitalization No  Has patient  had a PCN reaction occurring within the last 10 years: No  If all of the above answers are NO, then may proceed with Cephalosporin use.  Has tolerated amoxicillin  without problems  Has patient had a PCN reaction causing immediate rash, facial/tongue/throat swelling, SOB or lightheadedness with hypotension: Yes    Has patient had a PCN reaction causing severe rash involving mucus membranes or skin necrosis: No    Has patient had a PCN reaction that required hospitalization No    Has patient had a PCN reaction occurring within the last 10 years: No    If all of the above answers are NO, then may proceed with Cephalosporin use.    Has patient had a PCN reaction causing immediate rash, facial/tongue/throat swelling, SOB or lightheadedness with hypotension: Yes Has patient had a PCN reaction causing severe rash involving mucus membranes or skin necrosis: No Has patient had a PCN reaction that required hospitalization No Has patient had a PCN reaction occurring within the last 10 years: No If all of the above answers are NO, then may proceed with Cephalosporin use.  Has tolerated amoxicillin  without problems   Erythromycin Base Itching and Rash    Past Medical History:  Diagnosis Date   (HFpEF) heart failure with preserved ejection fraction (HCC)    Acid reflux    Adopted    [facility] (arthrogryposis multiplex congenita)    wheelchair bound because of this   Anxiety    Arthritis    Arthrogryposis    Asthma    BPH (benign prostatic hyperplasia)    Bronchitis    CHF (congestive heart failure) (HCC)    Chronic pain syndrome    Depressive disorder, not elsewhere classified    Diabetes mellitus without complication (HCC)    Diverticulitis of colon (without mention of hemorrhage)(562.11)    ED (erectile dysfunction)    Herpes genitalis    HIV infection (HCC)    HTN (hypertension)    Hyperlipidemia    Hypertensive cardiomyopathy (HCC)    Hypogonadism in male    OAB (overactive  bladder)    Other psoriasis    Renal disorder    Seizure disorder (HCC)    Sleep apnea    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 or other cause, without mention of thyrotoxic crisis or storm    hyperthyroidism    Past Surgical History:  Procedure Laterality Date   APPENDECTOMY     COLOSTOMY     CORNEAL TRANSPLANT     DIALYSIS/PERMA CATHETER INSERTION Right 03/10/2022   Procedure: DIALYSIS/PERMA CATHETER INSERTION;  Surgeon: Jama Cordella MATSU, MD;  Location: [facility];  Service: Cardiovascular;  Laterality: Right;   DIALYSIS/PERMA CATHETER REMOVAL N/A 04/18/2022   Procedure: DIALYSIS/PERMA CATHETER REMOVAL;  Surgeon: Jama Cordella MATSU, MD;  Location: [facility];  Service: Cardiovascular;  Laterality: N/A;   feet surgery     both   HAND SURGERY     left and right   leg surgery     left and right   ORTHOPEDIC SURGERY     hands, feet, knees, legs   tubes in ears     both    Social History:  reports that he quit smoking about 5 years ago. His smoking use included pipe, cigars, and e-cigarettes. He has never used smokeless tobacco. He  reports that he does not currently use alcohol after a past usage of about 3.0 standard drinks of alcohol per week. He reports that he does not use drugs.  Family History:  Family History  Adopted: Yes  Family history unknown: Yes     Prior to Admission medications   Medication Sig Start Date End Date Taking? Authorizing Provider  acetaminophen  (TYLENOL ) 500 MG tablet Take by mouth. 01/10/23   [provider]  acyclovir  (ZOVIRAX ) 400 MG tablet Take by mouth.    [provider]  albuterol  (PROVENTIL  HFA;VENTOLIN  HFA) 108 (90 BASE) MCG/ACT inhaler Inhale 2 puffs into the lungs every 4 (four) hours as needed for wheezing or shortness of breath.     [provider]  albuterol  (PROVENTIL ) (2.5 MG/3ML) 0.083% nebulizer solution Take 2.5 mg by nebulization every 4 (four) hours as needed for wheezing or  shortness of breath.    [provider]  amLODipine  (NORVASC ) 10 MG tablet Take 10 mg by mouth daily.    [provider]  BIKTARVY  50-200-25 MG TABS tablet Take 1 tablet by mouth daily. 11/21/16   [provider]  buprenorphine  (BUTRANS ) 7.5 MCG/HR Place 7.5 mg onto the skin once a week. Saturday    [provider]  buPROPion  (WELLBUTRIN  XL) 150 MG 24 hr tablet Take 150 mg by mouth daily. 08/11/21   [provider]  CABENUVA 600 & 900 MG/3ML injection INJECT A TOTAL OF 6 ML IN THE MUSCLE ONCE A MONTH FOR 2 MONTHS THEN USE EVERY 2 MONTHS THEREAFTER. 06/07/22   [provider]  cetirizine (ZYRTEC) 10 MG tablet Take 10 mg by mouth daily.    [provider]  chlorthalidone  (HYGROTON ) 25 MG tablet Take 25 mg by mouth daily. 12/04/22   [provider]  citalopram  (CELEXA ) 20 MG tablet Take 20 mg by mouth daily.    [provider]  Continuous Glucose Receiver (DEXCOM G7 RECEIVER) DEVI Dispense one 12/27/22   [provider]  Continuous Glucose Sensor (DEXCOM G7 SENSOR) MISC Use every 10 days. 11/13/22   [provider]  cyclobenzaprine  (FLEXERIL ) 5 MG tablet Take 5 mg by mouth 3 (three) times daily as needed. 07/11/22   [provider]  diclofenac  sodium (VOLTAREN ) 1 % GEL Apply 2-4 g topically 4 (four) times daily as needed. For pain.    [provider]  dicyclomine  (BENTYL ) 10 MG capsule Take 20 mg by mouth 2 (two) times daily before a meal. 05/01/21   [provider]  Elastic Bandages & Supports (T.E.D. BELOW KNEE/MEDIUM) MISC 1 each by Does not apply route daily. Measure legs and give appropriate size. 09/12/21   Rizwan, Saima, MD  famotidine  (PEPCID ) 20 MG tablet Take 20 mg by mouth daily.  01/03/16   [provider]  feeding supplement, GLUCERNA SHAKE, (GLUCERNA SHAKE) LIQD Take 237 mLs by mouth 4 (four) times daily.    [provider]  ferrous sulfate 325 (65 FE) MG  tablet Take 325 mg by mouth daily with breakfast.    [provider]  finasteride  (PROSCAR ) 5 MG tablet Take 5 mg by mouth daily.    [provider]  fluticasone  (FLONASE ) 50 MCG/ACT nasal spray Place 2 sprays into both nostrils daily. 07/04/22   Van Knee, MD  fluticasone  (FLOVENT  HFA) 110 MCG/ACT inhaler Inhale 2 puffs into the lungs 2 (two) times daily.    [provider]  gabapentin  (NEURONTIN ) 300 MG capsule Take 1 capsule (300 mg total) by mouth  2 (two) times daily. Patient taking differently: Take 300 mg by mouth 3 (three) times daily. 09/12/21 01/29/23  Rizwan, Saima, MD  glucose 4 GM chewable tablet Chew 1 tablet by mouth as needed for low blood sugar.    [provider]  hydrALAZINE  (APRESOLINE ) 50 MG tablet Take 1 tablet (50 mg total) by mouth 3 (three) times daily. Patient taking differently: Take 25 mg by mouth 3 (three) times daily. 03/16/22   Leotis Bogus, MD  insulin  degludec (TRESIBA ) 100 UNIT/ML FlexTouch Pen Inject 24 Units into the skin 2 (two) times daily. 10/22/22   Lenon Marien CROME, MD  isosorbide  dinitrate (ISORDIL ) 20 MG tablet Take 1 tablet (20 mg total) by mouth 3 (three) times daily. 03/16/22   Leotis Bogus, MD  ketoconazole  (NIZORAL ) 2 % shampoo Apply topically. 12/18/22   [provider]  lidocaine  (LIDODERM ) 5 % 2 patches daily.    [provider]  lisinopril  (ZESTRIL ) 10 MG tablet Take 1 tablet by mouth daily. 12/27/22 12/27/23  [provider]  Melatonin 5 MG TABS Take 10 mg by mouth at bedtime as needed (sleep).    [provider]  metoprolol  succinate (TOPROL -XL) 100 MG 24 hr tablet TAKE 1 TABLET(100 MG) BY MOUTH TWICE DAILY WITH OR IMMEDIATELY FOLLOWING A MEAL 04/30/23   Gollan, Timothy J, MD  mirtazapine  (REMERON ) 45 MG tablet Take 45 mg by mouth at bedtime.    [provider]  Multiple Vitamin (MULTIVITAMIN) tablet Take 1 tablet by mouth daily.    [provider]   nitroGLYCERIN  (NITROSTAT ) 0.4 MG SL tablet PLACE 1 TABLET UNDER THE TONGUE EVERY 5 MINUTES AS NEEDED FOR CHEST PAIN. CALL 911 IF YOU HAVE TAKEN 3 TABLETS FOR CHEST PAIN 08/03/22   Gollan, Timothy J, MD  nortriptyline  (PAMELOR ) 25 MG capsule Take 50 mg by mouth at bedtime.    [provider]  nystatin  (MYCOSTATIN ) 100000 UNIT/ML suspension Swish 5ml and retain in mouth for several seconds QID x 7 days 06/03/23   Arvis Huxley B, PA-C  omega-3 acid ethyl esters (LOVAZA ) 1 g capsule Take 1 g by mouth 3 (three) times daily.    [provider]  omeprazole (PRILOSEC) 20 MG capsule Take 20 mg by mouth daily. 09/08/21   [provider]  ondansetron  (ZOFRAN -ODT) 4 MG disintegrating tablet Take 4 mg by mouth every 8 (eight) hours as needed for nausea or vomiting.    [provider]  polyethylene glycol (MIRALAX  / GLYCOLAX ) 17 g packet Take 17 g by mouth 3 (three) times daily.    [provider]  potassium chloride  SA (KLOR-CON  M) 20 MEQ tablet Take 1 tablet (20 mEq total) by mouth daily for 7 days. 10/22/22 01/29/23  Lenon Marien CROME, MD  promethazine -dextromethorphan  (PROMETHAZINE -DM) 6.25-15 MG/5ML syrup Take 5 mLs by mouth 4 (four) times daily as needed. 06/03/23   Arvis Huxley B, PA-C  rosuvastatin  (CRESTOR ) 40 MG tablet TAKE 1 TABLET(40 MG) BY MOUTH DAILY 04/30/19   Gollan, Timothy J, MD  solifenacin (VESICARE) 10 MG tablet Take 10 mg by mouth daily.    [provider]  Spacer/Aero-Holding Chambers (AEROCHAMBER PLUS) inhaler Use as instructed 03/23/18   Van Knee, MD  sulfamethoxazole -trimethoprim  (BACTRIM  DS) 800-160 MG tablet Take 1 tablet by mouth 2 (two) times daily for 7 days. 07/27/23 08/03/23  Defelice, Rilla, NP  TRULICITY 4.5 MG/0.5ML SOPN SMARTSIG:4.5 Milligram(s) SUB-Q Once a Week 09/23/21   [provider]    Physical Exam: Vitals:   07/30/23 1942  07/30/23 1955 07/30/23 2101 07/30/23 2125  BP:    127/84  Pulse:    95   Resp:    20  Temp: 98.3 F (36.8 C) (!) 101.2 F (38.4 C) (!) 100.9 F (38.3 C) (!) 100.7 F (38.2 C)  TempSrc:  Oral Oral Oral  SpO2:    100%  Weight:      Height:       General: Not in acute distress.  Dry mucosal membrane HEENT:       Eyes: PERRL, EOMI, no jaundice       ENT: No discharge from the ears and nose, no pharynx injection, no tonsillar enlargement.        Neck: No JVD, no bruit, no mass felt. Heme: No neck lymph node enlargement. Cardiac: S1/S2, RRR, No murmurs, No gallops or rubs. Respiratory: No rales, wheezing, rhonchi or rubs. GI: Soft, nondistended, nontender, no rebound pain, no organomegaly, BS present. GU: No hematuria Ext: No pitting leg edema bilaterally. 1+DP/PT pulse bilaterally. Musculoskeletal: Has severe muscular dystrophy in both legs  Skin: No rashes.  Neuro: Lethargic, oriented X3, cranial nerves II-XII grossly intact. Psych: Patient is not psychotic, no suicidal or hemocidal ideation.  Labs on Admission: I have personally reviewed following labs and imaging studies  CBC: Recent Labs  Lab 07/30/23 1437  WBC 10.1  NEUTROABS 8.7*  HGB 13.0  HCT 41.6  MCV 84.4  PLT 259   Basic Metabolic Panel: Recent Labs  Lab 07/30/23 1437 07/30/23 2137  NA 131*  --   K 6.3* 4.3  CL 102  --   CO2 25  --   GLUCOSE 190*  --   BUN 19  --   CREATININE 1.81*  --   CALCIUM  8.8*  --    GFR: Estimated Creatinine Clearance: 47.5 mL/min (A) (by C-G formula based on SCr of 1.81 mg/dL (H)). Liver Function Tests: Recent Labs  Lab 07/30/23 1437  AST 14*  ALT 10  ALKPHOS 60  BILITOT 0.8  PROT 6.6  ALBUMIN 3.3*   No results for input(s): LIPASE, AMYLASE in the last 168 hours. No results for input(s): AMMONIA in the last 168 hours. Coagulation Profile: Recent Labs  Lab 07/30/23 1437  INR 1.1   Cardiac Enzymes: No results for input(s): CKTOTAL, CKMB, CKMBINDEX, TROPONINI in the last 168 hours. BNP (last 3 results) No results for  input(s): PROBNP in the last 8760 hours. HbA1C: No results for input(s): HGBA1C in the last 72 hours. CBG: Recent Labs  Lab 07/27/23 1426  GLUCAP 157*   Lipid Profile: No results for input(s): CHOL, HDL, LDLCALC, TRIG, CHOLHDL, LDLDIRECT in the last 72 hours. Thyroid Function Tests: No results for input(s): TSH, T4TOTAL, FREET4, T3FREE, THYROIDAB in the last 72 hours. Anemia Panel: No results for input(s): VITAMINB12, FOLATE, FERRITIN, TIBC, IRON , RETICCTPCT in the last 72 hours. Urine analysis:    Component Value Date/Time   COLORURINE YELLOW (A) 07/30/2023 1728   APPEARANCEUR HAZY (A) 07/30/2023 1728   APPEARANCEUR Cloudy 05/16/2014 0350   LABSPEC 1.021 07/30/2023 1728   LABSPEC 1.018 05/16/2014 0350   PHURINE 7.0 07/30/2023 1728   GLUCOSEU NEGATIVE 07/30/2023 1728   GLUCOSEU Negative 05/16/2014 0350   HGBUR NEGATIVE 07/30/2023 1728   BILIRUBINUR NEGATIVE 07/30/2023 1728   BILIRUBINUR Negative 05/16/2014 0350   KETONESUR NEGATIVE 07/30/2023 1728   PROTEINUR 30 (A) 07/30/2023 1728   NITRITE NEGATIVE 07/30/2023 1728   LEUKOCYTESUR LARGE (A) 07/30/2023 1728   LEUKOCYTESUR 1+ 05/16/2014 0350   Sepsis  Labs: @LABRCNTIP (procalcitonin:4,lacticidven:4) ) Recent Results (from the past 240 hours)  Urine Culture     Status: Abnormal   Collection Time: 07/27/23  1:46 PM   Specimen: Urine, Random  Result Value Ref Range Status   Specimen Description   Final    URINE, RANDOM Performed at [facility], [address], [place], [place] [address]    Special Requests   Final    NONE Reflexed from [phone] [id number] Performed at [facility], [address] [place], [place] [address]    Culture >=100,000 COLONIES/mL STAPHYLOCOCCUS AUREUS (A)  Final   Report Status 07/29/2023 FINAL  Final   Organism ID, Bacteria STAPHYLOCOCCUS AUREUS (A)  Final      Susceptibility   Staphylococcus aureus - MIC*    CIPROFLOXACIN  <=0.5 SENSITIVE  Sensitive     GENTAMICIN <=0.5 SENSITIVE Sensitive     NITROFURANTOIN  <=16 SENSITIVE Sensitive     OXACILLIN 0.5 SENSITIVE Sensitive     TETRACYCLINE <=1 SENSITIVE Sensitive     VANCOMYCIN  1 SENSITIVE Sensitive     TRIMETH /SULFA  <=10 SENSITIVE Sensitive     RIFAMPIN <=0.5 SENSITIVE Sensitive     Inducible Clindamycin  NEGATIVE Sensitive     LINEZOLID  2 SENSITIVE Sensitive     * >=100,000 COLONIES/mL STAPHYLOCOCCUS AUREUS  Resp panel by RT-PCR (RSV, Flu A&B, Covid) Anterior Nasal Swab     Status: None   Collection Time: 07/30/23  2:51 PM   Specimen: Anterior Nasal Swab  Result Value Ref Range Status   SARS Coronavirus 2 by RT PCR NEGATIVE NEGATIVE Final    Comment: (NOTE) SARS-CoV-2 target nucleic acids are NOT DETECTED.  The SARS-CoV-2 RNA is generally detectable in upper respiratory specimens during the acute phase of infection. The lowest concentration of SARS-CoV-2 viral copies this assay can detect is 138 copies/mL. A negative result does not preclude SARS-Cov-2 infection and should not be used as the sole basis for treatment or other patient management decisions. A negative result may occur with  improper specimen collection/handling, submission of specimen other than nasopharyngeal swab, presence of viral mutation(s) within the areas targeted by this assay, and inadequate number of viral copies(<138 copies/mL). A negative result must be combined with clinical observations, patient history, and epidemiological information. The expected result is Negative.  Fact Sheet for Patients:  BloggerCourse.com  Fact Sheet for Healthcare Providers:  SeriousBroker.it  This test is no t yet approved or cleared by the [place]  FDA and  has been authorized for detection and/or diagnosis of SARS-CoV-2 by FDA under an Emergency Use Authorization (EUA). This EUA will remain  in effect (meaning this test can be used) for the duration of  the COVID-19 declaration under Section 564(b)(1) of the Act, 21 U.S.C.section 360bbb-3(b)(1), unless the authorization is terminated  or revoked sooner.       Influenza A by PCR NEGATIVE NEGATIVE Final   Influenza B by PCR NEGATIVE NEGATIVE Final    Comment: (NOTE) The Xpert Xpress SARS-CoV-2/FLU/RSV plus assay is intended as an aid in the diagnosis of influenza from Nasopharyngeal swab specimens and should not be used as a sole basis for treatment. Nasal washings and aspirates are unacceptable for Xpert Xpress SARS-CoV-2/FLU/RSV testing.  Fact Sheet for Patients: BloggerCourse.com  Fact Sheet for Healthcare Providers: SeriousBroker.it  This test is not yet approved or cleared by the [place]  FDA and has been authorized for detection and/or diagnosis of SARS-CoV-2 by FDA under an Emergency Use Authorization (EUA). This EUA will remain in effect (meaning  this test can be used) for the duration of the COVID-19 declaration under Section 564(b)(1) of the Act, 21 U.S.C. section 360bbb-3(b)(1), unless the authorization is terminated or revoked.     Resp Syncytial Virus by PCR NEGATIVE NEGATIVE Final    Comment: (NOTE) Fact Sheet for Patients: BloggerCourse.com  Fact Sheet for Healthcare Providers: SeriousBroker.it  This test is not yet approved or cleared by the [place]  FDA and has been authorized for detection and/or diagnosis of SARS-CoV-2 by FDA under an Emergency Use Authorization (EUA). This EUA will remain in effect (meaning this test can be used) for the duration of the COVID-19 declaration under Section 564(b)(1) of the Act, 21 U.S.C. section 360bbb-3(b)(1), unless the authorization is terminated or revoked.  Performed at [facility], [address] [place], [place] [address]      Radiological Exams on Admission:   Assessment/Plan Principal  Problem:   Prostatic abscess Active Problems:   Severe sepsis (HCC)   Hypotension   Hyperkalemia   Acute renal failure superimposed on stage 3a chronic kidney disease (HCC)   Hypertension associated with diabetes (HCC)   HLD (hyperlipidemia)   Type II diabetes mellitus with renal manifestations (HCC)   Chronic pain syndrome   Asthma   HIV (human immunodeficiency virus infection) (HCC)   BPH (benign prostatic hyperplasia)   Chronic diastolic CHF (congestive heart failure) (HCC)   Depression with anxiety   Assessment and Plan:  Severe sepsis due prostatic abscess: pt meets criteria for severe sepsis with fever 101.2, heart rate of 96, RR 22, lactic acid 2.4 --> 3.2.  Initially hypotensive which responded to IV fluid resuscitation.  Consulted Dr. Twylla of urology.  -will admitted to telemetry bed as inpatient -Started IV  meropenem (Patient received 1 dose of Rocephin  in ED). - Follow-up blood culture and urine culture - IV fluid: Total 2.75 L of LR, then 75 cc/h - trend lactic acid levels  Hypotension: due to severe sepsis -IV fluid as above - Hold all blood pressure medications  Hyperkalemia: Potassium 6.3.  - Patient was treated with 1 g calcium  gluconate, sodium bicarbonate , NovoLog  10 unit, D50, 10 g Lokelma  - The repeated potassium is 4.3  Acute renal failure superimposed on stage 3a chronic kidney disease (HCC): Recent creatinine 1.28 on 10/22/2022.  His creatinine is 1.81, BUN 19, GFR 47.  Likely due to ongoing infection, ATN is also possible due to hypotension -IV fluid as above -Avoid using all renal toxic medications.-  Hypertension associated with diabetes (HCC) -Hold all blood pressure medications: Amlodipine , hydralazine , Imdur , lisinopril , hygroton , metoprolol   HLD (hyperlipidemia) -Crestor   Type II diabetes mellitus with renal manifestations [facility]): Recent A1c 8.1, poorly controlled.  Patient taking Trulicity, Tresiba  24 units twice daily -SSI - Tresiba  16  units twice daily  Chronic pain syndrome -Continue home buprenorphine  patch, gabapentin   Asthma -Bronchodilators and as needed Mucinex   HIV (human immunodeficiency virus infection) (HCC): CD4 was 232 on 05/16/17 -Continue clear Biktarvy  - Patient is also on Cbenuva injection  BPH (benign prostatic hyperplasia) -Proscar  and  Chronic diastolic CHF (congestive heart failure) (HCC): 2D echo 10/08/2016 showed EF 50-55%.  Patient is clinically dry.  BNP 16.6. - Watch volume status closely  Depression with anxiety -Continue home medications    DVT ppx: SQ Heparin        Code Status: Full code     Family Communication:     not done, no family member is at bed side.         Disposition  Plan:  Anticipate discharge back to previous environment  Consults called:  Dr. Twylla of urology is consulted.   Admission status and Level of care: Telemetry Medical:  as inpt        Dispo: The patient is from: Home              Anticipated d/c is to: Home              Anticipated d/c date is: 2 days              Patient currently is not medically stable to d/c.    Severity of Illness:  The appropriate patient status for this patient is INPATIENT. Inpatient status is judged to be reasonable and necessary in order to provide the required intensity of service to ensure the patient's safety. The patient's presenting symptoms, physical exam findings, and initial radiographic and laboratory data in the context of their chronic comorbidities is felt to place them at high risk for further clinical deterioration. Furthermore, it is not anticipated that the patient will be medically stable for discharge from the hospital within 2 midnights of admission.   * I certify that at the point of admission it is my clinical judgment that the patient will require inpatient hospital care spanning beyond 2 midnights from the point of admission due to high intensity of service, high risk for further deterioration and  high frequency of surveillance required.*       Date of Service 07/31/2023    [facility]   If 7PM-7AM, please contact night-coverage [email or web address] 07/31/2023, 12:34 AM

## 2023-07-30 NOTE — Consult Note (Signed)
 CODE SEPSIS - PHARMACY COMMUNICATION  **Broad Spectrum Antibiotics should be administered within 1 hour of Sepsis diagnosis**  Time Code Sepsis Called/Page Received: 1437  Antibiotics Ordered: ceftriaxone   Time of 1st antibiotic administration: 1448  Additional action taken by pharmacy: n/a  If necessary, Name of Provider/Nurse Contacted: n/a    Punam Broussard A Yides Saidi ,PharmD Clinical Pharmacist  07/30/2023  2:38 PM

## 2023-07-31 DIAGNOSIS — A419 Sepsis, unspecified organism: Secondary | ICD-10-CM

## 2023-07-31 DIAGNOSIS — N412 Abscess of prostate: Secondary | ICD-10-CM | POA: Diagnosis not present

## 2023-07-31 LAB — URINE CULTURE: Culture: <10000 — AB

## 2023-07-31 LAB — CBC
HCT: 38 % — ABNORMAL LOW (ref 39.0–52.0)
Hemoglobin: 12.6 g/dL — ABNORMAL LOW (ref 13.0–17.0)
MCH: 27 pg (ref 26.0–34.0)
MCHC: 33.2 g/dL (ref 30.0–36.0)
MCV: 81.5 fL (ref 80.0–100.0)
Platelets: 223 10*3/uL (ref 150–400)
RBC: 4.66 MIL/uL (ref 4.22–5.81)
RDW: 14.6 % (ref 11.5–15.5)
WBC: 6.3 10*3/uL (ref 4.0–10.5)
nRBC: 0 % (ref 0.0–0.2)

## 2023-07-31 LAB — BASIC METABOLIC PANEL WITH GFR
Anion gap: 10 (ref 5–15)
BUN: 14 mg/dL (ref 6–20)
CO2: 24 mmol/L (ref 22–32)
Calcium: 8.8 mg/dL — ABNORMAL LOW (ref 8.9–10.3)
Chloride: 99 mmol/L (ref 98–111)
Creatinine, Ser: 1.41 mg/dL — ABNORMAL HIGH (ref 0.61–1.24)
GFR, Estimated: >60 mL/min (ref >60)
Glucose, Bld: 159 mg/dL — ABNORMAL HIGH (ref 70–99)
Potassium: 4.4 mmol/L (ref 3.5–5.1)
Sodium: 133 mmol/L — ABNORMAL LOW (ref 135–145)

## 2023-07-31 LAB — GLUCOSE, CAPILLARY
Glucose-Capillary: 132 mg/dL — ABNORMAL HIGH (ref 70–99)
Glucose-Capillary: 133 mg/dL — ABNORMAL HIGH (ref 70–99)
Glucose-Capillary: 144 mg/dL — ABNORMAL HIGH (ref 70–99)
Glucose-Capillary: 161 mg/dL — ABNORMAL HIGH (ref 70–99)

## 2023-07-31 LAB — LACTIC ACID, PLASMA: Lactic Acid, Venous: 1.2 mmol/L (ref 0.5–1.9)

## 2023-07-31 MED ORDER — SODIUM CHLORIDE 0.9 % IV SOLN
1.0000 g | Freq: Three times a day (TID) | INTRAVENOUS | Status: DC
  Filled 2023-07-31: qty 20

## 2023-07-31 MED ORDER — ROSUVASTATIN CALCIUM 10 MG PO TABS
40.0000 mg | ORAL_TABLET | Freq: Every day | ORAL | Status: DC
  Administered 2023-07-31 – 2023-08-02 (×3): 40 mg via ORAL
  Filled 2023-07-31 (×3): qty 4

## 2023-07-31 MED ORDER — MIRTAZAPINE 15 MG PO TABS
45.0000 mg | ORAL_TABLET | Freq: Every day | ORAL | Status: DC
  Administered 2023-08-01: 45 mg via ORAL
  Filled 2023-07-31: qty 3

## 2023-07-31 MED ORDER — INSULIN ASPART 100 UNIT/ML IJ SOLN: 0.0000 [IU] | Freq: Every day | INTRAMUSCULAR | Status: DC

## 2023-07-31 MED ORDER — CEFAZOLIN SODIUM-DEXTROSE 2-4 GM/100ML-% IV SOLN
2.0000 g | Freq: Three times a day (TID) | INTRAVENOUS | Status: DC
  Administered 2023-07-31 – 2023-08-02 (×6): 2 g via INTRAVENOUS
  Filled 2023-07-31 (×8): qty 100

## 2023-07-31 MED ORDER — BUPRENORPHINE 7.5 MCG/HR TD PTWK: 1.0000 | MEDICATED_PATCH | TRANSDERMAL | Status: DC

## 2023-07-31 MED ORDER — BICTEGRAVIR-EMTRICITAB-TENOFOV 50-200-25 MG PO TABS: 1.0000 | ORAL_TABLET | Freq: Every day | ORAL | Status: DC

## 2023-07-31 MED ORDER — HEPARIN SODIUM (PORCINE) 5000 UNIT/ML IJ SOLN
5000.0000 [IU] | Freq: Three times a day (TID) | INTRAMUSCULAR | Status: DC
  Administered 2023-07-31 – 2023-08-02 (×7): 5000 [IU] via SUBCUTANEOUS
  Filled 2023-07-31 (×7): qty 1

## 2023-07-31 MED ORDER — FESOTERODINE FUMARATE ER 4 MG PO TB24
4.0000 mg | ORAL_TABLET | Freq: Every day | ORAL | Status: DC
  Administered 2023-08-01 – 2023-08-02 (×2): 4 mg via ORAL
  Filled 2023-07-31 (×2): qty 1

## 2023-07-31 MED ORDER — GABAPENTIN 300 MG PO CAPS
300.0000 mg | ORAL_CAPSULE | Freq: Three times a day (TID) | ORAL | Status: DC
  Administered 2023-07-31 – 2023-08-02 (×7): 300 mg via ORAL
  Filled 2023-07-31 (×7): qty 1

## 2023-07-31 MED ORDER — PANTOPRAZOLE SODIUM 40 MG PO TBEC
40.0000 mg | DELAYED_RELEASE_TABLET | Freq: Every day | ORAL | Status: DC
  Administered 2023-08-01 – 2023-08-02 (×2): 40 mg via ORAL
  Filled 2023-07-31 (×2): qty 1

## 2023-07-31 MED ORDER — NORTRIPTYLINE HCL 25 MG PO CAPS
50.0000 mg | ORAL_CAPSULE | Freq: Every day | ORAL | Status: DC
  Administered 2023-08-01: 50 mg via ORAL
  Filled 2023-07-31: qty 2

## 2023-07-31 MED ORDER — INSULIN GLARGINE-YFGN 100 UNIT/ML ~~LOC~~ SOLN
16.0000 [IU] | Freq: Two times a day (BID) | SUBCUTANEOUS | Status: DC
  Administered 2023-07-31 – 2023-08-02 (×5): 16 [IU] via SUBCUTANEOUS
  Filled 2023-07-31 (×6): qty 0.16

## 2023-07-31 MED ORDER — INSULIN ASPART 100 UNIT/ML IJ SOLN
0.0000 [IU] | Freq: Three times a day (TID) | INTRAMUSCULAR | Status: DC
  Administered 2023-07-31 (×3): 1 [IU] via SUBCUTANEOUS
  Administered 2023-08-01: 5 [IU] via SUBCUTANEOUS
  Administered 2023-08-01: 3 [IU] via SUBCUTANEOUS
  Administered 2023-08-01: 2 [IU] via SUBCUTANEOUS
  Administered 2023-08-02: 5 [IU] via SUBCUTANEOUS
  Filled 2023-07-31 (×7): qty 1

## 2023-07-31 MED ORDER — BUTALBITAL-APAP-CAFFEINE 50-325-40 MG PO TABS
1.0000 | ORAL_TABLET | Freq: Once | ORAL | Status: AC
  Administered 2023-07-31: 1 via ORAL
  Filled 2023-07-31: qty 1

## 2023-07-31 MED ORDER — FINASTERIDE 5 MG PO TABS
5.0000 mg | ORAL_TABLET | Freq: Every day | ORAL | Status: DC
  Administered 2023-08-01 – 2023-08-02 (×2): 5 mg via ORAL
  Filled 2023-07-31 (×2): qty 1

## 2023-07-31 NOTE — Progress Notes (Signed)
 I have reviewed and concur with this student's documentation. IVAR Beal, RN  Pamula Sharps, Student-RN 07/31/2023 11:34 AM

## 2023-07-31 NOTE — Progress Notes (Signed)
 Pharmacy - Brief Note  Called [facility] to clarify HIV treatment - patient was started on Cabenuva injections in January with his last injection on 06/15/2023 with next injection scheduled for 08/21/2023 - He is no longer taking Biktarvy  - Patient confirmed above  Celestine Slovak, PharmD, BCPS, BCIDP Work Cell: [phone] 07/31/2023 3:00 PM

## 2023-07-31 NOTE — Consult Note (Signed)
 Urology Consult  I have been asked to see the patient by Dr. Hilma, for evaluation and management of sepsis 2/2 prostate abscess.  Chief Complaint: Lethargy  History of Present Illness: Cory Campbell is a 43 y.o. year old male with PMH DM2, HIV, CHF, CKD, and wheelchair-bound arthrogryposis multiplex congenita recently diagnosed with UTI at urgent care and started on Bactrim  who presented to the ED yesterday with reports of lethargy.  He has been admitted with sepsis due to prostate abscess.  CTAP with contrast on presentation notable for bilateral fluid density lesions within the prostate with enhancing margins consistent with small abscesses.  Urine culture from urgent care has finalized with pansensitive Staph aureus.  A.m. labs today with normalized lactate, now 1.2; improved white count, 6.3; and improved creatinine, 1.41.  He has been started on antibiotics as below.  Today he reports feeling somewhat better since arrival.  He admits to some low back pain.  Anti-infectives (From admission, onward)    Start     Dose/Rate Route Frequency Ordered Stop   08/01/23 1000  bictegravir-emtricitabine -tenofovir  AF (BIKTARVY ) 50-200-25 MG per tablet 1 tablet        1 tablet Oral Daily 07/31/23 0020     07/30/23 2200  meropenem (MERREM) 1 g in sodium chloride  0.9 % 100 mL IVPB        1 g 200 mL/hr over 30 Minutes Intravenous Every 12 hours 07/30/23 1936     07/30/23 1445  cefTRIAXone  (ROCEPHIN ) 2 g in sodium chloride  0.9 % 100 mL IVPB        2 g 200 mL/hr over 30 Minutes Intravenous Once 07/30/23 1437 07/30/23 1603        Past Medical History:  Diagnosis Date   (HFpEF) heart failure with preserved ejection fraction (HCC)    Acid reflux    Adopted    [facility] (arthrogryposis multiplex congenita)    wheelchair bound because of this   Anxiety    Arthritis    Arthrogryposis    Asthma    BPH (benign prostatic hyperplasia)    Bronchitis    CHF (congestive heart failure) (HCC)     Chronic pain syndrome    Depressive disorder, not elsewhere classified    Diabetes mellitus without complication (HCC)    Diverticulitis of colon (without mention of hemorrhage)(562.11)    ED (erectile dysfunction)    Herpes genitalis    HIV infection (HCC)    HTN (hypertension)    Hyperlipidemia    Hypertensive cardiomyopathy (HCC)    Hypogonadism in male    OAB (overactive bladder)    Other psoriasis    Renal disorder    Seizure disorder (HCC)    Sleep apnea    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 or other cause, without mention of thyrotoxic crisis or storm    hyperthyroidism    Past Surgical History:  Procedure Laterality Date   APPENDECTOMY     COLOSTOMY     CORNEAL TRANSPLANT     DIALYSIS/PERMA CATHETER INSERTION Right 03/10/2022   Procedure: DIALYSIS/PERMA CATHETER INSERTION;  Surgeon: Jama Cordella MATSU, MD;  Location: [facility] INVASIVE CV LAB;  Service: Cardiovascular;  Laterality: Right;   DIALYSIS/PERMA CATHETER REMOVAL N/A 04/18/2022   Procedure: DIALYSIS/PERMA CATHETER REMOVAL;  Surgeon: Jama Cordella MATSU, MD;  Location: [facility] INVASIVE CV LAB;  Service: Cardiovascular;  Laterality: N/A;   feet surgery     both   HAND SURGERY     left and right   leg surgery  left and right   ORTHOPEDIC SURGERY     hands, feet, knees, legs   tubes in ears     both    Home Medications:  Current Meds  Medication Sig   acyclovir  (ZOVIRAX ) 400 MG tablet Take 400 mg by mouth 3 (three) times daily.   albuterol  (PROVENTIL  HFA;VENTOLIN  HFA) 108 (90 BASE) MCG/ACT inhaler Inhale 2 puffs into the lungs every 4 (four) hours as needed for wheezing or shortness of breath.    albuterol  (PROVENTIL ) (2.5 MG/3ML) 0.083% nebulizer solution Take 2.5 mg by nebulization every 4 (four) hours as needed for wheezing or shortness of breath.   buprenorphine  (BUTRANS ) 7.5 MCG/HR Place 7.5 mg onto the skin once a week. Tuesday   buPROPion  (WELLBUTRIN  XL) 150 MG 24 hr tablet Take 150 mg by mouth daily.    CABENUVA 600 & 900 MG/3ML injection INJECT A TOTAL OF 6 ML IN THE MUSCLE ONCE A MONTH FOR 2 MONTHS THEN USE EVERY 2 MONTHS THEREAFTER.   cetirizine (ZYRTEC) 10 MG tablet Take 10 mg by mouth daily.   chlorthalidone  (HYGROTON ) 25 MG tablet Take 25 mg by mouth daily.   cloNIDine  (CATAPRES  - DOSED IN MG/24 HR) 0.2 mg/24hr patch Place 0.2 mg onto the skin once a week.   cyclobenzaprine  (FLEXERIL ) 5 MG tablet Take 5 mg by mouth 3 (three) times daily as needed.   diclofenac  sodium (VOLTAREN ) 1 % GEL Apply 2-4 g topically 4 (four) times daily as needed. For pain.   dicyclomine  (BENTYL ) 10 MG capsule Take 20 mg by mouth 2 (two) times daily before a meal.   famotidine  (PEPCID ) 20 MG tablet Take 20 mg by mouth daily.    ferrous sulfate 325 (65 FE) MG tablet Take 325 mg by mouth daily with breakfast.   Fluocinolone Acetonide Scalp 0.01 % OIL Apply 1 Application topically 2 (two) times daily as needed.   fluticasone  (FLONASE ) 50 MCG/ACT nasal spray Place 2 sprays into both nostrils daily.   fluticasone  (FLOVENT  HFA) 110 MCG/ACT inhaler Inhale 2 puffs into the lungs 2 (two) times daily.   gabapentin  (NEURONTIN ) 300 MG capsule Take 1 capsule (300 mg total) by mouth 2 (two) times daily. (Patient taking differently: Take 300 mg by mouth 3 (three) times daily.)   glucose 4 GM chewable tablet Chew 1 tablet by mouth as needed for low blood sugar.   hydrALAZINE  (APRESOLINE ) 25 MG tablet Take 25 mg by mouth 3 (three) times daily.   Insulin  Aspart FlexPen (NOVOLOG ) 100 UNIT/ML Inject 30 Units into the skin 3 (three) times daily before meals.   insulin  degludec (TRESIBA ) 200 UNIT/ML FlexTouch Pen Inject 30 Units into the skin daily.   isosorbide  dinitrate (ISORDIL ) 10 MG tablet Take 10 mg by mouth 3 (three) times daily.   KERENDIA 10 MG TABS Take 10 mg by mouth daily.   ketoconazole  (NIZORAL ) 2 % shampoo Apply 1 Application topically 2 (two) times a week.   lidocaine  (LIDODERM ) 5 % 2 patches daily.   lisinopril   (ZESTRIL ) 40 MG tablet Take 1 tablet by mouth daily.   Melatonin 5 MG TABS Take 10 mg by mouth at bedtime as needed (sleep).   metFORMIN (GLUCOPHAGE-XR) 500 MG 24 hr tablet Take 1,000 mg by mouth 2 (two) times daily.   metoprolol  succinate (TOPROL -XL) 100 MG 24 hr tablet TAKE 1 TABLET(100 MG) BY MOUTH TWICE DAILY WITH OR IMMEDIATELY FOLLOWING A MEAL   Multiple Vitamin (MULTIVITAMIN) tablet Take 1 tablet by mouth daily.   nitroGLYCERIN  (NITROSTAT ) 0.4  MG SL tablet PLACE 1 TABLET UNDER THE TONGUE EVERY 5 MINUTES AS NEEDED FOR CHEST PAIN. CALL 911 IF YOU HAVE TAKEN 3 TABLETS FOR CHEST PAIN   nortriptyline  (PAMELOR ) 25 MG capsule Take 50 mg by mouth at bedtime.   omega-3 acid ethyl esters (LOVAZA ) 1 g capsule Take 1 g by mouth 3 (three) times daily.   omeprazole (PRILOSEC) 20 MG capsule Take 20 mg by mouth daily.   ondansetron  (ZOFRAN -ODT) 4 MG disintegrating tablet Take 4 mg by mouth every 8 (eight) hours as needed for nausea or vomiting.   polyethylene glycol (MIRALAX  / GLYCOLAX ) 17 g packet Take 17 g by mouth 3 (three) times daily.   rosuvastatin  (CRESTOR ) 40 MG tablet TAKE 1 TABLET(40 MG) BY MOUTH DAILY   solifenacin (VESICARE) 10 MG tablet Take 10 mg by mouth daily.   sulfamethoxazole -trimethoprim  (BACTRIM  DS) 800-160 MG tablet Take 1 tablet by mouth 2 (two) times daily for 7 days.   [DISCONTINUED] acetaminophen  (TYLENOL ) 500 MG tablet Take by mouth.   [DISCONTINUED] BIKTARVY  50-200-25 MG TABS tablet Take 1 tablet by mouth daily.   [DISCONTINUED] insulin  degludec (TRESIBA ) 100 UNIT/ML FlexTouch Pen Inject 24 Units into the skin 2 (two) times daily. (Patient taking differently: Inject 16 Units into the skin 2 (two) times daily.)   [DISCONTINUED] mirtazapine  (REMERON ) 45 MG tablet Take 45 mg by mouth at bedtime.    Allergies:  Allergies  Allergen Reactions   Penicillins Hives, Itching and Rash    Has patient had a PCN reaction causing immediate rash, facial/tongue/throat swelling, SOB or  lightheadedness with hypotension: Yes  Has patient had a PCN reaction causing severe rash involving mucus membranes or skin necrosis: No  Has patient had a PCN reaction that required hospitalization No  Has patient had a PCN reaction occurring within the last 10 years: No  If all of the above answers are NO, then may proceed with Cephalosporin use.  Has tolerated amoxicillin  without problems  Has patient had a PCN reaction causing immediate rash, facial/tongue/throat swelling, SOB or lightheadedness with hypotension: Yes    Has patient had a PCN reaction causing severe rash involving mucus membranes or skin necrosis: No    Has patient had a PCN reaction that required hospitalization No    Has patient had a PCN reaction occurring within the last 10 years: No    If all of the above answers are NO, then may proceed with Cephalosporin use.    Has patient had a PCN reaction causing immediate rash, facial/tongue/throat swelling, SOB or lightheadedness with hypotension: Yes Has patient had a PCN reaction causing severe rash involving mucus membranes or skin necrosis: No Has patient had a PCN reaction that required hospitalization No Has patient had a PCN reaction occurring within the last 10 years: No If all of the above answers are NO, then may proceed with Cephalosporin use.  Has tolerated amoxicillin  without problems   Erythromycin Base Itching and Rash    Family History  Adopted: Yes  Family history unknown: Yes    Social History:  reports that he quit smoking about 5 years ago. His smoking use included pipe, cigars, and e-cigarettes. He has never used smokeless tobacco. He reports that he does not currently use alcohol after a past usage of about 3.0 standard drinks of alcohol per week. He reports that he does not use drugs.  ROS: A complete review of systems was performed.  All systems are negative except for pertinent findings as noted.  Physical Exam:  Vital signs in last  24 hours: Temp:  [98.2 F (36.8 C)-101.2 F (38.4 C)] 98.2 F (36.8 C) (06/24 0711) Pulse Rate:  [80-97] 87 (06/24 0711) Resp:  [16-24] 16 (06/24 0711) BP: (94-136)/(70-106) 123/85 (06/24 0711) SpO2:  [97 %-100 %] 100 % (06/24 0711) Weight:  [66.2 kg-66.5 kg] 66.5 kg (06/24 0412) Constitutional:  Alert and oriented, no acute distress, ill-appearing HEENT: [place] AT, moist mucus membranes Cardiovascular: No clubbing, cyanosis, or edema Respiratory: Normal respiratory effort Skin: No rashes, bruises or suspicious lesions Neurologic: Atrophic BLEs Psychiatric: Normal mood and affect  Laboratory Data:  Recent Labs    07/30/23 1437 07/31/23 0149  WBC 10.1 6.3  HGB 13.0 12.6*  HCT 41.6 38.0*   Recent Labs    07/30/23 1437 07/30/23 2137 07/31/23 0149  NA 131*  --  133*  K 6.3* 4.3 4.4  CL 102  --  99  CO2 25  --  24  GLUCOSE 190*  --  159*  BUN 19  --  14  CREATININE 1.81*  --  1.41*  CALCIUM  8.8*  --  8.8*   Recent Labs    07/30/23 1437  INR 1.1   Urinalysis    Component Value Date/Time   COLORURINE YELLOW (A) 07/30/2023 1728   APPEARANCEUR HAZY (A) 07/30/2023 1728   APPEARANCEUR Cloudy 05/16/2014 0350   LABSPEC 1.021 07/30/2023 1728   LABSPEC 1.018 05/16/2014 0350   PHURINE 7.0 07/30/2023 1728   GLUCOSEU NEGATIVE 07/30/2023 1728   GLUCOSEU Negative 05/16/2014 0350   HGBUR NEGATIVE 07/30/2023 1728   BILIRUBINUR NEGATIVE 07/30/2023 1728   BILIRUBINUR Negative 05/16/2014 0350   KETONESUR NEGATIVE 07/30/2023 1728   PROTEINUR 30 (A) 07/30/2023 1728   NITRITE NEGATIVE 07/30/2023 1728   LEUKOCYTESUR LARGE (A) 07/30/2023 1728   LEUKOCYTESUR 1+ 05/16/2014 0350   Results for orders placed or performed during the hospital encounter of 07/30/23  Blood Culture (routine x 2)     Status: None (Preliminary result)   Collection Time: 07/30/23  2:37 PM   Specimen: BLOOD  Result Value Ref Range Status   Specimen Description BLOOD BLOOD LEFT ARM  Final   Special Requests    Final    BOTTLES DRAWN AEROBIC AND ANAEROBIC Blood Culture results may not be optimal due to an inadequate volume of blood received in culture bottles   Culture   Final    NO GROWTH < 24 HOURS Performed at [facility], [address] [place], [place] [address]    Report Status PENDING  Incomplete  Blood Culture (routine x 2)     Status: None (Preliminary result)   Collection Time: 07/30/23  2:42 PM   Specimen: BLOOD  Result Value Ref Range Status   Specimen Description BLOOD LEFT ANTECUBITAL  Final   Special Requests   Final    BOTTLES DRAWN AEROBIC AND ANAEROBIC Blood Culture results may not be optimal due to an inadequate volume of blood received in culture bottles   Culture   Final    NO GROWTH < 24 HOURS Performed at [facility], [address], [place], [place] [address]    Report Status PENDING  Incomplete  Resp panel by RT-PCR (RSV, Flu A&B, Covid) Anterior Nasal Swab     Status: None   Collection Time: 07/30/23  2:51 PM   Specimen: Anterior Nasal Swab  Result Value Ref Range Status   SARS Coronavirus 2 by RT PCR NEGATIVE NEGATIVE Final    Comment: (NOTE) SARS-CoV-2 target nucleic  acids are NOT DETECTED.  The SARS-CoV-2 RNA is generally detectable in upper respiratory specimens during the acute phase of infection. The lowest concentration of SARS-CoV-2 viral copies this assay can detect is 138 copies/mL. A negative result does not preclude SARS-Cov-2 infection and should not be used as the sole basis for treatment or other patient management decisions. A negative result may occur with  improper specimen collection/handling, submission of specimen other than nasopharyngeal swab, presence of viral mutation(s) within the areas targeted by this assay, and inadequate number of viral copies(<138 copies/mL). A negative result must be combined with clinical observations, patient history, and epidemiological information. The expected result is  Negative.  Fact Sheet for Patients:  BloggerCourse.com  Fact Sheet for Healthcare Providers:  SeriousBroker.it  This test is no t yet approved or cleared by the [place]  FDA and  has been authorized for detection and/or diagnosis of SARS-CoV-2 by FDA under an Emergency Use Authorization (EUA). This EUA will remain  in effect (meaning this test can be used) for the duration of the COVID-19 declaration under Section 564(b)(1) of the Act, 21 U.S.C.section 360bbb-3(b)(1), unless the authorization is terminated  or revoked sooner.       Influenza A by PCR NEGATIVE NEGATIVE Final   Influenza B by PCR NEGATIVE NEGATIVE Final    Comment: (NOTE) The Xpert Xpress SARS-CoV-2/FLU/RSV plus assay is intended as an aid in the diagnosis of influenza from Nasopharyngeal swab specimens and should not be used as a sole basis for treatment. Nasal washings and aspirates are unacceptable for Xpert Xpress SARS-CoV-2/FLU/RSV testing.  Fact Sheet for Patients: BloggerCourse.com  Fact Sheet for Healthcare Providers: SeriousBroker.it  This test is not yet approved or cleared by the [place]  FDA and has been authorized for detection and/or diagnosis of SARS-CoV-2 by FDA under an Emergency Use Authorization (EUA). This EUA will remain in effect (meaning this test can be used) for the duration of the COVID-19 declaration under Section 564(b)(1) of the Act, 21 U.S.C. section 360bbb-3(b)(1), unless the authorization is terminated or revoked.     Resp Syncytial Virus by PCR NEGATIVE NEGATIVE Final    Comment: (NOTE) Fact Sheet for Patients: BloggerCourse.com  Fact Sheet for Healthcare Providers: SeriousBroker.it  This test is not yet approved or cleared by the [place]  FDA and has been authorized for detection and/or diagnosis of  SARS-CoV-2 by FDA under an Emergency Use Authorization (EUA). This EUA will remain in effect (meaning this test can be used) for the duration of the COVID-19 declaration under Section 564(b)(1) of the Act, 21 U.S.C. section 360bbb-3(b)(1), unless the authorization is terminated or revoked.  Performed at [facility], [address], [place], [place] [address]     Radiologic Imaging: CT ABDOMEN PELVIS W CONTRAST Result Date: 07/30/2023 CLINICAL DATA:  Lethargy. Urinary tract infection. Dysuria. The patient is wheelchair-bound. EXAM: CT ABDOMEN AND PELVIS WITH CONTRAST TECHNIQUE: Multidetector CT imaging of the abdomen and pelvis was performed using the standard protocol following bolus administration of intravenous contrast. RADIATION DOSE REDUCTION: This exam was performed according to the departmental dose-optimization program which includes automated exposure control, adjustment of the mA and/or kV according to patient size and/or use of iterative reconstruction technique. CONTRAST:  OMNIPAQUE  IOHEXOL  300 MG/ML  SOLN COMPARISON:  03/05/2022 FINDINGS: Lower chest: Dependent subsegmental atelectasis in both lower lobes. Hepatobiliary: Dependent gallstones in the gallbladder. Pancreas: Unremarkable Spleen: Unremarkable Adrenals/Urinary Tract: The adrenal glands and kidneys appear normal. Diffuse wall thickening of the urinary bladder, suspicious for  cystitis. Stomach/Bowel: Circumferential wall thickening of the rectum as shown on prior exams including 03/05/2022, presumably from chronic inflammation. Upper normal amount of stool in the colon. There is edema in the left abdominal small bowel mesentery for example image 56 series 2, nonspecific but potentially a manifestation of enteritis. No dilated small bowel is currently seen. Appendix surgically absent. Vascular/Lymphatic: Small retroperitoneal lymph nodes observed. No substantial degree of atherosclerosis. Reproductive: New fluid  density lesions with enhancing margins in the prostate gland bilaterally as on images 93 through 96 of series 2, concerning for prostatic abscesses. Other: No supplemental non-categorized findings. Musculoskeletal: Prominent muscular atrophy involving the pelvic musculature and upper thigh musculature. Severely dysplastic and chronically dislocated hips. IMPRESSION: 1. New fluid density lesions with enhancing margins in the prostate gland bilaterally, concerning for prostatic abscesses. 2. Diffuse wall thickening of the urinary bladder, suspicious for cystitis. 3. Edema in the left abdominal small bowel mesentery, nonspecific but potentially a manifestation of enteritis. 4. Circumferential wall thickening of the rectum as shown on prior exams including 03/05/2022, presumably from chronic inflammation. 5. Cholelithiasis. 6. Prominent muscular atrophy involving the pelvic musculature and upper thigh musculature. 7. Severely dysplastic and chronically dislocated hips. Electronically Signed   By: Ryan Salvage M.D.   On: 07/30/2023 17:04   DG Chest Port 1 View Result Date: 07/30/2023 CLINICAL DATA:  Concern for sepsis. EXAM: PORTABLE CHEST 1 VIEW COMPARISON:  04/07/2022. FINDINGS: Low lung volumes. Patient is rotated to the right. The heart size and mediastinal contours are otherwise within normal limits. Both lungs are clear. No pleural effusion or pneumothorax. No acute osseous abnormality. IMPRESSION: Low lung volumes. No acute cardiopulmonary findings Electronically Signed   By: Harrietta Sherry M.D.   On: 07/30/2023 15:16    Assessment & Plan:  43 y.o. comorbid male admitted with sepsis due to multiple small prostate abscesses.  Unfortunately, his abscesses are too small and position is not amenable to transurethral resection.  Agree with IV antibiotics for now.  We discussed that if he fails to clinically improve, we may consider transrectal ultrasound with aspiration for management of his  abscesses.  He expressed understanding.  He asked me to update his wife, and I updated her as above.  Recommendations: - Continue antibiotics and supportive care - May consider transrectal ultrasound with abscess aspiration if he fails to clinically improve  Thank you for involving me in this patient's care, I will continue to follow along.  Dio Giller, PA-C 07/31/2023 12:03 PM

## 2023-07-31 NOTE — Progress Notes (Signed)
 PROGRESS NOTE  Cory Campbell    DOB: 1980-10-29, 43 y.o.  FMW:[id number]    Code Status: Full Code   DOA: 07/30/2023   LOS: 1   Brief hospital course  Cory Campbell is a 43 y.o. male with medical history significant of arthrogryposis multiplex congenita, wheelchair-bound, s/p of partial colectomy, diverticulitis, s/p of corneal transplantation, hypertension, hyperlipidemia, diabetes mellitus, asthma, hypothyroidism, depression with anxiety, remote seizure not on any medication, HIV, chronic pain syndrome.  BPH, cCHF,  CKD-3a, who presents with increased urinary frequency, fever, lower back pain. ED Course: WBC 10.1, UA (hazy appearance, large amount of leukocyte, negative bacteria, WBC> 50), potassium 6.3, worsening renal function, negative PCR for COVID, flu and RSV, temperature one 1.2, heart rate 96, RR 22, oxygen saturation 98% on room air.  Chest x-ray negative.  CT scan of abdomen/pelvis: 1. New fluid density lesions with enhancing margins in the prostate gland bilaterally, concerning for prostatic abscesses. 2. Diffuse wall thickening of the urinary bladder, suspicious for cystitis. 3. Edema in the left abdominal small bowel mesentery, nonspecific but potentially a manifestation of enteritis. 4. Circumferential wall thickening of the rectum as shown on prior exams including 03/05/2022, presumably from chronic inflammation. 5. Cholelithiasis. 6. Prominent muscular atrophy involving the pelvic musculature and upper thigh musculature. 7. Severely dysplastic and chronically dislocated hips.   Patient was admitted to medicine service for further workup and management of complicated UTI as outlined in detail below.  07/31/23 -clinically stable. Urology consulted.   Assessment & Plan  Principal Problem:   Prostatic abscess Active Problems:   Severe sepsis (HCC)   Hypotension   Hyperkalemia   Acute renal failure superimposed on stage 3a chronic kidney disease (HCC)    Hypertension associated with diabetes (HCC)   HLD (hyperlipidemia)   Type II diabetes mellitus with renal manifestations (HCC)   Chronic pain syndrome   Asthma   HIV (human immunodeficiency virus infection) (HCC)   BPH (benign prostatic hyperplasia)   Chronic diastolic CHF (congestive heart failure) (HCC)   Depression with anxiety  Severe sepsis due prostatic abscess: fever resolved.  Urology following. Continue IV Abx. Abscess not amenable to drainage at this time.  -Started IV  meropenem (Patient received 1 dose of Rocephin  in ED). - Follow-up blood culture and urine culture - IV fluid: Total 2.75 L of LR, then 75 cc/h  Hypotension: due to severe sepsis - Hold all blood pressure medications   Hyperkalemia: Potassium 6.3> 4.4. resolved s/p 1 g calcium  gluconate, sodium bicarbonate , NovoLog  10 unit, D50, 10 g Lokelma  -  BMP am   AKI superimposed on stage 3a chronic kidney disease (HCC): Recent creatinine 1.28 on 10/22/2022.  His creatinine is 1.81, BUN 19, GFR 47.  Likely due to ongoing infection, ATN is also possible due to hypotension - improving -Avoid using all renal toxic medications.-   Hypertension associated with diabetes (HCC) -Hold all blood pressure medications: Amlodipine , hydralazine , Imdur , lisinopril , hygroton , metoprolol    HLD (hyperlipidemia) -Crestor    Type II diabetes mellitus with renal manifestations [facility]): Recent A1c 8.1, poorly controlled.  Patient taking Trulicity, Tresiba  24 units twice daily -SSI - Tresiba  16 units twice daily   Chronic pain syndrome -Continue home buprenorphine  patch, gabapentin    Asthma -Bronchodilators and as needed Mucinex    HIV (human immunodeficiency virus infection) (HCC): CD4 was 232 on 05/16/17 -Continue clear Biktarvy  - Patient is also on Cbenuva injection   BPH (benign prostatic hyperplasia) -Proscar   - monitor UOP   Chronic diastolic CHF (congestive  heart failure) (HCC): 2D echo 10/08/2016 showed EF 50-55%.  Patient  is clinically dry.  BNP 16.6. - Watch volume status closely   Depression with anxiety -Continue home medications  Body mass index is 23.66 kg/m.  VTE ppx: heparin  injection 5,000 Units Start: 07/31/23 0600  Diet:     Diet   Diet NPO time specified Except for: Sips with Meds, Ice Chips   Consultants: Urology   Subjective 07/31/23    Pt reports improved pain at rest. Do urinary retention.    Objective  Blood pressure 123/85, pulse 87, temperature 98.2 F (36.8 C), resp. rate 16, height 5' 6 (1.676 m), weight 66.5 kg, SpO2 100%.  Intake/Output Summary (Last 24 hours) at 07/31/2023 0801 Last data filed at 07/31/2023 0700 Gross per 24 hour  Intake 173.44 ml  Output 1200 ml  Net -1026.56 ml   Filed Weights   07/30/23 1427 07/31/23 0412  Weight: 66.2 kg 66.5 kg    Physical Exam:  General: awake, alert, NAD HEENT: atraumatic, clear conjunctiva, anicteric sclera, MMM, hearing grossly normal Respiratory: normal respiratory effort. Cardiovascular: quick capillary refill Gastrointestinal: soft, NT, ND Nervous: A&O x3. no gross focal neurologic deficits, normal speech Extremities: lower extremity chronic deformity with muscle wasting  Skin: dry, intact, normal temperature, normal color. No rashes, lesions or ulcers on exposed skin Psychiatry: normal mood, congruent affect  Labs   I have personally reviewed the following labs and imaging studies CBC    Component Value Date/Time   WBC 6.3 07/31/2023 0149   RBC 4.66 07/31/2023 0149   HGB 12.6 (L) 07/31/2023 0149   HGB 9.2 (L) 05/16/2017 1521   HCT 38.0 (L) 07/31/2023 0149   HCT 28.3 (L) 05/16/2017 1521   PLT 223 07/31/2023 0149   PLT 798 (H) 05/16/2017 1521   MCV 81.5 07/31/2023 0149   MCV 85 05/16/2017 1521   MCV 85 08/28/2013 0024   MCH 27.0 07/31/2023 0149   MCHC 33.2 07/31/2023 0149   RDW 14.6 07/31/2023 0149   RDW 13.8 05/16/2017 1521   RDW 13.1 08/28/2013 0024   LYMPHSABS 0.7 07/30/2023 1437   LYMPHSABS 0.7  05/16/2017 1521   MONOABS 0.6 07/30/2023 1437   EOSABS 0.1 07/30/2023 1437   EOSABS 0.0 05/16/2017 1521   BASOSABS 0.0 07/30/2023 1437   BASOSABS 0.0 05/16/2017 1521      Latest Ref Rng & Units 07/31/2023    1:49 AM 07/30/2023    9:37 PM 07/30/2023    2:37 PM  BMP  Glucose 70 - 99 mg/dL 840   809   BUN 6 - 20 mg/dL 14   19   Creatinine 9.38 - 1.24 mg/dL 8.58   8.18   Sodium 864 - 145 mmol/L 133   131   Potassium 3.5 - 5.1 mmol/L 4.4  4.3  6.3   Chloride 98 - 111 mmol/L 99   102   CO2 22 - 32 mmol/L 24   25   Calcium  8.9 - 10.3 mg/dL 8.8   8.8     CT ABDOMEN PELVIS W CONTRAST Result Date: 07/30/2023 CLINICAL DATA:  Lethargy. Urinary tract infection. Dysuria. The patient is wheelchair-bound. EXAM: CT ABDOMEN AND PELVIS WITH CONTRAST TECHNIQUE: Multidetector CT imaging of the abdomen and pelvis was performed using the standard protocol following bolus administration of intravenous contrast. RADIATION DOSE REDUCTION: This exam was performed according to the departmental dose-optimization program which includes automated exposure control, adjustment of the mA and/or kV according to patient size and/or use of  iterative reconstruction technique. CONTRAST:  OMNIPAQUE  IOHEXOL  300 MG/ML  SOLN COMPARISON:  03/05/2022 FINDINGS: Lower chest: Dependent subsegmental atelectasis in both lower lobes. Hepatobiliary: Dependent gallstones in the gallbladder. Pancreas: Unremarkable Spleen: Unremarkable Adrenals/Urinary Tract: The adrenal glands and kidneys appear normal. Diffuse wall thickening of the urinary bladder, suspicious for cystitis. Stomach/Bowel: Circumferential wall thickening of the rectum as shown on prior exams including 03/05/2022, presumably from chronic inflammation. Upper normal amount of stool in the colon. There is edema in the left abdominal small bowel mesentery for example image 56 series 2, nonspecific but potentially a manifestation of enteritis. No dilated small bowel is currently  seen. Appendix surgically absent. Vascular/Lymphatic: Small retroperitoneal lymph nodes observed. No substantial degree of atherosclerosis. Reproductive: New fluid density lesions with enhancing margins in the prostate gland bilaterally as on images 93 through 96 of series 2, concerning for prostatic abscesses. Other: No supplemental non-categorized findings. Musculoskeletal: Prominent muscular atrophy involving the pelvic musculature and upper thigh musculature. Severely dysplastic and chronically dislocated hips. IMPRESSION: 1. New fluid density lesions with enhancing margins in the prostate gland bilaterally, concerning for prostatic abscesses. 2. Diffuse wall thickening of the urinary bladder, suspicious for cystitis. 3. Edema in the left abdominal small bowel mesentery, nonspecific but potentially a manifestation of enteritis. 4. Circumferential wall thickening of the rectum as shown on prior exams including 03/05/2022, presumably from chronic inflammation. 5. Cholelithiasis. 6. Prominent muscular atrophy involving the pelvic musculature and upper thigh musculature. 7. Severely dysplastic and chronically dislocated hips. Electronically Signed   By: Ryan Salvage M.D.   On: 07/30/2023 17:04   DG Chest Port 1 View Result Date: 07/30/2023 CLINICAL DATA:  Concern for sepsis. EXAM: PORTABLE CHEST 1 VIEW COMPARISON:  04/07/2022. FINDINGS: Low lung volumes. Patient is rotated to the right. The heart size and mediastinal contours are otherwise within normal limits. Both lungs are clear. No pleural effusion or pneumothorax. No acute osseous abnormality. IMPRESSION: Low lung volumes. No acute cardiopulmonary findings Electronically Signed   By: Harrietta Sherry M.D.   On: 07/30/2023 15:16    Disposition Plan & Communication  Patient status: Inpatient  Admitted From: Home Planned disposition location: Home Anticipated discharge date: TBD pending IV Abx completed   Family Communication: none at bedside     Author: Marien LITTIE Piety, DO [facility] 07/31/2023, 8:01 AM   Available by Epic secure chat 7AM-7PM. If 7PM-7AM, please contact night-coverage.  [facility] contact information found on [email or web address].

## 2023-07-31 NOTE — Plan of Care (Signed)

## 2023-08-01 DIAGNOSIS — N412 Abscess of prostate: Secondary | ICD-10-CM | POA: Diagnosis not present

## 2023-08-01 LAB — BASIC METABOLIC PANEL WITH GFR
Anion gap: 11 (ref 5–15)
BUN: 16 mg/dL (ref 6–20)
CO2: 25 mmol/L (ref 22–32)
Calcium: 9.8 mg/dL (ref 8.9–10.3)
Chloride: 99 mmol/L (ref 98–111)
Creatinine, Ser: 1.3 mg/dL — ABNORMAL HIGH (ref 0.61–1.24)
GFR, Estimated: >60 mL/min (ref >60)
Glucose, Bld: 206 mg/dL — ABNORMAL HIGH (ref 70–99)
Potassium: 4.4 mmol/L (ref 3.5–5.1)
Sodium: 135 mmol/L (ref 135–145)

## 2023-08-01 LAB — CBC
HCT: 39.3 % (ref 39.0–52.0)
Hemoglobin: 12.8 g/dL — ABNORMAL LOW (ref 13.0–17.0)
MCH: 27 pg (ref 26.0–34.0)
MCHC: 32.6 g/dL (ref 30.0–36.0)
MCV: 82.9 fL (ref 80.0–100.0)
Platelets: 228 10*3/uL (ref 150–400)
RBC: 4.74 MIL/uL (ref 4.22–5.81)
RDW: 14.5 % (ref 11.5–15.5)
WBC: 4.9 10*3/uL (ref 4.0–10.5)
nRBC: 0 % (ref 0.0–0.2)

## 2023-08-01 LAB — GLUCOSE, CAPILLARY
Glucose-Capillary: 179 mg/dL — ABNORMAL HIGH (ref 70–99)
Glucose-Capillary: 169 mg/dL — ABNORMAL HIGH (ref 70–99)
Glucose-Capillary: 289 mg/dL — ABNORMAL HIGH (ref 70–99)
Glucose-Capillary: 217 mg/dL — ABNORMAL HIGH (ref 70–99)

## 2023-08-01 MED ORDER — POLYETHYLENE GLYCOL 3350 17 G PO PACK
17.0000 g | PACK | Freq: Three times a day (TID) | ORAL | Status: DC
  Administered 2023-08-01 – 2023-08-02 (×3): 17 g via ORAL
  Filled 2023-08-01 (×3): qty 1

## 2023-08-01 MED ORDER — DICYCLOMINE HCL 10 MG PO CAPS
20.0000 mg | ORAL_CAPSULE | Freq: Two times a day (BID) | ORAL | Status: DC
  Administered 2023-08-01 – 2023-08-02 (×2): 20 mg via ORAL
  Filled 2023-08-01 (×2): qty 2

## 2023-08-01 MED ORDER — NYSTATIN 100000 UNIT/GM EX CREA
TOPICAL_CREAM | Freq: Two times a day (BID) | CUTANEOUS | Status: DC
  Administered 2023-08-01: 1 via TOPICAL
  Filled 2023-08-01 (×2): qty 30

## 2023-08-01 MED ORDER — BUDESONIDE 0.25 MG/2ML IN SUSP
0.2500 mg | Freq: Two times a day (BID) | RESPIRATORY_TRACT | Status: DC
  Administered 2023-08-01 – 2023-08-02 (×3): 0.25 mg via RESPIRATORY_TRACT
  Filled 2023-08-01 (×3): qty 2

## 2023-08-01 MED ORDER — MELATONIN 5 MG PO TABS: 10.0000 mg | ORAL_TABLET | Freq: Every evening | ORAL | Status: DC | PRN

## 2023-08-01 MED ORDER — LISINOPRIL 20 MG PO TABS
40.0000 mg | ORAL_TABLET | Freq: Every day | ORAL | Status: DC
  Administered 2023-08-01 – 2023-08-02 (×2): 40 mg via ORAL
  Filled 2023-08-01 (×2): qty 2

## 2023-08-01 MED ORDER — METFORMIN HCL ER 500 MG PO TB24
1000.0000 mg | ORAL_TABLET | Freq: Two times a day (BID) | ORAL | Status: DC
  Administered 2023-08-01 – 2023-08-02 (×2): 1000 mg via ORAL
  Filled 2023-08-01 (×3): qty 2

## 2023-08-01 MED ORDER — BUPRENORPHINE 7.5 MCG/HR TD PTWK
1.0000 | MEDICATED_PATCH | TRANSDERMAL | Status: DC
  Administered 2023-08-01: 1 via TRANSDERMAL
  Filled 2023-08-01: qty 1

## 2023-08-01 MED ORDER — FERROUS SULFATE 325 (65 FE) MG PO TABS
325.0000 mg | ORAL_TABLET | Freq: Every day | ORAL | Status: DC
  Administered 2023-08-02: 325 mg via ORAL
  Filled 2023-08-01: qty 1

## 2023-08-01 MED ORDER — ONDANSETRON 4 MG PO TBDP: 4.0000 mg | ORAL_TABLET | Freq: Three times a day (TID) | ORAL | Status: DC | PRN

## 2023-08-01 MED ORDER — CYCLOBENZAPRINE HCL 10 MG PO TABS: 5.0000 mg | ORAL_TABLET | Freq: Three times a day (TID) | ORAL | Status: DC | PRN

## 2023-08-01 MED ORDER — LORATADINE 10 MG PO TABS
10.0000 mg | ORAL_TABLET | Freq: Every day | ORAL | Status: DC
  Administered 2023-08-01 – 2023-08-02 (×2): 10 mg via ORAL
  Filled 2023-08-01 (×2): qty 1

## 2023-08-01 MED ORDER — CHLORTHALIDONE 25 MG PO TABS
25.0000 mg | ORAL_TABLET | Freq: Every day | ORAL | Status: DC
  Administered 2023-08-01 – 2023-08-02 (×2): 25 mg via ORAL
  Filled 2023-08-01 (×2): qty 1

## 2023-08-01 MED ORDER — ACYCLOVIR 200 MG PO CAPS
400.0000 mg | ORAL_CAPSULE | Freq: Three times a day (TID) | ORAL | Status: DC
  Administered 2023-08-01 – 2023-08-02 (×3): 400 mg via ORAL
  Filled 2023-08-01 (×4): qty 2

## 2023-08-01 MED ORDER — FAMOTIDINE 20 MG PO TABS
20.0000 mg | ORAL_TABLET | Freq: Every day | ORAL | Status: DC
  Administered 2023-08-01 – 2023-08-02 (×2): 20 mg via ORAL
  Filled 2023-08-01 (×2): qty 1

## 2023-08-01 MED ORDER — BUPROPION HCL ER (XL) 150 MG PO TB24
150.0000 mg | ORAL_TABLET | Freq: Every day | ORAL | Status: DC
  Administered 2023-08-01 – 2023-08-02 (×2): 150 mg via ORAL
  Filled 2023-08-01 (×2): qty 1

## 2023-08-01 NOTE — Progress Notes (Addendum)
 PROGRESS NOTE  Cory Campbell    DOB: 1980/06/02, 43 y.o.  FMW:[id number]    Code Status: Full Code   DOA: 07/30/2023   LOS: 2   Brief hospital course  Cory Campbell is a 43 y.o. male with medical history significant of arthrogryposis multiplex congenita, wheelchair-bound, s/p of partial colectomy, diverticulitis, s/p of corneal transplantation, hypertension, hyperlipidemia, diabetes mellitus, asthma, hypothyroidism, depression with anxiety, remote seizure not on any medication, HIV, chronic pain syndrome.  BPH, cCHF,  CKD-3a, who presents with increased urinary frequency, fever, lower back pain. ED Course: WBC 10.1, UA (hazy appearance, large amount of leukocyte, negative bacteria, WBC> 50), potassium 6.3, worsening renal function, negative PCR for COVID, flu and RSV, temperature one 1.2, heart rate 96, RR 22, oxygen saturation 98% on room air.  Chest x-ray negative.  CT scan of abdomen/pelvis: 1. New fluid density lesions with enhancing margins in the prostate gland bilaterally, concerning for prostatic abscesses. 2. Diffuse wall thickening of the urinary bladder, suspicious for cystitis. 3. Edema in the left abdominal small bowel mesentery, nonspecific but potentially a manifestation of enteritis. 4. Circumferential wall thickening of the rectum as shown on prior exams including 03/05/2022, presumably from chronic inflammation. 5. Cholelithiasis. 6. Prominent muscular atrophy involving the pelvic musculature and upper thigh musculature. 7. Severely dysplastic and chronically dislocated hips.   Patient was admitted to medicine service for further workup and management of complicated UTI as outlined in detail below.  08/01/23 -clinically stable. Urology consulted. Expect dc tomorrow if continues to do well. At least 48hr IV Abx  Assessment & Plan  Principal Problem:   Prostatic abscess Active Problems:   Severe sepsis (HCC)   Hypotension   Hyperkalemia   Acute renal failure  superimposed on stage 3a chronic kidney disease (HCC)   Hypertension associated with diabetes (HCC)   HLD (hyperlipidemia)   Type II diabetes mellitus with renal manifestations (HCC)   Chronic pain syndrome   Asthma   HIV (human immunodeficiency virus infection) (HCC)   BPH (benign prostatic hyperplasia)   Chronic diastolic CHF (congestive heart failure) (HCC)   Depression with anxiety  Severe sepsis due prostatic abscess  UTI: sepsis criteria resolved.  Urology following. Continue IV Abx. Abscess not amenable to drainage at this time.  -Started IV  meropenem (Patient received 1 dose of Rocephin  in ED). - Follow-up blood culture and urine culture. No significant growth at this time.   Hypotension in chronic hypertension: due to severe sepsis - Held all blood pressure medications on admission and titrating back on as tolerated   Hyperkalemia: Potassium 6.3> 4.4. resolved s/p 1 g calcium  gluconate, sodium bicarbonate , NovoLog  10 unit, D50, 10 g Lokelma  -  BMP am  Possible yeast infection on genitals related to Abx use.  - antifungal cream    AKI superimposed on stage 3a chronic kidney disease (HCC): now back to baseline -Avoid using all renal toxic medications.-    HLD  Type II diabetes mellitus with renal manifestations [facility]): Recent A1c 8.1, poorly controlled.  Patient taking Trulicity, Tresiba  24 units twice daily -SSI - Tresiba  16 units twice daily and sliding scale   Chronic pain syndrome -Continue home buprenorphine  patch, gabapentin    Asthma -Bronchodilators and as needed Mucinex    HIV (human immunodeficiency virus infection) (HCC): CD4 was 232 on 05/16/17 - Patient on Cbenuva injection   BPH (benign prostatic hyperplasia) -Proscar   - monitor UOP   Chronic diastolic CHF (congestive heart failure) (HCC): 2D echo 10/08/2016 showed EF 50-55%.  Patient is clinically dry.  BNP 16.6. - Watch volume status closely   Depression with anxiety -Continue home  medications  Body mass index is 23.02 kg/m.  VTE ppx: heparin  injection 5,000 Units Start: 07/31/23 0600  Diet:     Diet   Diet Carb Modified Fluid consistency: Thin; Room service appropriate? Yes   Consultants: Urology   Subjective 08/01/23    Pt reports no urinary symptoms today. Tolerating diet well. Asks for his pain medication patch to be replaced today   Objective  Blood pressure 123/85, pulse 87, temperature 98.2 F (36.8 C), resp. rate 16, height 5' 6 (1.676 m), weight 66.5 kg, SpO2 100%.  Intake/Output Summary (Last 24 hours) at 08/01/2023 0735 Last data filed at 07/31/2023 1930 Gross per 24 hour  Intake 240 ml  Output 1000 ml  Net -760 ml   Filed Weights   07/30/23 1427 07/31/23 0412 08/01/23 0459  Weight: 66.2 kg 66.5 kg 64.7 kg    Physical Exam:  General: awake, alert, NAD HEENT: atraumatic, clear conjunctiva, anicteric sclera, MMM, hearing grossly normal Respiratory: normal respiratory effort. Cardiovascular: quick capillary refill Gastrointestinal: soft, NT, ND Nervous: A&O x3. no gross focal neurologic deficits, normal speech Extremities: lower extremity chronic deformity with muscle wasting  Skin: dry, intact, normal temperature, normal color. No rashes, lesions or ulcers on exposed skin Psychiatry: normal mood, congruent affect  Labs   I have personally reviewed the following labs and imaging studies CBC    Component Value Date/Time   WBC 4.9 08/01/2023 0512   RBC 4.74 08/01/2023 0512   HGB 12.8 (L) 08/01/2023 0512   HGB 9.2 (L) 05/16/2017 1521   HCT 39.3 08/01/2023 0512   HCT 28.3 (L) 05/16/2017 1521   PLT 228 08/01/2023 0512   PLT 798 (H) 05/16/2017 1521   MCV 82.9 08/01/2023 0512   MCV 85 05/16/2017 1521   MCV 85 08/28/2013 0024   MCH 27.0 08/01/2023 0512   MCHC 32.6 08/01/2023 0512   RDW 14.5 08/01/2023 0512   RDW 13.8 05/16/2017 1521   RDW 13.1 08/28/2013 0024   LYMPHSABS 0.7 07/30/2023 1437   LYMPHSABS 0.7 05/16/2017 1521    MONOABS 0.6 07/30/2023 1437   EOSABS 0.1 07/30/2023 1437   EOSABS 0.0 05/16/2017 1521   BASOSABS 0.0 07/30/2023 1437   BASOSABS 0.0 05/16/2017 1521      Latest Ref Rng & Units 08/01/2023    5:12 AM 07/31/2023    1:49 AM 07/30/2023    9:37 PM  BMP  Glucose 70 - 99 mg/dL 793  840    BUN 6 - 20 mg/dL 16  14    Creatinine 9.38 - 1.24 mg/dL 8.69  8.58    Sodium 864 - 145 mmol/L 135  133    Potassium 3.5 - 5.1 mmol/L 4.4  4.4  4.3   Chloride 98 - 111 mmol/L 99  99    CO2 22 - 32 mmol/L 25  24    Calcium  8.9 - 10.3 mg/dL 9.8  8.8      CT ABDOMEN PELVIS W CONTRAST Result Date: 07/30/2023 CLINICAL DATA:  Lethargy. Urinary tract infection. Dysuria. The patient is wheelchair-bound. EXAM: CT ABDOMEN AND PELVIS WITH CONTRAST TECHNIQUE: Multidetector CT imaging of the abdomen and pelvis was performed using the standard protocol following bolus administration of intravenous contrast. RADIATION DOSE REDUCTION: This exam was performed according to the departmental dose-optimization program which includes automated exposure control, adjustment of the mA and/or kV according to patient size and/or use  of iterative reconstruction technique. CONTRAST:  OMNIPAQUE  IOHEXOL  300 MG/ML  SOLN COMPARISON:  03/05/2022 FINDINGS: Lower chest: Dependent subsegmental atelectasis in both lower lobes. Hepatobiliary: Dependent gallstones in the gallbladder. Pancreas: Unremarkable Spleen: Unremarkable Adrenals/Urinary Tract: The adrenal glands and kidneys appear normal. Diffuse wall thickening of the urinary bladder, suspicious for cystitis. Stomach/Bowel: Circumferential wall thickening of the rectum as shown on prior exams including 03/05/2022, presumably from chronic inflammation. Upper normal amount of stool in the colon. There is edema in the left abdominal small bowel mesentery for example image 56 series 2, nonspecific but potentially a manifestation of enteritis. No dilated small bowel is currently seen. Appendix  surgically absent. Vascular/Lymphatic: Small retroperitoneal lymph nodes observed. No substantial degree of atherosclerosis. Reproductive: New fluid density lesions with enhancing margins in the prostate gland bilaterally as on images 93 through 96 of series 2, concerning for prostatic abscesses. Other: No supplemental non-categorized findings. Musculoskeletal: Prominent muscular atrophy involving the pelvic musculature and upper thigh musculature. Severely dysplastic and chronically dislocated hips. IMPRESSION: 1. New fluid density lesions with enhancing margins in the prostate gland bilaterally, concerning for prostatic abscesses. 2. Diffuse wall thickening of the urinary bladder, suspicious for cystitis. 3. Edema in the left abdominal small bowel mesentery, nonspecific but potentially a manifestation of enteritis. 4. Circumferential wall thickening of the rectum as shown on prior exams including 03/05/2022, presumably from chronic inflammation. 5. Cholelithiasis. 6. Prominent muscular atrophy involving the pelvic musculature and upper thigh musculature. 7. Severely dysplastic and chronically dislocated hips. Electronically Signed   By: Ryan Salvage M.D.   On: 07/30/2023 17:04   DG Chest Port 1 View Result Date: 07/30/2023 CLINICAL DATA:  Concern for sepsis. EXAM: PORTABLE CHEST 1 VIEW COMPARISON:  04/07/2022. FINDINGS: Low lung volumes. Patient is rotated to the right. The heart size and mediastinal contours are otherwise within normal limits. Both lungs are clear. No pleural effusion or pneumothorax. No acute osseous abnormality. IMPRESSION: Low lung volumes. No acute cardiopulmonary findings Electronically Signed   By: Harrietta Sherry M.D.   On: 07/30/2023 15:16    Disposition Plan & Communication  Patient status: Inpatient  Admitted From: Home Planned disposition location: Home Anticipated discharge date: TBD pending IV Abx completed   Family Communication: none at bedside     Author: Marien LITTIE Piety, DO [facility] 08/01/2023, 7:35 AM   Available by Epic secure chat 7AM-7PM. If 7PM-7AM, please contact night-coverage.  [facility] contact information found on [email or web address].

## 2023-08-01 NOTE — Plan of Care (Signed)

## 2023-08-02 ENCOUNTER — Other Ambulatory Visit: Payer: Self-pay

## 2023-08-02 DIAGNOSIS — N412 Abscess of prostate: Secondary | ICD-10-CM | POA: Diagnosis not present

## 2023-08-02 DIAGNOSIS — N3090 Cystitis, unspecified without hematuria: Secondary | ICD-10-CM

## 2023-08-02 LAB — GLUCOSE, CAPILLARY: Glucose-Capillary: 277 mg/dL — ABNORMAL HIGH (ref 70–99)

## 2023-08-02 MED ORDER — SULFAMETHOXAZOLE-TRIMETHOPRIM 800-160 MG PO TABS
1.0000 | ORAL_TABLET | Freq: Two times a day (BID) | ORAL | 0 refills | Status: DC
Start: 2023-08-02 — End: 2023-08-07
  Filled 2023-08-02: qty 24, 12d supply, fill #0

## 2023-08-02 MED ORDER — ISOSORBIDE DINITRATE 10 MG PO TABS
10.0000 mg | ORAL_TABLET | Freq: Three times a day (TID) | ORAL | Status: DC
  Administered 2023-08-02: 10 mg via ORAL
  Filled 2023-08-02 (×2): qty 1

## 2023-08-02 MED ORDER — METOPROLOL SUCCINATE ER 100 MG PO TB24
100.0000 mg | ORAL_TABLET | Freq: Two times a day (BID) | ORAL | Status: DC
  Administered 2023-08-02: 100 mg via ORAL
  Filled 2023-08-02: qty 1

## 2023-08-02 MED ORDER — NYSTATIN 100000 UNIT/GM EX CREA: TOPICAL_CREAM | Freq: Two times a day (BID) | CUTANEOUS | 0 refills | Status: AC

## 2023-08-02 MED ORDER — CEPHALEXIN 500 MG PO CAPS
500.0000 mg | ORAL_CAPSULE | Freq: Two times a day (BID) | ORAL | 0 refills | Status: DC
  Filled 2023-08-02: qty 10, 5d supply, fill #0

## 2023-08-02 NOTE — Discharge Instructions (Signed)
 Please continue to take your oral antibiotics until the course is completed.  I have held one of your blood pressure medications to avoid having too low of blood pressures while you're being treated for infection. Please follow up with your primary doctor in 1-2 weeks to discuss if this needs to be restarted again when you have recovered.

## 2023-08-02 NOTE — Discharge Summary (Signed)
 Physician Discharge Summary  Patient: Cory Campbell FMW:[id number] DOB: 28-Oct-1980   Code Status: Full Code Admit date: 07/30/2023 Discharge date: 08/02/2023 Disposition: Home, No home health services recommended PCP: Lorel Maxie LABOR, MD  Recommendations for Outpatient Follow-up:  Follow up with PCP within 1-2 weeks Regarding general hospital follow up and preventative care Recommend BMP, consider repeat urine culture is symptomatic  Follow up with urology   Discharge Diagnoses:  Principal Problem:   Prostatic abscess Active Problems:   Severe sepsis (HCC)   Hypotension   Hyperkalemia   Acute renal failure superimposed on stage 3a chronic kidney disease (HCC)   Hypertension associated with diabetes (HCC)   HLD (hyperlipidemia)   Type II diabetes mellitus with renal manifestations (HCC)   Chronic pain syndrome   Asthma   HIV (human immunodeficiency virus infection) (HCC)   BPH (benign prostatic hyperplasia)   Chronic diastolic CHF (congestive heart failure) (HCC)   Depression with anxiety   Prostate abscess  Brief Hospital Course Summary: Cory Campbell is a 43 y.o. male with medical history significant of arthrogryposis multiplex congenita, wheelchair-bound, s/p of partial colectomy, diverticulitis, s/p of corneal transplantation, hypertension, hyperlipidemia, diabetes mellitus, asthma, hypothyroidism, depression with anxiety, remote seizure not on any medication, HIV, chronic pain syndrome.  BPH, cCHF,  CKD-3a, who presents with increased urinary frequency, fever, lower back pain. ED Course: WBC 10.1, UA (hazy appearance, large amount of leukocyte, negative bacteria, WBC> 50), potassium 6.3, worsening renal function, negative PCR for COVID, flu and RSV, temperature one 1.2, heart rate 96, RR 22, oxygen saturation 98% on room air.  Chest x-ray negative.  CT scan of abdomen/pelvis: 1. New fluid density lesions with enhancing margins in the prostate gland bilaterally,  concerning for prostatic abscesses. 2. Diffuse wall thickening of the urinary bladder, suspicious for cystitis. 3. Edema in the left abdominal small bowel mesentery, nonspecific but potentially a manifestation of enteritis. 4. Circumferential wall thickening of the rectum as shown on prior exams including 03/05/2022, presumably from chronic inflammation. 5. Cholelithiasis. 6. Prominent muscular atrophy involving the pelvic musculature and upper thigh musculature. 7. Severely dysplastic and chronically dislocated hips.   Patient was admitted to medicine service for further workup and management of complicated UTI as outlined in detail below.   08/02/23 -clinically stable. Urology consulted. Expect dc tomorrow if continues to do well. At least 48hr IV Abx  All other chronic conditions were treated with home medications.    Discharge Condition: {DISCHARGE CONDITION:19696}, improved Recommended discharge diet: {Discharge Ipzu:[id number]}  Consultations: ***  Procedures/Studies: ***   Allergies as of 08/02/2023       Reactions   Penicillins Hives, Itching, Rash   Has tolerated amoxicillin  and ampicillin  (as part of unasyn )   Erythromycin Base Itching, Rash        Medication List     PAUSE taking these medications    hydrALAZINE  25 MG tablet Wait to take this until your doctor or other care provider tells you to start again. Commonly known as: APRESOLINE  Take 25 mg by mouth 3 (three) times daily.       STOP taking these medications    cloNIDine  0.2 mg/24hr patch Commonly known as: CATAPRES  - Dosed in mg/24 hr   nystatin  100000 UNIT/ML suspension Commonly known as: MYCOSTATIN    Potassium Chloride  ER 20 MEQ Tbcr   promethazine -dextromethorphan  6.25-15 MG/5ML syrup Commonly known as: PROMETHAZINE -DM       TAKE these medications    acyclovir  400 MG tablet Commonly known as:  ZOVIRAX  Take 400 mg by mouth 3 (three) times daily.   AeroChamber Plus inhaler Use  as instructed   albuterol  (2.5 MG/3ML) 0.083% nebulizer solution Commonly known as: PROVENTIL  Take 2.5 mg by nebulization every 4 (four) hours as needed for wheezing or shortness of breath.   albuterol  108 (90 Base) MCG/ACT inhaler Commonly known as: VENTOLIN  HFA Inhale 2 puffs into the lungs every 4 (four) hours as needed for wheezing or shortness of breath.   buprenorphine  7.5 MCG/HR Commonly known as: BUTRANS  Place 7.5 mg onto the skin once a week. Tuesday   buPROPion  150 MG 24 hr tablet Commonly known as: WELLBUTRIN  XL Take 150 mg by mouth daily.   Cabenuva 600 & 900 MG/3ML injection Generic drug: cabotegravir & rilpivirine ER INJECT A TOTAL OF 6 ML IN THE MUSCLE ONCE A MONTH FOR 2 MONTHS THEN USE EVERY 2 MONTHS THEREAFTER.   cetirizine 10 MG tablet Commonly known as: ZYRTEC Take 10 mg by mouth daily.   chlorthalidone  25 MG tablet Commonly known as: HYGROTON  Take 25 mg by mouth daily.   cyclobenzaprine  5 MG tablet Commonly known as: FLEXERIL  Take 5 mg by mouth 3 (three) times daily as needed.   Dexcom G7 Receiver Devi Dispense one   Dexcom G7 Sensor Misc Use every 10 days.   diclofenac  sodium 1 % Gel Commonly known as: VOLTAREN  Apply 2-4 g topically 4 (four) times daily as needed. For pain.   dicyclomine  10 MG capsule Commonly known as: BENTYL  Take 20 mg by mouth 2 (two) times daily before a meal.   famotidine  20 MG tablet Commonly known as: PEPCID  Take 20 mg by mouth daily.   feeding supplement (GLUCERNA SHAKE) Liqd Take 237 mLs by mouth 4 (four) times daily.   ferrous sulfate  325 (65 FE) MG tablet Take 325 mg by mouth daily with breakfast.   Fluocinolone Acetonide Scalp 0.01 % Oil Apply 1 Application topically 2 (two) times daily as needed.   fluticasone  110 MCG/ACT inhaler Commonly known as: FLOVENT  HFA Inhale 2 puffs into the lungs 2 (two) times daily.   fluticasone  50 MCG/ACT nasal spray Commonly known as: FLONASE  Place 2 sprays into both  nostrils daily.   gabapentin  300 MG capsule Commonly known as: NEURONTIN  Take 1 capsule (300 mg total) by mouth 2 (two) times daily. What changed: when to take this   glucose 4 GM chewable tablet Chew 1 tablet by mouth as needed for low blood sugar.   Insulin  Aspart FlexPen 100 UNIT/ML Commonly known as: NOVOLOG  Inject 30 Units into the skin 3 (three) times daily before meals.   insulin  degludec 200 UNIT/ML FlexTouch Pen Commonly known as: TRESIBA  Inject 30 Units into the skin daily.   isosorbide  dinitrate 10 MG tablet Commonly known as: ISORDIL  Take 10 mg by mouth 3 (three) times daily.   Kerendia 10 MG Tabs Generic drug: Finerenone Take 10 mg by mouth daily.   ketoconazole  2 % shampoo Commonly known as: NIZORAL  Apply 1 Application topically 2 (two) times a week.   lidocaine  5 % Commonly known as: LIDODERM  2 patches daily.   lisinopril  40 MG tablet Commonly known as: ZESTRIL  Take 1 tablet by mouth daily.   melatonin 5 MG Tabs Take 10 mg by mouth at bedtime as needed (sleep).   metFORMIN  500 MG 24 hr tablet Commonly known as: GLUCOPHAGE -XR Take 1,000 mg by mouth 2 (two) times daily.   metoprolol  succinate 100 MG 24 hr tablet Commonly known as: TOPROL -XL TAKE 1 TABLET(100 MG) BY  MOUTH TWICE DAILY WITH OR IMMEDIATELY FOLLOWING A MEAL   multivitamin tablet Take 1 tablet by mouth daily.   nitroGLYCERIN  0.4 MG SL tablet Commonly known as: NITROSTAT  PLACE 1 TABLET UNDER THE TONGUE EVERY 5 MINUTES AS NEEDED FOR CHEST PAIN. CALL 911 IF YOU HAVE TAKEN 3 TABLETS FOR CHEST PAIN   nortriptyline  25 MG capsule Commonly known as: PAMELOR  Take 50 mg by mouth at bedtime.   nystatin  cream Commonly known as: MYCOSTATIN  Apply topically 2 (two) times daily for 5 days.   omega-3 acid ethyl esters 1 g capsule Commonly known as: LOVAZA  Take 1 g by mouth 3 (three) times daily.   omeprazole 20 MG capsule Commonly known as: PRILOSEC Take 20 mg by mouth daily.    ondansetron  4 MG disintegrating tablet Commonly known as: ZOFRAN -ODT Take 4 mg by mouth every 8 (eight) hours as needed for nausea or vomiting.   polyethylene glycol 17 g packet Commonly known as: MIRALAX  / GLYCOLAX  Take 17 g by mouth 3 (three) times daily.   rosuvastatin  40 MG tablet Commonly known as: CRESTOR  TAKE 1 TABLET(40 MG) BY MOUTH DAILY   solifenacin 10 MG tablet Commonly known as: VESICARE Take 10 mg by mouth daily.   sulfamethoxazole -trimethoprim  800-160 MG tablet Commonly known as: BACTRIM  DS Take 1 tablet by mouth 2 (two) times daily for 12 days.   T.E.D. Below Knee/Medium Misc 1 each by Does not apply route daily. Measure legs and give appropriate size.        Follow-up Information     Lorel Maxie LABOR, MD. Go on 08/14/2023.   Specialty: Family Medicine Why: Go at 9:40am. Contact information: [address] [place] [facility] [address] [place] [address] [phone]                 Subjective   Pt reports ***  All questions and concerns were addressed at time of discharge.  Objective  Blood pressure (!) 137/111, pulse (!) 124, temperature 98.1 F (36.7 C), temperature source Oral, resp. rate 16, height 5' 6 (1.676 m), weight 63.4 kg, SpO2 100%.   General: Pt is alert, awake, not in acute distress Cardiovascular: RRR, S1/S2 +, no rubs, no gallops Respiratory: CTA bilaterally, no wheezing, no rhonchi Abdominal: Soft, NT, ND, bowel sounds + Extremities: no edema, no cyanosis  The results of significant diagnostics from this hospitalization (including imaging, microbiology, ancillary and laboratory) are listed below for reference.   Imaging studies: CT ABDOMEN PELVIS W CONTRAST Result Date: 07/30/2023 CLINICAL DATA:  Lethargy. Urinary tract infection. Dysuria. The patient is wheelchair-bound. EXAM: CT ABDOMEN AND PELVIS WITH CONTRAST TECHNIQUE: Multidetector CT imaging of the abdomen and pelvis was performed using the standard protocol following bolus  administration of intravenous contrast. RADIATION DOSE REDUCTION: This exam was performed according to the departmental dose-optimization program which includes automated exposure control, adjustment of the mA and/or kV according to patient size and/or use of iterative reconstruction technique. CONTRAST:  OMNIPAQUE  IOHEXOL  300 MG/ML  SOLN COMPARISON:  03/05/2022 FINDINGS: Lower chest: Dependent subsegmental atelectasis in both lower lobes. Hepatobiliary: Dependent gallstones in the gallbladder. Pancreas: Unremarkable Spleen: Unremarkable Adrenals/Urinary Tract: The adrenal glands and kidneys appear normal. Diffuse wall thickening of the urinary bladder, suspicious for cystitis. Stomach/Bowel: Circumferential wall thickening of the rectum as shown on prior exams including 03/05/2022, presumably from chronic inflammation. Upper normal amount of stool in the colon. There is edema in the left abdominal small bowel mesentery for example image 56 series 2, nonspecific but potentially a manifestation of  enteritis. No dilated small bowel is currently seen. Appendix surgically absent. Vascular/Lymphatic: Small retroperitoneal lymph nodes observed. No substantial degree of atherosclerosis. Reproductive: New fluid density lesions with enhancing margins in the prostate gland bilaterally as on images 93 through 96 of series 2, concerning for prostatic abscesses. Other: No supplemental non-categorized findings. Musculoskeletal: Prominent muscular atrophy involving the pelvic musculature and upper thigh musculature. Severely dysplastic and chronically dislocated hips. IMPRESSION: 1. New fluid density lesions with enhancing margins in the prostate gland bilaterally, concerning for prostatic abscesses. 2. Diffuse wall thickening of the urinary bladder, suspicious for cystitis. 3. Edema in the left abdominal small bowel mesentery, nonspecific but potentially a manifestation of enteritis. 4. Circumferential wall thickening of  the rectum as shown on prior exams including 03/05/2022, presumably from chronic inflammation. 5. Cholelithiasis. 6. Prominent muscular atrophy involving the pelvic musculature and upper thigh musculature. 7. Severely dysplastic and chronically dislocated hips. Electronically Signed   By: Ryan Salvage M.D.   On: 07/30/2023 17:04   DG Chest Port 1 View Result Date: 07/30/2023 CLINICAL DATA:  Concern for sepsis. EXAM: PORTABLE CHEST 1 VIEW COMPARISON:  04/07/2022. FINDINGS: Low lung volumes. Patient is rotated to the right. The heart size and mediastinal contours are otherwise within normal limits. Both lungs are clear. No pleural effusion or pneumothorax. No acute osseous abnormality. IMPRESSION: Low lung volumes. No acute cardiopulmonary findings Electronically Signed   By: Harrietta Sherry M.D.   On: 07/30/2023 15:16    Labs: Basic Metabolic Panel: Recent Labs  Lab 07/30/23 1437 07/30/23 2137 07/31/23 0149 08/01/23 0512  NA 131*  --  133* 135  K 6.3* 4.3 4.4 4.4  CL 102  --  99 99  CO2 25  --  24 25  GLUCOSE 190*  --  159* 206*  BUN 19  --  14 16  CREATININE 1.81*  --  1.41* 1.30*  CALCIUM  8.8*  --  8.8* 9.8   CBC: Recent Labs  Lab 07/30/23 1437 07/31/23 0149 08/01/23 0512  WBC 10.1 6.3 4.9  NEUTROABS 8.7*  --   --   HGB 13.0 12.6* 12.8*  HCT 41.6 38.0* 39.3  MCV 84.4 81.5 82.9  PLT 259 223 228   Microbiology: Results for orders placed or performed during the hospital encounter of 07/30/23  Blood Culture (routine x 2)     Status: None (Preliminary result)   Collection Time: 07/30/23  2:37 PM   Specimen: BLOOD  Result Value Ref Range Status   Specimen Description BLOOD BLOOD LEFT ARM  Final   Special Requests   Final    BOTTLES DRAWN AEROBIC AND ANAEROBIC Blood Culture results may not be optimal due to an inadequate volume of blood received in culture bottles   Culture   Final    NO GROWTH 3 DAYS Performed at [facility], [address],  [place], [place] [address]    Report Status PENDING  Incomplete  Blood Culture (routine x 2)     Status: None (Preliminary result)   Collection Time: 07/30/23  2:42 PM   Specimen: BLOOD  Result Value Ref Range Status   Specimen Description BLOOD LEFT ANTECUBITAL  Final   Special Requests   Final    BOTTLES DRAWN AEROBIC AND ANAEROBIC Blood Culture results may not be optimal due to an inadequate volume of blood received in culture bottles   Culture   Final    NO GROWTH 3 DAYS Performed at [facility], [address], [place], [place]  [address]    Report Status PENDING  Incomplete  Resp panel by RT-PCR (RSV, Flu A&B, Covid) Anterior Nasal Swab     Status: None   Collection Time: 07/30/23  2:51 PM   Specimen: Anterior Nasal Swab  Result Value Ref Range Status   SARS Coronavirus 2 by RT PCR NEGATIVE NEGATIVE Final    Comment: (NOTE) SARS-CoV-2 target nucleic acids are NOT DETECTED.  The SARS-CoV-2 RNA is generally detectable in upper respiratory specimens during the acute phase of infection. The lowest concentration of SARS-CoV-2 viral copies this assay can detect is 138 copies/mL. A negative result does not preclude SARS-Cov-2 infection and should not be used as the sole basis for treatment or other patient management decisions. A negative result may occur with  improper specimen collection/handling, submission of specimen other than nasopharyngeal swab, presence of viral mutation(s) within the areas targeted by this assay, and inadequate number of viral copies(<138 copies/mL). A negative result must be combined with clinical observations, patient history, and epidemiological information. The expected result is Negative.  Fact Sheet for Patients:  BloggerCourse.com  Fact Sheet for Healthcare Providers:  SeriousBroker.it  This test is no t yet approved or cleared by the [place]  FDA and  has been authorized for  detection and/or diagnosis of SARS-CoV-2 by FDA under an Emergency Use Authorization (EUA). This EUA will remain  in effect (meaning this test can be used) for the duration of the COVID-19 declaration under Section 564(b)(1) of the Act, 21 U.S.C.section 360bbb-3(b)(1), unless the authorization is terminated  or revoked sooner.       Influenza A by PCR NEGATIVE NEGATIVE Final   Influenza B by PCR NEGATIVE NEGATIVE Final    Comment: (NOTE) The Xpert Xpress SARS-CoV-2/FLU/RSV plus assay is intended as an aid in the diagnosis of influenza from Nasopharyngeal swab specimens and should not be used as a sole basis for treatment. Nasal washings and aspirates are unacceptable for Xpert Xpress SARS-CoV-2/FLU/RSV testing.  Fact Sheet for Patients: BloggerCourse.com  Fact Sheet for Healthcare Providers: SeriousBroker.it  This test is not yet approved or cleared by the [place]  FDA and has been authorized for detection and/or diagnosis of SARS-CoV-2 by FDA under an Emergency Use Authorization (EUA). This EUA will remain in effect (meaning this test can be used) for the duration of the COVID-19 declaration under Section 564(b)(1) of the Act, 21 U.S.C. section 360bbb-3(b)(1), unless the authorization is terminated or revoked.     Resp Syncytial Virus by PCR NEGATIVE NEGATIVE Final    Comment: (NOTE) Fact Sheet for Patients: BloggerCourse.com  Fact Sheet for Healthcare Providers: SeriousBroker.it  This test is not yet approved or cleared by the [place]  FDA and has been authorized for detection and/or diagnosis of SARS-CoV-2 by FDA under an Emergency Use Authorization (EUA). This EUA will remain in effect (meaning this test can be used) for the duration of the COVID-19 declaration under Section 564(b)(1) of the Act, 21 U.S.C. section 360bbb-3(b)(1), unless the authorization is  terminated or revoked.  Performed at [facility], [address] [place], [place] [address]   Urine Culture     Status: Abnormal   Collection Time: 07/30/23  5:28 PM   Specimen: Urine, Random  Result Value Ref Range Status   Specimen Description   Final    URINE, RANDOM Performed at [facility], [address] [place], [place] [address]    Special Requests   Final    NONE Reflexed from [id number] Performed at [facility], [address] [place], [place] [address]    Culture (A)  Final    <10,000 COLONIES/mL INSIGNIFICANT GROWTH Performed at [facility], [address], [place], [place] [address]    Report Status 07/31/2023 FINAL  Final    Time coordinating discharge: Over 30 minutes  Marien LITTIE Piety, MD  [facility] 08/02/2023, 11:04 AM

## 2023-08-02 NOTE — TOC Transition Note (Addendum)
 Transition of Care [facility]) - Discharge Note   Patient Details  Name: Cory Campbell MRN: [id number] Date of Birth: 10-22-1980  [facility] of [facility]) CM/SW Contact:  Lauraine JAYSON Carpen, LCSW Phone Number: 08/02/2023, 11:49 AM   Clinical Narrative:  Patient has orders to discharge home today. He prefers to set up a Lyft to get home and feels safe and strong enough to do so. He has the Lyft app on his phone to arrange. He will have his wife bring out his wheelchair when he arrives. No further concerns. CSW signing off.   12:37 pm: Patient told RN he cannot pay for Lyft. Cab voucher faxed to [employer]. Patient signed rider waiver. He again confirmed he feels safe and strong enough to go in a cab. CSW signing off.  Final next level of care: Home/Self Care Barriers to Discharge: Barriers Resolved   Patient Goals and CMS Choice            Discharge Placement                Patient to be transferred to facility by: Lyft   Patient and family notified of of transfer: 08/02/23  Discharge Plan and Services Additional resources added to the After Visit Summary for       Post Acute Care Choice: NA                               Social Drivers of Health (SDOH) Interventions SDOH Screenings   Food Insecurity: No Food Insecurity (07/30/2023)  Housing: Unknown (07/30/2023)  Transportation Needs: No Transportation Needs (07/31/2023)  Utilities: Not At Risk (07/30/2023)  Financial Resource Strain: Low Risk  (04/18/2021)   Received from [facility]  Tobacco Use: Medium Risk (07/30/2023)     Readmission Risk Interventions    08/02/2023   10:07 AM 03/03/2022    4:01 PM 09/11/2021   10:27 AM  Readmission Risk Prevention Plan  Transportation Screening Complete Complete Complete  PCP or Specialist Appt within 3-5 Days Complete  Complete  HRI or Home Care Consult   Complete  Social Work Consult for Recovery Care Planning/Counseling Complete  Complete  Palliative Care  Screening Not Applicable  Not Applicable  Medication Review Oceanographer) Complete Complete Complete  PCP or Specialist appointment within 3-5 days of discharge  Complete   HRI or Home Care Consult  Complete   SW Recovery Care/Counseling Consult  Complete   Palliative Care Screening  Not Applicable   Skilled Nursing Facility  Not Applicable

## 2023-08-02 NOTE — Inpatient Diabetes Management (Signed)
 Inpatient Diabetes Program Recommendations  AACE/ADA: New Consensus Statement on Inpatient Glycemic Control  Target Ranges:  Prepandial:   less than 140 mg/dL      Peak postprandial:   less than 180 mg/dL (1-2 hours)      Critically ill patients:  140 - 180 mg/dL    Latest Reference Range & Units 08/01/23 07:51 08/01/23 11:36 08/01/23 17:06  Glucose-Capillary 70 - 99 mg/dL 830 (H) 710 (H) 782 (H)   Review of Glycemic Control  Diabetes history: DM2 Outpatient Diabetes medications: Tresiba  30 units daily, Novolog  30 units TID with meals, Metformin XR 1000 mg BID Current orders for Inpatient glycemic control: Semglee  16 units BID, Novolog  0-9 units TID with meals, Metformin XR 1000 mg BID  Inpatient Diabetes Program Recommendations:    Insulin : Please consider ordering Novolog  3 units TID with meals for meal coverage if patient eats at least 50% of meals.  Thanks, Earnie Gainer, RN, MSN, CDCES Diabetes Coordinator Inpatient Diabetes Program [phone] (Team Pager from 8am to 5pm)

## 2023-08-02 NOTE — TOC Initial Note (Addendum)
 Transition of Care [facility]) - Initial/Assessment Note    Patient Details  Name: Cory Campbell MRN: [id number] Date of Birth: 08/24/1980  Transition of Care [facility]) CM/SW Contact:    Lauraine JAYSON Carpen, LCSW Phone Number: 08/02/2023, 10:10 AM  Clinical Narrative:                 Readmission prevention screen complete. CSW met with patient. No family at bedside. CSW introduced role and explained that discharge planning would be discussed. PCP is Maxie Buckles, MD. Patient uses [facility] through [facility] to get to his appointments. Pharmacy is [facility] [place]. No issues obtaining medications. Patient lives at home with his wife and son. No home health prior to admission. He has a motorized wheelchair and shower chair. Patient will need transportation home. His wheelchair is not at the hospital. CSW left voicemail for Medicaid Transportation to see if we can get authorization for EMS transport. Address on facesheet is correct.   10:33 am: Per Designer, fashion/clothing, patient would not qualify for ambulance because he is wheelchair bound. Medicaid Transportation may be able to provide a ride if family can bring his wheelchair out when he arrives home. Patient will call his wife and confirm.  Expected Discharge Plan: Home/Self Care Barriers to Discharge: Transportation   Patient Goals and CMS Choice            Expected Discharge Plan and Services     Post Acute Care Choice: NA Living arrangements for the past 2 months: Apartment Expected Discharge Date: 08/02/23                                    Prior Living Arrangements/Services Living arrangements for the past 2 months: Apartment Lives with:: Spouse, Minor Children Patient language and need for interpreter reviewed:: Yes Do you feel safe going back to the place where you live?: Yes      Need for Family Participation in Patient Care: Yes (Comment) Care giver support system in place?: Yes (comment) Current  home services: DME Criminal Activity/Legal Involvement Pertinent to Current Situation/Hospitalization: No - Comment as needed  Activities of Daily Living   ADL Screening (condition at time of admission) Independently performs ADLs?: No Does the patient have a NEW difficulty with bathing/dressing/toileting/self-feeding that is expected to last >3 days?: No Does the patient have a NEW difficulty with getting in/out of bed, walking, or climbing stairs that is expected to last >3 days?: No Does the patient have a NEW difficulty with communication that is expected to last >3 days?: No Is the patient deaf or have difficulty hearing?: No Does the patient have difficulty seeing, even when wearing glasses/contacts?: No Does the patient have difficulty concentrating, remembering, or making decisions?: No  Permission Sought/Granted                  Emotional Assessment Appearance:: Appears stated age Attitude/Demeanor/Rapport: Engaged, Gracious Affect (typically observed): Accepting, Appropriate, Calm, Pleasant Orientation: : Oriented to Self, Oriented to Place, Oriented to  Time, Oriented to Situation Alcohol / Substance Use: Not Applicable Psych Involvement: No (comment)  Admission diagnosis:  Prostatic abscess [N41.2] Prostate abscess [N41.2] Lethargy [R53.83] Cystitis [N30.90] Patient Active Problem List   Diagnosis Date Noted   Prostate abscess 08/02/2023   Prostatic abscess 07/30/2023   Acute renal failure superimposed on stage 3a chronic kidney disease (HCC) 07/30/2023   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) 07/30/2023   Severe sepsis (HCC) 07/30/2023   Cystitis 07/27/2023   Dysuria 07/27/2023   History of hypertension 07/27/2023   Renal insufficiency 04/13/2022   End stage renal disease (HCC) 04/11/2022   HLD (hyperlipidemia) 04/11/2022   Hyperkalemia 03/02/2022   Hyponatremia 03/02/2022   Metabolic acidosis 03/02/2022   UTI (urinary tract infection)  11/24/2021   Proctitis 11/24/2021   Diabetes mellitus without complication (HCC) 11/24/2021   Hypokalemia 11/24/2021   Pancreatitis 11/24/2021   Asthma 11/24/2021   Depression with anxiety 11/24/2021   Overweight (BMI 25.0-29.9) 11/24/2021   BPH (benign prostatic hyperplasia) 11/24/2021   Chronic diastolic CHF (congestive heart failure) (HCC) 11/24/2021   Elevated lactic acid level 11/24/2021   Pedal edema 09/12/2021   Bilateral foot pain 09/09/2021   Lactic acidosis 09/09/2021   Tobacco dependence 09/09/2021   Weakness    AKI (acute kidney injury) (HCC) 06/05/2021   Postural dizziness with presyncope 06/05/2021   Arthrogryposis, with contractures and functional paraplegia    Seizure disorder (HCC)    Headache    Acute asthma exacerbation 03/30/2020   Acute respiratory failure with hypoxia (HCC) 03/29/2020   Surgical wound, non healing    Acute colitis 05/20/2017   Colitis    Acute renal failure (HCC) 05/15/2017   Diarrhea 05/15/2017   Sepsis (HCC) 05/15/2017   Abdominal pain 11/25/2016   HIV (human immunodeficiency virus infection) (HCC) 11/19/2016   Hypotension 10/08/2016   Unstable angina (HCC) 10/06/2016   Shortness of breath 06/23/2015   Obesity 05/18/2014   Frequent falls 08/12/2013   Pain of left clavicle 08/12/2013   Tachycardia 01/21/2013   Chronic pain syndrome 08/12/2012   Sleep apnea 06/07/2012   Chest pain 06/20/2011   Hypertension associated with diabetes (HCC) 06/20/2011   Insulin  dependent type 2 diabetes mellitus (HCC) 06/20/2011   Diabetes mellitus (HCC) 06/20/2011   Asthma, chronic, unspecified asthma severity, with acute exacerbation 01/18/2008   Hyperlipidemia associated with type 2 diabetes mellitus (HCC) 08/28/2006   Essential hypertension 06/27/2005   PCP:  Lorel Maxie LABOR, MD Pharmacy:   [facility] [phone] [address], [place] - [address] AT [facility] [address] [place] [address] Phone: [phone]  Fax: [phone]     Social Drivers of Health (SDOH) Social History: SDOH Screenings   Food Insecurity: No Food Insecurity (07/30/2023)  Housing: Unknown (07/30/2023)  Transportation Needs: No Transportation Needs (07/31/2023)  Utilities: Not At Risk (07/30/2023)  Financial Resource Strain: Low Risk  (04/18/2021)   Received from [facility]  Tobacco Use: Medium Risk (07/30/2023)   SDOH Interventions:     Readmission Risk Interventions    08/02/2023   10:07 AM 03/03/2022    4:01 PM 09/11/2021   10:27 AM  Readmission Risk Prevention Plan  Transportation Screening Complete Complete Complete  PCP or Specialist Appt within 3-5 Days Complete  Complete  HRI or Home Care Consult   Complete  Social Work Consult for Recovery Care Planning/Counseling Complete  Complete  Palliative Care Screening Not Applicable  Not Applicable  Medication Review Oceanographer) Complete Complete Complete  PCP or Specialist appointment within 3-5 days of discharge  Complete   HRI or Home Care Consult  Complete   SW Recovery Care/Counseling Consult  Complete   Palliative Care Screening  Not Applicable   Skilled Nursing Facility  Not Applicable

## 2023-08-02 NOTE — Progress Notes (Incomplete)
 PROGRESS NOTE  Cory Campbell    DOB: 1980/08/03, 43 y.o.  FMW:[id number]    Code Status: Full Code   DOA: 07/30/2023   LOS: 3   Brief hospital course  Cory Campbell is a 43 y.o. male with medical history significant of arthrogryposis multiplex congenita, wheelchair-bound, s/p of partial colectomy, diverticulitis, s/p of corneal transplantation, hypertension, hyperlipidemia, diabetes mellitus, asthma, hypothyroidism, depression with anxiety, remote seizure not on any medication, HIV, chronic pain syndrome.  BPH, cCHF,  CKD-3a, who presents with increased urinary frequency, fever, lower back pain. ED Course: WBC 10.1, UA (hazy appearance, large amount of leukocyte, negative bacteria, WBC> 50), potassium 6.3, worsening renal function, negative PCR for COVID, flu and RSV, temperature one 1.2, heart rate 96, RR 22, oxygen saturation 98% on room air.  Chest x-ray negative.  CT scan of abdomen/pelvis: 1. New fluid density lesions with enhancing margins in the prostate gland bilaterally, concerning for prostatic abscesses. 2. Diffuse wall thickening of the urinary bladder, suspicious for cystitis. 3. Edema in the left abdominal small bowel mesentery, nonspecific but potentially a manifestation of enteritis. 4. Circumferential wall thickening of the rectum as shown on prior exams including 03/05/2022, presumably from chronic inflammation. 5. Cholelithiasis. 6. Prominent muscular atrophy involving the pelvic musculature and upper thigh musculature. 7. Severely dysplastic and chronically dislocated hips.   Patient was admitted to medicine service for further workup and management of complicated UTI as outlined in detail below.  08/02/23 -clinically stable. Urology consulted. Expect dc tomorrow if continues to do well. At least 48hr IV Abx  Assessment & Plan  Principal Problem:   Prostatic abscess Active Problems:   Severe sepsis (HCC)   Hypotension   Hyperkalemia   Acute renal failure  superimposed on stage 3a chronic kidney disease (HCC)   Hypertension associated with diabetes (HCC)   HLD (hyperlipidemia)   Type II diabetes mellitus with renal manifestations (HCC)   Chronic pain syndrome   Asthma   HIV (human immunodeficiency virus infection) (HCC)   BPH (benign prostatic hyperplasia)   Chronic diastolic CHF (congestive heart failure) (HCC)   Depression with anxiety  Severe sepsis due prostatic abscess  UTI: sepsis criteria resolved.  Urology following. Continue IV Abx. Abscess not amenable to drainage at this time.  -Started IV  meropenem (Patient received 1 dose of Rocephin  in ED). - Follow-up blood culture and urine culture. No significant growth at this time.   Hypotension in chronic hypertension: due to severe sepsis - Held all blood pressure medications on admission and titrating back on as tolerated   Hyperkalemia: Potassium 6.3> 4.4. resolved s/p 1 g calcium  gluconate, sodium bicarbonate , NovoLog  10 unit, D50, 10 g Lokelma  -  BMP am  Possible yeast infection on genitals related to Abx use.  - antifungal cream    AKI superimposed on stage 3a chronic kidney disease (HCC): now back to baseline -Avoid using all renal toxic medications.-    HLD  Type II diabetes mellitus with renal manifestations [facility]): Recent A1c 8.1, poorly controlled.  Patient taking Trulicity, Tresiba  24 units twice daily -SSI - Tresiba  16 units twice daily and sliding scale   Chronic pain syndrome -Continue home buprenorphine  patch, gabapentin    Asthma -Bronchodilators and as needed Mucinex    HIV (human immunodeficiency virus infection) (HCC): CD4 was 232 on 05/16/17 - Patient on Cbenuva injection   BPH (benign prostatic hyperplasia) -Proscar   - monitor UOP   Chronic diastolic CHF (congestive heart failure) (HCC): 2D echo 10/08/2016 showed EF 50-55%.  Patient is clinically dry.  BNP 16.6. - Watch volume status closely   Depression with anxiety -Continue home  medications  Body mass index is 22.56 kg/m.  VTE ppx: heparin  injection 5,000 Units Start: 07/31/23 0600  Diet:     Diet   Diet Carb Modified Fluid consistency: Thin; Room service appropriate? Yes   Consultants: Urology   Subjective 08/02/23    Pt reports no urinary symptoms today. Tolerating diet well. Asks for his pain medication patch to be replaced today   Objective  Blood pressure 123/85, pulse 87, temperature 98.2 F (36.8 C), resp. rate 16, height 5' 6 (1.676 m), weight 66.5 kg, SpO2 100%.  Intake/Output Summary (Last 24 hours) at 08/02/2023 0721 Last data filed at 08/02/2023 9386 Gross per 24 hour  Intake 980 ml  Output 2325 ml  Net -1345 ml   Filed Weights   07/31/23 0412 08/01/23 0459 08/02/23 0500  Weight: 66.5 kg 64.7 kg 63.4 kg    Physical Exam:  General: awake, alert, NAD HEENT: atraumatic, clear conjunctiva, anicteric sclera, MMM, hearing grossly normal Respiratory: normal respiratory effort. Cardiovascular: quick capillary refill Gastrointestinal: soft, NT, ND Nervous: A&O x3. no gross focal neurologic deficits, normal speech Extremities: lower extremity chronic deformity with muscle wasting  Skin: dry, intact, normal temperature, normal color. No rashes, lesions or ulcers on exposed skin Psychiatry: normal mood, congruent affect  Labs   I have personally reviewed the following labs and imaging studies CBC    Component Value Date/Time   WBC 4.9 08/01/2023 0512   RBC 4.74 08/01/2023 0512   HGB 12.8 (L) 08/01/2023 0512   HGB 9.2 (L) 05/16/2017 1521   HCT 39.3 08/01/2023 0512   HCT 28.3 (L) 05/16/2017 1521   PLT 228 08/01/2023 0512   PLT 798 (H) 05/16/2017 1521   MCV 82.9 08/01/2023 0512   MCV 85 05/16/2017 1521   MCV 85 08/28/2013 0024   MCH 27.0 08/01/2023 0512   MCHC 32.6 08/01/2023 0512   RDW 14.5 08/01/2023 0512   RDW 13.8 05/16/2017 1521   RDW 13.1 08/28/2013 0024   LYMPHSABS 0.7 07/30/2023 1437   LYMPHSABS 0.7 05/16/2017 1521    MONOABS 0.6 07/30/2023 1437   EOSABS 0.1 07/30/2023 1437   EOSABS 0.0 05/16/2017 1521   BASOSABS 0.0 07/30/2023 1437   BASOSABS 0.0 05/16/2017 1521      Latest Ref Rng & Units 08/01/2023    5:12 AM 07/31/2023    1:49 AM 07/30/2023    9:37 PM  BMP  Glucose 70 - 99 mg/dL 793  840    BUN 6 - 20 mg/dL 16  14    Creatinine 9.38 - 1.24 mg/dL 8.69  8.58    Sodium 864 - 145 mmol/L 135  133    Potassium 3.5 - 5.1 mmol/L 4.4  4.4  4.3   Chloride 98 - 111 mmol/L 99  99    CO2 22 - 32 mmol/L 25  24    Calcium  8.9 - 10.3 mg/dL 9.8  8.8      No results found.   Disposition Plan & Communication  Patient status: Inpatient  Admitted From: Home Planned disposition location: Home Anticipated discharge date: TBD pending IV Abx completed   Family Communication: none at bedside    Author: Marien LITTIE Piety, DO [facility] 08/02/2023, 7:21 AM   Available by Epic secure chat 7AM-7PM. If 7PM-7AM, please contact night-coverage.  [facility] contact information found on [email or web address].

## 2023-08-02 NOTE — Plan of Care (Signed)

## 2023-08-04 LAB — CULTURE, BLOOD (ROUTINE X 2)
Culture: NO GROWTH
Culture: NO GROWTH

## 2023-08-06 ENCOUNTER — Telehealth: Payer: Self-pay | Admitting: Physician Assistant

## 2023-08-06 NOTE — Telephone Encounter (Signed)
 He was discharged from the hospital last week on Bactrim  DS twice daily.  Please extend this course for total of 4 weeks of antibiotics including what he received in the hospital and schedule him for outpatient follow-up with Dr. Twylla in 3-4 weeks with CT pelvis with contrast prior.

## 2023-08-07 MED ORDER — SULFAMETHOXAZOLE-TRIMETHOPRIM 800-160 MG PO TABS: 1.0000 | ORAL_TABLET | Freq: Two times a day (BID) | ORAL | 0 refills | Status: AC

## 2023-08-07 NOTE — Telephone Encounter (Signed)
 2nd attempt to reach pt, LVM for pt to return call

## 2023-08-07 NOTE — Telephone Encounter (Signed)
 LVM for pt to return call.   Sent in 16 days of bactrim  to complete 4 week course of antibiotics.

## 2023-08-15 NOTE — Telephone Encounter (Signed)
 Pt states he made an appointment with his urologist at [facility] for 08/30/2023.

## 2023-08-15 NOTE — Telephone Encounter (Signed)
 Fine to follow up with [facility] urology per his preference. I recommend he seen them 4-6 weeks after his recent discharge for outpatient follow up of his prostate abscesses.

## 2023-08-15 NOTE — Telephone Encounter (Signed)
 Pt was informed that 16 additional days of bactrim  was sent to his pharmacy. Pt states he already has an appointment with [facility] urology in May, pt was informed that we would like for him to come here in about 3-4 weeks for a follow up with Dr.stoioff. pt asked if he could have his follow up with his urologist in [employer], please advise.

## 2023-08-30 ENCOUNTER — Other Ambulatory Visit: Payer: Self-pay

## 2023-08-30 ENCOUNTER — Inpatient Hospital Stay
Admission: EM | Admit: 2023-08-30 | Discharge: 2023-09-02 | DRG: 683 | Disposition: A | Discharge status: Home / Self Care | Attending: Internal Medicine | Admitting: Internal Medicine

## 2023-08-30 ENCOUNTER — Encounter: Payer: Self-pay | Admitting: *Deleted

## 2023-08-30 DIAGNOSIS — Z7951 Long term (current) use of inhaled steroids: Secondary | ICD-10-CM | POA: Diagnosis not present

## 2023-08-30 DIAGNOSIS — Z21 Asymptomatic human immunodeficiency virus [HIV] infection status: Secondary | ICD-10-CM | POA: Diagnosis present

## 2023-08-30 DIAGNOSIS — Z794 Long term (current) use of insulin: Secondary | ICD-10-CM

## 2023-08-30 DIAGNOSIS — F32A Depression, unspecified: Secondary | ICD-10-CM | POA: Diagnosis present

## 2023-08-30 DIAGNOSIS — K219 Gastro-esophageal reflux disease without esophagitis: Secondary | ICD-10-CM | POA: Diagnosis present

## 2023-08-30 DIAGNOSIS — Z87891 Personal history of nicotine dependence: Secondary | ICD-10-CM | POA: Diagnosis not present

## 2023-08-30 DIAGNOSIS — Z993 Dependence on wheelchair: Secondary | ICD-10-CM

## 2023-08-30 DIAGNOSIS — J45909 Unspecified asthma, uncomplicated: Secondary | ICD-10-CM | POA: Diagnosis present

## 2023-08-30 DIAGNOSIS — N179 Acute kidney failure, unspecified: Principal | ICD-10-CM | POA: Diagnosis present

## 2023-08-30 DIAGNOSIS — E872 Acidosis, unspecified: Secondary | ICD-10-CM | POA: Diagnosis present

## 2023-08-30 DIAGNOSIS — E785 Hyperlipidemia, unspecified: Secondary | ICD-10-CM | POA: Diagnosis present

## 2023-08-30 DIAGNOSIS — N051 Unspecified nephritic syndrome with focal and segmental glomerular lesions: Secondary | ICD-10-CM | POA: Diagnosis present

## 2023-08-30 DIAGNOSIS — N183 Chronic kidney disease, stage 3 unspecified: Secondary | ICD-10-CM

## 2023-08-30 DIAGNOSIS — Z880 Allergy status to penicillin: Secondary | ICD-10-CM

## 2023-08-30 DIAGNOSIS — F419 Anxiety disorder, unspecified: Secondary | ICD-10-CM | POA: Diagnosis present

## 2023-08-30 DIAGNOSIS — Z947 Corneal transplant status: Secondary | ICD-10-CM

## 2023-08-30 DIAGNOSIS — J4520 Mild intermittent asthma, uncomplicated: Secondary | ICD-10-CM

## 2023-08-30 DIAGNOSIS — G40909 Epilepsy, unspecified, not intractable, without status epilepticus: Secondary | ICD-10-CM | POA: Diagnosis present

## 2023-08-30 DIAGNOSIS — G4733 Obstructive sleep apnea (adult) (pediatric): Secondary | ICD-10-CM | POA: Diagnosis present

## 2023-08-30 DIAGNOSIS — Z79899 Other long term (current) drug therapy: Secondary | ICD-10-CM

## 2023-08-30 DIAGNOSIS — D631 Anemia in chronic kidney disease: Secondary | ICD-10-CM | POA: Diagnosis present

## 2023-08-30 DIAGNOSIS — E1165 Type 2 diabetes mellitus with hyperglycemia: Secondary | ICD-10-CM | POA: Diagnosis present

## 2023-08-30 DIAGNOSIS — N189 Chronic kidney disease, unspecified: Secondary | ICD-10-CM | POA: Diagnosis not present

## 2023-08-30 DIAGNOSIS — E1140 Type 2 diabetes mellitus with diabetic neuropathy, unspecified: Secondary | ICD-10-CM | POA: Diagnosis present

## 2023-08-30 DIAGNOSIS — E871 Hypo-osmolality and hyponatremia: Secondary | ICD-10-CM | POA: Diagnosis present

## 2023-08-30 DIAGNOSIS — Z7984 Long term (current) use of oral hypoglycemic drugs: Secondary | ICD-10-CM | POA: Diagnosis not present

## 2023-08-30 DIAGNOSIS — I5032 Chronic diastolic (congestive) heart failure: Secondary | ICD-10-CM | POA: Diagnosis present

## 2023-08-30 DIAGNOSIS — Z881 Allergy status to other antibiotic agents status: Secondary | ICD-10-CM

## 2023-08-30 DIAGNOSIS — I130 Hypertensive heart and chronic kidney disease with heart failure and stage 1 through stage 4 chronic kidney disease, or unspecified chronic kidney disease: Secondary | ICD-10-CM | POA: Diagnosis present

## 2023-08-30 DIAGNOSIS — N400 Enlarged prostate without lower urinary tract symptoms: Secondary | ICD-10-CM | POA: Diagnosis present

## 2023-08-30 DIAGNOSIS — N184 Chronic kidney disease, stage 4 (severe): Secondary | ICD-10-CM | POA: Diagnosis present

## 2023-08-30 DIAGNOSIS — I10 Essential (primary) hypertension: Secondary | ICD-10-CM | POA: Diagnosis present

## 2023-08-30 DIAGNOSIS — E1122 Type 2 diabetes mellitus with diabetic chronic kidney disease: Secondary | ICD-10-CM | POA: Diagnosis present

## 2023-08-30 DIAGNOSIS — N269 Renal sclerosis, unspecified: Secondary | ICD-10-CM | POA: Diagnosis present

## 2023-08-30 DIAGNOSIS — G894 Chronic pain syndrome: Secondary | ICD-10-CM | POA: Diagnosis present

## 2023-08-30 LAB — URINALYSIS, ROUTINE W REFLEX MICROSCOPIC
Bacteria, UA: NONE SEEN
Bilirubin Urine: NEGATIVE
Glucose, UA: NEGATIVE mg/dL
Ketones, ur: NEGATIVE mg/dL
Leukocytes,Ua: NEGATIVE
Nitrite: NEGATIVE
Protein, ur: 30 mg/dL — AB
Specific Gravity, Urine: 1.009 (ref 1.005–1.030)
pH: 6 (ref 5.0–8.0)

## 2023-08-30 LAB — CBC
HCT: 41.2 % (ref 39.0–52.0)
Hemoglobin: 13 g/dL (ref 13.0–17.0)
MCH: 26.4 pg (ref 26.0–34.0)
MCHC: 31.6 g/dL (ref 30.0–36.0)
MCV: 83.7 fL (ref 80.0–100.0)
Platelets: 287 K/uL (ref 150–400)
RBC: 4.92 MIL/uL (ref 4.22–5.81)
RDW: 15.5 % (ref 11.5–15.5)
WBC: 6.4 K/uL (ref 4.0–10.5)
nRBC: 0 % (ref 0.0–0.2)

## 2023-08-30 LAB — BASIC METABOLIC PANEL WITH GFR
Anion gap: 15 (ref 5–15)
BUN: 60 mg/dL — ABNORMAL HIGH (ref 6–20)
CO2: 20 mmol/L — ABNORMAL LOW (ref 22–32)
Calcium: 10.9 mg/dL — ABNORMAL HIGH (ref 8.9–10.3)
Chloride: 99 mmol/L (ref 98–111)
Creatinine, Ser: 4.93 mg/dL — ABNORMAL HIGH (ref 0.61–1.24)
GFR, Estimated: 14 mL/min — ABNORMAL LOW (ref >60)
Glucose, Bld: 105 mg/dL — ABNORMAL HIGH (ref 70–99)
Potassium: 4.3 mmol/L (ref 3.5–5.1)
Sodium: 134 mmol/L — ABNORMAL LOW (ref 135–145)

## 2023-08-30 LAB — LACTIC ACID, PLASMA
Lactic Acid, Venous: 3.7 mmol/L (ref 0.5–1.9)
Lactic Acid, Venous: 2.1 mmol/L (ref 0.5–1.9)

## 2023-08-30 LAB — GLUCOSE, CAPILLARY
Glucose-Capillary: 52 mg/dL — ABNORMAL LOW (ref 70–99)
Glucose-Capillary: 57 mg/dL — ABNORMAL LOW (ref 70–99)

## 2023-08-30 MED ORDER — ACETAMINOPHEN 325 MG PO TABS
650.0000 mg | ORAL_TABLET | Freq: Four times a day (QID) | ORAL | Status: DC | PRN
  Administered 2023-08-30 – 2023-08-31 (×2): 650 mg via ORAL
  Filled 2023-08-30 (×2): qty 2

## 2023-08-30 MED ORDER — ACETAMINOPHEN 650 MG RE SUPP: 650.0000 mg | Freq: Four times a day (QID) | RECTAL | Status: DC | PRN

## 2023-08-30 MED ORDER — SODIUM CHLORIDE 0.9 % IV SOLN: INTRAVENOUS | Status: AC

## 2023-08-30 MED ORDER — ONDANSETRON HCL 4 MG PO TABS: 4.0000 mg | ORAL_TABLET | Freq: Four times a day (QID) | ORAL | Status: DC | PRN

## 2023-08-30 MED ORDER — FESOTERODINE FUMARATE ER 4 MG PO TB24
4.0000 mg | ORAL_TABLET | Freq: Every day | ORAL | Status: DC
  Administered 2023-08-31 – 2023-09-02 (×3): 4 mg via ORAL
  Filled 2023-08-30 (×3): qty 1

## 2023-08-30 MED ORDER — TRAZODONE HCL 50 MG PO TABS: 25.0000 mg | ORAL_TABLET | Freq: Every evening | ORAL | Status: DC | PRN

## 2023-08-30 MED ORDER — NITROGLYCERIN 0.4 MG SL SUBL: 0.4000 mg | SUBLINGUAL_TABLET | SUBLINGUAL | Status: DC | PRN

## 2023-08-30 MED ORDER — MAGNESIUM HYDROXIDE 400 MG/5ML PO SUSP: 30.0000 mL | Freq: Every day | ORAL | Status: DC | PRN

## 2023-08-30 MED ORDER — SODIUM CHLORIDE 0.9 % IV SOLN: Freq: Once | INTRAVENOUS | Status: AC

## 2023-08-30 MED ORDER — INSULIN GLARGINE-YFGN 100 UNIT/ML ~~LOC~~ SOLN
30.0000 [IU] | Freq: Every day | SUBCUTANEOUS | Status: DC
  Filled 2023-08-30: qty 0.3

## 2023-08-30 MED ORDER — LORATADINE 10 MG PO TABS
10.0000 mg | ORAL_TABLET | Freq: Every day | ORAL | Status: DC
  Administered 2023-08-31 – 2023-09-02 (×3): 10 mg via ORAL
  Filled 2023-08-30 (×3): qty 1

## 2023-08-30 MED ORDER — ISOSORBIDE DINITRATE 10 MG PO TABS
10.0000 mg | ORAL_TABLET | Freq: Three times a day (TID) | ORAL | Status: DC
  Administered 2023-08-31 – 2023-09-02 (×8): 10 mg via ORAL
  Filled 2023-08-30 (×10): qty 1

## 2023-08-30 MED ORDER — GLUCERNA SHAKE PO LIQD
237.0000 mL | Freq: Four times a day (QID) | ORAL | Status: DC
  Administered 2023-08-30 – 2023-09-01 (×3): 237 mL via ORAL

## 2023-08-30 MED ORDER — FLUTICASONE PROPIONATE HFA 110 MCG/ACT IN AERO: 2.0000 | INHALATION_SPRAY | Freq: Two times a day (BID) | RESPIRATORY_TRACT | Status: DC

## 2023-08-30 MED ORDER — FERROUS SULFATE 325 (65 FE) MG PO TABS
325.0000 mg | ORAL_TABLET | Freq: Every day | ORAL | Status: DC
  Administered 2023-08-31 – 2023-09-02 (×3): 325 mg via ORAL
  Filled 2023-08-30 (×3): qty 1

## 2023-08-30 MED ORDER — CYCLOBENZAPRINE HCL 10 MG PO TABS
5.0000 mg | ORAL_TABLET | Freq: Three times a day (TID) | ORAL | Status: DC | PRN
  Administered 2023-08-31 – 2023-09-01 (×2): 5 mg via ORAL
  Filled 2023-08-30 (×2): qty 1

## 2023-08-30 MED ORDER — ONDANSETRON HCL 4 MG/2ML IJ SOLN: 4.0000 mg | Freq: Four times a day (QID) | INTRAMUSCULAR | Status: DC | PRN

## 2023-08-30 MED ORDER — BUDESONIDE 0.25 MG/2ML IN SUSP
0.2500 mg | Freq: Two times a day (BID) | RESPIRATORY_TRACT | Status: DC
  Administered 2023-08-31 – 2023-09-02 (×5): 0.25 mg via RESPIRATORY_TRACT
  Filled 2023-08-30 (×5): qty 2

## 2023-08-30 MED ORDER — LABETALOL HCL 5 MG/ML IV SOLN
20.0000 mg | INTRAVENOUS | Status: DC | PRN
  Administered 2023-09-02: 20 mg via INTRAVENOUS
  Filled 2023-08-30: qty 4

## 2023-08-30 MED ORDER — DICLOFENAC SODIUM 1 % EX GEL
2.0000 g | Freq: Four times a day (QID) | CUTANEOUS | Status: DC | PRN
  Administered 2023-08-31: 2 g via TOPICAL
  Filled 2023-08-30: qty 100

## 2023-08-30 MED ORDER — ALBUTEROL SULFATE (2.5 MG/3ML) 0.083% IN NEBU
2.5000 mg | INHALATION_SOLUTION | RESPIRATORY_TRACT | Status: DC | PRN
  Filled 2023-08-30: qty 3

## 2023-08-30 MED ORDER — ROSUVASTATIN CALCIUM 20 MG PO TABS
40.0000 mg | ORAL_TABLET | Freq: Every day | ORAL | Status: DC
  Administered 2023-08-31 – 2023-09-02 (×3): 40 mg via ORAL
  Filled 2023-08-30 (×3): qty 2

## 2023-08-30 MED ORDER — ENOXAPARIN SODIUM 30 MG/0.3ML IJ SOSY
30.0000 mg | PREFILLED_SYRINGE | INTRAMUSCULAR | Status: DC
  Administered 2023-08-30 – 2023-09-01 (×3): 30 mg via SUBCUTANEOUS
  Filled 2023-08-30 (×3): qty 0.3

## 2023-08-30 MED ORDER — METOPROLOL SUCCINATE ER 50 MG PO TB24
100.0000 mg | ORAL_TABLET | Freq: Two times a day (BID) | ORAL | Status: DC
  Administered 2023-08-31 – 2023-09-02 (×5): 100 mg via ORAL
  Filled 2023-08-30 (×5): qty 2

## 2023-08-30 MED ORDER — GABAPENTIN 300 MG PO CAPS
300.0000 mg | ORAL_CAPSULE | Freq: Three times a day (TID) | ORAL | Status: DC
  Administered 2023-08-31 – 2023-09-02 (×8): 300 mg via ORAL
  Filled 2023-08-30 (×8): qty 1

## 2023-08-30 MED ORDER — ADULT MULTIVITAMIN W/MINERALS CH
1.0000 | ORAL_TABLET | Freq: Every day | ORAL | Status: DC
  Administered 2023-08-31 – 2023-09-02 (×3): 1 via ORAL
  Filled 2023-08-30 (×3): qty 1

## 2023-08-30 MED ORDER — DICYCLOMINE HCL 10 MG PO CAPS
20.0000 mg | ORAL_CAPSULE | Freq: Two times a day (BID) | ORAL | Status: DC
  Administered 2023-08-31 – 2023-09-02 (×5): 20 mg via ORAL
  Filled 2023-08-30 (×6): qty 2

## 2023-08-30 MED ORDER — FLUTICASONE PROPIONATE 50 MCG/ACT NA SUSP: 2.0000 | Freq: Every day | NASAL | Status: DC | PRN

## 2023-08-30 MED ORDER — BUPRENORPHINE 7.5 MCG/HR TD PTWK: 1.0000 | MEDICATED_PATCH | TRANSDERMAL | Status: DC

## 2023-08-30 MED ORDER — PANTOPRAZOLE SODIUM 40 MG PO TBEC
40.0000 mg | DELAYED_RELEASE_TABLET | Freq: Every day | ORAL | Status: DC
  Administered 2023-08-31 – 2023-09-02 (×3): 40 mg via ORAL
  Filled 2023-08-30 (×3): qty 1

## 2023-08-30 MED ORDER — FAMOTIDINE 20 MG PO TABS
20.0000 mg | ORAL_TABLET | Freq: Every day | ORAL | Status: DC
  Administered 2023-08-31 – 2023-09-02 (×3): 20 mg via ORAL
  Filled 2023-08-30 (×3): qty 1

## 2023-08-30 MED ORDER — BUPROPION HCL ER (XL) 150 MG PO TB24
150.0000 mg | ORAL_TABLET | Freq: Every day | ORAL | Status: DC
  Administered 2023-08-31 – 2023-09-02 (×3): 150 mg via ORAL
  Filled 2023-08-30 (×3): qty 1

## 2023-08-30 MED ORDER — POLYETHYLENE GLYCOL 3350 17 G PO PACK
17.0000 g | PACK | Freq: Three times a day (TID) | ORAL | Status: DC
Start: 2023-08-31 — End: 2023-09-02
  Administered 2023-08-31 – 2023-09-02 (×5): 17 g via ORAL
  Filled 2023-08-30 (×5): qty 1

## 2023-08-30 MED ORDER — MELATONIN 5 MG PO TABS: 10.0000 mg | ORAL_TABLET | Freq: Every evening | ORAL | Status: DC | PRN

## 2023-08-30 MED ORDER — HYDRALAZINE HCL 20 MG/ML IJ SOLN
10.0000 mg | Freq: Four times a day (QID) | INTRAMUSCULAR | Status: DC | PRN
  Administered 2023-09-01 – 2023-09-02 (×2): 10 mg via INTRAVENOUS
  Filled 2023-08-30 (×2): qty 1

## 2023-08-30 MED ORDER — HYDRALAZINE HCL 50 MG PO TABS
25.0000 mg | ORAL_TABLET | Freq: Three times a day (TID) | ORAL | Status: DC
  Administered 2023-08-31 – 2023-09-02 (×8): 25 mg via ORAL
  Filled 2023-08-30 (×8): qty 1

## 2023-08-30 NOTE — H&P (Signed)
 [place]   PATIENT NAME: Cory Campbell    MR#:  [id number]  DATE OF BIRTH:  12-28-1980  DATE OF ADMISSION:  08/30/2023  PRIMARY CARE PHYSICIAN: Lorel Maxie LABOR, MD   Patient is coming from: Home  REQUESTING/REFERRING PHYSICIAN: Arlander Charleston, MD  CHIEF COMPLAINT:   Chief Complaint  Patient presents with   Fatigue    HISTORY OF PRESENT ILLNESS:  Issac J Swavely is a 43 y.o. African-American male with medical history significant for HFpEF, GERD, anxiety, osteoarthritis, asthma, type 2 diabetes mellitus, essential hypertension, dyslipidemia and seizure disorder as well as OSA, who presented to the emergency room with acute onset of generalized fatigue and weakness over the last 4 days.  He admitted to nausea without vomiting or diarrhea.  He has been having significant diminished appetite.  No fever or chills.  No cough or wheezing or dyspnea.  No chest pain or palpitations.  No paresthesias or focal muscle weakness.  ED Course: When the patient came to the ER, BP was 154/100 with otherwise normal vital signs.  Labs revealed mild hyponatremia 134 and albumin of 60 with a creatinine 4.93 compared to 16/1.3 on 08/01/2023.  Lactic acid was 3.7 and later 2.1.  UA came back negative.  CBC was within normal. EKG as reviewed by me : None Imaging: None.  The patient was given 1 L bolus of IV normal saline.  He will be admitted to a medical telemetry bed for further evaluation and management.  PAST MEDICAL HISTORY:   Past Medical History:  Diagnosis Date   (HFpEF) heart failure with preserved ejection fraction (HCC)    Acid reflux    Adopted    [facility] (arthrogryposis multiplex congenita)    wheelchair bound because of this   Anxiety    Arthritis    Arthrogryposis    Asthma    BPH (benign prostatic hyperplasia)    Bronchitis    CHF (congestive heart failure) (HCC)    Chronic pain syndrome    Depressive disorder, not elsewhere classified    Diabetes mellitus without  complication (HCC)    Diverticulitis of colon (without mention of hemorrhage)(562.11)    ED (erectile dysfunction)    Herpes genitalis    HIV infection (HCC)    HTN (hypertension)    Hyperlipidemia    Hypertensive cardiomyopathy (HCC)    Hypogonadism in male    OAB (overactive bladder)    Other psoriasis    Renal disorder    Seizure disorder (HCC)    Sleep apnea    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 or other cause, without mention of thyrotoxic crisis or storm    hyperthyroidism    PAST SURGICAL HISTORY:   Past Surgical History:  Procedure Laterality Date   APPENDECTOMY     COLOSTOMY     CORNEAL TRANSPLANT     DIALYSIS/PERMA CATHETER INSERTION Right 03/10/2022   Procedure: DIALYSIS/PERMA CATHETER INSERTION;  Surgeon: Jama Cordella MATSU, MD;  Location: [facility];  Service: Cardiovascular;  Laterality: Right;   DIALYSIS/PERMA CATHETER REMOVAL N/A 04/18/2022   Procedure: DIALYSIS/PERMA CATHETER REMOVAL;  Surgeon: Jama Cordella MATSU, MD;  Location: [facility];  Service: Cardiovascular;  Laterality: N/A;   feet surgery     both   HAND SURGERY     left and right   leg surgery     left and right   ORTHOPEDIC SURGERY     hands, feet, knees, legs   tubes in ears  both    SOCIAL HISTORY:   Social History   Tobacco Use   Smoking status: Former    Types: Pipe, Software engineer, E-cigarettes    Quit date: 01/2018    Years since quitting: 5.6   Smokeless tobacco: Never   Tobacco comments:    Declines patch  Substance Use Topics   Alcohol use: Not Currently    Alcohol/week: 3.0 standard drinks of alcohol    Types: 3 Cans of beer per week    FAMILY HISTORY:   Family History  Adopted: Yes  Family history unknown: Yes    DRUG ALLERGIES:   Allergies  Allergen Reactions   Penicillins Hives, Itching and Rash    Has tolerated amoxicillin  and ampicillin  (as part of unasyn )     Erythromycin Base Itching and Rash    REVIEW OF SYSTEMS:   ROS As  per history of present illness. All pertinent systems were reviewed above. Constitutional, HEENT, cardiovascular, respiratory, GI, GU, musculoskeletal, neuro, psychiatric, endocrine, integumentary and hematologic systems were reviewed and are otherwise negative/unremarkable except for positive findings mentioned above in the HPI.   MEDICATIONS AT HOME:   Prior to Admission medications   Medication Sig Start Date End Date Taking? Authorizing Provider  acyclovir  (ZOVIRAX ) 400 MG tablet Take 400 mg by mouth 3 (three) times daily.    [provider]  albuterol  (PROVENTIL  HFA;VENTOLIN  HFA) 108 (90 BASE) MCG/ACT inhaler Inhale 2 puffs into the lungs every 4 (four) hours as needed for wheezing or shortness of breath.     [provider]  albuterol  (PROVENTIL ) (2.5 MG/3ML) 0.083% nebulizer solution Take 2.5 mg by nebulization every 4 (four) hours as needed for wheezing or shortness of breath.    [provider]  buprenorphine  (BUTRANS ) 7.5 MCG/HR Place 7.5 mg onto the skin once a week. Tuesday    [provider]  buPROPion  (WELLBUTRIN  XL) 150 MG 24 hr tablet Take 150 mg by mouth daily. 08/11/21   [provider]  CABENUVA 600 & 900 MG/3ML injection INJECT A TOTAL OF 6 ML IN THE MUSCLE ONCE A MONTH FOR 2 MONTHS THEN USE EVERY 2 MONTHS THEREAFTER. 06/07/22   [provider]  cetirizine (ZYRTEC) 10 MG tablet Take 10 mg by mouth daily.    [provider]  chlorthalidone  (HYGROTON ) 25 MG tablet Take 25 mg by mouth daily. 12/04/22   [provider]  Continuous Glucose Receiver (DEXCOM G7 RECEIVER) DEVI Dispense one 12/27/22   [provider]  Continuous Glucose Sensor (DEXCOM G7 SENSOR) MISC Use every 10 days. 11/13/22   [provider]  cyclobenzaprine  (FLEXERIL ) 5 MG tablet Take 5 mg by mouth 3 (three) times daily as needed. 07/11/22   [provider]  diclofenac  sodium (VOLTAREN ) 1 % GEL Apply 2-4 g topically 4 (four)  times daily as needed. For pain.    [provider]  dicyclomine  (BENTYL ) 10 MG capsule Take 20 mg by mouth 2 (two) times daily before a meal. 05/01/21   [provider]  Elastic Bandages & Supports (T.E.D. BELOW KNEE/MEDIUM) MISC 1 each by Does not apply route daily. Measure legs and give appropriate size. 09/12/21   Rizwan, Saima, MD  famotidine  (PEPCID ) 20 MG tablet Take 20 mg by mouth daily.  01/03/16   [provider]  feeding supplement, GLUCERNA SHAKE, (GLUCERNA SHAKE) LIQD Take 237 mLs by mouth 4 (four) times daily.    [provider]  ferrous sulfate  325 (65 FE) MG tablet Take 325 mg by  mouth daily with breakfast.    [provider]  Fluocinolone Acetonide Scalp 0.01 % OIL Apply 1 Application topically 2 (two) times daily as needed.    [provider]  fluticasone  (FLONASE ) 50 MCG/ACT nasal spray Place 2 sprays into both nostrils daily. 07/04/22   Van Knee, MD  fluticasone  (FLOVENT  HFA) 110 MCG/ACT inhaler Inhale 2 puffs into the lungs 2 (two) times daily.    [provider]  gabapentin  (NEURONTIN ) 300 MG capsule Take 1 capsule (300 mg total) by mouth 2 (two) times daily. Patient taking differently: Take 300 mg by mouth 3 (three) times daily. 09/12/21 07/31/23  Rizwan, Saima, MD  glucose 4 GM chewable tablet Chew 1 tablet by mouth as needed for low blood sugar.    [provider]  hydrALAZINE  (APRESOLINE ) 25 MG tablet Take 25 mg by mouth 3 (three) times daily.    [provider]  Insulin  Aspart FlexPen (NOVOLOG ) 100 UNIT/ML Inject 30 Units into the skin 3 (three) times daily before meals. 07/26/23   [provider]  insulin  degludec (TRESIBA ) 200 UNIT/ML FlexTouch Pen Inject 30 Units into the skin daily. 07/26/23   [provider]  isosorbide  dinitrate (ISORDIL ) 10 MG tablet Take 10 mg by mouth 3 (three) times daily.    [provider]  KERENDIA 10 MG TABS Take 10 mg by mouth daily.  07/26/23   [provider]  ketoconazole  (NIZORAL ) 2 % shampoo Apply 1 Application topically 2 (two) times a week. 12/18/22   [provider]  lidocaine  (LIDODERM ) 5 % 2 patches daily.    [provider]  lisinopril  (ZESTRIL ) 40 MG tablet Take 1 tablet by mouth daily. 06/18/23   [provider]  Melatonin 5 MG TABS Take 10 mg by mouth at bedtime as needed (sleep).    [provider]  metFORMIN  (GLUCOPHAGE -XR) 500 MG 24 hr tablet Take 1,000 mg by mouth 2 (two) times daily. 05/18/23   [provider]  metoprolol  succinate (TOPROL -XL) 100 MG 24 hr tablet TAKE 1 TABLET(100 MG) BY MOUTH TWICE DAILY WITH OR IMMEDIATELY FOLLOWING A MEAL 04/30/23   Gollan, Timothy J, MD  Multiple Vitamin (MULTIVITAMIN) tablet Take 1 tablet by mouth daily.    [provider]  nitroGLYCERIN  (NITROSTAT ) 0.4 MG SL tablet PLACE 1 TABLET UNDER THE TONGUE EVERY 5 MINUTES AS NEEDED FOR CHEST PAIN. CALL 911 IF YOU HAVE TAKEN 3 TABLETS FOR CHEST PAIN 08/03/22   Gollan, Timothy J, MD  nortriptyline  (PAMELOR ) 25 MG capsule Take 50 mg by mouth at bedtime.    [provider]  omega-3 acid ethyl esters (LOVAZA ) 1 g capsule Take 1 g by mouth 3 (three) times daily.    [provider]  omeprazole (PRILOSEC) 20 MG capsule Take 20 mg by mouth daily. 09/08/21   [provider]  ondansetron  (ZOFRAN -ODT) 4 MG disintegrating tablet Take 4 mg by mouth every 8 (eight) hours as needed for nausea or vomiting.    [provider]  polyethylene glycol (MIRALAX  / GLYCOLAX ) 17 g packet Take 17 g by mouth 3 (three) times daily.    [provider]  rosuvastatin  (CRESTOR ) 40 MG tablet TAKE 1 TABLET(40 MG) BY MOUTH DAILY 04/30/19   Gollan, Timothy J, MD  solifenacin (VESICARE) 10 MG tablet Take 10 mg by mouth daily.    [provider]  Spacer/Aero-Holding Chambers (AEROCHAMBER PLUS) inhaler Use as instructed 03/23/18   Van Knee, MD      VITAL  SIGNS:  Blood pressure (!) 160/110, pulse 74, temperature 98 F (36.7 C), temperature source Oral, resp. rate 12, height 5' 6 (1.676 m), weight 63 kg, SpO2 95%.  PHYSICAL EXAMINATION:  Physical Exam  GENERAL:  43 y.o.-year-old African-American male patient lying in the bed with no acute distress.  EYES: Pupils equal, round, reactive to light and accommodation. No scleral icterus. Extraocular muscles intact.  HEENT: Head atraumatic, normocephalic. Oropharynx and nasopharynx clear.  NECK:  Supple, no jugular venous distention. No thyroid enlargement, no tenderness.  LUNGS: Normal breath sounds bilaterally, no wheezing, rales,rhonchi or crepitation. No use of accessory muscles of respiration.  CARDIOVASCULAR: Regular rate and rhythm, S1, S2 normal. No murmurs, rubs, or gallops.  ABDOMEN: Soft, nondistended, nontender. Bowel sounds present. No organomegaly or mass.  EXTREMITIES: No pedal edema, cyanosis, or clubbing.  NEUROLOGIC: Cranial nerves II through XII are intact. Muscle strength 5/5 in all extremities. Sensation intact. Gait not checked.  PSYCHIATRIC: The patient is alert and oriented x 3.  Normal affect and good eye contact. SKIN: No obvious rash, lesion, or ulcer.   LABORATORY PANEL:   CBC Recent Labs  Lab 08/30/23 1731  WBC 6.4  HGB 13.0  HCT 41.2  PLT 287   ------------------------------------------------------------------------------------------------------------------  Chemistries  Recent Labs  Lab 08/30/23 1731  NA 134*  K 4.3  CL 99  CO2 20*  GLUCOSE 105*  BUN 60*  CREATININE 4.93*  CALCIUM  10.9*   ------------------------------------------------------------------------------------------------------------------  Cardiac Enzymes No results for input(s): TROPONINI in the last 168 hours. ------------------------------------------------------------------------------------------------------------------  RADIOLOGY:  No results found.    IMPRESSION AND  PLAN:  Assessment and Plan: * Acute kidney injury superimposed on chronic kidney disease (HCC) - The patient be admitted to a medical telemetry bed. - Will continue hydration with IV normal saline. - Will follow BMPs. - Will avoid nephrotoxins.  His lisinopril , Kerendia, metformin  and chlorthalidone  will be held off. - Will obtain bilateral renal ultrasound. - Nephrology consult will be obtained. - I notified Dr. Marcelino about the patient.  Type 2 diabetes mellitus with chronic kidney disease, with long-term current use of insulin  (HCC) - The patient will be placed on supplemental coverage with NovoLog . - Will continue basal coverage. - Will hold off metformin  given his AKI. - Will continue Neurontin  for diabetic neuropathy  Essential hypertension - We will continue antihypertensive therapy.  Asthma, chronic Will continue his inhalers.  Depression - Will continue Wellbutrin  XL  GERD without esophagitis - Will continue PPI therapy.  Dyslipidemia - Will continue statin therapy.   DVT prophylaxis: Lovenox .  Advanced Care Planning:  Code Status: full code.  Family Communication:  The plan of care was discussed in details with the patient (and family). I answered all questions. The patient agreed to proceed with the above mentioned plan. Further management will depend upon hospital course. Disposition Plan: Back to previous home environment Consults called: none.  All the records are reviewed and case discussed with ED provider.  Status is: Inpatient   At the time of the admission, it appears that the appropriate admission status for this patient is inpatient.  This is judged to be reasonable and necessary in order to provide the required intensity of service to ensure the patient's safety given the presenting symptoms, physical exam findings and initial radiographic and laboratory data in the context of comorbid conditions.  The patient requires inpatient status due to high  intensity of service, high risk of further deterioration and high frequency of surveillance required.  I certify that at  the time of admission, it is my clinical judgment that the patient will require inpatient hospital care extending more than 2 midnights.                            Dispo: The patient is from: Home              Anticipated d/c is to: Home              Patient currently is not medically stable to d/c.              Difficult to place patient: No  Madison DELENA Peaches M.D on 08/31/2023 at 2:39 AM  [facility]   From 7 PM-7 AM, contact night-coverage [email or web address]  CC: Primary care physician; Lorel Maxie DELENA, MD

## 2023-08-30 NOTE — ED Provider Notes (Signed)
 [facility] Provider Note    Event Date/Time   First MD Initiated Contact with Patient 08/30/23 1933     (approximate)   History   Fatigue   HPI  Cory Campbell is a 43 y.o. male with extensive past medical history including CHF, chronic pain syndrome, diabetes, HIV who presents with complaints of fatigue and weakness.  He denies fevers or chills     Physical Exam   Triage Vital Signs: ED Triage Vitals  Encounter Vitals Group     BP 08/30/23 1730 (!) 154/100     Girls Systolic BP Percentile --      Girls Diastolic BP Percentile --      Boys Systolic BP Percentile --      Boys Diastolic BP Percentile --      Pulse Rate 08/30/23 1730 81     Resp 08/30/23 1730 18     Temp 08/30/23 1730 98.1 F (36.7 C)     Temp Source 08/30/23 1730 Oral     SpO2 08/30/23 1730 100 %     Weight 08/30/23 1728 63 kg (138 lb 14.2 oz)     Height 08/30/23 1728 1.676 m (5' 6)     Head Circumference --      Peak Flow --      Pain Score 08/30/23 1727 10     Pain Loc --      Pain Education --      Exclude from Growth Chart --     Most recent vital signs: Vitals:   08/30/23 1730  BP: (!) 154/100  Pulse: 81  Resp: 18  Temp: 98.1 F (36.7 C)  SpO2: 100%     General: Awake, no distress.  CV:  Good peripheral perfusion.  Resp:  Normal effort.  Abd:  No distention.  Other:     ED Results / Procedures / Treatments   Labs (all labs ordered are listed, but only abnormal results are displayed) Labs Reviewed  BASIC METABOLIC PANEL WITH GFR - Abnormal; Notable for the following components:      Result Value   Sodium 134 (*)    CO2 20 (*)    Glucose, Bld 105 (*)    BUN 60 (*)    Creatinine, Ser 4.93 (*)    Calcium  10.9 (*)    GFR, Estimated 14 (*)    All other components within normal limits  URINALYSIS, ROUTINE W REFLEX MICROSCOPIC - Abnormal; Notable for the following components:   Color, Urine STRAW (*)    APPearance CLEAR (*)    Hgb urine  dipstick SMALL (*)    Protein, ur 30 (*)    All other components within normal limits  LACTIC ACID, PLASMA - Abnormal; Notable for the following components:   Lactic Acid, Venous 3.7 (*)    All other components within normal limits  CBC  LACTIC ACID, PLASMA     EKG     RADIOLOGY     PROCEDURES:  Critical Care performed:   Procedures   MEDICATIONS ORDERED IN ED: Medications  0.9 %  sodium chloride  infusion ( Intravenous New Bag/Given 08/30/23 2028)     IMPRESSION / MDM / ASSESSMENT AND PLAN / ED COURSE  I reviewed the triage vital signs and the nursing notes. Patient's presentation is most consistent with severe exacerbation of chronic illness.  Patient presents with generalized weakness, lab work is notable for acute kidney injury with creatinine of 5, this is likely the cause of  his symptoms.  No reported change in medication although he had been taking antibiotics for possible prostate abscess.  He is afebrile here, no dysuria, urinalysis is unremarkable  I discussed with hospitalist for admission        FINAL CLINICAL IMPRESSION(S) / ED DIAGNOSES   Final diagnoses:  None     Rx / DC Orders   ED Discharge Orders     None        Note:  This document was prepared using Dragon voice recognition software and may include unintentional dictation errors.   Arlander Charleston, MD 08/30/23 2124

## 2023-08-30 NOTE — ED Notes (Signed)
 First nurse note: Pt here via [facility] from home with c/o of 4 days of weakness. Pt has HX of diabetes.   CBG 81  156/83 HR: 89 99% RA

## 2023-08-30 NOTE — Progress Notes (Signed)
-------------------------------------------------------------------------------   Attestation signed by Canda Rollene HERO, MD at 08/30/23 [phone] I saw and evaluated the patient, participating in the key portions of the service. I reviewed the resident's note. I agree with the resident's findings and plan. 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 Informational material given. - Goal is control rather than cure. - continue ketoconazole  (NIZORAL ) 2 % shampoo; Apply to the scalp as a shampoo 2-3 times weekly. Allow to sit on the scaly for 5-10 minutes before rinsing out - continue ketoconazole  (NIZORAL ) 2 % cream; Apply daily to scaly areas on the face - continue fluocinolone (DERMA-SMOOTHE/FS SCALP OIL) 0.01 % external oil; Apply twice per day as needed for scaling and itching to scalp  The patient was advised to call for an appointment should any new, changing, or symptomatic lesions develop.   RTC: Return if symptoms worsen or fail to improve. or sooner as needed  _________________________________________________________________   Chief Complaint   Chief Complaint  Patient presents with  . Follow-up    Follow up on [facility]. Tx: ketoconazole  shampoo and cream, derma smooth oil. Pt states that symptom improved when using and that he hasn't used medications in the past month.    HPI   Cory Campbell is a 43 y.o. male who presents as a returning patient (last seen 04/19/2023) for follow up of seborrheic dermatitis. Patient doing ok. Using topicals twice per week. Noticed improvement.  The patient denies any other new or changing lesions or areas of concern.   Pertinent Past Medical History    Problem List   None  Past Medical History, Family History, Social History, Medication List, Allergies, and Problem List were reviewed in the rooming section of Epic.   ROS: Other than symptoms mentioned in the HPI, no fevers, chills, or other skin complaints  Physical Examination   GENERAL: Well-appearing male in no acute distress, resting comfortably. NEURO: Alert and oriented, answers questions appropriately PSYCH: Normal mood and affect EYES: lids clear, conjunctiva pink, no scleral icterus or other color change RESP: No increased work of breathing SKIN (Focal Skin Exam): Per patient request, examination of face and scalp was performed  Scale and slight erythema of scalp and nasolabial folds  All areas not commented on are within normal limits or unremarkable  (Approved Template 10/20/2019)

## 2023-08-30 NOTE — ED Triage Notes (Addendum)
 Pt brought in via ems from home.  Pt reports feeling tired for 3 days.  Pt reports some nausea.   No abd pain.  No chest pain or sob.  Pt alert

## 2023-08-30 NOTE — ED Notes (Signed)
 Pt presents to the ED due to increase weakness and fatigue Pt states that his energy has been lower than normal but he has been eating and drinking like normal.

## 2023-08-31 ENCOUNTER — Inpatient Hospital Stay

## 2023-08-31 DIAGNOSIS — F32A Depression, unspecified: Secondary | ICD-10-CM | POA: Insufficient documentation

## 2023-08-31 DIAGNOSIS — N189 Chronic kidney disease, unspecified: Secondary | ICD-10-CM | POA: Diagnosis not present

## 2023-08-31 DIAGNOSIS — N179 Acute kidney failure, unspecified: Secondary | ICD-10-CM | POA: Diagnosis not present

## 2023-08-31 DIAGNOSIS — E1122 Type 2 diabetes mellitus with diabetic chronic kidney disease: Secondary | ICD-10-CM

## 2023-08-31 DIAGNOSIS — E785 Hyperlipidemia, unspecified: Secondary | ICD-10-CM | POA: Insufficient documentation

## 2023-08-31 DIAGNOSIS — J45909 Unspecified asthma, uncomplicated: Secondary | ICD-10-CM | POA: Insufficient documentation

## 2023-08-31 DIAGNOSIS — K219 Gastro-esophageal reflux disease without esophagitis: Secondary | ICD-10-CM | POA: Insufficient documentation

## 2023-08-31 LAB — GLUCOSE, CAPILLARY
Glucose-Capillary: 151 mg/dL — ABNORMAL HIGH (ref 70–99)
Glucose-Capillary: 153 mg/dL — ABNORMAL HIGH (ref 70–99)
Glucose-Capillary: 167 mg/dL — ABNORMAL HIGH (ref 70–99)
Glucose-Capillary: 213 mg/dL — ABNORMAL HIGH (ref 70–99)
Glucose-Capillary: 238 mg/dL — ABNORMAL HIGH (ref 70–99)
Glucose-Capillary: 253 mg/dL — ABNORMAL HIGH (ref 70–99)
Glucose-Capillary: 72 mg/dL (ref 70–99)

## 2023-08-31 LAB — BASIC METABOLIC PANEL WITH GFR
Anion gap: 11 (ref 5–15)
BUN: 61 mg/dL — ABNORMAL HIGH (ref 6–20)
CO2: 21 mmol/L — ABNORMAL LOW (ref 22–32)
Calcium: 9.8 mg/dL (ref 8.9–10.3)
Chloride: 105 mmol/L (ref 98–111)
Creatinine, Ser: 5.02 mg/dL — ABNORMAL HIGH (ref 0.61–1.24)
GFR, Estimated: 14 mL/min — ABNORMAL LOW (ref >60)
Glucose, Bld: 284 mg/dL — ABNORMAL HIGH (ref 70–99)
Potassium: 4.1 mmol/L (ref 3.5–5.1)
Sodium: 137 mmol/L (ref 135–145)

## 2023-08-31 LAB — CBC
HCT: 36.9 % — ABNORMAL LOW (ref 39.0–52.0)
Hemoglobin: 12.1 g/dL — ABNORMAL LOW (ref 13.0–17.0)
MCH: 26.8 pg (ref 26.0–34.0)
MCHC: 32.8 g/dL (ref 30.0–36.0)
MCV: 81.6 fL (ref 80.0–100.0)
Platelets: 253 K/uL (ref 150–400)
RBC: 4.52 MIL/uL (ref 4.22–5.81)
RDW: 15 % (ref 11.5–15.5)
WBC: 5.5 K/uL (ref 4.0–10.5)
nRBC: 0 % (ref 0.0–0.2)

## 2023-08-31 LAB — HEMOGLOBIN A1C
Hgb A1c MFr Bld: 9.8 % — ABNORMAL HIGH (ref 4.8–5.6)
Mean Plasma Glucose: 234.56 mg/dL

## 2023-08-31 LAB — PROTEIN / CREATININE RATIO, URINE
Creatinine, Urine: 32 mg/dL
Protein Creatinine Ratio: 1.41 mg/mg{creat} — ABNORMAL HIGH (ref 0.00–0.15)
Total Protein, Urine: 45 mg/dL

## 2023-08-31 MED ORDER — INSULIN ASPART 100 UNIT/ML IJ SOLN
0.0000 [IU] | Freq: Every day | INTRAMUSCULAR | Status: DC
  Administered 2023-08-31 – 2023-09-01 (×2): 3 [IU] via SUBCUTANEOUS
  Filled 2023-08-31 (×2): qty 1

## 2023-08-31 MED ORDER — INSULIN ASPART 100 UNIT/ML IJ SOLN: 0.0000 [IU] | Freq: Every day | INTRAMUSCULAR | Status: DC

## 2023-08-31 MED ORDER — BUPRENORPHINE 7.5 MCG/HR TD PTWK: 1.0000 | MEDICATED_PATCH | TRANSDERMAL | Status: DC

## 2023-08-31 MED ORDER — INSULIN ASPART 100 UNIT/ML IJ SOLN
0.0000 [IU] | Freq: Three times a day (TID) | INTRAMUSCULAR | Status: DC
  Administered 2023-08-31: 2 [IU] via SUBCUTANEOUS
  Administered 2023-08-31 – 2023-09-01 (×3): 3 [IU] via SUBCUTANEOUS
  Administered 2023-09-01: 2 [IU] via SUBCUTANEOUS
  Administered 2023-09-02: 3 [IU] via SUBCUTANEOUS
  Administered 2023-09-02: 5 [IU] via SUBCUTANEOUS
  Filled 2023-08-31 (×7): qty 1

## 2023-08-31 MED ORDER — INSULIN ASPART 100 UNIT/ML IJ SOLN
0.0000 [IU] | Freq: Three times a day (TID) | INTRAMUSCULAR | Status: DC
  Administered 2023-08-31: 3 [IU] via SUBCUTANEOUS
  Filled 2023-08-31: qty 1

## 2023-08-31 MED ORDER — OXYCODONE-ACETAMINOPHEN 5-325 MG PO TABS
1.0000 | ORAL_TABLET | Freq: Four times a day (QID) | ORAL | Status: DC | PRN
  Administered 2023-09-01 – 2023-09-02 (×3): 1 via ORAL
  Filled 2023-08-31 (×3): qty 1

## 2023-08-31 MED ORDER — MORPHINE SULFATE (PF) 2 MG/ML IV SOLN
1.0000 mg | INTRAVENOUS | Status: DC | PRN
  Administered 2023-09-01: 1 mg via INTRAVENOUS
  Filled 2023-08-31: qty 1

## 2023-08-31 MED ORDER — SODIUM BICARBONATE 650 MG PO TABS
650.0000 mg | ORAL_TABLET | Freq: Every day | ORAL | Status: DC
  Administered 2023-08-31 – 2023-09-01 (×2): 650 mg via ORAL
  Filled 2023-08-31 (×2): qty 1

## 2023-08-31 MED ORDER — INSULIN GLARGINE-YFGN 100 UNIT/ML ~~LOC~~ SOLN
15.0000 [IU] | Freq: Every day | SUBCUTANEOUS | Status: DC
  Administered 2023-08-31 – 2023-09-01 (×2): 15 [IU] via SUBCUTANEOUS
  Filled 2023-08-31 (×3): qty 0.15

## 2023-08-31 NOTE — Assessment & Plan Note (Signed)
 Will continue PPI therapy.

## 2023-08-31 NOTE — Assessment & Plan Note (Addendum)
-   The patient be admitted to a medical telemetry bed. - Will continue hydration with IV normal saline. - Will follow BMPs. - Will avoid nephrotoxins.  His lisinopril , Kerendia, metformin  and chlorthalidone  will be held off. - Will obtain bilateral renal ultrasound. - Nephrology consult will be obtained. - I notified Dr. Marcelino about the patient.

## 2023-08-31 NOTE — Assessment & Plan Note (Signed)
 Will continue statin therapy

## 2023-08-31 NOTE — Progress Notes (Signed)
 [facility]  ROUNDING NOTE   Subjective:   Patient well-known to us  from prior admission in February 2024. 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  He has known past medical history of chronic diastolic heart failure, GERD, anxiety, osteoarthritis, asthma, diabetes mellitus type 2, hypertension, hyperlipidemia, seizure disorder, arthrogryposis multiplex congenita. Patient was having generalized fatigue, diminished appetite, and poor p.o. intake. Admitting creatinine was 4.93 and creatinine currently up to 5.02.  Baseline creatinine was 1.3 on 08/01/2023.   Objective:  Vital signs in last 24 hours:  Temp:  [97.9 F (36.6 C)-98.4 F (36.9 C)] 98.4 F (36.9 C) (07/25 1516) Pulse Rate:  [74-92] 92 (07/25 1516) Resp:  [12-18] 17 (07/25 1516) BP: (129-160)/(94-116) 129/94 (07/25 1516) SpO2:  [95 %-100 %] 99 % (07/25 1516) Weight:  [63 kg] 63 kg (07/24 1728)  Weight change:  Filed Weights   08/30/23 1728  Weight: 63 kg    Intake/Output: I/O last 3 completed shifts: In: 970.5 [P.O.:100; I.V.:870.5] Out: 400 [Urine:400]   Intake/Output this shift:  Total I/O In: 240 [P.O.:240] Out: 1125 [Urine:1125]  Physical Exam: General: No acute distress  Head: Normocephalic, atraumatic. Moist oral mucosal membranes  Neck: Supple  Lungs:  Clear to auscultation, normal effort  Heart: S1S2 no rubs  Abdomen:  Soft, nontender, bowel sounds present  Extremities: No peripheral edema.  Neurologic: Awake, alert, following commands  Skin: No acute rash  Access: No hemodialysis access    Basic Metabolic Panel: Recent Labs  Lab 08/30/23 1731 08/31/23 0359  NA 134* 137  K 4.3 4.1  CL 99 105  CO2 20* 21*  GLUCOSE 105* 284*  BUN 60* 61*  CREATININE 4.93* 5.02*  CALCIUM  10.9* 9.8    Liver Function Tests: No results for input(s): AST, ALT, ALKPHOS, BILITOT, PROT,  ALBUMIN in the last 168 hours. No results for input(s): LIPASE, AMYLASE in the last 168 hours. No results for input(s): AMMONIA in the last 168 hours.  CBC: Recent Labs  Lab 08/30/23 1731 08/31/23 0359  WBC 6.4 5.5  HGB 13.0 12.1*  HCT 41.2 36.9*  MCV 83.7 81.6  PLT 287 253    Cardiac Enzymes: No results for input(s): CKTOTAL, CKMB, CKMBINDEX, TROPONINI in the last 168 hours.  BNP: Invalid input(s): POCBNP  CBG: Recent Labs  Lab 08/31/23 0505 08/31/23 0632 08/31/23 0804 08/31/23 1150 08/31/23 1624  GLUCAP 213* 167* 153* 151* 238*    Microbiology: Results for orders placed or performed during the hospital encounter of 07/30/23  Blood Culture (routine x 2)     Status: None   Collection Time: 07/30/23  2:37 PM   Specimen: BLOOD  Result Value Ref Range Status   Specimen Description BLOOD BLOOD LEFT ARM  Final   Special Requests   Final    BOTTLES DRAWN AEROBIC AND ANAEROBIC Blood Culture results may not be optimal due to an inadequate volume of blood received in culture bottles   Culture   Final    NO GROWTH 5 DAYS Performed at [facility], [address], [place], [place] [address]    Report Status 08/04/2023 FINAL  Final  Blood Culture (routine x 2)     Status: None   Collection Time: 07/30/23  2:42 PM   Specimen: BLOOD  Result Value Ref Range Status   Specimen Description BLOOD LEFT ANTECUBITAL  Final   Special Requests   Final    BOTTLES  DRAWN AEROBIC AND ANAEROBIC Blood Culture results may not be optimal due to an inadequate volume of blood received in culture bottles   Culture   Final    NO GROWTH 5 DAYS Performed at [facility], [address] [place], [place] [address]    Report Status 08/04/2023 FINAL  Final  Resp panel by RT-PCR (RSV, Flu A&B, Covid) Anterior Nasal Swab     Status: None   Collection Time: 07/30/23  2:51 PM   Specimen: Anterior Nasal Swab  Result Value Ref Range Status   SARS Coronavirus 2 by  RT PCR NEGATIVE NEGATIVE Final    Comment: (NOTE) SARS-CoV-2 target nucleic acids are NOT DETECTED.  The SARS-CoV-2 RNA is generally detectable in upper respiratory specimens during the acute phase of infection. The lowest concentration of SARS-CoV-2 viral copies this assay can detect is 138 copies/mL. A negative result does not preclude SARS-Cov-2 infection and should not be used as the sole basis for treatment or other patient management decisions. A negative result may occur with  improper specimen collection/handling, submission of specimen other than nasopharyngeal swab, presence of viral mutation(s) within the areas targeted by this assay, and inadequate number of viral copies(<138 copies/mL). A negative result must be combined with clinical observations, patient history, and epidemiological information. The expected result is Negative.  Fact Sheet for Patients:  BloggerCourse.com  Fact Sheet for Healthcare Providers:  SeriousBroker.it  This test is no t yet approved or cleared by the [place]  FDA and  has been authorized for detection and/or diagnosis of SARS-CoV-2 by FDA under an Emergency Use Authorization (EUA). This EUA will remain  in effect (meaning this test can be used) for the duration of the COVID-19 declaration under Section 564(b)(1) of the Act, 21 U.S.C.section 360bbb-3(b)(1), unless the authorization is terminated  or revoked sooner.       Influenza A by PCR NEGATIVE NEGATIVE Final   Influenza B by PCR NEGATIVE NEGATIVE Final    Comment: (NOTE) The Xpert Xpress SARS-CoV-2/FLU/RSV plus assay is intended as an aid in the diagnosis of influenza from Nasopharyngeal swab specimens and should not be used as a sole basis for treatment. Nasal washings and aspirates are unacceptable for Xpert Xpress SARS-CoV-2/FLU/RSV testing.  Fact Sheet for Patients: BloggerCourse.com  Fact Sheet  for Healthcare Providers: SeriousBroker.it  This test is not yet approved or cleared by the [place]  FDA and has been authorized for detection and/or diagnosis of SARS-CoV-2 by FDA under an Emergency Use Authorization (EUA). This EUA will remain in effect (meaning this test can be used) for the duration of the COVID-19 declaration under Section 564(b)(1) of the Act, 21 U.S.C. section 360bbb-3(b)(1), unless the authorization is terminated or revoked.     Resp Syncytial Virus by PCR NEGATIVE NEGATIVE Final    Comment: (NOTE) Fact Sheet for Patients: BloggerCourse.com  Fact Sheet for Healthcare Providers: SeriousBroker.it  This test is not yet approved or cleared by the [place]  FDA and has been authorized for detection and/or diagnosis of SARS-CoV-2 by FDA under an Emergency Use Authorization (EUA). This EUA will remain in effect (meaning this test can be used) for the duration of the COVID-19 declaration under Section 564(b)(1) of the Act, 21 U.S.C. section 360bbb-3(b)(1), unless the authorization is terminated or revoked.  Performed at [facility], [address] [place], [place] [address]   Urine Culture     Status: Abnormal   Collection Time: 07/30/23  5:28 PM   Specimen: Urine, Random  Result  Value Ref Range Status   Specimen Description   Final    URINE, RANDOM Performed at [facility], [address], [place], [place] [address]    Special Requests   Final    NONE Reflexed from [id number] Performed at [facility], [address] [place], [place] [address]    Culture (A)  Final    <10,000 COLONIES/mL INSIGNIFICANT GROWTH Performed at [facility], [address] [place], [place] [address]    Report Status 07/31/2023 FINAL  Final    Coagulation Studies: No results for input(s): LABPROT, INR in the last 72 hours.  Urinalysis: Recent Labs     08/30/23 1930  COLORURINE STRAW*  LABSPEC 1.009  PHURINE 6.0  GLUCOSEU NEGATIVE  HGBUR SMALL*  BILIRUBINUR NEGATIVE  KETONESUR NEGATIVE  PROTEINUR 30*  NITRITE NEGATIVE  LEUKOCYTESUR NEGATIVE      Imaging: US  RENAL Result Date: 08/31/2023 CLINICAL DATA:  Acute kidney injury. EXAM: RENAL / URINARY TRACT ULTRASOUND COMPLETE COMPARISON:  March 02, 2022. FINDINGS: Right Kidney: Renal measurements: 11.5 x 6.7 x 7.0 cm = volume: 281 mL. Increased echogenicity of renal parenchyma is noted suggesting medical renal disease. No mass or hydronephrosis visualized. Left Kidney: Renal measurements: 9.9 x 6.0 x 4.9 cm = volume: 153 mL. Increased echogenicity of renal parenchyma is noted suggesting medical renal disease. No mass or hydronephrosis visualized. Bladder: Appears normal for degree of bladder distention. Other: None. IMPRESSION: Increased echogenicity of renal parenchyma is noted bilaterally suggesting medical renal disease. No hydronephrosis or renal obstruction is noted. Electronically Signed   By: Lynwood Landy Raddle M.D.   On: 08/31/2023 10:07     Medications:    sodium chloride  100 mL/hr at 08/31/23 1007    budesonide  (PULMICORT ) nebulizer solution  0.25 mg Nebulization BID   [START ON 09/04/2023] buprenorphine   1 patch Transdermal Weekly   buPROPion   150 mg Oral Daily   dicyclomine   20 mg Oral BID AC   enoxaparin  (LOVENOX ) injection  30 mg Subcutaneous Q24H   famotidine   20 mg Oral Daily   feeding supplement (GLUCERNA SHAKE)  237 mL Oral QID   ferrous sulfate   325 mg Oral Q breakfast   fesoterodine   4 mg Oral Daily   gabapentin   300 mg Oral TID   hydrALAZINE   25 mg Oral TID   insulin  aspart  0-5 Units Subcutaneous QHS   insulin  aspart  0-9 Units Subcutaneous TID WC   insulin  glargine-yfgn  15 Units Subcutaneous QHS   isosorbide  dinitrate  10 mg Oral TID   loratadine   10 mg Oral Daily   metoprolol  succinate  100 mg Oral BID WC   multivitamin with minerals  1 tablet Oral  Daily   pantoprazole   40 mg Oral Daily   polyethylene glycol  17 g Oral TID   rosuvastatin   40 mg Oral Daily   sodium bicarbonate   650 mg Oral Daily   acetaminophen  **OR** acetaminophen , albuterol , cyclobenzaprine , diclofenac  Sodium, fluticasone , hydrALAZINE , labetalol , magnesium  hydroxide, melatonin, morphine  injection, nitroGLYCERIN , ondansetron  **OR** ondansetron  (ZOFRAN ) IV, oxyCODONE -acetaminophen , traZODone   Assessment/ Plan:  43 y.o. 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  He has additional past medical history of chronic diastolic heart failure, GERD, anxiety, osteoarthritis, asthma, diabetes mellitus type 2, hypertension, hyperlipidemia, seizure disorder, arthrogryposis multiplex congenita.  1.  Acute kidney injury/chronic kidney disease stage II baseline creatinine 1.3 with prior history of allergic interstitial nephritis/FSGS/early diabetic glomerulosclerosis/proteinuria.  Prior  renal biopsy performed 03/14/2022.  Patient well-known to us  from prior admission.  He had renal biopsy at that time.  It was felt that he had allergic interstitial nephritis.  He presents now with BUN of 60, creatinine of 4.93.  He reports diminished p.o. intake now.  Urinalysis now shows 6-10 RBCs per high-power field urine protein of 30 mg/dL.  Renal ultrasound negative for hydronephrosis.  Proceed with serologic workup now with SPEP, UPEP, ANA, ANCA antibodies, GBM antibodies.  No immediate need to rebiopsy at the moment.  Continue IV fluid hydration for now.  No immediate need for dialysis but may need to be considered if renal function deteriorates.    LOS: 1 Mechel Schutter 7/25/20255:12 PM

## 2023-08-31 NOTE — Plan of Care (Signed)
   Problem: Education: Goal: Knowledge of General Education information will improve Description Including pain rating scale, medication(s)/side effects and non-pharmacologic comfort measures Outcome: Progressing

## 2023-08-31 NOTE — Plan of Care (Signed)
  Problem: Clinical Measurements: Goal: Ability to maintain clinical measurements within normal limits will improve Outcome: Progressing Goal: Will remain free from infection Outcome: Progressing   Problem: Nutrition: Goal: Adequate nutrition will be maintained Outcome: Progressing   Problem: Coping: Goal: Level of anxiety will decrease Outcome: Progressing   Problem: Elimination: Goal: Will not experience complications related to urinary retention Outcome: Progressing   Problem: Pain Managment: Goal: General experience of comfort will improve and/or be controlled Outcome: Progressing   Problem: Safety: Goal: Ability to remain free from injury will improve Outcome: Progressing   Problem: Skin Integrity: Goal: Risk for impaired skin integrity will decrease Outcome: Progressing

## 2023-08-31 NOTE — Assessment & Plan Note (Addendum)
-   The patient will be placed on supplemental coverage with NovoLog . - Will continue basal coverage. - Will hold off metformin  given his AKI. - Will continue Neurontin  for diabetic neuropathy

## 2023-08-31 NOTE — Inpatient Diabetes Management (Signed)
 Inpatient Diabetes Program Recommendations  AACE/ADA: New Consensus Statement on Inpatient Glycemic Control   Target Ranges:  Prepandial:   less than 140 mg/dL      Peak postprandial:   less than 180 mg/dL (1-2 hours)      Critically ill patients:  140 - 180 mg/dL    Latest Reference Range & Units 08/30/23 23:17 08/30/23 23:50 08/31/23 00:19 08/31/23 05:05 08/31/23 06:32 08/31/23 08:04  Glucose-Capillary 70 - 99 mg/dL 52 (L) 57 (L) 72 786 (H) 167 (H) 153 (H)   Review of Glycemic Control  Diabetes history: DM2 Outpatient Diabetes medications: Tresiba  30 units daily, Novolog  30 units TID with meals, Metformin  XR 1000 mg BID Current orders for Inpatient glycemic control: Semglee  30 units daily, Novolog  0-15 units TID with meals, Novolog  0-5 units QHS  Inpatient Diabetes Program Recommendations:    Insulin : Initial glucose 52 mg/dl on 2/75/74 at 76:82.  Please consider changing Semglee  to 15 units at bedtime and Novolog  correction to 0-9 units TID with meals.  Outpatient DM: Due to AKI, would recommend to discontinue Metformin  outpatient.  Thanks, Earnie Gainer, RN, MSN, CDCES Diabetes Coordinator Inpatient Diabetes Program [phone] (Team Pager from 8am to 5pm)

## 2023-08-31 NOTE — Progress Notes (Signed)
 PROGRESS NOTE    Cory Campbell  FMW:[id number] DOB: 1981/01/16 DOA: 08/30/2023 PCP: Lorel Maxie LABOR, MD   Assessment & Plan:   Principal Problem:   Acute kidney injury superimposed on chronic kidney disease (HCC) Active Problems:   Type 2 diabetes mellitus with chronic kidney disease, with long-term current use of insulin  (HCC)   Essential hypertension   Dyslipidemia   GERD without esophagitis   Depression   Asthma, chronic  Assessment and Plan: AKI on CKDIV: unknown baseline Cr/GFR. Cr is trending up from day prior. Continue on IVFs. Nephro consulted   DM2: poorly controlled, HbA1c 8.1. Continue on glargine, SSI w/ accuchecks. Unlikely to restart metformin  secondary to progressive CKD   Metabolic acidosis: likely secondary to AKI on CKD. Will start bicarb   Peripheral neuropathy: continue on home dose of gabapentin     HTN: continue on home dose of metoprolol , isordil    Asthma: unknown stage and/or severity. W/o acute exacerbation. Bronchodilators prn    Depression: severity unknown. Continue on home dose of wellbutrin     GERD: continue on PPI   HLD: continue on statin       DVT prophylaxis: lovenox  Code Status: full  Family Communication: discussed pt's care w/ pt's wife, [name], and answered her questions  Disposition Plan: likely d/c back home  Level of care: Telemetry Medical  Status is: Inpatient Remains inpatient appropriate because: severity of illness    Consultants:  Nephro   Procedures:   Antimicrobials:    Subjective: Pt c/o malaise   Objective: Vitals:   08/30/23 2227 08/31/23 0415 08/31/23 0731 08/31/23 0820  BP: (!) 160/110 (!) 129/98  (!) 151/116  Pulse: 74 86  75  Resp: 12 14  17   Temp:    97.9 F (36.6 C)  TempSrc:    Oral  SpO2:  100% 98% 99%  Weight:      Height:        Intake/Output Summary (Last 24 hours) at 08/31/2023 0927 Last data filed at 08/31/2023 0730 Gross per 24 hour  Intake 970.48 ml  Output 950 ml   Net 20.48 ml   Filed Weights   08/30/23 1728  Weight: 63 kg    Examination:  General exam: Appears calm and comfortable  Respiratory system: Clear to auscultation. Respiratory effort normal. Cardiovascular system: S1 & S2+. No rubs, gallops or clicks.  Gastrointestinal system: Abdomen is nondistended, soft and nontender.  Normal bowel sounds heard. Central nervous system: Alert and oriented.  Psychiatry: Judgement and insight appear normal. Flat mood and affect    Data Reviewed: I have personally reviewed following labs and imaging studies  CBC: Recent Labs  Lab 08/30/23 1731 08/31/23 0359  WBC 6.4 5.5  HGB 13.0 12.1*  HCT 41.2 36.9*  MCV 83.7 81.6  PLT 287 253   Basic Metabolic Panel: Recent Labs  Lab 08/30/23 1731 08/31/23 0359  NA 134* 137  K 4.3 4.1  CL 99 105  CO2 20* 21*  GLUCOSE 105* 284*  BUN 60* 61*  CREATININE 4.93* 5.02*  CALCIUM  10.9* 9.8   GFR: Estimated Creatinine Clearance: 16.9 mL/min (A) (by C-G formula based on SCr of 5.02 mg/dL (H)). Liver Function Tests: No results for input(s): AST, ALT, ALKPHOS, BILITOT, PROT, ALBUMIN in the last 168 hours. No results for input(s): LIPASE, AMYLASE in the last 168 hours. No results for input(s): AMMONIA in the last 168 hours. Coagulation Profile: No results for input(s): INR, PROTIME in the last 168 hours. Cardiac Enzymes: No results for  input(s): CKTOTAL, CKMB, CKMBINDEX, TROPONINI in the last 168 hours. BNP (last 3 results) No results for input(s): PROBNP in the last 8760 hours. HbA1C: No results for input(s): HGBA1C in the last 72 hours. CBG: Recent Labs  Lab 08/30/23 2350 08/31/23 0019 08/31/23 0505 08/31/23 0632 08/31/23 0804  GLUCAP 57* 72 213* 167* 153*   Lipid Profile: No results for input(s): CHOL, HDL, LDLCALC, TRIG, CHOLHDL, LDLDIRECT in the last 72 hours. Thyroid Function Tests: No results for input(s): TSH, T4TOTAL, FREET4,  T3FREE, THYROIDAB in the last 72 hours. Anemia Panel: No results for input(s): VITAMINB12, FOLATE, FERRITIN, TIBC, IRON , RETICCTPCT in the last 72 hours. Sepsis Labs: Recent Labs  Lab 08/30/23 1957 08/30/23 2252  LATICACIDVEN 3.7* 2.1*    No results found for this or any previous visit (from the past 240 hours).       Radiology Studies: No results found.      Scheduled Meds:  budesonide  (PULMICORT ) nebulizer solution  0.25 mg Nebulization BID   [START ON 09/04/2023] buprenorphine   1 patch Transdermal Weekly   buPROPion   150 mg Oral Daily   dicyclomine   20 mg Oral BID AC   enoxaparin  (LOVENOX ) injection  30 mg Subcutaneous Q24H   famotidine   20 mg Oral Daily   feeding supplement (GLUCERNA SHAKE)  237 mL Oral QID   ferrous sulfate   325 mg Oral Q breakfast   fesoterodine   4 mg Oral Daily   gabapentin   300 mg Oral TID   hydrALAZINE   25 mg Oral TID   insulin  aspart  0-5 Units Subcutaneous QHS   insulin  aspart  0-9 Units Subcutaneous TID WC   insulin  glargine-yfgn  15 Units Subcutaneous QHS   isosorbide  dinitrate  10 mg Oral TID   loratadine   10 mg Oral Daily   metoprolol  succinate  100 mg Oral BID WC   multivitamin with minerals  1 tablet Oral Daily   pantoprazole   40 mg Oral Daily   polyethylene glycol  17 g Oral TID   rosuvastatin   40 mg Oral Daily   Continuous Infusions:  sodium chloride  150 mL/hr at 08/31/23 0535     LOS: 1 day       Anthony CHRISTELLA Pouch, MD [facility] Pager [phone]  If 7PM-7AM, please contact night-coverage [email or web address] 08/31/2023, 9:27 AM

## 2023-08-31 NOTE — Assessment & Plan Note (Signed)
-   Will continue Wellbutrin XL.. 

## 2023-08-31 NOTE — Assessment & Plan Note (Signed)
-   We will continue antihypertensive therapy.

## 2023-08-31 NOTE — Assessment & Plan Note (Signed)
-   Will continue his inhalers.

## 2023-09-01 DIAGNOSIS — N179 Acute kidney failure, unspecified: Secondary | ICD-10-CM | POA: Diagnosis not present

## 2023-09-01 DIAGNOSIS — N189 Chronic kidney disease, unspecified: Secondary | ICD-10-CM | POA: Diagnosis not present

## 2023-09-01 LAB — BASIC METABOLIC PANEL WITH GFR
Anion gap: 10 (ref 5–15)
BUN: 49 mg/dL — ABNORMAL HIGH (ref 6–20)
CO2: 23 mmol/L (ref 22–32)
Calcium: 9.6 mg/dL (ref 8.9–10.3)
Chloride: 104 mmol/L (ref 98–111)
Creatinine, Ser: 4.44 mg/dL — ABNORMAL HIGH (ref 0.61–1.24)
GFR, Estimated: 16 mL/min — ABNORMAL LOW (ref >60)
Glucose, Bld: 282 mg/dL — ABNORMAL HIGH (ref 70–99)
Potassium: 4 mmol/L (ref 3.5–5.1)
Sodium: 137 mmol/L (ref 135–145)

## 2023-09-01 LAB — CBC
HCT: 36 % — ABNORMAL LOW (ref 39.0–52.0)
Hemoglobin: 11.5 g/dL — ABNORMAL LOW (ref 13.0–17.0)
MCH: 26.5 pg (ref 26.0–34.0)
MCHC: 31.9 g/dL (ref 30.0–36.0)
MCV: 82.9 fL (ref 80.0–100.0)
Platelets: 238 K/uL (ref 150–400)
RBC: 4.34 MIL/uL (ref 4.22–5.81)
RDW: 15.2 % (ref 11.5–15.5)
WBC: 7 K/uL (ref 4.0–10.5)
nRBC: 0 % (ref 0.0–0.2)

## 2023-09-01 LAB — GLUCOSE, CAPILLARY
Glucose-Capillary: 226 mg/dL — ABNORMAL HIGH (ref 70–99)
Glucose-Capillary: 250 mg/dL — ABNORMAL HIGH (ref 70–99)
Glucose-Capillary: 163 mg/dL — ABNORMAL HIGH (ref 70–99)
Glucose-Capillary: 266 mg/dL — ABNORMAL HIGH (ref 70–99)

## 2023-09-01 LAB — HIV ANTIBODY (ROUTINE TESTING W REFLEX): HIV Screen 4th Generation wRfx: REACTIVE — AB

## 2023-09-01 MED ORDER — SODIUM CHLORIDE 0.9 % IV SOLN: INTRAVENOUS | Status: DC

## 2023-09-01 NOTE — Plan of Care (Signed)

## 2023-09-01 NOTE — Progress Notes (Signed)
 [facility]  ROUNDING NOTE   Subjective:   Patient well-known to us  from prior admission in February 2024. 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  He has known past medical history of chronic diastolic heart failure, GERD, anxiety, osteoarthritis, asthma, diabetes mellitus type 2, hypertension, hyperlipidemia, seizure disorder, arthrogryposis multiplex congenita. Patient was having generalized fatigue, diminished appetite, and poor p.o. intake. Admitting creatinine was 4.93 and creatinine currently up to 5.02.  Baseline creatinine was 1.3 on 08/01/2023.  Update Patient seen sitting up in bed Currently eating breakfast Room air No lower extremity edema Denies pain or discomfort   Objective:  Vital signs in last 24 hours:  Temp:  [98.2 F (36.8 C)-98.7 F (37.1 C)] 98.4 F (36.9 C) (07/26 1016) Pulse Rate:  [81-94] 94 (07/26 1016) Resp:  [16-18] 18 (07/26 1016) BP: (129-155)/(94-110) 155/110 (07/26 1016) SpO2:  [97 %-100 %] 100 % (07/26 1016) FiO2 (%):  [21 %] 21 % (07/25 2210)  Weight change:  Filed Weights   08/30/23 1728  Weight: 63 kg    Intake/Output: I/O last 3 completed shifts: In: 1450.5 [P.O.:580; I.V.:870.5] Out: 3150 [Urine:3150]   Intake/Output this shift:  Total I/O In: -  Out: 1850 [Urine:1850]  Physical Exam: General: No acute distress  Head: Normocephalic, atraumatic. Moist oral mucosal membranes  Neck: Supple  Lungs:  Clear to auscultation, normal effort  Heart: S1S2 no rubs  Abdomen:  Soft, nontender, bowel sounds present  Extremities: No peripheral edema.  Neurologic: Awake, alert, following commands  Skin: No acute rash  Access: No hemodialysis access    Basic Metabolic Panel: Recent Labs  Lab 08/30/23 1731 08/31/23 0359 09/01/23 0439  NA 134* 137 137  K 4.3 4.1 4.0  CL 99 105 104  CO2 20* 21* 23  GLUCOSE 105* 284*  282*  BUN 60* 61* 49*  CREATININE 4.93* 5.02* 4.44*  CALCIUM  10.9* 9.8 9.6    Liver Function Tests: No results for input(s): AST, ALT, ALKPHOS, BILITOT, PROT, ALBUMIN in the last 168 hours. No results for input(s): LIPASE, AMYLASE in the last 168 hours. No results for input(s): AMMONIA in the last 168 hours.  CBC: Recent Labs  Lab 08/30/23 1731 08/31/23 0359 09/01/23 0439  WBC 6.4 5.5 7.0  HGB 13.0 12.1* 11.5*  HCT 41.2 36.9* 36.0*  MCV 83.7 81.6 82.9  PLT 287 253 238    Cardiac Enzymes: No results for input(s): CKTOTAL, CKMB, CKMBINDEX, TROPONINI in the last 168 hours.  BNP: Invalid input(s): POCBNP  CBG: Recent Labs  Lab 08/31/23 1150 08/31/23 1624 08/31/23 2143 09/01/23 0812 09/01/23 1138  GLUCAP 151* 238* 253* 226* 250*    Microbiology: Results for orders placed or performed during the hospital encounter of 07/30/23  Blood Culture (routine x 2)     Status: None   Collection Time: 07/30/23  2:37 PM   Specimen: BLOOD  Result Value Ref Range Status   Specimen Description BLOOD BLOOD LEFT ARM  Final   Special Requests   Final    BOTTLES DRAWN AEROBIC AND ANAEROBIC Blood Culture results may not be optimal due to an inadequate volume of blood received in culture bottles   Culture   Final    NO GROWTH 5 DAYS Performed at [facility], [address] [place], [place] [address]    Report Status 08/04/2023 FINAL  Final  Blood Culture (routine x 2)     Status:  None   Collection Time: 07/30/23  2:42 PM   Specimen: BLOOD  Result Value Ref Range Status   Specimen Description BLOOD LEFT ANTECUBITAL  Final   Special Requests   Final    BOTTLES DRAWN AEROBIC AND ANAEROBIC Blood Culture results may not be optimal due to an inadequate volume of blood received in culture bottles   Culture   Final    NO GROWTH 5 DAYS Performed at [facility], [address] [place], [place] [address]    Report Status 08/04/2023 FINAL   Final  Resp panel by RT-PCR (RSV, Flu A&B, Covid) Anterior Nasal Swab     Status: None   Collection Time: 07/30/23  2:51 PM   Specimen: Anterior Nasal Swab  Result Value Ref Range Status   SARS Coronavirus 2 by RT PCR NEGATIVE NEGATIVE Final    Comment: (NOTE) SARS-CoV-2 target nucleic acids are NOT DETECTED.  The SARS-CoV-2 RNA is generally detectable in upper respiratory specimens during the acute phase of infection. The lowest concentration of SARS-CoV-2 viral copies this assay can detect is 138 copies/mL. A negative result does not preclude SARS-Cov-2 infection and should not be used as the sole basis for treatment or other patient management decisions. A negative result may occur with  improper specimen collection/handling, submission of specimen other than nasopharyngeal swab, presence of viral mutation(s) within the areas targeted by this assay, and inadequate number of viral copies(<138 copies/mL). A negative result must be combined with clinical observations, patient history, and epidemiological information. The expected result is Negative.  Fact Sheet for Patients:  BloggerCourse.com  Fact Sheet for Healthcare Providers:  SeriousBroker.it  This test is no t yet approved or cleared by the [place]  FDA and  has been authorized for detection and/or diagnosis of SARS-CoV-2 by FDA under an Emergency Use Authorization (EUA). This EUA will remain  in effect (meaning this test can be used) for the duration of the COVID-19 declaration under Section 564(b)(1) of the Act, 21 U.S.C.section 360bbb-3(b)(1), unless the authorization is terminated  or revoked sooner.       Influenza A by PCR NEGATIVE NEGATIVE Final   Influenza B by PCR NEGATIVE NEGATIVE Final    Comment: (NOTE) The Xpert Xpress SARS-CoV-2/FLU/RSV plus assay is intended as an aid in the diagnosis of influenza from Nasopharyngeal swab specimens and should not be  used as a sole basis for treatment. Nasal washings and aspirates are unacceptable for Xpert Xpress SARS-CoV-2/FLU/RSV testing.  Fact Sheet for Patients: BloggerCourse.com  Fact Sheet for Healthcare Providers: SeriousBroker.it  This test is not yet approved or cleared by the [place]  FDA and has been authorized for detection and/or diagnosis of SARS-CoV-2 by FDA under an Emergency Use Authorization (EUA). This EUA will remain in effect (meaning this test can be used) for the duration of the COVID-19 declaration under Section 564(b)(1) of the Act, 21 U.S.C. section 360bbb-3(b)(1), unless the authorization is terminated or revoked.     Resp Syncytial Virus by PCR NEGATIVE NEGATIVE Final    Comment: (NOTE) Fact Sheet for Patients: BloggerCourse.com  Fact Sheet for Healthcare Providers: SeriousBroker.it  This test is not yet approved or cleared by the [place]  FDA and has been authorized for detection and/or diagnosis of SARS-CoV-2 by FDA under an Emergency Use Authorization (EUA). This EUA will remain in effect (meaning this test can be used) for the duration of the COVID-19 declaration under Section 564(b)(1) of the Act, 21 U.S.C. section 360bbb-3(b)(1), unless the authorization is terminated or  revoked.  Performed at [facility], [address], [place], [place] [address]   Urine Culture     Status: Abnormal   Collection Time: 07/30/23  5:28 PM   Specimen: Urine, Random  Result Value Ref Range Status   Specimen Description   Final    URINE, RANDOM Performed at [facility], [address], [place], [place] [address]    Special Requests   Final    NONE Reflexed from [phone] [id number] Performed at [facility], [address] [place], [place] [address]    Culture (A)  Final    <10,000 COLONIES/mL INSIGNIFICANT GROWTH Performed at [facility], [address] [place], [place] [address]    Report Status 07/31/2023 FINAL  Final    Coagulation Studies: No results for input(s): LABPROT, INR in the last 72 hours.  Urinalysis: Recent Labs    08/30/23 1930  COLORURINE STRAW*  LABSPEC 1.009  PHURINE 6.0  GLUCOSEU NEGATIVE  HGBUR SMALL*  BILIRUBINUR NEGATIVE  KETONESUR NEGATIVE  PROTEINUR 30*  NITRITE NEGATIVE  LEUKOCYTESUR NEGATIVE      Imaging: US  RENAL Result Date: 08/31/2023 CLINICAL DATA:  Acute kidney injury. EXAM: RENAL / URINARY TRACT ULTRASOUND COMPLETE COMPARISON:  March 02, 2022. FINDINGS: Right Kidney: Renal measurements: 11.5 x 6.7 x 7.0 cm = volume: 281 mL. Increased echogenicity of renal parenchyma is noted suggesting medical renal disease. No mass or hydronephrosis visualized. Left Kidney: Renal measurements: 9.9 x 6.0 x 4.9 cm = volume: 153 mL. Increased echogenicity of renal parenchyma is noted suggesting medical renal disease. No mass or hydronephrosis visualized. Bladder: Appears normal for degree of bladder distention. Other: None. IMPRESSION: Increased echogenicity of renal parenchyma is noted bilaterally suggesting medical renal disease. No hydronephrosis or renal obstruction is noted. Electronically Signed   By: Lynwood Landy Raddle M.D.   On: 08/31/2023 10:07     Medications:      budesonide  (PULMICORT ) nebulizer solution  0.25 mg Nebulization BID   [START ON 09/04/2023] buprenorphine   1 patch Transdermal Weekly   buPROPion   150 mg Oral Daily   dicyclomine   20 mg Oral BID AC   enoxaparin  (LOVENOX ) injection  30 mg Subcutaneous Q24H   famotidine   20 mg Oral Daily   feeding supplement (GLUCERNA SHAKE)  237 mL Oral QID   ferrous sulfate   325 mg Oral Q breakfast   fesoterodine   4 mg Oral Daily   gabapentin   300 mg Oral TID   hydrALAZINE   25 mg Oral TID   insulin  aspart  0-5 Units Subcutaneous QHS   insulin  aspart  0-9 Units Subcutaneous TID WC   insulin  glargine-yfgn  15 Units  Subcutaneous QHS   isosorbide  dinitrate  10 mg Oral TID   loratadine   10 mg Oral Daily   metoprolol  succinate  100 mg Oral BID WC   multivitamin with minerals  1 tablet Oral Daily   pantoprazole   40 mg Oral Daily   polyethylene glycol  17 g Oral TID   rosuvastatin   40 mg Oral Daily   sodium bicarbonate   650 mg Oral Daily   acetaminophen  **OR** acetaminophen , albuterol , cyclobenzaprine , diclofenac  Sodium, fluticasone , hydrALAZINE , labetalol , magnesium  hydroxide, melatonin, morphine  injection, nitroGLYCERIN , ondansetron  **OR** ondansetron  (ZOFRAN ) IV, oxyCODONE -acetaminophen , traZODone   Assessment/ Plan:  43 y.o. 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  He has additional past medical history of chronic diastolic heart failure, GERD, anxiety, osteoarthritis, asthma, diabetes mellitus type 2,  hypertension, hyperlipidemia, seizure disorder, arthrogryposis multiplex congenita.  1.  Acute kidney injury/chronic kidney disease stage II baseline creatinine 1.3 with prior history of allergic interstitial nephritis/FSGS/early diabetic glomerulosclerosis/proteinuria.  Prior renal biopsy performed 03/14/2022.  Patient well-known to us  from prior admission.  He had renal biopsy at that time.  It was felt that he had allergic interstitial nephritis.  He presents now with BUN of 60, creatinine of 4.93.  He reports diminished p.o. intake now.  Urinalysis now shows 6-10 RBCs per high-power field urine protein of 30 mg/dL.  Renal ultrasound negative for hydronephrosis.   -Serologic workup pending with SPEP, UPEP, ANA, ANCA antibodies, GBM antibodies.    -Will continue IV hydration as creatinine has responded appropriately.  - No acute indication for dialysis however monitoring closely.  2. Anemia of chronic kidney disease Lab Results  Component Value Date   HGB 11.5 (L) 09/01/2023    Hemoglobin acceptable for renal patient.   Will continue to monitor for now.  3. Diabetes mellitus type II with chronic kidney disease/renal manifestations: insulin  dependent. Home regimen includes Tresiba  and metformin . Most recent hemoglobin A1c is 9.8 on 08/31/23.   4.  Hypertension with chronic kidney disease.  Home regimen includes chlorthalidone , isosorbide , lisinopril , and metoprolol .  Chlorthalidone  and lisinopril  currently held.    LOS: 2 Ryker Pherigo 7/26/202512:50 PM

## 2023-09-01 NOTE — Progress Notes (Signed)
 PROGRESS NOTE    Cory Campbell  FMW:[id number] DOB: 1980-11-16 DOA: 08/30/2023 PCP: Cory Campbell LABOR, MD   Assessment & Plan:   Principal Problem:   Acute kidney injury superimposed on chronic kidney disease (HCC) Active Problems:   Type 2 diabetes mellitus with chronic kidney disease, with long-term current use of insulin  (HCC)   Essential hypertension   Dyslipidemia   GERD without esophagitis   Depression   Asthma, chronic  Assessment and Plan: AKI on CKDII: as per nephro. 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 Cr is trending down from day prior. Continue on IVFs. Serology work up is pending as per nephro. Nephro following and recs apprec   DM2: poorly controlled, HbA1c 8.1. Continue on glargine, SSI w/ accuchecks. Unlikely to restart metformin  secondary to progressive CKD   Metabolic acidosis: resolved  Peripheral neuropathy: continue on home dose of gabapentin     HTN: continue on home dose of metoprolol , isordil     Asthma: unknown stage and/or severity. W/o acute exacerbation. Bronchodilators prn    Depression: severity unknown. Continue on home dose of bupropion     GERD: continue on PPI    HLD: continue on statin       DVT prophylaxis: lovenox  Code Status: full  Family Communication: discussed pt's care w/ pt's wife, Cory Campbell, and answered her questions  Disposition Plan: likely d/c back home  Level of care: Telemetry Medical  Status is: Inpatient Remains inpatient appropriate because: severity of illness    Consultants:  Nephro   Procedures:   Antimicrobials:    Subjective: Pt c/o fatigue    Objective: Vitals:   08/31/23 1941 08/31/23 1952 09/01/23 0347 09/01/23 0733  BP: (!) 131/94  (!) 142/107   Pulse: 86  81   Resp: 17  16   Temp: 98.2 F (36.8 C)  98.7 F (37.1 C)   TempSrc: Oral     SpO2: 99% 97% 100% 97%  Weight:      Height:        Intake/Output Summary (Last 24 hours) at  09/01/2023 0911 Last data filed at 09/01/2023 0723 Gross per 24 hour  Intake 480 ml  Output 3200 ml  Net -2720 ml   Filed Weights   08/30/23 1728  Weight: 63 kg    Examination:  General exam: appears comfortable. Poor dentition  Respiratory system: clear breath sounds b/l  Cardiovascular system: S1/S2+. No rubs or clicks  Gastrointestinal system: Abd is soft, NT, ND & hypoactive bowel sounds  Central nervous system: alert & oriented.  Psychiatry: judgement and insight appears at baseline. Flat mood and affect     Data Reviewed: I have personally reviewed following labs and imaging studies  CBC: Recent Labs  Lab 08/30/23 1731 08/31/23 0359 09/01/23 0439  WBC 6.4 5.5 7.0  HGB 13.0 12.1* 11.5*  HCT 41.2 36.9* 36.0*  MCV 83.7 81.6 82.9  PLT 287 253 238   Basic Metabolic Panel: Recent Labs  Lab 08/30/23 1731 08/31/23 0359 09/01/23 0439  NA 134* 137 137  K 4.3 4.1 4.0  CL 99 105 104  CO2 20* 21* 23  GLUCOSE 105* 284* 282*  BUN 60* 61* 49*  CREATININE 4.93* 5.02* 4.44*  CALCIUM  10.9* 9.8 9.6   GFR: Estimated Creatinine Clearance: 19.1 mL/min (A) (by C-G formula based on SCr of 4.44 mg/dL (H)). Liver Function Tests: No results for input(s): AST, ALT, ALKPHOS, BILITOT, PROT, ALBUMIN in the last 168 hours. No results for input(s): LIPASE, AMYLASE in the  last 168 hours. No results for input(s): AMMONIA in the last 168 hours. Coagulation Profile: No results for input(s): INR, PROTIME in the last 168 hours. Cardiac Enzymes: No results for input(s): CKTOTAL, CKMB, CKMBINDEX, TROPONINI in the last 168 hours. BNP (last 3 results) No results for input(s): PROBNP in the last 8760 hours. HbA1C: Recent Labs    08/31/23 0359  HGBA1C 9.8*   CBG: Recent Labs  Lab 08/31/23 0804 08/31/23 1150 08/31/23 1624 08/31/23 2143 09/01/23 0812  GLUCAP 153* 151* 238* 253* 226*   Lipid Profile: No results for input(s): CHOL, HDL,  LDLCALC, TRIG, CHOLHDL, LDLDIRECT in the last 72 hours. Thyroid Function Tests: No results for input(s): TSH, T4TOTAL, FREET4, T3FREE, THYROIDAB in the last 72 hours. Anemia Panel: No results for input(s): VITAMINB12, FOLATE, FERRITIN, TIBC, IRON , RETICCTPCT in the last 72 hours. Sepsis Labs: Recent Labs  Lab 08/30/23 1957 08/30/23 2252  LATICACIDVEN 3.7* 2.1*    No results found for this or any previous visit (from the past 240 hours).       Radiology Studies: US  RENAL Result Date: 08/31/2023 CLINICAL DATA:  Acute kidney injury. EXAM: RENAL / URINARY TRACT ULTRASOUND COMPLETE COMPARISON:  March 02, 2022. FINDINGS: Right Kidney: Renal measurements: 11.5 x 6.7 x 7.0 cm = volume: 281 mL. Increased echogenicity of renal parenchyma is noted suggesting medical renal disease. No mass or hydronephrosis visualized. Left Kidney: Renal measurements: 9.9 x 6.0 x 4.9 cm = volume: 153 mL. Increased echogenicity of renal parenchyma is noted suggesting medical renal disease. No mass or hydronephrosis visualized. Bladder: Appears normal for degree of bladder distention. Other: None. IMPRESSION: Increased echogenicity of renal parenchyma is noted bilaterally suggesting medical renal disease. No hydronephrosis or renal obstruction is noted. Electronically Signed   By: Lynwood Landy Raddle M.D.   On: 08/31/2023 10:07        Scheduled Meds:  budesonide  (PULMICORT ) nebulizer solution  0.25 mg Nebulization BID   [START ON 09/04/2023] buprenorphine   1 patch Transdermal Weekly   buPROPion   150 mg Oral Daily   dicyclomine   20 mg Oral BID AC   enoxaparin  (LOVENOX ) injection  30 mg Subcutaneous Q24H   famotidine   20 mg Oral Daily   feeding supplement (GLUCERNA SHAKE)  237 mL Oral QID   ferrous sulfate   325 mg Oral Q breakfast   fesoterodine   4 mg Oral Daily   gabapentin   300 mg Oral TID   hydrALAZINE   25 mg Oral TID   insulin  aspart  0-5 Units Subcutaneous QHS   insulin  aspart   0-9 Units Subcutaneous TID WC   insulin  glargine-yfgn  15 Units Subcutaneous QHS   isosorbide  dinitrate  10 mg Oral TID   loratadine   10 mg Oral Daily   metoprolol  succinate  100 mg Oral BID WC   multivitamin with minerals  1 tablet Oral Daily   pantoprazole   40 mg Oral Daily   polyethylene glycol  17 g Oral TID   rosuvastatin   40 mg Oral Daily   sodium bicarbonate   650 mg Oral Daily   Continuous Infusions:     LOS: 2 days       Anthony CHRISTELLA Pouch, MD [facility] Pager [phone]  If 7PM-7AM, please contact night-coverage [email or web address] 09/01/2023, 9:11 AM

## 2023-09-02 DIAGNOSIS — N189 Chronic kidney disease, unspecified: Secondary | ICD-10-CM | POA: Diagnosis not present

## 2023-09-02 DIAGNOSIS — N179 Acute kidney failure, unspecified: Secondary | ICD-10-CM | POA: Diagnosis not present

## 2023-09-02 LAB — BASIC METABOLIC PANEL WITH GFR
Anion gap: 9 (ref 5–15)
BUN: 41 mg/dL — ABNORMAL HIGH (ref 6–20)
CO2: 22 mmol/L (ref 22–32)
Calcium: 8.9 mg/dL (ref 8.9–10.3)
Chloride: 107 mmol/L (ref 98–111)
Creatinine, Ser: 3.62 mg/dL — ABNORMAL HIGH (ref 0.61–1.24)
GFR, Estimated: 20 mL/min — ABNORMAL LOW (ref >60)
Glucose, Bld: 271 mg/dL — ABNORMAL HIGH (ref 70–99)
Potassium: 3.6 mmol/L (ref 3.5–5.1)
Sodium: 138 mmol/L (ref 135–145)

## 2023-09-02 LAB — CBC
HCT: 36.3 % — ABNORMAL LOW (ref 39.0–52.0)
Hemoglobin: 11.6 g/dL — ABNORMAL LOW (ref 13.0–17.0)
MCH: 26.1 pg (ref 26.0–34.0)
MCHC: 32 g/dL (ref 30.0–36.0)
MCV: 81.8 fL (ref 80.0–100.0)
Platelets: 274 K/uL (ref 150–400)
RBC: 4.44 MIL/uL (ref 4.22–5.81)
RDW: 15.2 % (ref 11.5–15.5)
WBC: 6.3 K/uL (ref 4.0–10.5)
nRBC: 0 % (ref 0.0–0.2)

## 2023-09-02 LAB — GLUCOSE, CAPILLARY
Glucose-Capillary: 242 mg/dL — ABNORMAL HIGH (ref 70–99)
Glucose-Capillary: 251 mg/dL — ABNORMAL HIGH (ref 70–99)

## 2023-09-02 MED ORDER — NORTRIPTYLINE HCL 25 MG PO CAPS
50.0000 mg | ORAL_CAPSULE | Freq: Every day | ORAL | Status: DC
  Filled 2023-09-02: qty 2

## 2023-09-02 MED ORDER — ACYCLOVIR 200 MG PO CAPS
400.0000 mg | ORAL_CAPSULE | Freq: Three times a day (TID) | ORAL | Status: DC
  Administered 2023-09-02: 400 mg via ORAL
  Filled 2023-09-02 (×3): qty 2

## 2023-09-02 MED ORDER — HYDRALAZINE HCL 50 MG PO TABS: 50.0000 mg | ORAL_TABLET | Freq: Three times a day (TID) | ORAL | Status: DC

## 2023-09-02 MED ORDER — AMLODIPINE BESYLATE 10 MG PO TABS
10.0000 mg | ORAL_TABLET | Freq: Every day | ORAL | Status: DC
  Administered 2023-09-02: 10 mg via ORAL
  Filled 2023-09-02: qty 1

## 2023-09-02 MED ORDER — CHLORTHALIDONE 25 MG PO TABS: 25.0000 mg | ORAL_TABLET | Freq: Every day | ORAL | Status: DC

## 2023-09-02 MED ORDER — HYDRALAZINE HCL 50 MG PO TABS: 50.0000 mg | ORAL_TABLET | Freq: Three times a day (TID) | ORAL | 0 refills | Status: AC

## 2023-09-02 MED ORDER — HYDRALAZINE HCL 50 MG PO TABS: 50.0000 mg | ORAL_TABLET | Freq: Three times a day (TID) | ORAL | 0 refills | Status: DC

## 2023-09-02 MED ORDER — CLONIDINE HCL 0.2 MG/24HR TD PTWK
0.2000 mg | MEDICATED_PATCH | TRANSDERMAL | Status: DC
  Administered 2023-09-02: 0.2 mg via TRANSDERMAL
  Filled 2023-09-02: qty 1

## 2023-09-02 NOTE — Plan of Care (Signed)

## 2023-09-02 NOTE — Progress Notes (Signed)
 [facility]  ROUNDING NOTE   Subjective:   Patient well-known to us  from prior admission in February 2024. 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  He has known past medical history of chronic diastolic heart failure, GERD, anxiety, osteoarthritis, asthma, diabetes mellitus type 2, hypertension, hyperlipidemia, seizure disorder, arthrogryposis multiplex congenita. Patient was having generalized fatigue, diminished appetite, and poor p.o. intake. Admitting creatinine was 4.93 and creatinine currently up to 5.02.  Baseline creatinine was 1.3 on 08/01/2023.  Update Patient seen sitting up in bed Currently eating breakfast Alert and oriented States he feels well, room air No lower extremity edema  Creatinine 3.62 Urine output 4 L  Objective:  Vital signs in last 24 hours:  Temp:  [98.1 F (36.7 C)-98.2 F (36.8 C)] 98.1 F (36.7 C) (07/27 0723) Pulse Rate:  [81-93] 92 (07/27 1137) Resp:  [17-18] 17 (07/27 0723) BP: (137-160)/(100-119) 150/110 (07/27 1137) SpO2:  [97 %-100 %] 100 % (07/27 1137) FiO2 (%):  [21 %] 21 % (07/26 2356)  Weight change:  Filed Weights   08/30/23 1728  Weight: 63 kg    Intake/Output: I/O last 3 completed shifts: In: 1818.7 [P.O.:480; I.V.:1338.7] Out: 4850 [Urine:4850]   Intake/Output this shift:  Total I/O In: 240 [P.O.:240] Out: 700 [Urine:700]  Physical Exam: General: No acute distress  Head: Normocephalic, atraumatic. Moist oral mucosal membranes  Neck: Supple  Lungs:  Clear to auscultation, normal effort  Heart: S1S2 no rubs  Abdomen:  Soft, nontender, bowel sounds present  Extremities: No peripheral edema.  Neurologic: Awake, alert, following commands  Skin: No acute rash  Access: No hemodialysis access    Basic Metabolic Panel: Recent Labs  Lab 08/30/23 1731 08/31/23 0359 09/01/23 0439 09/02/23 0516  NA 134*  137 137 138  K 4.3 4.1 4.0 3.6  CL 99 105 104 107  CO2 20* 21* 23 22  GLUCOSE 105* 284* 282* 271*  BUN 60* 61* 49* 41*  CREATININE 4.93* 5.02* 4.44* 3.62*  CALCIUM  10.9* 9.8 9.6 8.9    Liver Function Tests: No results for input(s): AST, ALT, ALKPHOS, BILITOT, PROT, ALBUMIN in the last 168 hours. No results for input(s): LIPASE, AMYLASE in the last 168 hours. No results for input(s): AMMONIA in the last 168 hours.  CBC: Recent Labs  Lab 08/30/23 1731 08/31/23 0359 09/01/23 0439 09/02/23 0516  WBC 6.4 5.5 7.0 6.3  HGB 13.0 12.1* 11.5* 11.6*  HCT 41.2 36.9* 36.0* 36.3*  MCV 83.7 81.6 82.9 81.8  PLT 287 253 238 274    Cardiac Enzymes: No results for input(s): CKTOTAL, CKMB, CKMBINDEX, TROPONINI in the last 168 hours.  BNP: Invalid input(s): POCBNP  CBG: Recent Labs  Lab 09/01/23 1138 09/01/23 1712 09/01/23 2147 09/02/23 0758 09/02/23 1201  GLUCAP 250* 163* 266* 242* 251*    Microbiology: Results for orders placed or performed during the hospital encounter of 07/30/23  Blood Culture (routine x 2)     Status: None   Collection Time: 07/30/23  2:37 PM   Specimen: BLOOD  Result Value Ref Range Status   Specimen Description BLOOD BLOOD LEFT ARM  Final   Special Requests   Final    BOTTLES DRAWN AEROBIC AND ANAEROBIC Blood Culture results may not be optimal due to an inadequate volume of blood received in culture bottles   Culture   Final    NO GROWTH 5 DAYS Performed at [facility], [address] [place], [place] [address]    Report Status 08/04/2023 FINAL  Final  Blood Culture (routine x 2)     Status: None   Collection Time: 07/30/23  2:42 PM   Specimen: BLOOD  Result Value Ref Range Status   Specimen Description BLOOD LEFT ANTECUBITAL  Final   Special Requests   Final    BOTTLES DRAWN AEROBIC AND ANAEROBIC Blood Culture results may not be optimal due to an inadequate volume of blood received in culture bottles    Culture   Final    NO GROWTH 5 DAYS Performed at [facility], [address] [place], [place] [address]    Report Status 08/04/2023 FINAL  Final  Resp panel by RT-PCR (RSV, Flu A&B, Covid) Anterior Nasal Swab     Status: None   Collection Time: 07/30/23  2:51 PM   Specimen: Anterior Nasal Swab  Result Value Ref Range Status   SARS Coronavirus 2 by RT PCR NEGATIVE NEGATIVE Final    Comment: (NOTE) SARS-CoV-2 target nucleic acids are NOT DETECTED.  The SARS-CoV-2 RNA is generally detectable in upper respiratory specimens during the acute phase of infection. The lowest concentration of SARS-CoV-2 viral copies this assay can detect is 138 copies/mL. A negative result does not preclude SARS-Cov-2 infection and should not be used as the sole basis for treatment or other patient management decisions. A negative result may occur with  improper specimen collection/handling, submission of specimen other than nasopharyngeal swab, presence of viral mutation(s) within the areas targeted by this assay, and inadequate number of viral copies(<138 copies/mL). A negative result must be combined with clinical observations, patient history, and epidemiological information. The expected result is Negative.  Fact Sheet for Patients:  BloggerCourse.com  Fact Sheet for Healthcare Providers:  SeriousBroker.it  This test is no t yet approved or cleared by the [place]  FDA and  has been authorized for detection and/or diagnosis of SARS-CoV-2 by FDA under an Emergency Use Authorization (EUA). This EUA will remain  in effect (meaning this test can be used) for the duration of the COVID-19 declaration under Section 564(b)(1) of the Act, 21 U.S.C.section 360bbb-3(b)(1), unless the authorization is terminated  or revoked sooner.       Influenza A by PCR NEGATIVE NEGATIVE Final   Influenza B by PCR NEGATIVE NEGATIVE Final    Comment:  (NOTE) The Xpert Xpress SARS-CoV-2/FLU/RSV plus assay is intended as an aid in the diagnosis of influenza from Nasopharyngeal swab specimens and should not be used as a sole basis for treatment. Nasal washings and aspirates are unacceptable for Xpert Xpress SARS-CoV-2/FLU/RSV testing.  Fact Sheet for Patients: BloggerCourse.com  Fact Sheet for Healthcare Providers: SeriousBroker.it  This test is not yet approved or cleared by the [place]  FDA and has been authorized for detection and/or diagnosis of SARS-CoV-2 by FDA under an Emergency Use Authorization (EUA). This EUA will remain in effect (meaning this test can be used) for the duration of the COVID-19 declaration under Section 564(b)(1) of the Act, 21 U.S.C. section 360bbb-3(b)(1), unless the authorization is terminated or revoked.     Resp Syncytial Virus by PCR NEGATIVE NEGATIVE Final    Comment: (NOTE) Fact Sheet for Patients: BloggerCourse.com  Fact Sheet for Healthcare Providers: SeriousBroker.it  This test is not yet approved or cleared by the [place]  FDA and has been authorized for detection and/or diagnosis of SARS-CoV-2 by FDA under an Emergency Use Authorization (EUA). This EUA will remain in effect (meaning this test  can be used) for the duration of the COVID-19 declaration under Section 564(b)(1) of the Act, 21 U.S.C. section 360bbb-3(b)(1), unless the authorization is terminated or revoked.  Performed at [facility], [address], [place], [place] [address]   Urine Culture     Status: Abnormal   Collection Time: 07/30/23  5:28 PM   Specimen: Urine, Random  Result Value Ref Range Status   Specimen Description   Final    URINE, RANDOM Performed at [facility], [address], [place], [place] [address]    Special Requests   Final    NONE Reflexed from [id number] Performed at  [facility], [address] [place], [place] [address]    Culture (A)  Final    <10,000 COLONIES/mL INSIGNIFICANT GROWTH Performed at [facility], [address], [place], [place] [address]    Report Status 07/31/2023 FINAL  Final    Coagulation Studies: No results for input(s): LABPROT, INR in the last 72 hours.  Urinalysis: Recent Labs    08/30/23 1930  COLORURINE STRAW*  LABSPEC 1.009  PHURINE 6.0  GLUCOSEU NEGATIVE  HGBUR SMALL*  BILIRUBINUR NEGATIVE  KETONESUR NEGATIVE  PROTEINUR 30*  NITRITE NEGATIVE  LEUKOCYTESUR NEGATIVE      Imaging: No results found.    Medications:      acyclovir   400 mg Oral TID   amLODipine   10 mg Oral Daily   budesonide  (PULMICORT ) nebulizer solution  0.25 mg Nebulization BID   [START ON 09/04/2023] buprenorphine   1 patch Transdermal Weekly   buPROPion   150 mg Oral Daily   cloNIDine   0.2 mg Transdermal Weekly   dicyclomine   20 mg Oral BID AC   enoxaparin  (LOVENOX ) injection  30 mg Subcutaneous Q24H   famotidine   20 mg Oral Daily   feeding supplement (GLUCERNA SHAKE)  237 mL Oral QID   ferrous sulfate   325 mg Oral Q breakfast   fesoterodine   4 mg Oral Daily   gabapentin   300 mg Oral TID   hydrALAZINE   50 mg Oral TID   insulin  aspart  0-5 Units Subcutaneous QHS   insulin  aspart  0-9 Units Subcutaneous TID WC   insulin  glargine-yfgn  15 Units Subcutaneous QHS   isosorbide  dinitrate  10 mg Oral TID   loratadine   10 mg Oral Daily   metoprolol  succinate  100 mg Oral BID WC   multivitamin with minerals  1 tablet Oral Daily   nortriptyline   50 mg Oral QHS   pantoprazole   40 mg Oral Daily   polyethylene glycol  17 g Oral TID   rosuvastatin   40 mg Oral Daily   acetaminophen  **OR** acetaminophen , albuterol , cyclobenzaprine , diclofenac  Sodium, fluticasone , hydrALAZINE , labetalol , magnesium  hydroxide, melatonin, morphine  injection, nitroGLYCERIN , ondansetron  **OR** ondansetron  (ZOFRAN ) IV, oxyCODONE -acetaminophen ,  traZODone   Assessment/ Plan:  43 y.o. 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  He has additional past medical history of chronic diastolic heart failure, GERD, anxiety, osteoarthritis, asthma, diabetes mellitus type 2, hypertension, hyperlipidemia, seizure disorder, arthrogryposis multiplex congenita.  1.  Acute kidney injury/chronic kidney disease stage II baseline creatinine 1.3 with prior history of allergic interstitial nephritis/FSGS/early diabetic glomerulosclerosis/proteinuria.  Prior renal biopsy performed 03/14/2022.  Patient well-known to us  from prior admission.  He had renal biopsy at that time.  It was felt that he had allergic interstitial nephritis.  He presents now with BUN of 60, creatinine of 4.93.  He reports diminished p.o. intake  now.  Urinalysis now shows 6-10 RBCs per high-power field urine protein of 30 mg/dL.  Renal ultrasound negative for hydronephrosis.   -Serologic workup pending with SPEP, UPEP, ANA, ANCA antibodies, GBM antibodies.    - Oral intake has improved, will stop IV hydration.  - Creatinine continues to improve as well with adequate urine output. - No acute indication for dialysis.  2. Anemia of chronic kidney disease Lab Results  Component Value Date   HGB 11.6 (L) 09/02/2023    Hemoglobin within optimal range  3. Diabetes mellitus type II with chronic kidney disease/renal manifestations: insulin  dependent. Home regimen includes Tresiba  and metformin . Most recent hemoglobin A1c is 9.8 on 08/31/23.   4.  Hypertension with chronic kidney disease.  Home regimen includes chlorthalidone , isosorbide , lisinopril , and metoprolol .  Chlorthalidone  and lisinopril  currently held.  Blood pressure remains elevated, 160/119.  Will increase to hydralazine  50 mg 3 times daily.    LOS: 3 Cory Campbell 7/27/202512:02 PM

## 2023-09-02 NOTE — Plan of Care (Signed)

## 2023-09-02 NOTE — TOC Progression Note (Signed)
 Transition of Care [facility]) - Progression Note    Patient Details  Name: Cory Campbell MRN: [id number] Date of Birth: 1980/11/07  [facility] CM/SW Contact  Lorraine LILLETTE [place], [place] Phone Number: 09/02/2023, 3:49 PM  Clinical Narrative:     [place] from MD and RN, pt clear for DC and needs Taxi voucher home. CSW called wife to clarify that she cannot pick him up.  CSW prepared taxi voucher, then called wife back to clarify if she had resources to get husband home-she does not.  Voucher sent to floor record and [place] to RN.  No further IPCM needs.                    Expected Discharge Plan and Services         Expected Discharge Date: 09/02/23                                     Social Drivers of Health (SDOH) Interventions SDOH Screenings   Food Insecurity: No Food Insecurity (08/30/2023)  Housing: Low Risk  (08/31/2023)  Transportation Needs: No Transportation Needs (08/30/2023)  Utilities: Not At Risk (08/30/2023)  Financial Resource Strain: Low Risk  (04/18/2021)   Received from [facility]  Social Connections: Moderately Integrated (08/30/2023)  Tobacco Use: Medium Risk (08/30/2023)    Readmission Risk Interventions    08/02/2023   10:07 AM 03/03/2022    4:01 PM 09/11/2021   10:27 AM  Readmission Risk Prevention Plan  Transportation Screening Complete Complete Complete  PCP or Specialist Appt within 3-5 Days Complete  Complete  HRI or Home Care Consult   Complete  Social Work Consult for Recovery Care Planning/Counseling Complete  Complete  Palliative Care Screening Not Applicable  Not Applicable  Medication Review Ocean[employer]) Complete Complete Complete  PCP or Specialist appointment within 3-5 days of discharge  Complete   HRI or Home Care Consult  Complete   SW Recovery Care/Counseling Consult  Complete   Palliative Care Screening  Not Applicable   Skilled Nursing Facility  Not Applicable

## 2023-09-02 NOTE — Progress Notes (Signed)
 PROGRESS NOTE    Cory Campbell  FMW:[id number] DOB: [birth year] DOA: 08/30/2023 PCP: Lorel Maxie LABOR, MD   Assessment & Plan:   Principal Problem:   Acute kidney injury superimposed on chronic kidney disease (HCC) Active Problems:   Type 2 diabetes mellitus with chronic kidney disease, with long-term current use of insulin  (HCC)   Essential hypertension   Dyslipidemia   GERD without esophagitis   Depression   Asthma, chronic  Assessment and Plan: AKI on CKDII: as per nephro. 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 IVFs d/c as per nephro. Cr is trending down again today. Serology work up is pending as per nephro. Nephro following and recs apprec    DM2: poorly controlled, HbA1c 8.1. Continue on glargine, SSI w/ accuchecks. Unlikely to restart metformin  secondary to progressive CKD   HIV: usually takes cabenuva and last dose given August 21, 2023 as per pt. Cabenuva qiven q58months  Metabolic acidosis: resolved  Peripheral neuropathy: continue on home dose of gabapentin     HTN: continue on home dose of isordil , metoprolol     Asthma: unknown stage and/or severity. W/o acute exacerbation. Bronchodilators prn     Depression: severity unknown. Continue on home dose of bupropion    GERD: continue on PPI    HLD: continue on statin       DVT prophylaxis: lovenox  Code Status: full  Family Communication: discussed pt's care w/ pt's wife, Cory Campbell, and answered her questions  Disposition Plan: likely d/c back home  Level of care: Telemetry Medical  Status is: Inpatient Remains inpatient appropriate because: severity of illness    Consultants:  Nephro   Procedures:   Antimicrobials:    Subjective: Pt c/o malaise   Objective: Vitals:   09/01/23 1929 09/01/23 1956 09/02/23 0226 09/02/23 0723  BP: (!) 141/102  (!) 137/100 (!) 160/119  Pulse: 93  85 81  Resp: 18  18 17   Temp: 98.2 F (36.8 C)  98.1 F (36.7 C) 98.1  F (36.7 C)  TempSrc: Oral  Oral Oral  SpO2: 98% 97% 100% 99%  Weight:      Height:        Intake/Output Summary (Last 24 hours) at 09/02/2023 0856 Last data filed at 09/02/2023 9366 Gross per 24 hour  Intake 1818.67 ml  Output 3050 ml  Net -1231.33 ml   Filed Weights   08/30/23 1728  Weight: 63 kg    Examination:  General exam: appears calm & comfortable. Poor dentition  Respiratory system: clear breath sounds b/l  Cardiovascular system: S1 & S2+. No rubs or clicks  Gastrointestinal system: Abd is soft, NT, ND & normal bowel sounds  Central nervous system: alert & oriented.  Psychiatry: judgement and insight appears at baseline. Appropriate mood and affec    Data Reviewed: I have personally reviewed following labs and imaging studies  CBC: Recent Labs  Lab 08/30/23 1731 08/31/23 0359 09/01/23 0439 09/02/23 0516  WBC 6.4 5.5 7.0 6.3  HGB 13.0 12.1* 11.5* 11.6*  HCT 41.2 36.9* 36.0* 36.3*  MCV 83.7 81.6 82.9 81.8  PLT 287 253 238 274   Basic Metabolic Panel: Recent Labs  Lab 08/30/23 1731 08/31/23 0359 09/01/23 0439 09/02/23 0516  NA 134* 137 137 138  K 4.3 4.1 4.0 3.6  CL 99 105 104 107  CO2 20* 21* 23 22  GLUCOSE 105* 284* 282* 271*  BUN 60* 61* 49* 41*  CREATININE 4.93* 5.02* 4.44* 3.62*  CALCIUM  10.9* 9.8 9.6 8.9  GFR: Estimated Creatinine Clearance: 23.4 mL/min (A) (by C-G formula based on SCr of 3.62 mg/dL (H)). Liver Function Tests: No results for input(s): AST, ALT, ALKPHOS, BILITOT, PROT, ALBUMIN in the last 168 hours. No results for input(s): LIPASE, AMYLASE in the last 168 hours. No results for input(s): AMMONIA in the last 168 hours. Coagulation Profile: No results for input(s): INR, PROTIME in the last 168 hours. Cardiac Enzymes: No results for input(s): CKTOTAL, CKMB, CKMBINDEX, TROPONINI in the last 168 hours. BNP (last 3 results) No results for input(s): PROBNP in the last 8760  hours. HbA1C: Recent Labs    08/31/23 0359  HGBA1C 9.8*   CBG: Recent Labs  Lab 09/01/23 0812 09/01/23 1138 09/01/23 1712 09/01/23 2147 09/02/23 0758  GLUCAP 226* 250* 163* 266* 242*   Lipid Profile: No results for input(s): CHOL, HDL, LDLCALC, TRIG, CHOLHDL, LDLDIRECT in the last 72 hours. Thyroid Function Tests: No results for input(s): TSH, T4TOTAL, FREET4, T3FREE, THYROIDAB in the last 72 hours. Anemia Panel: No results for input(s): VITAMINB12, FOLATE, FERRITIN, TIBC, IRON , RETICCTPCT in the last 72 hours. Sepsis Labs: Recent Labs  Lab 08/30/23 1957 08/30/23 2252  LATICACIDVEN 3.7* 2.1*    No results found for this or any previous visit (from the past 240 hours).       Radiology Studies: US  RENAL Result Date: 08/31/2023 CLINICAL DATA:  Acute kidney injury. EXAM: RENAL / URINARY TRACT ULTRASOUND COMPLETE COMPARISON:  March 02, 2022. FINDINGS: Right Kidney: Renal measurements: 11.5 x 6.7 x 7.0 cm = volume: 281 mL. Increased echogenicity of renal parenchyma is noted suggesting medical renal disease. No mass or hydronephrosis visualized. Left Kidney: Renal measurements: 9.9 x 6.0 x 4.9 cm = volume: 153 mL. Increased echogenicity of renal parenchyma is noted suggesting medical renal disease. No mass or hydronephrosis visualized. Bladder: Appears normal for degree of bladder distention. Other: None. IMPRESSION: Increased echogenicity of renal parenchyma is noted bilaterally suggesting medical renal disease. No hydronephrosis or renal obstruction is noted. Electronically Signed   By: Lynwood Landy Raddle M.D.   On: 08/31/2023 10:07        Scheduled Meds:  acyclovir   400 mg Oral TID   budesonide  (PULMICORT ) nebulizer solution  0.25 mg Nebulization BID   [START ON 09/04/2023] buprenorphine   1 patch Transdermal Weekly   buPROPion   150 mg Oral Daily   cloNIDine   0.2 mg Transdermal Weekly   dicyclomine   20 mg Oral BID AC   enoxaparin   (LOVENOX ) injection  30 mg Subcutaneous Q24H   famotidine   20 mg Oral Daily   feeding supplement (GLUCERNA SHAKE)  237 mL Oral QID   ferrous sulfate   325 mg Oral Q breakfast   fesoterodine   4 mg Oral Daily   gabapentin   300 mg Oral TID   hydrALAZINE   25 mg Oral TID   insulin  aspart  0-5 Units Subcutaneous QHS   insulin  aspart  0-9 Units Subcutaneous TID WC   insulin  glargine-yfgn  15 Units Subcutaneous QHS   isosorbide  dinitrate  10 mg Oral TID   loratadine   10 mg Oral Daily   metoprolol  succinate  100 mg Oral BID WC   multivitamin with minerals  1 tablet Oral Daily   nortriptyline   50 mg Oral BID   pantoprazole   40 mg Oral Daily   polyethylene glycol  17 g Oral TID   rosuvastatin   40 mg Oral Daily   Continuous Infusions:  sodium chloride  100 mL/hr at 09/01/23 1500      LOS: 3 days  Anthony CHRISTELLA Pouch, MD [facility] Pager [phone]  If 7PM-7AM, please contact night-coverage [email or web address] 09/02/2023, 8:56 AM

## 2023-09-02 NOTE — Discharge Summary (Addendum)
 Physician Discharge Summary  Saqib Cazarez Ricotta FMW:[id number] DOB: 1980-12-08 DOA: 08/30/2023  PCP: Lorel Maxie LABOR, MD  Admit date: 08/30/2023 Discharge date: 09/02/2023  Admitted From: home Disposition:  home  Recommendations for Outpatient Follow-up:  Follow up with PCP in 1-2 weeks F/u w/ [facility] neph[facility] in 1-2 weeks  Home Health: Equipment/Devices:  Discharge Condition: stable  CODE STATUS: full  Diet recommendation: Heart Healthy / Carb Modified  Brief/Interim Summary: HPI was taken from Dr. Lawence: Carlin PARAS Dorame is a 43 y.o. African-American male with medical history significant for HFpEF, GERD, anxiety, osteoarthritis, asthma, type 2 diabetes mellitus, essential hypertension, dyslipidemia and seizure disorder as well as OSA, who presented to the emergency room with acute onset of generalized fatigue and weakness over the last 4 days.  He admitted to nausea without vomiting or diarrhea.  He has been having significant diminished appetite.  No fever or chills.  No cough or wheezing or dyspnea.  No chest pain or palpitations.  No paresthesias or focal muscle weakness.   ED Course: When the patient came to the ER, BP was 154/100 with otherwise normal vital signs.  Labs revealed mild hyponatremia 134 and albumin of 60 with a creatinine 4.93 compared to 16/1.3 on 08/01/2023.  Lactic acid was 3.7 and later 2.1.  UA came back negative.  CBC was within normal. EKG as reviewed by me : None Imaging: None.   The patient was given 1 L bolus of IV normal saline.  He will be admitted to a medical telemetry bed for further evaluation and management.  Discharge Diagnoses:  Principal Problem:   Acute kidney injury superimposed on chronic kidney disease (HCC) Active Problems:   Type 2 diabetes mellitus with chronic kidney disease, with long-term current use of insulin  (HCC)   Essential hypertension   Dyslipidemia   GERD without esophagitis   Depression   Asthma, chronic  AKI on CKDII: as per  nephro. 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 IVFs d/c as per nephro. Cr is trending down again today. Serology work up is pending as per nephro. Nephro following and recs apprec    DM2: poorly controlled, HbA1c 8.1. Continue on glargine, SSI w/ accuchecks. D/ metformin  secondary to progressive CKD   HIV: usually takes cabenuva and last dose given August 21, 2023 as per pt. Cabenuva qiven q32months   Metabolic acidosis: resolved   Peripheral neuropathy: continue on home dose of gabapentin     HTN: continue on home dose of isordil , metoprolol . Restart home dose of clonidine , lisinopril , chlorthalidone . Increased dose of hydralazine  as per nephro.   Asthma: unknown stage and/or severity. W/o acute exacerbation. Bronchodilators prn     Depression: severity unknown. Continue on home dose of bupropion     GERD: continue on PPI    HLD: continue on statin   Discharge Instructions  Discharge Instructions     Diet - low sodium heart healthy   Complete by: As directed    Diet Carb Modified   Complete by: As directed    Discharge instructions   Complete by: As directed    F/u w/ PCP in 1-2 weeks. F/u w/ [facility] in 1-2 weeks   Increase activity slowly   Complete by: As directed       Allergies as of 09/02/2023       Reactions   Penicillins Hives, Itching, Rash   Has tolerated amoxicillin  and ampicillin  (as part of unasyn )   Erythromycin Base Itching, Rash  Medication List     STOP taking these medications    metFORMIN  500 MG 24 hr tablet Commonly known as: GLUCOPHAGE -XR       TAKE these medications    acyclovir  400 MG tablet Commonly known as: ZOVIRAX  Take 400 mg by mouth 3 (three) times daily.   AeroChamber Plus inhaler Use as instructed   albuterol  (2.5 MG/3ML) 0.083% nebulizer solution Commonly known as: PROVENTIL  Take 2.5 mg by nebulization every 4 (four) hours as needed for wheezing or shortness of  breath.   albuterol  108 (90 Base) MCG/ACT inhaler Commonly known as: VENTOLIN  HFA Inhale 2 puffs into the lungs every 4 (four) hours as needed for wheezing or shortness of breath.   buprenorphine  7.5 MCG/HR Commonly known as: BUTRANS  Place 7.5 mg onto the skin once a week. Tuesday   buPROPion  150 MG 24 hr tablet Commonly known as: WELLBUTRIN  XL Take 150 mg by mouth daily.   Cabenuva 600 & 900 MG/3ML injection Generic drug: cabotegravir & rilpivirine ER INJECT A TOTAL OF 6 ML IN THE MUSCLE ONCE A MONTH FOR 2 MONTHS THEN USE EVERY 2 MONTHS THEREAFTER.   cetirizine 10 MG tablet Commonly known as: ZYRTEC Take 10 mg by mouth daily.   chlorthalidone  25 MG tablet Commonly known as: HYGROTON  Take 25 mg by mouth daily.   cloNIDine  0.2 mg/24hr patch Commonly known as: CATAPRES  - Dosed in mg/24 hr 0.2 mg once a week. 1 Patch(s) Topical Once a Week   cyclobenzaprine  5 MG tablet Commonly known as: FLEXERIL  Take 5 mg by mouth 3 (three) times daily as needed.   Dexcom G7 Receiver Devi Dispense one   Dexcom G7 Sensor Misc Use every 10 days.   diclofenac  sodium 1 % Gel Commonly known as: VOLTAREN  Apply 2-4 g topically 4 (four) times daily as needed. For pain.   dicyclomine  10 MG capsule Commonly known as: BENTYL  Take 20 mg by mouth 2 (two) times daily before a meal.   famotidine  20 MG tablet Commonly known as: PEPCID  Take 20 mg by mouth daily.   feeding supplement (GLUCERNA SHAKE) Liqd Take 237 mLs by mouth 4 (four) times daily.   ferrous sulfate  325 (65 FE) MG tablet Take 325 mg by mouth daily with breakfast.   Fluocinolone Acetonide Scalp 0.01 % Oil Apply 1 Application topically 2 (two) times daily as needed.   fluticasone  110 MCG/ACT inhaler Commonly known as: FLOVENT  HFA Inhale 2 puffs into the lungs 2 (two) times daily.   fluticasone  50 MCG/ACT nasal spray Commonly known as: FLONASE  Place 2 sprays into both nostrils daily.   gabapentin  300 MG capsule Commonly  known as: NEURONTIN  Take 1 capsule (300 mg total) by mouth 2 (two) times daily. What changed: when to take this   glucose 4 GM chewable tablet Chew 1 tablet by mouth as needed for low blood sugar.   hydrALAZINE  50 MG tablet Commonly known as: APRESOLINE  Take 1 tablet (50 mg total) by mouth 3 (three) times daily.   Insulin  Aspart FlexPen 100 UNIT/ML Commonly known as: NOVOLOG  Inject 30 Units into the skin 3 (three) times daily before meals.   insulin  degludec 200 UNIT/ML FlexTouch Pen Commonly known as: TRESIBA  Inject 30 Units into the skin daily.   isosorbide  dinitrate 10 MG tablet Commonly known as: ISORDIL  Take 10 mg by mouth 3 (three) times daily.   Kerendia 10 MG Tabs Generic drug: Finerenone Take 10 mg by mouth daily.   ketoconazole  2 % shampoo Commonly known as: NIZORAL  Apply 1  Application topically 2 (two) times a week.   lidocaine  5 % Commonly known as: LIDODERM  2 patches daily.   lisinopril  40 MG tablet Commonly known as: ZESTRIL  Take 1 tablet by mouth daily.   melatonin 5 MG Tabs Take 10 mg by mouth at bedtime as needed (sleep).   metoprolol  succinate 100 MG 24 hr tablet Commonly known as: TOPROL -XL TAKE 1 TABLET(100 MG) BY MOUTH TWICE DAILY WITH OR IMMEDIATELY FOLLOWING A MEAL   multivitamin tablet Take 1 tablet by mouth daily.   nitroGLYCERIN  0.4 MG SL tablet Commonly known as: NITROSTAT  PLACE 1 TABLET UNDER THE TONGUE EVERY 5 MINUTES AS NEEDED FOR CHEST PAIN. CALL 911 IF YOU HAVE TAKEN 3 TABLETS FOR CHEST PAIN   nortriptyline  50 MG capsule Commonly known as: PAMELOR  Take 50 mg by mouth.  Take 1 capsule (50 mg total) by mouth nightly.   omega-3 acid ethyl esters 1 g capsule Commonly known as: LOVAZA  Take 1 g by mouth 3 (three) times daily.   omeprazole 20 MG capsule Commonly known as: PRILOSEC Take 20 mg by mouth daily.   ondansetron  4 MG disintegrating tablet Commonly known as: ZOFRAN -ODT Take 4 mg by mouth every 8 (eight) hours as  needed for nausea or vomiting.   polyethylene glycol 17 g packet Commonly known as: MIRALAX  / GLYCOLAX  Take 17 g by mouth 3 (three) times daily.   Potassium Chloride  ER 20 MEQ Tbcr Take 1 tablet by mouth daily.   rosuvastatin  40 MG tablet Commonly known as: CRESTOR  TAKE 1 TABLET(40 MG) BY MOUTH DAILY   solifenacin 10 MG tablet Commonly known as: VESICARE Take 10 mg by mouth daily.   T.E.D. Below Knee/Medium Misc 1 each by Does not apply route daily. Measure legs and give appropriate size.   Trulicity 4.5 MG/0.5ML Soaj Generic drug: Dulaglutide ADMINISTER 4.5 MG UNDER THE SKIN WEEKLY.        Allergies  Allergen Reactions   Penicillins Hives, Itching and Rash    Has tolerated amoxicillin  and ampicillin  (as part of unasyn )     Erythromycin Base Itching and Rash    Consultations: Nephro    Procedures/Studies: US  RENAL Result Date: 08/31/2023 CLINICAL DATA:  Acute kidney injury. EXAM: RENAL / URINARY TRACT ULTRASOUND COMPLETE COMPARISON:  March 02, 2022. FINDINGS: Right Kidney: Renal measurements: 11.5 x 6.7 x 7.0 cm = volume: 281 mL. Increased echogenicity of renal parenchyma is noted suggesting medical renal disease. No mass or hydronephrosis visualized. Left Kidney: Renal measurements: 9.9 x 6.0 x 4.9 cm = volume: 153 mL. Increased echogenicity of renal parenchyma is noted suggesting medical renal disease. No mass or hydronephrosis visualized. Bladder: Appears normal for degree of bladder distention. Other: None. IMPRESSION: Increased echogenicity of renal parenchyma is noted bilaterally suggesting medical renal disease. No hydronephrosis or renal obstruction is noted. Electronically Signed   By: Lynwood Landy Raddle M.D.   On: 08/31/2023 10:07   (Echo, Carotid, EGD, Colonoscopy, ERCP)    Subjective: Pt c/o fatigue    Discharge Exam: Vitals:   09/02/23 1137 09/02/23 1435  BP: (!) 150/110 (!) 145/117  Pulse: 92 84  Resp:  17  Temp:  (!) 97.5 F (36.4 C)  SpO2:  100% 100%   Vitals:   09/02/23 0723 09/02/23 1014 09/02/23 1137 09/02/23 1435  BP: (!) 160/119 (!) 140/103 (!) 150/110 (!) 145/117  Pulse: 81  92 84  Resp: 17   17  Temp: 98.1 F (36.7 C)   (!) 97.5 F (36.4 C)  TempSrc: Oral  Oral  SpO2: 99%  100% 100%  Weight:      Height:        General: Pt is alert, awake, not in acute distress Cardiovascular: S1/S2 +, no rubs, no gallops Respiratory: CTA bilaterally, no wheezing, no rhonchi Abdominal: Soft, NT, ND, bowel sounds + Extremities: no edema, no cyanosis    The results of significant diagnostics from this hospitalization (including imaging, microbiology, ancillary and laboratory) are listed below for reference.     Microbiology: No results found for this or any previous visit (from the past 240 hours).   Labs: BNP (last 3 results) Recent Labs    07/30/23 1437  BNP 16.6   Basic Metabolic Panel: Recent Labs  Lab 08/30/23 1731 08/31/23 0359 09/01/23 0439 09/02/23 0516  NA 134* 137 137 138  K 4.3 4.1 4.0 3.6  CL 99 105 104 107  CO2 20* 21* 23 22  GLUCOSE 105* 284* 282* 271*  BUN 60* 61* 49* 41*  CREATININE 4.93* 5.02* 4.44* 3.62*  CALCIUM  10.9* 9.8 9.6 8.9   Liver Function Tests: No results for input(s): AST, ALT, ALKPHOS, BILITOT, PROT, ALBUMIN in the last 168 hours. No results for input(s): LIPASE, AMYLASE in the last 168 hours. No results for input(s): AMMONIA in the last 168 hours. CBC: Recent Labs  Lab 08/30/23 1731 08/31/23 0359 09/01/23 0439 09/02/23 0516  WBC 6.4 5.5 7.0 6.3  HGB 13.0 12.1* 11.5* 11.6*  HCT 41.2 36.9* 36.0* 36.3*  MCV 83.7 81.6 82.9 81.8  PLT 287 253 238 274   Cardiac Enzymes: No results for input(s): CKTOTAL, CKMB, CKMBINDEX, TROPONINI in the last 168 hours. BNP: Invalid input(s): POCBNP CBG: Recent Labs  Lab 09/01/23 1138 09/01/23 1712 09/01/23 2147 09/02/23 0758 09/02/23 1201  GLUCAP 250* 163* 266* 242* 251*   D-Dimer No results for  input(s): DDIMER in the last 72 hours. Hgb A1c Recent Labs    08/31/23 0359  HGBA1C 9.8*   Lipid Profile No results for input(s): CHOL, HDL, LDLCALC, TRIG, CHOLHDL, LDLDIRECT in the last 72 hours. Thyroid function studies No results for input(s): TSH, T4TOTAL, T3FREE, THYROIDAB in the last 72 hours.  Invalid input(s): FREET3 Anemia work up No results for input(s): VITAMINB12, FOLATE, FERRITIN, TIBC, IRON , RETICCTPCT in the last 72 hours. Urinalysis    Component Value Date/Time   COLORURINE STRAW (A) 08/30/2023 1930   APPEARANCEUR CLEAR (A) 08/30/2023 1930   APPEARANCEUR Cloudy 05/16/2014 0350   LABSPEC 1.009 08/30/2023 1930   LABSPEC 1.018 05/16/2014 0350   PHURINE 6.0 08/30/2023 1930   GLUCOSEU NEGATIVE 08/30/2023 1930   GLUCOSEU Negative 05/16/2014 0350   HGBUR SMALL (A) 08/30/2023 1930   BILIRUBINUR NEGATIVE 08/30/2023 1930   BILIRUBINUR Negative 05/16/2014 0350   KETONESUR NEGATIVE 08/30/2023 1930   PROTEINUR 30 (A) 08/30/2023 1930   NITRITE NEGATIVE 08/30/2023 1930   LEUKOCYTESUR NEGATIVE 08/30/2023 1930   LEUKOCYTESUR 1+ 05/16/2014 0350   Sepsis Labs Recent Labs  Lab 08/30/23 1731 08/31/23 0359 09/01/23 0439 09/02/23 0516  WBC 6.4 5.5 7.0 6.3   Microbiology No results found for this or any previous visit (from the past 240 hours).   Time coordinating discharge:32 minutes  SIGNED:   Anthony CHRISTELLA Pouch, MD  [facility] 09/02/2023, 3:00 PM Pager   If 7PM-7AM, please contact night-coverage [email or web address]

## 2023-09-02 NOTE — Plan of Care (Signed)
  Problem: Education: Goal: Knowledge of General Education information will improve Description: Including pain rating scale, medication(s)/side effects and non-pharmacologic comfort measures 09/02/2023 1547 by Jama Joane BIRCH, RN Outcome: Adequate for Discharge 09/02/2023 1547 by Jama Joane BIRCH, RN Outcome: Progressing   Problem: Health Behavior/Discharge Planning: Goal: Ability to manage health-related needs will improve 09/02/2023 1547 by Jama Joane BIRCH, RN Outcome: Adequate for Discharge 09/02/2023 1547 by Jama Joane BIRCH, RN Outcome: Progressing   Problem: Clinical Measurements: Goal: Ability to maintain clinical measurements within normal limits will improve 09/02/2023 1547 by Jama Joane BIRCH, RN Outcome: Adequate for Discharge 09/02/2023 1547 by Jama Joane BIRCH, RN Outcome: Progressing Goal: Will remain free from infection 09/02/2023 1547 by Jama Joane BIRCH, RN Outcome: Adequate for Discharge 09/02/2023 1547 by Jama Joane BIRCH, RN Outcome: Progressing Goal: Diagnostic test results will improve 09/02/2023 1547 by Jama Joane BIRCH, RN Outcome: Adequate for Discharge 09/02/2023 1547 by Jama Joane BIRCH, RN Outcome: Progressing Goal: Respiratory complications will improve 09/02/2023 1547 by Jama Joane BIRCH, RN Outcome: Adequate for Discharge 09/02/2023 1547 by Jama Joane BIRCH, RN Outcome: Progressing Goal: Cardiovascular complication will be avoided 09/02/2023 1547 by Jama Joane BIRCH, RN Outcome: Adequate for Discharge 09/02/2023 1547 by Jama Joane BIRCH, RN Outcome: Progressing

## 2023-09-03 LAB — ANA W/REFLEX IF POSITIVE: Anti Nuclear Antibody (ANA): NEGATIVE

## 2023-09-03 LAB — ANCA PROFILE
Anti-MPO Antibodies: <0.2 U (ref 0.0–0.9)
Anti-PR3 Antibodies: <0.2 U (ref 0.0–0.9)
Atypical P-ANCA titer: <1:20 {titer}
C-ANCA: <1:20 {titer}
P-ANCA: <1:20 {titer}

## 2023-09-03 LAB — GLOMERULAR BASEMENT MEMBRANE ANTIBODIES: GBM Ab: <0.2 U (ref 0.0–0.9)

## 2023-09-04 LAB — MULTIPLE MYELOMA PANEL, SERUM
Albumin SerPl Elph-Mcnc: 3.1 g/dL (ref 2.9–4.4)
Albumin/Glob SerPl: 1 (ref 0.7–1.7)
Alpha 1: 0.3 g/dL (ref 0.0–0.4)
Alpha2 Glob SerPl Elph-Mcnc: 1 g/dL (ref 0.4–1.0)
B-Globulin SerPl Elph-Mcnc: 1 g/dL (ref 0.7–1.3)
Gamma Glob SerPl Elph-Mcnc: 0.9 g/dL (ref 0.4–1.8)
Globulin, Total: 3.2 g/dL (ref 2.2–3.9)
IgA: 232 mg/dL (ref 90–386)
IgG (Immunoglobin G), Serum: 1140 mg/dL (ref 603–1613)
IgM (Immunoglobulin M), Srm: 42 mg/dL (ref 20–172)
Total Protein ELP: 6.3 g/dL (ref 6.0–8.5)

## 2023-09-04 LAB — HIV-1/2 AB - DIFFERENTIATION
HIV 1 Ab: REACTIVE
HIV 2 Ab: NONREACTIVE

## 2023-09-04 LAB — C4 COMPLEMENT: Complement C4, Body Fluid: 35 mg/dL (ref 12–38)

## 2023-09-04 LAB — C3 COMPLEMENT: C3 Complement: 161 mg/dL (ref 82–167)

## 2023-10-28 ENCOUNTER — Encounter: Payer: Self-pay | Admitting: Emergency Medicine

## 2023-10-28 ENCOUNTER — Ambulatory Visit
Admission: EM | Admit: 2023-10-28 | Discharge: 2023-10-28 | Disposition: A | Discharge status: Home / Self Care | Attending: Family Medicine | Admitting: Family Medicine

## 2023-10-28 DIAGNOSIS — J0100 Acute maxillary sinusitis, unspecified: Secondary | ICD-10-CM | POA: Diagnosis not present

## 2023-10-28 DIAGNOSIS — R0789 Other chest pain: Secondary | ICD-10-CM | POA: Diagnosis not present

## 2023-10-28 MED ORDER — CYCLOBENZAPRINE HCL 5 MG PO TABS: 5.0000 mg | ORAL_TABLET | Freq: Three times a day (TID) | ORAL | 0 refills | Status: DC | PRN

## 2023-10-28 MED ORDER — AZITHROMYCIN 250 MG PO TABS: ORAL_TABLET | ORAL | 0 refills | Status: AC

## 2023-10-28 NOTE — ED Provider Notes (Signed)
 [facility]    CSN: [id number] Arrival date & time: 10/28/23  1254      History   Chief Complaint Chief Complaint  Patient presents with   Sinus Problem   Chest Pain    HPI Cory Campbell is a 43 y.o. male.   HPI  History obtained from the patient. Cory Campbell presents for sinus pain and pressure with nasal congestion that started 5 days ago but got worse yesterday.  Has bad headache and the drainage increased. Has pain in the back of his throat.  Took some Tylenol  but it didn't help but taking a muscle relaxer helped his headache.   Has some lower midline chest pain that gets worse with drinking and eating. Takes Prilosec and Pepcid  daily. Denies skipping any doses.  Hurts in one area when he presses on it.     Past Medical History:  Diagnosis Date   (HFpEF) heart failure with preserved ejection fraction (HCC)    Acid reflux    Adopted    [facility] (arthrogryposis multiplex congenita)    wheelchair bound because of this   Anxiety    Arthritis    Arthrogryposis    Asthma    BPH (benign prostatic hyperplasia)    Bronchitis    CHF (congestive heart failure) (HCC)    Chronic pain syndrome    Depressive disorder, not elsewhere classified    Diabetes mellitus without complication (HCC)    Diverticulitis of colon (without mention of hemorrhage)(562.11)    ED (erectile dysfunction)    Herpes genitalis    HIV infection (HCC)    HTN (hypertension)    Hyperlipidemia    Hypertensive cardiomyopathy (HCC)    Hypogonadism in male    OAB (overactive bladder)    Other psoriasis    Renal disorder    Seizure disorder (HCC)    Sleep apnea    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 or other cause, without mention of thyrotoxic crisis or storm    hyperthyroidism    Patient Active Problem List   Diagnosis Date Noted   Dyslipidemia 08/31/2023   GERD without esophagitis 08/31/2023   Depression 08/31/2023   Asthma, chronic 08/31/2023   Type 2 diabetes mellitus with  chronic kidney disease, with long-term current use of insulin  (HCC) 08/31/2023   Acute kidney injury superimposed on chronic kidney disease (HCC) 08/30/2023   Prostate abscess 08/02/2023   Prostatic abscess 07/30/2023   Acute renal failure superimposed on stage 3a chronic kidney disease (HCC) 07/30/2023   Type II diabetes mellitus with renal manifestations (HCC) 07/30/2023   Severe sepsis (HCC) 07/30/2023   Cystitis 07/27/2023   Dysuria 07/27/2023   History of hypertension 07/27/2023   Renal insufficiency 04/13/2022   End stage renal disease (HCC) 04/11/2022   HLD (hyperlipidemia) 04/11/2022   Hyperkalemia 03/02/2022   Hyponatremia 03/02/2022   Metabolic acidosis 03/02/2022   UTI (urinary tract infection) 11/24/2021   Proctitis 11/24/2021   Diabetes mellitus without complication (HCC) 11/24/2021   Hypokalemia 11/24/2021   Pancreatitis 11/24/2021   Asthma 11/24/2021   Depression with anxiety 11/24/2021   Overweight (BMI 25.0-29.9) 11/24/2021   BPH (benign prostatic hyperplasia) 11/24/2021   Chronic diastolic CHF (congestive heart failure) (HCC) 11/24/2021   Elevated lactic acid level 11/24/2021   Pedal edema 09/12/2021   Bilateral foot pain 09/09/2021   Lactic acidosis 09/09/2021   Tobacco dependence 09/09/2021   Weakness    AKI (acute kidney injury) (HCC) 06/05/2021   Postural dizziness with presyncope 06/05/2021   Arthrogryposis,  with contractures and functional paraplegia    Seizure disorder (HCC)    Headache    Acute asthma exacerbation 03/30/2020   Acute respiratory failure with hypoxia (HCC) 03/29/2020   Surgical wound, non healing    Acute colitis 05/20/2017   Colitis    Acute renal failure (HCC) 05/15/2017   Diarrhea 05/15/2017   Sepsis (HCC) 05/15/2017   Abdominal pain 11/25/2016   HIV (human immunodeficiency virus infection) (HCC) 11/19/2016   Hypotension 10/08/2016   Unstable angina (HCC) 10/06/2016   Shortness of breath 06/23/2015   Obesity 05/18/2014    Frequent falls 08/12/2013   Pain of left clavicle 08/12/2013   Tachycardia 01/21/2013   Chronic pain syndrome 08/12/2012   Sleep apnea 06/07/2012   Chest pain 06/20/2011   Hypertension associated with diabetes (HCC) 06/20/2011   Insulin  dependent type 2 diabetes mellitus (HCC) 06/20/2011   Diabetes mellitus (HCC) 06/20/2011   Asthma, chronic, unspecified asthma severity, with acute exacerbation 01/18/2008   Hyperlipidemia associated with type 2 diabetes mellitus (HCC) 08/28/2006   Essential hypertension 06/27/2005    Past Surgical History:  Procedure Laterality Date   APPENDECTOMY     COLOSTOMY     CORNEAL TRANSPLANT     DIALYSIS/PERMA CATHETER INSERTION Right 03/10/2022   Procedure: DIALYSIS/PERMA CATHETER INSERTION;  Surgeon: Jama Cordella MATSU, MD;  Location: [facility];  Service: Cardiovascular;  Laterality: Right;   DIALYSIS/PERMA CATHETER REMOVAL N/A 04/18/2022   Procedure: DIALYSIS/PERMA CATHETER REMOVAL;  Surgeon: Jama Cordella MATSU, MD;  Location: [facility];  Service: Cardiovascular;  Laterality: N/A;   feet surgery     both   HAND SURGERY     left and right   leg surgery     left and right   ORTHOPEDIC SURGERY     hands, feet, knees, legs   tubes in ears     both       Home Medications    Prior to Admission medications   Medication Sig Start Date End Date Taking? Authorizing Provider  azithromycin  (ZITHROMAX  Z-PAK) 250 MG tablet Take 2 tablets on day 1 then 1 tablet daily 10/28/23  Yes Nishi Neiswonger, DO  acyclovir  (ZOVIRAX ) 400 MG tablet Take 400 mg by mouth 3 (three) times daily.    [provider]  albuterol  (PROVENTIL  HFA;VENTOLIN  HFA) 108 (90 BASE) MCG/ACT inhaler Inhale 2 puffs into the lungs every 4 (four) hours as needed for wheezing or shortness of breath.     [provider]  albuterol  (PROVENTIL ) (2.5 MG/3ML) 0.083% nebulizer solution Take 2.5 mg by nebulization every 4 (four) hours as needed for wheezing or  shortness of breath.    [provider]  buprenorphine  (BUTRANS ) 7.5 MCG/HR Place 7.5 mg onto the skin once a week. Tuesday    [provider]  buPROPion  (WELLBUTRIN  XL) 150 MG 24 hr tablet Take 150 mg by mouth daily. 08/11/21   [provider]  CABENUVA 600 & 900 MG/3ML injection INJECT A TOTAL OF 6 ML IN THE MUSCLE ONCE A MONTH FOR 2 MONTHS THEN USE EVERY 2 MONTHS THEREAFTER. 06/07/22   [provider]  cetirizine (ZYRTEC) 10 MG tablet Take 10 mg by mouth daily.    [provider]  chlorthalidone  (HYGROTON ) 25 MG tablet Take 25 mg by mouth daily. 12/04/22   [provider]  cloNIDine  (CATAPRES  - DOSED IN MG/24 HR) 0.2 mg/24hr patch 0.2 mg once a week. 1 Patch(s) Topical Once a Week 08/17/23   [provider]  Continuous Glucose Receiver (DEXCOM G7 RECEIVER) DEVI Dispense one 12/27/22   [provider]  Continuous Glucose Sensor (DEXCOM G7 SENSOR) MISC Use every 10 days. 11/13/22   [provider]  cyclobenzaprine  (FLEXERIL ) 5 MG tablet Take 1 tablet (5 mg total) by mouth 3 (three) times daily as needed. 10/28/23   Jazmina Muhlenkamp, DO  diclofenac  sodium (VOLTAREN ) 1 % GEL Apply 2-4 g topically 4 (four) times daily as needed. For pain.    [provider]  dicyclomine  (BENTYL ) 10 MG capsule Take 20 mg by mouth 2 (two) times daily before a meal. 05/01/21   [provider]  Dulaglutide (TRULICITY) 4.5 MG/0.5ML SOAJ ADMINISTER 4.5 MG UNDER THE SKIN WEEKLY.    [provider]  Elastic Bandages & Supports (T.E.D. BELOW KNEE/MEDIUM) MISC 1 each by Does not apply route daily. Measure legs and give appropriate size. 09/12/21   Rizwan, Saima, MD  famotidine  (PEPCID ) 20 MG tablet Take 20 mg by mouth daily.  01/03/16   [provider]  feeding supplement, GLUCERNA SHAKE, (GLUCERNA SHAKE) LIQD Take 237 mLs by mouth 4 (four) times daily.    [provider]  ferrous sulfate  325 (65 FE) MG tablet Take  325 mg by mouth daily with breakfast.    [provider]  Fluocinolone Acetonide Scalp 0.01 % OIL Apply 1 Application topically 2 (two) times daily as needed.    [provider]  fluticasone  (FLONASE ) 50 MCG/ACT nasal spray Place 2 sprays into both nostrils daily. 07/04/22   Van Knee, MD  fluticasone  (FLOVENT  HFA) 110 MCG/ACT inhaler Inhale 2 puffs into the lungs 2 (two) times daily.    [provider]  gabapentin  (NEURONTIN ) 300 MG capsule Take 1 capsule (300 mg total) by mouth 2 (two) times daily. Patient taking differently: Take 300 mg by mouth 3 (three) times daily. 09/12/21 07/31/23  Rizwan, Saima, MD  glucose 4 GM chewable tablet Chew 1 tablet by mouth as needed for low blood sugar.    [provider]  hydrALAZINE  (APRESOLINE ) 50 MG tablet Take 1 tablet (50 mg total) by mouth 3 (three) times daily. 09/02/23 10/02/23  Trudy Anthony HERO, MD  Insulin  Aspart FlexPen (NOVOLOG ) 100 UNIT/ML Inject 30 Units into the skin 3 (three) times daily before meals. 07/26/23   [provider]  insulin  degludec (TRESIBA ) 200 UNIT/ML FlexTouch Pen Inject 30 Units into the skin daily. 07/26/23   [provider]  isosorbide  dinitrate (ISORDIL ) 10 MG tablet Take 10 mg by mouth 3 (three) times daily.    [provider]  KERENDIA 10 MG TABS Take 10 mg by mouth daily. 07/26/23   [provider]  ketoconazole  (NIZORAL ) 2 % shampoo Apply 1 Application topically 2 (two) times a week. 12/18/22   [provider]  lidocaine  (LIDODERM ) 5 % 2 patches daily.    [provider]  lisinopril  (ZESTRIL ) 40 MG tablet Take 1 tablet by mouth daily. 06/18/23   [provider]  Melatonin 5 MG TABS Take 10 mg by mouth at bedtime as needed (sleep).    [provider]  metoprolol  succinate (TOPROL -XL) 100 MG 24 hr tablet TAKE 1 TABLET(100 MG) BY MOUTH TWICE DAILY WITH OR IMMEDIATELY FOLLOWING A MEAL 04/30/23   Gollan, Timothy J, MD   Multiple Vitamin (MULTIVITAMIN) tablet Take 1 tablet by mouth daily.    [provider]  nitroGLYCERIN  (NITROSTAT ) 0.4 MG SL tablet PLACE 1 TABLET UNDER THE TONGUE EVERY 5 MINUTES AS NEEDED FOR CHEST PAIN. CALL  911 IF YOU HAVE TAKEN 3 TABLETS FOR CHEST PAIN 08/03/22   Gollan, Timothy J, MD  nortriptyline  (PAMELOR ) 50 MG capsule Take 50 mg by mouth.  Take 1 capsule (50 mg total) by mouth nightly. 08/22/23   [provider]  omega-3 acid ethyl esters (LOVAZA ) 1 g capsule Take 1 g by mouth 3 (three) times daily.    [provider]  omeprazole (PRILOSEC) 20 MG capsule Take 20 mg by mouth daily. 09/08/21   [provider]  ondansetron  (ZOFRAN -ODT) 4 MG disintegrating tablet Take 4 mg by mouth every 8 (eight) hours as needed for nausea or vomiting.    [provider]  polyethylene glycol (MIRALAX  / GLYCOLAX ) 17 g packet Take 17 g by mouth 3 (three) times daily.    [provider]  Potassium Chloride  ER 20 MEQ TBCR Take 1 tablet by mouth daily.    [provider]  rosuvastatin  (CRESTOR ) 40 MG tablet TAKE 1 TABLET(40 MG) BY MOUTH DAILY 04/30/19   Gollan, Timothy J, MD  solifenacin (VESICARE) 10 MG tablet Take 10 mg by mouth daily.    [provider]  Spacer/Aero-Holding Chambers (AEROCHAMBER PLUS) inhaler Use as instructed 03/23/18   Van Knee, MD    Family History Family History  Adopted: Yes  Family history unknown: Yes    Social History Social History   Tobacco Use   Smoking status: Former    Types: Pipe, Software engineer, E-cigarettes    Quit date: 01/2018    Years since quitting: 5.8   Smokeless tobacco: Never   Tobacco comments:    Declines patch  Vaping Use   Vaping status: Former   Substances: Nicotine , Flavoring  Substance Use Topics   Alcohol use: Not Currently    Alcohol/week: 3.0 standard drinks of alcohol    Types: 3 Cans of beer per week   Drug use: No     Allergies   Penicillins and Erythromycin  base   Review of Systems Review of Systems: negative unless otherwise stated in HPI.      Physical Exam Triage Vital Signs ED Triage Vitals  Encounter Vitals Group     BP 10/28/23 1308 (!) 141/109     Girls Systolic BP Percentile --      Girls Diastolic BP Percentile --      Boys Systolic BP Percentile --      Boys Diastolic BP Percentile --      Pulse Rate 10/28/23 1308 95     Resp 10/28/23 1308 16     Temp 10/28/23 1308 98.4 F (36.9 C)     Temp Source 10/28/23 1308 Oral     SpO2 10/28/23 1308 97 %     Weight 10/28/23 1306 138 lb 14.2 oz (63 kg)     Height 10/28/23 1306 5' 6 (1.676 m)     Head Circumference --      Peak Flow --      Pain Score 10/28/23 1306 9     Pain Loc --      Pain Education --      Exclude from Growth Chart --    No data found.  Updated Vital Signs BP (!) 141/106   Pulse 95   Temp 98.4 F (36.9 C) (Oral)   Resp 16   Ht 5' 6 (1.676 m)   Wt 63 kg   SpO2 97%   BMI 22.42 kg/m   Visual Acuity Right Eye Distance:   Left Eye Distance:   Bilateral Distance:  Right Eye Near:   Left Eye Near:    Bilateral Near:     Physical Exam GEN:     alert, non-toxic appearing male in no distress    HENT:  mucus membranes moist, oropharyngeal without lesions or erythema, no tonsillar hypertrophy or exudates, thick nasal discharge, bilateral maxillary > frontal sinus tenderness  EYES:   no scleral injection or discharge RESP:  no increased work of breathing, clear to auscultation bilaterally CVS:   regular rate and rhythm Skin:   warm and dry    UC Treatments / Results  Labs (all labs ordered are listed, but only abnormal results are displayed) Labs Reviewed - No data to display  EKG   Radiology No results found.   Procedures Procedures (including critical care time)  Medications Ordered in UC Medications - No data to display  Initial Impression / Assessment and Plan / UC Course  I have reviewed the triage vital signs and the  nursing notes.  Pertinent labs & imaging results that were available during my care of the patient were reviewed by me and considered in my medical decision making (see chart for details).       Pt is a 43 y.o. male who presents for 1 week of nasal congestion that is not improving.  Cory Campbell is  afebrile here without recent antipyretics. Satting well on room air. Overall pt is  non-toxic appearing, well hydrated, without respiratory distress. He has maxillary and frontal sinus tenderness bilaterally.  Treat acute sinusitis with azithromycin .   He has PMHx diastolic HF, GERD, asthma, chronic pain, HIV, hypertension, hyperlipidemia, seizure disorder, thyrotoxicosis, arthrogryposis multiplex congenita presents for lower central chest discomfort for 1 days.  VSS.  Cardiopulmonary exam is unremarkable.    EKG personally interpreted by me; NSR with T wave changes in the lateral leads; normal PR interval, no QT prolongation,  compared to prior EKG from 07/30/23 these are new; inferior changes have resolved. After shared decision making, we will not pursue chest x-ray at this time. Pain is reproducible on exam.  Suspect chest wall pain and we will treat with muscle relaxer. Considered GERD flare vs gallbladder disease. Pt to go to the ED for any persisting or worsening symptoms. Typical duration of symptoms discussed. Return and ED precautions given and patient voiced understanding. Discussed MDM, treatment plan and plan for follow-up with patient who agrees with plan.      Final Clinical Impressions(s) / UC Diagnoses   Final diagnoses:  Acute non-recurrent maxillary sinusitis  Atypical chest pain     Discharge Instructions      Stop by the pharmacy to pick up your prescriptions.  Follow up with your primary care provider or return to the urgent care, if not improving.    Go to ED for red flag symptoms, including; fevers you cannot reduce with Tylenol /Motrin , severe headaches, vision changes,  numbness/weakness in part of the body, lethargy, confusion, intractable vomiting, severe dehydration, chest pain, breathing difficulty, severe persistent abdominal or pelvic pain, signs of severe infection (increased redness, swelling of an area), feeling faint or passing out, dizziness, etc. You should especially go to the ED for sudden acute worsening of condition if you do not elect to go at this time.       ED Prescriptions     Medication Sig Dispense Auth. Provider   azithromycin  (ZITHROMAX  Z-PAK) 250 MG tablet Take 2 tablets on day 1 then 1 tablet daily 6 tablet Artrice Kraker, DO   cyclobenzaprine  (FLEXERIL ) 5  MG tablet Take 1 tablet (5 mg total) by mouth 3 (three) times daily as needed. 30 tablet Therasa Lorenzi, DO      PDMP not reviewed this encounter.   Shamarcus Hoheisel, DO 10/28/23 1418

## 2023-10-28 NOTE — Discharge Instructions (Signed)
 Stop by the pharmacy to pick up your prescriptions.  Follow up with your primary care provider or return to the urgent care, if not improving.   Go to ED for red flag symptoms, including; fevers you cannot reduce with Tylenol/Motrin, severe headaches, vision changes, numbness/weakness in part of the body, lethargy, confusion, intractable vomiting, severe dehydration, chest pain, breathing difficulty, severe persistent abdominal or pelvic pain, signs of severe infection (increased redness, swelling of an area), feeling faint or passing out, dizziness, etc. You should especially go to the ED for sudden acute worsening of condition if you do not elect to go at this time.

## 2023-10-28 NOTE — ED Triage Notes (Signed)
 Patient c/o sinus congestion, pressure and drainage that started on Tuesday and got worse yesterday.  Patient unsure of fevers.  Patient reports chest pain that started yesterday.  Patient denies cough or SOB.

## 2023-10-31 ENCOUNTER — Emergency Department
Admission: EM | Admit: 2023-10-31 | Discharge: 2023-10-31 | Disposition: A | Discharge status: Home / Self Care | Attending: Emergency Medicine | Admitting: Emergency Medicine

## 2023-10-31 ENCOUNTER — Other Ambulatory Visit: Payer: Self-pay

## 2023-10-31 ENCOUNTER — Emergency Department

## 2023-10-31 ENCOUNTER — Encounter: Payer: Self-pay | Admitting: *Deleted

## 2023-10-31 DIAGNOSIS — M545 Low back pain, unspecified: Secondary | ICD-10-CM | POA: Insufficient documentation

## 2023-10-31 DIAGNOSIS — M549 Dorsalgia, unspecified: Secondary | ICD-10-CM | POA: Diagnosis present

## 2023-10-31 DIAGNOSIS — R0981 Nasal congestion: Secondary | ICD-10-CM | POA: Diagnosis not present

## 2023-10-31 DIAGNOSIS — N179 Acute kidney failure, unspecified: Secondary | ICD-10-CM | POA: Insufficient documentation

## 2023-10-31 LAB — URINALYSIS, ROUTINE W REFLEX MICROSCOPIC
Bilirubin Urine: NEGATIVE
Glucose, UA: NEGATIVE mg/dL
Ketones, ur: NEGATIVE mg/dL
Leukocytes,Ua: NEGATIVE
Nitrite: NEGATIVE
Protein, ur: 30 mg/dL — AB
Specific Gravity, Urine: 1.006 (ref 1.005–1.030)
pH: 5 (ref 5.0–8.0)

## 2023-10-31 LAB — CBC
HCT: 43.8 % (ref 39.0–52.0)
Hemoglobin: 13.8 g/dL (ref 13.0–17.0)
MCH: 25.7 pg — ABNORMAL LOW (ref 26.0–34.0)
MCHC: 31.5 g/dL (ref 30.0–36.0)
MCV: 81.4 fL (ref 80.0–100.0)
Platelets: 264 K/uL (ref 150–400)
RBC: 5.38 MIL/uL (ref 4.22–5.81)
RDW: 14.6 % (ref 11.5–15.5)
WBC: 6.3 K/uL (ref 4.0–10.5)
nRBC: 0 % (ref 0.0–0.2)

## 2023-10-31 LAB — BASIC METABOLIC PANEL WITH GFR
Anion gap: 14 (ref 5–15)
BUN: 24 mg/dL — ABNORMAL HIGH (ref 6–20)
CO2: 24 mmol/L (ref 22–32)
Calcium: 9.5 mg/dL (ref 8.9–10.3)
Chloride: 95 mmol/L — ABNORMAL LOW (ref 98–111)
Creatinine, Ser: 1.52 mg/dL — ABNORMAL HIGH (ref 0.61–1.24)
GFR, Estimated: 58 mL/min — ABNORMAL LOW (ref >60)
Glucose, Bld: 160 mg/dL — ABNORMAL HIGH (ref 70–99)
Potassium: 3.8 mmol/L (ref 3.5–5.1)
Sodium: 133 mmol/L — ABNORMAL LOW (ref 135–145)

## 2023-10-31 MED ORDER — SODIUM CHLORIDE 0.9 % IV BOLUS
1000.0000 mL | Freq: Once | INTRAVENOUS | Status: AC
  Administered 2023-10-31: 1000 mL via INTRAVENOUS

## 2023-10-31 NOTE — ED Notes (Signed)
 Pt waiting in lobby for ride that is coming from [place], [place]. First nurse aware and pt is in her sight.

## 2023-10-31 NOTE — ED Provider Notes (Signed)
 [facility] Provider Note    Event Date/Time   First MD Initiated Contact with Patient 10/31/23 1734     (approximate)   History   Back Pain   HPI  Cory Campbell is a 43 year old male presenting to the emergency department for evaluation of back pain.  For the past few days patient has had pain over his left back.  Reports a history of significant kidney injury and is concerned about this.  Has a history of renal stones but says this does not feel similar.  Describes it as a dull achy pain over his left flank reproducible with palpation.  No identifiable injury.  Does report he is on antibiotics for recent sinus infection.  Reviewed urgent care visit from 9/21.  At that time patient presented with nasal congestion.  Reassuring EKG.  Suspected chest wall pain.  He was discharged with azithromycin  for suspected sinusitis and Flexeril  for chest wall pain.  Reviewed nephrology visit from 10/19/2023.  At that time patient was seen for CKD thought to be related to longstanding hypertension and prior NSAID use.  BMP at that time with creatinine of 1.07, much improved from 1 to 2 months prior with patient had significant AKI with creatinine up to 5.      Physical Exam   Triage Vital Signs: ED Triage Vitals  Encounter Vitals Group     BP 10/31/23 1552 (!) 138/98     Girls Systolic BP Percentile --      Girls Diastolic BP Percentile --      Boys Systolic BP Percentile --      Boys Diastolic BP Percentile --      Pulse Rate 10/31/23 1552 99     Resp 10/31/23 1552 18     Temp 10/31/23 1552 99.3 F (37.4 C)     Temp Source 10/31/23 1552 Oral     SpO2 10/31/23 1552 98 %     Weight 10/31/23 1554 140 lb (63.5 kg)     Height 10/31/23 1554 5' 6 (1.676 m)     Head Circumference --      Peak Flow --      Pain Score 10/31/23 1554 9     Pain Loc --      Pain Education --      Exclude from Growth Chart --     Most recent vital signs: Vitals:   10/31/23 1552  10/31/23 1735  BP: (!) 138/98   Pulse: 99   Resp: 18   Temp: 99.3 F (37.4 C)   SpO2: 98% 100%     General: Awake, interactive  CV:  Regular rate, good peripheral perfusion.  Resp:  Unlabored respirations, lungs clear to auscultation Abd:  Nondistended, soft, nontender Neuro:  Symmetric facial movement, fluid speech MSK:  Reproducible area of tenderness over the left lumbar paraspinous musculature without over lying skin changes  ED Results / Procedures / Treatments   Labs (all labs ordered are listed, but only abnormal results are displayed) Labs Reviewed  BASIC METABOLIC PANEL WITH GFR - Abnormal; Notable for the following components:      Result Value   Sodium 133 (*)    Chloride 95 (*)    Glucose, Bld 160 (*)    BUN 24 (*)    Creatinine, Ser 1.52 (*)    GFR, Estimated 58 (*)    All other components within normal limits  CBC - Abnormal; Notable for the following components:   MCH 25.7 (*)  All other components within normal limits  URINALYSIS, ROUTINE W REFLEX MICROSCOPIC - Abnormal; Notable for the following components:   Color, Urine YELLOW (*)    APPearance HAZY (*)    Hgb urine dipstick SMALL (*)    Protein, ur 30 (*)    Bacteria, UA RARE (*)    All other components within normal limits     EKG EKG independently reviewed and interpreted by myself demonstrates:    RADIOLOGY Imaging independently reviewed and interpreted by myself demonstrates:  CXR without focal consolidation  Formal Radiology Read:  [facility] Chest Port 1 View Result Date: 10/31/2023 CLINICAL DATA:  Cough EXAM: PORTABLE CHEST 1 VIEW COMPARISON:  07/30/2023 FINDINGS: The heart size and mediastinal contours are within normal limits. Both lungs are clear. The visualized skeletal structures are unremarkable. IMPRESSION: No active disease. Electronically Signed   By: Franky Crease M.D.   On: 10/31/2023 18:22    PROCEDURES:  Critical Care performed: No  Procedures   MEDICATIONS ORDERED IN  ED: Medications  sodium chloride  0.9 % bolus 1,000 mL (1,000 mLs Intravenous New Bag/Given 10/31/23 1827)     IMPRESSION / MDM / ASSESSMENT AND PLAN / ED COURSE  I reviewed the triage vital signs and the nursing notes.  Differential diagnosis includes, but is not limited to, musculoskeletal strain, very low suspicion for renal stone based on history, consideration for UTI/pyelonephritis, possible dehydration in the setting of recent sinus infection  Patient's presentation is most consistent with acute presentation with potential threat to life or bodily function.  43 year old male presenting with left lower back pain reproducible on exam.  Stable vitals on presentation.  Reassuring CBC, CMP does note mild AKI with creatinine 1.52.  This is much improved from some recent values but is up trended from most recent labs from his nephrology visit.  Urinalysis is without evidence of infection.  Suspect that patient may have AKI related to dehydration in the setting of his recent respiratory symptoms.  Does report some decreased p.o. intake.  Will give a liter of fluid here.  Patient is tolerating p.o. intake, no abdominal pain.  Do not think there is an indication for imaging currently.  X-Callin Ashe was obtained with recent respiratory symptoms which is without evidence of pneumonia.    Patient reassessed after fluids.  Denies any complaints.  He is comfortable discharge home and outpatient follow-up.  Strict return precautions provided.  Patient discharged in stable condition.      FINAL CLINICAL IMPRESSION(S) / ED DIAGNOSES   Final diagnoses:  Acute left-sided low back pain without sciatica  Acute kidney injury     Rx / DC Orders   ED Discharge Orders     None        Note:  This document was prepared using Dragon voice recognition software and may include unintentional dictation errors.   Levander Slate, MD 10/31/23 [phone]

## 2023-10-31 NOTE — Discharge Instructions (Signed)
 You are seen in the ER today for evaluation of your back pain.  I suspect this is likely related to a muscle strain.  Your kidney function here was slightly worse than at your recent nephrology visit, but much improved from some of your recent values.  I suspect the slight worsening may be related to dehydration.  You received IV fluids here.  Follow with your primary care doctor for further evaluation.  Return to the ER for new or worsening symptoms.  You can take Tylenol  to help with your back pain and use over-the-counter lidocaine  patches.  Avoid NSAIDs such as ibuprofen , Aleve , Advil .

## 2023-10-31 NOTE — ED Triage Notes (Signed)
 Pt brought in via ems from home with sinus infection, back pain.  Pt concerned about his kidneys.  No dysuria.  No injury to back    pt taking abx for sinus infection  pt alert.

## 2023-12-12 ENCOUNTER — Emergency Department
Admission: EM | Admit: 2023-12-12 | Discharge: 2023-12-12 | Disposition: A | Discharge status: Home / Self Care | Attending: Emergency Medicine | Admitting: Emergency Medicine

## 2023-12-12 ENCOUNTER — Encounter: Payer: Self-pay | Admitting: Emergency Medicine

## 2023-12-12 ENCOUNTER — Other Ambulatory Visit: Payer: Self-pay

## 2023-12-12 ENCOUNTER — Emergency Department

## 2023-12-12 DIAGNOSIS — M545 Low back pain, unspecified: Secondary | ICD-10-CM | POA: Diagnosis not present

## 2023-12-12 DIAGNOSIS — M25512 Pain in left shoulder: Secondary | ICD-10-CM | POA: Diagnosis not present

## 2023-12-12 DIAGNOSIS — I509 Heart failure, unspecified: Secondary | ICD-10-CM | POA: Diagnosis not present

## 2023-12-12 DIAGNOSIS — M25552 Pain in left hip: Secondary | ICD-10-CM | POA: Insufficient documentation

## 2023-12-12 DIAGNOSIS — M25551 Pain in right hip: Secondary | ICD-10-CM | POA: Diagnosis present

## 2023-12-12 DIAGNOSIS — Z21 Asymptomatic human immunodeficiency virus [HIV] infection status: Secondary | ICD-10-CM | POA: Diagnosis not present

## 2023-12-12 DIAGNOSIS — I110 Hypertensive heart disease with heart failure: Secondary | ICD-10-CM | POA: Insufficient documentation

## 2023-12-12 DIAGNOSIS — W19XXXA Unspecified fall, initial encounter: Secondary | ICD-10-CM | POA: Diagnosis not present

## 2023-12-12 MED ORDER — ACETAMINOPHEN 325 MG PO TABS
650.0000 mg | ORAL_TABLET | Freq: Once | ORAL | Status: AC
  Administered 2023-12-12: 650 mg via ORAL
  Filled 2023-12-12: qty 2

## 2023-12-12 MED ORDER — KETOROLAC TROMETHAMINE 30 MG/ML IJ SOLN
30.0000 mg | Freq: Once | INTRAMUSCULAR | Status: AC
  Administered 2023-12-12: 30 mg via INTRAMUSCULAR
  Filled 2023-12-12: qty 1

## 2023-12-12 NOTE — ED Triage Notes (Signed)
 First Nurse Note: Patient to ED from home after a fall this AM. Clemens trying to get into the car. C/o bilateral hip pain. Denies LOC or blood thinner.   341 cbg 99% RA 143/88

## 2023-12-12 NOTE — ED Triage Notes (Signed)
 Pt to ER states he tripped and fell this AM landing on the concrete.  States he is having low back, bilateral hip, right leg and left shoulder pain.  Denies hitting head or passing out.

## 2023-12-12 NOTE — Discharge Instructions (Signed)
 Your evaluated in the ED following fall.  Your bilateral hip x-ray, lumbar spine and left shoulder x-ray revealed no acute injuries.  Please perform light range of motion exercises of the left shoulder and follow-up with orthopedic.  Take Tylenol  as needed for pain and discomfort.  If any new or worsening symptoms occur please return to ED for further evaluation.  Pain control:  Acetaminophen  (tylenol ) - You can take 2 extra strength tablets (1000 mg) every 6 hours as needed for pain

## 2023-12-12 NOTE — ED Notes (Signed)
 Pt transported to xray

## 2023-12-12 NOTE — ED Provider Notes (Signed)
 [facility] Emergency Department Provider Note     Event Date/Time   First MD Initiated Contact with Patient 12/12/23 1618     (approximate)   History   Fall   HPI  Cory Campbell is a 43 y.o. male with a past medical history of HTN, arthrogryposis, chronic pain syndrome, diabetes, HIV and CHF presents to the ED for evaluation following a fall this morning while trying to get into his car.  Patient denies head injury or LOC.  Patient complains of bilateral hip pain with the right hip being worse than the left, left shoulder pain and lower back pain.  Patient denies loss of bladder or bowel control.  Patient reports he ambulates with the assistance of crutches which is his baseline.     Physical Exam   Triage Vital Signs: ED Triage Vitals  Encounter Vitals Group     BP 12/12/23 1541 (!) 128/90     Girls Systolic BP Percentile --      Girls Diastolic BP Percentile --      Boys Systolic BP Percentile --      Boys Diastolic BP Percentile --      Pulse Rate 12/12/23 1541 74     Resp 12/12/23 1541 18     Temp 12/12/23 1541 98 F (36.7 C)     Temp Source 12/12/23 1541 Oral     SpO2 12/12/23 1541 100 %     Weight 12/12/23 1541 140 lb (63.5 kg)     Height 12/12/23 1541 5' 6 (1.676 m)     Head Circumference --      Peak Flow --      Pain Score 12/12/23 1552 10     Pain Loc --      Pain Education --      Exclude from Growth Chart --     Most recent vital signs: Vitals:   12/12/23 1541  BP: (!) 128/90  Pulse: 74  Resp: 18  Temp: 98 F (36.7 C)  SpO2: 100%   General: Well appearing and comfortable. Alert and oriented. INAD.  Skin:  Warm, dry and intact. No rashes or lesions noted.     Head:  NCAT.  CV:  Good peripheral perfusion. No peripheral edema.  RESP:  Normal effort.  BACK:  Spinous process is midline without deformity.  Tenderness across the lumbar region including midline. MSK:   Bilateral braces on lower extremity limiting range  of motion however this is patient's baseline.  None tenderness to bilateral hips.  Passive range of motion with hip flexion > AROM -appears to be patient's baseline. NEURO: Cranial nerves intact.   ED Results / Procedures / Treatments   Labs (all labs ordered are listed, but only abnormal results are displayed) Labs Reviewed - No data to display  RADIOLOGY  I personally viewed and evaluated these images as part of my medical decision making, as well as reviewing the written report by the radiologist.  ED Provider Interpretation: No acute bony abnormality noted to hips, lumbar region or left shoulder  DG HIPS BILAT W OR W/O PELVIS 3-4 VIEWS Result Date: 12/12/2023 EXAM: 2 or 3 or 4 VIEW(S) XRAY OF THE PELVIS AND BILATERAL HIP 12/12/2023 05:08:21 PM COMPARISON: CT 07/30/2023. CLINICAL HISTORY: fall FINDINGS: JOINTS: SI joints are symmetric. 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 No acute fracture. SOFT TISSUES: The soft tissues are unremarkable. IMPRESSION: 1. No acute fracture. 2. Chronic superior  hip dislocations with dysplasia, unchanged since prior study. Electronically signed by: Franky Crease MD 12/12/2023 05:22 PM EST RP Workstation: [id number]   DG Lumbar Spine Complete Result Date: 12/12/2023 EXAM: 4 VIEW(S) XRAY OF THE LUMBAR SPINE 12/12/2023 05:08:21 PM COMPARISON: 12/13/2011 CLINICAL HISTORY: fall fall FINDINGS: LUMBAR SPINE: BONES: No acute fracture. No aggressive appearing osseous lesion. Alignment is normal. DISCS AND DEGENERATIVE CHANGES: No severe degenerative changes. SOFT TISSUES: No acute abnormality. IMPRESSION: 1. No acute abnormality of the lumbar spine. Electronically signed by: Franky Crease MD 12/12/2023 05:20 PM EST RP Workstation: [id number]   DG Shoulder Left Result Date: 12/12/2023 EXAM: 1 VIEW XRAY OF THE LEFT SHOULDER 12/12/2023 05:08:21 PM COMPARISON: 02/17/2021 CLINICAL HISTORY: fall fall FINDINGS: BONES AND JOINTS:  Moderate degenerative changes are present in the [facility] and glenohumeral joints, characterized by joint space narrowing and spurring. There is cystic formation at the rotator cuff insertion. No acute fracture or dislocation. SOFT TISSUES: No abnormal calcifications. Visualized lung is unremarkable. IMPRESSION: 1. No acute fracture or dislocation. 2. Moderate degenerative changes in the acromioclavicular and glenohumeral joints with joint space narrowing and spurring. 3. Cystic formation at the rotator cuff insertion. Electronically signed by: Franky Crease MD 12/12/2023 05:20 PM EST RP Workstation: [id number]    PROCEDURES:  Critical Care performed: No  Procedures   MEDICATIONS ORDERED IN ED: Medications  ketorolac  (TORADOL ) 30 MG/ML injection 30 mg (30 mg Intramuscular Given 12/12/23 1707)  acetaminophen  (TYLENOL ) tablet 650 mg (650 mg Oral Given 12/12/23 1708)     IMPRESSION / MDM / ASSESSMENT AND PLAN / ED COURSE  I reviewed the triage vital signs and the nursing notes.                             Clinical Course as of 12/12/23 1754  Wed Dec 12, 2023  1753 On reassessment patient reports pain has improved some following ED pain management [MH]    Clinical Course User Index [MH] Margrette, Johnwesley Lederman A, PA-C   43 y.o. male presents to the emergency department for evaluation and treatment of fall. See HPI for further details.   Differential diagnosis includes, but is not limited to fracture, dislocation, strain, spasm, contusion  Patient's presentation is most consistent with acute complicated illness / injury requiring diagnostic workup.  Patient is alert and oriented.  On initial assessment patient is well-appearing.  Initial vital signs are within normal limits.  Physical exam findings are stated above.  X-ray of bilateral hips reveal no acute bony abnormality.  Does note chronic superior hip dislocations with dysplasia but this is unchanged since prior study.  Lumbar x-ray is reassuring.   Left shoulder x-ray reveals no acute bony abnormality.  Noted cystic formation at the rotator cuff insertion.  Advised patient to follow-up with orthopedics for further evaluation.  He verbalized understanding.  Patient was offered crutches at discharge however patient declined and feels comfortable walking without the assistance.  He is in stable condition for discharge home.  ED return precaution discussed.   FINAL CLINICAL IMPRESSION(S) / ED DIAGNOSES   Final diagnoses:  Fall, initial encounter   Rx / DC Orders   ED Discharge Orders     None      Note:  This document was prepared using Dragon voice recognition software and may include unintentional dictation errors.    Margrette, Troye Hiemstra A, PA-C 12/12/23 1754    Arlander Charleston, MD 12/12/23 [phone]

## 2023-12-12 NOTE — ED Notes (Signed)
 Pt returned to room

## 2023-12-12 NOTE — ED Notes (Signed)
 Pt verbalizes understanding of discharge instructions. Opportunity for questioning and answers were provided. Pt discharged from ED to waiting room. First Nurse aware. Waiting on family.

## 2024-01-08 ENCOUNTER — Emergency Department
Admission: EM | Admit: 2024-01-08 | Discharge: 2024-01-08 | Disposition: A | Discharge status: Home / Self Care | Attending: Emergency Medicine | Admitting: Emergency Medicine

## 2024-01-08 ENCOUNTER — Emergency Department

## 2024-01-08 DIAGNOSIS — M545 Low back pain, unspecified: Secondary | ICD-10-CM | POA: Diagnosis present

## 2024-01-08 DIAGNOSIS — S300XXA Contusion of lower back and pelvis, initial encounter: Secondary | ICD-10-CM | POA: Diagnosis not present

## 2024-01-08 DIAGNOSIS — I110 Hypertensive heart disease with heart failure: Secondary | ICD-10-CM | POA: Insufficient documentation

## 2024-01-08 DIAGNOSIS — S20229A Contusion of unspecified back wall of thorax, initial encounter: Secondary | ICD-10-CM

## 2024-01-08 DIAGNOSIS — E119 Type 2 diabetes mellitus without complications: Secondary | ICD-10-CM | POA: Diagnosis not present

## 2024-01-08 DIAGNOSIS — W1830XA Fall on same level, unspecified, initial encounter: Secondary | ICD-10-CM | POA: Insufficient documentation

## 2024-01-08 DIAGNOSIS — I509 Heart failure, unspecified: Secondary | ICD-10-CM | POA: Diagnosis not present

## 2024-01-08 DIAGNOSIS — W19XXXA Unspecified fall, initial encounter: Secondary | ICD-10-CM

## 2024-01-08 MED ORDER — OXYCODONE-ACETAMINOPHEN 5-325 MG PO TABS
1.0000 | ORAL_TABLET | Freq: Once | ORAL | Status: AC
  Administered 2024-01-08: 1 via ORAL
  Filled 2024-01-08: qty 1

## 2024-01-08 MED ORDER — FENTANYL CITRATE (PF) 50 MCG/ML IJ SOSY
50.0000 ug | PREFILLED_SYRINGE | Freq: Once | INTRAMUSCULAR | Status: AC
  Administered 2024-01-08: 50 ug via INTRAMUSCULAR
  Filled 2024-01-08: qty 1

## 2024-01-08 MED ORDER — PREDNISONE 10 MG (21) PO TBPK: ORAL_TABLET | ORAL | 0 refills | Status: AC

## 2024-01-08 MED ORDER — CYCLOBENZAPRINE HCL 10 MG PO TABS: 10.0000 mg | ORAL_TABLET | Freq: Three times a day (TID) | ORAL | 0 refills | Status: AC | PRN

## 2024-01-08 NOTE — Discharge Instructions (Signed)
 Follow-up with your regular doctor if not improving to 3 days.  Return for worsening.  Take muscle relaxer as needed.

## 2024-01-08 NOTE — ED Provider Notes (Signed)
 [facility] Provider Note    Event Date/Time   First Cory Campbell Initiated Contact with Patient 01/08/24 1616     (approximate)   History   Fall   HPI  Cory Campbell is a 43 y.o. male history of arthrogryposis, hypertension, diverticulitis, BPH, diabetes, CHF presents emergency department complaining of a fall.  Patient arrived via EMS as he is wheelchair-bound.  States was transferring from his wheelchair and fell to the ground landing on his back.  Complaining of low back pain and mid back pain.  Denies numbness tingling or head injury.  No blood thinners.      Physical Exam   Triage Vital Signs: ED Triage Vitals [01/08/24 1601]  Encounter Vitals Group     BP (!) 150/113     Girls Systolic BP Percentile      Girls Diastolic BP Percentile      Boys Systolic BP Percentile      Boys Diastolic BP Percentile      Pulse Rate 82     Resp 18     Temp 98.1 F (36.7 C)     Temp Source Oral     SpO2 100 %     Weight 138 lb 14.2 oz (63 kg)     Height 5' 6 (1.676 m)     Head Circumference      Peak Flow      Pain Score 10     Pain Loc      Pain Education      Exclude from Growth Chart     Most recent vital signs: Vitals:   01/08/24 1601  BP: (!) 150/113  Pulse: 82  Resp: 18  Temp: 98.1 F (36.7 C)  SpO2: 100%     General: Awake, no distress.   CV:  Good peripheral perfusion.  Resp:  Normal effort. Abd:  No distention.   Other:  Lumbar spine T-spine tender to palpation   ED Results / Procedures / Treatments   Labs (all labs ordered are listed, but only abnormal results are displayed) Labs Reviewed - No data to display   EKG     RADIOLOGY CT lumbar spine and T-spine    PROCEDURES:   Procedures  Critical Care:  no Chief Complaint  Patient presents with   Fall      MEDICATIONS ORDERED IN ED: Medications  fentaNYL  (SUBLIMAZE ) injection 50 mcg (50 mcg Intramuscular Given 01/08/24 1642)  oxyCODONE -acetaminophen   (PERCOCET/ROXICET) 5-325 MG per tablet 1 tablet (1 tablet Oral Given 01/08/24 1749)     IMPRESSION / MDM / ASSESSMENT AND PLAN / ED COURSE  I reviewed the triage vital signs and the nursing notes.                              Differential diagnosis includes, but is not limited to, fall, fracture, contusion, strain  Patient's presentation is most consistent with acute illness / injury with system symptoms.   Medications given: Fentanyl  50 mcg IM  CT lumbar spine and T-spine  CT lumbar spine T-spine, independent review interpretation of me by reading radiologist report is no acute abnormality.  Chronic findings noted.  Did explain findings to patient.  He does understand he has a lot of chronic findings due to his musculoskeletal disorder.  He is on Suboxone so will not give him a narcotic.  He was given pain medication while here in the ED.  He will  be given a prescription for steroids pack, Flexeril  10 mg.  Follow-up with his regular doctor if not improving in 2 to 3 days.  Return if worsening.  He is in agreement treatment plan.  Discharged stable condition.      FINAL CLINICAL IMPRESSION(S) / ED DIAGNOSES   Final diagnoses:  Fall, initial encounter  Back contusion, unspecified laterality, initial encounter     Rx / DC Orders   ED Discharge Orders          Ordered    predniSONE  (STERAPRED UNI-PAK 21 TAB) 10 MG (21) TBPK tablet        01/08/24 1832    cyclobenzaprine  (FLEXERIL ) 10 MG tablet  3 times daily PRN        01/08/24 1832             Note:  This document was prepared using Dragon voice recognition software and may include unintentional dictation errors.    Cory Devere Campbell, Cory Campbell 01/08/24 Cory Campbell    Cory Kirsch, Cory Campbell 01/08/24 2154

## 2024-01-08 NOTE — ED Triage Notes (Signed)
 Pt presents to the ED via [facility] from home. Pt reports that he fell while transferring himself yesterday. Reports mid back pain and left shoulder pain from the fall. Denies LOC. Denies hitting head. Denies numbness or tingling. Pt wheelchair bound at baseline

## 2024-01-08 NOTE — ED Notes (Signed)
 Pt is ANOx4 and stated he is discharging with friend, Jodi Thompson

## 2024-01-08 NOTE — ED Notes (Signed)
 Spoke with Cordelia Pouch about picking pt up. Ms. Pouch stated they were going to call neighbors and see if they could pick pt up

## 2024-01-12 NOTE — Progress Notes (Unsigned)
 Cardiology Clinic Note   Date: 01/12/2024 ID: ATTICUS WEDIN, DOB 17-Aug-1980, MRN [id number]  Primary Cardiologist:  Evalene Lunger, MD  Chief Complaint   Cory Campbell is a 43 y.o. male who presents to the clinic today for ***  Patient Profile   Cory Campbell is followed by *** for the history outlined below.      Past medical history significant for: Chronic diastolic heart failure. Echo 10/08/2016: EF 50 to 55%.  Distal septal apical hypokinesis.  No significant valvular abnormalities. Palpitations. 48-hour Holter 02/02/2017: Normal sinus rhythm with rare PVCs. Hypertension. Hyperlipidemia. Lipid panel 02/08/2023: LDL 35, HDL 20, TG 386, total 111. Arthrogryposis multiplex congenita.  Asthma. OSA. On CPAP. Colitis. Pancreatitis. HIV. IDDM. CKD.   In summary, patient was initially evaluated by Dr. Gollan in May 2013 for chest pain after several ED evaluations.  He underwent stress testing significant GI uptake artifact, no evidence of ischemia, EF 56%, low risk stress test.  Echo September 2018 showed normal LV function as above.  48-hour Holter monitor in December 2018 showed normal sinus rhythm with rare PVCs.  Patient underwent hospital admission in January 2024 and was started on dialysis for AKI.  He followed-up in the office March 2024 and reported doing well.  Repeat echo was offered but patient declined at that time.   Patient was seen in the clinic on 01/29/2023 for routine follow-up.  He reported occasional palpitations typically at night that were not bothersome to him.  He had no other cardiac complaints and no medication changes were made.   Patient was last seen in the office by me on 07/06/2023 for routine follow-up.  He was doing well at that time.  He reported dyspnea with heavy exertion such as rushing around when using his crutches.  He uses his motorized wheelchair about public and will use crutches around his home.  BP was well-controlled at the time  of this visit.  No medication changes were made.  Patient with 2 recent ED visits in early November and early December for falls.     History of Present Illness    Today, patient ***  Chronic diastolic heart failure Echo September 2018 showed normal LV/RV function with no significant valvular abnormalities.  Patient***.  Euvolemic and well compensated on exam. - Continue Toprol , lisinopril , isosorbide  dinitrate, hydralazine .   Palpitations Patient reports palpitations described as hard beats that typically occur at night with no associated symptoms. They are not bothersome to him.*** - Continue Toprol . - Instructed to contact the office if they become more frequent or sustained.    Hypertension BP today***.  - Continue clonidine , hydralazine , isosorbide  dinitrate, Toprol .   Hyperlipidemia/hypertriglyceridemia LDL 35, TG 386 January 2025, LDL at goal. - Continue rosuvastatin  and Lovaza .  Frequent falls Patient*** -***  ROS: All other systems reviewed and are otherwise negative except as noted in History of Present Illness.  EKGs/Labs Reviewed        07/30/2023: ALT 10; AST 14 10/31/2023: BUN 24; Creatinine, Ser 1.52; Potassium 3.8; Sodium 133   10/31/2023: Hemoglobin 13.8; WBC 6.3   No results found for requested labs within last 365 days.   07/30/2023: B Natriuretic Peptide 16.6  ***  Risk Assessment/Calculations    {Does this patient have ATRIAL FIBRILLATION?:[phone]} No BP recorded.  {Refresh Note OR Click here to enter BP  :1}***        Physical Exam    VS:  There were no vitals taken for this visit. , BMI  There is no height or weight on file to calculate BMI.  GEN: Well nourished, well developed, in no acute distress. Neck: No JVD or carotid bruits. Cardiac: *** RRR. *** No murmur. No rubs or gallops.   Respiratory:  Respirations regular and unlabored. Clear to auscultation without rales, wheezing or rhonchi. GI: Soft, nontender,  nondistended. Extremities: Radials/DP/PT 2+ and equal bilaterally. No clubbing or cyanosis. No edema ***  Skin: Warm and dry, no rash. Neuro: Strength intact.  Assessment & Plan   ***  Disposition: ***     {Are you ordering a CV Procedure (e.g. stress test, cath, DCCV, TEE, etc)?   Press F2        :[id number]}   Signed, Barnie HERO. Jamielee Mchale, DNP, NP-C

## 2024-01-16 ENCOUNTER — Ambulatory Visit: Attending: Student | Admitting: Student

## 2024-01-16 ENCOUNTER — Encounter: Payer: Self-pay | Admitting: Student

## 2024-01-16 VITALS — BP 136/84 | HR 115 | Resp 19 | Ht 66.0 in | Wt 167.0 lb

## 2024-01-16 DIAGNOSIS — I5032 Chronic diastolic (congestive) heart failure: Secondary | ICD-10-CM | POA: Insufficient documentation

## 2024-01-16 DIAGNOSIS — I10 Essential (primary) hypertension: Secondary | ICD-10-CM | POA: Diagnosis present

## 2024-01-16 DIAGNOSIS — E782 Mixed hyperlipidemia: Secondary | ICD-10-CM | POA: Insufficient documentation

## 2024-01-16 DIAGNOSIS — R002 Palpitations: Secondary | ICD-10-CM | POA: Insufficient documentation

## 2024-01-16 DIAGNOSIS — R296 Repeated falls: Secondary | ICD-10-CM | POA: Insufficient documentation

## 2024-01-16 NOTE — Patient Instructions (Addendum)
 Medication Instructions:   Your physician recommends that you continue on your current medications as directed. Please refer to the Current Medication list given to you today.    *If you need a refill on your cardiac medications before your next appointment, please call your pharmacy*  Lab Work:  None ordered at this time   If you have labs (blood work) drawn today and your tests are completely normal, you will receive your results only by:  MyChart Message (if you have MyChart) OR  A paper copy in the mail If you have any lab test that is abnormal or we need to change your treatment, we will call you to review the results.  Testing/Procedures:  None ordered at this time   Referrals:  None ordered at this time   Follow-Up:  At [facility], you and your health needs are our priority.  As part of our continuing mission to provide you with exceptional heart care, our providers are all part of one team.  This team includes your primary Cardiologist (physician) and Advanced Practice Providers or APPs (Physician Assistants and Nurse Practitioners) who all work together to provide you with the care you need, when you need it.  Your next appointment:   5 - 6 month(s)  Provider:    You may see Timothy Gollan, MD or one of the following Advanced Practice Providers on your designated Care Team:   Lonni Meager, NP Lesley Maffucci, PA-C Bernardino Bring, PA-C Cadence Bushnell, PA-C Tylene Lunch, NP Barnie Hila, NP    We recommend signing up for the patient portal called MyChart.  Sign up information is provided on this After Visit Summary.  MyChart is used to connect with patients for Virtual Visits (Telemedicine).  Patients are able to view lab/test results, encounter notes, upcoming appointments, etc.  Non-urgent messages can be sent to your provider as well.   To learn more about what you can do with MyChart, go to [email or web address].

## 2024-03-14 ENCOUNTER — Emergency Department
Admission: EM | Admit: 2024-03-14 | Discharge: 2024-03-14 | Disposition: A | Discharge status: Home / Self Care | Source: Home / Self Care | Attending: Emergency Medicine | Admitting: Emergency Medicine

## 2024-03-14 ENCOUNTER — Other Ambulatory Visit: Payer: Self-pay

## 2024-03-14 DIAGNOSIS — R0981 Nasal congestion: Secondary | ICD-10-CM

## 2024-03-14 DIAGNOSIS — M79604 Pain in right leg: Secondary | ICD-10-CM

## 2024-03-14 MED ORDER — CYCLOBENZAPRINE HCL 10 MG PO TABS
5.0000 mg | ORAL_TABLET | Freq: Once | ORAL | Status: AC
  Administered 2024-03-14: 5 mg via ORAL
  Filled 2024-03-14: qty 1

## 2024-03-14 MED ORDER — ACETAMINOPHEN 500 MG PO TABS
1000.0000 mg | ORAL_TABLET | Freq: Once | ORAL | Status: AC
  Administered 2024-03-14: 1000 mg via ORAL
  Filled 2024-03-14: qty 2

## 2024-03-14 NOTE — ED Triage Notes (Signed)
 Pt presents for right leg pain. Atraumatic. Also endorsing a sinus infection (diagnosed but not improving)  148/93, BGL 245 (baseline), HR 102, afebrile

## 2024-03-14 NOTE — Discharge Instructions (Addendum)
 Please continue taking your regular medications.  Sinus infections often take a long time to get better so there is not much else to do at this time.  Follow-up with your regular doctor at the next available opportunity.

## 2024-03-14 NOTE — ED Provider Notes (Signed)
 "  [facility] Provider Note    Event Date/Time   First MD Initiated Contact with Patient 03/14/24 [phone]     (approximate)   History   Leg Pain   HPI Cory Campbell is a 44 y.o. male who is well-known to the emergency department with frequent visits.  He is wheelchair-bound secondary to arthrogryposis with contractures and functional paraplegia and he suffers from chronic pain syndrome on Suboxone.  He presents by EMS tonight for right leg pain.  He has not had any recent falls or injuries and said that his leg was hurting in the right thigh more than usual.  It feels like cramping.  He is also concerned because he has nasal congestion and was told he has a sinus infection but it is not getting better.  He has no fever and is not having any trouble breathing.     Physical Exam   Triage Vital Signs: ED Triage Vitals [03/14/24 0522]  Encounter Vitals Group     BP (!) 141/81     Girls Systolic BP Percentile      Girls Diastolic BP Percentile      Boys Systolic BP Percentile      Boys Diastolic BP Percentile      Pulse Rate 74     Resp 20     Temp 97.8 F (36.6 C)     Temp Source Oral     SpO2 95 %     Weight      Height      Head Circumference      Peak Flow      Pain Score 8     Pain Loc      Pain Education      Exclude from Growth Chart     Most recent vital signs: Vitals:   03/14/24 0522  BP: (!) 141/81  Pulse: 74  Resp: 20  Temp: 97.8 F (36.6 C)  SpO2: 95%    General: Awake, no distress.  Seems to be at his baseline, resting comfortably.  Some nasal congestion but minimal. CV:  Good peripheral perfusion.  Resp:  Normal effort. Speaking easily and comfortably, no accessory muscle usage nor intercostal retractions.   Abd:  No distention.  Other:  Chronically contractured legs.  Soft compartments, no reproducible tenderness to palpation of the thigh nor of the calf.  No joint effusions.   ED Results / Procedures / Treatments    Labs (all labs ordered are listed, but only abnormal results are displayed) Labs Reviewed - No data to display    PROCEDURES:  Critical Care performed: No  Procedures    IMPRESSION / MDM / ASSESSMENT AND PLAN / ED COURSE  I reviewed the triage vital signs and the nursing notes.                              Differential diagnosis includes, but is not limited to, chronic pain, muscle cramping, less likely fracture or dislocation or compartment syndrome.  Patient's presentation is most consistent with exacerbation of chronic illness.  Interventions/Medications given:  Medications  acetaminophen  (TYLENOL ) tablet 1,000 mg (1,000 mg Oral Given 03/14/24 0625)  cyclobenzaprine  (FLEXERIL ) tablet 5 mg (5 mg Oral Given 03/14/24 9374)    (Note:  hospital course my include additional interventions and/or labs/studies not listed above.)   Vital signs are stable and physical exam is reassuring.  Patient is having chronic pain.  I am providing a dose of Flexeril  which she has had in the past as well as 1000 mg of Tylenol .  I explained that almost all sinus infections are viral and he just needs more time to recover and can continue taking the regular medication he is on.  He is also recently prescribed prednisone .  He will follow-up as an outpatient.  The patient's medical screening exam is reassuring with no indication of an emergent medical condition requiring hospitalization or additional evaluation at this point.  The patient is safe and appropriate for discharge and outpatient follow up.       FINAL CLINICAL IMPRESSION(S) / ED DIAGNOSES   Final diagnoses:  Right leg pain  Nasal congestion     Rx / DC Orders   ED Discharge Orders     None        Note:  This document was prepared using Dragon voice recognition software and may include unintentional dictation errors.   Gordan Huxley, MD 03/14/24 [phone]  "

## 2024-06-20 ENCOUNTER — Ambulatory Visit: Admitting: Student
